# Patient Record
Sex: Female | Born: 1940 | Race: White | Hispanic: No | Marital: Married | Smoking: Former smoker
Health system: Southern US, Community
[De-identification: ages and names within clinical notes are randomized; demographics above are authoritative.]

## PROBLEM LIST (undated history)

## (undated) DIAGNOSIS — M169 Osteoarthritis of hip, unspecified: Secondary | ICD-10-CM

## (undated) DIAGNOSIS — Z5189 Encounter for other specified aftercare: Secondary | ICD-10-CM

## (undated) DIAGNOSIS — F411 Generalized anxiety disorder: Secondary | ICD-10-CM

## (undated) DIAGNOSIS — Z8679 Personal history of other diseases of the circulatory system: Secondary | ICD-10-CM

## (undated) DIAGNOSIS — K635 Polyp of colon: Secondary | ICD-10-CM

## (undated) DIAGNOSIS — M6281 Muscle weakness (generalized): Secondary | ICD-10-CM

## (undated) DIAGNOSIS — L309 Dermatitis, unspecified: Secondary | ICD-10-CM

## (undated) DIAGNOSIS — K649 Unspecified hemorrhoids: Secondary | ICD-10-CM

## (undated) DIAGNOSIS — R109 Unspecified abdominal pain: Secondary | ICD-10-CM

## (undated) DIAGNOSIS — S22000A Wedge compression fracture of unspecified thoracic vertebra, initial encounter for closed fracture: Secondary | ICD-10-CM

## (undated) DIAGNOSIS — J189 Pneumonia, unspecified organism: Secondary | ICD-10-CM

## (undated) DIAGNOSIS — Z9981 Dependence on supplemental oxygen: Secondary | ICD-10-CM

## (undated) DIAGNOSIS — J449 Chronic obstructive pulmonary disease, unspecified: Principal | ICD-10-CM

## (undated) DIAGNOSIS — M76899 Other specified enthesopathies of unspecified lower limb, excluding foot: Secondary | ICD-10-CM

## (undated) DIAGNOSIS — I2510 Atherosclerotic heart disease of native coronary artery without angina pectoris: Secondary | ICD-10-CM

## (undated) DIAGNOSIS — L719 Rosacea, unspecified: Secondary | ICD-10-CM

## (undated) DIAGNOSIS — M545 Low back pain: Secondary | ICD-10-CM

## (undated) DIAGNOSIS — R51 Headache: Secondary | ICD-10-CM

## (undated) DIAGNOSIS — R55 Syncope and collapse: Secondary | ICD-10-CM

## (undated) DIAGNOSIS — M810 Age-related osteoporosis without current pathological fracture: Secondary | ICD-10-CM

## (undated) DIAGNOSIS — I10 Essential (primary) hypertension: Secondary | ICD-10-CM

## (undated) DIAGNOSIS — F329 Major depressive disorder, single episode, unspecified: Secondary | ICD-10-CM

## (undated) DIAGNOSIS — R7302 Impaired glucose tolerance (oral): Secondary | ICD-10-CM

## (undated) HISTORY — DX: Osteoarthritis of hip, unspecified: M16.9

## (undated) HISTORY — DX: Unspecified abdominal pain: R10.9

## (undated) HISTORY — DX: Chronic obstructive pulmonary disease, unspecified: J44.9

## (undated) HISTORY — DX: Polyp of colon: K63.5

## (undated) HISTORY — DX: Rosacea, unspecified: L71.9

## (undated) HISTORY — PX: CORONARY STENT PLACEMENT: SHX1402

## (undated) HISTORY — PX: KYPHOPLASTY: SHX5884

## (undated) HISTORY — DX: Impaired glucose tolerance (oral): R73.02

## (undated) HISTORY — DX: Muscle weakness (generalized): M62.81

## (undated) HISTORY — DX: Major depressive disorder, single episode, unspecified: F32.9

## (undated) HISTORY — DX: Headache: R51

## (undated) HISTORY — DX: Low back pain: M54.5

## (undated) HISTORY — DX: Encounter for other specified aftercare: Z51.89

## (undated) HISTORY — DX: Pneumonia, unspecified organism: J18.9

## (undated) HISTORY — DX: Generalized anxiety disorder: F41.1

## (undated) HISTORY — DX: Personal history of other diseases of the circulatory system: Z86.79

## (undated) HISTORY — PX: OOPHORECTOMY: SHX86

## (undated) HISTORY — PX: ABDOMINAL HYSTERECTOMY: SHX81

## (undated) HISTORY — DX: Dermatitis, unspecified: L30.9

## (undated) HISTORY — DX: Syncope and collapse: R55

## (undated) HISTORY — PX: BACK SURGERY: SHX140

## (undated) HISTORY — PX: CORONARY ANGIOPLASTY: SHX604

## (undated) HISTORY — DX: Other specified enthesopathies of unspecified lower limb, excluding foot: M76.899

## (undated) HISTORY — DX: Unspecified hemorrhoids: K64.9

## (undated) HISTORY — DX: Wedge compression fracture of unspecified thoracic vertebra, initial encounter for closed fracture: S22.000A

## (undated) HISTORY — PX: APPENDECTOMY: SHX54

## (undated) HISTORY — PX: OTHER SURGICAL HISTORY: SHX169

## (undated) HISTORY — PX: BREAST ENHANCEMENT SURGERY: SHX7

## (undated) HISTORY — DX: Age-related osteoporosis without current pathological fracture: M81.0

## (undated) HISTORY — PX: HIP SURGERY: SHX245

## (undated) HISTORY — DX: Essential (primary) hypertension: I10

---

## 1996-05-24 DIAGNOSIS — J189 Pneumonia, unspecified organism: Secondary | ICD-10-CM

## 1996-05-24 HISTORY — DX: Pneumonia, unspecified organism: J18.9

## 2000-02-29 ENCOUNTER — Emergency Department (HOSPITAL_COMMUNITY): Admission: EM | Admit: 2000-02-29 | Discharge: 2000-02-29 | Payer: Self-pay | Admitting: Emergency Medicine

## 2000-02-29 ENCOUNTER — Encounter: Payer: Self-pay | Admitting: Emergency Medicine

## 2000-03-21 ENCOUNTER — Ambulatory Visit (HOSPITAL_COMMUNITY): Admission: RE | Admit: 2000-03-21 | Discharge: 2000-03-21 | Payer: Self-pay | Admitting: Neurosurgery

## 2000-03-21 ENCOUNTER — Encounter: Payer: Self-pay | Admitting: Neurosurgery

## 2000-06-27 ENCOUNTER — Ambulatory Visit (HOSPITAL_COMMUNITY): Admission: RE | Admit: 2000-06-27 | Discharge: 2000-06-27 | Payer: Self-pay | Admitting: Internal Medicine

## 2001-02-22 ENCOUNTER — Ambulatory Visit (HOSPITAL_COMMUNITY): Admission: RE | Admit: 2001-02-22 | Discharge: 2001-02-22 | Payer: Self-pay | Admitting: Internal Medicine

## 2001-06-15 ENCOUNTER — Ambulatory Visit (HOSPITAL_COMMUNITY): Admission: RE | Admit: 2001-06-15 | Discharge: 2001-06-15 | Payer: Self-pay | Admitting: Orthopedic Surgery

## 2001-06-15 ENCOUNTER — Encounter: Payer: Self-pay | Admitting: Orthopedic Surgery

## 2001-07-27 ENCOUNTER — Observation Stay (HOSPITAL_COMMUNITY): Admission: RE | Admit: 2001-07-27 | Discharge: 2001-07-28 | Payer: Self-pay | Admitting: Orthopedic Surgery

## 2001-07-27 ENCOUNTER — Encounter: Payer: Self-pay | Admitting: Orthopedic Surgery

## 2001-08-17 ENCOUNTER — Inpatient Hospital Stay (HOSPITAL_COMMUNITY): Admission: EM | Admit: 2001-08-17 | Discharge: 2001-08-20 | Payer: Self-pay | Admitting: Emergency Medicine

## 2001-08-17 ENCOUNTER — Encounter: Payer: Self-pay | Admitting: Orthopedic Surgery

## 2001-10-14 ENCOUNTER — Encounter: Payer: Self-pay | Admitting: Medical Genetics

## 2001-10-14 ENCOUNTER — Ambulatory Visit (HOSPITAL_COMMUNITY): Admission: RE | Admit: 2001-10-14 | Discharge: 2001-10-14 | Payer: Self-pay | Admitting: Medical Genetics

## 2002-01-09 ENCOUNTER — Ambulatory Visit (HOSPITAL_COMMUNITY): Admission: RE | Admit: 2002-01-09 | Discharge: 2002-01-09 | Payer: Self-pay | Admitting: Internal Medicine

## 2002-01-09 ENCOUNTER — Encounter: Payer: Self-pay | Admitting: Internal Medicine

## 2002-01-25 HISTORY — PX: COLONOSCOPY: SHX174

## 2003-01-11 ENCOUNTER — Ambulatory Visit (HOSPITAL_COMMUNITY): Admission: RE | Admit: 2003-01-11 | Discharge: 2003-01-11 | Payer: Self-pay | Admitting: Internal Medicine

## 2004-05-27 ENCOUNTER — Ambulatory Visit: Payer: Self-pay | Admitting: Internal Medicine

## 2005-02-05 ENCOUNTER — Ambulatory Visit: Payer: Self-pay | Admitting: Internal Medicine

## 2005-02-17 ENCOUNTER — Ambulatory Visit: Payer: Self-pay | Admitting: Internal Medicine

## 2005-02-17 HISTORY — PX: COLONOSCOPY: SHX174

## 2005-05-31 ENCOUNTER — Ambulatory Visit: Payer: Self-pay | Admitting: Internal Medicine

## 2005-06-12 ENCOUNTER — Ambulatory Visit: Payer: Self-pay | Admitting: Internal Medicine

## 2005-06-12 ENCOUNTER — Inpatient Hospital Stay (HOSPITAL_COMMUNITY): Admission: EM | Admit: 2005-06-12 | Discharge: 2005-06-15 | Payer: Self-pay | Admitting: *Deleted

## 2005-06-12 ENCOUNTER — Ambulatory Visit: Payer: Self-pay | Admitting: Cardiology

## 2005-06-14 ENCOUNTER — Encounter: Payer: Self-pay | Admitting: Cardiology

## 2005-07-08 ENCOUNTER — Ambulatory Visit: Payer: Self-pay | Admitting: Internal Medicine

## 2005-10-28 ENCOUNTER — Ambulatory Visit: Payer: Self-pay | Admitting: Internal Medicine

## 2005-11-18 ENCOUNTER — Ambulatory Visit: Payer: Self-pay | Admitting: Internal Medicine

## 2006-06-14 ENCOUNTER — Ambulatory Visit: Payer: Self-pay | Admitting: Internal Medicine

## 2006-06-14 ENCOUNTER — Ambulatory Visit (HOSPITAL_COMMUNITY): Admission: RE | Admit: 2006-06-14 | Discharge: 2006-06-14 | Payer: Self-pay | Admitting: Internal Medicine

## 2006-06-20 ENCOUNTER — Ambulatory Visit: Payer: Self-pay | Admitting: Internal Medicine

## 2006-06-20 ENCOUNTER — Ambulatory Visit (HOSPITAL_COMMUNITY): Admission: RE | Admit: 2006-06-20 | Discharge: 2006-06-20 | Payer: Self-pay | Admitting: Internal Medicine

## 2006-06-20 LAB — CONVERTED CEMR LAB
ALT: 18 units/L (ref 0–40)
CO2: 31 meq/L (ref 19–32)
Calcium: 8.8 mg/dL (ref 8.4–10.5)
Chloride: 107 meq/L (ref 96–112)
Cholesterol: 235 mg/dL (ref 0–200)
Eosinophils Absolute: 0.3 10*3/uL (ref 0.0–0.6)
Eosinophils Relative: 4.1 % (ref 0.0–5.0)
Glucose, Bld: 91 mg/dL (ref 70–99)
Hemoglobin: 13.4 g/dL (ref 12.0–15.0)
Leukocytes, UA: NEGATIVE
MCV: 82.4 fL (ref 78.0–100.0)
Monocytes Absolute: 0.5 10*3/uL (ref 0.2–0.7)
Monocytes Relative: 7.6 % (ref 3.0–11.0)
Neutro Abs: 2.4 10*3/uL (ref 1.4–7.7)
Neutrophils Relative %: 39 % — ABNORMAL LOW (ref 43.0–77.0)
RBC: 4.69 M/uL (ref 3.87–5.11)
RDW: 13 % (ref 11.5–14.6)
Sodium: 143 meq/L (ref 135–145)
Total CHOL/HDL Ratio: 4.8
Total Protein, Urine: NEGATIVE mg/dL
Total Protein: 6.2 g/dL (ref 6.0–8.3)
Triglycerides: 320 mg/dL (ref 0–149)
Urine Glucose: NEGATIVE mg/dL
Urobilinogen, UA: 0.2 (ref 0.0–1.0)

## 2006-07-05 ENCOUNTER — Ambulatory Visit: Payer: Self-pay | Admitting: Internal Medicine

## 2006-07-11 ENCOUNTER — Ambulatory Visit: Payer: Self-pay | Admitting: Internal Medicine

## 2006-08-01 ENCOUNTER — Ambulatory Visit: Payer: Self-pay | Admitting: Internal Medicine

## 2007-01-01 DIAGNOSIS — F411 Generalized anxiety disorder: Secondary | ICD-10-CM

## 2007-01-01 DIAGNOSIS — M545 Low back pain, unspecified: Secondary | ICD-10-CM

## 2007-01-01 DIAGNOSIS — I10 Essential (primary) hypertension: Secondary | ICD-10-CM

## 2007-01-01 DIAGNOSIS — F418 Other specified anxiety disorders: Secondary | ICD-10-CM

## 2007-01-01 DIAGNOSIS — M810 Age-related osteoporosis without current pathological fracture: Secondary | ICD-10-CM | POA: Insufficient documentation

## 2007-01-01 DIAGNOSIS — J439 Emphysema, unspecified: Secondary | ICD-10-CM | POA: Insufficient documentation

## 2007-01-01 DIAGNOSIS — R55 Syncope and collapse: Secondary | ICD-10-CM

## 2007-01-01 DIAGNOSIS — R51 Headache: Secondary | ICD-10-CM

## 2007-01-01 DIAGNOSIS — E785 Hyperlipidemia, unspecified: Secondary | ICD-10-CM | POA: Insufficient documentation

## 2007-01-01 DIAGNOSIS — Z8679 Personal history of other diseases of the circulatory system: Secondary | ICD-10-CM | POA: Insufficient documentation

## 2007-01-01 DIAGNOSIS — F329 Major depressive disorder, single episode, unspecified: Secondary | ICD-10-CM

## 2007-01-01 DIAGNOSIS — J449 Chronic obstructive pulmonary disease, unspecified: Secondary | ICD-10-CM

## 2007-01-01 DIAGNOSIS — J4489 Other specified chronic obstructive pulmonary disease: Secondary | ICD-10-CM

## 2007-01-01 DIAGNOSIS — R519 Headache, unspecified: Secondary | ICD-10-CM | POA: Insufficient documentation

## 2007-01-01 DIAGNOSIS — F3289 Other specified depressive episodes: Secondary | ICD-10-CM

## 2007-01-01 HISTORY — DX: Chronic obstructive pulmonary disease, unspecified: J44.9

## 2007-01-01 HISTORY — DX: Generalized anxiety disorder: F41.1

## 2007-01-01 HISTORY — DX: Other specified chronic obstructive pulmonary disease: J44.89

## 2007-01-01 HISTORY — DX: Age-related osteoporosis without current pathological fracture: M81.0

## 2007-01-01 HISTORY — DX: Essential (primary) hypertension: I10

## 2007-01-01 HISTORY — DX: Low back pain, unspecified: M54.50

## 2007-01-01 HISTORY — DX: Other specified depressive episodes: F32.89

## 2007-01-01 HISTORY — DX: Major depressive disorder, single episode, unspecified: F32.9

## 2007-01-01 HISTORY — DX: Headache: R51

## 2007-01-01 HISTORY — DX: Personal history of other diseases of the circulatory system: Z86.79

## 2007-01-01 HISTORY — DX: Syncope and collapse: R55

## 2007-07-28 ENCOUNTER — Ambulatory Visit (HOSPITAL_COMMUNITY): Admission: RE | Admit: 2007-07-28 | Discharge: 2007-07-28 | Payer: Self-pay | Admitting: Internal Medicine

## 2007-08-17 ENCOUNTER — Ambulatory Visit: Payer: Self-pay | Admitting: Internal Medicine

## 2007-08-17 DIAGNOSIS — E739 Lactose intolerance, unspecified: Secondary | ICD-10-CM

## 2007-08-18 LAB — CONVERTED CEMR LAB
AST: 21 units/L (ref 0–37)
Albumin: 3.4 g/dL — ABNORMAL LOW (ref 3.5–5.2)
BUN: 5 mg/dL — ABNORMAL LOW (ref 6–23)
Basophils Relative: 0.6 % (ref 0.0–1.0)
Bilirubin Urine: NEGATIVE
Bilirubin, Direct: 0.1 mg/dL (ref 0.0–0.3)
Calcium: 8.7 mg/dL (ref 8.4–10.5)
Chloride: 106 meq/L (ref 96–112)
Cholesterol: 282 mg/dL (ref 0–200)
Creatinine, Ser: 0.8 mg/dL (ref 0.4–1.2)
Eosinophils Absolute: 0.3 10*3/uL (ref 0.0–0.6)
GFR calc Af Amer: 92 mL/min
GFR calc non Af Amer: 76 mL/min
HCT: 38.8 % (ref 36.0–46.0)
HDL: 51.3 mg/dL (ref >39.0)
Hemoglobin: 12.8 g/dL (ref 12.0–15.0)
Lymphocytes Relative: 44 % (ref 12.0–46.0)
MCHC: 33 g/dL (ref 30.0–36.0)
MCV: 81.5 fL (ref 78.0–100.0)
Monocytes Absolute: 0.5 10*3/uL (ref 0.1–1.0)
Neutro Abs: 3 10*3/uL (ref 1.4–7.7)
Neutrophils Relative %: 43.6 % (ref 43.0–77.0)
Platelets: 284 10*3/uL (ref 150–400)
Potassium: 4 meq/L (ref 3.5–5.1)
RBC: 4.76 M/uL (ref 3.87–5.11)
RDW: 12.5 % (ref 11.5–14.6)
Sodium: 143 meq/L (ref 135–145)
TSH: 1.9 microintl units/mL (ref 0.35–5.50)
Total Bilirubin: 0.4 mg/dL (ref 0.3–1.2)
Total Protein: 6.3 g/dL (ref 6.0–8.3)
Urine Glucose: NEGATIVE mg/dL
Urobilinogen, UA: 0.2 (ref 0.0–1.0)
VLDL: 75 mg/dL — ABNORMAL HIGH (ref 0–40)

## 2007-09-13 ENCOUNTER — Telehealth (INDEPENDENT_AMBULATORY_CARE_PROVIDER_SITE_OTHER): Payer: Self-pay | Admitting: *Deleted

## 2008-03-19 ENCOUNTER — Telehealth (INDEPENDENT_AMBULATORY_CARE_PROVIDER_SITE_OTHER): Payer: Self-pay | Admitting: *Deleted

## 2008-03-22 ENCOUNTER — Ambulatory Visit: Payer: Self-pay | Admitting: Internal Medicine

## 2008-05-02 ENCOUNTER — Ambulatory Visit: Payer: Self-pay | Admitting: Internal Medicine

## 2008-05-24 HISTORY — PX: OTHER SURGICAL HISTORY: SHX169

## 2008-06-20 ENCOUNTER — Ambulatory Visit: Payer: Self-pay | Admitting: Internal Medicine

## 2008-06-20 DIAGNOSIS — R109 Unspecified abdominal pain: Secondary | ICD-10-CM

## 2008-06-20 HISTORY — DX: Unspecified abdominal pain: R10.9

## 2008-06-21 ENCOUNTER — Telehealth: Payer: Self-pay | Admitting: Internal Medicine

## 2008-06-24 ENCOUNTER — Telehealth (INDEPENDENT_AMBULATORY_CARE_PROVIDER_SITE_OTHER): Payer: Self-pay | Admitting: *Deleted

## 2008-07-01 ENCOUNTER — Telehealth (INDEPENDENT_AMBULATORY_CARE_PROVIDER_SITE_OTHER): Payer: Self-pay | Admitting: *Deleted

## 2008-07-01 DIAGNOSIS — M1610 Unilateral primary osteoarthritis, unspecified hip: Secondary | ICD-10-CM

## 2008-07-01 DIAGNOSIS — M169 Osteoarthritis of hip, unspecified: Secondary | ICD-10-CM

## 2008-07-01 HISTORY — DX: Unilateral primary osteoarthritis, unspecified hip: M16.10

## 2008-07-01 HISTORY — DX: Osteoarthritis of hip, unspecified: M16.9

## 2008-07-18 ENCOUNTER — Encounter: Admission: RE | Admit: 2008-07-18 | Discharge: 2008-07-18 | Payer: Self-pay | Admitting: Sports Medicine

## 2008-07-19 ENCOUNTER — Ambulatory Visit: Payer: Self-pay | Admitting: Internal Medicine

## 2008-07-19 LAB — CONVERTED CEMR LAB
AST: 20 units/L (ref 0–37)
Albumin: 3.5 g/dL (ref 3.5–5.2)
Bilirubin, Direct: 0.1 mg/dL (ref 0.0–0.3)
Cholesterol: 166 mg/dL (ref 0–200)

## 2008-08-08 ENCOUNTER — Ambulatory Visit: Payer: Self-pay | Admitting: Family Medicine

## 2008-08-08 ENCOUNTER — Encounter: Payer: Self-pay | Admitting: Internal Medicine

## 2009-01-07 ENCOUNTER — Encounter (INDEPENDENT_AMBULATORY_CARE_PROVIDER_SITE_OTHER): Payer: Self-pay | Admitting: Internal Medicine

## 2009-01-07 ENCOUNTER — Inpatient Hospital Stay (HOSPITAL_COMMUNITY): Admission: EM | Admit: 2009-01-07 | Discharge: 2009-01-09 | Payer: Self-pay | Admitting: Emergency Medicine

## 2009-01-07 ENCOUNTER — Ambulatory Visit: Payer: Self-pay | Admitting: Internal Medicine

## 2009-01-15 ENCOUNTER — Ambulatory Visit: Payer: Self-pay

## 2009-01-15 ENCOUNTER — Ambulatory Visit: Payer: Self-pay | Admitting: Internal Medicine

## 2009-01-15 DIAGNOSIS — I2510 Atherosclerotic heart disease of native coronary artery without angina pectoris: Secondary | ICD-10-CM

## 2009-01-15 DIAGNOSIS — R072 Precordial pain: Secondary | ICD-10-CM

## 2009-01-15 DIAGNOSIS — R0989 Other specified symptoms and signs involving the circulatory and respiratory systems: Secondary | ICD-10-CM

## 2009-01-15 DIAGNOSIS — I10 Essential (primary) hypertension: Secondary | ICD-10-CM | POA: Insufficient documentation

## 2009-01-20 ENCOUNTER — Encounter: Payer: Self-pay | Admitting: Internal Medicine

## 2009-01-23 ENCOUNTER — Ambulatory Visit: Payer: Self-pay | Admitting: Internal Medicine

## 2009-02-03 ENCOUNTER — Encounter: Payer: Self-pay | Admitting: Internal Medicine

## 2009-02-07 ENCOUNTER — Telehealth: Payer: Self-pay | Admitting: Internal Medicine

## 2009-02-13 ENCOUNTER — Ambulatory Visit: Payer: Self-pay | Admitting: Internal Medicine

## 2009-02-17 LAB — CONVERTED CEMR LAB
Basophils Absolute: 0.1 10*3/uL (ref 0.0–0.1)
Basophils Relative: 0.7 % (ref 0.0–3.0)
Eosinophils Absolute: 0.5 10*3/uL (ref 0.0–0.7)
Eosinophils Relative: 7.2 % — ABNORMAL HIGH (ref 0.0–5.0)
HCT: 38.3 % (ref 36.0–46.0)
Hemoglobin: 13 g/dL (ref 12.0–15.0)
Lymphocytes Relative: 42.9 % (ref 12.0–46.0)
MCV: 85.2 fL (ref 78.0–100.0)
Neutrophils Relative %: 42.3 % — ABNORMAL LOW (ref 43.0–77.0)
Platelets: 253 10*3/uL (ref 150.0–400.0)
RBC: 4.49 M/uL (ref 3.87–5.11)
RDW: 12.4 % (ref 11.5–14.6)
T3, Free: 2.1 pg/mL — ABNORMAL LOW (ref 2.3–4.2)
TSH: 2.58 microintl units/mL (ref 0.35–5.50)

## 2009-02-20 ENCOUNTER — Encounter: Payer: Self-pay | Admitting: Cardiology

## 2009-02-20 ENCOUNTER — Ambulatory Visit: Payer: Self-pay

## 2009-05-21 ENCOUNTER — Encounter: Payer: Self-pay | Admitting: Internal Medicine

## 2009-05-21 ENCOUNTER — Encounter: Admission: RE | Admit: 2009-05-21 | Discharge: 2009-05-21 | Payer: Self-pay | Admitting: Anesthesiology

## 2009-05-30 ENCOUNTER — Ambulatory Visit: Payer: Self-pay | Admitting: Internal Medicine

## 2009-05-30 LAB — CONVERTED CEMR LAB
AST: 23 units/L (ref 0–37)
Basophils Absolute: 0.1 10*3/uL (ref 0.0–0.1)
Basophils Relative: 0.8 % (ref 0.0–3.0)
Bilirubin Urine: NEGATIVE
Bilirubin, Direct: 0.1 mg/dL (ref 0.0–0.3)
CO2: 31 meq/L (ref 19–32)
Calcium: 9.1 mg/dL (ref 8.4–10.5)
Creatinine, Ser: 0.9 mg/dL (ref 0.4–1.2)
Eosinophils Relative: 9.4 % — ABNORMAL HIGH (ref 0.0–5.0)
Glucose, Bld: 86 mg/dL (ref 70–99)
HCT: 39.9 % (ref 36.0–46.0)
HDL: 60 mg/dL (ref >39.00)
Lymphocytes Relative: 38.6 % (ref 12.0–46.0)
MCHC: 32.5 g/dL (ref 30.0–36.0)
Neutro Abs: 2.8 10*3/uL (ref 1.4–7.7)
Neutrophils Relative %: 42.7 % — ABNORMAL LOW (ref 43.0–77.0)
Platelets: 236 10*3/uL (ref 150.0–400.0)
Sodium: 145 meq/L (ref 135–145)
Specific Gravity, Urine: <=1.005 (ref 1.000–1.030)
Total Bilirubin: 0.6 mg/dL (ref 0.3–1.2)
Urine Glucose: NEGATIVE mg/dL

## 2009-06-04 ENCOUNTER — Ambulatory Visit: Payer: Self-pay | Admitting: Internal Medicine

## 2009-06-04 DIAGNOSIS — M6281 Muscle weakness (generalized): Secondary | ICD-10-CM | POA: Insufficient documentation

## 2009-06-04 DIAGNOSIS — M76899 Other specified enthesopathies of unspecified lower limb, excluding foot: Secondary | ICD-10-CM

## 2009-06-04 HISTORY — DX: Muscle weakness (generalized): M62.81

## 2009-06-04 HISTORY — DX: Other specified enthesopathies of unspecified lower limb, excluding foot: M76.899

## 2009-07-29 ENCOUNTER — Ambulatory Visit: Payer: Self-pay | Admitting: Internal Medicine

## 2010-01-05 ENCOUNTER — Encounter (INDEPENDENT_AMBULATORY_CARE_PROVIDER_SITE_OTHER): Payer: Self-pay | Admitting: *Deleted

## 2010-01-26 ENCOUNTER — Encounter: Payer: Self-pay | Admitting: Internal Medicine

## 2010-01-26 ENCOUNTER — Inpatient Hospital Stay (HOSPITAL_COMMUNITY): Admission: EM | Admit: 2010-01-26 | Discharge: 2010-01-28 | Payer: Self-pay | Admitting: Emergency Medicine

## 2010-01-26 ENCOUNTER — Ambulatory Visit: Payer: Self-pay | Admitting: Cardiology

## 2010-02-10 ENCOUNTER — Ambulatory Visit: Payer: Self-pay | Admitting: Internal Medicine

## 2010-02-13 ENCOUNTER — Telehealth: Payer: Self-pay | Admitting: Internal Medicine

## 2010-02-17 ENCOUNTER — Encounter: Payer: Self-pay | Admitting: Internal Medicine

## 2010-02-23 ENCOUNTER — Ambulatory Visit: Payer: Self-pay | Admitting: Internal Medicine

## 2010-02-25 ENCOUNTER — Encounter: Payer: Self-pay | Admitting: Internal Medicine

## 2010-03-19 ENCOUNTER — Encounter: Payer: Self-pay | Admitting: Internal Medicine

## 2010-03-20 ENCOUNTER — Encounter: Payer: Self-pay | Admitting: Internal Medicine

## 2010-06-02 ENCOUNTER — Ambulatory Visit: Admit: 2010-06-02 | Payer: Self-pay | Admitting: Internal Medicine

## 2010-06-21 LAB — CONVERTED CEMR LAB
Albumin: 3.6 g/dL (ref 3.5–5.2)
Alkaline Phosphatase: 75 units/L (ref 39–117)
BUN: 7 mg/dL (ref 6–23)
Basophils Absolute: 0.1 10*3/uL (ref 0.0–0.1)
Basophils Relative: 0.7 % (ref 0.0–3.0)
Bilirubin Urine: NEGATIVE
Bilirubin, Direct: 0.1 mg/dL (ref 0.0–0.3)
Eosinophils Absolute: 0.5 10*3/uL (ref 0.0–0.7)
GFR calc Af Amer: 81 mL/min
Glucose, Bld: 94 mg/dL (ref 70–99)
Hemoglobin: 12.8 g/dL (ref 12.0–15.0)
MCV: 82.4 fL (ref 78.0–100.0)
Neutro Abs: 3.8 10*3/uL (ref 1.4–7.7)
Platelets: 268 10*3/uL (ref 150–400)
RBC: 4.64 M/uL (ref 3.87–5.11)
RDW: 13 % (ref 11.5–14.6)
Sodium: 142 meq/L (ref 135–145)
TSH: 1.95 microintl units/mL (ref 0.35–5.50)
Total Bilirubin: 0.5 mg/dL (ref 0.3–1.2)
Total CHOL/HDL Ratio: 4.6
Total Protein: 6.5 g/dL (ref 6.0–8.3)
Triglycerides: 249 mg/dL (ref 0–149)
VLDL: 50 mg/dL — ABNORMAL HIGH (ref 0–40)
pH: 5.5 (ref 5.0–8.0)

## 2010-06-23 NOTE — Progress Notes (Signed)
Summary: Prolia  Phone Note Outgoing Call   Summary of Call: Faxed Prolia form today.  Will wait for verification of benefits.Marland KitchenMarland KitchenLanier Prude, [facility])  February 13, 2010 8:11 AM   Follow-up for Phone Call        Rec fax back from [employer] stating med is not covered. I called Jill(@[email or web address] ) at [phone].  She is going to reverify pt's part B benefits and clarify whether Prolia is covered. Follow-up by: Lanier Prude, [facility]),  February 20, 2010 10:02 AM  Additional Follow-up for Phone Call Additional follow up Details #1::        Received fax back stating out of pocket cost for pt will be 20% around $180.00. pt informed of above and states she needs to discuss this with her husband and call us if/when she decides to proceed. Additional Follow-up by: Lanier Prude, [facility]),  February 25, 2010 2:55 PM    Additional Follow-up for Phone Call Additional follow up Details #2::    noted Follow-up by: Corwin Levins MD,  February 25, 2010 4:54 PM

## 2010-06-23 NOTE — Assessment & Plan Note (Signed)
Summary: f72m    Visit Type:  6 months follow up Primary Provider:  Oliver Barre  CC:  No cardiac complains.  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 S/p PCI LAD in 8/10 stented with BMS.  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   Here for f/u.  No CP. Continues with mild dyspnea. Improved with Spiriva and albuterol as needed. Now off Plavix. No headache or other neuro symptoms. Feels like strength in her legs is getting weaker. Had MRI with pain management team and no obvious cause except for DDD. No frank claudication. REcent ABIs with mildly reduced flow in R SFA.  Current Medications (verified): 1)  Lisinopril 10 Mg  Tabs (Lisinopril) .Marland Kitchen.. 1 By Mouth Qd 2)  Alendronate Sodium 70 Mg  Tabs (Alendronate Sodium) .Marland Kitchen.. 1 By Mouth Q Week 3)  Amitriptyline Hcl 10 Mg  Tabs (Amitriptyline Hcl) .... 2 By Mouth Q Hs 4)  Tramadol Hcl 50 Mg  Tabs (Tramadol Hcl) .... 2 By Mouth Q 6 Hrs. 5)  Promethazine Hcl 25 Mg  Supp (Promethazine Hcl) .Marland Kitchen.. 1 By Mouth W/ Meals 6)  Proventil Hfa 108 (90 Base) Mcg/act  Aers (Albuterol Sulfate) .... Use As Directed 7)  Simvastatin 40 Mg Tabs (Simvastatin) .... 1/2 By Mouth Once Daily 8)  Aspirin 81 Mg Tabs (Aspirin) .... Once Daily 9)  Proventil Hfa 108 (90 Base) Mcg/act Aers (Albuterol Sulfate) .... As Needed 10)  Spiriva Handihaler 18 Mcg Caps (Tiotropium Bromide Monohydrate) .... Every Other Day 11)  Vitamin D 1000 Unit Tabs (Cholecalciferol) .Marland Kitchen.. 1 By Mouth Once Daily  Allergies: 1)  ! Codeine 2)  Asa 3)  * Anticoagulants  Past History:  Past Medical History: Last updated: 01/15/2009 CAD   --s/p BMS stent to LAD 8/10  Hypertension Osteoporosis Low back pain, recurrent LS Spine DDD/chronic right foot drop Depression Hyperlipidemia Headache Syncope Anxiety COPD Transient ischemic attack, hx of hx of small cerebral hemorrhage from 2001 glucose  intolerance hx of neurocardiogenic syncope - dr Graciela Husbands right sciatica/recurrent   Review of Systems       As per HPI and past medical history; otherwise all systems negative.   Vital Signs:  Patient profile:   70 year old female Height:      65 inches Weight:      138.75 pounds BMI:     23.17 Pulse rate:   99 / minute Pulse rhythm:   regular Resp:     18 per minute BP sitting:   106 / 70  (left arm) Cuff size:   large  Vitals Entered By: Vikki Ports (July 29, 2009 11:47 AM)  Physical Exam  General:  Well apperaing.  no resp difficulty HEENT: normal Neck: supple. no JVD. Carotids 2+ bilat; no bruits. No lymphadenopathy or thryomegaly appreciated. Cor: PMI nondisplaced. Regular rate & rhythm. No rubs, murmur. +s4 Lungs: clear. with decreased breath soundss throughout Abdomen: soft, nontender, nondistended.  Good bowel sounds. Extremities: no cyanosis, clubbing, rash, edema.   Neuro: alert & orientedx3, cranial nerves grossly intact. moves all 4 extremities w/o difficulty. affect pleasant    Impression & Recommendations:  Problem # 1:  CAD, NATIVE VESSEL (ICD-414.01) Stable. No evidence of ischemia. Continue current regimen.  Problem # 2:  HYPERTENSION, BENIGN (ICD-401.1) Blood pressure well controlled. Continue current regimen.  Patient Instructions: 1)  Your physician recommends that you schedule a follow-up appointment in: 12 months

## 2010-06-23 NOTE — Medication Information (Signed)
Summary: Benefits/Prolia  Benefits/Prolia   Imported By: Lester [place] 02/23/2010 08:32:14  _____________________________________________________________________  External Attachment:    Type:   Image     Comment:   External Document

## 2010-06-23 NOTE — Assessment & Plan Note (Signed)
Summary: post hosp/clogged stent/lb   Vital Signs:  Patient profile:   71 year old female Height:      66 inches Weight:      137.25 pounds BMI:     22.23 O2 Sat:      94 % on Room air Temp:     98.3 degrees F oral Pulse rate:   71 / minute BP sitting:   100 / 62  (left arm) Cuff size:   regular  Vitals Entered By: Zella Ball Ewing CMA [facility]) (February 10, 2010 2:37 PM)  O2 Flow:  Room air CC: Post Hospital/RE   Primary Care Provider:  Oliver Barre  CC:  Post Hospital/RE.  History of Present Illness: here for f/u - s/p recent hospn resulting in replacement stent , with the addition of plavix 75, metoprolol 25  - 1/2 two times a day, and incr asa to 325 mg once daily;  plan is for plavix for 12 months. Post d/c pt states overall doing quite nicely - Pt denies CP, worsening sob, doe, wheezing, orthopnea, pnd, worsening LE edema, palps, dizziness or syncope  Pt denies new neuro symptoms such as headache, facial or extremity weakness  No fever, wt loss, night sweats, loss of appetite or other constitutional symptoms  Denies new polydipsia, polyuria.  No overt bleeding though she did inadvertently scratch the left heel this am with minor blood loss that seemed longer to stop.  Has several new bruises to the arms, and recent IV sites.  No orthostasis, and husbnad somewhat concerned about taking the metoprolol and the ace with the BP relatively low today.  Pt herself states overall ambulatory ability , with no change in some baseline frailty and small balance problem.  No falls and does not feel she is worse risk on her new meds based on her subjective assessment.  No other new complaints.  Due for flu shot today.  Also interested in Prolia in lieu of the alendronate if cost of copay not prohibiive.  Most recent dxa reviewed with pt.  Also d/w pt colonoscopy for which she is now due, but declines on the plavix, might re-consider if able to be off the plavix at later date.  Problems Prior to  Update: 1)  Bursitis, Right Hip  (ICD-726.5) 2)  Muscle Weakness (GENERALIZED)  (ICD-728.87) 3)  Preventive Health Care  (ICD-V70.0) 4)  COPD  (ICD-496) 5)  Bruit  (ICD-785.9) 6)  Cad, Native Vessel  (ICD-414.01) 7)  Other Symptoms Involving Cardiovascular System  (ICD-785.9) 8)  Bruit  (ICD-785.9) 9)  Hypertension, Benign  (ICD-401.1) 10)  Chest Pain-precordial  (ICD-786.51) 11)  Osteoarthritis, Hip  (ICD-715.95) 12)  Groin Pain  (ICD-789.09) 13)  Preventive Health Care  (ICD-V70.0) 14)  Preventive Health Care  (ICD-V70.0) 15)  Glucose Intolerance  (ICD-271.3) 16)  Transient Ischemic Attack, Hx of  (ICD-V12.50) 17)  COPD  (ICD-496) 18)  Anxiety  (ICD-300.00) 19)  Syncope  (ICD-780.2) 20)  Headache  (ICD-784.0) 21)  Hyperlipidemia  (ICD-272.4) 22)  Depression  (ICD-311) 23)  Low Back Pain  (ICD-724.2) 24)  Osteoporosis  (ICD-733.00) 25)  Hypertension  (ICD-401.9)  Medications Prior to Update: 1)  Lisinopril 10 Mg  Tabs (Lisinopril) .Marland Kitchen.. 1 By Mouth Qd 2)  Alendronate Sodium 70 Mg  Tabs (Alendronate Sodium) .Marland Kitchen.. 1 By Mouth Q Week 3)  Amitriptyline Hcl 10 Mg  Tabs (Amitriptyline Hcl) .... 2 By Mouth Q Hs 4)  Tramadol Hcl 50 Mg  Tabs (Tramadol Hcl) .... 2 By Mouth  Q 6 Hrs. 5)  Promethazine Hcl 25 Mg  Supp (Promethazine Hcl) .Marland Kitchen.. 1 By Mouth W/ Meals 6)  Proventil Hfa 108 (90 Base) Mcg/act  Aers (Albuterol Sulfate) .... Use As Directed 7)  Simvastatin 40 Mg Tabs (Simvastatin) .... 1/2 By Mouth Once Daily 8)  Aspirin 81 Mg Tabs (Aspirin) .... Once Daily 9)  Proventil Hfa 108 (90 Base) Mcg/act Aers (Albuterol Sulfate) .... As Needed 10)  Spiriva Handihaler 18 Mcg Caps (Tiotropium Bromide Monohydrate) .... Every Other Day 11)  Vitamin D 1000 Unit Tabs (Cholecalciferol) .Marland Kitchen.. 1 By Mouth Once Daily  Current Medications (verified): 1)  Lisinopril 10 Mg  Tabs (Lisinopril) .Marland Kitchen.. 1 By Mouth Qd 2)  Alendronate Sodium 70 Mg  Tabs (Alendronate Sodium) .Marland Kitchen.. 1 By Mouth Q Week 3)  Amitriptyline  Hcl 10 Mg  Tabs (Amitriptyline Hcl) .... 2 By Mouth Q Hs 4)  Tramadol Hcl 50 Mg  Tabs (Tramadol Hcl) .... 2 By Mouth Q 6 Hrs. 5)  Promethazine Hcl 25 Mg  Supp (Promethazine Hcl) .Marland Kitchen.. 1 By Mouth W/ Meals 6)  Proventil Hfa 108 (90 Base) Mcg/act  Aers (Albuterol Sulfate) .... Use As Directed 7)  Simvastatin 40 Mg Tabs (Simvastatin) .... 1/2 By Mouth Once Daily 8)  Aspirin 81 Mg Tabs (Aspirin) .... Once Daily 9)  Proventil Hfa 108 (90 Base) Mcg/act Aers (Albuterol Sulfate) .... As Needed 10)  Spiriva Handihaler 18 Mcg Caps (Tiotropium Bromide Monohydrate) .... Every Other Day 11)  Vitamin D 1000 Unit Tabs (Cholecalciferol) .Marland Kitchen.. 1 By Mouth Once Daily 12)  Aspirin 325 Mg Tabs (Aspirin) .Marland Kitchen.. 1 By Mouth Once Daily 13)  Plavix 75 Mg Tabs (Clopidogrel Bisulfate) .Marland Kitchen.. 1 By Mouth Once Daily 14)  Metoprolol Tartrate 25 Mg Tabs (Metoprolol Tartrate) .... 1/2 By Mouth Two Times A Day  Allergies (verified): 1)  ! Codeine 2)  Asa 3)  * Anticoagulants  Past History:  Past Surgical History: Last updated: 01/23/2009 Appendectomy Hysterectomy Oophorectomy- one ovary s/p lumbar disc surgury - dr Leslee Home s/p bilat cataract 2010  Social History: Last updated: 08/17/2007 Alcohol use-no Married 4 children Former Smoker - quit march 2007 disalbed iwth back since 2003 - prior admin assist  Risk Factors: Smoking Status: quit (08/17/2007)  Past Medical History: CAD   --s/p BMS stent to LAD 8/10  Hypertension Osteoporosis Low back pain, recurrent LS Spine DDD/chronic right foot drop Depression Hyperlipidemia Headache Syncope Anxiety COPD Transient ischemic attack, hx of hx of small cerebral hemorrhage from 2001 glucose intolerance hx of neurocardiogenic syncope - dr Graciela Husbands right sciatica/recurrent   Review of Systems       all otherwise negative per pt -    Physical Exam  General:  alert and underweight appearing.   Head:  normocephalic and atraumatic.   Eyes:  vision grossly  intact, pupils equal, and pupils round.   Ears:  R ear normal and L ear normal.   Nose:  no external deformity and no nasal discharge.   Mouth:  no gingival abnormalities and pharynx pink and moist.   Neck:  supple and no masses.   Lungs:  normal respiratory effort, R decreased breath sounds, and L decreased breath sounds.   Heart:  normal rate and regular rhythm.   Abdomen:  soft and non-tender.   Extremities:  no edema, no erythema    Impression & Recommendations:  Problem # 1:  CAD, NATIVE VESSEL (ICD-414.01)  Her updated medication list for this problem includes:    Lisinopril 10 Mg Tabs (Lisinopril) .Marland KitchenMarland KitchenMarland KitchenMarland Kitchen  1 by mouth qd    Aspirin 81 Mg Tabs (Aspirin) ..... Once daily    Aspirin 325 Mg Tabs (Aspirin) .Marland Kitchen... 1 by mouth once daily    Plavix 75 Mg Tabs (Clopidogrel bisulfate) .Marland Kitchen... 1 by mouth once daily    Metoprolol Tartrate 25 Mg Tabs (Metoprolol tartrate) .Marland Kitchen... 1/2 by mouth two times a day stable overall by hx and exam, ok to continue meds/tx as is ; f/u card as planned  Problem # 2:  COPD (ICD-496)  Her updated medication list for this problem includes:    Proventil Hfa 108 (90 Base) Mcg/act Aers (Albuterol sulfate) ..... Use as directed    Proventil Hfa 108 (90 Base) Mcg/act Aers (Albuterol sulfate) .Marland Kitchen... As needed    Spiriva Handihaler 18 Mcg Caps (Tiotropium bromide monohydrate) ..... Every other day stable overall by hx and exam, ok to continue meds/tx as is   Problem # 3:  HYPERTENSION, BENIGN (ICD-401.1)  Her updated medication list for this problem includes:    Lisinopril 10 Mg Tabs (Lisinopril) .Marland Kitchen... 1 by mouth qd    Metoprolol Tartrate 25 Mg Tabs (Metoprolol tartrate) .Marland Kitchen... 1/2 by mouth two times a day stable overall by hx and exam, ok to continue meds/tx as is  -   BP today: 100/62 Prior BP: 106/70 (07/29/2009)  Labs Reviewed: K+: 4.0 (05/30/2009) Creat: : 0.9 (05/30/2009)   Chol: 146 (05/30/2009)   HDL: 60.00 (05/30/2009)   LDL: 51 (05/30/2009)   TG: 173.0  (05/30/2009)  Problem # 4:  OSTEOPOROSIS (ICD-733.00)  Her updated medication list for this problem includes:    Alendronate Sodium 70 Mg Tabs (Alendronate sodium) .Marland Kitchen... 1 by mouth q week will cont as is for now, but will need to see about copay for prolia as she is interested in change  Complete Medication List: 1)  Lisinopril 10 Mg Tabs (Lisinopril) .Marland Kitchen.. 1 by mouth qd 2)  Alendronate Sodium 70 Mg Tabs (Alendronate sodium) .Marland Kitchen.. 1 by mouth q week 3)  Amitriptyline Hcl 10 Mg Tabs (Amitriptyline hcl) .... 2 by mouth q hs 4)  Tramadol Hcl 50 Mg Tabs (Tramadol hcl) .... 2 by mouth q 6 hrs. 5)  Promethazine Hcl 25 Mg Supp (Promethazine hcl) .Marland Kitchen.. 1 by mouth w/ meals 6)  Proventil Hfa 108 (90 Base) Mcg/act Aers (Albuterol sulfate) .... Use as directed 7)  Simvastatin 40 Mg Tabs (Simvastatin) .... 1/2 by mouth once daily 8)  Aspirin 81 Mg Tabs (Aspirin) .... Once daily 9)  Proventil Hfa 108 (90 Base) Mcg/act Aers (Albuterol sulfate) .... As needed 10)  Spiriva Handihaler 18 Mcg Caps (Tiotropium bromide monohydrate) .... Every other day 11)  Vitamin D 1000 Unit Tabs (Cholecalciferol) .Marland Kitchen.. 1 by mouth once daily 12)  Aspirin 325 Mg Tabs (Aspirin) .Marland Kitchen.. 1 by mouth once daily 13)  Plavix 75 Mg Tabs (Clopidogrel bisulfate) .Marland Kitchen.. 1 by mouth once daily 14)  Metoprolol Tartrate 25 Mg Tabs (Metoprolol tartrate) .... 1/2 by mouth two times a day  Other Orders: Flu Vaccine 4yrs + MEDICARE PATIENTS (U9811) Administration Flu vaccine - MCR (B1478)  Patient Instructions: 1)  you had the flu shot today 2)  you should receive a call soon about the prolia copay 3)  Continue all previous medications as before this visit 4)  Please schedule a follow-up appointment in 4 months with CPX labs  Flu Vaccine Consent Questions     Do you have a history of severe allergic reactions to this vaccine? no    Any prior history  of allergic reactions to egg and/or gelatin? no    Do you have a sensitivity to the preservative  Thimersol? no    Do you have a past history of Guillan-Barre Syndrome? no    Do you currently have an acute febrile illness? no    Have you ever had a severe reaction to latex? no    Vaccine information given and explained to patient? yes    Are you currently pregnant? no    Lot Number:[id number]   Exp Date:11/21/2010   Site Given  Left Deltoid IMdflu

## 2010-06-23 NOTE — Medication Information (Signed)
Summary: Benefits/Prolia  Benefits/Prolia   Imported By: Lester [place] 02/27/2010 09:19:59  _____________________________________________________________________  External Attachment:    Type:   Image     Comment:   External Document

## 2010-06-23 NOTE — Assessment & Plan Note (Signed)
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 S/p PCI LAD in 8/10 stented with BMS.  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   Admitted september 2011 with CP and found to he 70-80% ISR of bare metal stent in LAD. Under went placement of DES within her previous stent.   Here for f/u.  No CP. Continues with mild dyspnea. Improved with Spiriva and albuterol as needed. Feels tired at times. Compliant with Plavix.  Last week had 2 episodes of mild vaginal bleeding. (Had hysterectomy in 1973). No CP or SOB. Frequent bleeding with any nicks.     Current Medications (verified): 1)  Lisinopril 10 Mg  Tabs (Lisinopril) .Marland Kitchen.. 1 By Mouth Qd 2)  Alendronate Sodium 70 Mg  Tabs (Alendronate Sodium) .Marland Kitchen.. 1 By Mouth Q Week 3)  Amitriptyline Hcl 10 Mg  Tabs (Amitriptyline Hcl) .... 2 By Mouth Q Hs 4)  Tramadol Hcl 50 Mg  Tabs (Tramadol Hcl) .... 2 By Mouth Q 6 Hrs. 5)  Promethazine Hcl 25 Mg  Supp (Promethazine Hcl) .Marland Kitchen.. 1 By Mouth W/ Meals 6)  Proventil Hfa 108 (90 Base) Mcg/act  Aers (Albuterol Sulfate) .... Use As Directed 7)  Simvastatin 40 Mg Tabs (Simvastatin) .Marland Kitchen.. 1 By Mouth Once Daily 8)  Aspirin 81 Mg Tabs (Aspirin) .... Once Daily 9)  Proventil Hfa 108 (90 Base) Mcg/act Aers (Albuterol Sulfate) .... As Needed 10)  Spiriva Handihaler 18 Mcg Caps (Tiotropium Bromide Monohydrate) .... Every Other Day 11)  Vitamin D 1000 Unit Tabs (Cholecalciferol) .Marland Kitchen.. 1 By Mouth Once Daily 12)  Aspirin 325 Mg Tabs (Aspirin) .Marland Kitchen.. 1 By Mouth Once Daily 13)  Plavix 75 Mg Tabs (Clopidogrel Bisulfate) .Marland Kitchen.. 1 By Mouth Once Daily 14)  Metoprolol Tartrate 25 Mg Tabs (Metoprolol Tartrate) .... 1/2 By Mouth Two Times A Day  Allergies (verified): 1)  ! Codeine 2)  Asa 3)  * Anticoagulants  Past History:  Past Medical History: CAD   --s/p BMS stent to LAD 8/10    --ISR of LAD  9/11 treated with DES Hypertension Osteoporosis Low back pain, recurrent LS Spine DDD/chronic right foot drop Depression Hyperlipidemia Headache Syncope Anxiety COPD Transient ischemic attack, hx of hx of small cerebral hemorrhage from 2001 glucose intolerance hx of neurocardiogenic syncope - dr Graciela Husbands right sciatica/recurrent   Review of Systems       As per HPI and past medical history; otherwise all systems negative.   Vital Signs:  Patient profile:   70 year old female Height:      66 inches Weight:      137 pounds BMI:     22.19 Pulse rate:   79 / minute Resp:     16 per minute BP sitting:   138 / 72  (left arm)  Vitals Entered By: Marrion Coy, CNA (February 23, 2010 3:32 PM)  Physical Exam  General:  Well appearing. no resp difficulty HEENT: normal Neck: supple. no JVD. Carotids 2+ bilat; no bruits. No lymphadenopathy or thryomegaly appreciated. Cor: PMI nondisplaced. Regular rate & rhythm. No rubs, gallops, murmur. Lungs: clear Abdomen: soft, nontender, nondistended.  Extremities: no cyanosis, clubbing, rash, edema. going ok. no bruit Neuro: alert & orientedx3, cranial nerves grossly intact. moves all 4 extremities w/o difficulty. affect pleasant     Impression & Recommendations:  Problem # 1:  CAD, NATIVE VESSEL (ICD-414.01) Stable  s/p recent stent. Continue Plavix x 1 year at least. Can decrease ASA to 81 once daily. Continue statin. Refer to cardiac rehab.  Orders: EKG w/ Interpretation (93000)  Problem # 2:   VAGINAL BLEEDING Needs to see gynecology.   Patient Instructions: 1)  Your physician recommends that you schedule a follow-up appointment in: 6 months 2)  Your physician has recommended you make the following change in your medication: Decrease asprin 81mg  1 by mouth daily 3)  We recommend you follow up with your Gynocologist 4)  We have referred you to cardaic rehab, they will contact you to schedule a class

## 2010-06-23 NOTE — Letter (Signed)
Summary: Cardiac Rehab Program  Cardiac Rehab Program   Imported By: Marylou Mccoy 02/18/2010 11:00:21  _____________________________________________________________________  External Attachment:    Type:   Image     Comment:   External Document

## 2010-06-23 NOTE — Assessment & Plan Note (Signed)
Summary: 4 mos f/u $50 / cd   Vital Signs:  Patient profile:   70 year old female Height:      65 inches Weight:      138 pounds BMI:     23.05 O2 Sat:      97 % on Room air Temp:     96 degrees F oral Pulse rate:   100 / minute BP sitting:   120 / 78  (left arm) Cuff size:   regular  Vitals Entered ByZella Ball Ewing (June 04, 2009 3:01 PM)  O2 Flow:  Room air CC: 4 MO ROV/RE   CC:  4 MO ROV/RE.  History of Present Illness: no  longer able to squat down without help getting back up;  pain overall controlled but still weak in the prox legs , worse in the psat 6 months;  also mentions right hip pain - lateral type which is worse to turn over in bed, as well occasinal right groin pain with standing up;  no falls or other injury; Pt denies CP, worsening  sob or doe, wheezing, orthopnea, pnd, worsening LE edema, palps, dizziness or syncope  .  Pt denies new neuro symptoms such as headache, facial or extremity weakness     Problems Prior to Update: 1)  COPD  (ICD-496) 2)  Bruit  (ICD-785.9) 3)  Cad, Native Vessel  (ICD-414.01) 4)  Other Symptoms Involving Cardiovascular System  (ICD-785.9) 5)  Bruit  (ICD-785.9) 6)  Hypertension, Benign  (ICD-401.1) 7)  Chest Pain-precordial  (ICD-786.51) 8)  Osteoarthritis, Hip  (ICD-715.95) 9)  Groin Pain  (ICD-789.09) 10)  Preventive Health Care  (ICD-V70.0) 11)  Preventive Health Care  (ICD-V70.0) 12)  Glucose Intolerance  (ICD-271.3) 13)  Transient Ischemic Attack, Hx of  (ICD-V12.50) 14)  COPD  (ICD-496) 15)  Anxiety  (ICD-300.00) 16)  Syncope  (ICD-780.2) 17)  Headache  (ICD-784.0) 18)  Hyperlipidemia  (ICD-272.4) 19)  Depression  (ICD-311) 20)  Low Back Pain  (ICD-724.2) 21)  Osteoporosis  (ICD-733.00) 22)  Hypertension  (ICD-401.9)  Medications Prior to Update: 1)  Lisinopril 10 Mg  Tabs (Lisinopril) .Marland Kitchen.. 1 By Mouth Qd 2)  Alendronate Sodium 70 Mg  Tabs (Alendronate Sodium) .Marland Kitchen.. 1 By Mouth Q Week 3)  Amitriptyline Hcl 10 Mg   Tabs (Amitriptyline Hcl) .... 2 By Mouth Q Hs 4)  Tramadol Hcl 50 Mg  Tabs (Tramadol Hcl) .... 2 By Mouth Q 6 Hrs. 5)  Promethazine Hcl 25 Mg  Supp (Promethazine Hcl) .Marland Kitchen.. 1 By Mouth W/ Meals 6)  Proventil Hfa 108 (90 Base) Mcg/act  Aers (Albuterol Sulfate) .... Use As Directed 7)  Simvastatin 40 Mg Tabs (Simvastatin) .... 1/2 By Mouth Once Daily 8)  Plavix 75 Mg Tabs (Clopidogrel Bisulfate) .... Once Daily For 30 Days 9)  Aspirin 81 Mg Tabs (Aspirin) .... Once Daily 10)  Proventil Hfa 108 (90 Base) Mcg/act Aers (Albuterol Sulfate) .... As Needed 11)  Spiriva Handihaler 18 Mcg Caps (Tiotropium Bromide Monohydrate) .... Every Other Day 12)  Vitamin D 1000 Unit Tabs (Cholecalciferol) .Marland Kitchen.. 1 By Mouth Once Daily  Current Medications (verified): 1)  Lisinopril 10 Mg  Tabs (Lisinopril) .Marland Kitchen.. 1 By Mouth Qd 2)  Alendronate Sodium 70 Mg  Tabs (Alendronate Sodium) .Marland Kitchen.. 1 By Mouth Q Week 3)  Amitriptyline Hcl 10 Mg  Tabs (Amitriptyline Hcl) .... 2 By Mouth Q Hs 4)  Tramadol Hcl 50 Mg  Tabs (Tramadol Hcl) .... 2 By Mouth Q 6 Hrs. 5)  Promethazine  Hcl 25 Mg  Supp (Promethazine Hcl) .Marland Kitchen.. 1 By Mouth W/ Meals 6)  Proventil Hfa 108 (90 Base) Mcg/act  Aers (Albuterol Sulfate) .... Use As Directed 7)  Simvastatin 40 Mg Tabs (Simvastatin) .... 1/2 By Mouth Once Daily 8)  Plavix 75 Mg Tabs (Clopidogrel Bisulfate) .... Once Daily For 30 Days 9)  Aspirin 81 Mg Tabs (Aspirin) .... Once Daily 10)  Proventil Hfa 108 (90 Base) Mcg/act Aers (Albuterol Sulfate) .... As Needed 11)  Spiriva Handihaler 18 Mcg Caps (Tiotropium Bromide Monohydrate) .... Every Other Day 12)  Vitamin D 1000 Unit Tabs (Cholecalciferol) .Marland Kitchen.. 1 By Mouth Once Daily  Allergies (verified): 1)  ! Codeine 2)  Asa 3)  * Anticoagulants  Past History:  Past Medical History: Last updated: 01/15/2009 CAD   --s/p BMS stent to LAD 8/10  Hypertension Osteoporosis Low back pain, recurrent LS Spine DDD/chronic right foot  drop Depression Hyperlipidemia Headache Syncope Anxiety COPD Transient ischemic attack, hx of hx of small cerebral hemorrhage from 2001 glucose intolerance hx of neurocardiogenic syncope - dr Graciela Husbands right sciatica/recurrent   Past Surgical History: Last updated: 01/23/2009 Appendectomy Hysterectomy Oophorectomy- one ovary s/p lumbar disc surgury - dr Leslee Home s/p bilat cataract 2010  Family History: Last updated: 08/17/2007 2 sisters with osteoporosis brother with heart disease sister with epilepsy  Social History: Last updated: 08/17/2007 Alcohol use-no Married 4 children Former Smoker - quit march 2007 disalbed iwth back since 2003 - prior admin assist  Risk Factors: Smoking Status: quit (08/17/2007)  Review of Systems       The patient complains of muscle weakness and difficulty walking.  The patient denies anorexia, fever, weight gain, vision loss, decreased hearing, hoarseness, chest pain, syncope, peripheral edema, prolonged cough, headaches, hemoptysis, abdominal pain, melena, hematochezia, severe indigestion/heartburn, hematuria, incontinence, suspicious skin lesions, transient blindness, depression, unusual weight change, abnormal bleeding, enlarged lymph nodes, and angioedema.         all otherwise negative per pt - hard to stand up from sitting with bilat thigh weakness  Physical Exam  General:  alert and overweight-appearing.   Head:  normocephalic and atraumatic.   Eyes:  vision grossly intact, pupils equal, and pupils round.   Ears:  R ear normal and L ear normal.   Nose:  no external deformity and no nasal discharge.   Mouth:  no gingival abnormalities and pharynx pink and moist.   Neck:  supple and no masses.   Lungs:  normal respiratory effort, R decreased breath sounds, and L decreased breath sounds.   Heart:  normal rate and regular rhythm.   Abdomen:  soft, non-tender, and normal bowel sounds.   Msk:  no joint tenderness and no joint  swelling.  except for right lateral tender over greater trochanter Extremities:  no edema, no erythema  Neurologic:  cranial nerves II-XII intact and strength normal in all extremities.  except for chroinc right foot drop   Impression & Recommendations:  Problem # 1:  Preventive Health Care (ICD-V70.0) Overall doing well, updated the age appropriate counseling and education;  routine health screening/prevention reviewed and done as appropriate unless declined, immunizations up to date or declined, diet counseling done if overweight, urged to quit smoking if smokes , labs reviewed  Problem # 2:  MUSCLE WEAKNESS (GENERALIZED) (ICD-728.87)  quads bilat - for PT  Orders: Physical Therapy Referral (PT)  Problem # 3:  BURSITIS, RIGHT HIP (ICD-726.5) mild, declines repeat ortho eval and cortisone today  Complete Medication List: 1)  Lisinopril 10  Mg Tabs (Lisinopril) .Marland Kitchen.. 1 by mouth qd 2)  Alendronate Sodium 70 Mg Tabs (Alendronate sodium) .Marland Kitchen.. 1 by mouth q week 3)  Amitriptyline Hcl 10 Mg Tabs (Amitriptyline hcl) .... 2 by mouth q hs 4)  Tramadol Hcl 50 Mg Tabs (Tramadol hcl) .... 2 by mouth q 6 hrs. 5)  Promethazine Hcl 25 Mg Supp (Promethazine hcl) .Marland Kitchen.. 1 by mouth w/ meals 6)  Proventil Hfa 108 (90 Base) Mcg/act Aers (Albuterol sulfate) .... Use as directed 7)  Simvastatin 40 Mg Tabs (Simvastatin) .... 1/2 by mouth once daily 8)  Plavix 75 Mg Tabs (Clopidogrel bisulfate) .... Once daily for 30 days 9)  Aspirin 81 Mg Tabs (Aspirin) .... Once daily 10)  Proventil Hfa 108 (90 Base) Mcg/act Aers (Albuterol sulfate) .... As needed 11)  Spiriva Handihaler 18 Mcg Caps (Tiotropium bromide monohydrate) .... Every other day 12)  Vitamin D 1000 Unit Tabs (Cholecalciferol) .Marland Kitchen.. 1 by mouth once daily  Patient Instructions: 1)  You will be contacted about the referral(s) to: Physical Therapy 2)  Continue all previous medications as before this visit  3)  Please schedule a follow-up appointment in  6 months.

## 2010-06-23 NOTE — Miscellaneous (Signed)
Summary: [facility]   [facility]   Imported By: Roderic Ovens 04/13/2010 15:04:07  _____________________________________________________________________  External Attachment:    Type:   Image     Comment:   External Document

## 2010-06-23 NOTE — Letter (Signed)
Summary: Colonoscopy Letter  [facility]  [address] [place], [place] [address]   Phone: [phone]  Fax: [phone]      January 05, 2010 MRN: [id number]   [facility] [address] [place], [place]  [address]   Dear Ms. Sturges,   According to your medical record, it is time for you to schedule a Colonoscopy. The American Cancer Society recommends this procedure as a method to detect early colon cancer. Patients with a family history of colon cancer, or a personal history of colon polyps or inflammatory bowel disease are at increased risk.  This letter has beeen generated based on the recommendations made at the time of your procedure. If you feel that in your particular situation this may no longer apply, please contact our office.  Please call our office at [phone] to schedule this appointment or to update your records at your earliest convenience.  Thank you for cooperating with [place] to provide you with the very best care possible.   Sincerely,   Iva Boop, M.D.  [facility] [phone]

## 2010-06-23 NOTE — Letter (Signed)
Summary: [facility] Progress Note   [facility] Progress Note   Imported By: Roderic Ovens 03/30/2010 15:11:45  _____________________________________________________________________  External Attachment:    Type:   Image     Comment:   External Document

## 2010-08-06 LAB — POCT I-STAT, CHEM 8
Calcium, Ion: 1.07 mmol/L — ABNORMAL LOW (ref 1.12–1.32)
Chloride: 105 mEq/L (ref 96–112)
Glucose, Bld: 99 mg/dL (ref 70–99)
Hemoglobin: 13.6 g/dL (ref 12.0–15.0)
Sodium: 141 mEq/L (ref 135–145)

## 2010-08-06 LAB — CBC
HCT: 38.2 % (ref 36.0–46.0)
Hemoglobin: 11.8 g/dL — ABNORMAL LOW (ref 12.0–15.0)
Hemoglobin: 12.7 g/dL (ref 12.0–15.0)
MCH: 27.4 pg (ref 26.0–34.0)
MCH: 27.6 pg (ref 26.0–34.0)
MCH: 27.9 pg (ref 26.0–34.0)
MCHC: 33.2 g/dL (ref 30.0–36.0)
MCV: 82.4 fL (ref 78.0–100.0)
MCV: 84 fL (ref 78.0–100.0)
Platelets: 212 10*3/uL (ref 150–400)
Platelets: 225 10*3/uL (ref 150–400)
RBC: 4.31 MIL/uL (ref 3.87–5.11)
RBC: 4.45 MIL/uL (ref 3.87–5.11)
RBC: 4.55 MIL/uL (ref 3.87–5.11)
RDW: 13.1 % (ref 11.5–15.5)
RDW: 13.3 % (ref 11.5–15.5)
WBC: 8.2 10*3/uL (ref 4.0–10.5)

## 2010-08-06 LAB — BASIC METABOLIC PANEL
CO2: 29 mEq/L (ref 19–32)
Calcium: 8.9 mg/dL (ref 8.4–10.5)
Creatinine, Ser: 0.98 mg/dL (ref 0.4–1.2)
GFR calc Af Amer: >60 mL/min (ref >60)

## 2010-08-06 LAB — CARDIAC PANEL(CRET KIN+CKTOT+MB+TROPI)
CK, MB: 4.5 ng/mL — ABNORMAL HIGH (ref 0.3–4.0)
Total CK: 59 U/L (ref 7–177)
Troponin I: 0.42 ng/mL — ABNORMAL HIGH (ref 0.00–0.06)

## 2010-08-06 LAB — POCT CARDIAC MARKERS
CKMB, poc: <1 ng/mL — ABNORMAL LOW (ref 1.0–8.0)
Myoglobin, poc: 47.9 ng/mL (ref 12–200)
Myoglobin, poc: 55 ng/mL (ref 12–200)
Troponin i, poc: <0.05 ng/mL (ref 0.00–0.09)

## 2010-08-06 LAB — PROTIME-INR
INR: 0.91 (ref 0.00–1.49)
Prothrombin Time: 12.5 seconds (ref 11.6–15.2)

## 2010-08-06 LAB — MRSA PCR SCREENING: MRSA by PCR: NEGATIVE

## 2010-08-06 LAB — LIPID PANEL
HDL: 64 mg/dL (ref >39)
Triglycerides: 111 mg/dL (ref <150)
VLDL: 22 mg/dL (ref 0–40)

## 2010-08-06 LAB — DIFFERENTIAL
Basophils Absolute: 0.1 10*3/uL (ref 0.0–0.1)
Basophils Relative: 1 % (ref 0–1)
Eosinophils Absolute: 0.5 10*3/uL (ref 0.0–0.7)
Eosinophils Relative: 7 % — ABNORMAL HIGH (ref 0–5)
Lymphocytes Relative: 41 % (ref 12–46)
Lymphs Abs: 2.9 10*3/uL (ref 0.7–4.0)
Monocytes Relative: 7 % (ref 3–12)
Neutrophils Relative %: 44 % (ref 43–77)

## 2010-08-06 LAB — CK TOTAL AND CKMB (NOT AT ARMC): Total CK: 40 U/L (ref 7–177)

## 2010-08-06 LAB — TROPONIN I: Troponin I: 0.06 ng/mL (ref 0.00–0.06)

## 2010-08-24 ENCOUNTER — Other Ambulatory Visit: Payer: Self-pay | Admitting: Cardiovascular Disease

## 2010-08-29 LAB — CK TOTAL AND CKMB (NOT AT ARMC): Relative Index: INVALID (ref 0.0–2.5)

## 2010-08-29 LAB — CARDIAC PANEL(CRET KIN+CKTOT+MB+TROPI)
Relative Index: INVALID (ref 0.0–2.5)
Total CK: 43 U/L (ref 7–177)
Troponin I: 0.01 ng/mL (ref 0.00–0.06)

## 2010-08-29 LAB — CBC
HCT: 35.9 % — ABNORMAL LOW (ref 36.0–46.0)
Hemoglobin: 12.1 g/dL (ref 12.0–15.0)
MCHC: 33.6 g/dL (ref 30.0–36.0)
MCV: 83.9 fL (ref 78.0–100.0)
MCV: 84.2 fL (ref 78.0–100.0)
MCV: 85.1 fL (ref 78.0–100.0)
Platelets: 205 10*3/uL (ref 150–400)
Platelets: 209 10*3/uL (ref 150–400)
RBC: 4.28 MIL/uL (ref 3.87–5.11)
RBC: 4.31 MIL/uL (ref 3.87–5.11)
RDW: 13.6 % (ref 11.5–15.5)
WBC: 6.6 10*3/uL (ref 4.0–10.5)
WBC: 6.7 10*3/uL (ref 4.0–10.5)
WBC: 7.3 10*3/uL (ref 4.0–10.5)

## 2010-08-29 LAB — BASIC METABOLIC PANEL
BUN: 4 mg/dL — ABNORMAL LOW (ref 6–23)
Chloride: 106 mEq/L (ref 96–112)
Chloride: 107 mEq/L (ref 96–112)
Chloride: 109 mEq/L (ref 96–112)
Creatinine, Ser: 0.81 mg/dL (ref 0.4–1.2)
GFR calc Af Amer: >60 mL/min (ref >60)
GFR calc Af Amer: >60 mL/min (ref >60)
GFR calc non Af Amer: >60 mL/min (ref >60)
GFR calc non Af Amer: >60 mL/min (ref >60)
Potassium: 4.4 mEq/L (ref 3.5–5.1)
Potassium: 4.6 mEq/L (ref 3.5–5.1)
Sodium: 141 mEq/L (ref 135–145)

## 2010-08-29 LAB — COMPREHENSIVE METABOLIC PANEL
ALT: 15 U/L (ref 0–35)
AST: 24 U/L (ref 0–37)
Calcium: 8.5 mg/dL (ref 8.4–10.5)
Creatinine, Ser: 0.89 mg/dL (ref 0.4–1.2)
GFR calc Af Amer: >60 mL/min (ref >60)
Sodium: 143 mEq/L (ref 135–145)
Total Protein: 5.7 g/dL — ABNORMAL LOW (ref 6.0–8.3)

## 2010-08-29 LAB — LIPID PANEL
Cholesterol: 129 mg/dL (ref 0–200)
LDL Cholesterol: 43 mg/dL (ref 0–99)
Total CHOL/HDL Ratio: 2.5 RATIO
Triglycerides: 175 mg/dL — ABNORMAL HIGH (ref <150)
VLDL: 35 mg/dL (ref 0–40)

## 2010-08-29 LAB — APTT: aPTT: 26 seconds (ref 24–37)

## 2010-08-29 LAB — POCT CARDIAC MARKERS
CKMB, poc: <1 ng/mL — ABNORMAL LOW (ref 1.0–8.0)
CKMB, poc: <1 ng/mL — ABNORMAL LOW (ref 1.0–8.0)
Myoglobin, poc: 52.3 ng/mL (ref 12–200)
Troponin i, poc: <0.05 ng/mL (ref 0.00–0.09)
Troponin i, poc: <0.05 ng/mL (ref 0.00–0.09)

## 2010-08-29 LAB — TSH: TSH: 3.104 u[IU]/mL (ref 0.350–4.500)

## 2010-08-29 LAB — HEMOGLOBIN A1C: Hgb A1c MFr Bld: 5.4 % (ref 4.6–6.1)

## 2010-08-29 LAB — D-DIMER, QUANTITATIVE: D-Dimer, Quant: 0.77 ug/mL-FEU — ABNORMAL HIGH (ref 0.00–0.48)

## 2010-09-03 ENCOUNTER — Encounter: Payer: Self-pay | Admitting: Internal Medicine

## 2010-09-03 ENCOUNTER — Ambulatory Visit (INDEPENDENT_AMBULATORY_CARE_PROVIDER_SITE_OTHER): Payer: Medicare Other | Admitting: Internal Medicine

## 2010-09-03 VITALS — BP 115/70 | HR 73 | Resp 14 | Ht 67.0 in | Wt 127.0 lb

## 2010-09-03 DIAGNOSIS — I2510 Atherosclerotic heart disease of native coronary artery without angina pectoris: Secondary | ICD-10-CM

## 2010-09-03 DIAGNOSIS — E785 Hyperlipidemia, unspecified: Secondary | ICD-10-CM

## 2010-09-03 NOTE — Assessment & Plan Note (Signed)
Followed by Dr. Jonny Ruiz. Goal LDL < 70. Continue statin.

## 2010-09-03 NOTE — Patient Instructions (Signed)
Your physician wants you to follow-up in:  6 months. You will receive a reminder letter in the mail two months in advance. If you don't receive a letter, please call our office to schedule the follow-up appointment.   

## 2010-09-03 NOTE — Progress Notes (Signed)
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 S/p PCI LAD in 8/10 stented with BMS.  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   Admitted September 2011 with CP and found to he 70-80% ISR of bare metal stent in LAD. Under went placement of DES within her previous stent.   Here for f/u.  No CP. Continues with chronic dyspnea. Improved with Spiriva and albuterol as needed. Compliant with Plavix and simvastatin. Having trouble walking due to weakness in legs relates it to nerve damage from back surgery in 2003. Uses a TENS unit. No frank claudication. No change with leaning forward. Follows in pain clinic.    ROS: All systems negative except as listed in HPI, PMH and Problem List.  No past medical history on file.  Current Outpatient Prescriptions  Medication Sig Dispense Refill  . albuterol (PROVENTIL HFA) 108 (90 BASE) MCG/ACT inhaler Inhale 2 puffs into the lungs every 6 (six) hours as needed.        Marland Kitchen alendronate (FOSAMAX) 70 MG tablet Take 70 mg by mouth every 7 (seven) days. Take with a full glass of water on an empty stomach.       Marland Kitchen amitriptyline (ELAVIL) 10 MG tablet 2 tabs po qhs      . aspirin 81 MG tablet Take 81 mg by mouth daily.        . Cholecalciferol (VITAMIN D) 1000 UNITS capsule Take 1,000 Units by mouth daily.        Marland Kitchen lisinopril (PRINIVIL,ZESTRIL) 10 MG tablet Take 10 mg by mouth daily.        . metoprolol tartrate (LOPRESSOR) 25 MG tablet TAKE 1/ 2 A TABLET BY MOUTH TWICE A DAY  30 tablet  6  . PLAVIX 75 MG tablet TAKE 1 TABLET BY MOUTH EVERY DAY WITH MEAL  30 tablet  6  . promethazine (PHENERGAN) 25 MG tablet Take 25 mg by mouth every 6 (six) hours as needed.        . simvastatin (ZOCOR) 40 MG tablet Take 40 mg by mouth at bedtime.        Marland Kitchen tiotropium (SPIRIVA) 18 MCG inhalation capsule Place 18 mcg into inhaler and inhale every other day.        . traMADol (ULTRAM) 50 MG tablet Take 50 mg by  mouth every 6 (six) hours as needed.        Marland Kitchen DISCONTD: promethazine (PHENERGAN) 25 MG suppository Place 25 mg rectally every 6 (six) hours as needed.           PHYSICAL EXAM: Filed Vitals:   09/03/10 1451  BP: 115/70  Pulse: 73  Resp: 14   General:  Elderly.  no resp difficulty HEENT: normal Neck: supple. no JVD. Carotids 2+ bilat; no bruits. No lymphadenopathy or thryomegaly appreciated. Cor: Distant HS. PMI nonpalpable. Regular rate & rhythm. No rubs, gallops, murmur. Lungs: clear Abdomen: soft, nontender, nondistended.  Extremities: no cyanosis, clubbing, rash, edema. going ok. no bruit Neuro: alert & orientedx3, cranial nerves grossly intact. moves all 4 extremities w/o difficulty. affect pleasant    ECG: NSR 73 No ST-T wave abnormalities.     ASSESSMENT & PLAN:

## 2010-09-03 NOTE — Assessment & Plan Note (Signed)
No evidence of ischemia. Continue current regimen. Can stop Plavix after 1 year.

## 2010-09-09 ENCOUNTER — Ambulatory Visit: Payer: Self-pay | Admitting: Internal Medicine

## 2010-10-06 NOTE — Cardiovascular Report (Signed)
NAMEMIRIYA, CLOER             ACCOUNT NO.:  [id number]   MEDICAL RECORD NO.:  [id number]          PATIENT TYPE:  INP   LOCATION:  2807                         FACILITY:  [facility]   PHYSICIAN:  Bevelyn Buckles. Bensimhon, MDDATE OF BIRTH:  1940/10/03   DATE OF PROCEDURE:  01/07/2009  DATE OF DISCHARGE:                            CARDIAC CATHETERIZATION   PATIENT IDENTIFICATION:  Alexis Higgins is a 70 year old woman with a history  of COPD, hypertension, hyperlipidemia and a previous cerebral hemorrhage  in 2001.  She was admitted with chest pain.  Cardiac enzymes and EKG  have been normal, however, she has had ongoing chest pain.  CT scan  showed no evidence of pulmonary embolus.  We are asked to see her in  consult.  I discussed the risk of cardiac catheterization versus stress  testing with her and her husband and we decided on catheterization.   PROCEDURES PERFORMED:  1. Selective coronary angiography.  2. Left heart cath.  3. Left ventriculogram.  4. StarClose femoral artery closure.   DESCRIPTION OF PROCEDURE:  The risks and indication were explained.  Consent was signed and placed on the chart.  A 5-French arterial sheath  was placed in the right femoral artery using a modified Seldinger  technique.  Standard catheters including a JL-4, JR-4 and angled pigtail  were used for the procedure.  All catheter exchanges were made over a  wire.  There were no apparent complications.  Central aortic pressure  164/79 with a mean of 118.  LV pressure 171/13 with an EDP of 21.  There  was no aortic stenosis.   Left main was long, angiographically normal.   LAD was a moderate-sized vessel coursing to the apex and gave off a  large septal branch and distal diagonal branch.  There was an 80-90%  lesion in the proximal LAD.  There was TIMI 3 flow.  Otherwise, the  vessel was angiographically normal.   Left circumflex gave off a moderate size ramus branch and a tiny OM  branch, it was  angiographically normal.   Right coronary artery was a dominant vessel and gave off a PDA and a  posterolateral.  There was a 30-40% lesion proximally and a 20% plaque  in the midsection.   Left ventriculogram done in the RAO position showed an EF of 55% with no  regional wall motion abnormalities.   ASSESSMENT:  1. Coronary artery disease with a high-grade proximal left anterior      descending lesion.  2. Normal left ventricular function with elevated left ventricular end-      diastolic pressure suggestive of diastolic dysfunction.  3. History of intracranial hemorrhage by report in 2001.   PLAN/DISCUSSION:  I think Alexis Higgins will be best served with a  percutaneous intervention of her proximal LAD.  However, that will  require at least 1 month of antiplatelet therapy with Plavix.  At this  point, we will consult  neurology to help assess the risk of antiplatelet therapy.  If she is  considered not to be a Plavix candidate we will then have to consider a  possible off-pump  one vessel CABG with a LIMA to the LAD.  Further  disposition pending neurology recommendations.      Bevelyn Buckles. Bensimhon, MD  Electronically Signed     DRB/MEDQ  D:  01/07/2009  T:  01/07/2009  Job:  [id number]

## 2010-10-06 NOTE — H&P (Signed)
NAMESHARIFAH, CHAMPINE             ACCOUNT NO.:  [id number]   MEDICAL RECORD NO.:  [id number]          PATIENT TYPE:  EMS   LOCATION:  MAJO                         FACILITY:  [facility]   PHYSICIAN:  Michiel Cowboy, MDDATE OF BIRTH:  Mar 11, 1941   DATE OF ADMISSION:  01/06/2009  DATE OF DISCHARGE:                              HISTORY & PHYSICAL   CHIEF COMPLAINT:  Chest pain.   The patient is a 70 year old female with past medical history  significant for COPD and TIA.  The patient was at her baseline of health  until yesterday when she started developing intermittent sensation of  pressure over her upper shoulders, going down in both of her arms.  No  sharp chest pain.  She had occasional chills and sweats.  Today, the  pain seemed to be more of a chest pressure or actually pressure over her  entire trunk, as she describes it.  It does not seem to be worsening  with deep breaths.  When she sits up, there is somewhat of a worsening  and a sensation of overall pushing down on her.  The sensation seems to  be relieved the most when she is laying down flat and relaxing.  Otherwise no nausea, no vomiting, no diaphoresis, no constipation, no  diarrhea.  Review of systems otherwise negative.  She has had a stress  test which was very remote but apparently unremarkable.   PAST MEDICAL HISTORY:  1. Significant for TIA.  2. Low back pain chronic with neuropathy.  3. COPD.  4. Hypertension.  5. Hyperlipidemia.  6. Osteoporosis.   SOCIAL HISTORY:  The patient is a former smoker, quit, 3-1/2 years ago.  Does not drink, does not abuse drugs.   FAMILY HISTORY:  Significant for a brother with coronary disease in his  76s.  Sister with coronary disease in her 38s.  Another sister with CHF  in her 60s.   ALLERGIES:  CODEINE.   PRESENT MEDICATIONS:  1. Alendronate 70 mg every week.  2. Simvastatin 40 mg day.  3. Multivitamin.  4. Amitriptyline 20 mg q.h.s.  5. Tramadol as needed for  pain.  6. Phenergan as needed for nausea.  She has to take it together with      tramadol.  7. Lisinopril 10 mg daily.  8. Proventil as needed.   PHYSICAL EXAMINATION:  VITALS:  Temperature 97.8, blood pressure 144/88,  pulse 92, respirations 20, satting 98% room air.  The patient appears to be in no acute distress.  HEAD:  Nontraumatic.  Moist mucous membranes.  LUNGS:  Distant breath sounds but no crackles or wheezes appreciated.  HEART: Regular rate and rhythm.  No murmurs appreciated.  ABDOMEN: Soft, nontender, nondistended.  LOWER EXTREMITIES:  Without clubbing, cyanosis or edema.  NEUROLOGIC: The patient grossly intact.  SKIN:  Clean, dry and intact.   LABS:  White blood cell count of 7.3, hemoglobin 12.4.  Sodium 141,  potassium 3.5, creatinine 0.81.  Cardiac enzymes are negative.   EKG showing no evidence of ischemia.  There are Q-waves in lead V1-V2.   Chest x-ray showing COPD but otherwise  no changes.   ASSESSMENT/PLAN:  The patient is a pleasant 70 year old female with  chest pain and risk factors.  Will admit for rule out.  1. Chest pain.  Will cycle cardiac markers, obtain serial EKGs.  Check      fasting lipid panel, hemoglobin A1c and TSH.  Given Q-waves, will      evaluate a 2-D echo for any possibility of wall motion abnormality.      The patient will likely need a stress test, either during this      admission for shortly thereafter as an outpatient, as she has      pretty significant risk factors.  2. Hypertension.  Continue lisinopril.  3. Chronic obstructive pulmonary disease:  Currently does not appear      to act like a COPD exacerbation.  Will give albuterol as needed and      scheduled Atrovent.  The patient does have history of shortness of      breath, which he says is slowly getting worse over the years.  4. Prophylaxis  Protonix plus Lovenox.      Michiel Cowboy, MD  Electronically Signed     AVD/MEDQ  D:  01/07/2009  T:  01/07/2009   Job:  [id number]

## 2010-10-06 NOTE — Discharge Summary (Signed)
Alexis Higgins, Alexis Higgins             ACCOUNT NO.:  [id number]   MEDICAL RECORD NO.:  [id number]          PATIENT TYPE:  INP   LOCATION:  2507                         FACILITY:  [facility]   PHYSICIAN:  Lonia Blood, M.D.      DATE OF BIRTH:  1941-01-28   DATE OF ADMISSION:  01/06/2009  DATE OF DISCHARGE:  01/09/2009                               DISCHARGE SUMMARY   PRIMARY CARE PHYSICIAN:  Corwin Levins, MD, [facility]   DISCHARGE DIAGNOSES:  1. Coronary artery disease status post left anterior descending stent.  2. Chronic obstructive pulmonary disease.  3. History of intracranial bleed, now stable.  4. Hypertension.  5. Osteoporosis.  6. Depression and anxiety.  7. Dyslipidemia.   DISCHARGE MEDICATIONS:  1. Alendronate 70 mg weekly.  2. Simvastatin 40 mg daily.  3. Multivitamin 1 tablet daily.  4. Elavil 20 mg at bedtime.  5. Tramadol 50 mg as needed for pain.  6. Phenergan for nausea taken with the tramadol.  7. Lisinopril 10 mg daily.  8. Proventil inhaler as needed.  9. Aspirin 81 mg daily.  10.Plavix 75 mg p.o. daily.  11.Spiriva 1 capsule daily.   DISPOSITION:  The patient will be discharged home and will have followup  with Dr. Jonny Ruiz as well as Dr. Gala Romney, [facility].   PROCEDURES PERFORMED:  1. Chest x-ray on January 06, 2009 that showed emphysema and bibasilar      atelectasis, no acute cardiopulmonary disease.  2. CT angiogram on January 07, 2009 that showed technically adequate      study without pulmonary embolism, centrilobular emphysema,      atherosclerosis, and coronary artery disease.  3. Cardiac catheterization performed on January 07, 2009 that showed      left anterior descending, severe midpoint occlusion with subsequent      percutaneous coronary intervention performed on January 08, 2009.  4. A 2-D echocardiogram on January 07, 2009 showed normal systolic      function with an ejection fraction of 60-65%.  There was mildly      calcified  mitral valve annulus.   CONSULTATIONS:  Bevelyn Buckles. Bensimhon, MD, [facility]   BRIEF HISTORY AND PHYSICAL:  Please refer to the dictated history and  physical on admission by Dr. Therisa Doyne.  In short, however,  this is a 70 year old female with a history of COPD and prior  intracranial bleed that presented with chest pain.  The patient was  evaluated in the ER and showed some T-waves, EKG with Q-waves in V1-V2,  otherwise enzymes were negative.  Due to her risk factors including  hypertension and dyslipidemia, the patient was subsequently admitted for  further management.   HOSPITAL COURSE:  1. Coronary artery disease.  The patient was evaluated and Cardiology      was consulted on the patient.  She has had a followup with Dr.      Graciela Husbands about 5 years earlier with her numerous history including      family history of coronary artery disease.  She was seem to be high      risk and  was taken for a cath.  The result of her initial cath      showed high-grade proximal left anterior descending lesion.  With      that in mind, the patient was planned for PCI.  Neurology was      consulted who cleared the patient for the use of Plavix for 6      months and subsequently she had her LAD stented.  She is now chest      pain free and ready for discharge.  2. Hypertension.  Blood pressure is fully controlled on her current      regimen.  3. Osteoporosis.  The patient will resume her weekly medication      including alendronate.  4. COPD.  The patient has been stable.  She has been on nebulizers in      the hospital as well as Spiriva.  I am discharging her home on the      Spiriva in addition to her Proventil.   DISCHARGE LABORATORY DATA:  White count 6.6, hemoglobin 12.1, and  platelets 210.  Sodium 139, potassium 4.6, chloride 106, CO2 29, glucose  91, BUN 3, creatinine 0.89, and calcium 8.6.      Lonia Blood, M.D.  Electronically Signed     LG/MEDQ  D:  01/09/2009   T:  01/09/2009  Job:  [id number]

## 2010-10-06 NOTE — Consult Note (Signed)
Alexis Higgins, Alexis Higgins             ACCOUNT NO.:  [id number]   MEDICAL RECORD NO.:  [id number]          PATIENT TYPE:  INP   LOCATION:  3712                         FACILITY:  [facility]   PHYSICIAN:  Bevelyn Buckles. Bensimhon, MDDATE OF BIRTH:  14-Jan-1941   DATE OF CONSULTATION:  01/07/2009  DATE OF DISCHARGE:                                 CONSULTATION   PRIMARY CARE PHYSICIAN:  Corwin Levins, MD   PRIMARY CARDIOLOGIST:  Greater than 5 years ago with Dr. Graciela Husbands.   CHIEF COMPLAINT:  Chest pain.   HISTORY OF PRESENT ILLNESS:  Alexis Higgins is a 70 year old female with no  previous history of coronary artery disease.  She had onset of heaviness  in both of her shoulders and arms on January 05, 2009.  It started at  rest.  She describes it as a pressure and heaviness.  The pressure moved  to her chest on January 06, 2009.  It was associated with shortness of  breath and diaphoresis, but no nausea or vomiting.  The symptoms  increased in severity to a 10/10.  She did not try any medications at  home.  She came to the [facility] Emergency Room where she received  morphine and nitroglycerin at close to the same time.  The patient  states her pain improved to 4-5/10 with the medication, but she cannot  tell which one helped the most.  She has ongoing pain at a 4/10.  She  has never had these symptoms before.  She states the pain is worse with  movement or deep inspiration.  Certain movements also help make the pain  a little bit better.  She has no history of exertional symptoms, but  notes increasing dyspnea on exertion and decreasing exercise tolerance  over the last several months to a year.  Currently, although she is  still experiencing some chest discomfort, she is otherwise resting  comfortably.   PAST MEDICAL HISTORY:  1. Status post echocardiogram in January 2007 with normal left      ventricular systolic function.  No regional wall motion      abnormalities and no significant valvular  abnormalities.  2. History of neurocardiogenic syncope, evaluated by Dr. Graciela Husbands in      2002.  3. Hyperlipidemia with hypertriglyceridemia.  4. Hypertension.  5. Family history of coronary artery disease.  6. Remote history of tobacco use.  7. History of depression/anxiety.  8. History of TIA and cerebral hemorrhage in 2001.  9. COPD.  10.History of neuropathy with right foot drop.  11.History of osteoporosis.   SURGICAL HISTORY:  She is status post back surgery as well as  appendectomy, hysterectomy, one ovary removed, and a loop recorder as  well as its removal.   ALLERGIES:  She is allergic or intolerant to CODEINE and has aspirin and  anticoagulants listed although that is secondary to the history of brain  hemorrhage and is able to tolerate aspirin.   CURRENT MEDICATIONS:  1. Xanax p.r.n.  2. Spiriva daily.  3. Senokot nightly.  4. Protonix 40 mg a day.  5. Aspirin 325 mg daily.  6. Simvastatin 40 mg a day.  7. Elavil 20 mg nightly.  8. Lisinopril 10 mg a day.  9. Albuterol MDI.   SOCIAL HISTORY:  She lives in [place] with her husband.  She is a  disabled Environmental health practitioner.  She has approximately 45-pack-a-year  history of tobacco use and quit in March 2007.  She denies any history  of alcohol or drug abuse.   FAMILY HISTORY:  Mother died in her 67s with no history of heart disease  and her father died in his mid 28s of an accident.  She does, however,  have two sisters and a brother with coronary artery disease and her  brother died of a heart attack in his early 55s.   REVIEW OF SYSTEMS:  Other than described in the HPI include chronic  orthopnea as well as dyspnea on exertion, which is worse recently.  She  coughs occasionally and wheezes daily.  She has chronic arthralgias and  back pain.  She occasionally feels anxious.  She has rare reflux  symptoms.  Full 14-point review of systems is otherwise negative.   PHYSICAL EXAMINATION:  VITAL SIGNS:   Temperature is 97.7, blood pressure  116/68, heart rate 71, respiratory rate 18, O2 saturation 95% on room  air.  GENERAL:  She is a well-developed elderly white female in no acute  distress.  HEENT:  Normal.  NECK:  There is no lymphadenopathy, thyromegaly, bruit, or JVD noted.  CV:  Heart is regular in rate and rhythm with an S1 and S2 and no  significant murmur, rub, or gallop is noted.  Distal pulses are intact  in all four extremities.  LUNGS:  She has decreased breath sounds at the bases, but no crackles or  wheezing are noted.  SKIN:  No rashes or lesions are noted.  ABDOMEN:  Soft and nontender with active bowel sounds.  EXTREMITIES:  There is no cyanosis, clubbing, or edema noted.  MUSCULOSKELETAL:  There is no joint deformity or effusions and no spine  or CVA tenderness.  NEUROLOGIC:  She is alert and oriented.  Cranial nerves II and XII  grossly intact.   Chest x-ray shows emphysema, but no acute disease.   CT angiogram of the chest was negative for PE.  She has coronary  atherosclerosis present.  Mild aortic atherosclerosis is present.  No  lymphadenopathy is noted.   EKG:  Sinus rhythm, rate 74, with no acute ischemic changes.   LABORATORY VALUES:  Hemoglobin 12.6, hematocrit 37.7, WBCs 8.4,  platelets 209.  Sodium 143, potassium 4.5, chloride 112, CO2 of 26, BUN  3, creatinine 0.89, glucose 122.  INR 1.0.  Total cholesterol 129,  triglycerides 175, HDL 51, LDL 43.  CK-MB and troponin I negative x2.   IMPRESSION:  Alexis Higgins was seen today by Dr. Gala Romney.  She is a 24-  year-old female with multiple cardiac risk factors who is evaluated for  chest pain.  The chest pain has typical and atypical features.  There is  no objective evidence of ischemia.  However, she does have multiple  cardiac risk factors including a strong family history of  premature coronary artery disease as well as atherosclerosis seen on CT  angiogram.  The risks and benefits of cardiac  catheterization were  discussed with the patient and her husband.  They prefer cardiac  catheterization and we will proceed today.  Further evaluation and  treatment will depend on the results of the above testing.  Theodore Demark, PA-C      Bevelyn Buckles. Bensimhon, MD  Electronically Signed    RB/MEDQ  D:  01/07/2009  T:  01/08/2009  Job:  [id number]

## 2010-10-06 NOTE — Cardiovascular Report (Signed)
[name], Alexis Higgins             ACCOUNT NO.:  [id number]   MEDICAL RECORD NO.:  [id number]          PATIENT TYPE:  INP   LOCATION:  2507                         FACILITY:  [facility]   PHYSICIAN:  Everardo Beals. Juanda Chance, MD, FACCDATE OF BIRTH:  [birth year]   DATE OF PROCEDURE:  01/08/2009  DATE OF DISCHARGE:                            CARDIAC CATHETERIZATION   CLINICAL HISTORY:  Ms.  [name] was admitted to the hospital with unstable  angina and was studied yesterday by Dr. Arvilla Meres and was found  to have a high-grade 90% stenosis in the proximal LAD.  She has a  hemangioma of the brain and we obtain neurological consultation to see  if anticoagulation was safe.  The neurologist felt that we could proceed  with the procedure with her usual anticoagulation, but we decided to try  to minimize the duration of Plavix dosage and so, we have recommended a  bare-metal stent.   PROCEDURE:  The procedure was performed by the right femoral artery and  arterial sheath and 6-French XB LAD 3.5 guiding catheter with side  holes.  The patient was given Angiomax bolus and infusion.  She had been  given four chewable aspirin today and had been loaded with Plavix  yesterday.  We navigated a Prowater wire down to the LAD without  difficulty.  We predilated the lesion with a 2.5 x 12-mm apex balloon  performing one inflation up to 12 atmospheres for 30 seconds.  We then  deployed a 3.0 x 12-mm Vision stent deploying this with one inflation of  14 atmospheres for 30 seconds.  The distal edge of the stent was located  just before the bifurcation of the LAD and then diagonal branch.  We  then postdilated the stent with a 3.5 x 8-mm Dubois apex performing two  inflation up to 16 atmospheres for 30 seconds.  Final diagnostic studies  were then performed through the guiding catheter.  Right femoral was  closed with Angio-Seal at the end of the procedure.  The patient  tolerated the procedure well and left the  laboratory in satisfactory  condition.   RESULTS:  Initially, the stenosis in the proximal LAD was estimated at  90%.  Following stenting, this improved to 0%.   CONCLUSION:  Successful PCI of the lesion in the proximal LAD using a  Vision bare-metal stent with improvement in center narrowing from 90% to  0%.   DISPOSITION:  The patient returned to [facility] room for further  observation.  We will plan 1 month of Plavix and aspirin and then long-  term aspirin after that.      Bruce Elvera Lennox Juanda Chance, MD, [facility]  Electronically Signed     BRB/MEDQ  D:  01/08/2009  T:  01/08/2009  Job:  [id number]   cc:   Corwin Levins, MD  Bevelyn Buckles. Bensimhon, MD

## 2010-10-09 NOTE — Op Note (Signed)
[facility]  Patient:    LAIAH, POUNCEY Visit Number: [id number] MRN: [id number]          Service Type: SUR Location: 4W 0456 02 Attending Physician:  Marlowe Kays Page Dictated by:   Illene Labrador. Aplington, M.D. Proc. 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  POSTOPERATIVE DIAGNOSES:  Spondylosis at 4-5 right with compression of the L5 nerve root.  OPERATION PERFORMED:  Microdissection L4-5 right with decompression of the L5 nerve root and inspection of the L4-5 disk.  SURGEON:  Illene Labrador. Aplington, M.D.  ASSISTANT:  Georges Lynch. Darrelyn Hillock, M.D.  ANESTHESIA:  General.  PATHOLOGY AND JUSTIFICATION FOR PROCEDURE:  She developed severe back and right leg pain around the early part of January. Plain x-rays of her lumbar spine showed slight lumbar scoliosis with fairly pronounced narrowing of the L4-5 disk. Because of this, I sent here for a myelogram and CT scan thinking that the settling of the disk was perhaps the major component to her problem. Myelogram and CT scan were performed on January 23. There was felt to be a mild diffuse disk bulge at L4-5 with possible herniation; however, the significant finding was on the myelogram where to my reading the L5 nerve root was compressed laterally with some indentation of the ______ column and there was an anterior extradural defect on the lateral projection. She failed to respond to an epidural steroid injection x2 and because of the fact that she still is in severe pain she is here today for the above mentioned surgery. See operative description below for additional pathology and details.  DESCRIPTION OF PROCEDURE:  Prophylactic antibiotics, satisfactory general anesthesia, prone position on rolls, back was prepped with Duraprep and with three spinal needles on lateral x-ray, I  was able to tentatively localize the L4-5 interspace. I then continued draping the back in a sterile field, Ioban employed, a vertical incision with the two spinous processes which we felt were L4 and L5 being tagged with Kocher clamps and a lateral x-ray taken confirming that these clamps were on the spinous processes of L4 and L5. Based on this, I then dissected the soft tissue off the lamina of L4 and L5 and placed a self retaining McCullough retractor. She had a typical appearing L4-5 interspace with shingling of bone and I then began removing a good bit of bone and ligamentum flavum with a combination of 2, 3 and 4 mm Kerrison rongeurs. She was extremely tight laterally and when progress was getting more difficult, we brought in the microscope and were able to complete a wide decompression and foraminotomy using a scope. She had a number of epidural veins which we cauterized with bipolar cautery. The disk was quite hard and would not admit a spinal needle on inspection. At the conclusion of the decompression, her L5 nerve root was lying very free without any impingement, bony or soft tissue. Gelfoam was placed in the bed of the wound and the wound was basically dry on closure. Self retaining retractors were removed and no unusual bleeding was encountered. I placed additional Gelfoam over the dura and closed the fascia with interrupted #1 Vicryl. The subcutaneous tissue was then infiltrated with 0.5% plain Marcaine and closed with 2-0 Vicryl and the skin with staples.  She was also given 30 mg of Toradol IV. The patient tolerated the procedure well and at the time of this dictation was on her way to the recovery room in satisfactory condition with no known complications. Dictated by:   Illene Labrador. Aplington, M.D. Attending Physician:  Joaquin Courts DD:  07/27/01 TD:  07/27/01 Job: [id number]

## 2010-10-09 NOTE — Consult Note (Signed)
NAMEVIKTORIYA, Higgins             ACCOUNT NO.:  [id number]   MEDICAL RECORD NO.:  [id number]          PATIENT TYPE:  INP   LOCATION:  3018                         FACILITY:  [facility]   PHYSICIAN:  Genene Churn. Love, M.D.    DATE OF BIRTH:  07/04/1940   DATE OF CONSULTATION:  06/12/2005  DATE OF DISCHARGE:                                   CONSULTATION   This 70 year old white married female, a patient of Corwin Levins, M.D., is  seen at the request of Dr. Posey Rea for evaluation of right-sided numbness.   HISTORY OF PRESENT ILLNESS:  Ms. Hoelting was seen in the emergency room this  day by Dr. Stacie Acres and Dr. Posey Rea with a history of developing vague  dizziness and right-sided numbness of the face more than arm and leg,  lasting 15 minutes, at 8 a.m. on June 12, 2005.  She had no associated  chest pain, palpitations or headache.  She came to the emergency room a CT  scan of the brain showing an area of decreased density in the right cerebral  peduncle and then had MRI showing evidence of a chronic hemorrhage and then  had an MRI showing evidence of a chronic hemorrhage and possibly an acute  hemorrhage in the right cerebral peduncle.  This did not exactly fit with  her symptoms.  She had no associated headache with this, no chest pain, no  palpitations.  Neurology was called for further evaluation.  The patient has  risk factors of documented hypertension when she first came to the emergency  room, cigarette use of one pack per day, chronic obstructive pulmonary  disease.   She has a history of allergy to CODEINE.   She smokes a pack per day of cigarettes.   She uses Tramadol 50 mg q.i.d., amitriptyline 10 mg two q.h.s., and  Phenergan p.r.n. when she takes medications.   PAST MEDICAL HISTORY:  Significant for COPD, appendectomy, hysterectomy,  right L5 radiculopathy with foot drop following lumbar spine surgery in  2003.   PHYSICAL EXAMINATION:  GENERAL:  Examination revealed a  well-developed, thin  white female.  VITAL SIGNS:  Blood pressure in the right arm of 140/80, left arm 140/80,  heart rate was 88 and regular.  NECK:  There were no carotid or supraclavicular bruits heard.  Extension  maneuvers were unremarkable.  NEUROLOGIC:  Mental status:  She was alert and oriented x3.  Followed one,  two and three-step commands.  Cranial nerve examination revealed visual  fields full, discs flat, spontaneous venous pulsations seen.  Extraocular  movements full.  Corneals present and facial sensation equal.  No facial  motor asymmetry.  Hearing present, air conduction greater than bone  conduction.  Tongue midline, uvula midline, gag is present.  Sternocleidomastoid and trapezius testing normal.  Motor examination  revealed focal weakness in the right foot and leg with weakness in the 2/5  range in the EHL, 3-4/5 range in the anterior tibialis and peronei,  posterior tibialis on the right.  She had some weakness in the left leg on  the anterior tibialis and the peronei  muscle groups.  She had absent  reflexes in the upper and lower extremities.  She had altered sensation to  pinprick in her right knee to her ankle and also involving the bottom of her  foot.  Gait was not evaluated.   IMPRESSION:  1.  Right-sided numbness, code 782.0.  2.  Suspect left brain transient ischemic attack, code 435.9.  3.  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 Venetia Maxon, M.D., in 2001, most likely      representing a bleed into a cavernous angioma, code 431, possibly acute      bleed on the MRI but doubt, code 431.  4.  Right L5 radiculopathy, code 353.4.  5.  Left foot drop as well as right, possibly representing peroneal      neuropathy from crossing her legs, code 355.3.  6.  Chronic obstructive pulmonary disease, code 496.   PLAN:  The plan at this time is to admit this patient for TIA workup.  She  is not to be placed  on aspirin because of the question of some hemorrhage in  her cavernous angioma, but cavernous angioma localization does not  correspond to right body numbness and therefore is unlikely to be related to  her acute symptomatology.           ______________________________  Genene Churn. Sandria Manly, M.D.     JML/MEDQ  D:  06/12/2005  T:  06/14/2005  Job:  [id number]

## 2010-10-09 NOTE — Discharge Summary (Signed)
[facility]  Patient:    Alexis Higgins, Alexis Higgins Visit Number: [id number] MRN: [id number]          Service Type: SUR Location: 4W 0455 01 Attending Physician:  Marlowe Kays Page Dictated by:   Druscilla Brownie. Underwood III, P.A.-C. Admit Date:  08/17/2001 Discharge Date: 08/20/2001   CC:         Thyra Breed, M.D.   Discharge Summary  ADMITTING DIAGNOSIS:  Intractable pain control, status post lumbar laminectomy on July 27, 2001.  DISCHARGE DIAGNOSIS:  Intractable pain control, status post lumbar laminectomy on July 27, 2001, with improvement.  PROCEDURE:  None.  CONSULTS:  None.  BRIEF HISTORY:  This 70 year old lady with recent lumbar surgery on July 27, 2001, has had continuing and progressive pain.  She was seen on August 10, 2001, with pain.  It was radiating down to the right lower extremity primarily as well as the back.  She was extremely uncomfortable.  She was started on Neurontin which did not give her much relief, particularly in light of the fact that she only had one dose.  The patient was very agitated and had a high desire for some sort of help.  She presented to the emergency department for admission.  The patient had no bowel or bladder dysfunction and no headache and no nausea.  She was admitted essentially for observation and pain control.  HOSPITAL COURSE:  The patients laboratory values were essentially normal. White count was 7.6.  The CT scan with multiplanar reconstruction showed expected recent right L4-5 hemilaminectomy changes, probable degenerative disk disease stable since last radiograph of July 27, 2001, at L4-5.  No CT evidence for infectious discitis, endplate osteomyelitis at L4-5. Reconstruction essentially echoed the same.  We placed the patient at bed rest, gave her analgesics.  We decided to try a TENS unit as well as a steroid Dosepak.  She responded very nicely to that therapy.  We wanted a pain  control consult by Dr. Thyra Breed, but unfortunately, he does not make inpatient consults, and he will decide to have her follow with him on an outpatient basis.  She has markedly improved with her discomfort with the pain 3/10 with the TENS unit.  She was ambulating in the hall.  She, again, had no dysfunction in her bladder or bowel.  She did have a foot drop which was present preoperatively and was fitted with an ankle foot orthosis.  She felt she could be maintained in her home environment, and arrangements were made for discharge.  Laboratory values were completely within normal limits on the hemogram as well as routine chemistry.  CT scan was shown with results as mentioned above.  CONDITION ON DISCHARGE:  Improved, stable.  PLAN:  The patient is discharged to home.  FOLLOW-UP:  She has an appointment with Dr. Simonne Come next Thursday and will keep that appointment.  Continue with TENS unit at home and continue with her home medication.  She had a Dosepak which she will continue to take.  DISCHARGE MEDICATIONS: 1. For pain, we decided to give her Ultram 50 mg #60, 1-2 q.4-6h. p.r.n. pain. 2. Robaxin 500 mg #30 with a refill, 1 q.6h. p.r.n. muscle spasm. Dictated by:   Druscilla Brownie. Underwood III, P.A.-C. Attending Physician:  Joaquin Courts DD:  09/05/01 TD:  09/05/01 Job: [id number]
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
## 2010-10-09 NOTE — Procedures (Signed)
[place]  Patient:    Alexis Higgins, Alexis Higgins Visit Number: [id number] MRN: [id number]          Service Type: CAT Location: [facility] 2854 [id number] Attending Physician:  Nathen May Dictated by:   Nathen May, M.D., [facility] Proc. Date: 02/22/01 Admit Date:  02/22/2001   CC:         [place], Attention Theresa Mulligan, Attention [facility]   Procedure Report  PREOPERATIVE DIAGNOSIS:  Recurrent syncope.  POSTOPERATIVE DIAGNOSIS:  Recurrent syncope.  PROCEDURE:  Insertion of an implantable loop recorder.  DESCRIPTION OF PROCEDURE:  Following the obtaining of informed consent, the patient was brought to the electrophysiology laboratory and placed on the fluoroscopic table in the supine position.  After routine prep and drape of the upper chest, with previous external mapping by the loop recorder for P-wave/T-wave/QRS, an incision was made about 2 cm lateral to and 2 cm below the sternum and clavicle, respectively.  This was carried down to the layer of the prepectoral fascia, and a pocket was formed using sharp dissection. Hemostasis was obtained.  Two 2-0 silk sutures were then placed in the cephalad portion of the pocket and attached to a Medtronic Reveal Plus 9526 loop recorder, serial number [id number].  These sutures were then secured to the prepectoral fascia.  The pocket had been previously irrigated with antibiotic-containing saline solution.  The wound was then closed in two layers in the normal fashion.  The wound was washed, dried, and a benzoin and Steri-Strip dressing was then applied.  Needle counts, sponge counts, and instrument counts were correct at the end of the procedure according to the staff. Dictated by:   Nathen May, M.D., [facility] Attending Physician:  Nathen May DD:  02/22/01 TD:  02/22/01 Job: [id number]

## 2010-10-09 NOTE — H&P (Signed)
Alexis Higgins, Alexis Higgins             ACCOUNT NO.:  [id number]   MEDICAL RECORD NO.:  [id number]          PATIENT TYPE:  EMS   LOCATION:  MAJO                         FACILITY:  [facility]   PHYSICIAN:  Georgina Quint. Plotnikov, M.D. Romualdo Bolk OF BIRTH:  [birth year]   DATE OF ADMISSION:  06/12/2005  DATE OF DISCHARGE:                                HISTORY & PHYSICAL   CHIEF COMPLAINT:  1.  Almost passed out this morning.  2.  Numbness, weakness on the right side.   HISTORY OF PRESENT ILLNESS:  The patient is a 53female who felt tingly and  weak on the right side of her body, especially on the face, about 8:15 this  morning. At some point she felt like she was going to pass out, got quite  anxious.  The symptoms lasted for about an hour and then disappeared.  The  blood pressure was 188/something when EMS came to the house.  She showed an  old CVA on the CT scan.  MRI showed a bleed.   PAST MEDICAL HISTORY:  1.  In 2001, cerebral hemorrhage.  As I understand it, Dr. Venetia Maxon was      consulted.  2.  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  3.  COPD with recent bronchitis treated with prednisone and antibiotics.   MEDICATIONS:  1.  Phenergan p.r.n.  2.  Ultram for pain p.r.n.  3.  Amitriptyline for sleep.  4.  Prednisone.  5.  Antibiotic finished.   ALLERGIES:  CODEINE.   SOCIAL HISTORY:  She is a disabled Environmental health practitioner.  She is a  smoker.   FAMILY HISTORY:  A sister with heart problems.  Another sister with enlarged  heart.  One brother died with MI.   REVIEW OF SYSTEMS:  No neurologic symptoms, no headache.  No chest pain or  shortness of breath.  No other syncopal spells.  Chronic low back pain with  right leg pain and weakness.  The rest is negative.   PHYSICAL EXAMINATION:  VITAL SIGNS:  Temperature 98.1, blood pressure  142/80, heart rate 83, respirations 20, O2 saturation 97% on room air.  GENERAL:  She is in no acute distress.  HEENT:  Pupils  reactive.  Moist mucosa.  NECK:  Supple. No thyromegaly or bruit.  LUNGS: Decreased breath sounds, no wheezes or rales.  HEART: S1, S2. No murmur, no gallop.  ABDOMEN:  Soft, nontender, no organomegaly, no mass felt.  EXTREMITIES:  Lower extremities without edema.  Calves nontender.  She is  alert and oriented, cooperative.   LABORATORY DATA:  Urinalysis normal.  White count 10.3, hemoglobin 13.4.  Fibrinogen 0.3.  Sodium 141, potassium 3.6, glucose 131, creatinine 0.8.  ALT and AST normal.   CT of the brain with small focal area right cerebral peduncle with chronic  microvascular disease.   MRI: Acute on chronic right cerebral peduncle lesion.   ASSESSMENT AND PLAN:  1.  Acute on chronic right cerebral peduncle lesion by CT and MRI.  No      aneurysm noted on current test.  Neurology consultation, Dr. Sandria Manly, to  help with further workup.  Will obtain sed rate, SPEP, TSH, and others.  2.  Near syncopal spell due to Problem #1.  Will admit to telemetry.  3.  Paresthesia, right sided, resolved.  4.  Chronic obstructive pulmonary disease with recent bronchitis.  5.  Possible hypertension.  6.  Ongoing tobacco smoking.  7.  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  8.  Microvascular cerebral vascular disease.           ______________________________  Georgina Quint. Plotnikov, M.D. [facility]     AVP/MEDQ  D:  06/12/2005  T:  06/13/2005  Job:  [id number]   cc:   Corwin Levins, M.D. [facility]  [address]  [place]  [place] [address]

## 2010-10-09 NOTE — Discharge Summary (Signed)
Alexis Higgins, Alexis Higgins             ACCOUNT NO.:  [id number]   MEDICAL RECORD NO.:  [id number]          PATIENT TYPE:  INP   LOCATION:  3018                         FACILITY:  [facility]   PHYSICIAN:  Landry Corporal, M.D.        DATE OF BIRTH:  12-03-1940   DATE OF ADMISSION:  06/12/2005  DATE OF DISCHARGE:  06/15/2005                                 DISCHARGE SUMMARY   DISCHARGE DIAGNOSES:  1.  Right brain transient ischemic attack.  2.  Right cerebral peduncle hemangioma.  3.  Chronic obstructive pulmonary disease/tobacco abuse.  4.  Elevated triglycerides.   HISTORY OF PRESENT ILLNESS:  The patient is a 70 year old white female  admitted on June 12, 2005 with complaints of feeling tingly and weak on  the right side of her body, especially on her face.  The patient was brought  to the emergency department via EMS and was admitted for further evaluation.   PAST MEDICAL HISTORY:  1.  2001, cerebral hemorrhage.  2.  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  3.  COPD with recent bronchitis treated with prednisone and antibiotic.   HOSPITAL COURSE:  1.  TIA:  The patient was admitted and underwent a CT of the head which      showed a right cerebral peduncle and a questionable area of hemorrhage      as well as a right frontal chronic white matter disease.  In addition      the MRI of the brain showed a focal lesion with a right cerebral      peduncle (cavernous hemangioma), questionable acute on chronic      hemorrhage and chronic microvascular ischemia.  A neuro consult was      obtained via stroke team and the patient was seen by Dr. Sandria Manly.  It was      recommended that the patient remain off aspirin for four weeks.  The      patient was started on Foltx secondary to an elevated  homocystine      level.  A 2-D echo was performed which was within normal limits and      carotid Doppler showed no significant ICA stenosis.   1.  ELEVATED TRIGLYCERIDES:  The patient was  noted to have a triglyceride      level of 269.  She was counseled on diet modification.  Will need repeat      fasting lipid profile in one month and consider adding lipid lowering      agent if still elevated.   DISCHARGE MEDICATIONS:  1.  Elavil 50 mg at bedtime as before.  2.  Foltx one tablet p.o. daily.  3.  Aspirin to be started in four weeks per neurology team.  4.  Tramadol 50 mg every six hours as needed.  5.  Phenergan 25 mg every six hours as needed.   LABORATORY DATA:  At discharge:  BUN 7, creatinine 0.8.  Hemoglobin A1c 5.2,  homocystine 18.7.  HDL 60, triglycerides 269, total cholesterol 197, LDL 83,  VLDL 54.  TSH 1.223.  FOLLOW UP:  The patient is instructed to follow up with Dr. Oliver Barre in 1-  2 weeks and call for an appointment.  She is also instructed to follow up  with Alexis Higgins, nurse practitioner from neurology, in four weeks and call  for an appointment.   DISPOSITION:  Discharge patient to home.  She is counseled on smoking  cessation and avoidance of concentrated sweets and high fat foods.  She was  instructed to call Dr. Jonny Ruiz should she develop weakness, fever over 101, or  slurred speech.      [name] S. Peggyann Juba, NP    ______________________________  Landry Corporal, M.D.    MSO/MEDQ  D:  06/15/2005  T:  06/15/2005  Job:  [id number]   cc:   Alexis Higgins, M.D. [facility]  [address]  [place]  [place] [address]   Alexis Higgins, [place].P.

## 2010-12-07 ENCOUNTER — Telehealth: Payer: Self-pay | Admitting: Physician Assistant

## 2010-12-07 NOTE — Telephone Encounter (Signed)
(  Late entry from 7/14)  Returned call from pt concerning taking an extra dose of her ASA 81 mg, Plavix and lisinopril.  She confused her morning meds for her evening meds.  She was calling for advice.  I have discussed signs of bleeding with the pt.  She voiced understanding and will present to the ER if these arise.  Pt appreciated the call back.

## 2010-12-17 ENCOUNTER — Telehealth: Payer: Self-pay | Admitting: *Deleted

## 2010-12-17 NOTE — Telephone Encounter (Signed)
Patient requesting excuse from jury duty.

## 2010-12-17 NOTE — Telephone Encounter (Signed)
Completed letter and called patient to pickup at front desk at convenience.

## 2010-12-17 NOTE — Telephone Encounter (Signed)
Ok with me - robin to handle 

## 2010-12-25 ENCOUNTER — Other Ambulatory Visit: Payer: Self-pay | Admitting: Internal Medicine

## 2011-01-07 ENCOUNTER — Encounter: Payer: Self-pay | Admitting: Internal Medicine

## 2011-01-07 DIAGNOSIS — J449 Chronic obstructive pulmonary disease, unspecified: Secondary | ICD-10-CM

## 2011-01-07 DIAGNOSIS — R7302 Impaired glucose tolerance (oral): Secondary | ICD-10-CM | POA: Insufficient documentation

## 2011-01-07 DIAGNOSIS — Z0000 Encounter for general adult medical examination without abnormal findings: Secondary | ICD-10-CM | POA: Insufficient documentation

## 2011-01-07 HISTORY — DX: Impaired glucose tolerance (oral): R73.02

## 2011-01-08 ENCOUNTER — Other Ambulatory Visit (INDEPENDENT_AMBULATORY_CARE_PROVIDER_SITE_OTHER): Payer: Medicare Other

## 2011-01-08 ENCOUNTER — Encounter: Payer: Self-pay | Admitting: Internal Medicine

## 2011-01-08 ENCOUNTER — Telehealth: Payer: Self-pay | Admitting: *Deleted

## 2011-01-08 ENCOUNTER — Ambulatory Visit (INDEPENDENT_AMBULATORY_CARE_PROVIDER_SITE_OTHER): Payer: Medicare Other | Admitting: Internal Medicine

## 2011-01-08 VITALS — BP 110/72 | HR 75 | Temp 98.6°F | Ht 66.0 in | Wt 127.6 lb

## 2011-01-08 DIAGNOSIS — L309 Dermatitis, unspecified: Secondary | ICD-10-CM

## 2011-01-08 DIAGNOSIS — Z0000 Encounter for general adult medical examination without abnormal findings: Secondary | ICD-10-CM

## 2011-01-08 DIAGNOSIS — H60399 Other infective otitis externa, unspecified ear: Secondary | ICD-10-CM

## 2011-01-08 DIAGNOSIS — Z515 Encounter for palliative care: Secondary | ICD-10-CM | POA: Insufficient documentation

## 2011-01-08 DIAGNOSIS — R42 Dizziness and giddiness: Secondary | ICD-10-CM

## 2011-01-08 DIAGNOSIS — R7309 Other abnormal glucose: Secondary | ICD-10-CM

## 2011-01-08 DIAGNOSIS — J069 Acute upper respiratory infection, unspecified: Secondary | ICD-10-CM

## 2011-01-08 DIAGNOSIS — R7302 Impaired glucose tolerance (oral): Secondary | ICD-10-CM

## 2011-01-08 DIAGNOSIS — Z79899 Other long term (current) drug therapy: Secondary | ICD-10-CM

## 2011-01-08 DIAGNOSIS — L719 Rosacea, unspecified: Secondary | ICD-10-CM

## 2011-01-08 DIAGNOSIS — E785 Hyperlipidemia, unspecified: Secondary | ICD-10-CM

## 2011-01-08 DIAGNOSIS — H6090 Unspecified otitis externa, unspecified ear: Secondary | ICD-10-CM | POA: Insufficient documentation

## 2011-01-08 DIAGNOSIS — I10 Essential (primary) hypertension: Secondary | ICD-10-CM

## 2011-01-08 HISTORY — DX: Rosacea, unspecified: L71.9

## 2011-01-08 HISTORY — DX: Dermatitis, unspecified: L30.9

## 2011-01-08 LAB — CBC WITH DIFFERENTIAL/PLATELET
Basophils Absolute: 0.1 10*3/uL (ref 0.0–0.1)
Eosinophils Absolute: 0.6 10*3/uL (ref 0.0–0.7)
Eosinophils Relative: 7.4 % — ABNORMAL HIGH (ref 0.0–5.0)
MCV: 84.6 fl (ref 78.0–100.0)
Monocytes Absolute: 0.5 10*3/uL (ref 0.1–1.0)
Neutrophils Relative %: 57.9 % (ref 43.0–77.0)
Platelets: 274 10*3/uL (ref 150.0–400.0)
WBC: 8 10*3/uL (ref 4.5–10.5)

## 2011-01-08 LAB — URINALYSIS, ROUTINE W REFLEX MICROSCOPIC
Bilirubin Urine: NEGATIVE
Leukocytes, UA: NEGATIVE
Nitrite: NEGATIVE
Urobilinogen, UA: 1 (ref 0.0–1.0)
pH: 6 (ref 5.0–8.0)

## 2011-01-08 LAB — HEPATIC FUNCTION PANEL
ALT: 7 U/L (ref 0–35)
AST: 15 U/L (ref 0–37)
Albumin: 3.8 g/dL (ref 3.5–5.2)
Total Bilirubin: 0.5 mg/dL (ref 0.3–1.2)

## 2011-01-08 LAB — TSH: TSH: 0.97 u[IU]/mL (ref 0.35–5.50)

## 2011-01-08 LAB — BASIC METABOLIC PANEL
BUN: 8 mg/dL (ref 6–23)
Chloride: 105 mEq/L (ref 96–112)
Glucose, Bld: 83 mg/dL (ref 70–99)
Potassium: 4.6 mEq/L (ref 3.5–5.1)

## 2011-01-08 MED ORDER — LOVASTATIN 40 MG PO TABS: 40.0000 mg | ORAL_TABLET | Freq: Every day | ORAL | Status: DC

## 2011-01-08 MED ORDER — NEOMYCIN-POLYMYXIN-HC 3.5-10000-1 OT SOLN: 3.0000 [drp] | Freq: Three times a day (TID) | OTIC | Status: AC

## 2011-01-08 MED ORDER — PNEUMOCOCCAL VAC POLYVALENT 25 MCG/0.5ML IJ INJ: 0.5000 mL | INJECTION | Freq: Once | INTRAMUSCULAR | Status: DC

## 2011-01-08 MED ORDER — ATORVASTATIN CALCIUM 20 MG PO TABS: 20.0000 mg | ORAL_TABLET | Freq: Every day | ORAL | Status: DC

## 2011-01-08 MED ORDER — AZITHROMYCIN 250 MG PO TABS: ORAL_TABLET | ORAL | Status: AC

## 2011-01-08 NOTE — Assessment & Plan Note (Addendum)
Mild right  - for cortisporin asd; Mild to mod, for antibx course,  to f/u any worsening symptoms or concerns

## 2011-01-08 NOTE — Telephone Encounter (Signed)
Ok to change to mevacor 40 qd - I will do rx

## 2011-01-08 NOTE — Patient Instructions (Addendum)
Please contact your insurance about the shingles shot;  If covered you can call for a nurse visit Please return at your convenience for the flu shot (call for a nurse visit for this) You had the pneumonia shot today Stop the simvastatin Start the lipitor Take all new medications as prescribed - the antibiotics Please go to LAB in the Basement for the blood and/or urine tests to be done today Please call the phone number [phone] (the PhoneTree System) for results of testing in 2-3 days;  When calling, simply dial the number, and when prompted enter the MRN number above (the Medical Record Number) and the # key, then the message should start. Please return for Further LAB only in 4 wks - the cholesterol and liver tests (and call the same number to find out results) Please return in 1 year for your yearly visit, or sooner if needed, with Lab testing done 3-5 days before

## 2011-01-08 NOTE — Telephone Encounter (Signed)
Patient requesting generic alternative to lipitor. Generic lipitor is still too expensive at 70 dollars a month

## 2011-01-08 NOTE — Assessment & Plan Note (Signed)

## 2011-01-08 NOTE — Assessment & Plan Note (Signed)
BP Readings from Last 3 Encounters:  01/08/11 110/72  09/03/10 115/70  02/23/10 138/72   stable overall by hx and exam, most recent data reviewed with pt, and pt to continue medical treatment as before

## 2011-01-08 NOTE — Progress Notes (Signed)
Subjective:    Patient ID: Alexis Higgins, female    DOB: [birth year], 70 y.o.   MRN: [id number]  HPI  Here with 3 days acute onset fever, facial  pressure, general weakness and malaise, and greenish d/c, with slight ST, but little to no cough and Pt denies chest pain, increased sob or doe, wheezing, orthopnea, PND, increased LE swelling, palpitations,  or syncope but has had recurrent dizziness as well for several weeks, some postural, and has had to sit to make symptom better. . Also with right ear pain radiating to the right lateral neck below the ear, after noting single episode of bleeding from the ear several days ago.Pt denies chest pain, increased sob or doe, wheezing, orthopnea, PND, increased LE swelling, palpitations, dizziness or syncope.  Pt denies new neurological symptoms such as new headache, or facial or extremity weakness or numbness   Pt denies polydipsia, polyuria.  Also husbnad has been concerned with some cognitive difficulty/ST memory issue, general weakness/difficutly standing up from sitting, and hair thinning in clumps and requests trial off or change of the statin.  No constipation and has cont'd to take the med.  They are looking forward to being off the plavix after one yr tx completion, at least more affordable now.  No overt bleeding or bruising.  Not taking the fosamax due to neg lay press regarding possible side effect such as GI.   Past Medical History  Diagnosis Date  . Impaired glucose tolerance 01/07/2011  . ANXIETY 01/01/2007  . BURSITIS, RIGHT HIP 06/04/2009  . CAD, NATIVE VESSEL 01/15/2009  . CHEST PAIN-PRECORDIAL 01/15/2009  . COPD 01/01/2007  . DEPRESSION 01/01/2007  . GROIN PAIN 06/20/2008  . Headache 01/01/2007  . HYPERTENSION 01/01/2007  . LOW BACK PAIN 01/01/2007  . Muscle weakness (generalized) 06/04/2009  . OSTEOARTHRITIS, HIP 07/01/2008  . OSTEOPOROSIS 01/01/2007  . Other symptoms involving cardiovascular system 01/15/2009  . Preventative health care 01/07/2011    . SYNCOPE 01/01/2007  . TRANSIENT ISCHEMIC ATTACK, HX OF 01/01/2007  . Eczema 01/08/2011  . Rosacea 01/08/2011   Past Surgical History  Procedure Date  . Appendectomy   . Abdominal hysterectomy   . Oophorectomy     one ovary  . Sp lumbar disc surgury     Dr. Leslee Home  . S/p bilat cataract 2010    reports that she has quit smoking. She does not have any smokeless tobacco history on file. She reports that she does not drink alcohol. Her drug history not on file. family history includes Heart disease in her brother and Osteoporosis in her sister. Allergies  Allergen Reactions  . Aspirin     REACTION: cns bleed risk  . Codeine    Current Outpatient Prescriptions on File Prior to Visit  Medication Sig Dispense Refill  . albuterol (PROVENTIL HFA) 108 (90 BASE) MCG/ACT inhaler Inhale 2 puffs into the lungs every 6 (six) hours as needed.        Marland Kitchen amitriptyline (ELAVIL) 10 MG tablet 2 tabs po qhs      . aspirin 81 MG tablet Take 81 mg by mouth daily.        . Cholecalciferol (VITAMIN D) 1000 UNITS capsule Take 1,000 Units by mouth daily.        Marland Kitchen lisinopril (PRINIVIL,ZESTRIL) 10 MG tablet Take 10 mg by mouth daily.        . metoprolol tartrate (LOPRESSOR) 25 MG tablet TAKE 1/ 2 A TABLET BY MOUTH TWICE A DAY  30 tablet  6  . PLAVIX 75 MG tablet TAKE 1 TABLET BY MOUTH EVERY DAY WITH MEAL  30 tablet  6  . promethazine (PHENERGAN) 25 MG tablet Take 25 mg by mouth every 6 (six) hours as needed.        Marland Kitchen SPIRIVA HANDIHALER 18 MCG inhalation capsule USE 1 CAPSULE DAILY AS DIRECTED  30 each  4  . traMADol (ULTRAM) 50 MG tablet Take 50 mg by mouth every 6 (six) hours as needed.        Marland Kitchen alendronate (FOSAMAX) 70 MG tablet Take 70 mg by mouth every 7 (seven) days. Take with a full glass of water on an empty stomach.        No current facility-administered medications on file prior to visit.   Review of Systems Review of Systems  Constitutional: Negative for diaphoresis and unexpected weight  change.  HENT: Negative for drooling and tinnitus.   Eyes: Negative for photophobia and visual disturbance.  Respiratory: Negative for choking and stridor.   Gastrointestinal: Negative for vomiting and blood in stool.  Genitourinary: Negative for hematuria and decreased urine volume.     Objective:   Physical Exam BP 110/72  Pulse 75  Temp(Src) 98.6 F (37 C) (Oral)  Ht 5\' 6"  (1.676 m)  Wt 127 lb 9.6 oz (57.879 kg)  BMI 20.60 kg/m2  SpO2 95% Physical Exam  VS noted Constitutional: Pt appears well-developed and well-nourished.  HENT: Head: Normocephalic.  Right Ear: External ear normal.  Right ext canal 1+ swollen, with trace blood/scabbing Left Ear: External ear normal.  left ext canal normal, right TM marked erythema,  Bilat tm's mild erythema.  Sinus nontender.  Pharynx mild erythema Eyes: Conjunctivae and EOM are normal. Pupils are equal, round, and reactive to light.  Neck: Normal range of motion. Neck supple.  Cardiovascular: Normal rate and regular rhythm.   Pulmonary/Chest: Effort normal and breath sounds decreased bilat.  Neurological: Pt is alert. No cranial nerve deficit.  Skin: Skin is warm. No erythema.  Psychiatric: Pt behavior is normal. Thought content normal. 1+ nervous        Assessment & Plan:

## 2011-01-08 NOTE — Assessment & Plan Note (Signed)
Mild to mod, for antibx course,  to f/u any worsening symptoms or concerns 

## 2011-01-08 NOTE — Assessment & Plan Note (Signed)
stable overall by hx and exam, most recent data reviewed with pt, and pt to continue medical treatment as before  Lab Results  Component Value Date   HGBA1C  Value: 5.4 (NOTE) The ADA recommends the following therapeutic goal for glycemic control related to Hgb A1c measurement: Goal of therapy: <6.5 Hgb A1c  Reference: American Diabetes Association: Clinical Practice Recommendations 2010, Diabetes Care, 2010, 33: (Suppl  1). 01/07/2009

## 2011-01-08 NOTE — Assessment & Plan Note (Addendum)
Unclear etiology, no CP and not clearly simple vertigo;  ecg reviewed - sinus no acute;  for carotid and echo for further eval  Lab Results  Component Value Date   WBC 8.0 01/08/2011   HGB 12.8 01/08/2011   HCT 39.1 01/08/2011   PLT 274.0 01/08/2011   CHOL  Value: 133        ATP III CLASSIFICATION:  <200     mg/dL   Desirable  161-096  mg/dL   Borderline High  >=045    mg/dL   High        4/0/9811   TRIG 111 01/27/2010   HDL 64 01/27/2010   LDLDIRECT 139.7 06/20/2008   ALT 7 01/08/2011   AST 15 01/08/2011   NA 142 01/08/2011   K 4.6 01/08/2011   CL 105 01/08/2011   CREATININE 0.8 01/08/2011   BUN 8 01/08/2011   CO2 32 01/08/2011   TSH 0.97 01/08/2011   INR 0.91 01/26/2010   HGBA1C  Value: 5.4 (NOTE) The ADA recommends the following therapeutic goal for glycemic control related to Hgb A1c measurement: Goal of therapy: <6.5 Hgb A1c  Reference: American Diabetes Association: Clinical Practice Recommendations 2010, Diabetes Care, 2010, 33: (Suppl  1). 01/07/2009

## 2011-01-08 NOTE — Assessment & Plan Note (Addendum)
With some recent cognitive difficulty and LE weakness;  To stop the simvastatin, start the lipitor, f/u with labs 4 wks  Lab Results  Component Value Date   [facility]  Value: 47        Total Cholesterol/HDL:CHD Risk Coronary Heart Disease Risk Table                     Men   Women  1/2 Average Risk   3.4   3.3  Average Risk       5.0   4.4  2 X Average Risk   9.6   7.1  3 X Average Risk  23.4   11.0        Use the calculated Patient Ratio above and the CHD Risk Table to determine the patient's CHD Risk.        ATP III CLASSIFICATION (LDL):  <100     mg/dL   Optimal  096-045  mg/dL   Near or Above                    Optimal  130-159  mg/dL   Borderline  409-811  mg/dL   High  >914     mg/dL   Very High 11/29/2954

## 2011-01-08 NOTE — Progress Notes (Signed)
Quick Note:  Voice message left on PhoneTree system - lab is negative, normal or otherwise stable, pt to continue same tx ______ 

## 2011-01-11 NOTE — Telephone Encounter (Signed)
Pt advised of Rx/pharmacy 

## 2011-01-12 ENCOUNTER — Other Ambulatory Visit: Payer: Self-pay | Admitting: Cardiology

## 2011-01-12 DIAGNOSIS — R42 Dizziness and giddiness: Secondary | ICD-10-CM

## 2011-01-13 ENCOUNTER — Encounter (INDEPENDENT_AMBULATORY_CARE_PROVIDER_SITE_OTHER): Payer: Medicare Other | Admitting: Cardiology

## 2011-01-13 ENCOUNTER — Ambulatory Visit (HOSPITAL_COMMUNITY): Payer: Medicare Other | Attending: Internal Medicine

## 2011-01-13 DIAGNOSIS — R42 Dizziness and giddiness: Secondary | ICD-10-CM | POA: Insufficient documentation

## 2011-01-13 DIAGNOSIS — R5381 Other malaise: Secondary | ICD-10-CM | POA: Insufficient documentation

## 2011-01-13 DIAGNOSIS — I079 Rheumatic tricuspid valve disease, unspecified: Secondary | ICD-10-CM | POA: Insufficient documentation

## 2011-01-13 DIAGNOSIS — E785 Hyperlipidemia, unspecified: Secondary | ICD-10-CM | POA: Insufficient documentation

## 2011-01-13 DIAGNOSIS — I2510 Atherosclerotic heart disease of native coronary artery without angina pectoris: Secondary | ICD-10-CM | POA: Insufficient documentation

## 2011-01-13 DIAGNOSIS — R55 Syncope and collapse: Secondary | ICD-10-CM | POA: Insufficient documentation

## 2011-01-13 DIAGNOSIS — I6529 Occlusion and stenosis of unspecified carotid artery: Secondary | ICD-10-CM

## 2011-01-13 DIAGNOSIS — I369 Nonrheumatic tricuspid valve disorder, unspecified: Secondary | ICD-10-CM

## 2011-01-13 DIAGNOSIS — I059 Rheumatic mitral valve disease, unspecified: Secondary | ICD-10-CM | POA: Insufficient documentation

## 2011-01-13 DIAGNOSIS — I10 Essential (primary) hypertension: Secondary | ICD-10-CM | POA: Insufficient documentation

## 2011-01-13 DIAGNOSIS — R079 Chest pain, unspecified: Secondary | ICD-10-CM | POA: Insufficient documentation

## 2011-02-05 ENCOUNTER — Other Ambulatory Visit (INDEPENDENT_AMBULATORY_CARE_PROVIDER_SITE_OTHER): Payer: Medicare Other

## 2011-02-05 ENCOUNTER — Ambulatory Visit: Payer: Medicare Other | Admitting: Internal Medicine

## 2011-02-05 DIAGNOSIS — E785 Hyperlipidemia, unspecified: Secondary | ICD-10-CM

## 2011-02-05 DIAGNOSIS — Z79899 Other long term (current) drug therapy: Secondary | ICD-10-CM

## 2011-02-05 LAB — LIPID PANEL
HDL: 59.9 mg/dL (ref >39.00)
LDL Cholesterol: 66 mg/dL (ref 0–99)
Total CHOL/HDL Ratio: 3
Triglycerides: 146 mg/dL (ref 0.0–149.0)

## 2011-02-05 LAB — HEPATIC FUNCTION PANEL
Albumin: 3.6 g/dL (ref 3.5–5.2)
Total Bilirubin: 0.6 mg/dL (ref 0.3–1.2)

## 2011-02-06 ENCOUNTER — Other Ambulatory Visit: Payer: Self-pay | Admitting: Internal Medicine

## 2011-03-23 ENCOUNTER — Other Ambulatory Visit: Payer: Self-pay | Admitting: Physician Assistant

## 2011-03-23 DIAGNOSIS — R29898 Other symptoms and signs involving the musculoskeletal system: Secondary | ICD-10-CM

## 2011-03-23 DIAGNOSIS — M545 Low back pain: Secondary | ICD-10-CM

## 2011-03-29 ENCOUNTER — Other Ambulatory Visit: Payer: Self-pay

## 2011-03-29 ENCOUNTER — Other Ambulatory Visit: Payer: Self-pay | Admitting: Internal Medicine

## 2011-03-29 MED ORDER — METOPROLOL TARTRATE 25 MG PO TABS: 25.0000 mg | ORAL_TABLET | Freq: Two times a day (BID) | ORAL | Status: DC

## 2011-03-30 ENCOUNTER — Other Ambulatory Visit: Payer: Self-pay

## 2011-03-30 MED ORDER — METOPROLOL TARTRATE 25 MG PO TABS: 25.0000 mg | ORAL_TABLET | Freq: Two times a day (BID) | ORAL | Status: DC

## 2011-04-01 ENCOUNTER — Emergency Department (HOSPITAL_COMMUNITY)
Admission: EM | Admit: 2011-04-01 | Discharge: 2011-04-02 | Disposition: A | Discharge status: Home / Self Care | Payer: Medicare Other | Attending: Emergency Medicine | Admitting: Emergency Medicine

## 2011-04-01 ENCOUNTER — Encounter (HOSPITAL_COMMUNITY): Payer: Self-pay | Admitting: Emergency Medicine

## 2011-04-01 ENCOUNTER — Ambulatory Visit
Admission: RE | Admit: 2011-04-01 | Discharge: 2011-04-01 | Disposition: A | Discharge status: Home / Self Care | Payer: Medicare Other | Source: Ambulatory Visit | Attending: Physician Assistant | Admitting: Physician Assistant

## 2011-04-01 DIAGNOSIS — M1610 Unilateral primary osteoarthritis, unspecified hip: Secondary | ICD-10-CM | POA: Insufficient documentation

## 2011-04-01 DIAGNOSIS — J449 Chronic obstructive pulmonary disease, unspecified: Secondary | ICD-10-CM | POA: Insufficient documentation

## 2011-04-01 DIAGNOSIS — R29898 Other symptoms and signs involving the musculoskeletal system: Secondary | ICD-10-CM

## 2011-04-01 DIAGNOSIS — N390 Urinary tract infection, site not specified: Secondary | ICD-10-CM | POA: Insufficient documentation

## 2011-04-01 DIAGNOSIS — M545 Low back pain, unspecified: Secondary | ICD-10-CM

## 2011-04-01 DIAGNOSIS — Z79899 Other long term (current) drug therapy: Secondary | ICD-10-CM | POA: Insufficient documentation

## 2011-04-01 DIAGNOSIS — F341 Dysthymic disorder: Secondary | ICD-10-CM | POA: Insufficient documentation

## 2011-04-01 DIAGNOSIS — I2510 Atherosclerotic heart disease of native coronary artery without angina pectoris: Secondary | ICD-10-CM | POA: Insufficient documentation

## 2011-04-01 DIAGNOSIS — M169 Osteoarthritis of hip, unspecified: Secondary | ICD-10-CM | POA: Insufficient documentation

## 2011-04-01 DIAGNOSIS — I10 Essential (primary) hypertension: Secondary | ICD-10-CM | POA: Insufficient documentation

## 2011-04-01 DIAGNOSIS — M810 Age-related osteoporosis without current pathological fracture: Secondary | ICD-10-CM | POA: Insufficient documentation

## 2011-04-01 DIAGNOSIS — J4489 Other specified chronic obstructive pulmonary disease: Secondary | ICD-10-CM | POA: Insufficient documentation

## 2011-04-01 DIAGNOSIS — Z8673 Personal history of transient ischemic attack (TIA), and cerebral infarction without residual deficits: Secondary | ICD-10-CM | POA: Insufficient documentation

## 2011-04-01 DIAGNOSIS — R1110 Vomiting, unspecified: Secondary | ICD-10-CM | POA: Insufficient documentation

## 2011-04-01 LAB — CBC
HCT: 41.5 % (ref 36.0–46.0)
Hemoglobin: 14 g/dL (ref 12.0–15.0)
MCH: 28.1 pg (ref 26.0–34.0)
MCHC: 33.7 g/dL (ref 30.0–36.0)
MCV: 83.2 fL (ref 78.0–100.0)
Platelets: 263 10*3/uL (ref 150–400)
RBC: 4.99 MIL/uL (ref 3.87–5.11)
RDW: 13.4 % (ref 11.5–15.5)
WBC: 8 10*3/uL (ref 4.0–10.5)

## 2011-04-01 LAB — POCT I-STAT, CHEM 8
BUN: 7 mg/dL (ref 6–23)
Calcium, Ion: 1.15 mmol/L (ref 1.12–1.32)
Chloride: 105 mEq/L (ref 96–112)
Creatinine, Ser: 0.8 mg/dL (ref 0.50–1.10)
Glucose, Bld: 113 mg/dL — ABNORMAL HIGH (ref 70–99)
HCT: 42 % (ref 36.0–46.0)
Hemoglobin: 14.3 g/dL (ref 12.0–15.0)
Potassium: 3.7 mEq/L (ref 3.5–5.1)
Sodium: 144 mEq/L (ref 135–145)
TCO2: 28 mmol/L (ref 0–100)

## 2011-04-01 MED ORDER — ONDANSETRON HCL 4 MG/2ML IJ SOLN
4.0000 mg | Freq: Once | INTRAMUSCULAR | Status: AC
  Administered 2011-04-01: 4 mg via INTRAVENOUS
  Filled 2011-04-01: qty 2

## 2011-04-01 MED ORDER — IOHEXOL 180 MG/ML  SOLN
15.0000 mL | Freq: Once | INTRAMUSCULAR | Status: AC | PRN
  Administered 2011-04-01: 15 mL via INTRATHECAL

## 2011-04-01 MED ORDER — SODIUM CHLORIDE 0.9 % IV SOLN
INTRAVENOUS | Status: DC
  Administered 2011-04-02: 01:00:00 via INTRAVENOUS

## 2011-04-01 MED ORDER — DIAZEPAM 5 MG PO TABS
5.0000 mg | ORAL_TABLET | Freq: Once | ORAL | Status: AC
  Administered 2011-04-01: 5 mg via ORAL

## 2011-04-01 MED ORDER — SODIUM CHLORIDE 0.9 % IV BOLUS (SEPSIS)
1000.0000 mL | Freq: Once | INTRAVENOUS | Status: AC
  Administered 2011-04-01: 1000 mL via INTRAVENOUS

## 2011-04-01 MED ORDER — HYDROMORPHONE HCL PF 2 MG/ML IJ SOLN
1.5000 mg | Freq: Once | INTRAMUSCULAR | Status: AC
  Administered 2011-04-01: 1.5 mg via INTRAMUSCULAR

## 2011-04-01 MED ORDER — ONDANSETRON HCL 4 MG/2ML IJ SOLN
4.0000 mg | Freq: Once | INTRAMUSCULAR | Status: AC
  Administered 2011-04-01: 4 mg via INTRAMUSCULAR

## 2011-04-01 NOTE — Progress Notes (Signed)
Upon discharge, patient stated the Valium PO and Dilaudid IM/Zofran IM made her feel the best she has "in over a week."  "I can't wait to go home and get some sleep!"  Prescription given for Valium 5mg , #5, to get patient through the next 24 hours until she can resume her medications.  jkl

## 2011-04-01 NOTE — ED Notes (Signed)
Patient with headache after receiving a spinal earlier today.  Patient is nauseated at this time.

## 2011-04-01 NOTE — Progress Notes (Signed)
Patient back to nursing station after myelogram and CT.  States her "nerves" feel SO much better, though she hurting from procedure.  Looks much more relaxed and calm from when she arrived.  Heart rate down to 110 from 123. jkl

## 2011-04-01 NOTE — Progress Notes (Signed)
Patient feeling "really horrible" being off Tramadol, Phenergan and Amitriptyline for the past two days.  Isn't sleeping and is a lot of pain.  Informed patient she has to stay off these meds until tomorrow morning.  I told her I will give her Valium once consent is signed and that that should alleviate some of the withdrawal symptoms and discomfort.  jkl

## 2011-04-01 NOTE — ED Notes (Signed)
PT. ARRIVED FROM HOME S/P MYELOGRAM WITH LUMBAR PUNCTURE THIS MORNING AT [facility] - STARTED VOMITTING ( X8) AT HOME AFTER PROCEDURE .

## 2011-04-01 NOTE — Patient Instructions (Signed)
Myelogram and Lumbar Puncture Discharge Instructions  1. Go home and rest quietly for the next 24 hours.  It is important to lie flat for the next 24 hours.  Get up only to go to the restroom.  You may lie in the bed or on a couch on your back, your stomach, your left side or your right side.  You may have one pillow under your head.  You may have pillows between your knees while you are on your side or under your knees while you are on your back.  2. DO NOT drive today.  Recline the seat as far back as it will go, while still wearing your seat belt, on the way home.  3. You may get up to go to the bathroom as needed.  You may sit up for 10 minutes to eat.  You may resume your normal diet and medications unless otherwise indicated.  4. The incidence of headache, nausea, or vomiting is about 5% (one in 20 patients).  If you develop a headache, lie flat and drink plenty of fluids until the headache goes away.  Caffeinated beverages may be helpful.  If you develop severe nausea and vomiting or a headache that does not go away with flat bed rest, call [phone].  5. You may resume normal activities after your 24 hours of bed rest is over; however, do not exert yourself strongly or do any heavy lifting tomorrow.  6. Call your physician for a follow-up appointment.  The results of your myelogram will be sent directly to your physician by the following day.  7. If you have any questions or if complications develop after you arrive home, please call [phone].  Discharge instructions have been explained to the patient.  The patient, or the person responsible for the patient, fully understands these instructions.   Resume Amitriptyline, Tramadol and Phenergan/Promethazine on Friday, April 02, 2011 after 9:30a.m.

## 2011-04-02 ENCOUNTER — Encounter (HOSPITAL_COMMUNITY): Payer: Self-pay | Admitting: Emergency Medicine

## 2011-04-02 LAB — URINALYSIS, ROUTINE W REFLEX MICROSCOPIC
Glucose, UA: NEGATIVE mg/dL
Hgb urine dipstick: NEGATIVE
Specific Gravity, Urine: 1.017 (ref 1.005–1.030)
pH: 6.5 (ref 5.0–8.0)

## 2011-04-02 LAB — URINE MICROSCOPIC-ADD ON

## 2011-04-02 MED ORDER — ACETAMINOPHEN 325 MG PO TABS
ORAL_TABLET | ORAL | Status: AC
  Administered 2011-04-02: 650 mg
  Filled 2011-04-02: qty 2

## 2011-04-02 MED ORDER — ONDANSETRON HCL 4 MG/2ML IJ SOLN
INTRAMUSCULAR | Status: AC
  Administered 2011-04-02: 4 mg via INTRAVENOUS
  Filled 2011-04-02: qty 2

## 2011-04-02 MED ORDER — CIPROFLOXACIN HCL 500 MG PO TABS: 500.0000 mg | ORAL_TABLET | Freq: Two times a day (BID) | ORAL | Status: AC

## 2011-04-02 MED ORDER — ONDANSETRON HCL 4 MG PO TABS: 4.0000 mg | ORAL_TABLET | Freq: Four times a day (QID) | ORAL | Status: AC

## 2011-04-02 NOTE — ED Notes (Signed)
Pt still unable to void pt is aware of needed void 

## 2011-04-02 NOTE — ED Notes (Signed)
Pt is aware of urine sample is needed. Pt have call bell at hand and will notify staff once pt needs to void

## 2011-04-02 NOTE — ED Notes (Signed)
Patient still unable to provide urine sample.  Headache now 5/10 feels better

## 2011-04-02 NOTE — ED Provider Notes (Signed)
History     CSN: [id number] Arrival date & time: 04/01/2011 10:34 PM   First MD Initiated Contact with Patient 04/01/11 2300      Chief Complaint  Patient presents with  . Emesis    (Consider location/radiation/quality/duration/timing/severity/associated sxs/prior treatment) Patient is a 70 y.o. female presenting with vomiting. The history is provided by the patient and the spouse. No language interpreter was used.  Emesis  This is a new problem. The current episode started 6 to 12 hours ago. The problem occurs 2 to 4 times per day. The emesis has an appearance of stomach contents. There has been no fever. Pertinent negatives include no abdominal pain, no arthralgias, no chills, no cough, no diarrhea, no fever, no headaches, no myalgias, no sweats and no URI.  Had CT myelogram and LP earlier and was given dilaudid which always makes her vomit and has been vomiting since given meds and was referred in by Dr. Thamas Jaegers for concerns of dehydration.  No CP, SOB.  No diarrhea.    Past Medical History  Diagnosis Date  . Impaired glucose tolerance 01/07/2011  . ANXIETY 01/01/2007  . BURSITIS, RIGHT HIP 06/04/2009  . CAD, NATIVE VESSEL 01/15/2009  . CHEST PAIN-PRECORDIAL 01/15/2009  . COPD 01/01/2007  . DEPRESSION 01/01/2007  . GROIN PAIN 06/20/2008  . Headache 01/01/2007  . HYPERTENSION 01/01/2007  . LOW BACK PAIN 01/01/2007  . Muscle weakness (generalized) 06/04/2009  . OSTEOARTHRITIS, HIP 07/01/2008  . OSTEOPOROSIS 01/01/2007  . Other symptoms involving cardiovascular system 01/15/2009  . Preventative health care 01/07/2011  . SYNCOPE 01/01/2007  . TRANSIENT ISCHEMIC ATTACK, HX OF 01/01/2007  . Eczema 01/08/2011  . Rosacea 01/08/2011    Past Surgical History  Procedure Date  . Appendectomy   . Abdominal hysterectomy   . Oophorectomy     one ovary  . Sp lumbar disc surgury     Dr. Leslee Home  . S/p bilat cataract 2010  . Coronary stent placement     Family History  Problem Relation Age  of Onset  . Osteoporosis Sister   . Heart disease Brother     History  Substance Use Topics  . Smoking status: Former Games developer  . Smokeless tobacco: Not on file   Comment: quit march 2007  . Alcohol Use: No    OB History    Grav Para Term Preterm Abortions TAB SAB Ect Mult Living                  Review of Systems  Constitutional: Negative for fever, chills, activity change and fatigue.  HENT: Negative for facial swelling.   Eyes: Negative for discharge.  Respiratory: Negative for apnea and cough.   Cardiovascular: Negative for chest pain.  Gastrointestinal: Positive for vomiting. Negative for abdominal pain, diarrhea and abdominal distention.  Genitourinary: Negative for difficulty urinating.  Musculoskeletal: Negative for myalgias and arthralgias.  Neurological: Negative for headaches.  Hematological: Negative.   Psychiatric/Behavioral: Negative.     Allergies  Aspirin and Codeine  Home Medications   Current Outpatient Rx  Name Route Sig Dispense Refill  . ALBUTEROL SULFATE HFA 108 (90 BASE) MCG/ACT IN AERS Inhalation Inhale 2 puffs into the lungs every 6 (six) hours as needed. For shortness of breath    . ALENDRONATE SODIUM 70 MG PO TABS Oral Take 70 mg by mouth every 7 (seven) days. Take with a full glass of water on an empty stomach.     . AMITRIPTYLINE HCL 10 MG PO TABS Oral Take 20  mg by mouth at bedtime. 2 tabs po qhs    . ASPIRIN 81 MG PO TABS Oral Take 81 mg by mouth daily.      Marland Kitchen VITAMIN D 1000 UNITS PO CAPS Oral Take 1,000 Units by mouth daily.      Marland Kitchen LISINOPRIL 10 MG PO TABS  TAKE ONE TABLET BY MOUTH EVERY DAY 90 tablet 3  . LOVASTATIN 40 MG PO TABS Oral Take 1 tablet (40 mg total) by mouth daily. 90 tablet 3  . METOPROLOL TARTRATE 25 MG PO TABS Oral Take 1 tablet (25 mg total) by mouth 2 (two) times daily. 30 tablet 6    Take 1/2 Tablet twice Daily  . PLAVIX 75 MG PO TABS  TAKE 1 TABLET BY MOUTH EVERY DAY WITH MEAL 30 tablet 6  . PROMETHAZINE HCL 25 MG PO  TABS Oral Take 25 mg by mouth every 6 (six) hours as needed. For nausea    . SPIRIVA HANDIHALER 18 MCG IN CAPS  USE 1 CAPSULE DAILY AS DIRECTED 30 each 4  . TRAMADOL HCL 50 MG PO TABS Oral Take 50 mg by mouth every 6 (six) hours as needed. For pain      BP 133/65  Pulse 68  Temp(Src) 97.6 F (36.4 C) (Oral)  Resp 18  SpO2 100%  Physical Exam  Constitutional: She is oriented to person, place, and time. She appears well-developed and well-nourished. No distress.  HENT:  Head: Normocephalic and atraumatic.  Mouth/Throat: Oropharyngeal exudate present.  Eyes: EOM are normal. Pupils are equal, round, and reactive to light. Right eye exhibits no discharge. Left eye exhibits no discharge.  Neck: Neck supple.  Cardiovascular: Normal rate and regular rhythm.   Pulmonary/Chest: Effort normal and breath sounds normal. No respiratory distress. She has no wheezes.  Abdominal: Soft. Bowel sounds are normal. There is no tenderness. There is no rebound and no guarding.  Musculoskeletal: Normal range of motion. She exhibits no edema and no tenderness.  Neurological: She is alert and oriented to person, place, and time.  Skin: Skin is warm and dry.  Psychiatric: She has a normal mood and affect.    ED Course  Procedures (including critical care time)  Labs Reviewed  POCT I-STAT, CHEM 8 - Abnormal; Notable for the following:    Glucose, Bld 113 (*)    All other components within normal limits  CBC  I-STAT, CHEM 8  URINALYSIS, ROUTINE W REFLEX MICROSCOPIC  URINE CULTURE   Ct Lumbar Spine W Contrast  04/01/2011  *RADIOLOGY REPORT*  Clinical Data:  Low back pain extending to the right lower extremity.  Increasing radicular pain.  Right leg weakness.  MYELOGRAM INJECTION  Technique:  Informed consent was obtained from the patient prior to the procedure, including potential complications of headache, allergy, infection and pain.  A timeout procedure was performed. With the patient prone, the lower  back was prepped with Betadine. 1% Lidocaine was used for local anesthesia.  Lumbar puncture was performed at the left paramidline L2-3 level using a 22 gauge needle with return of clear CSF.  15 ml of Omnipaque 180was injected into the subarachnoid space .  IMPRESSION: Successful injection of  intrathecal contrast for myelography.  MYELOGRAM LUMBAR  Technique:  Following injection of intrathecal Omnipaque contrast, spine imaging in multiple projections was performed using fluoroscopy.  Fluoroscopy Time: 33 seconds .  Comparison:  MRI lumbar spine 05/21/2009.  Findings: Five non-rib bearing lumbar type vertebral bodies are present.  There is slight leftward  curvature of the lumbar spine, centered at L3.  There is significant loss of disc height at L4-5, as before.  The lumbar nerve roots fill normally on both sides. Slight retrolisthesis at L3-4 is stable.  There is mild disc bulging at L3-4 and L4-5.  The upright images demonstrate no significant change and disc bulging at L3-4 and L4-5.  These disc bulges are somewhat reduced with flexion.  IMPRESSION:  1.  Disc bulging at L3-4 and L4-5 without significant nerve root compromise. 2.  Significant loss of disc height at L4-5 is stable. 3.  The disc bulges at L3-4 and L4-5 are slightly reduced in flexion.  Alignment is maintained.  *RADIOLOGY REPORT*  CT MYELOGRAPHY LUMBAR SPINE  Technique:  CT imaging of the lumbar spine was performed after intrathecal contrast administration.  Multiplanar CT image reconstructions were also generated.  Findings:  The lumbar spine is imaged from midbody of T12-S2.  1-2 millimeters retrolisthesis present at L3-4.  Alignment is otherwise anatomic.  There is slight leftward curvature of the lumbar spine centered at L3.  AP alignment is otherwise anatomic.  A degenerated calcified disc is present at T12-L1.  Densely calcified portions have extruded into Schmorl's nodes at each end plate.  Limited imaging of the abdomen demonstrates  atherosclerotic calcifications in the aorta without aneurysm.  L1-2: Endplate Schmorl's nodes are present.  There is no significant posterior disc herniation.  No significant stenosis is present.  L2-3:  Endplate Schmorl's nodes are present.  Minimal disc bulging is present.  There is no significant stenosis.  L3-4:  There is some uncovering of the disc with a broad-based bulge.  Mild facet hypertrophy is evident.  There is no significant stenosis.  L4-5:  The patient is status post right hemilaminectomy.  Slight rightward disc bulging is similar to the prior exam.  There is no significant stenosis.  Mild facet hypertrophy is evident.  L5-S1:  Mild facet hypertrophy is worse on the left.  There is no significant disc herniation or stenosis.  IMPRESSION:  1.  Postoperative changes on the right at L4-5. 2.  Stable slight rightward disc bulging at L4-5 and facet hypertrophy without significant residual or recurrent stenosis. 3.  Minimal retrolisthesis at L3-4 with uncovering of the disc is similar to the prior exam.  There is no significant stenosis. 4.  Minimal facet hypertrophy at to L5-S1 without significant stenosis. 5.  Schmorl's nodes at T12-L1, L1-2, and L2-3 without exaggerated kyphosis. 6.  Atherosclerosis. 7.  Slight leftward curvature of the lumbar spine.  Original Report Authenticated By: Jamesetta Orleans. MATTERN, M.D.   Dg Myelogram Lumbar  04/01/2011  *RADIOLOGY REPORT*  Clinical Data:  Low back pain extending to the right lower extremity.  Increasing radicular pain.  Right leg weakness.  MYELOGRAM INJECTION  Technique:  Informed consent was obtained from the patient prior to the procedure, including potential complications of headache, allergy, infection and pain.  A timeout procedure was performed. With the patient prone, the lower back was prepped with Betadine. 1% Lidocaine was used for local anesthesia.  Lumbar puncture was performed at the left paramidline L2-3 level using a 22 gauge needle with  return of clear CSF.  15 ml of Omnipaque 180was injected into the subarachnoid space .  IMPRESSION: Successful injection of  intrathecal contrast for myelography.  MYELOGRAM LUMBAR  Technique:  Following injection of intrathecal Omnipaque contrast, spine imaging in multiple projections was performed using fluoroscopy.  Fluoroscopy Time: 33 seconds .  Comparison:  MRI lumbar  spine 05/21/2009.  Findings: Five non-rib bearing lumbar type vertebral bodies are present.  There is slight leftward curvature of the lumbar spine, centered at L3.  There is significant loss of disc height at L4-5, as before.  The lumbar nerve roots fill normally on both sides. Slight retrolisthesis at L3-4 is stable.  There is mild disc bulging at L3-4 and L4-5.  The upright images demonstrate no significant change and disc bulging at L3-4 and L4-5.  These disc bulges are somewhat reduced with flexion.  IMPRESSION:  1.  Disc bulging at L3-4 and L4-5 without significant nerve root compromise. 2.  Significant loss of disc height at L4-5 is stable. 3.  The disc bulges at L3-4 and L4-5 are slightly reduced in flexion.  Alignment is maintained.  *RADIOLOGY REPORT*  CT MYELOGRAPHY LUMBAR SPINE  Technique:  CT imaging of the lumbar spine was performed after intrathecal contrast administration.  Multiplanar CT image reconstructions were also generated.  Findings:  The lumbar spine is imaged from midbody of T12-S2.  1-2 millimeters retrolisthesis present at L3-4.  Alignment is otherwise anatomic.  There is slight leftward curvature of the lumbar spine centered at L3.  AP alignment is otherwise anatomic.  A degenerated calcified disc is present at T12-L1.  Densely calcified portions have extruded into Schmorl's nodes at each end plate.  Limited imaging of the abdomen demonstrates atherosclerotic calcifications in the aorta without aneurysm.  L1-2: Endplate Schmorl's nodes are present.  There is no significant posterior disc herniation.  No significant  stenosis is present.  L2-3:  Endplate Schmorl's nodes are present.  Minimal disc bulging is present.  There is no significant stenosis.  L3-4:  There is some uncovering of the disc with a broad-based bulge.  Mild facet hypertrophy is evident.  There is no significant stenosis.  L4-5:  The patient is status post right hemilaminectomy.  Slight rightward disc bulging is similar to the prior exam.  There is no significant stenosis.  Mild facet hypertrophy is evident.  L5-S1:  Mild facet hypertrophy is worse on the left.  There is no significant disc herniation or stenosis.  IMPRESSION:  1.  Postoperative changes on the right at L4-5. 2.  Stable slight rightward disc bulging at L4-5 and facet hypertrophy without significant residual or recurrent stenosis. 3.  Minimal retrolisthesis at L3-4 with uncovering of the disc is similar to the prior exam.  There is no significant stenosis. 4.  Minimal facet hypertrophy at to L5-S1 without significant stenosis. 5.  Schmorl's nodes at T12-L1, L1-2, and L2-3 without exaggerated kyphosis. 6.  Atherosclerosis. 7.  Slight leftward curvature of the lumbar spine.  Original Report Authenticated By: Jamesetta Orleans. MATTERN, M.D.     No diagnosis found.    MDM  Follow up with radiology and your family doctor regarding further headaches, return for fevers, chills, weakness, numbness, stiff neck or any concerns.  Patient verbalizes understanding and agrees to follow up        Heydi Swango Smitty Cords, MD 04/02/11 [phone]

## 2011-04-03 LAB — URINE CULTURE
Colony Count: 30000
Culture  Setup Time: 201211090327

## 2011-06-10 ENCOUNTER — Encounter: Payer: Self-pay | Admitting: Internal Medicine

## 2011-06-23 ENCOUNTER — Other Ambulatory Visit: Payer: Self-pay | Admitting: Internal Medicine

## 2011-06-23 MED ORDER — HYDROCODONE-HOMATROPINE 5-1.5 MG/5ML PO SYRP: 5.0000 mL | ORAL_SOLUTION | Freq: Four times a day (QID) | ORAL | Status: AC | PRN

## 2011-06-23 MED ORDER — AZITHROMYCIN 250 MG PO TABS: ORAL_TABLET | ORAL | Status: AC

## 2011-06-23 NOTE — Telephone Encounter (Signed)
Husband tx with same today

## 2011-07-14 ENCOUNTER — Telehealth: Payer: Self-pay | Admitting: Internal Medicine

## 2011-07-14 NOTE — Telephone Encounter (Signed)
Received copies from [facility],on 07/14/11. Forwarded 4pages to Dr. Candi Leash review.

## 2011-07-26 ENCOUNTER — Ambulatory Visit (AMBULATORY_SURGERY_CENTER): Payer: Medicare Other | Admitting: *Deleted

## 2011-07-26 ENCOUNTER — Encounter: Payer: Self-pay | Admitting: Internal Medicine

## 2011-07-26 VITALS — Ht 66.0 in | Wt 125.0 lb

## 2011-07-26 DIAGNOSIS — Z1211 Encounter for screening for malignant neoplasm of colon: Secondary | ICD-10-CM

## 2011-07-26 NOTE — Progress Notes (Signed)
Pt here for pv today for recall colonoscopy. Stated she has severe constipation, abdominal pain and blood in stool. She states that she only has bowel movements every 7-10 days taking colace 100 mg po per day. Colonoscopy for 08/09/11 cancelled and new patient appointment for 08/13/11 at 2pm scheduled with Dr. Leone Payor./TE

## 2011-08-09 ENCOUNTER — Other Ambulatory Visit: Payer: Medicare Other | Admitting: Internal Medicine

## 2011-08-13 ENCOUNTER — Encounter: Payer: Self-pay | Admitting: Internal Medicine

## 2011-08-13 ENCOUNTER — Other Ambulatory Visit (INDEPENDENT_AMBULATORY_CARE_PROVIDER_SITE_OTHER): Payer: Medicare Other

## 2011-08-13 ENCOUNTER — Ambulatory Visit (INDEPENDENT_AMBULATORY_CARE_PROVIDER_SITE_OTHER): Payer: Medicare Other | Admitting: Internal Medicine

## 2011-08-13 DIAGNOSIS — Z860100 Personal history of colon polyps, unspecified: Secondary | ICD-10-CM

## 2011-08-13 DIAGNOSIS — K625 Hemorrhage of anus and rectum: Secondary | ICD-10-CM

## 2011-08-13 DIAGNOSIS — K5900 Constipation, unspecified: Secondary | ICD-10-CM

## 2011-08-13 LAB — CBC WITH DIFFERENTIAL/PLATELET
Basophils Absolute: 0.1 10*3/uL (ref 0.0–0.1)
Eosinophils Relative: 8 % — ABNORMAL HIGH (ref 0.0–5.0)
HCT: 40.7 % (ref 36.0–46.0)
Lymphocytes Relative: 35.6 % (ref 12.0–46.0)
Lymphs Abs: 3.4 10*3/uL (ref 0.7–4.0)
Monocytes Relative: 8.1 % (ref 3.0–12.0)
Platelets: 297 10*3/uL (ref 150.0–400.0)
RDW: 13.8 % (ref 11.5–14.6)
WBC: 9.6 10*3/uL (ref 4.5–10.5)

## 2011-08-13 MED ORDER — PEG-KCL-NACL-NASULF-NA ASC-C 100 G PO SOLR: 1.0000 | Freq: Once | ORAL | Status: DC

## 2011-08-13 NOTE — Patient Instructions (Signed)
You have been scheduled for a colonoscopy. Please follow written instructions given to you at your visit today.  Please pick up your prep kit at the pharmacy within the next 1-3 days. Your physician has requested that you go to the basement for the following lab work before leaving today: CBC  To purge your bowels please do the following:  Take 4 Dulcolax capsules and then one hour later start Miralax. Take 6 doses within a 2 hour period.  After your bowels are cleaned out try the following to ward off the constipation: Take 1-2 doses of Miralax every day. And take one dose of Dulcolax every 2-3 days as needed for constipation.

## 2011-08-13 NOTE — Progress Notes (Signed)
  Subjective:    Patient ID: Alexis Higgins, female    DOB: [birth year], 71 y.o.   MRN: [id number]  HPI Patient is here with her husband. She presented for routine followup colonoscopy earlier this month, because of a history of adenomatous colon polyps. Last colonoscopy 2006 showing external hemorrhoids but no polyps. Was due in 2011 but had not presented until now. Previously had 6 polyps, maximum size 8 mm in 2003, with worst pathology being tubular adenomas.  Around November 2012 she developed change in bowel movements with severe constipation with small ball-like stools. This is associated with significant straining and bright red blood per rectum. She started a stool softener which did not seem to help too much. Now she'll have a bowel movement when she takes Dulcolax but she waits 7-10 days before using that and in between does not really get an urge to defecate. There's been some mild bloating and abdominal pain.  Medications, allergies, past medical history, past surgical history, family history and social history are reviewed and updated in the EMR.   Review of Systems Chronic spine and low back pain issues have worsened, she is not on narcotics but does take amitriptyline and tramadol. She is having increasing weakness in her legs and paresthesias. Denies any urinary problems. From 2010-2012 she did lose weight but has been stable since April 2012.    Objective:   Physical Exam General:  NAD Eyes:   anicteric Lungs:  clear Heart:  S1S2 no rubs, murmurs or gallops Abdomen:  soft and nontender, BS+ no hepatosplenomegaly or mass Rectal: Anoderm looks normal, resting sphincter tone slightly decreased, no mass scanty brown stool is present. No rectocele. Female staff presents for   examination. Ext:   no edema    Data Reviewed: Prior colonoscopy and pathology reports. Recent labs in the EMR.         Assessment & Plan:   1. Constipation   2. Rectal bleeding   3. Personal  history of colonic polyps    She needs a colonoscopy for all the above. I'm not sure why her bowels changed. Have not seen diverticulosis in the past. I am going to check a CBC because of the bleeding, she could be anemic with his chronic recurrent rectal bleeding. She was not in late 2012.  She will start MiraLax on a daily basis and use intermittent Dulcolax as outlined and the patient instructions. Prior to that she will have to a MiraLax purge with 4 Dulcolax followed by 6 glasses of MiraLax over 2 hours to get things started. I've explained this in detail to the patient and her husband.

## 2011-08-23 ENCOUNTER — Encounter: Payer: Self-pay | Admitting: Internal Medicine

## 2011-09-06 ENCOUNTER — Ambulatory Visit (AMBULATORY_SURGERY_CENTER): Payer: Medicare Other | Admitting: Internal Medicine

## 2011-09-06 ENCOUNTER — Encounter: Payer: Self-pay | Admitting: Internal Medicine

## 2011-09-06 VITALS — BP 114/68 | HR 70 | Temp 98.6°F | Resp 16 | Ht 67.0 in | Wt 126.0 lb

## 2011-09-06 DIAGNOSIS — D126 Benign neoplasm of colon, unspecified: Secondary | ICD-10-CM

## 2011-09-06 DIAGNOSIS — Z860100 Personal history of colon polyps, unspecified: Secondary | ICD-10-CM | POA: Insufficient documentation

## 2011-09-06 DIAGNOSIS — Z1211 Encounter for screening for malignant neoplasm of colon: Secondary | ICD-10-CM

## 2011-09-06 MED ORDER — SODIUM CHLORIDE 0.9 % IV SOLN: 500.0000 mL | INTRAVENOUS | Status: DC

## 2011-09-06 NOTE — Patient Instructions (Signed)

## 2011-09-06 NOTE — Progress Notes (Signed)
Patient did not experience any of the following events: a burn prior to discharge; a fall within the facility; wrong site/side/patient/procedure/implant event; or a hospital transfer or hospital admission upon discharge from the facility. (G8907) Patient did not have preoperative order for IV antibiotic SSI prophylaxis. (G8918)  

## 2011-09-06 NOTE — Op Note (Signed)
[facility] [address] [place], [place]  [address]  COLONOSCOPY PROCEDURE REPORT  PATIENT:  Alexis, Higgins  MR#:  [id number] BIRTHDATE:  [birth year], 71 yrs. old  GENDER:  female ENDOSCOPIST:  Iva Boop, MD, [facility]  PROCEDURE DATE:  09/06/2011 PROCEDURE:  Colonoscopy with biopsy ASA CLASS:  Class II INDICATIONS:  surveillance and high-risk screening, history of pre-cancerous (adenomatous) colon polyps 3 adenomas largest 8 mm index 2003 no polyps 2006 MEDICATIONS:   These medications were titrated to patient response per physician's verbal order, Versed 6 mg IV, Fentanyl 50 mcg IV  DESCRIPTION OF PROCEDURE:   After the risks benefits and alternatives of the procedure were thoroughly explained, informed consent was obtained.  Digital rectal exam was performed and revealed no abnormalities.   The LB CF-[id number] endoscope was introduced through the anus and advanced to the cecum, which was identified by both the appendix and ileocecal valve, without limitations.  The quality of the prep was excellent, using MoviPrep.  The instrument was then slowly withdrawn as the colon was fully examined. <<PROCEDUREIMAGES>>  FINDINGS:  A diminutive polyp was found in the cecum. It was 2 - 3 mm in size. The polyp was removed using cold biopsy forceps.  This was otherwise a normal examination of the colon. Includes right colon retroflexion.   Retroflexed views in the rectum revealed internal and external hemorrhoids.    The time to cecum = 3:07 minutes. The scope was then withdrawn in 8:50 minutes from the cecum and the procedure completed. COMPLICATIONS:  None ENDOSCOPIC IMPRESSION: 1) 2 - 3 mm diminutive polyp in the cecum - removed 2) Internal and external hemorrhoids 3) Otherwise normal examination - excellent prep 4) prior adenoma removal 2003  REPEAT EXAM:  In for Colonoscopy, pending biopsy results.  Iva Boop, MD, [facility]  CC:  The  Patient  n. eSIGNED:   Iva Boop at 09/06/2011 04:33 PM  Alexis Higgins, [id number]

## 2011-09-07 ENCOUNTER — Telehealth: Payer: Self-pay | Admitting: *Deleted

## 2011-09-07 NOTE — Telephone Encounter (Signed)
  Follow up Call-  Call back number 09/06/2011  Post procedure Call Back phone  # [phone]  Permission to leave phone message Yes     Patient questions:  Do you have a fever, pain , or abdominal swelling? no Pain Score  0 *  Have you tolerated food without any problems? yes  Have you been able to return to your normal activities? yes  Do you have any questions about your discharge instructions: Diet   no Medications  no Follow up visit  no  Do you have questions or concerns about your Care? no  Actions: * If pain score is 4 or above: No action needed, pain <4. PATIENT STILL SLEEPING THIS MORNING, HUSBAND ANSWERING FOLLOW UP QUESTIONS.

## 2011-09-13 ENCOUNTER — Encounter: Payer: Self-pay | Admitting: Internal Medicine

## 2011-09-13 NOTE — Progress Notes (Signed)
Quick Note:  Diminutive adenoma - routine repeat colonoscopy about 08/2016 ______

## 2011-10-29 ENCOUNTER — Telehealth: Payer: Self-pay | Admitting: Internal Medicine

## 2011-10-29 NOTE — Telephone Encounter (Signed)
Spoke with pt and advised i will fill her toprol RX, but she states she  missed her April appoint because she didn't know who she would be seeing as dr bensimhon is spreading his pt's throughout other doctors at [facility]--advised i would look into it and call back--due to fact debra mathis is off today i'm not able to ask about sched this pt, but will forward this message to her

## 2011-10-29 NOTE — Telephone Encounter (Signed)
PT NEEDS METOPROLOL REFILL [facility] [address], WAS FILLED BY PHARMACY AS AN EMERGENCY, JUST NEEDS AN OK, ALSO PT WANTS TO KNOW WHEN SHE NEEDS A FU APPT,  [phone]

## 2011-11-02 NOTE — Telephone Encounter (Signed)
Left message for the pt to call back.

## 2011-11-03 NOTE — Telephone Encounter (Signed)
F/u   Patient returning nurse DM call, she can be reached at [phone].

## 2011-11-03 NOTE — Telephone Encounter (Signed)
Spoke with pt, she will see dr Clifton James in follow up. He cathed her in 2011.

## 2011-11-22 ENCOUNTER — Ambulatory Visit (INDEPENDENT_AMBULATORY_CARE_PROVIDER_SITE_OTHER): Payer: Medicare Other | Admitting: Internal Medicine

## 2011-11-22 ENCOUNTER — Encounter: Payer: Self-pay | Admitting: Internal Medicine

## 2011-11-22 ENCOUNTER — Ambulatory Visit (INDEPENDENT_AMBULATORY_CARE_PROVIDER_SITE_OTHER)
Admission: RE | Admit: 2011-11-22 | Discharge: 2011-11-22 | Disposition: A | Discharge status: Home / Self Care | Payer: Medicare Other | Source: Ambulatory Visit | Attending: Internal Medicine | Admitting: Internal Medicine

## 2011-11-22 VITALS — BP 112/68 | HR 77 | Temp 97.1°F | Ht 66.0 in | Wt 126.4 lb

## 2011-11-22 DIAGNOSIS — R0600 Dyspnea, unspecified: Secondary | ICD-10-CM

## 2011-11-22 DIAGNOSIS — R0989 Other specified symptoms and signs involving the circulatory and respiratory systems: Secondary | ICD-10-CM

## 2011-11-22 DIAGNOSIS — I10 Essential (primary) hypertension: Secondary | ICD-10-CM

## 2011-11-22 DIAGNOSIS — R7309 Other abnormal glucose: Secondary | ICD-10-CM

## 2011-11-22 DIAGNOSIS — R7302 Impaired glucose tolerance (oral): Secondary | ICD-10-CM

## 2011-11-22 DIAGNOSIS — R0609 Other forms of dyspnea: Secondary | ICD-10-CM

## 2011-11-22 MED ORDER — LEVALBUTEROL TARTRATE 45 MCG/ACT IN AERO: 1.0000 | INHALATION_SPRAY | RESPIRATORY_TRACT | Status: DC | PRN

## 2011-11-22 MED ORDER — PREDNISONE 10 MG PO TABS: 10.0000 mg | ORAL_TABLET | Freq: Every day | ORAL | Status: DC

## 2011-11-22 MED ORDER — FLUTICASONE-SALMETEROL 250-50 MCG/DOSE IN AEPB: 1.0000 | INHALATION_SPRAY | Freq: Two times a day (BID) | RESPIRATORY_TRACT | Status: DC

## 2011-11-22 NOTE — Patient Instructions (Addendum)
Your oxygen level fell to 87% with walking today Your EKG was good today Take all new medications as prescribed  - the advair twice per day, prednisone as prescribed, xopenex (instead of the other albuterol inhaler) Continue all other medications as before Please go to XRAY in the Basement for the x-ray test You will be contacted regarding the referral for: Pulmonary Function tests You will be contacted by phone if any changes need to be made immediately.  Otherwise, you will receive a letter about your results with an explanation. Please return in 3 weeks

## 2011-11-22 NOTE — Assessment & Plan Note (Addendum)
Likely related to worsening COPD with ? Bronchospastic component/wheezing - has mild o2 sat desat on ambulatory today but declines home o2, ECG reviewed as per emr, cxr and PFT's next able;  Will have routine labs with next visit soon; also for predpack trial, advair 250/50 trial, change albut to xopenex asd to avoid nervousness , f/u 3 wks, ok to hold on labs today  Lab Results  Component Value Date   WBC 9.6 08/13/2011   HGB 13.6 08/13/2011   HCT 40.7 08/13/2011   MCV 83.2 08/13/2011   PLT 297.0 08/13/2011     Note:  o2 sat 94% rest, then 87% at 178ft walking in the office  Total time for pt hx and exam, review of record with pt in the office, determination of diagnosis and plan for further eval and tx is > 40 min

## 2011-11-23 ENCOUNTER — Telehealth: Payer: Self-pay

## 2011-11-23 NOTE — Telephone Encounter (Signed)
Ok to try PA - cannot take albuterol due to jitteriness/anxiety

## 2011-11-23 NOTE — Telephone Encounter (Signed)
Received PA on Xopenex inhaler, please advise

## 2011-11-23 NOTE — Telephone Encounter (Signed)
Called for PA. [phone] to initiate approval of medication.  Stated case is pending and would fax decision asap.

## 2011-11-24 NOTE — Telephone Encounter (Signed)
Received form MD completed and signed, faxed back to [employer] awaiting approval.

## 2011-11-27 ENCOUNTER — Encounter: Payer: Self-pay | Admitting: Internal Medicine

## 2011-11-27 NOTE — Progress Notes (Signed)
Subjective:    Patient ID: Alexis Higgins, female    DOB: May 24, 1941, 71 y.o.   MRN: [id number]  HPI  Here with husband;  C/o gradually worsening sob/doe in the past 1-2 months, now difficult to even walk from one end of the house to the other without need to stop to rest, with wheezing as well.;  Has known COPD, quit tobacco 2007;  Good compliance with spiriva, but not using the albuterol HFA as it makes her too nervous.  Exam today shows desaturation with ambulation on walking in the office.  Pt denies chest pain, orthopnea, PND, increased LE swelling, palpitations, dizziness or syncope.  Pt denies new neurological symptoms such as new headache, or facial or extremity weakness or numbness   Pt denies polydipsia, polyuria, fever, worsening cough or ST.   Past Medical History  Diagnosis Date  . Impaired glucose tolerance 01/07/2011  . ANXIETY 01/01/2007  . BURSITIS, RIGHT HIP 06/04/2009  . CAD, NATIVE VESSEL 01/15/2009  . CHEST PAIN-PRECORDIAL 01/15/2009  . COPD 01/01/2007  . DEPRESSION 01/01/2007  . GROIN PAIN 06/20/2008  . Headache 01/01/2007  . HYPERTENSION 01/01/2007  . LOW BACK PAIN 01/01/2007  . Muscle weakness (generalized) 06/04/2009  . OSTEOARTHRITIS, HIP 07/01/2008  . OSTEOPOROSIS 01/01/2007  . Other symptoms involving cardiovascular system 01/15/2009  . Preventative health care 01/07/2011  . SYNCOPE 01/01/2007  . TRANSIENT ISCHEMIC ATTACK, HX OF 01/01/2007  . Eczema 01/08/2011  . Rosacea 01/08/2011  . Colon polyps   . Hemorrhoid   . Tubular adenoma of colon   . Pneumonia 1998  . Cataract    Past Surgical History  Procedure Date  . Appendectomy   . Abdominal hysterectomy   . Oophorectomy     one ovary  . Sp lumbar disc surgury     Dr. Leslee Home  . S/p bilat cataract 2010  . Coronary stent placement   . Colonoscopy 01/25/2002    tubular adenoma,hemorrhoids, hyperplastic  colon polyps  . Colonoscopy 02/17/2005    hemorrhoids    reports that she quit smoking about 6 years ago. She  has never used smokeless tobacco. She reports that she does not drink alcohol or use illicit drugs. family history includes Colon cancer in an unspecified family member; Heart disease in her sister; Osteoporosis in her mother; and Seizures in her sister. Allergies  Allergen Reactions  . Aspirin Other (See Comments)    REACTION: cns bleed risk  . Codeine Rash   Current Outpatient Prescriptions on File Prior to Visit  Medication Sig Dispense Refill  . amitriptyline (ELAVIL) 10 MG tablet Take 20 mg by mouth at bedtime. 2 tabs po qhs      . aspirin 81 MG tablet Take 81 mg by mouth daily.        . Cholecalciferol (VITAMIN D) 1000 UNITS capsule Take 1,000 Units by mouth daily.        Marland Kitchen docusate sodium (COLACE) 100 MG capsule Take 100 mg by mouth daily.      Marland Kitchen lisinopril (PRINIVIL,ZESTRIL) 10 MG tablet TAKE ONE TABLET BY MOUTH EVERY DAY  90 tablet  3  . lovastatin (MEVACOR) 40 MG tablet Take 1 tablet (40 mg total) by mouth daily.  90 tablet  3  . metoprolol tartrate (LOPRESSOR) 25 MG tablet Take 25 mg by mouth 2 (two) times daily. Take 1/2 of a tablet 2 times a day      . ondansetron (ZOFRAN) 4 MG tablet Take 4 mg by mouth every 8 (eight) hours  as needed.       . polyethylene glycol powder (MIRALAX) powder Take 17 g by mouth daily.      Marland Kitchen SPIRIVA HANDIHALER 18 MCG inhalation capsule USE 1 CAPSULE DAILY AS DIRECTED  30 each  4  . traMADol (ULTRAM) 50 MG tablet Take 50 mg by mouth every 6 (six) hours as needed. For pain      . albuterol (PROVENTIL HFA) 108 (90 BASE) MCG/ACT inhaler Inhale 2 puffs into the lungs every 6 (six) hours as needed. For shortness of breath      . Fluticasone-Salmeterol (ADVAIR DISKUS) 250-50 MCG/DOSE AEPB Inhale 1 puff into the lungs 2 (two) times daily.  60 each  11  . levalbuterol (XOPENEX HFA) 45 MCG/ACT inhaler Inhale 1-2 puffs into the lungs every 4 (four) hours as needed for wheezing.  1 Inhaler  12   Review of Systems Review of Systems  Constitutional: Negative for  diaphoresis and unexpected weight change.  HENT: Negative for tinnitus.   Eyes: Negative for photophobia and visual disturbance.  Respiratory: Negative for choking and stridor.   Gastrointestinal: Negative for vomiting and blood in stool.  Genitourinary: Negative for hematuria and decreased urine volume.  Musculoskeletal: Negative for gait problem. except for the above Skin: Negative for color change and wound.  Neurological: Negative for tremors and numbness.  Psychiatric/Behavioral: Neg for worsening depressive symptoms    Objective:   Physical Exam BP 112/68  Pulse 77  Temp 97.1 F (36.2 C) (Oral)  Ht 5\' 6"  (1.676 m)  Wt 126 lb 6 oz (57.323 kg)  BMI 20.40 kg/m2  SpO2 92% Physical Exam  VS noted Constitutional: Pt appears well-developed and well-nourished.  HENT: Head: Normocephalic.  Right Ear: External ear normal.  Left Ear: External ear normal.  Eyes: Conjunctivae and EOM are normal. Pupils are equal, round, and reactive to light.  Neck: Normal range of motion. Neck supple.  Cardiovascular: Normal rate and regular rhythm.   Pulmonary/Chest: Effort normal and breath sounds decreased, no rales or wheezing  Abd:  Soft, NT, non-distended, + BS Neurological: Pt is alert. Not confused, motor/gait intact Skin: Skin is warm. No erythema.  Psychiatric: Pt behavior is normal. Thought content normal.     Assessment & Plan:

## 2011-11-27 NOTE — Assessment & Plan Note (Signed)
stable overall by hx and exam, most recent data reviewed with pt, and pt to continue medical treatment as before BP Readings from Last 3 Encounters:  11/22/11 112/68  09/06/11 114/68  08/13/11 100/58

## 2011-11-27 NOTE — Assessment & Plan Note (Signed)
stable overall by hx and exam, most recent data reviewed with pt, and pt to continue medical treatment as before  Lab Results  Component Value Date   HGBA1C  Value: 5.4 (NOTE) The ADA recommends the following therapeutic goal for glycemic control related to Hgb A1c measurement: Goal of therapy: <6.5 Hgb A1c  Reference: American Diabetes Association: Clinical Practice Recommendations 2010, Diabetes Care, 2010, 33: (Suppl  1). 01/07/2009    

## 2011-11-30 NOTE — Telephone Encounter (Signed)
refaxed PA form as no response at this time.

## 2011-12-02 ENCOUNTER — Ambulatory Visit (INDEPENDENT_AMBULATORY_CARE_PROVIDER_SITE_OTHER): Payer: Medicare Other | Admitting: Internal Medicine

## 2011-12-02 DIAGNOSIS — J449 Chronic obstructive pulmonary disease, unspecified: Secondary | ICD-10-CM

## 2011-12-02 DIAGNOSIS — R0600 Dyspnea, unspecified: Secondary | ICD-10-CM

## 2011-12-02 DIAGNOSIS — R0609 Other forms of dyspnea: Secondary | ICD-10-CM

## 2011-12-02 LAB — PULMONARY FUNCTION TEST

## 2011-12-02 NOTE — Progress Notes (Signed)
PFT done today. 

## 2011-12-06 NOTE — Telephone Encounter (Signed)
Received PA approval for this medication through May 23, 2012.  Letter will be faxed to our office to inform.

## 2011-12-10 ENCOUNTER — Telehealth: Payer: Self-pay

## 2011-12-10 DIAGNOSIS — R0600 Dyspnea, unspecified: Secondary | ICD-10-CM

## 2011-12-10 DIAGNOSIS — R0902 Hypoxemia: Secondary | ICD-10-CM

## 2011-12-10 DIAGNOSIS — F22 Delusional disorders: Secondary | ICD-10-CM

## 2011-12-10 DIAGNOSIS — J449 Chronic obstructive pulmonary disease, unspecified: Secondary | ICD-10-CM

## 2011-12-10 NOTE — Telephone Encounter (Signed)
Checked with PCC's and oxygen was not ordered from last OV on 11/22/11.  Please advise

## 2011-12-10 NOTE — Telephone Encounter (Signed)
PFT's showed severe COPD  She is on 3 inhalers including spiriva, advair, and albuterol  OK to refer to pulmonary  Also, robin to verify if she is on the home oxygen

## 2011-12-10 NOTE — Telephone Encounter (Signed)
Oxygen was ordered last visit - plesae check with PCC's

## 2011-12-10 NOTE — Telephone Encounter (Signed)
Patient informed of results.  She has already heard back for pulmonary appt. And will need to change but plans to do so due to schedule.  She is NOT on home oxygen

## 2011-12-10 NOTE — Telephone Encounter (Signed)
Done   - order to home health for 2L o2 continuous

## 2011-12-10 NOTE — Telephone Encounter (Signed)
Please ask pt if she will accept home o2

## 2011-12-10 NOTE — Telephone Encounter (Signed)
Called the patient back and she stated YES to oxygen that she would take anything at this time!!

## 2011-12-10 NOTE — Telephone Encounter (Signed)
Pt called stating that medicines prescribed are not helping with SOB sxs. Pt is also requesting results of PFT, please advise.

## 2011-12-14 ENCOUNTER — Telehealth: Payer: Self-pay

## 2011-12-14 ENCOUNTER — Encounter: Payer: Self-pay | Admitting: Cardiovascular Disease

## 2011-12-14 ENCOUNTER — Institutional Professional Consult (permissible substitution): Payer: Medicare Other | Admitting: Internal Medicine

## 2011-12-14 ENCOUNTER — Ambulatory Visit (INDEPENDENT_AMBULATORY_CARE_PROVIDER_SITE_OTHER): Payer: Medicare Other | Admitting: Cardiovascular Disease

## 2011-12-14 VITALS — BP 133/76 | HR 77 | Ht 66.0 in | Wt 126.0 lb

## 2011-12-14 DIAGNOSIS — I2510 Atherosclerotic heart disease of native coronary artery without angina pectoris: Secondary | ICD-10-CM

## 2011-12-14 NOTE — Telephone Encounter (Signed)
Message copied by Pincus Sanes on Tue Dec 14, 2011  3:04 PM ------      Message from: Corwin Levins      Created: Tue Dec 14, 2011 12:07 PM      Regarding: home o2       Please check with [facility] and pt if needed to make sure her home o2 is started

## 2011-12-14 NOTE — Assessment & Plan Note (Signed)
Stable. She is on good medical therapy. No chest pain. Lipids are at goal. BP is well controlled. She does not smoke any longer. Limited by dyspnea secondary to lung disease. She is getting home oxygen this week per Dr. Jonny Ruiz (O2 sats dropped to 81% in our office this am with ambulation).

## 2011-12-14 NOTE — Patient Instructions (Signed)
Your physician wants you to follow-up in:  6 months. You will receive a reminder letter in the mail two months in advance. If you don't receive a letter, please call our office to schedule the follow-up appointment.   

## 2011-12-14 NOTE — Progress Notes (Signed)
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 She has been followed in the past by Dr. Gala Romney. It is her first visit with me. Her cardiac history is significant for PCI LAD in 8/10 with bare metal stent.  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 She was admitted September 2011 with CP and found to he 70-80% ISR of bare metal stent in LAD. She underwent placement of DES within her previous stent in the LAD.   She is here today for follow up.   No chest pain. Continues with moderate dyspnea.  Improved with Spiriva and albuterol as needed. She dropped her oxygen sats with ambulation and is getting home O2 this week per Dr. Jonny Ruiz. Overall feels well.   Primary Care Physician: Oliver Barre  Last Lipid Profile:  Lipid Panel     Component Value Date/Time   CHOL 155 02/05/2011 0938   TRIG 146.0 02/05/2011 0938   HDL 59.90 02/05/2011 0938   CHOLHDL 3 02/05/2011 0938   VLDL 29.2 02/05/2011 0938   LDLCALC 66 02/05/2011 0938     Past Medical History  Diagnosis Date  . Impaired glucose tolerance 01/07/2011  . ANXIETY 01/01/2007  . BURSITIS, RIGHT HIP 06/04/2009  . CAD, NATIVE VESSEL 01/15/2009  . CHEST PAIN-PRECORDIAL 01/15/2009  . COPD 01/01/2007  . DEPRESSION 01/01/2007  . GROIN PAIN 06/20/2008  . Headache 01/01/2007  . HYPERTENSION 01/01/2007  . LOW BACK PAIN 01/01/2007  . Muscle weakness (generalized) 06/04/2009  . OSTEOARTHRITIS, HIP 07/01/2008  . OSTEOPOROSIS 01/01/2007  . Other symptoms involving cardiovascular system 01/15/2009  . Preventative health care 01/07/2011  . SYNCOPE 01/01/2007  . TRANSIENT ISCHEMIC ATTACK, HX OF 01/01/2007  . Eczema 01/08/2011  . Rosacea 01/08/2011  . Colon polyps   . Hemorrhoid   . Tubular adenoma of colon   . Pneumonia 1998  . Cataract     Past Surgical History  Procedure Date  . Appendectomy   . Abdominal hysterectomy   .  Oophorectomy     one ovary  . Sp lumbar disc surgury     Dr. Leslee Home  . S/p bilat cataract 2010  . Coronary stent placement   . Colonoscopy 01/25/2002    tubular adenoma,hemorrhoids, hyperplastic  colon polyps  . Colonoscopy 02/17/2005    hemorrhoids    Current Outpatient Prescriptions  Medication Sig Dispense Refill  . amitriptyline (ELAVIL) 10 MG tablet Take 20 mg by mouth at bedtime. 2 tabs po qhs      . aspirin 81 MG tablet Take 81 mg by mouth daily.        . Cholecalciferol (VITAMIN D) 1000 UNITS capsule Take 1,000 Units by mouth daily.        Marland Kitchen docusate sodium (COLACE) 100 MG capsule Take 100 mg by mouth daily.      . Fluticasone-Salmeterol (ADVAIR DISKUS) 250-50 MCG/DOSE AEPB Inhale 1 puff into the lungs 2 (two) times daily.  60 each  11  . levalbuterol (XOPENEX HFA) 45 MCG/ACT inhaler Inhale 1-2 puffs into the lungs every 4 (four) hours as needed for wheezing.  1 Inhaler  12  . lisinopril (PRINIVIL,ZESTRIL) 10 MG tablet TAKE ONE TABLET BY MOUTH EVERY DAY  90 tablet  3  . lovastatin (MEVACOR) 40 MG tablet Take 1 tablet (40 mg total) by mouth daily.  90 tablet  3  . metoprolol tartrate (LOPRESSOR) 25  MG tablet Take 25 mg by mouth 2 (two) times daily. Take 1/2 of a tablet 2 times a day      . ondansetron (ZOFRAN) 4 MG tablet Take 4 mg by mouth every 8 (eight) hours as needed.       . polyethylene glycol powder (MIRALAX) powder Take 17 g by mouth daily.      Marland Kitchen SPIRIVA HANDIHALER 18 MCG inhalation capsule USE 1 CAPSULE DAILY AS DIRECTED  30 each  4  . traMADol (ULTRAM) 50 MG tablet Take 50 mg by mouth every 6 (six) hours as needed. For pain        Allergies  Allergen Reactions  . Aspirin Other (See Comments)    REACTION: cns bleed risk  . Codeine Rash    History   Social History  . Marital Status: Married    Spouse Name: N/A    Number of Children: 4  . Years of Education: N/A   Occupational History  . disabled back since 2003    Social History Main Topics  . Smoking  status: Former Smoker    Quit date: 07/24/2005  . Smokeless tobacco: Never Used   Comment: quit march 2007  . Alcohol Use: No  . Drug Use: No  . Sexually Active: Not on file   Other Topics Concern  . Not on file   Social History Narrative  . No narrative on file    Family History  Problem Relation Age of Onset  . Osteoporosis Mother   . Heart disease Sister   . Colon cancer    . Seizures Sister     epilepsy  . Cardiomyopathy Sister   . Heart attack Sister     Review of Systems:  As stated in the HPI and otherwise negative.   BP 133/76  Pulse 77  Ht 5\' 6"  (1.676 m)  Wt 126 lb (57.153 kg)  BMI 20.34 kg/m2  SpO2 99%  Physical Examination: General: Well developed, well nourished, NAD HEENT: OP clear, mucus membranes moist SKIN: warm, dry. No rashes. Neuro: No focal deficits Musculoskeletal: Muscle strength 5/5 all ext Psychiatric: Mood and affect normal Neck: No JVD, no carotid bruits, no thyromegaly, no lymphadenopathy. Lungs:Clear bilaterally, no wheezes, rhonci, crackles Cardiovascular: Regular rate and rhythm. No murmurs, gallops or rubs. Abdomen:Soft. Bowel sounds present. Non-tender.  Extremities: No lower extremity edema. Pulses are 1 + in the bilateral DP/PT.

## 2011-12-14 NOTE — Telephone Encounter (Signed)
Faxed to [facility] Office notes requested to proceed with oxygen order. Faxed to [phone]

## 2011-12-14 NOTE — Telephone Encounter (Signed)
Called the patient and she does not have her oxygen.  States [facility] has not been provided with information from PCP. Patient stated she was seen by Cardiologist today and did oxygen stats today as well and dropped to 81%. Called [facility] and they need office notes from todays visit with her Cardiologist as well as previous office notes from Dr. Jonny Ruiz, they then can proceed with completing the home oxygen order.

## 2011-12-16 ENCOUNTER — Telehealth: Payer: Self-pay

## 2011-12-16 NOTE — Telephone Encounter (Signed)
Pt called requesting clarification from MD - should she continue using inhaler while on continuous O2?

## 2011-12-16 NOTE — Telephone Encounter (Signed)
Yes, ok to cont inhaler and o2

## 2011-12-16 NOTE — Telephone Encounter (Signed)
Patient informed. 

## 2011-12-29 ENCOUNTER — Other Ambulatory Visit: Payer: Self-pay | Admitting: Internal Medicine

## 2011-12-31 ENCOUNTER — Encounter: Payer: Self-pay | Admitting: Internal Medicine

## 2011-12-31 ENCOUNTER — Ambulatory Visit (INDEPENDENT_AMBULATORY_CARE_PROVIDER_SITE_OTHER): Payer: Medicare Other | Admitting: Internal Medicine

## 2011-12-31 VITALS — BP 88/52 | HR 78 | Temp 97.8°F | Ht 66.0 in | Wt 127.0 lb

## 2011-12-31 DIAGNOSIS — J449 Chronic obstructive pulmonary disease, unspecified: Secondary | ICD-10-CM

## 2011-12-31 DIAGNOSIS — I10 Essential (primary) hypertension: Secondary | ICD-10-CM

## 2011-12-31 NOTE — Progress Notes (Signed)
  Subjective:    Patient ID: Alexis Higgins, female    DOB: September 12, 1940  MRN: [id number]  HPI  73 yowf quit smoking in 2007 with dx of bronchitis 100% better and did not require much x for occ rescue meds then then placed on spirva for sob around 2010 and much worse sob spring of 2013 so referred to pulmonary clinic 12/31/2011 by Dr Jonny Ruiz.   12/31/2011 1st pulmonary eval cc doe x across the yard even on 02.   No unusual cough, purulent sputum or sinus/hb symptoms on present rx.  No variability.   Added advair already, no better saba better but can't tol shaking so got xopenex but hasn't used it Takes spiriva each pm  Sleeping ok without nocturnal  or early am exacerbation  of respiratory  c/o's or need for noct saba. Also denies any obvious fluctuation of symptoms with weather or environmental changes or other aggravating or alleviating factors except as outlined above      Review of Systems  Constitutional: Negative for fever, chills and unexpected weight change.  HENT: Positive for dental problem. Negative for ear pain, nosebleeds, congestion, sore throat, rhinorrhea, sneezing, trouble swallowing, voice change, postnasal drip and sinus pressure.   Eyes: Negative for visual disturbance.  Respiratory: Positive for shortness of breath. Negative for cough and choking.   Cardiovascular: Negative for chest pain and leg swelling.  Gastrointestinal: Negative for vomiting, abdominal pain and diarrhea.  Genitourinary: Negative for difficulty urinating.  Musculoskeletal: Negative for arthralgias.  Skin: Negative for rash.  Neurological: Negative for tremors, syncope and headaches.  Hematological: Does not bruise/bleed easily.       Objective:   Physical Exam  Elderly wf nad on 02 Wt Readings from Last 3 Encounters:  12/31/11 127 lb (57.607 kg)  12/14/11 126 lb (57.153 kg)  11/22/11 126 lb 6 oz (57.323 kg)    HEENT mild turbinate edema.  Oropharynx no thrush or excess pnd or cobblestoning.   No JVD or cervical adenopathy. Mild accessory muscle hypertrophy. Trachea midline, nl thryroid. Chest was hyperinflated by percussion with diminished breath sounds and moderate increased exp time without wheeze. Hoover sign positive at mid inspiration. Regular rate and rhythm without murmur gallop or rub or increase P2 or edema.  Abd: no hsm, nl excursion. Ext warm without cyanosis or clubbing.    11/22/11 CXR  Changes of COPD with minimal chronic blunting of the posterior left  costophrenic angle question related to pleural fluid or thickening.  No acute abnormalities.     Assessment & Plan:

## 2011-12-31 NOTE — Patient Instructions (Addendum)
Stop lisinopril and advair  Spiriva is used first thing each am  Only use your albuterol (xopenex) as a rescue medication to be used if you can't catch your breath by resting or doing a relaxed purse lip breathing pattern. The less you use it, the better it will work when you need it.   Please schedule a follow up office visit in 4 weeks, sooner if needed

## 2012-01-01 NOTE — Assessment & Plan Note (Signed)
-   PFTs 12/02/11  FEV1  0.93(42%) ratio 42 and DLC039%   - try off acei 01/01/2012    When respiratory symptoms begin or become refractory well after a patient reports complete smoking cessation,  Especially when this wasn't the case while they were smoking, a red flag is raised based on the work of Dr Primitivo Gauze which states:  if you quit smoking when your best day FEV1 is still well preserved it is highly unlikely you will progress to severe disease.  That is to say, once the smoking stops,  the symptoms should not suddenly erupt or markedly worsen.  If so, the differential diagnosis should include  obesity/deconditioning,  LPR/Reflux/Aspiration syndromes,  occult CHF, or  especially side effect of medications commonly used in this population.    I have no doubt she was a GOLD II/III before she quit smoking but note symptoms didn't really flare or become refractory until spring 2013  This strongly suggests an alternative explanation like an acei or adair effect > try off both then regroup

## 2012-01-01 NOTE — Assessment & Plan Note (Signed)
ACE inhibitors are problematic in  pts with airway complaints because  even experienced pulmonologists can't always distinguish ace effects from copd/asthma.  By themselves they don't actually cause a problem, much like oxygen can't by itself start a fire, but they certainly serve as a powerful catalyst or enhancer for any "fire"  or inflammatory process in the upper airway, be it caused by an ET  tube or more commonly reflux (especially in the obese or pts with known GERD or who are on biphoshonates).    since bp very low in office today will leave off acei completely for now

## 2012-01-06 ENCOUNTER — Ambulatory Visit (INDEPENDENT_AMBULATORY_CARE_PROVIDER_SITE_OTHER): Payer: Medicare Other | Admitting: Internal Medicine

## 2012-01-06 ENCOUNTER — Encounter: Payer: Self-pay | Admitting: Internal Medicine

## 2012-01-06 VITALS — BP 118/70 | HR 72 | Temp 97.6°F | Ht 66.0 in | Wt 128.2 lb

## 2012-01-06 DIAGNOSIS — R7302 Impaired glucose tolerance (oral): Secondary | ICD-10-CM

## 2012-01-06 DIAGNOSIS — R7309 Other abnormal glucose: Secondary | ICD-10-CM

## 2012-01-06 DIAGNOSIS — I10 Essential (primary) hypertension: Secondary | ICD-10-CM

## 2012-01-06 DIAGNOSIS — R221 Localized swelling, mass and lump, neck: Secondary | ICD-10-CM | POA: Insufficient documentation

## 2012-01-06 DIAGNOSIS — J449 Chronic obstructive pulmonary disease, unspecified: Secondary | ICD-10-CM

## 2012-01-06 MED ORDER — LEVOFLOXACIN 250 MG PO TABS: 250.0000 mg | ORAL_TABLET | Freq: Every day | ORAL | Status: AC

## 2012-01-06 NOTE — Assessment & Plan Note (Signed)
stable overall by hx and exam, most recent data reviewed with pt, and pt to continue medical treatment as before  Lab Results  Component Value Date   HGBA1C  Value: 5.4 (NOTE) The ADA recommends the following therapeutic goal for glycemic control related to Hgb A1c measurement: Goal of therapy: <6.5 Hgb A1c  Reference: American Diabetes Association: Clinical Practice Recommendations 2010, Diabetes Care, 2010, 33: (Suppl  1). 01/07/2009    

## 2012-01-06 NOTE — Assessment & Plan Note (Signed)
stable overall by hx and exam, most recent data reviewed with pt, and pt to continue medical treatment as before BP Readings from Last 3 Encounters:  01/06/12 118/70  12/31/11 88/52  12/14/11 133/76

## 2012-01-06 NOTE — Progress Notes (Signed)
Subjective:    Patient ID: Alexis Higgins, female    DOB: 10-22-1940, 71 y.o.   MRN: [id number]  HPI  Here with acute onset 3-4 days right side neck discomfort/swelling without fever, ST, cough and Pt denies chest pain, increased sob or doe, wheezing, orthopnea, PND, increased LE swelling, palpitations, dizziness or syncope.  Pt denies new neurological symptoms such as new headache, or facial or extremity weakness or numbness   Pt denies polydipsia, polyuria. Denies worsening reflux, dysphagia, abd pain, n/v, bowel change or blood.  No HA, sinus symptoms, no hoarseness. Overall good compliance with treatment, and good medicine tolerability.  Lopressor stopped due to low BP recetnly. Past Medical History  Diagnosis Date  . Impaired glucose tolerance 01/07/2011  . ANXIETY 01/01/2007  . BURSITIS, RIGHT HIP 06/04/2009  . CAD, NATIVE VESSEL 01/15/2009  . CHEST PAIN-PRECORDIAL 01/15/2009  . COPD 01/01/2007  . DEPRESSION 01/01/2007  . GROIN PAIN 06/20/2008  . Headache 01/01/2007  . HYPERTENSION 01/01/2007  . LOW BACK PAIN 01/01/2007  . Muscle weakness (generalized) 06/04/2009  . OSTEOARTHRITIS, HIP 07/01/2008  . OSTEOPOROSIS 01/01/2007  . Other symptoms involving cardiovascular system 01/15/2009  . Preventative health care 01/07/2011  . SYNCOPE 01/01/2007  . TRANSIENT ISCHEMIC ATTACK, HX OF 01/01/2007  . Eczema 01/08/2011  . Rosacea 01/08/2011  . Colon polyps   . Hemorrhoid   . Tubular adenoma of colon   . Pneumonia 1998  . Cataract    Past Surgical History  Procedure Date  . Appendectomy   . Abdominal hysterectomy   . Oophorectomy     one ovary  . Sp lumbar disc surgury     Dr. Leslee Home  . S/p bilat cataract 2010  . Coronary stent placement   . Colonoscopy 01/25/2002    tubular adenoma,hemorrhoids, hyperplastic  colon polyps  . Colonoscopy 02/17/2005    hemorrhoids    reports that she quit smoking about 6 years ago. Her smoking use included Cigarettes. She has a 50 pack-year smoking history.  She has never used smokeless tobacco. She reports that she does not drink alcohol or use illicit drugs. family history includes Cardiomyopathy in her sister; Colon cancer in an unspecified family member; Heart attack in her sister; Heart disease in her sister; Osteoporosis in her mother; and Seizures in her sister. Allergies  Allergen Reactions  . Aspirin Other (See Comments)    REACTION: cns bleed risk  . Codeine Rash   Current Outpatient Prescriptions on File Prior to Visit  Medication Sig Dispense Refill  . amitriptyline (ELAVIL) 10 MG tablet Take 20 mg by mouth at bedtime. 2 tabs po qhs      . aspirin 81 MG tablet Take 81 mg by mouth daily.        . Cholecalciferol (VITAMIN D) 1000 UNITS capsule Take 1,000 Units by mouth daily.        Marland Kitchen docusate sodium (COLACE) 100 MG capsule Take 100 mg by mouth daily.      Marland Kitchen levalbuterol (XOPENEX HFA) 45 MCG/ACT inhaler Inhale 1-2 puffs into the lungs every 4 (four) hours as needed for wheezing.  1 Inhaler  12  . lovastatin (MEVACOR) 40 MG tablet TAKE 1 TABLET BY MOUTH DAILY  90 tablet  3  . metoprolol tartrate (LOPRESSOR) 25 MG tablet Take 1/2 of a tablet 2 times a day      . ondansetron (ZOFRAN) 4 MG tablet Take 4 mg by mouth every 8 (eight) hours as needed.       Marland Kitchen  polyethylene glycol powder (MIRALAX) powder Take 17 g by mouth daily.      Marland Kitchen SPIRIVA HANDIHALER 18 MCG inhalation capsule USE 1 CAPSULE DAILY AS DIRECTED  30 each  4  . traMADol (ULTRAM) 50 MG tablet Take 50 mg by mouth every 6 (six) hours as needed. For pain       Review of Systems Review of Systems  Constitutional: Negative for diaphoresis and unexpected weight change.  HENT: Negative for drooling and tinnitus.   Eyes: Negative for photophobia and visual disturbance.  Respiratory: Negative for choking and stridor.   Gastrointestinal: Negative for vomiting and blood in stool.  Genitourinary: Negative for hematuria and decreased urine volume.  Neurological: Negative for tremors and  numbness.  Psychiatric/Behavioral: Negative for decreased concentration. The patient is not hyperactive.      Objective:   Physical Exam BP 118/70  Pulse 72  Temp 97.6 F (36.4 C) (Oral)  Ht 5\' 6"  (1.676 m)  Wt 128 lb 4 oz (58.174 kg)  BMI 20.70 kg/m2  SpO2 95% Physical Exam  VS noted Constitutional: Pt appears well-developed and well-nourished.  HENT: Head: Normocephalic.  Right Ear: External ear normal.  Left Ear: External ear normal.  Eyes: Conjunctivae and EOM are normal. Pupils are equal, round, and reactive to light.  Mouth:  Pharynx benign, tongue no mass, no other swelling/red/ulcer Neck: Normal range of motion. Neck supple. right neck with nondiscrete tender swelling area approx 2 cm about 1 cm below mid right mandible, mild tender, slightly firm, no fluctuance or rebound Cardiovascular: Normal rate and regular rhythm.   Pulmonary/Chest: Effort normal and breath sounds normal.  Neurological: Pt is alert. Not confused.  Skin: Skin is warm. No erythema.  Psychiatric: Pt behavior is normal. Thought content normal.     Assessment & Plan:

## 2012-01-06 NOTE — Assessment & Plan Note (Signed)
Now on home o2, stable overall by hx and exam, most recent data reviewed with pt, and pt to continue medical treatment as before SpO2 Readings from Last 3 Encounters:  01/06/12 95%  12/31/11 98%  12/14/11 81%

## 2012-01-06 NOTE — Patient Instructions (Addendum)
Take all new medications as prescribed Continue all other medications as before  

## 2012-01-06 NOTE — Assessment & Plan Note (Signed)
Most likely salivary gland infection/inflammation; Mild to mod, for antibx course,  to f/u any worsening symptoms or concerns; to ENT if not improved 1-2 wks

## 2012-01-11 ENCOUNTER — Telehealth: Payer: Self-pay | Admitting: Internal Medicine

## 2012-01-11 NOTE — Telephone Encounter (Signed)
Check sats on o2 walking was added to appt notes.

## 2012-01-11 NOTE — Telephone Encounter (Signed)
I spoke to pt:  Be sure we check her 02 with walking on her 02 when she comes to office but not change rx for now

## 2012-01-11 NOTE — Telephone Encounter (Signed)
Called spoke with patient who c/o increased SOB, wheezing, tightness and some dry cough x1 month.  The SOB has been "really bad" to the point where pt feels like she "can't do anything" except sit and rest.  Reports the oxygen only helps with sitting.  Has been taking spiriva as directed.  Pt declined ov today d/t cost and husband's work schedule.  Advised pt she does not have to pay her copay upfront if she is unable and can request a bill be mailed to her.  Ov with TP scheduled for 8.23.13 @ 3pm.  Advised pt to seek emergency assistance if her breathing worsens prior to ov.  Dr Sherene Sires, do you have any recs for patient prior to appt?  Thanks.

## 2012-01-14 ENCOUNTER — Ambulatory Visit (INDEPENDENT_AMBULATORY_CARE_PROVIDER_SITE_OTHER): Payer: Medicare Other | Admitting: Adult Health

## 2012-01-14 ENCOUNTER — Encounter: Payer: Self-pay | Admitting: Adult Health

## 2012-01-14 ENCOUNTER — Other Ambulatory Visit (INDEPENDENT_AMBULATORY_CARE_PROVIDER_SITE_OTHER): Payer: Medicare Other

## 2012-01-14 ENCOUNTER — Other Ambulatory Visit: Payer: Self-pay

## 2012-01-14 ENCOUNTER — Encounter: Payer: Self-pay | Admitting: Internal Medicine

## 2012-01-14 VITALS — BP 120/68 | HR 85 | Temp 98.2°F | Ht 66.0 in | Wt 128.6 lb

## 2012-01-14 DIAGNOSIS — Z79899 Other long term (current) drug therapy: Secondary | ICD-10-CM

## 2012-01-14 DIAGNOSIS — R7302 Impaired glucose tolerance (oral): Secondary | ICD-10-CM

## 2012-01-14 DIAGNOSIS — J449 Chronic obstructive pulmonary disease, unspecified: Secondary | ICD-10-CM

## 2012-01-14 DIAGNOSIS — R7309 Other abnormal glucose: Secondary | ICD-10-CM

## 2012-01-14 DIAGNOSIS — E785 Hyperlipidemia, unspecified: Secondary | ICD-10-CM

## 2012-01-14 DIAGNOSIS — Z0000 Encounter for general adult medical examination without abnormal findings: Secondary | ICD-10-CM

## 2012-01-14 LAB — TSH: TSH: 0.79 u[IU]/mL (ref 0.35–5.50)

## 2012-01-14 LAB — CBC WITH DIFFERENTIAL/PLATELET
Basophils Absolute: 0.1 10*3/uL (ref 0.0–0.1)
Eosinophils Relative: 7 % — ABNORMAL HIGH (ref 0.0–5.0)
HCT: 38.5 % (ref 36.0–46.0)
Hemoglobin: 12.6 g/dL (ref 12.0–15.0)
Lymphocytes Relative: 26.3 % (ref 12.0–46.0)
Lymphs Abs: 2.2 10*3/uL (ref 0.7–4.0)
Monocytes Relative: 7.2 % (ref 3.0–12.0)
Neutro Abs: 5 10*3/uL (ref 1.4–7.7)
WBC: 8.6 10*3/uL (ref 4.5–10.5)

## 2012-01-14 LAB — BASIC METABOLIC PANEL
CO2: 33 mEq/L — ABNORMAL HIGH (ref 19–32)
Calcium: 8.9 mg/dL (ref 8.4–10.5)
Creatinine, Ser: 0.9 mg/dL (ref 0.4–1.2)
GFR: 65.7 mL/min (ref >60.00)
Glucose, Bld: 88 mg/dL (ref 70–99)
Sodium: 140 mEq/L (ref 135–145)

## 2012-01-14 LAB — LDL CHOLESTEROL, DIRECT: Direct LDL: 54.8 mg/dL

## 2012-01-14 LAB — LIPID PANEL
Cholesterol: 158 mg/dL (ref 0–200)
Triglycerides: 292 mg/dL — ABNORMAL HIGH (ref 0.0–149.0)

## 2012-01-14 LAB — URINALYSIS, ROUTINE W REFLEX MICROSCOPIC
Bilirubin Urine: NEGATIVE
Ketones, ur: NEGATIVE
Specific Gravity, Urine: 1.01 (ref 1.000–1.030)
Total Protein, Urine: NEGATIVE
Urine Glucose: NEGATIVE
pH: 6 (ref 5.0–8.0)

## 2012-01-14 LAB — HEPATIC FUNCTION PANEL
ALT: 9 U/L (ref 0–35)
AST: 19 U/L (ref 0–37)
Albumin: 3.4 g/dL — ABNORMAL LOW (ref 3.5–5.2)
Alkaline Phosphatase: 77 U/L (ref 39–117)
Total Bilirubin: 0.4 mg/dL (ref 0.3–1.2)

## 2012-01-14 NOTE — Progress Notes (Unsigned)
Patient ID: Alexis Higgins, female   DOB: 06-28-1940, 71 y.o.   MRN: [id number] Colon Cancer Screening Reward Form filled out and mailed for pt., copy sent to scan.

## 2012-01-14 NOTE — Patient Instructions (Addendum)
Use continuous flow O2 at 2L/m with walking  Continue on current regimen.  follow up Dr. Sherene Sires  As planned and As needed

## 2012-01-17 NOTE — Assessment & Plan Note (Signed)
Discussed in detail COPD disease-  Reviewed her PFT results.  Reviewed her O2 and demand vs continuous flow   Plan  Use continuous flow O2 at 2L/m with walking  Continue on current regimen.  follow up Dr. Sherene Sires  As planned and As needed

## 2012-01-17 NOTE — Progress Notes (Signed)
  Subjective:    Patient ID: Alexis Higgins, female    DOB: [birth year]  MRN: [id number]  HPI  30 yowf quit smoking in 2007 with dx of bronchitis 100% better and did not require much x for occ rescue meds then then placed on spirva for sob around 2010 and much worse sob spring of 2013 so referred to pulmonary clinic 12/31/2011 by Dr Jonny Ruiz.   12/31/2011 1st pulmonary eval cc doe x across the yard even on 02.   No unusual cough, purulent sputum or sinus/hb symptoms on present rx.  No variability.   Added advair already, no better saba better but can't tol shaking so got xopenex but hasn't used it Takes spiriva each pm >>stopped  lisinopril and advair, rx spiriva   01/14/12 Follow up  Returns for follow up .  Last visit ACE was stopped. Advair changed to Spiriva .  Feels some better . Dyspnea is some better.  Still wears out with walking. Uses demand setting with walking -drops in low 90s on demand setting On continuous flow feels like gets more air.   No wheezing, chest tightness, or cough.    No fever or edema.    Review of Systems  Constitutional:   No  weight loss, night sweats,  Fevers, chills, +fatigue, or  lassitude.  HEENT:   No headaches,  Difficulty swallowing,  Tooth/dental problems, or  Sore throat,                No sneezing, itching, ear ache,  +nasal congestion, post nasal drip,   CV:  No chest pain,  Orthopnea, PND, swelling in lower extremities, anasarca, dizziness, palpitations, syncope.   GI  No heartburn, indigestion, abdominal pain, nausea, vomiting, diarrhea, change in bowel habits, loss of appetite, bloody stools.   Resp:    No coughing up of blood.    No chest wall deformity  Skin: no rash or lesions.  GU: no dysuria, change in color of urine, no urgency or frequency.  No flank pain, no hematuria   MS:  No joint pain or swelling.  No decreased range of motion.  No back pain.  Psych:  No change in mood or affect. No depression or anxiety.  No memory  loss.         Objective:   Physical Exam  Elderly wf nad on 02 HEENT mild turbinate edema.  Oropharynx no thrush or excess pnd or cobblestoning.  No JVD or cervical adenopathy. Mild accessory muscle hypertrophy. Trachea midline, nl thryroid. Chest was hyperinflated by percussion with diminished breath sounds  Regular rate and rhythm without murmur gallop or rub or increase P2 or edema.  Abd: no hsm, nl excursion. Ext warm without cyanosis or clubbing.    11/22/11 CXR  Changes of COPD with minimal chronic blunting of the posterior left  costophrenic angle question related to pleural fluid or thickening.  No acute abnormalities.     Assessment & Plan:

## 2012-01-21 ENCOUNTER — Ambulatory Visit (INDEPENDENT_AMBULATORY_CARE_PROVIDER_SITE_OTHER): Payer: Medicare Other | Admitting: Internal Medicine

## 2012-01-21 ENCOUNTER — Encounter: Payer: Self-pay | Admitting: Internal Medicine

## 2012-01-21 VITALS — BP 100/62 | HR 68 | Temp 97.7°F | Ht 66.0 in | Wt 126.8 lb

## 2012-01-21 DIAGNOSIS — R7302 Impaired glucose tolerance (oral): Secondary | ICD-10-CM

## 2012-01-21 DIAGNOSIS — I10 Essential (primary) hypertension: Secondary | ICD-10-CM

## 2012-01-21 DIAGNOSIS — Z0000 Encounter for general adult medical examination without abnormal findings: Secondary | ICD-10-CM

## 2012-01-21 DIAGNOSIS — R7309 Other abnormal glucose: Secondary | ICD-10-CM

## 2012-01-21 MED ORDER — LOVASTATIN 20 MG PO TABS: 20.0000 mg | ORAL_TABLET | Freq: Every day | ORAL | Status: DC

## 2012-01-21 NOTE — Patient Instructions (Addendum)
Continue all other medications as before Please have the pharmacy call with any refills you may need. Your form will be filled out and sent today Please keep your appointments with your specialists as you have planned - Dr Lilia Argue are up to date with prevention measures Please return in 6 mo with Lab testing done 3-5 days before

## 2012-01-22 ENCOUNTER — Encounter: Payer: Self-pay | Admitting: Internal Medicine

## 2012-01-22 NOTE — Progress Notes (Signed)
Subjective:    Patient ID: Alexis Higgins, female    DOB: 30-Oct-1940, 71 y.o.   MRN: [id number]  HPI  Here for wellness and f/u;  Overall doing ok;  Pt denies CP, worsening SOB, DOE, wheezing, orthopnea, PND, worsening LE edema, palpitations, dizziness or syncope.  Pt denies neurological change such as new Headache, facial or extremity weakness.  Pt denies polydipsia, polyuria, or low sugar symptoms. Pt states overall good compliance with treatment and medications, good tolerability, and trying to follow lower cholesterol diet.  Pt denies worsening depressive symptoms, suicidal ideation or panic. No fever, wt loss, night sweats, loss of appetite, or other constitutional symptoms.  Pt states good ability with ADL's, low fall risk, home safety reviewed and adequate, no significant changes in hearing or vision, and occasionally active with exercise.  Husband remains supportive, no acute complaints Past Medical History  Diagnosis Date  . Impaired glucose tolerance 01/07/2011  . ANXIETY 01/01/2007  . BURSITIS, RIGHT HIP 06/04/2009  . CAD, NATIVE VESSEL 01/15/2009  . CHEST PAIN-PRECORDIAL 01/15/2009  . COPD 01/01/2007  . DEPRESSION 01/01/2007  . GROIN PAIN 06/20/2008  . Headache 01/01/2007  . HYPERTENSION 01/01/2007  . LOW BACK PAIN 01/01/2007  . Muscle weakness (generalized) 06/04/2009  . OSTEOARTHRITIS, HIP 07/01/2008  . OSTEOPOROSIS 01/01/2007  . Other symptoms involving cardiovascular system 01/15/2009  . Preventative health care 01/07/2011  . SYNCOPE 01/01/2007  . TRANSIENT ISCHEMIC ATTACK, HX OF 01/01/2007  . Eczema 01/08/2011  . Rosacea 01/08/2011  . Colon polyps   . Hemorrhoid   . Tubular adenoma of colon   . Pneumonia 1998  . Cataract    Past Surgical History  Procedure Date  . Appendectomy   . Abdominal hysterectomy   . Oophorectomy     one ovary  . Sp lumbar disc surgury     Dr. Leslee Home  . S/p bilat cataract 2010  . Coronary stent placement   . Colonoscopy 01/25/2002    tubular  adenoma,hemorrhoids, hyperplastic  colon polyps  . Colonoscopy 02/17/2005    hemorrhoids    reports that she quit smoking about 6 years ago. Her smoking use included Cigarettes. She has a 50 pack-year smoking history. She has never used smokeless tobacco. She reports that she does not drink alcohol or use illicit drugs. family history includes Cardiomyopathy in her sister; Colon cancer in an unspecified family member; Heart attack in her sister; Heart disease in her sister; Osteoporosis in her mother; and Seizures in her sister. Allergies  Allergen Reactions  . Aspirin Other (See Comments)    REACTION: cns bleed risk  . Codeine Rash   Current Outpatient Prescriptions on File Prior to Visit  Medication Sig Dispense Refill  . amitriptyline (ELAVIL) 10 MG tablet Take 20 mg by mouth at bedtime. 2 tabs po qhs      . aspirin 81 MG tablet Take 81 mg by mouth daily.        . Cholecalciferol (VITAMIN D) 1000 UNITS capsule Take 1,000 Units by mouth daily.        Marland Kitchen docusate sodium (COLACE) 100 MG capsule Take 100 mg by mouth daily.      Marland Kitchen levalbuterol (XOPENEX HFA) 45 MCG/ACT inhaler Inhale 1-2 puffs into the lungs every 4 (four) hours as needed for wheezing.  1 Inhaler  12  . metoprolol tartrate (LOPRESSOR) 25 MG tablet Take 0.5 mg by mouth Twice daily.      . ondansetron (ZOFRAN) 4 MG tablet Take 4 mg by mouth every  8 (eight) hours as needed.       . polyethylene glycol powder (MIRALAX) powder Take 17 g by mouth daily.      Marland Kitchen SPIRIVA HANDIHALER 18 MCG inhalation capsule USE 1 CAPSULE DAILY AS DIRECTED  30 each  4  . traMADol (ULTRAM) 50 MG tablet Take 50 mg by mouth every 6 (six) hours as needed. For pain       Review of Systems Review of Systems  Constitutional: Negative for diaphoresis, activity change, appetite change and unexpected weight change.  HENT: Negative for hearing loss, ear pain, facial swelling, mouth sores and neck stiffness.   Eyes: Negative for pain, redness and visual  disturbance.  Respiratory: Negative for worsening cough   Cardiovascular: Negative for chest pain and palpitations.  Gastrointestinal: Negative for diarrhea, blood in stool, abdominal distention and rectal pain.  Genitourinary: Negative for hematuria, flank pain and decreased urine volume.  Musculoskeletal: Negative for myalgias and joint swelling.  Skin: Negative for color change and wound.  Neurological: Negative for syncope and numbness.  Hematological: Negative for adenopathy.  Psychiatric/Behavioral: Negative for hallucinations, self-injury, decreased concentration and agitation.     Objective:   Physical Exam BP 100/62  Pulse 68  Temp 97.7 F (36.5 C) (Oral)  Ht 5\' 6"  (1.676 m)  Wt 126 lb 12 oz (57.493 kg)  BMI 20.46 kg/m2  SpO2 97% Physical Exam  VS noted, not ill appaering Constitutional: Pt is oriented to person, place, and time. Appears well-developed and well-nourished.  HENT:  Head: Normocephalic and atraumatic.  Right Ear: External ear normal.  Left Ear: External ear normal.  Nose: Nose normal.  Mouth/Throat: Oropharynx is clear and moist.  Eyes: Conjunctivae and EOM are normal. Pupils are equal, round, and reactive to light.  Neck: Normal range of motion. Neck supple. No JVD present. No tracheal deviation present.  Cardiovascular: Normal rate, regular rhythm, normal heart sounds and intact distal pulses.   Pulmonary/Chest: Effort normal and breath sounds decreased with few dry rales bibasilar Abdominal: Soft. Bowel sounds are normal. There is no tenderness.  Musculoskeletal: Normal range of motion. Exhibits no edema.  Lymphadenopathy:  Has no cervical adenopathy.  Neurological: Pt is alert and oriented to person, place, and time. Pt has normal reflexes. No cranial nerve deficit. Motor/dtr/gait intact  Skin: Skin is warm and dry. No rash noted.  Psychiatric:  Mild nervous, Behavior is normal.     Assessment & Plan:

## 2012-01-22 NOTE — Assessment & Plan Note (Signed)

## 2012-01-22 NOTE — Assessment & Plan Note (Signed)
stable overall by hx and exam, most recent data reviewed with pt, and pt to continue medical treatment as before Lab Results  Component Value Date   HGBA1C 5.4 01/14/2012

## 2012-01-22 NOTE — Assessment & Plan Note (Signed)
stable overall by hx and exam, most recent data reviewed with pt, and pt to continue medical treatment as before BP Readings from Last 3 Encounters:  01/21/12 100/62  01/14/12 120/68  01/06/12 118/70

## 2012-02-08 ENCOUNTER — Ambulatory Visit: Payer: Medicare Other | Admitting: Internal Medicine

## 2012-02-11 ENCOUNTER — Ambulatory Visit (INDEPENDENT_AMBULATORY_CARE_PROVIDER_SITE_OTHER): Payer: Medicare Other | Admitting: Internal Medicine

## 2012-02-11 ENCOUNTER — Other Ambulatory Visit: Payer: Self-pay | Admitting: Internal Medicine

## 2012-02-11 ENCOUNTER — Encounter: Payer: Self-pay | Admitting: Internal Medicine

## 2012-02-11 VITALS — BP 130/74 | HR 84 | Temp 98.3°F | Ht 66.0 in | Wt 128.0 lb

## 2012-02-11 DIAGNOSIS — J449 Chronic obstructive pulmonary disease, unspecified: Secondary | ICD-10-CM

## 2012-02-11 DIAGNOSIS — Z23 Encounter for immunization: Secondary | ICD-10-CM

## 2012-02-11 DIAGNOSIS — J9610 Chronic respiratory failure, unspecified whether with hypoxia or hypercapnia: Secondary | ICD-10-CM

## 2012-02-11 NOTE — Patient Instructions (Addendum)
Please see patient coordinator before you leave today  to schedule referral to pulmonary rehab  Please schedule a follow up visit in 6 months but call sooner if needed

## 2012-02-11 NOTE — Progress Notes (Signed)
  Subjective:    Patient ID: Alexis Higgins, female    DOB: 1940-12-09  MRN: [id number]  HPI  46 yowf quit smoking in 2007 with dx of bronchitis 100% better and did not require much x for occ rescue meds then then placed on spirva for sob around 2010 and much worse sob spring of 2013 so referred to pulmonary clinic 12/31/2011 by Dr Jonny Ruiz.   12/31/2011 1st pulmonary eval cc doe x across the yard even on 02.   No unusual cough, purulent sputum or sinus/hb symptoms on present rx.  No variability.   Added advair already, no better saba better but can't tol shaking so got xopenex but hasn't used it Takes spiriva each pm >>stopped  lisinopril and advair, rx spiriva   01/14/12 Follow up  Returns for follow up .  Last visit ACE was stopped. Advair changed to Spiriva .  Feels some better . Dyspnea is some better.  Still wears out with walking. Uses demand setting with walking -drops in low 90s on demand setting On continuous flow feels like gets more air.   No wheezing, chest tightness, or cough.    No fever or edema.  rec Use continuous flow O2 at 2L/m with walking  Continue on current regimen.   02/11/2012 f/u ov/Wert cc much better attributed to using 02 , able to do half a [employer] on 02 continuous flow, using spriva rare xopenex cause makes her shake. No obvious daytime variabilty or assoc chronic cough or cp or chest tightness, subjective wheeze overt sinus or hb symptoms. No unusual exp hx  Sleeping ok without nocturnal  or early am exacerbation  of respiratory  c/o's or need for noct saba. Also denies any obvious fluctuation of symptoms with weather or environmental changes or other aggravating or alleviating factors except as outlined above             Objective:   Physical Exam  Elderly wf nad on 02 HEENT mild turbinate edema.  Edentulous with dentures in place. Oropharynx no thrush or excess pnd or cobblestoning.  No JVD or cervical adenopathy. Mild accessory muscle hypertrophy.  Trachea midline, nl thryroid. Chest was hyperinflated by percussion with diminished breath sounds  Regular rate and rhythm without murmur gallop or rub or increase P2 or edema.  Abd: no hsm, nl excursion. Ext warm without cyanosis or clubbing.    11/22/11 CXR  Changes of COPD with minimal chronic blunting of the posterior left  costophrenic angle question related to pleural fluid or thickening.  No acute abnormalities.     Assessment & Plan:

## 2012-02-13 DIAGNOSIS — J9610 Chronic respiratory failure, unspecified whether with hypoxia or hypercapnia: Secondary | ICD-10-CM | POA: Insufficient documentation

## 2012-02-13 NOTE — Assessment & Plan Note (Signed)
-   PFTs 12/02/11  FEV1  0.93(42%) ratio 42 and DLC039%   - try off acei 01/01/2012   GOLD III s much variability/02 dependent > good candidate for rehab

## 2012-02-13 NOTE — Assessment & Plan Note (Signed)
Adequate control on present rx, reviewed  

## 2012-04-12 ENCOUNTER — Emergency Department (HOSPITAL_COMMUNITY): Payer: Medicare Other

## 2012-04-12 ENCOUNTER — Encounter (HOSPITAL_COMMUNITY): Payer: Self-pay | Admitting: Emergency Medicine

## 2012-04-12 ENCOUNTER — Observation Stay (HOSPITAL_COMMUNITY)
Admission: EM | Admit: 2012-04-12 | Discharge: 2012-04-13 | Disposition: A | Discharge status: Home / Self Care | Payer: Medicare Other | Attending: Cardiovascular Disease | Admitting: Cardiovascular Disease

## 2012-04-12 DIAGNOSIS — R0601 Orthopnea: Secondary | ICD-10-CM | POA: Insufficient documentation

## 2012-04-12 DIAGNOSIS — Z9981 Dependence on supplemental oxygen: Secondary | ICD-10-CM | POA: Insufficient documentation

## 2012-04-12 DIAGNOSIS — I10 Essential (primary) hypertension: Secondary | ICD-10-CM | POA: Insufficient documentation

## 2012-04-12 DIAGNOSIS — L259 Unspecified contact dermatitis, unspecified cause: Secondary | ICD-10-CM | POA: Insufficient documentation

## 2012-04-12 DIAGNOSIS — R7309 Other abnormal glucose: Secondary | ICD-10-CM | POA: Insufficient documentation

## 2012-04-12 DIAGNOSIS — M1990 Unspecified osteoarthritis, unspecified site: Secondary | ICD-10-CM | POA: Insufficient documentation

## 2012-04-12 DIAGNOSIS — J449 Chronic obstructive pulmonary disease, unspecified: Secondary | ICD-10-CM | POA: Insufficient documentation

## 2012-04-12 DIAGNOSIS — Z79899 Other long term (current) drug therapy: Secondary | ICD-10-CM | POA: Insufficient documentation

## 2012-04-12 DIAGNOSIS — I200 Unstable angina: Secondary | ICD-10-CM

## 2012-04-12 DIAGNOSIS — R918 Other nonspecific abnormal finding of lung field: Secondary | ICD-10-CM | POA: Insufficient documentation

## 2012-04-12 DIAGNOSIS — I2510 Atherosclerotic heart disease of native coronary artery without angina pectoris: Principal | ICD-10-CM | POA: Insufficient documentation

## 2012-04-12 DIAGNOSIS — E785 Hyperlipidemia, unspecified: Secondary | ICD-10-CM | POA: Insufficient documentation

## 2012-04-12 DIAGNOSIS — Z7982 Long term (current) use of aspirin: Secondary | ICD-10-CM | POA: Insufficient documentation

## 2012-04-12 DIAGNOSIS — F411 Generalized anxiety disorder: Secondary | ICD-10-CM | POA: Insufficient documentation

## 2012-04-12 DIAGNOSIS — J4489 Other specified chronic obstructive pulmonary disease: Secondary | ICD-10-CM | POA: Insufficient documentation

## 2012-04-12 DIAGNOSIS — F329 Major depressive disorder, single episode, unspecified: Secondary | ICD-10-CM | POA: Insufficient documentation

## 2012-04-12 DIAGNOSIS — M810 Age-related osteoporosis without current pathological fracture: Secondary | ICD-10-CM | POA: Insufficient documentation

## 2012-04-12 DIAGNOSIS — Z8673 Personal history of transient ischemic attack (TIA), and cerebral infarction without residual deficits: Secondary | ICD-10-CM | POA: Insufficient documentation

## 2012-04-12 DIAGNOSIS — F3289 Other specified depressive episodes: Secondary | ICD-10-CM | POA: Insufficient documentation

## 2012-04-12 DIAGNOSIS — L719 Rosacea, unspecified: Secondary | ICD-10-CM | POA: Insufficient documentation

## 2012-04-12 HISTORY — DX: Atherosclerotic heart disease of native coronary artery without angina pectoris: I25.10

## 2012-04-12 LAB — CBC WITH DIFFERENTIAL/PLATELET
Basophils Absolute: 0 10*3/uL (ref 0.0–0.1)
Basophils Relative: 1 % (ref 0–1)
Eosinophils Absolute: 0.7 10*3/uL (ref 0.0–0.7)
HCT: 36.9 % (ref 36.0–46.0)
Hemoglobin: 12.2 g/dL (ref 12.0–15.0)
MCH: 27.7 pg (ref 26.0–34.0)
MCHC: 33.1 g/dL (ref 30.0–36.0)
Monocytes Absolute: 0.6 10*3/uL (ref 0.1–1.0)
Monocytes Relative: 8 % (ref 3–12)
Neutro Abs: 3.5 10*3/uL (ref 1.7–7.7)
RDW: 13.1 % (ref 11.5–15.5)

## 2012-04-12 LAB — BASIC METABOLIC PANEL
BUN: 5 mg/dL — ABNORMAL LOW (ref 6–23)
Calcium: 8.9 mg/dL (ref 8.4–10.5)
Creatinine, Ser: 0.82 mg/dL (ref 0.50–1.10)
GFR calc Af Amer: 82 mL/min — ABNORMAL LOW (ref >90)
GFR calc non Af Amer: 71 mL/min — ABNORMAL LOW (ref >90)

## 2012-04-12 LAB — PROTIME-INR: Prothrombin Time: 12.1 seconds (ref 11.6–15.2)

## 2012-04-12 LAB — TROPONIN I: Troponin I: <0.3 ng/mL (ref <0.30)

## 2012-04-12 MED ORDER — TRAMADOL HCL 50 MG PO TABS
100.0000 mg | ORAL_TABLET | Freq: Three times a day (TID) | ORAL | Status: DC | PRN
  Administered 2012-04-12 – 2012-04-13 (×2): 100 mg via ORAL
  Filled 2012-04-12 (×2): qty 2

## 2012-04-12 MED ORDER — SODIUM CHLORIDE 0.9 % IV SOLN: 250.0000 mL | INTRAVENOUS | Status: DC | PRN

## 2012-04-12 MED ORDER — SODIUM CHLORIDE 0.9 % IJ SOLN: 3.0000 mL | Freq: Two times a day (BID) | INTRAMUSCULAR | Status: DC

## 2012-04-12 MED ORDER — ASPIRIN 300 MG RE SUPP
300.0000 mg | RECTAL | Status: AC
  Filled 2012-04-12: qty 1

## 2012-04-12 MED ORDER — AMITRIPTYLINE HCL 10 MG PO TABS
20.0000 mg | ORAL_TABLET | Freq: Every day | ORAL | Status: DC
  Administered 2012-04-12: 20 mg via ORAL
  Filled 2012-04-12 (×2): qty 2

## 2012-04-12 MED ORDER — TIOTROPIUM BROMIDE MONOHYDRATE 18 MCG IN CAPS
18.0000 ug | ORAL_CAPSULE | Freq: Every day | RESPIRATORY_TRACT | Status: DC
  Administered 2012-04-13: 18 ug via RESPIRATORY_TRACT
  Filled 2012-04-12: qty 5

## 2012-04-12 MED ORDER — HEPARIN (PORCINE) IN NACL 100-0.45 UNIT/ML-% IJ SOLN
700.0000 [IU]/h | INTRAMUSCULAR | Status: DC
  Administered 2012-04-12: 700 [IU]/h via INTRAVENOUS
  Filled 2012-04-12 (×2): qty 250

## 2012-04-12 MED ORDER — ONDANSETRON HCL 4 MG/2ML IJ SOLN: 4.0000 mg | Freq: Four times a day (QID) | INTRAMUSCULAR | Status: DC | PRN

## 2012-04-12 MED ORDER — SODIUM CHLORIDE 0.9 % IJ SOLN: 3.0000 mL | INTRAMUSCULAR | Status: DC | PRN

## 2012-04-12 MED ORDER — ASPIRIN EC 81 MG PO TBEC
81.0000 mg | DELAYED_RELEASE_TABLET | Freq: Every day | ORAL | Status: DC
  Administered 2012-04-12: 81 mg via ORAL
  Filled 2012-04-12 (×2): qty 1

## 2012-04-12 MED ORDER — NITROGLYCERIN IN D5W 200-5 MCG/ML-% IV SOLN: 3.0000 ug/min | INTRAVENOUS | Status: DC

## 2012-04-12 MED ORDER — DIAZEPAM 5 MG PO TABS
5.0000 mg | ORAL_TABLET | ORAL | Status: AC
  Administered 2012-04-13: 5 mg via ORAL
  Filled 2012-04-12: qty 1

## 2012-04-12 MED ORDER — ASPIRIN 81 MG PO CHEW: 324.0000 mg | CHEWABLE_TABLET | ORAL | Status: DC

## 2012-04-12 MED ORDER — SODIUM CHLORIDE 0.9 % IJ SOLN
3.0000 mL | Freq: Two times a day (BID) | INTRAMUSCULAR | Status: DC
  Administered 2012-04-12: 3 mL via INTRAVENOUS

## 2012-04-12 MED ORDER — SIMVASTATIN 20 MG PO TABS
20.0000 mg | ORAL_TABLET | Freq: Every day | ORAL | Status: DC
  Filled 2012-04-12: qty 1

## 2012-04-12 MED ORDER — DOCUSATE SODIUM 100 MG PO CAPS
100.0000 mg | ORAL_CAPSULE | Freq: Every day | ORAL | Status: DC | PRN
Start: 2012-04-12 — End: 2012-04-13
  Filled 2012-04-12: qty 1

## 2012-04-12 MED ORDER — ASPIRIN 81 MG PO CHEW
324.0000 mg | CHEWABLE_TABLET | ORAL | Status: AC
  Administered 2012-04-12: 324 mg via ORAL
  Filled 2012-04-12: qty 4

## 2012-04-12 MED ORDER — METOPROLOL TARTRATE 12.5 MG HALF TABLET
12.5000 mg | ORAL_TABLET | Freq: Two times a day (BID) | ORAL | Status: DC
  Administered 2012-04-12 – 2012-04-13 (×2): 12.5 mg via ORAL
  Filled 2012-04-12 (×3): qty 1

## 2012-04-12 MED ORDER — POLYETHYLENE GLYCOL 3350 17 GM/SCOOP PO POWD
17.0000 g | Freq: Every day | ORAL | Status: DC | PRN
  Filled 2012-04-12: qty 255

## 2012-04-12 MED ORDER — HEPARIN BOLUS VIA INFUSION
3000.0000 [IU] | Freq: Once | INTRAVENOUS | Status: AC
  Administered 2012-04-12: 3000 [IU] via INTRAVENOUS
  Filled 2012-04-12: qty 3000

## 2012-04-12 MED ORDER — ONDANSETRON HCL 4 MG PO TABS
4.0000 mg | ORAL_TABLET | Freq: Three times a day (TID) | ORAL | Status: DC | PRN
  Administered 2012-04-12 – 2012-04-13 (×2): 4 mg via ORAL
  Filled 2012-04-12 (×2): qty 1

## 2012-04-12 MED ORDER — NITROGLYCERIN 0.4 MG SL SUBL: 0.4000 mg | SUBLINGUAL_TABLET | SUBLINGUAL | Status: DC | PRN

## 2012-04-12 MED ORDER — SODIUM CHLORIDE 0.9 % IV SOLN
INTRAVENOUS | Status: DC
  Administered 2012-04-13: 05:00:00 via INTRAVENOUS

## 2012-04-12 MED ORDER — ACETAMINOPHEN 325 MG PO TABS: 650.0000 mg | ORAL_TABLET | ORAL | Status: DC | PRN

## 2012-04-12 NOTE — H&P (Signed)
Patient ID: Alexis Higgins MRN: [id number] DOB/AGE: Oct 09, 1940 71 y.o. Admit date: 04/12/2012  Primary Care Physician:James Jonny Ruiz, MD Primary Cardiologist: Earney Hamburg, MD Active Problems:  Unstable angina pectoris  HPI: 71 year old woman presenting with several hours of left shoulder pain. She describes this as a pressure-like sensation. Her symptoms started this morning after breakfast and they have persisted all day. The pain is not affected by movement of the shoulder or arm. The patient has chronic shortness of breath and has oxygen-dependent COPD, but she denies any change in her chronic dyspnea. She denies orthopnea, PND, or leg swelling. She has not taken nitroglycerin.  Her cardiac history dates back to 2010 when she presented with unstable angina. She was found to have high-grade proximal LAD stenosis and was treated with a bare-metal stent. At that time she presented with bilateral shoulder pain it felt exactly like her symptoms today. She did well until one year later when she again developed shoulder pain. She underwent cardiac catheterization and was found to have severe in-stent restenosis and was treated with a drug-eluting stent within the previously placed bare-metal stent. She has had no recurrent anginal symptoms until developing shoulder pain again today. She did house work on Monday and had no symptoms at that time. Her initial cardiac markers and EKGs are unremarkable.  Past Medical History  Diagnosis Date  . Impaired glucose tolerance 01/07/2011  . ANXIETY 01/01/2007  . BURSITIS, RIGHT HIP 06/04/2009  . CAD, NATIVE VESSEL 01/15/2009  . CHEST PAIN-PRECORDIAL 01/15/2009  . COPD 01/01/2007  . DEPRESSION 01/01/2007  . GROIN PAIN 06/20/2008  . Headache 01/01/2007  . HYPERTENSION 01/01/2007  . LOW BACK PAIN 01/01/2007  . Muscle weakness (generalized) 06/04/2009  . OSTEOARTHRITIS, HIP 07/01/2008  . OSTEOPOROSIS 01/01/2007  . Other symptoms involving cardiovascular system  01/15/2009  . Preventative health care 01/07/2011  . SYNCOPE 01/01/2007  . TRANSIENT ISCHEMIC ATTACK, HX OF 01/01/2007  . Eczema 01/08/2011  . Rosacea 01/08/2011  . Colon polyps   . Hemorrhoid   . Tubular adenoma of colon   . Pneumonia 1998  . Cataract     Past Surgical History  Procedure Date  . Appendectomy   . Abdominal hysterectomy   . Oophorectomy     one ovary  . Sp lumbar disc surgury     Dr. Leslee Home  . S/p bilat cataract 2010  . Coronary stent placement   . Colonoscopy 01/25/2002    tubular adenoma,hemorrhoids, hyperplastic  colon polyps  . Colonoscopy 02/17/2005    hemorrhoids    Family History  Problem Relation Age of Onset  . Osteoporosis Mother   . Heart disease Sister   . Colon cancer    . Seizures Sister     epilepsy  . Cardiomyopathy Sister   . Heart attack Sister     History   Social History  . Marital Status: Married    Spouse Name: N/A    Number of Children: 4  . Years of Education: N/A   Occupational History  . disabled back since 2003    Social History Main Topics  . Smoking status: Former Smoker -- 1.0 packs/day for 50 years    Types: Cigarettes    Quit date: 07/24/2005  . Smokeless tobacco: Never Used  . Alcohol Use: No  . Drug Use: No  . Sexually Active: Not on file   Other Topics Concern  . Not on file   Social History Narrative  . No narrative on file  General: no fevers/chills/night sweats/weight loss Eyes: no blurry vision, diplopia, or amaurosis ENT: no sore throat or hearing loss Resp: no wheezing, or hemoptysis. Positive for cough. CV: no edema or palpitations GI: no abdominal pain, nausea, vomiting, diarrhea, or constipation GU: no dysuria, frequency, or hematuria Skin: no rash Neuro: no headache, numbness, tingling, or weakness of extremities Musculoskeletal: no joint pain or swelling Heme: no bleeding, DVT, or easy bruising Endo: no polydipsia or polyuria  Physical Exam: Blood pressure 131/73, pulse 62,  temperature 98.1 F (36.7 C), temperature source Oral, resp. rate 17, SpO2 100.00%.    Pt is alert and oriented, WD, WN, in no distress. HEENT: normal Neck: JVP normal. Carotid upstrokes normal without bruits. No thyromegaly. Lungs: equal expansion, poor air movement bilaterally. No rhonchi or rales. CV: Apex is nonpalpable, RRR without murmur or gallop. Heart sounds are distant. Abd: soft, NT, +BS, no bruit, no hepatosplenomegaly Back: no CVA tenderness Ext: no C/C/E        Femoral pulses 2+= without bruits        DP/PT pulses intact and = Skin: warm and dry without rash Neuro: CNII-XII intact             Strength intact = bilaterally  Labs:   Lab Results  Component Value Date   WBC 7.6 04/12/2012   HGB 12.2 04/12/2012   HCT 36.9 04/12/2012   MCV 83.9 04/12/2012   PLT 238 04/12/2012     Lab 04/12/12 1527  NA 139  K 4.3  CL 101  CO2 30  BUN 5*  CREATININE 0.82  CALCIUM 8.9  PROT --  BILITOT --  ALKPHOS --  ALT --  AST --  GLUCOSE 96   Lab Results  Component Value Date   CKTOTAL 56 01/27/2010   CKMB 4.6* 01/27/2010   TROPONINI  Value: 0.41        PERSISTENTLY INCREASED TROPONIN VALUES IN THE RANGE OF 0.06-0.49 ng/mL CAN BE SEEN IN:       -UNSTABLE ANGINA       -CONGESTIVE HEART FAILURE       -MYOCARDITIS       -CHEST TRAUMA       -ARRYHTHMIAS       -LATE PRESENTING MI       -COPD   CLINICAL FOLLOW-UP RECOMMENDED.* 01/27/2010    Lab Results  Component Value Date   CHOL 158 01/14/2012   CHOL 155 02/05/2011   CHOL  Value: 133        ATP III CLASSIFICATION:  <200     mg/dL   Desirable  213-086  mg/dL   Borderline High  >=578    mg/dL   High        08/28/9627   Lab Results  Component Value Date   HDL 56.60 01/14/2012   HDL 52.84 02/05/2011   HDL 64 01/27/2010   Lab Results  Component Value Date   LDLCALC 66 02/05/2011   LDLCALC  Value: 47        Total Cholesterol/HDL:CHD Risk Coronary Heart Disease Risk Table                     Men   Women  1/2 Average Risk   3.4   3.3   Average Risk       5.0   4.4  2 X Average Risk   9.6   7.1  3 X Average Risk  23.4   11.0  Use the calculated Patient Ratio above and the CHD Risk Table to determine the patient's CHD Risk.        ATP III CLASSIFICATION (LDL):  <100     mg/dL   Optimal  578-469  mg/dL   Near or Above                    Optimal  130-159  mg/dL   Borderline  629-528  mg/dL   High  >413     mg/dL   Very High 06/27/4008   LDLCALC 51 05/30/2009   Lab Results  Component Value Date   TRIG 292.0* 01/14/2012   TRIG 146.0 02/05/2011   TRIG 111 01/27/2010   Lab Results  Component Value Date   CHOLHDL 3 01/14/2012   CHOLHDL 3 02/05/2011   CHOLHDL 2.1 01/27/2010   Lab Results  Component Value Date   LDLDIRECT 54.8 01/14/2012   LDLDIRECT 139.7 06/20/2008   LDLDIRECT 130.9 08/17/2007      Radiology: Dg Chest 2 View  04/12/2012  *RADIOLOGY REPORT*  Clinical Data: Left shoulder pain radiating down left arm.  CHEST - 2 VIEW  Comparison: 11/22/2011.  Findings: Trachea is midline. Heart size normal.  Lungs are emphysematous.  Biapical pleural thickening.  Calcified granuloma in the left upper lobe.  Difficult to exclude a new nodular density in the left midlung zone, superimposed with the left seventh posterior rib.  No pleural fluid.  IMPRESSION: Emphysema.  Difficult to exclude a developing nodular density in the left upper lobe.  Non emergent CT chest without contrast could be performed in further evaluation, as clinically indicated. These results will be called to the ordering clinician or representative by the Radiologist Assistant, and communication documented in the PACS Dashboard.   Original Report Authenticated By: Leanna Battles, M.D.    EKG: Normal sinus rhythm 76 beats per minute, age-indeterminate septal infarct, otherwise within normal limits. There is no significant change from previous tracing.  ASSESSMENT AND PLAN:  1. Chest pain possible ACS. The patient does not have EKG changes or positive cardiac markers. She  will be hospitalized and we will rule out MI with serial enzymes. Despite her lack of objective evidence for ischemia, her symptoms are exactly like previous episodes of unstable angina where she was found to have severe proximal LAD stenosis requiring PCI. I have discussed possible diagnostic options including stress testing versus a definitive evaluation with cardiac catheterization. We have decided to proceed with cardiac catheterization tomorrow. I have reviewed the risks, indications, and alternatives of cardiac catheterization and possible PCI with the patient. They understand there is a low risk of stroke, vascular injury, myocardial infarction, or death. The patient agrees to proceed. She will be treated with IV heparin overnight and continued on aspirin. Will start nitroglycerin to see if this improves her shoulder pain.  2. Abnormal chest x-ray. Selective CAT scan with contrast is recommended. Considering the fact that she'll need a contrast procedure tomorrow, will plan on obtaining this as an outpatient. She has a long smoking history, but has been quit for 7 years. This needs to be followed up.  3. COPD, O2 dependent at home. It appears stable. Continue current therapy  4. Hyperlipidemia. Labs from August 2013 showed cholesterol 158, HDL 57, LDL 55, and triglycerides 272. Continue statin drug.  Signed: Tonny Bollman 04/12/2012, 6:02 PM

## 2012-04-12 NOTE — Progress Notes (Signed)
ANTICOAGULATION CONSULT NOTE - Initial Consult  Pharmacy Consult for Heparin Indication: chest pain/ACS  Allergies  Allergen Reactions  . Aspirin Other (See Comments)     cns bleed risk  . Codeine Rash    Patient Measurements: Height: 5\' 6"  (167.6 cm) Weight: 128 lb 8 oz (58.287 kg) IBW/kg (Calculated) : 59.3  Heparin Dosing Weight: 58.3 kg  Vital Signs: Temp: 97.8 F (36.6 C) (11/20 1945) Temp src: Oral (11/20 1945) BP: 150/79 mmHg (11/20 1945) Pulse Rate: 70  (11/20 1945)  Labs:  Basename 04/12/12 1527  HGB 12.2  HCT 36.9  PLT 238  APTT --  LABPROT 12.1  INR 0.90  HEPARINUNFRC --  CREATININE 0.82  CKTOTAL --  CKMB --  TROPONINI --    Estimated Creatinine Clearance: 58.8 ml/min (by C-G formula based on Cr of 0.82).   Medical History: Past Medical History  Diagnosis Date  . Impaired glucose tolerance 01/07/2011  . ANXIETY 01/01/2007  . BURSITIS, RIGHT HIP 06/04/2009  . CAD, NATIVE VESSEL 01/15/2009  . CHEST PAIN-PRECORDIAL 01/15/2009  . COPD 01/01/2007  . DEPRESSION 01/01/2007  . GROIN PAIN 06/20/2008  . Headache 01/01/2007  . HYPERTENSION 01/01/2007  . LOW BACK PAIN 01/01/2007  . Muscle weakness (generalized) 06/04/2009  . OSTEOARTHRITIS, HIP 07/01/2008  . OSTEOPOROSIS 01/01/2007  . Other symptoms involving cardiovascular system 01/15/2009  . Preventative health care 01/07/2011  . SYNCOPE 01/01/2007  . TRANSIENT ISCHEMIC ATTACK, HX OF 01/01/2007  . Eczema 01/08/2011  . Rosacea 01/08/2011  . Colon polyps   . Hemorrhoid   . Tubular adenoma of colon   . Pneumonia 1998  . Cataract     Medications:  Scheduled:    . amitriptyline  20 mg Oral QHS  . aspirin  324 mg Oral Pre-Cath  . aspirin  324 mg Oral NOW   Or  . aspirin  300 mg Rectal NOW  . aspirin EC  81 mg Oral Daily  . diazepam  5 mg Oral On Call  . metoprolol tartrate  12.5 mg Oral BID  . simvastatin  20 mg Oral q1800  . sodium chloride  3 mL Intravenous Q12H  . sodium chloride  3 mL Intravenous Q12H   . tiotropium  18 mcg Inhalation Daily    Assessment: 71 yr old female on heparin for ACS, to cardiac cath tomorrow.    Goal of Therapy:  Heparin level 0.3-0.7 units/ml Monitor platelets by anticoagulation protocol: Yes   Plan:  Heparin 3000 units IV bolus x 1, then run heparin at 700 units/hr. Check heparin level 6 hours after drip start.  Daily heparin level/CBC.   Wendie Simmer, PharmD, BCPS Clinical Pharmacist  Pager: [phone]   Marylouise Stacks 04/12/2012,8:00 PM

## 2012-04-12 NOTE — ED Provider Notes (Signed)
History     CSN: [id number]  Arrival date & time 04/12/12  1524   First MD Initiated Contact with Patient 04/12/12 1634      Chief Complaint  Patient presents with  . Shoulder Pain  . Arm Pain    (Consider location/radiation/quality/duration/timing/severity/associated sxs/prior treatment) Patient is a 71 y.o. female presenting with shoulder pain and arm pain.  Shoulder Pain This is a new problem. The current episode started 6 to 12 hours ago. The problem has not changed since onset.Pertinent negatives include no chest pain. Nothing aggravates the symptoms. Nothing relieves the symptoms. She has tried ASA for the symptoms. The treatment provided no relief.  Arm Pain Pertinent negatives include no chest pain.    Past Medical History  Diagnosis Date  . Impaired glucose tolerance 01/07/2011  . ANXIETY 01/01/2007  . BURSITIS, RIGHT HIP 06/04/2009  . CAD (coronary artery disease)     a. BMS to LAD 2010. b. NSTEMI with DES to LAD for ISR 2011. c. Patent stent 03/2012/Imdur added.  . CHEST PAIN-PRECORDIAL 01/15/2009  . COPD 01/01/2007    a. Chronic resp failure on home O2.  Marland Kitchen DEPRESSION 01/01/2007  . GROIN PAIN 06/20/2008  . Headache 01/01/2007  . HYPERTENSION 01/01/2007  . LOW BACK PAIN 01/01/2007  . Muscle weakness (generalized) 06/04/2009  . OSTEOARTHRITIS, HIP 07/01/2008  . OSTEOPOROSIS 01/01/2007  . SYNCOPE 01/01/2007  . TRANSIENT ISCHEMIC ATTACK, HX OF 01/01/2007  . Eczema 01/08/2011  . Rosacea 01/08/2011  . Colon polyps     H/o tubular adenoma of colon  . Hemorrhoid   . Pneumonia 1998  . Cataract   . Abnormal chest x-ray     03/2012: will need OP f/u.    Past Surgical History  Procedure Date  . Appendectomy   . Abdominal hysterectomy   . Oophorectomy     one ovary  . Sp lumbar disc surgury     Dr. Leslee Home  . S/p bilat cataract 2010  . Coronary stent placement   . Colonoscopy 01/25/2002    tubular adenoma,hemorrhoids, hyperplastic  colon polyps  . Colonoscopy 02/17/2005     hemorrhoids    Family History  Problem Relation Age of Onset  . Osteoporosis Mother   . Heart disease Sister   . Colon cancer    . Seizures Sister     epilepsy  . Cardiomyopathy Sister   . Heart attack Sister     History  Substance Use Topics  . Smoking status: Former Smoker -- 1.0 packs/day for 50 years    Types: Cigarettes    Quit date: 07/24/2005  . Smokeless tobacco: Never Used  . Alcohol Use: No    OB History    Grav Para Term Preterm Abortions TAB SAB Ect Mult Living                  Review of Systems  Cardiovascular: Negative for chest pain.  All other systems reviewed and are negative.    Allergies  Aspirin and Codeine  Home Medications   Current Outpatient Rx  Name  Route  Sig  Dispense  Refill  . AMITRIPTYLINE HCL 10 MG PO TABS   Oral   Take 20 mg by mouth at bedtime.          . ASPIRIN EC 81 MG PO TBEC   Oral   Take 81 mg by mouth daily.         Marland Kitchen VITAMIN D 1000 UNITS PO CAPS   Oral  Take 1,000 Units by mouth every evening.          Marland Kitchen LOVASTATIN 40 MG PO TABS   Oral   Take 20 mg by mouth every evening.         Marland Kitchen METOPROLOL TARTRATE 25 MG PO TABS   Oral   Take 12.5 mg by mouth Twice daily.          Marland Kitchen ONDANSETRON HCL 4 MG PO TABS   Oral   Take 4 mg by mouth 3 (three) times daily as needed. For nausea (take with tramadol)         . POLYETHYLENE GLYCOL 3350 PO POWD   Oral   Take 17 g by mouth daily as needed. For constipation         . TIOTROPIUM BROMIDE MONOHYDRATE 18 MCG IN CAPS   Inhalation   Place 18 mcg into inhaler and inhale daily.         . TRAMADOL HCL 50 MG PO TABS   Oral   Take 100 mg by mouth 3 (three) times daily as needed. For pain         . DOCUSATE SODIUM 100 MG PO CAPS   Oral   Take 100 mg by mouth daily as needed. For constipation         . ISOSORBIDE MONONITRATE 15 MG HALF TABLET   Oral   Take 0.5 tablets (15 mg total) by mouth daily.   30 tablet   6   . NITROGLYCERIN 0.4 MG SL  SUBL   Sublingual   Place 1 tablet (0.4 mg total) under the tongue every 5 (five) minutes as needed for chest pain (up to 3 doses).   25 tablet   4     BP 130/64  Pulse 60  Temp 97.7 F (36.5 C) (Oral)  Resp 18  Ht 5\' 6"  (1.676 m)  Wt 128 lb 8 oz (58.287 kg)  BMI 20.74 kg/m2  SpO2 98%  Physical Exam  Nursing note and vitals reviewed. Constitutional: She is oriented to person, place, and time. She appears well-developed and well-nourished. No distress.  HENT:  Head: Normocephalic and atraumatic.  Eyes: Pupils are equal, round, and reactive to light.  Neck: Normal range of motion.  Cardiovascular: Normal rate and intact distal pulses.  Exam reveals no friction rub.   Pulmonary/Chest: No respiratory distress. She has no wheezes.  Abdominal: Normal appearance. She exhibits no distension.  Musculoskeletal: Normal range of motion.  Neurological: She is alert and oriented to person, place, and time. No cranial nerve deficit.  Skin: Skin is warm and dry. No rash noted.  Psychiatric: She has a normal mood and affect. Her behavior is normal.    ED Course  Procedures (including critical care time)  Date: 04/12/2012  Rate: 76  Rhythm: normal sinus rhythm  QRS Axis: normal  Intervals: normal  ST/T Wave abnormalities: normal  Conduction Disutrbances: none  Narrative Interpretation: Poor wave progression /abnormal EKG      Labs Reviewed  CBC WITH DIFFERENTIAL - Abnormal; Notable for the following:    Eosinophils Relative 9 (*)     All other components within normal limits  BASIC METABOLIC PANEL - Abnormal; Notable for the following:    BUN 5 (*)     GFR calc non Af Amer 71 (*)     GFR calc Af Amer 82 (*)     All other components within normal limits  HEPARIN LEVEL (UNFRACTIONATED) - Abnormal; Notable for the following:  Heparin Unfractionated 0.75 (*)     All other components within normal limits  BASIC METABOLIC PANEL - Abnormal; Notable for the following:    BUN 5  (*)     GFR calc non Af Amer 70 (*)     GFR calc Af Amer 81 (*)     All other components within normal limits  LIPID PANEL - Abnormal; Notable for the following:    Triglycerides 160 (*)     All other components within normal limits  CBC - Abnormal; Notable for the following:    Hemoglobin 11.4 (*)     HCT 34.3 (*)     All other components within normal limits  PROTIME-INR  POCT I-STAT TROPONIN I  TROPONIN I  TROPONIN I  TROPONIN I   Dg Chest 2 View  04/12/2012  *RADIOLOGY REPORT*  Clinical Data: Left shoulder pain radiating down left arm.  CHEST - 2 VIEW  Comparison: 11/22/2011.  Findings: Trachea is midline. Heart size normal.  Lungs are emphysematous.  Biapical pleural thickening.  Calcified granuloma in the left upper lobe.  Difficult to exclude a new nodular density in the left midlung zone, superimposed with the left seventh posterior rib.  No pleural fluid.  IMPRESSION: Emphysema.  Difficult to exclude a developing nodular density in the left upper lobe.  Non emergent CT chest without contrast could be performed in further evaluation, as clinically indicated. These results will be called to the ordering clinician or representative by the Radiologist Assistant, and communication documented in the PACS Dashboard.   Original Report Authenticated By: Leanna Battles, M.D.      1. Unstable angina pectoris       MDM  Cards called and will admit        Nelia Shi, MD 04/13/12 2300

## 2012-04-12 NOTE — Progress Notes (Signed)
Pt not complaining of any chest pain at this time. Notified Hurman Horn, PA-C and received verbal order to not start the scheduled Nitro titratable drip unless patient begins to have chest pain. Vital signs stable at this time. Will continue to monitor patient.  Harless Litten, RN 04/12/12

## 2012-04-12 NOTE — ED Notes (Signed)
Per radiology chest xray : Emphysema difficult to exclude a developing nodule in the upper left node. Non emergency CT chest wo contrast could be performed. Further evaluation as clinically indicated.

## 2012-04-12 NOTE — ED Notes (Signed)
Pt c/o left shoulder pain and heaviness similar to when had stents in past; pt sts pain down left arm; pt denies any new SOB but is on home O2

## 2012-04-13 ENCOUNTER — Encounter (HOSPITAL_COMMUNITY)
Admission: EM | Disposition: A | Discharge status: Home / Self Care | Payer: Self-pay | Source: Home / Self Care | Attending: Emergency Medicine

## 2012-04-13 ENCOUNTER — Encounter (HOSPITAL_COMMUNITY): Payer: Self-pay | Admitting: *Deleted

## 2012-04-13 ENCOUNTER — Telehealth (HOSPITAL_COMMUNITY): Payer: Self-pay | Admitting: *Deleted

## 2012-04-13 DIAGNOSIS — I2510 Atherosclerotic heart disease of native coronary artery without angina pectoris: Secondary | ICD-10-CM

## 2012-04-13 HISTORY — PX: LEFT HEART CATHETERIZATION WITH CORONARY ANGIOGRAM: SHX5451

## 2012-04-13 LAB — LIPID PANEL
Cholesterol: 155 mg/dL (ref 0–200)
HDL: 57 mg/dL (ref >39)
Triglycerides: 160 mg/dL — ABNORMAL HIGH (ref <150)
VLDL: 32 mg/dL (ref 0–40)

## 2012-04-13 LAB — BASIC METABOLIC PANEL
CO2: 32 mEq/L (ref 19–32)
Calcium: 8.6 mg/dL (ref 8.4–10.5)
GFR calc non Af Amer: 70 mL/min — ABNORMAL LOW (ref >90)
Potassium: 3.9 mEq/L (ref 3.5–5.1)
Sodium: 140 mEq/L (ref 135–145)

## 2012-04-13 LAB — HEPARIN LEVEL (UNFRACTIONATED): Heparin Unfractionated: 0.75 IU/mL — ABNORMAL HIGH (ref 0.30–0.70)

## 2012-04-13 LAB — CBC
HCT: 34.3 % — ABNORMAL LOW (ref 36.0–46.0)
Hemoglobin: 11.4 g/dL — ABNORMAL LOW (ref 12.0–15.0)
MCH: 27.1 pg (ref 26.0–34.0)
MCHC: 33.2 g/dL (ref 30.0–36.0)

## 2012-04-13 SURGERY — LEFT HEART CATHETERIZATION WITH CORONARY ANGIOGRAM
Anesthesia: LOCAL

## 2012-04-13 MED ORDER — LIDOCAINE-EPINEPHRINE 1 %-1:100000 IJ SOLN
INTRAMUSCULAR | Status: AC
  Filled 2012-04-13: qty 1

## 2012-04-13 MED ORDER — FENTANYL CITRATE 0.05 MG/ML IJ SOLN
INTRAMUSCULAR | Status: AC
  Filled 2012-04-13: qty 2

## 2012-04-13 MED ORDER — NITROGLYCERIN 0.4 MG SL SUBL: 0.4000 mg | SUBLINGUAL_TABLET | SUBLINGUAL | Status: AC | PRN

## 2012-04-13 MED ORDER — SODIUM CHLORIDE 0.9 % IV SOLN: INTRAVENOUS | Status: AC

## 2012-04-13 MED ORDER — NITROGLYCERIN 0.2 MG/ML ON CALL CATH LAB
INTRAVENOUS | Status: AC
  Filled 2012-04-13: qty 1

## 2012-04-13 MED ORDER — MIDAZOLAM HCL 2 MG/2ML IJ SOLN
INTRAMUSCULAR | Status: AC
  Filled 2012-04-13: qty 2

## 2012-04-13 MED ORDER — ISOSORBIDE MONONITRATE 15 MG HALF TABLET: 15.0000 mg | ORAL_TABLET | Freq: Every day | ORAL | Status: DC

## 2012-04-13 MED ORDER — HEPARIN (PORCINE) IN NACL 2-0.9 UNIT/ML-% IJ SOLN
INTRAMUSCULAR | Status: AC
  Filled 2012-04-13: qty 1500

## 2012-04-13 MED ORDER — LIDOCAINE HCL (PF) 1 % IJ SOLN
INTRAMUSCULAR | Status: AC
  Filled 2012-04-13: qty 30

## 2012-04-13 MED ORDER — ISOSORBIDE MONONITRATE 15 MG HALF TABLET
15.0000 mg | ORAL_TABLET | Freq: Every day | ORAL | Status: DC
  Filled 2012-04-13: qty 1

## 2012-04-13 NOTE — Progress Notes (Signed)
Utilization review completed.  

## 2012-04-13 NOTE — H&P (View-Only) (Signed)
    SUBJECTIVE: Mild shoulder pain this am. No chest pain or SOB  BP 112/68  Pulse 60  Temp 97.7 F (36.5 C) (Oral)  Resp 18  Ht 5' 6" (1.676 m)  Wt 128 lb 8 oz (58.287 kg)  BMI 20.74 kg/m2  SpO2 97% No intake or output data in the 24 hours ending 04/13/12 0727  PHYSICAL EXAM General: Well developed, well nourished, in no acute distress. Alert and oriented x 3.  Psych:  Good affect, responds appropriately Neck: No JVD. No masses noted.  Lungs: Clear bilaterally with no wheezes or rhonci noted.  Heart: RRR with no murmurs noted. Abdomen: Bowel sounds are present. Soft, non-tender.  Extremities: No lower extremity edema.   LABS: Basic Metabolic Panel:  Basename 04/13/12 0211 04/12/12 1527  NA 140 139  K 3.9 4.3  CL 103 101  CO2 32 30  GLUCOSE 92 96  BUN 5* 5*  CREATININE 0.83 0.82  CALCIUM 8.6 8.9  MG -- --  PHOS -- --   CBC:  Basename 04/13/12 0211 04/12/12 1527  WBC 6.7 7.6  NEUTROABS -- 3.5  HGB 11.4* 12.2  HCT 34.3* 36.9  MCV 81.7 83.9  PLT 210 238   Cardiac Enzymes:  Basename 04/13/12 0210 04/12/12 1954  CKTOTAL -- --  CKMB -- --  CKMBINDEX -- --  TROPONINI <0.30 <0.30   Fasting Lipid Panel:  Basename 04/13/12 0211  CHOL 155  HDL 57  LDLCALC 66  TRIG 160*  CHOLHDL 2.7  LDLDIRECT --    Current Meds:    . amitriptyline  20 mg Oral QHS  . [COMPLETED] aspirin  324 mg Oral NOW   Or  . [COMPLETED] aspirin  300 mg Rectal NOW  . aspirin EC  81 mg Oral Daily  . diazepam  5 mg Oral On Call  . [COMPLETED] heparin  3,000 Units Intravenous Once  . metoprolol tartrate  12.5 mg Oral BID  . simvastatin  20 mg Oral q1800  . sodium chloride  3 mL Intravenous Q12H  . sodium chloride  3 mL Intravenous Q12H  . tiotropium  18 mcg Inhalation Daily  . [DISCONTINUED] aspirin  324 mg Oral Pre-Cath     ASSESSMENT AND PLAN:  1. Chest pain/shoulder pain concerning for unstable angina. Known to have CAD with previous bare metal stent in LAD followed by  restenosis and placement of drug eluting stent in the restenotic area. Same presentation each of those times. Cardiac markers negative. Plans for cardiac cath today. R/B reviewed. Labs ok.    Alexis Higgins  11/21/20137:27 AM  

## 2012-04-13 NOTE — Progress Notes (Signed)
Pt and husband educated and informed of D/C information. Pt demonstrates understanding of which medication to start and which to continue taking. Pt understands to call MD if any s/s of bleeding at groin. Pt and husband have no further questions or concerns.

## 2012-04-13 NOTE — Progress Notes (Signed)
    SUBJECTIVE: Mild shoulder pain this am. No chest pain or SOB  BP 112/68  Pulse 60  Temp 97.7 F (36.5 C) (Oral)  Resp 18  Ht 5\' 6"  (1.676 m)  Wt 128 lb 8 oz (58.287 kg)  BMI 20.74 kg/m2  SpO2 97% No intake or output data in the 24 hours ending 04/13/12 0727  PHYSICAL EXAM General: Well developed, well nourished, in no acute distress. Alert and oriented x 3.  Psych:  Good affect, responds appropriately Neck: No JVD. No masses noted.  Lungs: Clear bilaterally with no wheezes or rhonci noted.  Heart: RRR with no murmurs noted. Abdomen: Bowel sounds are present. Soft, non-tender.  Extremities: No lower extremity edema.   LABS: Basic Metabolic Panel:  Basename 04/13/12 0211 04/12/12 1527  NA 140 139  K 3.9 4.3  CL 103 101  CO2 32 30  GLUCOSE 92 96  BUN 5* 5*  CREATININE 0.83 0.82  CALCIUM 8.6 8.9  MG -- --  PHOS -- --   CBC:  Basename 04/13/12 0211 04/12/12 1527  WBC 6.7 7.6  NEUTROABS -- 3.5  HGB 11.4* 12.2  HCT 34.3* 36.9  MCV 81.7 83.9  PLT 210 238   Cardiac Enzymes:  Basename 04/13/12 0210 04/12/12 1954  CKTOTAL -- --  CKMB -- --  CKMBINDEX -- --  TROPONINI <0.30 <0.30   Fasting Lipid Panel:  Basename 04/13/12 0211  CHOL 155  HDL 57  LDLCALC 66  TRIG 160*  CHOLHDL 2.7  LDLDIRECT --    Current Meds:    . amitriptyline  20 mg Oral QHS  . [COMPLETED] aspirin  324 mg Oral NOW   Or  . [COMPLETED] aspirin  300 mg Rectal NOW  . aspirin EC  81 mg Oral Daily  . diazepam  5 mg Oral On Call  . [COMPLETED] heparin  3,000 Units Intravenous Once  . metoprolol tartrate  12.5 mg Oral BID  . simvastatin  20 mg Oral q1800  . sodium chloride  3 mL Intravenous Q12H  . sodium chloride  3 mL Intravenous Q12H  . tiotropium  18 mcg Inhalation Daily  . [DISCONTINUED] aspirin  324 mg Oral Pre-Cath     ASSESSMENT AND PLAN:  1. Chest pain/shoulder pain concerning for unstable angina. Known to have CAD with previous bare metal stent in LAD followed by  restenosis and placement of drug eluting stent in the restenotic area. Same presentation each of those times. Cardiac markers negative. Plans for cardiac cath today. R/B reviewed. Labs ok.    Alexis Higgins  11/21/20137:27 AM

## 2012-04-13 NOTE — Progress Notes (Signed)
Pt returned to unit from cath lab. VSS, pt comfortable and resting. Will continue to monitor.

## 2012-04-13 NOTE — Progress Notes (Signed)
ANTICOAGULATION CONSULT NOTE   Pharmacy Consult for Heparin Indication: chest pain/ACS  Allergies  Allergen Reactions  . Aspirin Other (See Comments)     cns bleed risk  . Codeine Rash    Patient Measurements: Height: 5\' 6"  (167.6 cm) Weight: 128 lb 8 oz (58.287 kg) IBW/kg (Calculated) : 59.3  Heparin Dosing Weight: 58.3 kg  Vital Signs: Temp: 97.8 F (36.6 C) (11/20 1945) Temp src: Oral (11/20 1945) BP: 150/79 mmHg (11/20 1945) Pulse Rate: 70  (11/20 1945)  Labs:  Basename 04/13/12 0211 04/13/12 0210 04/12/12 1954 04/12/12 1527  HGB 11.4* -- -- 12.2  HCT 34.3* -- -- 36.9  PLT 210 -- -- 238  APTT -- -- -- --  LABPROT -- -- -- 12.1  INR -- -- -- 0.90  HEPARINUNFRC 0.75* -- -- --  CREATININE 0.83 -- -- 0.82  CKTOTAL -- -- -- --  CKMB -- -- -- --  TROPONINI -- <0.30 <0.30 --    Estimated Creatinine Clearance: 58 ml/min (by C-G formula based on Cr of 0.83).  Assessment: 71 yr old female on heparin for ACS, to cardiac cath tomorrow.    Goal of Therapy:  Heparin level 0.3-0.7 units/ml Monitor platelets by anticoagulation protocol: Yes   Plan:  Initial level slightly elevated, but drawn just 5 hours after bolus, so expect level to decrease with more time.  Will continue heparin at current rate, f/u after cath today.  Aisling Emigh, Gary Fleet 04/13/2012,3:31 AM

## 2012-04-13 NOTE — Discharge Summary (Signed)
See full note today. cdm 

## 2012-04-13 NOTE — Discharge Summary (Signed)
Discharge Summary   Patient ID: Alexis Higgins MRN: [id number], DOB/AGE: 12/05/1940 71 y.o. Admit date: 04/12/2012 D/C date:     04/13/2012  Primary Cardiologist: Clifton James  Primary Discharge Diagnoses:  1. Unstable angina - Imdur added 2. CAD - patent proximal LAD stent 04/13/12 although symptoms concerning for [place] 3. Abnormal chest xray - will need adressing at f/u outpatient appt  Secondary Discharge Diagnoses:  1. Anxiety 2. Impaired glucose tolerance 3. Hip bursitis 4. 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 Depression 6. H/o groin pain 2010 7. HTN 8. Low back pain 9. H/o muscle weakness 10. Osteoarthritis 11. Osteoporosis 12. H/o TIA 2007 13. H/o syncope 14. Eczema 15. Rosacea 16. Tubular adenoma of colon  17. Hemorrhoid 18. Cataract 19. 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 Her symptoms persisted all day and were not affected by movement of the shoulder or arm. She has chronic shortness of breath and has oxygen-dependent COPD, but she denied any change in her chronic dyspnea. She denied orthopnea, PND, or leg swelling. She was not tachycardic, tachypnic or hypoxic. Initial cardiac markers and EKG were unremarkable. However, her symptoms were described as exactly like previous episodes of unstable angina where she was found to have severe proximal LAD stenosis requiring PCI. Dr. Excell Seltzer discussed possible diagnostic options including stress testing versus cath. Ultimately cardiac cath was chosen. CE's remained negative.  She underwent cath demonstrating patent stent in the proximal LAD, mild plaque in Cx but no obstructive dz, 30% eccentric calcified stenosis/10% mid stenosis of RCA. EF 60%. Dr. Clifton James recommended continued medical  management and aggressive risk factor reduction.   Of note, the patient had a CXR this admission that was abnormal that reported "Difficult to exclude a developing nodular density in the left upper lobe. Non emergent CT chest without contrast could be performed in further evaluation, as clinically indicated." Dr. Excell Seltzer recommended to obtain this as an outpatient given the contrast she would be getting with cath - Dr. Clifton James is aware of this and per our discussion, he plans to address this as an outpatient in follow-up. Her follow-up appointment was scheduled for 04/27/12 with Tereso Newcomer PA-C on a day when Dr. Clifton James is also in the office. He has added Imdur 15mg  daily.  Discharge Vitals: Blood pressure 108/85, pulse 72, temperature 97.7 F (36.5 C), temperature source Oral, resp. rate 18, height 5\' 6"  (1.676 m), weight 128 lb 8 oz (58.287 kg), SpO2 98.00%.  Labs: Lab Results  Component Value Date   WBC 6.7 04/13/2012   HGB 11.4* 04/13/2012   HCT 34.3* 04/13/2012   MCV 81.7 04/13/2012   PLT 210 04/13/2012     Lab 04/13/12 0211  NA 140  K 3.9  CL 103  CO2 32  BUN 5*  CREATININE 0.83  CALCIUM 8.6  PROT --  BILITOT --  ALKPHOS --  ALT --  AST --  GLUCOSE 92    Basename 04/13/12 0907 04/13/12 0210 04/12/12 1954  CKTOTAL -- -- --  CKMB -- -- --  TROPONINI <0.30 <0.30 <0.30   Lab Results  Component Value Date   CHOL 155 04/13/2012   HDL 57 04/13/2012   LDLCALC 66 04/13/2012   TRIG 160* 04/13/2012    Diagnostic Studies/Procedures   Dg  Chest 2 View 04/12/2012  *RADIOLOGY REPORT*  Clinical Data: Left shoulder pain radiating down left arm.  CHEST - 2 VIEW  Comparison: 11/22/2011.  Findings: Trachea is midline. Heart size normal.  Lungs are emphysematous.  Biapical pleural thickening.  Calcified granuloma in the left upper lobe.  Difficult to exclude a new nodular density in the left midlung zone, superimposed with the left seventh posterior rib.  No pleural fluid.   IMPRESSION: Emphysema.  Difficult to exclude a developing nodular density in the left upper lobe.  Non emergent CT chest without contrast could be performed in further evaluation, as clinically indicated. These results will be called to the ordering clinician or representative by the Radiologist Assistant, and communication documented in the PACS Dashboard.   Original Report Authenticated By: Leanna Battles, M.D.     Discharge Medications   Current Discharge Medication List    START taking these medications   Details  isosorbide mononitrate (IMDUR) 15 mg TB24 Take 0.5 tablets (15 mg total) by mouth daily. Qty: 30 tablet, Refills: 6    nitroGLYCERIN (NITROSTAT) 0.4 MG SL tablet Place 1 tablet (0.4 mg total) under the tongue every 5 (five) minutes as needed for chest pain (up to 3 doses). Qty: 25 tablet, Refills: 4      CONTINUE these medications which have NOT CHANGED   Details  amitriptyline (ELAVIL) 10 MG tablet Take 20 mg by mouth at bedtime.     aspirin EC 81 MG tablet Take 81 mg by mouth daily.    Cholecalciferol (VITAMIN D) 1000 UNITS capsule Take 1,000 Units by mouth every evening.     lovastatin (MEVACOR) 40 MG tablet Take 20 mg by mouth every evening.    metoprolol tartrate (LOPRESSOR) 25 MG tablet Take 12.5 mg by mouth Twice daily.     ondansetron (ZOFRAN) 4 MG tablet Take 4 mg by mouth 3 (three) times daily as needed. For nausea (take with tramadol)    polyethylene glycol powder (MIRALAX) powder Take 17 g by mouth daily as needed. For constipation    tiotropium (SPIRIVA) 18 MCG inhalation capsule Place 18 mcg into inhaler and inhale daily.    traMADol (ULTRAM) 50 MG tablet Take 100 mg by mouth 3 (three) times daily as needed. For pain    docusate sodium (COLACE) 100 MG capsule Take 100 mg by mouth daily as needed. For constipation        Disposition   The patient will be discharged in stable condition to home. Discharge Orders    Future Appointments: Provider:  Department: Dept Phone: Center:   04/27/2012 8:30 AM Beatrice Lecher, PA [facility] [place]) [phone] [id number]     Future Orders Please Complete By Expires   Diet - low sodium heart healthy      Increase activity slowly      Comments:   No driving for 2 days. No lifting over 5 lbs for 1 week. No sexual activity for 1 week. Keep procedure site clean & dry. If you notice increased pain, swelling, bleeding or pus, call/return!  You may shower, but no soaking baths/hot tubs/pools for 1 week.     Follow-up Information    Follow up with Tereso Newcomer, PA. (04/27/12 at 8:30am)    Contact information:   [address] [place] [address] [phone]           Duration of Discharge Encounter: Greater than 30 minutes including physician and PA time.  Signed, Ronie Spies PA-C  04/13/2012, 3:53 PM

## 2012-04-13 NOTE — CV Procedure (Signed)
   Cardiac Catheterization Operative Report  Alexis Higgins [id number] 11/21/20132:13 PM Oliver Barre, MD  Procedure Performed:  1. Left Heart Catheterization 2. Selective Coronary Angiography 3. Left ventricular angiogram  Operator: Verne Carrow, MD  Indication:  71 yo female with history of CAD with previous placement of bare metal stent in proximal LAD with restenosis and subsequent placement of drug eluting stent in the proximal LAD in 2011. Now readmitted with shoulder pain which was her anginal equivalent in the past. Cardiac enzymes negative.                               Procedure Details: The risks, benefits, complications, treatment options, and expected outcomes were discussed with the patient. The patient and/or family concurred with the proposed plan, giving informed consent. The patient was brought to the cath lab after IV hydration was begun and oral premedication was given. The patient was further sedated with Versed and Fentanyl. The right groin was prepped and draped in the usual manner. Using the modified Seldinger access technique, a 5 French sheath was placed in the right femoral artery. Standard diagnostic catheters were used to perform selective coronary angiography. A pigtail catheter was used to perform a left ventricular angiogram.  There were no immediate complications. The patient was taken to the recovery area in stable condition.   Hemodynamic Findings: Central aortic pressure: 138/61 Left ventricular pressure: 133/0/24  Angiographic Findings:  Left main: No obstructive disease noted.   Left Anterior Descending Artery: Large caliber vessel that courses to the apex. There is a proximal stent with no evidence of restenosis. The mid and distal LAD has no obstructive disease.   Circumflex Artery: Small caliber vessel with early small caliber intermediate branch and then  termination into a small obtuse marginal branch. There is mild plaque disease but  no obstructive lesions.   Right Coronary Artery: Large, dominant vessel with 30% eccentric calcified stenosis. 10% mid stenosis.   Left Ventricular Angiogram: LVEF=60%.  Impression: 1. Single vessel CAD with patent stent proximal LAD 2. Preserved LV systolic function.   Recommendations: Continue medical management and aggressive risk factor reduction. Consider long acting nitrate. Will discuss with patient. D/c home after bedrest and f/u with me in 3-4 weeks.        Complications:  None. The patient tolerated the procedure well.

## 2012-04-13 NOTE — Interval H&P Note (Signed)
History and Physical Interval Note:  04/13/2012 1:42 PM  Alexis Higgins  has presented today for cardiac cath with the diagnosis of Chest pain  The various methods of treatment have been discussed with the patient and family. After consideration of risks, benefits and other options for treatment, the patient has consented to  Procedure(s) (LRB) with comments: LEFT HEART CATHETERIZATION WITH CORONARY ANGIOGRAM (N/A) as a surgical intervention .  The patient's history has been reviewed, patient examined, no change in status, stable for surgery.  I have reviewed the patient's chart and labs.  Questions were answered to the patient's satisfaction.     Ra Pfiester

## 2012-04-13 NOTE — Telephone Encounter (Signed)
Attempt made to contact Ms. Kerwood for [facility] Rehab Referral.  No answer to call, message left on machine and letter sent.  Cathie Olden RN

## 2012-04-27 ENCOUNTER — Encounter: Payer: Self-pay | Admitting: Physician Assistant

## 2012-04-27 ENCOUNTER — Ambulatory Visit (INDEPENDENT_AMBULATORY_CARE_PROVIDER_SITE_OTHER): Payer: Medicare Other | Admitting: Physician Assistant

## 2012-04-27 ENCOUNTER — Encounter: Payer: Medicare Other | Admitting: Physician Assistant

## 2012-04-27 VITALS — BP 139/86 | HR 86 | Resp 18 | Ht 65.0 in | Wt 131.0 lb

## 2012-04-27 DIAGNOSIS — I2510 Atherosclerotic heart disease of native coronary artery without angina pectoris: Secondary | ICD-10-CM

## 2012-04-27 DIAGNOSIS — R918 Other nonspecific abnormal finding of lung field: Secondary | ICD-10-CM

## 2012-04-27 DIAGNOSIS — E782 Mixed hyperlipidemia: Secondary | ICD-10-CM

## 2012-04-27 DIAGNOSIS — R9389 Abnormal findings on diagnostic imaging of other specified body structures: Secondary | ICD-10-CM

## 2012-04-27 DIAGNOSIS — I10 Essential (primary) hypertension: Secondary | ICD-10-CM

## 2012-04-27 NOTE — Progress Notes (Signed)
[address], [address] [place], [place]  [address] Phone: [phone], Fax:  [phone]  Date:  04/27/2012   Name:  Alexis Higgins   DOB:  Jul 01, 1940   MRN:  [id number]  PCP:  Oliver Barre, MD  Primary Cardiologist:  Dr. Verne Carrow  Primary Electrophysiologist:  None    History of Present Illness: Alexis Higgins is a 71 y.o. female who returns for follow up after recent admission for unstable angina.  She has a hx of CAD, s/p BMS to LAD in 8/10, mild PAD (right CFA by doppler in 9/10), COPD, HTN.  Admitted in 9/11 with CP and had DES to LAD for ISR.  Admitted 11/20-11/21.  She presented with left shoulder pain. This was reminiscent of her previous angina. Cardiac markers were negative. LHC 04/13/12:  pLAD stent patent, pRCA 30%, mRCA 10%, EF 60%. Medical therapy was recommended. She was placed on isosorbide. Chest x-ray demonstrated questionable left upper lobe nodule. Outpatient chest CT was recommended.  She is doing well.  Dyspnea is stable.  No chest pain.  No orthopnea, PND, edema.  No syncope.    Labs (11/13):   K 3.9, creatinine 0.83, Hgb 11.4, LDL 66  Wt Readings from Last 3 Encounters:  04/27/12 131 lb (59.421 kg)  04/12/12 128 lb 8 oz (58.287 kg)  04/12/12 128 lb 8 oz (58.287 kg)     Past Medical History  Diagnosis Date  . Impaired glucose tolerance 01/07/2011  . ANXIETY 01/01/2007  . BURSITIS, RIGHT HIP 06/04/2009  . CAD (coronary artery disease)     a. BMS to LAD 2010. b. NSTEMI with DES to LAD for ISR 2011. c. Patent stent 03/2012/Imdur added.  . CHEST PAIN-PRECORDIAL 01/15/2009  . COPD 01/01/2007    a. Chronic resp failure on home O2.  Marland Kitchen DEPRESSION 01/01/2007  . GROIN PAIN 06/20/2008  . Headache 01/01/2007  . HYPERTENSION 01/01/2007  . LOW BACK PAIN 01/01/2007  . Muscle weakness (generalized) 06/04/2009  . OSTEOARTHRITIS, HIP 07/01/2008  . OSTEOPOROSIS 01/01/2007  . SYNCOPE 01/01/2007  . TRANSIENT ISCHEMIC ATTACK, HX OF 01/01/2007  . Eczema  01/08/2011  . Rosacea 01/08/2011  . Colon polyps     H/o tubular adenoma of colon  . Hemorrhoid   . Pneumonia 1998  . Cataract   . Abnormal chest x-ray     03/2012: will need OP f/u.    Current Outpatient Prescriptions  Medication Sig Dispense Refill  . amitriptyline (ELAVIL) 10 MG tablet Take 20 mg by mouth at bedtime.       Marland Kitchen aspirin EC 81 MG tablet Take 81 mg by mouth daily.      . Cholecalciferol (VITAMIN D) 1000 UNITS capsule Take 1,000 Units by mouth every evening.       . docusate sodium (COLACE) 100 MG capsule Take 100 mg by mouth daily as needed. For constipation      . isosorbide mononitrate (IMDUR) 15 mg TB24 Take 0.5 tablets (15 mg total) by mouth daily.  30 tablet  6  . lovastatin (MEVACOR) 40 MG tablet Take 20 mg by mouth every evening.      . metoprolol tartrate (LOPRESSOR) 25 MG tablet Take 12.5 mg by mouth Twice daily.       . nitroGLYCERIN (NITROSTAT) 0.4 MG SL tablet Place 1 tablet (0.4 mg total) under the tongue every 5 (five) minutes as needed for chest pain (up to 3 doses).  25 tablet  4  . ondansetron (ZOFRAN) 4 MG  tablet Take 4 mg by mouth 3 (three) times daily as needed. For nausea (take with tramadol)      . polyethylene glycol powder (MIRALAX) powder Take 17 g by mouth daily as needed. For constipation      . tiotropium (SPIRIVA) 18 MCG inhalation capsule Place 18 mcg into inhaler and inhale daily.      . traMADol (ULTRAM) 50 MG tablet Take 100 mg by mouth 3 (three) times daily as needed. For pain        Allergies: Allergies  Allergen Reactions  . Aspirin Other (See Comments)     cns bleed risk  . Codeine Rash    Social History:  The patient  reports that she quit smoking about 6 years ago. Her smoking use included Cigarettes. She has a 50 pack-year smoking history. She has never used smokeless tobacco. She reports that she does not drink alcohol or use illicit drugs.   ROS:  Please see the history of present illness.    All other systems reviewed and  negative.   PHYSICAL EXAM: VS:  BP 139/86  Pulse 86  Resp 18  Ht 5\' 5"  (1.651 m)  Wt 131 lb (59.421 kg)  BMI 21.80 kg/m2  SpO2 93% Well nourished, well developed, in no acute distress HEENT: normal Neck: no JVD Cardiac:  normal S1, S2; RRR; no murmur Lungs:  clear to auscultation bilaterally, no wheezing, rhonchi or rales Abd: soft, nontender, no hepatomegaly Ext: no edema; right groin without hematoma or bruit  Skin: warm and dry Neuro:  CNs 2-12 intact, no focal abnormalities noted  EKG:  NSR, HR 78, no acute changes.      ASSESSMENT AND PLAN:  1. Coronary Artery Disease:  No further angina.  Tolerating Imdur.  Continue current Rx with ASA, statin, beta blocker and nitrates.    2. Hyperlipidemia:  Controlled.  Continue current Rx.  3. Hypertension:  Controlled.  Continue current therapy.  4. Lung Nodule:   Schedule Chest CT with contrast.  5. Disposition:   Follow up with Dr. Verne Carrow in 3 mos.  Signed, Tereso Newcomer, PA-C  4:20 PM 04/27/2012

## 2012-04-27 NOTE — Patient Instructions (Addendum)
I HAVE PLACE AN ORDER FOR A CHEST CT WITH CONTRAST TO BE DONE IN OUR OFFICE SOMETIME AFTER THE 1ST WEEK IN January PER YOUR REQUEST. PLEASE CALL [phone] WHEN YOU ARE READY TO SCHEDULE TEST.  YOU HAVE A FOLLOW UP APPT WITH DR. Clifton James ON 07/26/12 @ 4:15  NO CHANGES WERE MADE TODAY

## 2012-04-28 ENCOUNTER — Encounter: Payer: Medicare Other | Admitting: Physician Assistant

## 2012-05-19 ENCOUNTER — Other Ambulatory Visit: Payer: Self-pay | Admitting: *Deleted

## 2012-05-19 DIAGNOSIS — I2510 Atherosclerotic heart disease of native coronary artery without angina pectoris: Secondary | ICD-10-CM

## 2012-05-30 ENCOUNTER — Encounter: Payer: Self-pay | Admitting: *Deleted

## 2012-05-30 ENCOUNTER — Other Ambulatory Visit (INDEPENDENT_AMBULATORY_CARE_PROVIDER_SITE_OTHER): Payer: Medicare Other

## 2012-05-30 DIAGNOSIS — I2510 Atherosclerotic heart disease of native coronary artery without angina pectoris: Secondary | ICD-10-CM

## 2012-05-30 LAB — BASIC METABOLIC PANEL
BUN: 7 mg/dL (ref 6–23)
Calcium: 8.6 mg/dL (ref 8.4–10.5)
Creatinine, Ser: 0.9 mg/dL (ref 0.4–1.2)
GFR: 69.16 mL/min (ref >60.00)
Potassium: 3.4 mEq/L — ABNORMAL LOW (ref 3.5–5.1)

## 2012-06-01 ENCOUNTER — Ambulatory Visit (INDEPENDENT_AMBULATORY_CARE_PROVIDER_SITE_OTHER)
Admission: RE | Admit: 2012-06-01 | Discharge: 2012-06-01 | Disposition: A | Discharge status: Home / Self Care | Payer: Medicare Other | Source: Ambulatory Visit | Attending: Physician Assistant | Admitting: Physician Assistant

## 2012-06-01 DIAGNOSIS — R918 Other nonspecific abnormal finding of lung field: Secondary | ICD-10-CM

## 2012-06-01 DIAGNOSIS — R9389 Abnormal findings on diagnostic imaging of other specified body structures: Secondary | ICD-10-CM

## 2012-06-01 MED ORDER — IOHEXOL 300 MG/ML  SOLN
80.0000 mL | Freq: Once | INTRAMUSCULAR | Status: AC | PRN
  Administered 2012-06-01: 80 mL via INTRAVENOUS

## 2012-06-11 ENCOUNTER — Encounter: Payer: Self-pay | Admitting: Internal Medicine

## 2012-06-12 ENCOUNTER — Other Ambulatory Visit: Payer: Self-pay | Admitting: Internal Medicine

## 2012-06-12 DIAGNOSIS — R0600 Dyspnea, unspecified: Secondary | ICD-10-CM

## 2012-06-12 NOTE — Telephone Encounter (Signed)
Received mychart message:  We could ask Home Health to do ambulatory O2 sat check and let us know the results, to check your oxygen with ambulation.  If they are not able, we would need to see you in the office, and/or seeing Dr Alferd Apa might be helpful as well.  We'll start with the home health idea to see if this will work. thanks ===View-only below this line===   ----- Message -----    From: Kathee Delton    Sent: 06/11/2012  4:45 PM EST      To: Oliver Barre, MD Subject: Non-Urgent Medical Question  Doctor Jonny Ruiz, I am having a very hard time with my breathing. I can breathe fine when I am sitting and doing handwork however, when I stand or walk I lose my breath. When sweeping the kitchen floor (10x12) I have to sit down three times to catch my breath before I finish. Even emptying the dishwasher wears me out. I am getting so frustrated with myself because I don't seem to be able to do anything! Please let me know if there is anything we can do. I think I need more oxygen than I am getting, I don't know. I would rather not have to come in if possible. I have been going to so many Doctors. I have my six month checkup with you on February 28th and with the other doctors, again in March.   I hope you can help me. Please let me know. Thank you, Cathren Harsh   For home health to see if can assist with this - will order

## 2012-06-20 ENCOUNTER — Other Ambulatory Visit: Payer: Self-pay

## 2012-06-20 MED ORDER — METOPROLOL TARTRATE 25 MG PO TABS: 12.5000 mg | ORAL_TABLET | Freq: Two times a day (BID) | ORAL | Status: DC

## 2012-06-23 ENCOUNTER — Telehealth: Payer: Self-pay | Admitting: Internal Medicine

## 2012-06-23 MED ORDER — ALBUTEROL SULFATE 0.63 MG/3ML IN NEBU: 1.0000 | INHALATION_SOLUTION | Freq: Four times a day (QID) | RESPIRATORY_TRACT | Status: DC | PRN

## 2012-06-23 NOTE — Telephone Encounter (Signed)
Received documentation re: ambulatory sats ok.  Suggestion was for alb neb's prn - Done erx  [name] to let pt know

## 2012-06-23 NOTE — Telephone Encounter (Signed)
Patient informed .  She does not have a nebulizer and would like to know where to get or rx?

## 2012-06-23 NOTE — Telephone Encounter (Signed)
I sent the rx for the med to the pharmacy  Pt to ask [facility] to supply the machine

## 2012-06-26 ENCOUNTER — Telehealth: Payer: Self-pay | Admitting: Internal Medicine

## 2012-06-26 NOTE — Telephone Encounter (Signed)
Faxed to [facility] and informed the patient

## 2012-06-26 NOTE — Telephone Encounter (Signed)
Done hardcopy to robin  

## 2012-06-26 NOTE — Telephone Encounter (Signed)
Patient informed. 

## 2012-06-26 NOTE — Telephone Encounter (Signed)
Patient needs an order for a nebulizer sent to [facility]

## 2012-07-05 ENCOUNTER — Encounter: Payer: Self-pay | Admitting: Internal Medicine

## 2012-07-18 ENCOUNTER — Other Ambulatory Visit (INDEPENDENT_AMBULATORY_CARE_PROVIDER_SITE_OTHER): Payer: Medicare Other

## 2012-07-18 DIAGNOSIS — R7309 Other abnormal glucose: Secondary | ICD-10-CM

## 2012-07-18 DIAGNOSIS — I10 Essential (primary) hypertension: Secondary | ICD-10-CM

## 2012-07-18 DIAGNOSIS — R7302 Impaired glucose tolerance (oral): Secondary | ICD-10-CM

## 2012-07-18 LAB — CBC WITH DIFFERENTIAL/PLATELET
Basophils Absolute: 0.1 10*3/uL (ref 0.0–0.1)
Eosinophils Relative: 7.1 % — ABNORMAL HIGH (ref 0.0–5.0)
Monocytes Absolute: 0.5 10*3/uL (ref 0.1–1.0)
Monocytes Relative: 5.7 % (ref 3.0–12.0)
Neutrophils Relative %: 62.4 % (ref 43.0–77.0)
Platelets: 276 10*3/uL (ref 150.0–400.0)
RDW: 13.9 % (ref 11.5–14.6)
WBC: 8.3 10*3/uL (ref 4.5–10.5)

## 2012-07-18 LAB — BASIC METABOLIC PANEL
BUN: 7 mg/dL (ref 6–23)
CO2: 33 mEq/L — ABNORMAL HIGH (ref 19–32)
Chloride: 102 mEq/L (ref 96–112)
Glucose, Bld: 93 mg/dL (ref 70–99)
Potassium: 3.9 mEq/L (ref 3.5–5.1)

## 2012-07-18 LAB — HEMOGLOBIN A1C: Hgb A1c MFr Bld: 5.3 % (ref 4.6–6.5)

## 2012-07-18 LAB — LIPID PANEL: HDL: 53.2 mg/dL (ref >39.00)

## 2012-07-18 LAB — HEPATIC FUNCTION PANEL
ALT: 8 U/L (ref 0–35)
AST: 18 U/L (ref 0–37)
Albumin: 3.2 g/dL — ABNORMAL LOW (ref 3.5–5.2)
Alkaline Phosphatase: 76 U/L (ref 39–117)

## 2012-07-18 LAB — LDL CHOLESTEROL, DIRECT: Direct LDL: 63.8 mg/dL

## 2012-07-21 ENCOUNTER — Ambulatory Visit (INDEPENDENT_AMBULATORY_CARE_PROVIDER_SITE_OTHER): Payer: Medicare Other | Admitting: Internal Medicine

## 2012-07-21 ENCOUNTER — Encounter: Payer: Self-pay | Admitting: Internal Medicine

## 2012-07-21 VITALS — BP 122/82 | HR 77 | Temp 98.2°F | Ht 66.0 in | Wt 127.5 lb

## 2012-07-21 DIAGNOSIS — R29898 Other symptoms and signs involving the musculoskeletal system: Secondary | ICD-10-CM | POA: Insufficient documentation

## 2012-07-21 DIAGNOSIS — Z0000 Encounter for general adult medical examination without abnormal findings: Secondary | ICD-10-CM

## 2012-07-21 DIAGNOSIS — M216X9 Other acquired deformities of unspecified foot: Secondary | ICD-10-CM

## 2012-07-21 DIAGNOSIS — M21371 Foot drop, right foot: Secondary | ICD-10-CM | POA: Insufficient documentation

## 2012-07-21 MED ORDER — HYDROCORTISONE BUTYRATE 0.1 % EX CREA: 1.0000 "application " | TOPICAL_CREAM | Freq: Two times a day (BID) | CUTANEOUS | Status: DC

## 2012-07-21 NOTE — Patient Instructions (Addendum)
OK to try off the lovastatin for one month, but if no improvement in the leg weakness, you should re-start the lovastatin Please continue all other medications as before, and refills have been done if requested - the cream Please keep your appointments with your specialists as you have planned - Dr Clifton Zarius Furr Please call if change your mind about the pulmonary rehab referral Please keep your appointments with your specialists as you have planned - pain management Please call your insurance to see if the shingles shot is covered, and come for Nurse Appt if it is Please consider wearing the AFO for the right foot drop to avoid further falling Thank you for enrolling in MyChart. Please follow the instructions below to securely access your online medical record. MyChart allows you to send messages to your doctor, view your test results, renew your prescriptions, schedule appointments, and more. Please return in 6 months, or sooner if needed

## 2012-07-21 NOTE — Assessment & Plan Note (Addendum)
Probable deconditioning a signficant factor, cant r/o statin related as well - ok for 1 mo trial off to assess, but to re-start if not improved, declines pulm rehab referral

## 2012-07-21 NOTE — Assessment & Plan Note (Signed)

## 2012-07-21 NOTE — Progress Notes (Signed)
Subjective:    Patient ID: Alexis Higgins, female    DOB: 1940-11-22, 72 y.o.   MRN: [id number]  HPI Here for wellness and f/u;  Overall doing ok;  Pt denies CP, worsening SOB, DOE, wheezing, orthopnea, PND, worsening LE edema, palpitations, dizziness or syncope.  Pt denies neurological change such as new headache, facial or extremity weakness.  Pt denies polydipsia, polyuria, or low sugar symptoms. Pt states overall good compliance with treatment and medications, good tolerability, and has been trying to follow lower cholesterol diet.  Pt denies worsening depressive symptoms, suicidal ideation or panic. No fever, night sweats, wt loss, loss of appetite, or other constitutional symptoms.  Pt states good ability with ADL's, has mod fall risk, home safety reviewed and adequate, no other significant changes in hearing or vision, but did have one slight fall without injury after tripped up on her oxygen line with her chronic right foot drop.  Main complaint today is bilat prox leg weakness with trying to stand up.  Has not agreed to pulm rehab in the past due to cost.   Past Medical History  Diagnosis Date  . Impaired glucose tolerance 01/07/2011  . ANXIETY 01/01/2007  . BURSITIS, RIGHT HIP 06/04/2009  . CAD (coronary artery disease)     a. BMS to LAD 2010. b. NSTEMI with DES to LAD for ISR 2011. c. Patent stent 03/2012/Imdur added.  . CHEST PAIN-PRECORDIAL 01/15/2009  . COPD 01/01/2007    a. Chronic resp failure on home O2.  Marland Kitchen DEPRESSION 01/01/2007  . GROIN PAIN 06/20/2008  . Headache 01/01/2007  . HYPERTENSION 01/01/2007  . LOW BACK PAIN 01/01/2007  . Muscle weakness (generalized) 06/04/2009  . OSTEOARTHRITIS, HIP 07/01/2008  . OSTEOPOROSIS 01/01/2007  . SYNCOPE 01/01/2007  . TRANSIENT ISCHEMIC ATTACK, HX OF 01/01/2007  . Eczema 01/08/2011  . Rosacea 01/08/2011  . Colon polyps     H/o tubular adenoma of colon  . Hemorrhoid   . Pneumonia 1998  . Cataract   . Abnormal chest x-ray     03/2012: will need  OP f/u.   Past Surgical History  Procedure Laterality Date  . Appendectomy    . Abdominal hysterectomy    . Oophorectomy      one ovary  . Sp lumbar disc surgury      Dr. Leslee Home  . S/p bilat cataract  2010  . Coronary stent placement    . Colonoscopy  01/25/2002    tubular adenoma,hemorrhoids, hyperplastic  colon polyps  . Colonoscopy  02/17/2005    hemorrhoids    reports that she quit smoking about 6 years ago. Her smoking use included Cigarettes. She has a 50 pack-year smoking history. She has never used smokeless tobacco. She reports that she does not drink alcohol or use illicit drugs. family history includes Cardiomyopathy in her sister; Colon cancer in an unspecified family member; Heart attack in her sister; Heart disease in her sister; Osteoporosis in her mother; and Seizures in her sister. Allergies  Allergen Reactions  . Aspirin Other (See Comments)     cns bleed risk  . Codeine Rash   Current Outpatient Prescriptions on File Prior to Visit  Medication Sig Dispense Refill  . albuterol (ACCUNEB) 0.63 MG/3ML nebulizer solution Take 3 mLs (0.63 mg total) by nebulization every 6 (six) hours as needed for wheezing.  75 mL  12  . amitriptyline (ELAVIL) 10 MG tablet Take 20 mg by mouth at bedtime.       Marland Kitchen aspirin EC 81  MG tablet Take 81 mg by mouth daily.      . Cholecalciferol (VITAMIN D) 1000 UNITS capsule Take 1,000 Units by mouth every evening.       . docusate sodium (COLACE) 100 MG capsule Take 100 mg by mouth daily as needed. For constipation      . isosorbide mononitrate (IMDUR) 15 mg TB24 Take 0.5 tablets (15 mg total) by mouth daily.  30 tablet  6  . lovastatin (MEVACOR) 40 MG tablet Take 20 mg by mouth every evening.      . metoprolol tartrate (LOPRESSOR) 25 MG tablet Take 0.5 tablets (12.5 mg total) by mouth 2 (two) times daily.  30 tablet  16  . nitroGLYCERIN (NITROSTAT) 0.4 MG SL tablet Place 1 tablet (0.4 mg total) under the tongue every 5 (five) minutes as  needed for chest pain (up to 3 doses).  25 tablet  4  . ondansetron (ZOFRAN) 4 MG tablet Take 4 mg by mouth 3 (three) times daily as needed. For nausea (take with tramadol)      . polyethylene glycol powder (MIRALAX) powder Take 17 g by mouth daily as needed. For constipation      . tiotropium (SPIRIVA) 18 MCG inhalation capsule Place 18 mcg into inhaler and inhale daily.      . traMADol (ULTRAM) 50 MG tablet Take 100 mg by mouth 3 (three) times daily as needed. For pain       No current facility-administered medications on file prior to visit.   Review of Systems Constitutional: Negative for diaphoresis, activity change, appetite change or unexpected weight change.  HENT: Negative for hearing loss, ear pain, facial swelling, mouth sores and neck stiffness.   Eyes: Negative for pain, redness and visual disturbance.  Respiratory: Negative for shortness of breath and wheezing.   Cardiovascular: Negative for chest pain and palpitations.  Gastrointestinal: Negative for diarrhea, blood in stool, abdominal distention or other pain Genitourinary: Negative for hematuria, flank pain or change in urine volume.  Musculoskeletal: Negative for myalgias and joint swelling.  Skin: Negative for color change and wound.  Neurological: Negative for syncope and numbness. other than noted Hematological: Negative for adenopathy.  Psychiatric/Behavioral: Negative for hallucinations, self-injury, decreased concentration and agitation.      Objective:   Physical Exam BP 122/82  Pulse 77  Temp(Src) 98.2 F (36.8 C) (Oral)  Ht 5\' 6"  (1.676 m)  Wt 127 lb 8 oz (57.834 kg)  BMI 20.59 kg/m2  SpO2 96% VS noted, has port o2 Constitutional: Pt is oriented to person, place, and time. Appears thin Head: Normocephalic and atraumatic.  Right Ear: External ear normal.  Left Ear: External ear normal.  Nose: Nose normal.  Mouth/Throat: Oropharynx is clear and moist.  Eyes: Conjunctivae and EOM are normal. Pupils are  equal, round, and reactive to light.  Neck: Normal range of motion. Neck supple. No JVD present. No tracheal deviation present.  Cardiovascular: Normal rate, regular rhythm, normal heart sounds and intact distal pulses.   Pulmonary/Chest: Effort normal and breath sounds bilat decreased, no rales or wheezing.  Abdominal: Soft. Bowel sounds are normal. There is no tenderness. No HSM  Musculoskeletal: Normal range of motion. Exhibits no edema.  Lymphadenopathy:  Has no cervical adenopathy.  Neurological: Pt is alert and oriented to person, place, and time. Pt has normal reflexes. No cranial nerve deficit. Motor/dtr intact except for right foot drop Skin: Skin is warm and dry. No rash noted.  Psychiatric:  Has  normal mood and affect.  Behavior is normal.     Assessment & Plan:

## 2012-07-21 NOTE — Assessment & Plan Note (Signed)
Has an AFO at home, not using, encouraged to use to avoid falling

## 2012-07-24 ENCOUNTER — Ambulatory Visit: Payer: Medicare Other | Admitting: Cardiovascular Disease

## 2012-07-26 ENCOUNTER — Encounter: Payer: Self-pay | Admitting: Internal Medicine

## 2012-07-26 ENCOUNTER — Encounter: Payer: Self-pay | Admitting: Cardiovascular Disease

## 2012-07-26 ENCOUNTER — Ambulatory Visit (INDEPENDENT_AMBULATORY_CARE_PROVIDER_SITE_OTHER): Payer: Medicare Other | Admitting: Cardiovascular Disease

## 2012-07-26 VITALS — BP 132/77 | HR 70 | Ht 66.0 in | Wt 127.0 lb

## 2012-07-26 DIAGNOSIS — I2510 Atherosclerotic heart disease of native coronary artery without angina pectoris: Secondary | ICD-10-CM

## 2012-07-26 NOTE — Progress Notes (Signed)
History of Present Illness: 72 yo female with history of CAD, PAD, HTN, COPD who is here today for cardiac follow up. She has a hx of CAD, s/p BMS to LAD in 8/10, mild PAD (right CFA by doppler in 9/10), DES to LAD for ISR 2011. She was admitted to [facility] 11/20-11/21/13 with left shoulder pain. This was reminiscent of her previous angina. Cardiac markers were negative. LHC 04/13/12: pLAD stent patent, pRCA 30%, mRCA 10%, EF 60%. Medical therapy was recommended. She was placed on isosorbide. Chest x-ray demonstrated questionable left upper lobe nodule. Outpatient CT chest January 2014 (below) with no worrisome masses or nodules.  She is doing well. Dyspnea is stable. No chest pain. No orthopnea, PND, edema. No syncope.   Primary Care Physician: Oliver Barre  Last Lipid Profile:Lipid Panel     Component Value Date/Time   CHOL 160 07/18/2012 1418   TRIG 271.0* 07/18/2012 1418   HDL 53.20 07/18/2012 1418   CHOLHDL 3 07/18/2012 1418   VLDL 54.2* 07/18/2012 1418   LDLCALC 66 04/13/2012 0211     Past Medical History  Diagnosis Date  . Impaired glucose tolerance 01/07/2011  . ANXIETY 01/01/2007  . BURSITIS, RIGHT HIP 06/04/2009  . CAD (coronary artery disease)     a. BMS to LAD 2010. b. NSTEMI with DES to LAD for ISR 2011. c. Patent stent 03/2012/Imdur added.  . CHEST PAIN-PRECORDIAL 01/15/2009  . COPD 01/01/2007    a. Chronic resp failure on home O2.  Marland Kitchen DEPRESSION 01/01/2007  . GROIN PAIN 06/20/2008  . Headache 01/01/2007  . HYPERTENSION 01/01/2007  . LOW BACK PAIN 01/01/2007  . Muscle weakness (generalized) 06/04/2009  . OSTEOARTHRITIS, HIP 07/01/2008  . OSTEOPOROSIS 01/01/2007  . SYNCOPE 01/01/2007  . TRANSIENT ISCHEMIC ATTACK, HX OF 01/01/2007  . Eczema 01/08/2011  . Rosacea 01/08/2011  . Colon polyps     H/o tubular adenoma of colon  . Hemorrhoid   . Pneumonia 1998  . Cataract   . Abnormal chest x-ray     03/2012: will need OP f/u.    Past Surgical History  Procedure Laterality Date  .  Appendectomy    . Abdominal hysterectomy    . Oophorectomy      one ovary  . Sp lumbar disc surgury      Dr. Leslee Home  . S/p bilat cataract  2010  . Coronary stent placement    . Colonoscopy  01/25/2002    tubular adenoma,hemorrhoids, hyperplastic  colon polyps  . Colonoscopy  02/17/2005    hemorrhoids    Current Outpatient Prescriptions  Medication Sig Dispense Refill  . albuterol (ACCUNEB) 0.63 MG/3ML nebulizer solution Take 3 mLs (0.63 mg total) by nebulization every 6 (six) hours as needed for wheezing.  75 mL  12  . amitriptyline (ELAVIL) 10 MG tablet Take 20 mg by mouth at bedtime.       Marland Kitchen aspirin EC 81 MG tablet Take 81 mg by mouth daily.      . Cholecalciferol (VITAMIN D) 1000 UNITS capsule Take 1,000 Units by mouth every evening.       . docusate sodium (COLACE) 100 MG capsule Take 100 mg by mouth daily as needed. For constipation      . hydrocortisone butyrate (LUCOID) 0.1 % CREA cream Apply 1 application topically 2 (two) times daily.  45 g  1  . isosorbide mononitrate (IMDUR) 15 mg TB24 Take 0.5 tablets (15 mg total) by mouth daily.  30 tablet  6  .  lovastatin (MEVACOR) 40 MG tablet ON HOLD FOR 30 DAYS DUE TO MUSCLE PAIN 3-5 2014      . metoprolol tartrate (LOPRESSOR) 25 MG tablet Take 0.5 tablets (12.5 mg total) by mouth 2 (two) times daily.  30 tablet  16  . nitroGLYCERIN (NITROSTAT) 0.4 MG SL tablet Place 1 tablet (0.4 mg total) under the tongue every 5 (five) minutes as needed for chest pain (up to 3 doses).  25 tablet  4  . ondansetron (ZOFRAN) 4 MG tablet Take 4 mg by mouth 3 (three) times daily as needed. For nausea (take with tramadol)      . polyethylene glycol powder (MIRALAX) powder Take 17 g by mouth daily as needed. For constipation      . tiotropium (SPIRIVA) 18 MCG inhalation capsule Place 18 mcg into inhaler and inhale daily.      . traMADol (ULTRAM) 50 MG tablet Take 100 mg by mouth 3 (three) times daily as needed. For pain       No current  facility-administered medications for this visit.    Allergies  Allergen Reactions  . Aspirin Other (See Comments)     cns bleed risk  . Codeine Rash    History   Social History  . Marital Status: Married    Spouse Name: N/A    Number of Children: 4  . Years of Education: N/A   Occupational History  . disabled back since 2003    Social History Main Topics  . Smoking status: Former Smoker -- 1.00 packs/day for 50 years    Types: Cigarettes    Quit date: 07/24/2005  . Smokeless tobacco: Never Used  . Alcohol Use: No  . Drug Use: No  . Sexually Active: Not on file   Other Topics Concern  . Not on file   Social History Narrative  . No narrative on file    Family History  Problem Relation Age of Onset  . Osteoporosis Mother   . Heart disease Sister   . Colon cancer    . Seizures Sister     epilepsy  . Cardiomyopathy Sister   . Heart attack Sister     Review of Systems:  As stated in the HPI and otherwise negative.   BP 132/77  Pulse 70  Ht 5\' 6"  (1.676 m)  Wt 127 lb (57.607 kg)  BMI 20.51 kg/m2  SpO2 98%  Physical Examination: General: Well developed, well nourished, NAD HEENT: OP clear, mucus membranes moist SKIN: warm, dry. No rashes. Neuro: No focal deficits Musculoskeletal: Muscle strength 5/5 all ext Psychiatric: Mood and affect normal Neck: No JVD, no carotid bruits, no thyromegaly, no lymphadenopathy. Lungs:Clear bilaterally, no wheezes, rhonci, crackles Cardiovascular: Regular rate and rhythm. No murmurs, gallops or rubs. Abdomen:Soft. Bowel sounds present. Non-tender.  Extremities: No lower extremity edema. Pulses are 2 + in the bilateral DP/PT.  Chest CT: 06/01/12:  1. No suspicious pulmonary nodule in the left upper lobe to correspond to the plain film abnormality which likely represented a summation of shadows. 2. Stable small pulmonary nodules in the right middle lobe. 3. Central lobular emphysema again demonstrated. 4. Coronary  calcifications.  Assessment and Plan:   1. Coronary Artery Disease: No further angina. Tolerating Imdur. Continue current Rx with ASA,beta blocker and nitrates. Her statin is on hold for leg weakness.   2. Hyperlipidemia: Controlled. Will restart statin in one month if leg weakness does not resolve.   3. Hypertension: Controlled. Continue current therapy.   4. Lung  Nodule: Chest CT without any worrisome masses.

## 2012-07-26 NOTE — Patient Instructions (Addendum)
Your physician wants you to follow-up in:  12 months.  You will receive a reminder letter in the mail two months in advance. If you don't receive a letter, please call our office to schedule the follow-up appointment.   

## 2012-08-07 ENCOUNTER — Other Ambulatory Visit: Payer: Self-pay | Admitting: Internal Medicine

## 2012-08-22 ENCOUNTER — Encounter: Payer: Self-pay | Admitting: Internal Medicine

## 2012-08-23 ENCOUNTER — Telehealth: Payer: Self-pay

## 2012-08-23 NOTE — Telephone Encounter (Signed)
i

## 2012-08-23 NOTE — Telephone Encounter (Signed)
A user error has taken place: encounter opened in error, closed for administrative reasons.

## 2012-08-23 NOTE — Telephone Encounter (Signed)
The patient discontinued (07/22/12) lovastatin due to leg weakness. Leg weakness did not improve so started back on 08/22/12 lovastatin.  Advise please

## 2012-08-23 NOTE — Telephone Encounter (Signed)
Will forward information to MD

## 2012-08-23 NOTE — Telephone Encounter (Signed)
Med list updated

## 2012-08-23 NOTE — Telephone Encounter (Signed)
Ok, please make this is on her med list

## 2012-08-25 ENCOUNTER — Ambulatory Visit (INDEPENDENT_AMBULATORY_CARE_PROVIDER_SITE_OTHER): Payer: Medicare Other | Admitting: Internal Medicine

## 2012-08-25 ENCOUNTER — Encounter: Payer: Self-pay | Admitting: Internal Medicine

## 2012-08-25 VITALS — BP 132/68 | HR 91 | Temp 97.8°F | Ht 66.0 in | Wt 126.4 lb

## 2012-08-25 DIAGNOSIS — J9610 Chronic respiratory failure, unspecified whether with hypoxia or hypercapnia: Secondary | ICD-10-CM

## 2012-08-25 DIAGNOSIS — J4489 Other specified chronic obstructive pulmonary disease: Secondary | ICD-10-CM

## 2012-08-25 DIAGNOSIS — J449 Chronic obstructive pulmonary disease, unspecified: Secondary | ICD-10-CM

## 2012-08-25 NOTE — Patient Instructions (Addendum)
I still strongly recommend you do rehab  You will need to learn to pace yourself to a lower level of activity  Please schedule a follow up visit in 3 months but call sooner if needed pft's

## 2012-08-25 NOTE — Progress Notes (Signed)
Subjective:    Patient ID: Alexis Higgins, female    DOB: [birth year]  MRN: [id number]    Brief patient profile:  72 yowf quit smoking in 2007 with dx of bronchitis 100% better and did not require much x for occ rescue meds then then placed on spirva for sob around 2010 and much worse sob spring of 2013 so referred to pulmonary clinic 12/31/2011 by Dr Jonny Ruiz.   12/31/2011 1st pulmonary eval cc doe x across the yard even on 02.   No unusual cough, purulent sputum or sinus/hb symptoms on present rx.  No variability.   Added advair already, no better saba better but can't tol shaking so got xopenex but hasn't used it Takes spiriva each pm >>stopped  lisinopril and advair, rx spiriva   01/14/12 Follow up  Returns for follow up .  Last visit ACE was stopped. Advair changed to Spiriva .  Feels some better . Dyspnea is some better.  Still wears out with walking. Uses demand setting with walking -drops in low 90s on demand setting On continuous flow feels like gets more air.   No wheezing, chest tightness, or cough.    No fever or edema.  rec Use continuous flow O2 at 2L/m with walking  Continue on current regimen.   02/11/2012 f/u ov/Janelie Goltz cc much better attributed to using 02 , able to do half a [employer] on 02 continuous flow, using spriva rare xopenex cause makes her shake. rec Rehab > declined    08/25/2012 f/u ov/Katlyn Muldrew cc  Chief Complaint  Patient presents with  . Follow-up    Pt. reports breathing is doing better, she does complain of sob mostly when doing activities.  Pt. denies any wheezing, chest tightness or cough.     [employer] store on 02 s stopping on 2lpm Unloading dishwasher, vacuuming, sweeping still challenging even on 02  No obvious daytime variabilty or assoc chronic cough or cp or chest tightness, subjective wheeze overt sinus or hb symptoms. No unusual exp hx  Sleeping ok without nocturnal  or early am exacerbation  of respiratory  c/o's or need for noct saba. Also  denies any obvious fluctuation of symptoms with weather or environmental changes or other aggravating or alleviating factors except as outlined above.  Not using much saba because it makes her shakes more than helps her breath  ROS  The following are not active complaints unless bolded sore throat, dysphagia, dental problems, itching, sneezing,  nasal congestion or excess/ purulent secretions, ear ache,   fever, chills, sweats, unintended wt loss, pleuritic or exertional cp, hemoptysis,  orthopnea pnd or leg swelling, presyncope, palpitations, heartburn, abdominal pain, anorexia, nausea, vomiting, diarrhea  or change in bowel or urinary habits, change in stools or urine, dysuria,hematuria,  rash, arthralgias, visual complaints, headache, numbness weakness or ataxia or problems with walking or coordination,  change in mood/affect or memory.                Objective:   Physical Exam  Elderly wf nad on 02 HEENT mild turbinate edema.  Edentulous with dentures in place. Oropharynx no thrush or excess pnd or cobblestoning.  No JVD or cervical adenopathy. Mild accessory muscle hypertrophy. Trachea midline, nl thryroid. Chest was hyperinflated by percussion with diminished breath sounds  Regular rate and rhythm without murmur gallop or rub or increase P2 or edema.  Abd: no hsm, nl excursion. Ext warm without cyanosis or clubbing.    11/22/11 CXR  Changes of COPD with  minimal chronic blunting of the posterior left  costophrenic angle question related to pleural fluid or thickening.  No acute abnormalities.     Assessment & Plan:

## 2012-08-26 NOTE — Assessment & Plan Note (Signed)
-   already on 02 2lpm @ first pulmonary eval 12/2011 - 08/25/2012  Walked 2lpm pulsed x 2 laps briskly  @ 185 ft each stopped due to  Sob but sats still 89%  Needs to learn pacing skills, no need to use higher level of 02

## 2012-08-26 NOTE — Assessment & Plan Note (Addendum)
-   PFTs 12/02/11  FEV1  0.93(42%) ratio 42 and DLC039%   - try off acei 01/01/2012    - Referred to rehab 02/11/12 > declined  She previously tol advair poorly and doesn't like saba so not much more to offer at this point x rehab/ pacing.    Each maintenance medication was reviewed in detail including most importantly the difference between maintenance and as needed and under what circumstances the prns are to be used.  Please see instructions for details which were reviewed in writing and the patient given a copy.

## 2012-11-17 ENCOUNTER — Other Ambulatory Visit: Payer: Self-pay | Admitting: *Deleted

## 2012-11-17 MED ORDER — ISOSORBIDE MONONITRATE ER 30 MG PO TB24: 15.0000 mg | ORAL_TABLET | Freq: Every day | ORAL | Status: DC

## 2012-12-27 ENCOUNTER — Encounter: Payer: Self-pay | Admitting: Internal Medicine

## 2012-12-27 ENCOUNTER — Ambulatory Visit (INDEPENDENT_AMBULATORY_CARE_PROVIDER_SITE_OTHER): Payer: Medicare Other | Admitting: Internal Medicine

## 2012-12-27 ENCOUNTER — Other Ambulatory Visit: Payer: Medicare Other

## 2012-12-27 VITALS — BP 102/60 | HR 94 | Temp 98.8°F | Ht 66.0 in | Wt 123.0 lb

## 2012-12-27 DIAGNOSIS — R0600 Dyspnea, unspecified: Secondary | ICD-10-CM

## 2012-12-27 DIAGNOSIS — J9610 Chronic respiratory failure, unspecified whether with hypoxia or hypercapnia: Secondary | ICD-10-CM

## 2012-12-27 DIAGNOSIS — J449 Chronic obstructive pulmonary disease, unspecified: Secondary | ICD-10-CM

## 2012-12-27 DIAGNOSIS — R0989 Other specified symptoms and signs involving the circulatory and respiratory systems: Secondary | ICD-10-CM

## 2012-12-27 LAB — PULMONARY FUNCTION TEST

## 2012-12-27 MED ORDER — BUDESONIDE-FORMOTEROL FUMARATE 160-4.5 MCG/ACT IN AERO: INHALATION_SPRAY | RESPIRATORY_TRACT | Status: DC

## 2012-12-27 NOTE — Progress Notes (Signed)
PFT done. Jennifer Castillo, CMA  

## 2012-12-27 NOTE — Progress Notes (Signed)
Subjective:    Patient ID: Alexis Higgins, female    DOB: [birth year]  MRN: [id number]    Brief patient profile:  72 yowf quit smoking in 2007 with dx of bronchitis 100% better and did not require much x for occ rescue meds then then placed on spirva for sob around 2010 and much worse sob spring of 2013 so referred to pulmonary clinic 12/31/2011 by Dr Jonny Ruiz.   12/31/2011 1st pulmonary eval cc doe x across the yard even on 02.   No unusual cough, purulent sputum or sinus/hb symptoms on present rx.  No variability.   Added advair already, no better saba better but can't tol shaking so got xopenex but hasn't used it Takes spiriva each pm >>stopped  lisinopril and advair, rx spiriva   01/14/12 Follow up  Returns for follow up .  Last visit ACE was stopped. Advair changed to Spiriva .  Feels some better . Dyspnea is some better.  Still wears out with walking. Uses demand setting with walking -drops in low 90s on demand setting On continuous flow feels like gets more air.   No wheezing, chest tightness, or cough.    No fever or edema.  rec Use continuous flow O2 at 2L/m with walking  Continue on current regimen.   02/11/2012 f/u ov/Edrian Melucci cc much better attributed to using 02 , able to do half a [employer] on 02 continuous flow, using spriva rare xopenex cause makes her shake. rec Rehab > declined    08/25/2012 f/u ov/Tremel Setters cc  Chief Complaint  Patient presents with  . Follow-up    Pt. reports breathing is doing better, she does complain of sob mostly when doing activities.  Pt. denies any wheezing, chest tightness or cough.     [employer] on 02 s stopping on 2lpm Unloading dishwasher, vacuuming, sweeping still challenging even on 02   12/27/2012 f/u ov/Analina Filla  Re GOLD III COPD/ 02 2lpm 24/7  Chief Complaint  Patient presents with  . Followup with PFT's    Pt states breathing unchanged since the last visit. She states unable to attend rehab due to cost and transportation issues. No  new co's today.    last trip to [employer] couldn't make it up and down the aisles even on 02, saba doesn't help much to improve activity tol, miniimal prn use  No obvious daytime variabilty or assoc chronic cough or cp or chest tightness, subjective wheeze overt sinus or hb symptoms. No unusual exp hx  Sleeping ok without nocturnal  or early am exacerbation  of respiratory  c/o's or need for noct saba. Also denies any obvious fluctuation of symptoms with weather or environmental changes or other aggravating or alleviating factors except as outlined above.  Not using much saba because it makes her shakes more than helps her breath  ROS  The following are not active complaints unless bolded sore throat, dysphagia, dental problems, itching, sneezing,  nasal congestion or excess/ purulent secretions, ear ache,   fever, chills, sweats, unintended wt loss, pleuritic or exertional cp, hemoptysis,  orthopnea pnd or leg swelling, presyncope, palpitations, heartburn, abdominal pain, anorexia, nausea, vomiting, diarrhea  or change in bowel or urinary habits, change in stools or urine, dysuria,hematuria,  rash, arthralgias, visual complaints, headache, numbness weakness or ataxia or problems with walking or coordination,  change in mood/affect or memory.                Objective:   Physical Exam  Elderly wf  nad on 02 Wt Readings from Last 3 Encounters:  12/27/12 123 lb (55.792 kg)  08/25/12 126 lb 6.4 oz (57.335 kg)  07/26/12 127 lb (57.607 kg)     HEENT mild turbinate edema.  Edentulous with dentures in place. Oropharynx no thrush or excess pnd or cobblestoning.  No JVD or cervical adenopathy. Mild accessory muscle hypertrophy. Trachea midline, nl thryroid. Chest was hyperinflated by percussion with diminished breath sounds  Regular rate and rhythm without murmur gallop or rub or increase P2 or edema.  Abd: no hsm, nl excursion. Ext warm without cyanosis or clubbing.    CT chest 06/01/12 1. No  suspicious pulmonary nodule in the left upper lobe to  correspond to the plain film abnormality which likely represented a  summation of shadows.  2. Stable small pulmonary nodules in the right middle lobe.  3. Central lobular emphysema again demonstrated.  4. Coronary calcifications. .     Assessment & Plan:

## 2012-12-27 NOTE — Patient Instructions (Addendum)
Symbicort 160 Take 2 puffs first thing in am and then another 2 puffs about 12 hours later.   Work on inhaler technique:  relax and gently blow all the way out then take a nice smooth deep breath back in, triggering the inhaler at same time you start breathing in.  Hold for up to 5 seconds if you can.  Rinse and gargle with water when done  Continue spiriva each am   Only use your albuterol (ventolin 1st line, nebulizer is the backup to ventolin) as a rescue medication to be used if you can't catch your breath by resting or doing a relaxed purse lip breathing pattern. The less you use it, the better it will work when you need it.   Please remember to go to the lab   department downstairs for your tests - we will call you with the results when they are available.     Please schedule a follow up visit in 3 months but call sooner if needed

## 2012-12-29 LAB — ALPHA-1-ANTITRYPSIN: A-1 Antitrypsin, Ser: 162 mg/dL (ref 90–200)

## 2012-12-29 NOTE — Progress Notes (Signed)
Quick Note:  Spoke with pt and notified of results per Dr. Wert. Pt verbalized understanding and denied any questions.  ______ 

## 2012-12-29 NOTE — Assessment & Plan Note (Addendum)
-   PFTs 12/02/11  FEV1  0.93(42%) ratio 42 and DLC039%   - PFT's 12/27/2012  FEV1  0.69 (32%) ratio 42 and 21% better p B2    - try off acei 01/01/2012    - Referred to rehab 02/11/12 > declined   - alpha one AT 12/27/2012 >> level 160   The proper method of use, as well as anticipated side effects, of a metered-dose inhaler are discussed and demonstrated to the patient. Improved effectiveness after extensive coaching during this visit to a level of approximately  75%   Really nothing else to offer x rehab and she declines    Each maintenance medication was reviewed in detail including most importantly the difference between maintenance and as needed and under what circumstances the prns are to be used.  Please see instructions for details which were reviewed in writing and the patient given a copy.

## 2013-01-05 ENCOUNTER — Encounter: Payer: Self-pay | Admitting: Internal Medicine

## 2013-01-11 ENCOUNTER — Encounter: Payer: Self-pay | Admitting: Internal Medicine

## 2013-01-12 MED ORDER — DOXYCYCLINE HYCLATE 100 MG PO TABS: 100.0000 mg | ORAL_TABLET | Freq: Two times a day (BID) | ORAL | Status: DC

## 2013-01-12 NOTE — Telephone Encounter (Signed)
Done erx 

## 2013-01-16 ENCOUNTER — Encounter: Payer: Self-pay | Admitting: Internal Medicine

## 2013-01-16 MED ORDER — LOVASTATIN 20 MG PO TABS: 20.0000 mg | ORAL_TABLET | Freq: Every day | ORAL | Status: DC

## 2013-01-18 ENCOUNTER — Ambulatory Visit: Payer: Medicare Other | Admitting: Internal Medicine

## 2013-01-31 ENCOUNTER — Encounter: Payer: Self-pay | Admitting: Internal Medicine

## 2013-01-31 ENCOUNTER — Ambulatory Visit (INDEPENDENT_AMBULATORY_CARE_PROVIDER_SITE_OTHER): Payer: Medicare Other | Admitting: Internal Medicine

## 2013-01-31 VITALS — BP 110/60 | HR 72 | Temp 98.8°F | Ht 66.0 in | Wt 123.0 lb

## 2013-01-31 DIAGNOSIS — I10 Essential (primary) hypertension: Secondary | ICD-10-CM

## 2013-01-31 DIAGNOSIS — Z23 Encounter for immunization: Secondary | ICD-10-CM

## 2013-01-31 DIAGNOSIS — Z0000 Encounter for general adult medical examination without abnormal findings: Secondary | ICD-10-CM

## 2013-01-31 DIAGNOSIS — R7309 Other abnormal glucose: Secondary | ICD-10-CM

## 2013-01-31 DIAGNOSIS — E785 Hyperlipidemia, unspecified: Secondary | ICD-10-CM

## 2013-01-31 DIAGNOSIS — R7302 Impaired glucose tolerance (oral): Secondary | ICD-10-CM

## 2013-01-31 NOTE — Patient Instructions (Signed)
There are no changes today Please continue all other medications as before, and refills have been done if requested. Please have the pharmacy call with any other refills you may need.  Please keep your appointments with your specialists as you may have planned  Please return in 6 months, or sooner if needed, with Lab testing done 3-5 days before

## 2013-01-31 NOTE — Progress Notes (Signed)
Subjective:    Patient ID: Alexis Higgins, female    DOB: [birth year], 72 y.o.   MRN: [id number]  HPI    Here to f/u; overall doing ok,  Pt denies chest pain, increased sob or doe, wheezing, orthopnea, PND, increased LE swelling, palpitations, dizziness or syncope.  Pt denies polydipsia, polyuria, or low sugar symptoms such as weakness or confusion improved with po intake.  Pt denies new neurological symptoms such as new headache, or facial or extremity weakness or numbness.   Pt states overall good compliance with meds, has been trying to follow lower cholesterol diet, with wt overall stable,  but little exercise however.  Tx per derm with cleocin gel but required doxy course, now resolved.  Past Medical History  Diagnosis Date  . Impaired glucose tolerance 01/07/2011  . ANXIETY 01/01/2007  . BURSITIS, RIGHT HIP 06/04/2009  . CAD (coronary artery disease)     a. BMS to LAD 2010. b. NSTEMI with DES to LAD for ISR 2011. c. Patent stent 03/2012/Imdur added.  . CHEST PAIN-PRECORDIAL 01/15/2009  . COPD 01/01/2007    a. Chronic resp failure on home O2.  Marland Kitchen DEPRESSION 01/01/2007  . GROIN PAIN 06/20/2008  . Headache(784.0) 01/01/2007  . HYPERTENSION 01/01/2007  . LOW BACK PAIN 01/01/2007  . Muscle weakness (generalized) 06/04/2009  . OSTEOARTHRITIS, HIP 07/01/2008  . OSTEOPOROSIS 01/01/2007  . SYNCOPE 01/01/2007  . TRANSIENT ISCHEMIC ATTACK, HX OF 01/01/2007  . Eczema 01/08/2011  . Rosacea 01/08/2011  . Colon polyps     H/o tubular adenoma of colon  . Hemorrhoid   . Pneumonia 1998  . Cataract   . Abnormal chest x-ray     03/2012: will need OP f/u.   Past Surgical History  Procedure Laterality Date  . Appendectomy    . Abdominal hysterectomy    . Oophorectomy      one ovary  . Sp lumbar disc surgury      Dr. Leslee Home  . S/p bilat cataract  2010  . Coronary stent placement    . Colonoscopy  01/25/2002    tubular adenoma,hemorrhoids, hyperplastic  colon polyps  . Colonoscopy  02/17/2005   hemorrhoids    reports that she quit smoking about 7 years ago. Her smoking use included Cigarettes. She has a 50 pack-year smoking history. She has never used smokeless tobacco. She reports that she does not drink alcohol or use illicit drugs. family history includes Cardiomyopathy in her sister; Colon cancer in an other family member; Heart attack in her sister; Heart disease in her sister; Osteoporosis in her mother; Seizures in her sister. Allergies  Allergen Reactions  . Aspirin Other (See Comments)     cns bleed risk  . Codeine Rash   Current Outpatient Prescriptions on File Prior to Visit  Medication Sig Dispense Refill  . albuterol (ACCUNEB) 0.63 MG/3ML nebulizer solution Take 3 mLs (0.63 mg total) by nebulization every 6 (six) hours as needed for wheezing.  75 mL  12  . amitriptyline (ELAVIL) 10 MG tablet Take 20 mg by mouth at bedtime.       Marland Kitchen aspirin EC 81 MG tablet Take 81 mg by mouth daily.      . budesonide-formoterol (SYMBICORT) 160-4.5 MCG/ACT inhaler Take 2 puffs first thing in am and then another 2 puffs about 12 hours later.  1 Inhaler  12  . Cholecalciferol (VITAMIN D) 1000 UNITS capsule Take 1,000 Units by mouth every evening.       . docusate sodium (COLACE)  100 MG capsule Take 100 mg by mouth daily as needed. For constipation      . doxycycline (VIBRA-TABS) 100 MG tablet Take 1 tablet (100 mg total) by mouth 2 (two) times daily.  20 tablet  0  . hydrocortisone butyrate (LUCOID) 0.1 % CREA cream Apply 1 application topically 2 (two) times daily.  45 g  1  . isosorbide mononitrate (IMDUR) 30 MG 24 hr tablet Take 0.5 tablets (15 mg total) by mouth daily.  15 tablet  6  . lovastatin (MEVACOR) 20 MG tablet Take 1 tablet (20 mg total) by mouth at bedtime.  90 tablet  3  . metoprolol tartrate (LOPRESSOR) 25 MG tablet Take 0.5 tablets (12.5 mg total) by mouth 2 (two) times daily.  30 tablet  16  . nitroGLYCERIN (NITROSTAT) 0.4 MG SL tablet Place 1 tablet (0.4 mg total) under the  tongue every 5 (five) minutes as needed for chest pain (up to 3 doses).  25 tablet  4  . ondansetron (ZOFRAN) 4 MG tablet Take 4 mg by mouth 3 (three) times daily as needed. For nausea (take with tramadol)      . polyethylene glycol powder (MIRALAX) powder Take 17 g by mouth daily as needed. For constipation      . tiotropium (SPIRIVA) 18 MCG inhalation capsule Place 18 mcg into inhaler and inhale daily.      . traMADol (ULTRAM) 50 MG tablet Take 100 mg by mouth 3 (three) times daily as needed. For pain       No current facility-administered medications on file prior to visit.      Review of Systems  Constitutional: Negative for unexpected weight change, or unusual diaphoresis  HENT: Negative for tinnitus.   Eyes: Negative for photophobia and visual disturbance.  Respiratory: Negative for choking and stridor.   Gastrointestinal: Negative for vomiting and blood in stool.  Genitourinary: Negative for hematuria and decreased urine volume.  Musculoskeletal: Negative for acute joint swelling Skin: Negative for color change and wound.  Neurological: Negative for tremors and numbness other than noted  Psychiatric/Behavioral: Negative for decreased concentration or  hyperactivity.       Objective:   Physical Exam BP 110/60  Pulse 72  Temp(Src) 98.8 F (37.1 C) (Oral)  Ht 5\' 6"  (1.676 m)  Wt 123 lb (55.792 kg)  BMI 19.86 kg/m2  SpO2 93% VS noted,  Constitutional: Pt appears well-developed and well-nourished.  HENT: Head: NCAT.  Right Ear: External ear normal.  Left Ear: External ear normal.  Eyes: Conjunctivae and EOM are normal. Pupils are equal, round, and reactive to light.  Neck: Normal range of motion. Neck supple.  Cardiovascular: Normal rate and regular rhythm.   Pulmonary/Chest: Effort normal and breath sounds decreased, no rales.  Abd:  Soft, NT, non-distended, + BS Neurological: Pt is alert. Not confused  Skin: Skin is warm. No erythema.  Psychiatric: Pt behavior is  normal. Thought content normal.     Assessment & Plan:

## 2013-02-03 NOTE — Assessment & Plan Note (Signed)
stable overall by history and exam, recent data reviewed with pt, and pt to continue medical treatment as before,  to f/u any worsening symptoms or concerns BP Readings from Last 3 Encounters:  01/31/13 110/60  12/27/12 102/60  08/25/12 132/68

## 2013-02-03 NOTE — Assessment & Plan Note (Signed)
stable overall by history and exam, recent data reviewed with pt, and pt to continue medical treatment as before,  to f/u any worsening symptoms or concerns Lab Results  Component Value Date   LDLCALC 66 04/13/2012

## 2013-02-03 NOTE — Assessment & Plan Note (Signed)
stable overall by history and exam, recent data reviewed with pt, and pt to continue medical treatment as before,  to f/u any worsening symptoms or concerns Lab Results  Component Value Date   HGBA1C 5.3 07/18/2012

## 2013-03-26 ENCOUNTER — Telehealth: Payer: Self-pay | Admitting: Internal Medicine

## 2013-03-26 NOTE — Telephone Encounter (Signed)
Called Patient to set up follow up apt, Left message x3. No return call back. Sent letter 03/26/13  ° °

## 2013-04-11 ENCOUNTER — Other Ambulatory Visit: Payer: Self-pay | Admitting: Internal Medicine

## 2013-04-11 DIAGNOSIS — Z1231 Encounter for screening mammogram for malignant neoplasm of breast: Secondary | ICD-10-CM

## 2013-05-03 ENCOUNTER — Ambulatory Visit (HOSPITAL_COMMUNITY)
Admission: RE | Admit: 2013-05-03 | Discharge: 2013-05-03 | Disposition: A | Discharge status: Home / Self Care | Payer: Medicare Other | Source: Ambulatory Visit | Attending: Internal Medicine | Admitting: Internal Medicine

## 2013-05-03 DIAGNOSIS — Z1231 Encounter for screening mammogram for malignant neoplasm of breast: Secondary | ICD-10-CM | POA: Insufficient documentation

## 2013-05-19 ENCOUNTER — Other Ambulatory Visit: Payer: Self-pay | Admitting: Internal Medicine

## 2013-05-19 ENCOUNTER — Encounter: Payer: Self-pay | Admitting: Internal Medicine

## 2013-05-22 ENCOUNTER — Encounter: Payer: Self-pay | Admitting: Internal Medicine

## 2013-05-22 MED ORDER — DOXYCYCLINE HYCLATE 100 MG PO TABS: 100.0000 mg | ORAL_TABLET | Freq: Two times a day (BID) | ORAL | Status: DC

## 2013-05-23 LAB — HM MAMMOGRAPHY: HM Mammogram: NORMAL

## 2013-05-24 DIAGNOSIS — Z5189 Encounter for other specified aftercare: Secondary | ICD-10-CM

## 2013-05-24 HISTORY — DX: Encounter for other specified aftercare: Z51.89

## 2013-07-13 ENCOUNTER — Other Ambulatory Visit: Payer: Self-pay | Admitting: Cardiovascular Disease

## 2013-07-15 ENCOUNTER — Other Ambulatory Visit: Payer: Self-pay | Admitting: Cardiovascular Disease

## 2013-07-16 ENCOUNTER — Other Ambulatory Visit: Payer: Self-pay

## 2013-07-27 ENCOUNTER — Ambulatory Visit (INDEPENDENT_AMBULATORY_CARE_PROVIDER_SITE_OTHER): Payer: Medicare Other | Admitting: Cardiovascular Disease

## 2013-07-27 ENCOUNTER — Encounter: Payer: Self-pay | Admitting: Cardiovascular Disease

## 2013-07-27 VITALS — BP 122/68 | HR 95 | Ht 66.0 in | Wt 116.2 lb

## 2013-07-27 DIAGNOSIS — E782 Mixed hyperlipidemia: Secondary | ICD-10-CM

## 2013-07-27 DIAGNOSIS — I10 Essential (primary) hypertension: Secondary | ICD-10-CM

## 2013-07-27 DIAGNOSIS — I2510 Atherosclerotic heart disease of native coronary artery without angina pectoris: Secondary | ICD-10-CM

## 2013-07-27 MED ORDER — CLOPIDOGREL BISULFATE 75 MG PO TABS
75.0000 mg | ORAL_TABLET | Freq: Every day | ORAL | Status: DC
Start: 2013-07-27 — End: 2013-10-24

## 2013-07-27 NOTE — Progress Notes (Signed)
History of Present Illness: 73 yo female with history of CAD, PAD, HTN, COPD who is here today for cardiac follow up. She has a hx of CAD, s/p BMS to LAD in 8/10, DES to LAD for ISR 2011. She was admitted to [facility]/20-11/21/13 with left shoulder pain. This was reminiscent of her previous angina. Cardiac markers were negative. LHC 04/13/12: pLAD stent patent, pRCA 30%, mRCA 10%, EF 60%. Medical therapy was recommended. She was placed on isosorbide. Chest x-ray demonstrated questionable left upper lobe nodule. Outpatient CT chest January 2014 with no worrisome masses or nodules. She is followed in the pulmonary office for her severe COPD.   She is doing well. Dyspnea is stable. No chest pain. No orthopnea, PND, edema. No syncope. She wears supplemental O2 all day.   Primary Care Physician: Cathlean Cower  Last Lipid Profile:Lipid Panel     Component Value Date/Time   CHOL 160 07/18/2012 1418   TRIG 271.0* 07/18/2012 1418   HDL 53.20 07/18/2012 1418   CHOLHDL 3 07/18/2012 1418   VLDL 54.2* 07/18/2012 1418   [place] 66 04/13/2012 0211    Past Medical History  Diagnosis Date  . Impaired glucose tolerance 01/07/2011  . ANXIETY 01/01/2007  . BURSITIS, RIGHT HIP 06/04/2009  . CAD (coronary artery disease)     a. BMS to LAD 2010. b. NSTEMI with DES to LAD for ISR 2011. c. Patent stent 03/2012/Imdur added.  . CHEST PAIN-PRECORDIAL 01/15/2009  . COPD 01/01/2007    a. Chronic resp failure on home O2.  Marland Kitchen DEPRESSION 01/01/2007  . GROIN PAIN 06/20/2008  . Headache(784.0) 01/01/2007  . HYPERTENSION 01/01/2007  . LOW BACK PAIN 01/01/2007  . Muscle weakness (generalized) 06/04/2009  . OSTEOARTHRITIS, HIP 07/01/2008  . OSTEOPOROSIS 01/01/2007  . SYNCOPE 01/01/2007  . TRANSIENT ISCHEMIC ATTACK, HX OF 01/01/2007  . Eczema 01/08/2011  . Rosacea 01/08/2011  . Colon polyps     H/o tubular adenoma of colon  . Hemorrhoid   . Pneumonia 1998  . Cataract   . Abnormal chest x-ray     03/2012: will need OP f/u.     Past Surgical History  Procedure Laterality Date  . Appendectomy    . Abdominal hysterectomy    . Oophorectomy      one ovary  . Sp lumbar disc surgury      [name]  . S/p bilat cataract  2010  . Coronary stent placement    . Colonoscopy  01/25/2002    tubular adenoma,hemorrhoids, hyperplastic  colon polyps  . Colonoscopy  02/17/2005    hemorrhoids    Current Outpatient Prescriptions  Medication Sig Dispense Refill  . aspirin EC 81 MG tablet Take 81 mg by mouth daily.      . budesonide-formoterol (SYMBICORT) 160-4.5 MCG/ACT inhaler Take 2 puffs first thing in am and then another 2 puffs about 12 hours later.  1 Inhaler  12  . Cholecalciferol (VITAMIN D) 1000 UNITS capsule Take 1,000 Units by mouth every evening.       . docusate sodium (COLACE) 100 MG capsule Take 100 mg by mouth daily as needed. For constipation      . isosorbide mononitrate (IMDUR) 30 MG 24 hr tablet TAKE 0.5 TABLETS (15 MG TOTAL) BY MOUTH DAILY.  15 tablet  6  . lovastatin (MEVACOR) 20 MG tablet Take 1 tablet (20 mg total) by mouth at bedtime.  90 tablet  3  . metoprolol tartrate (LOPRESSOR) 25 MG tablet TAKE 0.5 TABLETS (  12.5 MG TOTAL) BY MOUTH 2 (TWO) TIMES DAILY.  30 tablet  0  . nitroGLYCERIN (NITROSTAT) 0.4 MG SL tablet Place 1 tablet (0.4 mg total) under the tongue every 5 (five) minutes as needed for chest pain (up to 3 doses).  25 tablet  4  . nortriptyline (PAMELOR) 10 MG capsule Take 10 mg by mouth 2 (two) times daily.       . ondansetron (ZOFRAN) 4 MG tablet Take 4 mg by mouth 3 (three) times daily as needed. For nausea (take with tramadol)      . polyethylene glycol powder (MIRALAX) powder Take 17 g by mouth daily as needed. For constipation      . tiotropium (SPIRIVA) 18 MCG inhalation capsule Place 18 mcg into inhaler and inhale daily.      . traMADol (ULTRAM) 50 MG tablet Take 100 mg by mouth 3 (three) times daily as needed. For pain       No current facility-administered medications for  this visit.    Allergies  Allergen Reactions  . Aspirin Other (See Comments)     cns bleed risk  . Codeine Rash    History   Social History  . Marital Status: Married    Spouse Name: N/A    Number of Children: 4  . Years of Education: N/A   Occupational History  . disabled back since 2003    Social History Main Topics  . Smoking status: Former Smoker -- 1.00 packs/day for 50 years    Types: Cigarettes    Quit date: 07/24/2005  . Smokeless tobacco: Never Used  . Alcohol Use: No  . Drug Use: No  . Sexual Activity: Not on file   Other Topics Concern  . Not on file   Social History Narrative  . No narrative on file    Family History  Problem Relation Age of Onset  . Osteoporosis Mother   . Heart disease Sister   . Colon cancer    . Seizures Sister     epilepsy  . Cardiomyopathy Sister   . Heart attack Sister     Review of Systems:  As stated in the HPI and otherwise negative.   BP 122/68  Pulse 95  Ht 5\' 6"  (1.676 m)  Wt 116 lb 3.2 oz (52.708 kg)  BMI 18.76 kg/m2  Physical Examination: General: Well developed, well nourished, NAD HEENT: OP clear, mucus membranes moist SKIN: warm, dry. No rashes. Neuro: No focal deficits Musculoskeletal: Muscle strength 5/5 all ext Psychiatric: Mood and affect normal Neck: No JVD, no carotid bruits, no thyromegaly, no lymphadenopathy. Lungs:Clear bilaterally, no wheezes, rhonci, crackles Cardiovascular: Regular rate and rhythm. No murmurs, gallops or rubs. Abdomen:Soft. Bowel sounds present. Non-tender.  Extremities: No lower extremity edema. Pulses are 2 + in the bilateral DP/PT.  Chest CT: 06/01/12:  1. No suspicious pulmonary nodule in the left upper lobe to correspond to the plain film abnormality which likely represented a summation of shadows. 2. Stable small pulmonary nodules in the right middle lobe. 3. Central lobular emphysema again demonstrated. 4. Coronary calcifications.  EKG: NSR, rate 95  bpm.  Assessment and Plan:   1. Coronary Artery Disease: Stable. No further angina. Tolerating Imdur. Continue current Rx with ASA,beta blocker and nitrates, statin. Will add Plavix 75 mg po Qdaily based on results of DAPT trial.   2. Hyperlipidemia: Controlled. Continue statin.   3. Hypertension: Controlled. Continue current therapy.   4. Lung Nodule: Chest CT without any worrisome masses.

## 2013-07-27 NOTE — Patient Instructions (Signed)
Your physician wants you to follow-up in:  12 months.  You will receive a reminder letter in the mail two months in advance. If you don't receive a letter, please call our office to schedule the follow-up appointment.  Your physician has recommended you make the following change in your medication: Start Clopidogrel 75 mg by mouth daily.    

## 2013-07-31 ENCOUNTER — Ambulatory Visit: Payer: Medicare Other | Admitting: Cardiovascular Disease

## 2013-08-06 ENCOUNTER — Encounter: Payer: Self-pay | Admitting: Internal Medicine

## 2013-08-15 ENCOUNTER — Other Ambulatory Visit: Payer: Self-pay | Admitting: Cardiovascular Disease

## 2013-09-11 ENCOUNTER — Other Ambulatory Visit: Payer: Self-pay | Admitting: Internal Medicine

## 2013-09-30 ENCOUNTER — Inpatient Hospital Stay (HOSPITAL_COMMUNITY)
Admission: EM | Admit: 2013-09-30 | Discharge: 2013-10-04 | DRG: 470 | Disposition: A | Discharge status: Skilled Nursing Facility | Payer: Medicare Other | Attending: Family Medicine | Admitting: Family Medicine

## 2013-09-30 ENCOUNTER — Encounter (HOSPITAL_COMMUNITY): Payer: Self-pay | Admitting: Emergency Medicine

## 2013-09-30 ENCOUNTER — Emergency Department (HOSPITAL_COMMUNITY): Payer: Medicare Other

## 2013-09-30 DIAGNOSIS — Y92009 Unspecified place in unspecified non-institutional (private) residence as the place of occurrence of the external cause: Secondary | ICD-10-CM

## 2013-09-30 DIAGNOSIS — J449 Chronic obstructive pulmonary disease, unspecified: Secondary | ICD-10-CM

## 2013-09-30 DIAGNOSIS — K5900 Constipation, unspecified: Secondary | ICD-10-CM | POA: Diagnosis present

## 2013-09-30 DIAGNOSIS — F329 Major depressive disorder, single episode, unspecified: Secondary | ICD-10-CM | POA: Diagnosis present

## 2013-09-30 DIAGNOSIS — J4489 Other specified chronic obstructive pulmonary disease: Secondary | ICD-10-CM | POA: Diagnosis present

## 2013-09-30 DIAGNOSIS — D62 Acute posthemorrhagic anemia: Secondary | ICD-10-CM | POA: Diagnosis not present

## 2013-09-30 DIAGNOSIS — Z9861 Coronary angioplasty status: Secondary | ICD-10-CM

## 2013-09-30 DIAGNOSIS — J9610 Chronic respiratory failure, unspecified whether with hypoxia or hypercapnia: Secondary | ICD-10-CM | POA: Diagnosis present

## 2013-09-30 DIAGNOSIS — S72009A Fracture of unspecified part of neck of unspecified femur, initial encounter for closed fracture: Principal | ICD-10-CM | POA: Diagnosis present

## 2013-09-30 DIAGNOSIS — M1610 Unilateral primary osteoarthritis, unspecified hip: Secondary | ICD-10-CM | POA: Diagnosis present

## 2013-09-30 DIAGNOSIS — W19XXXA Unspecified fall, initial encounter: Secondary | ICD-10-CM

## 2013-09-30 DIAGNOSIS — Z8673 Personal history of transient ischemic attack (TIA), and cerebral infarction without residual deficits: Secondary | ICD-10-CM

## 2013-09-30 DIAGNOSIS — I129 Hypertensive chronic kidney disease with stage 1 through stage 4 chronic kidney disease, or unspecified chronic kidney disease: Secondary | ICD-10-CM | POA: Diagnosis present

## 2013-09-30 DIAGNOSIS — J439 Emphysema, unspecified: Secondary | ICD-10-CM | POA: Diagnosis present

## 2013-09-30 DIAGNOSIS — S1093XA Contusion of unspecified part of neck, initial encounter: Secondary | ICD-10-CM

## 2013-09-30 DIAGNOSIS — Z8679 Personal history of other diseases of the circulatory system: Secondary | ICD-10-CM

## 2013-09-30 DIAGNOSIS — M21371 Foot drop, right foot: Secondary | ICD-10-CM | POA: Diagnosis present

## 2013-09-30 DIAGNOSIS — R5082 Postprocedural fever: Secondary | ICD-10-CM | POA: Diagnosis not present

## 2013-09-30 DIAGNOSIS — Z9849 Cataract extraction status, unspecified eye: Secondary | ICD-10-CM

## 2013-09-30 DIAGNOSIS — F3289 Other specified depressive episodes: Secondary | ICD-10-CM | POA: Diagnosis present

## 2013-09-30 DIAGNOSIS — I959 Hypotension, unspecified: Secondary | ICD-10-CM | POA: Diagnosis present

## 2013-09-30 DIAGNOSIS — Z79899 Other long term (current) drug therapy: Secondary | ICD-10-CM

## 2013-09-30 DIAGNOSIS — I2510 Atherosclerotic heart disease of native coronary artery without angina pectoris: Secondary | ICD-10-CM | POA: Diagnosis present

## 2013-09-30 DIAGNOSIS — I10 Essential (primary) hypertension: Secondary | ICD-10-CM

## 2013-09-30 DIAGNOSIS — M169 Osteoarthritis of hip, unspecified: Secondary | ICD-10-CM | POA: Diagnosis present

## 2013-09-30 DIAGNOSIS — M810 Age-related osteoporosis without current pathological fracture: Secondary | ICD-10-CM | POA: Diagnosis present

## 2013-09-30 DIAGNOSIS — Z9981 Dependence on supplemental oxygen: Secondary | ICD-10-CM

## 2013-09-30 DIAGNOSIS — Z87891 Personal history of nicotine dependence: Secondary | ICD-10-CM

## 2013-09-30 DIAGNOSIS — I252 Old myocardial infarction: Secondary | ICD-10-CM

## 2013-09-30 DIAGNOSIS — N189 Chronic kidney disease, unspecified: Secondary | ICD-10-CM | POA: Diagnosis present

## 2013-09-30 DIAGNOSIS — M216X9 Other acquired deformities of unspecified foot: Secondary | ICD-10-CM | POA: Diagnosis present

## 2013-09-30 DIAGNOSIS — F411 Generalized anxiety disorder: Secondary | ICD-10-CM | POA: Diagnosis present

## 2013-09-30 DIAGNOSIS — Z7982 Long term (current) use of aspirin: Secondary | ICD-10-CM

## 2013-09-30 DIAGNOSIS — S72002A Fracture of unspecified part of neck of left femur, initial encounter for closed fracture: Secondary | ICD-10-CM | POA: Diagnosis present

## 2013-09-30 DIAGNOSIS — S0083XA Contusion of other part of head, initial encounter: Secondary | ICD-10-CM | POA: Diagnosis present

## 2013-09-30 DIAGNOSIS — W010XXA Fall on same level from slipping, tripping and stumbling without subsequent striking against object, initial encounter: Secondary | ICD-10-CM | POA: Diagnosis present

## 2013-09-30 DIAGNOSIS — Z7902 Long term (current) use of antithrombotics/antiplatelets: Secondary | ICD-10-CM

## 2013-09-30 DIAGNOSIS — S0003XA Contusion of scalp, initial encounter: Secondary | ICD-10-CM | POA: Diagnosis present

## 2013-09-30 DIAGNOSIS — S0093XA Contusion of unspecified part of head, initial encounter: Secondary | ICD-10-CM | POA: Diagnosis present

## 2013-09-30 DIAGNOSIS — E785 Hyperlipidemia, unspecified: Secondary | ICD-10-CM | POA: Diagnosis present

## 2013-09-30 LAB — CBC
HCT: 36.8 % (ref 36.0–46.0)
Hemoglobin: 11.8 g/dL — ABNORMAL LOW (ref 12.0–15.0)
MCH: 27.1 pg (ref 26.0–34.0)
MCHC: 32.1 g/dL (ref 30.0–36.0)
MCV: 84.4 fL (ref 78.0–100.0)
PLATELETS: 244 10*3/uL (ref 150–400)
RBC: 4.36 MIL/uL (ref 3.87–5.11)
RDW: 13.2 % (ref 11.5–15.5)
WBC: 8.4 10*3/uL (ref 4.0–10.5)

## 2013-09-30 LAB — BASIC METABOLIC PANEL
BUN: 7 mg/dL (ref 6–23)
CO2: 32 meq/L (ref 19–32)
CREATININE: 0.8 mg/dL (ref 0.50–1.10)
Calcium: 9.3 mg/dL (ref 8.4–10.5)
Chloride: 99 mEq/L (ref 96–112)
GFR calc Af Amer: 83 mL/min — ABNORMAL LOW (ref >90)
GFR calc non Af Amer: 72 mL/min — ABNORMAL LOW (ref >90)
Glucose, Bld: 100 mg/dL — ABNORMAL HIGH (ref 70–99)
POTASSIUM: 3.6 meq/L — AB (ref 3.7–5.3)
Sodium: 139 mEq/L (ref 137–147)

## 2013-09-30 LAB — URINALYSIS, ROUTINE W REFLEX MICROSCOPIC
Bilirubin Urine: NEGATIVE
Glucose, UA: NEGATIVE mg/dL
HGB URINE DIPSTICK: NEGATIVE
Ketones, ur: NEGATIVE mg/dL
Leukocytes, UA: NEGATIVE
NITRITE: NEGATIVE
Protein, ur: NEGATIVE mg/dL
Specific Gravity, Urine: 1.01 (ref 1.005–1.030)
UROBILINOGEN UA: 0.2 mg/dL (ref 0.0–1.0)
pH: 8 (ref 5.0–8.0)

## 2013-09-30 LAB — PROTIME-INR
INR: 0.98 (ref 0.00–1.49)
PROTHROMBIN TIME: 12.8 s (ref 11.6–15.2)

## 2013-09-30 MED ORDER — MORPHINE SULFATE 2 MG/ML IJ SOLN
2.0000 mg | Freq: Once | INTRAMUSCULAR | Status: AC
Start: 2013-09-30 — End: 2013-09-30
  Administered 2013-09-30: 2 mg via INTRAVENOUS
  Filled 2013-09-30: qty 1

## 2013-09-30 MED ORDER — MORPHINE SULFATE 2 MG/ML IJ SOLN
0.5000 mg | INTRAMUSCULAR | Status: DC | PRN
  Administered 2013-10-01 (×3): 0.5 mg via INTRAVENOUS
  Filled 2013-09-30 (×3): qty 1

## 2013-09-30 MED ORDER — METHOCARBAMOL 1000 MG/10ML IJ SOLN
500.0000 mg | Freq: Four times a day (QID) | INTRAMUSCULAR | Status: DC | PRN
  Filled 2013-09-30: qty 5

## 2013-09-30 MED ORDER — METHOCARBAMOL 500 MG PO TABS
500.0000 mg | ORAL_TABLET | Freq: Four times a day (QID) | ORAL | Status: DC | PRN
  Administered 2013-10-01 – 2013-10-02 (×3): 500 mg via ORAL
  Filled 2013-09-30 (×4): qty 1

## 2013-09-30 MED ORDER — CHLORHEXIDINE GLUCONATE 4 % EX LIQD
60.0000 mL | Freq: Once | CUTANEOUS | Status: DC
  Filled 2013-09-30: qty 60

## 2013-09-30 MED ORDER — MORPHINE SULFATE 2 MG/ML IJ SOLN
2.0000 mg | Freq: Once | INTRAMUSCULAR | Status: AC
  Administered 2013-09-30: 2 mg via INTRAVENOUS
  Filled 2013-09-30: qty 1

## 2013-09-30 MED ORDER — ALBUTEROL SULFATE (2.5 MG/3ML) 0.083% IN NEBU: 2.5000 mg | INHALATION_SOLUTION | RESPIRATORY_TRACT | Status: DC | PRN

## 2013-09-30 MED ORDER — ONDANSETRON HCL 4 MG/2ML IJ SOLN
4.0000 mg | Freq: Once | INTRAMUSCULAR | Status: AC
  Administered 2013-09-30: 4 mg via INTRAVENOUS
  Filled 2013-09-30: qty 2

## 2013-09-30 MED ORDER — CEFAZOLIN SODIUM-DEXTROSE 2-3 GM-% IV SOLR
2.0000 g | INTRAVENOUS | Status: AC
  Administered 2013-10-01: 2 g via INTRAVENOUS
  Filled 2013-09-30: qty 50

## 2013-09-30 MED ORDER — MORPHINE SULFATE 2 MG/ML IJ SOLN
2.0000 mg | Freq: Once | INTRAMUSCULAR | Status: AC
  Administered 2013-09-30: 2 mg via INTRAVENOUS

## 2013-09-30 MED ORDER — SODIUM CHLORIDE 0.9 % IV SOLN: INTRAVENOUS | Status: AC

## 2013-09-30 MED ORDER — ONDANSETRON HCL 4 MG/2ML IJ SOLN
4.0000 mg | Freq: Four times a day (QID) | INTRAMUSCULAR | Status: DC | PRN
Start: 2013-09-30 — End: 2013-10-03
  Administered 2013-10-01 – 2013-10-03 (×7): 4 mg via INTRAVENOUS
  Filled 2013-09-30 (×7): qty 2

## 2013-09-30 MED ORDER — MORPHINE SULFATE 2 MG/ML IJ SOLN
INTRAMUSCULAR | Status: AC
  Filled 2013-09-30: qty 1

## 2013-09-30 MED ORDER — SODIUM CHLORIDE 0.9 % IV SOLN: INTRAVENOUS | Status: DC

## 2013-09-30 MED ORDER — HYDROCODONE-ACETAMINOPHEN 5-325 MG PO TABS
1.0000 | ORAL_TABLET | Freq: Four times a day (QID) | ORAL | Status: DC | PRN
  Administered 2013-10-01 – 2013-10-02 (×4): 2 via ORAL
  Administered 2013-10-02: 1 via ORAL
  Administered 2013-10-03 – 2013-10-04 (×5): 2 via ORAL
  Filled 2013-09-30: qty 1
  Filled 2013-09-30 (×10): qty 2

## 2013-09-30 NOTE — ED Notes (Signed)
Xu, MD at bedside.  

## 2013-09-30 NOTE — H&P (Signed)
PCP:   Alexis Cower, MD   Chief Complaint:  fall  HPI: 73 yo female with h/o copd, CRF on home oxygen, CAD, htn comes in after having a mechanical fall at family members house.  She did not have LOC but did bump her head.  She is on plavix.  She has been in her usual state of health, with no recent illnesses.  She just turned to quickly on her feet and loss balance.  She has a left hip fracture.  Her pain is well controlled now.  No focal neurological deficits.  Her head is hurting where she is developing a bruise from the fall on her left temporal area.  No vision changes.  No dizziness.    Review of Systems:  Positive and negative as per HPI otherwise all other systems are negative  Past Medical History: Past Medical History  Diagnosis Date  . Impaired glucose tolerance 01/07/2011  . ANXIETY 01/01/2007  . BURSITIS, RIGHT HIP 06/04/2009  . CAD (coronary artery disease)     a. BMS to LAD 2010. b. NSTEMI with DES to LAD for ISR 2011. c. Patent stent 03/2012/Imdur added.  . CHEST PAIN-PRECORDIAL 01/15/2009  . COPD 01/01/2007    a. Chronic resp failure on home O2.  Marland Kitchen DEPRESSION 01/01/2007  . GROIN PAIN 06/20/2008  . Headache(784.0) 01/01/2007  . HYPERTENSION 01/01/2007  . LOW BACK PAIN 01/01/2007  . Muscle weakness (generalized) 06/04/2009  . OSTEOARTHRITIS, HIP 07/01/2008  . OSTEOPOROSIS 01/01/2007  . SYNCOPE 01/01/2007  . TRANSIENT ISCHEMIC ATTACK, HX OF 01/01/2007  . Eczema 01/08/2011  . Rosacea 01/08/2011  . Colon polyps     H/o tubular adenoma of colon  . Hemorrhoid   . Pneumonia 1998  . Cataract   . Abnormal chest x-ray     03/2012: will need OP f/u.   Past Surgical History  Procedure Laterality Date  . Appendectomy    . Abdominal hysterectomy    . Oophorectomy      one ovary  . Sp lumbar disc surgury      [name]  . S/p bilat cataract  2010  . Coronary stent placement    . Colonoscopy  01/25/2002    tubular adenoma,hemorrhoids, hyperplastic  colon polyps  . Colonoscopy   02/17/2005    hemorrhoids    Medications: Prior to Admission medications   Medication Sig Start Date End Date Taking? Authorizing Provider  aspirin EC 81 MG tablet Take 81 mg by mouth daily.   Yes Historical Provider, MD  budesonide-formoterol (SYMBICORT) 160-4.5 MCG/ACT inhaler Take 2 puffs first thing in am and then another 2 puffs about 12 hours later. 12/27/12  Yes Tanda Rockers, MD  Cholecalciferol (VITAMIN D) 1000 UNITS capsule Take 1,000 Units by mouth every evening.    Yes Historical Provider, MD  clopidogrel (PLAVIX) 75 MG tablet Take 1 tablet (75 mg total) by mouth daily. 07/27/13  Yes Burnell Blanks, MD  docusate sodium (COLACE) 100 MG capsule Take 100 mg by mouth daily as needed. For constipation   Yes Historical Provider, MD  isosorbide mononitrate (IMDUR) 30 MG 24 hr tablet TAKE 0.5 TABLETS (15 MG TOTAL) BY MOUTH DAILY.   Yes Burnell Blanks, MD  lovastatin (MEVACOR) 20 MG tablet Take 1 tablet (20 mg total) by mouth at bedtime. 01/16/13  Yes Biagio Borg, MD  metoprolol tartrate (LOPRESSOR) 25 MG tablet TAKE 1/2 TABLET BY MOUTH 2 TIMES A DAY 08/15/13  Yes Burnell Blanks, MD  nortriptyline (  PAMELOR) 10 MG capsule Take 10 mg by mouth.  07/24/13  Yes Historical Provider, MD  ondansetron (ZOFRAN) 4 MG tablet Take 4 mg by mouth 3 (three) times daily as needed. For nausea (take with tramadol) 06/21/11  Yes Historical Provider, MD  polyethylene glycol powder (MIRALAX) powder Take 17 g by mouth daily as needed. For constipation   Yes Historical Provider, MD  SPIRIVA HANDIHALER 18 MCG inhalation capsule INHALE 1 CAPSULE VIA HANDIHALER ONCE DAILY AT THE SAME TIME EVERY DAY 09/11/13  Yes Biagio Borg, MD  tiotropium (SPIRIVA) 18 MCG inhalation capsule Place 18 mcg into inhaler and inhale daily.   Yes Historical Provider, MD  traMADol (ULTRAM) 50 MG tablet Take 100 mg by mouth 3 (three) times daily as needed. For pain   Yes Historical Provider, MD  nitroGLYCERIN (NITROSTAT) 0.4 MG  SL tablet Place 1 tablet (0.4 mg total) under the tongue every 5 (five) minutes as needed for chest pain (up to 3 doses). 04/13/12   Dayna N Dunn, PA-C    Allergies:   Allergies  Allergen Reactions  . Aspirin Other (See Comments)     cns bleed risk  . Codeine Rash    Social History:  reports that she quit smoking about 8 years ago. Her smoking use included Cigarettes. She has a 50 pack-year smoking history. She has never used smokeless tobacco. She reports that she does not drink alcohol or use illicit drugs.  Family History: Family History  Problem Relation Age of Onset  . Osteoporosis Mother   . Heart disease Sister   . Colon cancer    . Seizures Sister     epilepsy  . Cardiomyopathy Sister   . Heart attack Sister     Physical Exam: Filed Vitals:   09/30/13 1630 09/30/13 1745 09/30/13 1800 09/30/13 1900  BP: 107/69 143/82 138/81 149/85  Pulse: 73 76 80 86  Temp:      TempSrc:      Resp: 14 15 16 14   SpO2: 100% 100% 100% 100%   General appearance: alert, cooperative and no distress Head: Normocephalic, without obvious abnormality, scalp contusion Eyes: negative Nose: Nares normal. Septum midline. Mucosa normal. No drainage or sinus tenderness. Neck: no JVD and supple, symmetrical, trachea midline Lungs: clear to auscultation bilaterally Heart: regular rate and rhythm, S1, S2 normal, no murmur, click, rub or gallop Abdomen: soft, non-tender; bowel sounds normal; no masses,  no organomegaly Extremities: extremities normal, atraumatic, no cyanosis or edema  Painful left hip with any movement Pulses: 2+ and symmetric Skin: Skin color, texture, turgor normal. No rashes or lesions Neurologic: Grossly normal  Labs on Admission:   Recent Labs  09/30/13 1714  NA 139  K 3.6*  CL 99  CO2 32  GLUCOSE 100*  BUN 7  CREATININE 0.80  CALCIUM 9.3    Recent Labs  09/30/13 1714  WBC 8.4  HGB 11.8*  HCT 36.8  MCV 84.4  PLT 244    Radiological Exams on  Admission: Dg Hip Complete Left  09/30/2013   CLINICAL DATA:  [place] and injured left hip.  EXAM: LEFT HIP - COMPLETE 2+ VIEW  COMPARISON:  None.  FINDINGS: Comminuted fracture involving the basicervical region of the left femoral neck. Left hip joint anatomically aligned with mild to moderate medial joint space narrowing. No other fractures.  Included AP pelvis demonstrates severe medial joint space narrowing involving the contralateral right hip with hypertrophic spurring involving the femoral head. Sacroiliac joints and symphysis pubis intact.  Degenerative changes involving the visualized lower lumbar spine.  IMPRESSION: 1. Acute basicervical left femoral neck fracture. 2. 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 3. 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   Electronically Signed   By: Hulan Saas M.D.   On: 09/30/2013 16:41   Ct Head Wo Contrast  09/30/2013   CLINICAL DATA:  Fall, patient was walking down the steps in tripped over oxygen tank in hit head on wall and now has hematoma left side of forehead  EXAM: CT HEAD WITHOUT CONTRAST  TECHNIQUE: Contiguous axial images were obtained from the base of the skull through the vertex without intravenous contrast.  COMPARISON:  MR HEAD W/O CM dated 06/12/2005; CT HEAD W/O CM dated 06/12/2005  FINDINGS: There is moderate diffuse atrophy. There is mild to moderate low attenuation in the deep white matter. There is no abnormal attenuation to suggest acute hemorrhage or extra-axial fluid. There is no evidence of vascular territory infarct. There is no evidence of mass or hydrocephalus. There is no skull fracture. There is a small hematoma in the scalp over the left frontal region. There is a stable 7 mm focus of hyperattenuation in the right cerebral peduncle. 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 No acute intracranial abnormalities.   Electronically Signed   By: Esperanza Heir M.D.   On: 09/30/2013 16:03   Dg Femur Left Port  09/30/2013   CLINICAL DATA:  Preop left hip pain.  EXAM: PORTABLE LEFT FEMUR - 2 VIEW  COMPARISON:  Pelvis/ left hip films earlier today.  FINDINGS: Exam again demonstrates patient's minimally displaced subcapital left femoral neck fracture. Remainder the exam is unremarkable.  IMPRESSION: Evidence of patient's minimally displaced subcapital left femoral neck fracture.   Electronically Signed   By: Elberta Fortis M.D.   On: 09/30/2013 18:43   Dg Knee Complete 4 Views Left  09/30/2013   CLINICAL DATA:  Fall earlier today. Abrasion overlying the patella. Bruising involving the anterior portion of the knee.  EXAM: LEFT KNEE - COMPLETE 4+ VIEW  COMPARISON:  None.  FINDINGS: No evidence of acute fracture or dislocation. Joint spaces well preserved for age. Mild osseous demineralization. Bone island in the proximal tibia. No visible joint effusion.  IMPRESSION: No acute osseous abnormality.  Mild osseous demineralization.   Electronically Signed   By: Hulan Saas M.D.   On: 09/30/2013 16:39    Assessment/Plan  73 yo female with left hip fracture from mechanical fall   Principal Problem:   Displaced fracture of left femoral neck-  Hip fracture pathway.  Pt is high risk due to her multiple comorbidies, but still needs surgical repair of her hip to maintain as much function as possible.  Her and her husband understand this, and are agreeable to surgical intervention.  Will keep npo after midnight.  Ortho team has already seen pt and will proceed to OR tomorrow afternoon.  Hold her plavix and aspirin products.  preop ekg nsr no acute changes.  All of her chronic medical issues are stable and at her baseline at this time.    Active Problems:  All stable unless o/w noted   HYPERLIPIDEMIA   ANXIETY   HYPERTENSION   COPD  GOLD III 02 dep   TRANSIENT ISCHEMIC ATTACK, HX OF   Chronic respiratory failure   Right foot drop  Fall at home   Head contusion-  Will obtain freq neuro cks overnight due to this head trauma while on plavix, if any neuro symptoms develop would do ct head stat.  There is no evidence of any now.  Pt is full code.  Discussed with her and her husband.  She wishes full code, but only wishes extreme measures for a couple of days if there is a chance of improvement, does not wish prolonged extreme measures ever in the future.  Husband understands this.  Admit to surgical floor.  Npo after midnight.  Alexis Higgins A Shanon Brow 09/30/2013, 7:46 PM

## 2013-09-30 NOTE — Consult Note (Addendum)
ORTHOPAEDIC CONSULTATION  REQUESTING PHYSICIAN: Osvaldo Shipper, MD  Chief Complaint: Left hip fracture  HPI: Alexis Higgins is a 73 y.o. female who complains of left hip pain s/p mechanical fall from tripping over oxygen tank.  Struck head, knee and elbow but denies LOC, neck pain, abd pain.  Endorses left hip pain.  Has CAD with stents on asa and plavix and COPD on home oxygen.  Took plavix today.  Walks w/o assistive devices at baseline.  Does have mild baseline hip pain secondary to hip OA.    Past Medical History  Diagnosis Date  . Impaired glucose tolerance 01/07/2011  . ANXIETY 01/01/2007  . BURSITIS, RIGHT HIP 06/04/2009  . CAD (coronary artery disease)     a. BMS to LAD 2010. b. NSTEMI with DES to LAD for ISR 2011. c. Patent stent 03/2012/Imdur added.  . CHEST PAIN-PRECORDIAL 01/15/2009  . COPD 01/01/2007    a. Chronic resp failure on home O2.  Marland Kitchen DEPRESSION 01/01/2007  . GROIN PAIN 06/20/2008  . Headache(784.0) 01/01/2007  . HYPERTENSION 01/01/2007  . LOW BACK PAIN 01/01/2007  . Muscle weakness (generalized) 06/04/2009  . OSTEOARTHRITIS, HIP 07/01/2008  . OSTEOPOROSIS 01/01/2007  . SYNCOPE 01/01/2007  . TRANSIENT ISCHEMIC ATTACK, HX OF 01/01/2007  . Eczema 01/08/2011  . Rosacea 01/08/2011  . Colon polyps     H/o tubular adenoma of colon  . Hemorrhoid   . Pneumonia 1998  . Cataract   . Abnormal chest x-ray     03/2012: will need OP f/u.   Past Surgical History  Procedure Laterality Date  . Appendectomy    . Abdominal hysterectomy    . Oophorectomy      one ovary  . Sp lumbar disc surgury      [name]  . S/p bilat cataract  2010  . Coronary stent placement    . Colonoscopy  01/25/2002    tubular adenoma,hemorrhoids, hyperplastic  colon polyps  . Colonoscopy  02/17/2005    hemorrhoids   History   Social History  . Marital Status: Married    Spouse Name: N/A    Number of Children: 4  . Years of Education: N/A   Occupational History  . disabled back  since 2003    Social History Main Topics  . Smoking status: Former Smoker -- 1.00 packs/day for 50 years    Types: Cigarettes    Quit date: 07/24/2005  . Smokeless tobacco: Never Used  . Alcohol Use: No  . Drug Use: No  . Sexual Activity: None   Other Topics Concern  . None   Social History Narrative  . None   Family History  Problem Relation Age of Onset  . Osteoporosis Mother   . Heart disease Sister   . Colon cancer    . Seizures Sister     epilepsy  . Cardiomyopathy Sister   . Heart attack Sister    Allergies  Allergen Reactions  . Aspirin Other (See Comments)     cns bleed risk  . Codeine Rash   Prior to Admission medications   Medication Sig Start Date End Date Taking? Authorizing Provider  aspirin EC 81 MG tablet Take 81 mg by mouth daily.   Yes Historical Provider, MD  budesonide-formoterol (SYMBICORT) 160-4.5 MCG/ACT inhaler Take 2 puffs first thing in am and then another 2 puffs about 12 hours later. 12/27/12  Yes Tanda Rockers, MD  Cholecalciferol (VITAMIN D) 1000 UNITS capsule Take 1,000 Units by mouth every  evening.    Yes Historical Provider, MD  clopidogrel (PLAVIX) 75 MG tablet Take 1 tablet (75 mg total) by mouth daily. 07/27/13  Yes Burnell Blanks, MD  docusate sodium (COLACE) 100 MG capsule Take 100 mg by mouth daily as needed. For constipation   Yes Historical Provider, MD  isosorbide mononitrate (IMDUR) 30 MG 24 hr tablet TAKE 0.5 TABLETS (15 MG TOTAL) BY MOUTH DAILY.   Yes Burnell Blanks, MD  lovastatin (MEVACOR) 20 MG tablet Take 1 tablet (20 mg total) by mouth at bedtime. 01/16/13  Yes Biagio Borg, MD  metoprolol tartrate (LOPRESSOR) 25 MG tablet TAKE 1/2 TABLET BY MOUTH 2 TIMES A DAY 08/15/13  Yes Burnell Blanks, MD  nortriptyline (PAMELOR) 10 MG capsule Take 10 mg by mouth.  07/24/13  Yes Historical Provider, MD  ondansetron (ZOFRAN) 4 MG tablet Take 4 mg by mouth 3 (three) times daily as needed. For nausea (take with tramadol)  06/21/11  Yes Historical Provider, MD  polyethylene glycol powder (MIRALAX) powder Take 17 g by mouth daily as needed. For constipation   Yes Historical Provider, MD  SPIRIVA HANDIHALER 18 MCG inhalation capsule INHALE 1 CAPSULE VIA HANDIHALER ONCE DAILY AT THE SAME TIME EVERY DAY 09/11/13  Yes Biagio Borg, MD  tiotropium (SPIRIVA) 18 MCG inhalation capsule Place 18 mcg into inhaler and inhale daily.   Yes Historical Provider, MD  traMADol (ULTRAM) 50 MG tablet Take 100 mg by mouth 3 (three) times daily as needed. For pain   Yes Historical Provider, MD  nitroGLYCERIN (NITROSTAT) 0.4 MG SL tablet Place 1 tablet (0.4 mg total) under the tongue every 5 (five) minutes as needed for chest pain (up to 3 doses). 04/13/12   Dayna N Dunn, PA-C   Dg Hip Complete Left  09/30/2013   CLINICAL DATA:  [place] and injured left hip.  EXAM: LEFT HIP - COMPLETE 2+ VIEW  COMPARISON:  None.  FINDINGS: Comminuted fracture involving the basicervical region of the left femoral neck. Left hip joint anatomically aligned with mild to moderate medial joint space narrowing. No other fractures.  Included AP pelvis demonstrates severe medial joint space narrowing involving the contralateral right hip with hypertrophic spurring involving the femoral head. Sacroiliac joints and symphysis pubis intact. Degenerative changes involving the visualized lower lumbar spine.  IMPRESSION: 1. Acute basicervical left femoral neck fracture. 2. 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 3. 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   Electronically Signed   By: Evangeline Dakin M.D.   On: 09/30/2013 16:41   Ct Head Wo Contrast  09/30/2013   CLINICAL DATA:  Fall, patient was walking down the steps in tripped over oxygen tank in hit head on wall and now has hematoma left side of forehead  EXAM: CT HEAD WITHOUT CONTRAST  TECHNIQUE: Contiguous axial images were obtained from the base of the skull through the vertex without  intravenous contrast.  COMPARISON:  MR HEAD W/O CM dated 06/12/2005; CT HEAD W/O CM dated 06/12/2005  FINDINGS: There is moderate diffuse atrophy. There is mild to moderate low attenuation in the deep white matter. There is no abnormal attenuation to suggest acute hemorrhage or extra-axial fluid. There is no evidence of vascular territory infarct. There is no evidence of mass or hydrocephalus. There is no skull fracture. There is a small hematoma in the scalp over the left frontal region. There is a stable 7 mm focus of hyperattenuation in the right cerebral peduncle. 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 No acute intracranial abnormalities.   Electronically Signed   By: Skipper Cliche M.D.   On: 09/30/2013 16:03   Dg Knee Complete 4 Views Left  09/30/2013   CLINICAL DATA:  Fall earlier today. Abrasion overlying the patella. Bruising involving the anterior portion of the knee.  EXAM: LEFT KNEE - COMPLETE 4+ VIEW  COMPARISON:  None.  FINDINGS: No evidence of acute fracture or dislocation. Joint spaces well preserved for age. Mild osseous demineralization. Bone island in the proximal tibia. No visible joint effusion.  IMPRESSION: No acute osseous abnormality.  Mild osseous demineralization.   Electronically Signed   By: Evangeline Dakin M.D.   On: 09/30/2013 16:39    Positive ROS: All other systems have been reviewed and were otherwise negative with the exception of those mentioned in the HPI and as above.  Physical Exam: General: Alert, no acute distress Cardiovascular: No pedal edema Respiratory: No cyanosis, no use of accessory musculature GI: No organomegaly, abdomen is soft and non-tender Skin: No lesions in the area of chief complaint Neurologic: Sensation intact distally Psychiatric: Patient is competent for  consent with normal mood and affect Lymphatic: No axillary or cervical lymphadenopathy  MUSCULOSKELETAL:  - painful ROM of hip - skin intact - NVI distally - baseline foot drop on the contralateral side - superficial abrasion to left knee  Assessment: Left displaced femoral neck fracture  Plan: - reviewed and discussed findings at bedside with patient and husband - discussed r/b/a to partial hip replacement, all questions answered to their satisfaction - hold plavix and asa - admit to medicine - will plan for surgery tomorrow if medically stable - CAD and COPD per medicine - consent signed today  - Based on history and fracture pattern this likely represents a fragility fracture. - Fragility fractures affect up to one half of women and one third of men after age 94 years and occur in the setting of bone disorder such as osteoporosis or osteopenia and warrant appropriate work-up. - The following are general recommendations that may serve as an outline for an appropriate work-up:  1.) Obtain bone density measurement to confirm presumptive diagnosis, assess severity of osteoporosis and risk of future fracture, and use as baseline for monitoring treatment  2.) Obtain laboratory tests: CBC, ESR, serum calcium, creatinine, albumin,phosphate, alkaline phosphatase, liver transaminases, protein electrophoresis, urinalysis, 25-hydroxyvitamin D.  3.) Exclude secondary causes of low bone mass and skeletal fragility (eg,multiple myeloma, lymphoma) as indicated.  4.) Obtain radiograph of thoracic and lumbar spine, particularly among individuals with back pain or height loss to assess presence of vertebral fractures  5.) Intermittent administration of recombinant human parathyroid hormone  6.) Optimize nutritional status using nutritional supplementation.  7.) Patient/family education to prevent future falls.  8.) Early mobilization and exercise program - exercise decreases the rate of bone  loss and has been associated with decreased rate of fragility fractures   Thank you for the consult and the opportunity to see Ms. Beeck  N. Eduard Roux, MD [place] 6:15 PM

## 2013-09-30 NOTE — ED Provider Notes (Signed)
CSN: [id number]     Arrival date & time 09/30/13  1512 History   First MD Initiated Contact with Patient 09/30/13 1517     No chief complaint on file.    (Consider location/radiation/quality/duration/timing/severity/associated sxs/prior Treatment) HPI Comments: Lost her balance walking up steps. 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 No preceding CP, SOB, dizziness. No LOC.  Patient is a 73 y.o. female presenting with fall. The history is provided by the patient.  Fall This is a new problem. The current episode started less than 1 hour ago. Episode frequency: once. The problem has not changed since onset.Associated symptoms include headaches. Pertinent negatives include no chest pain, no abdominal pain and no shortness of breath. Nothing aggravates the symptoms. Nothing relieves the symptoms. She has tried nothing for the symptoms.    Past Medical History  Diagnosis Date  . Impaired glucose tolerance 01/07/2011  . ANXIETY 01/01/2007  . BURSITIS, RIGHT HIP 06/04/2009  . CAD (coronary artery disease)     a. BMS to LAD 2010. b. NSTEMI with DES to LAD for ISR 2011. c. Patent stent 03/2012/Imdur added.  . CHEST PAIN-PRECORDIAL 01/15/2009  . COPD 01/01/2007    a. Chronic resp failure on home O2.  Marland Kitchen DEPRESSION 01/01/2007  . GROIN PAIN 06/20/2008  . Headache(784.0) 01/01/2007  . HYPERTENSION 01/01/2007  . LOW BACK PAIN 01/01/2007  . Muscle weakness (generalized) 06/04/2009  . OSTEOARTHRITIS, HIP 07/01/2008  . OSTEOPOROSIS 01/01/2007  . SYNCOPE 01/01/2007  . TRANSIENT ISCHEMIC ATTACK, HX OF 01/01/2007  . Eczema 01/08/2011  . Rosacea 01/08/2011  . Colon polyps     H/o tubular adenoma of colon  . Hemorrhoid   . Pneumonia 1998  . Cataract   . Abnormal chest x-ray     03/2012: will need OP f/u.   Past Surgical History  Procedure Laterality Date  . Appendectomy    . Abdominal hysterectomy    . Oophorectomy      one ovary  . Sp lumbar disc surgury      [name]  . S/p  bilat cataract  2010  . Coronary stent placement    . Colonoscopy  01/25/2002    tubular adenoma,hemorrhoids, hyperplastic  colon polyps  . Colonoscopy  02/17/2005    hemorrhoids   Family History  Problem Relation Age of Onset  . Osteoporosis Mother   . Heart disease Sister   . Colon cancer    . Seizures Sister     epilepsy  . Cardiomyopathy Sister   . Heart attack Sister    History  Substance Use Topics  . Smoking status: Former Smoker -- 1.00 packs/day for 50 years    Types: Cigarettes    Quit date: 07/24/2005  . Smokeless tobacco: Never Used  . Alcohol Use: No   OB History   Grav Para Term Preterm Abortions TAB SAB Ect Mult Living                 Review of Systems  Constitutional: Negative for fever.  Respiratory: Negative for cough and shortness of breath.   Cardiovascular: Negative for chest pain.  Gastrointestinal: Negative for abdominal pain.  Neurological: Positive for headaches.  All other systems reviewed and are negative.     Allergies  Aspirin and Codeine  Home Medications   Prior to Admission medications   Medication Sig Start Date End Date Taking? Authorizing Provider  aspirin EC 81 MG tablet Take 81 mg by mouth daily.    Historical  Provider, MD  budesonide-formoterol (SYMBICORT) 160-4.5 MCG/ACT inhaler Take 2 puffs first thing in am and then another 2 puffs about 12 hours later. 12/27/12   Tanda Rockers, MD  Cholecalciferol (VITAMIN D) 1000 UNITS capsule Take 1,000 Units by mouth every evening.     Historical Provider, MD  clopidogrel (PLAVIX) 75 MG tablet Take 1 tablet (75 mg total) by mouth daily. 07/27/13   Burnell Blanks, MD  docusate sodium (COLACE) 100 MG capsule Take 100 mg by mouth daily as needed. For constipation    Historical Provider, MD  isosorbide mononitrate (IMDUR) 30 MG 24 hr tablet TAKE 0.5 TABLETS (15 MG TOTAL) BY MOUTH DAILY.    Burnell Blanks, MD  lovastatin (MEVACOR) 20 MG tablet Take 1 tablet (20 mg total) by mouth  at bedtime. 01/16/13   Biagio Borg, MD  metoprolol tartrate (LOPRESSOR) 25 MG tablet TAKE 1/2 TABLET BY MOUTH 2 TIMES A DAY 08/15/13   Burnell Blanks, MD  nitroGLYCERIN (NITROSTAT) 0.4 MG SL tablet Place 1 tablet (0.4 mg total) under the tongue every 5 (five) minutes as needed for chest pain (up to 3 doses). 04/13/12   Dayna N Dunn, PA-C  nortriptyline (PAMELOR) 10 MG capsule Take 10 mg by mouth 2 (two) times daily.  07/24/13   Historical Provider, MD  ondansetron (ZOFRAN) 4 MG tablet Take 4 mg by mouth 3 (three) times daily as needed. For nausea (take with tramadol) 06/21/11   Historical Provider, MD  polyethylene glycol powder (MIRALAX) powder Take 17 g by mouth daily as needed. For constipation    Historical Provider, MD  SPIRIVA HANDIHALER 18 MCG inhalation capsule INHALE 1 CAPSULE VIA HANDIHALER ONCE DAILY AT THE SAME TIME EVERY DAY 09/11/13   Biagio Borg, MD  tiotropium (SPIRIVA) 18 MCG inhalation capsule Place 18 mcg into inhaler and inhale daily.    Historical Provider, MD  traMADol (ULTRAM) 50 MG tablet Take 100 mg by mouth 3 (three) times daily as needed. For pain    Historical Provider, MD   There were no vitals taken for this visit. Physical Exam  Nursing note and vitals reviewed. Constitutional: She is oriented to person, place, and time. She appears well-developed and well-nourished. No distress.  HENT:  Head: Normocephalic.    Eyes: EOM are normal. Pupils are equal, round, and reactive to light.  Neck: Normal range of motion. Neck supple.  Cardiovascular: Normal rate and regular rhythm.  Exam reveals no friction rub.   No murmur heard. Pulmonary/Chest: Effort normal and breath sounds normal. No respiratory distress. She has no wheezes. She has no rales.  Abdominal: Soft. She exhibits no distension. There is no tenderness. There is no rebound.  Musculoskeletal: Normal range of motion. She exhibits no edema.  Neurological: She is alert and oriented to person, place, and  time. No cranial nerve deficit. She exhibits normal muscle tone. Coordination normal.  Skin: No rash noted. She is not diaphoretic.    ED Course  Procedures (including critical care time) Labs Review Labs Reviewed - No data to display  Imaging Review Dg Hip Complete Left  09/30/2013   CLINICAL DATA:  [place] and injured left hip.  EXAM: LEFT HIP - COMPLETE 2+ VIEW  COMPARISON:  None.  FINDINGS: Comminuted fracture involving the basicervical region of the left femoral neck. Left hip joint anatomically aligned with mild to moderate medial joint space narrowing. No other fractures.  Included AP pelvis demonstrates severe medial joint space narrowing involving the contralateral right  hip with hypertrophic spurring involving the femoral head. Sacroiliac joints and symphysis pubis intact. Degenerative changes involving the visualized lower lumbar spine.  IMPRESSION: 1. Acute basicervical left femoral neck fracture. 2. 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 3. 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   Electronically Signed   By: Evangeline Dakin M.D.   On: 09/30/2013 16:41   Ct Head Wo Contrast  09/30/2013   CLINICAL DATA:  Fall, patient was walking down the steps in tripped over oxygen tank in hit head on wall and now has hematoma left side of forehead  EXAM: CT HEAD WITHOUT CONTRAST  TECHNIQUE: Contiguous axial images were obtained from the base of the skull through the vertex without intravenous contrast.  COMPARISON:  MR HEAD W/O CM dated 06/12/2005; CT HEAD W/O CM dated 06/12/2005  FINDINGS: There is moderate diffuse atrophy. There is mild to moderate low attenuation in the deep white matter. There is no abnormal attenuation to suggest acute hemorrhage or extra-axial fluid. There is no evidence of vascular territory infarct. There is no evidence of mass or hydrocephalus. There is no skull fracture. There is a small hematoma in the scalp over the left frontal region. There  is a stable 7 mm focus of hyperattenuation in the right cerebral peduncle. 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 No acute intracranial abnormalities.   Electronically Signed   By: Skipper Cliche M.D.   On: 09/30/2013 16:03   Dg Femur Left Port  09/30/2013   CLINICAL DATA:  Preop left hip pain.  EXAM: PORTABLE LEFT FEMUR - 2 VIEW  COMPARISON:  Pelvis/ left hip films earlier today.  FINDINGS: Exam again demonstrates patient's minimally displaced subcapital left femoral neck fracture. Remainder the exam is unremarkable.  IMPRESSION: Evidence of patient's minimally displaced subcapital left femoral neck fracture.   Electronically Signed   By: Marin Olp M.D.   On: 09/30/2013 18:43   Dg Knee Complete 4 Views Left  09/30/2013   CLINICAL DATA:  Fall earlier today. Abrasion overlying the patella. Bruising involving the anterior portion of the knee.  EXAM: LEFT KNEE - COMPLETE 4+ VIEW  COMPARISON:  None.  FINDINGS: No evidence of acute fracture or dislocation. Joint spaces well preserved for age. Mild osseous demineralization. Bone island in the proximal tibia. No visible joint effusion.  IMPRESSION: No acute osseous abnormality.  Mild osseous demineralization.   Electronically Signed   By: Evangeline Dakin M.D.   On: 09/30/2013 16:39     EKG Interpretation   Date/Time:  Sunday Sep 30 2013 15:34:52 EDT Ventricular Rate:  75 PR Interval:  189 QRS Duration: 83 QT Interval:  391 QTC Calculation: 437 R Axis:   78 Text Interpretation:  Age not entered, assumed to be  73 years old for  purpose of ECG interpretation Sinus rhythm Anteroseptal infarct, old No  changes Confirmed by Mingo Amber  MD, [place] ([id number]) on 09/30/2013 5:44:50 PM      MDM   Final diagnoses:  Fracture of femoral neck, left, closed  Fall    7F here  s/p mechanical fall. On plavix. No LOC. Fell onto concrete and her oxygen tank. L temple hematoma, states mild blurry vision. No hyphema appreciated. Also having L knee pain. No L hip pain. Will xray L knee, L hip, CT head. L hip xray with fracture. Patient evaluated  by Dr. Erlinda Hong with Ortho, who will operate tmw. Dr. Shanon Brow admitting.  I have reviewed all labs and imaging and considered them in my medical decision making.   Osvaldo Shipper, MD 09/30/13 [phone]

## 2013-09-30 NOTE — Progress Notes (Signed)
Orthopedic Tech Progress Note Patient Details:  Alexis Higgins 1940-12-05 [id number]  Ortho Devices Ortho Device/Splint Location: trapeze bar patient helper Ortho Device/Splint Interventions: Application   [name] 09/30/2013, 9:13 PM

## 2013-09-30 NOTE — ED Notes (Signed)
Pt arrived by [facility] from home. Pt was walking down the steps with O2 tank, when she came to the bottom of step she tripped over tank and and hit head on wall and has a hematoma to left side of forehead. Pt also c/o left upper leg/hip pain and unable to put weight on left leg. Possibly landed on O2 tank. Husband at bedside. Pt does take Plavix. Denies any LOC.

## 2013-10-01 ENCOUNTER — Encounter (HOSPITAL_COMMUNITY): Payer: Self-pay | Admitting: Anesthesiology

## 2013-10-01 ENCOUNTER — Inpatient Hospital Stay (HOSPITAL_COMMUNITY): Payer: Medicare Other | Admitting: Certified Registered Nurse Anesthetist

## 2013-10-01 ENCOUNTER — Encounter (HOSPITAL_COMMUNITY): Payer: Medicare Other | Admitting: Certified Registered Nurse Anesthetist

## 2013-10-01 ENCOUNTER — Encounter (HOSPITAL_COMMUNITY)
Admission: EM | Disposition: A | Discharge status: Skilled Nursing Facility | Payer: Self-pay | Source: Home / Self Care | Attending: Family Medicine

## 2013-10-01 ENCOUNTER — Inpatient Hospital Stay (HOSPITAL_COMMUNITY): Payer: Medicare Other

## 2013-10-01 HISTORY — PX: HIP ARTHROPLASTY: SHX981

## 2013-10-01 LAB — CBC
HCT: 32.5 % — ABNORMAL LOW (ref 36.0–46.0)
Hemoglobin: 10.5 g/dL — ABNORMAL LOW (ref 12.0–15.0)
MCH: 27.2 pg (ref 26.0–34.0)
MCHC: 32.3 g/dL (ref 30.0–36.0)
MCV: 84.2 fL (ref 78.0–100.0)
PLATELETS: 205 10*3/uL (ref 150–400)
RBC: 3.86 MIL/uL — ABNORMAL LOW (ref 3.87–5.11)
RDW: 13.1 % (ref 11.5–15.5)
WBC: 6.9 10*3/uL (ref 4.0–10.5)

## 2013-10-01 LAB — BASIC METABOLIC PANEL
BUN: 7 mg/dL (ref 6–23)
CALCIUM: 8.3 mg/dL — AB (ref 8.4–10.5)
CO2: 28 mEq/L (ref 19–32)
CREATININE: 0.85 mg/dL (ref 0.50–1.10)
Chloride: 102 mEq/L (ref 96–112)
GFR calc Af Amer: 77 mL/min — ABNORMAL LOW (ref >90)
GFR, EST NON AFRICAN AMERICAN: 67 mL/min — AB (ref >90)
Glucose, Bld: 97 mg/dL (ref 70–99)
Potassium: 3.9 mEq/L (ref 3.7–5.3)
Sodium: 140 mEq/L (ref 137–147)

## 2013-10-01 LAB — PREPARE RBC (CROSSMATCH)

## 2013-10-01 LAB — ABO/RH: ABO/RH(D): O POS

## 2013-10-01 LAB — SURGICAL PCR SCREEN
MRSA, PCR: NEGATIVE
Staphylococcus aureus: NEGATIVE

## 2013-10-01 SURGERY — HEMIARTHROPLASTY, HIP, DIRECT ANTERIOR APPROACH, FOR FRACTURE
Anesthesia: General | Site: Hip | Laterality: Left

## 2013-10-01 MED ORDER — PHENYLEPHRINE HCL 10 MG/ML IJ SOLN
INTRAMUSCULAR | Status: DC | PRN
  Administered 2013-10-01: 80 ug via INTRAVENOUS

## 2013-10-01 MED ORDER — MORPHINE SULFATE 2 MG/ML IJ SOLN
0.5000 mg | INTRAMUSCULAR | Status: DC | PRN
  Administered 2013-10-01 – 2013-10-02 (×2): 2 mg via INTRAVENOUS
  Filled 2013-10-01 (×2): qty 1

## 2013-10-01 MED ORDER — OXYCODONE HCL 5 MG PO TABS
5.0000 mg | ORAL_TABLET | ORAL | Status: DC | PRN
  Administered 2013-10-01 – 2013-10-02 (×3): 10 mg via ORAL
  Filled 2013-10-01 (×3): qty 2

## 2013-10-01 MED ORDER — SODIUM CHLORIDE 0.9 % IV BOLUS (SEPSIS)
500.0000 mL | Freq: Once | INTRAVENOUS | Status: AC
Start: 2013-10-01 — End: 2013-10-02
  Administered 2013-10-02: 500 mL via INTRAVENOUS

## 2013-10-01 MED ORDER — FENTANYL CITRATE 0.05 MG/ML IJ SOLN
INTRAMUSCULAR | Status: AC
  Administered 2013-10-01: 25 ug via INTRAVENOUS
  Filled 2013-10-01: qty 2

## 2013-10-01 MED ORDER — ROCURONIUM BROMIDE 100 MG/10ML IV SOLN
INTRAVENOUS | Status: DC | PRN
  Administered 2013-10-01: 50 mg via INTRAVENOUS

## 2013-10-01 MED ORDER — MORPHINE SULFATE 2 MG/ML IJ SOLN: 0.5000 mg | INTRAMUSCULAR | Status: DC | PRN

## 2013-10-01 MED ORDER — ONDANSETRON HCL 4 MG/2ML IJ SOLN: 4.0000 mg | Freq: Four times a day (QID) | INTRAMUSCULAR | Status: DC | PRN

## 2013-10-01 MED ORDER — ENOXAPARIN SODIUM 40 MG/0.4ML ~~LOC~~ SOLN: 40.0000 mg | Freq: Every day | SUBCUTANEOUS | Status: DC

## 2013-10-01 MED ORDER — ASPIRIN EC 81 MG PO TBEC
81.0000 mg | DELAYED_RELEASE_TABLET | Freq: Every day | ORAL | Status: DC
  Administered 2013-10-01 – 2013-10-04 (×4): 81 mg via ORAL
  Filled 2013-10-01 (×4): qty 1

## 2013-10-01 MED ORDER — LACTATED RINGERS IV SOLN
INTRAVENOUS | Status: DC
  Administered 2013-10-01 (×2): via INTRAVENOUS

## 2013-10-01 MED ORDER — CEFAZOLIN SODIUM-DEXTROSE 2-3 GM-% IV SOLR
2.0000 g | Freq: Four times a day (QID) | INTRAVENOUS | Status: AC
  Administered 2013-10-01 – 2013-10-02 (×3): 2 g via INTRAVENOUS
  Filled 2013-10-01 (×3): qty 50

## 2013-10-01 MED ORDER — METOCLOPRAMIDE HCL 5 MG/ML IJ SOLN: 10.0000 mg | Freq: Once | INTRAMUSCULAR | Status: DC | PRN

## 2013-10-01 MED ORDER — 0.9 % SODIUM CHLORIDE (POUR BTL) OPTIME
TOPICAL | Status: DC | PRN
  Administered 2013-10-01 (×2): 1000 mL

## 2013-10-01 MED ORDER — SODIUM CHLORIDE 0.9 % IR SOLN
Status: DC | PRN
  Administered 2013-10-01: 1000 mL

## 2013-10-01 MED ORDER — FENTANYL CITRATE 0.05 MG/ML IJ SOLN
25.0000 ug | INTRAMUSCULAR | Status: DC | PRN
  Administered 2013-10-01 (×2): 25 ug via INTRAVENOUS

## 2013-10-01 MED ORDER — PROPOFOL 10 MG/ML IV BOLUS
INTRAVENOUS | Status: DC | PRN
  Administered 2013-10-01: 120 mg via INTRAVENOUS

## 2013-10-01 MED ORDER — TIOTROPIUM BROMIDE MONOHYDRATE 18 MCG IN CAPS
18.0000 ug | ORAL_CAPSULE | Freq: Every day | RESPIRATORY_TRACT | Status: DC
  Administered 2013-10-02 – 2013-10-04 (×3): 18 ug via RESPIRATORY_TRACT
  Filled 2013-10-01: qty 5

## 2013-10-01 MED ORDER — ONDANSETRON HCL 4 MG PO TABS
4.0000 mg | ORAL_TABLET | Freq: Four times a day (QID) | ORAL | Status: DC | PRN
  Administered 2013-10-02 – 2013-10-04 (×5): 4 mg via ORAL
  Filled 2013-10-01 (×6): qty 1

## 2013-10-01 MED ORDER — LIDOCAINE HCL (CARDIAC) 20 MG/ML IV SOLN
INTRAVENOUS | Status: DC | PRN
  Administered 2013-10-01: 100 mg via INTRAVENOUS

## 2013-10-01 MED ORDER — POLYETHYLENE GLYCOL 3350 17 G PO PACK: 17.0000 g | PACK | Freq: Every day | ORAL | Status: DC | PRN

## 2013-10-01 MED ORDER — ONDANSETRON HCL 4 MG/2ML IJ SOLN
INTRAMUSCULAR | Status: DC | PRN
  Administered 2013-10-01: 4 mg via INTRAVENOUS

## 2013-10-01 MED ORDER — SODIUM CHLORIDE 0.9 % IV SOLN
INTRAVENOUS | Status: DC
Start: 2013-10-01 — End: 2013-10-04
  Administered 2013-10-02: 13:00:00 via INTRAVENOUS

## 2013-10-01 MED ORDER — MAGNESIUM CITRATE PO SOLN: 1.0000 | Freq: Once | ORAL | Status: AC | PRN

## 2013-10-01 MED ORDER — METOCLOPRAMIDE HCL 10 MG PO TABS: 5.0000 mg | ORAL_TABLET | Freq: Three times a day (TID) | ORAL | Status: DC | PRN

## 2013-10-01 MED ORDER — GLYCOPYRROLATE 0.2 MG/ML IJ SOLN
INTRAMUSCULAR | Status: DC | PRN
  Administered 2013-10-01: 0.4 mg via INTRAVENOUS

## 2013-10-01 MED ORDER — SORBITOL 70 % SOLN
30.0000 mL | Freq: Every day | Status: DC | PRN
  Administered 2013-10-04: 30 mL via ORAL
  Filled 2013-10-01: qty 30

## 2013-10-01 MED ORDER — METHOCARBAMOL 500 MG PO TABS: 500.0000 mg | ORAL_TABLET | Freq: Four times a day (QID) | ORAL | Status: DC | PRN

## 2013-10-01 MED ORDER — ONDANSETRON HCL 4 MG PO TABS: 4.0000 mg | ORAL_TABLET | Freq: Three times a day (TID) | ORAL | Status: DC | PRN

## 2013-10-01 MED ORDER — NORTRIPTYLINE HCL 10 MG PO CAPS
10.0000 mg | ORAL_CAPSULE | Freq: Every day | ORAL | Status: DC
  Administered 2013-10-02 – 2013-10-03 (×2): 10 mg via ORAL
  Filled 2013-10-01 (×4): qty 1

## 2013-10-01 MED ORDER — METOCLOPRAMIDE HCL 5 MG/ML IJ SOLN
5.0000 mg | Freq: Three times a day (TID) | INTRAMUSCULAR | Status: DC | PRN
  Administered 2013-10-01: 5 mg via INTRAVENOUS
  Filled 2013-10-01: qty 2

## 2013-10-01 MED ORDER — FENTANYL CITRATE 0.05 MG/ML IJ SOLN
INTRAMUSCULAR | Status: DC | PRN
  Administered 2013-10-01: 150 ug via INTRAVENOUS
  Administered 2013-10-01 (×2): 50 ug via INTRAVENOUS

## 2013-10-01 MED ORDER — SENNA 8.6 MG PO TABS
1.0000 | ORAL_TABLET | Freq: Two times a day (BID) | ORAL | Status: DC
  Administered 2013-10-02 – 2013-10-04 (×5): 8.6 mg via ORAL
  Filled 2013-10-01 (×7): qty 1

## 2013-10-01 MED ORDER — ISOSORBIDE MONONITRATE ER 30 MG PO TB24
30.0000 mg | ORAL_TABLET | Freq: Every day | ORAL | Status: DC
  Administered 2013-10-03: 30 mg via ORAL
  Filled 2013-10-01 (×4): qty 1

## 2013-10-01 MED ORDER — METOPROLOL TARTRATE 25 MG PO TABS
25.0000 mg | ORAL_TABLET | Freq: Two times a day (BID) | ORAL | Status: DC
  Administered 2013-10-03 (×2): 25 mg via ORAL
  Filled 2013-10-01 (×7): qty 1

## 2013-10-01 MED ORDER — ACETAMINOPHEN 650 MG RE SUPP: 650.0000 mg | Freq: Four times a day (QID) | RECTAL | Status: DC | PRN

## 2013-10-01 MED ORDER — CLOPIDOGREL BISULFATE 75 MG PO TABS
75.0000 mg | ORAL_TABLET | Freq: Every day | ORAL | Status: DC
  Administered 2013-10-01 – 2013-10-04 (×4): 75 mg via ORAL
  Filled 2013-10-01 (×5): qty 1

## 2013-10-01 MED ORDER — SODIUM CHLORIDE 0.9 % IV BOLUS (SEPSIS)
500.0000 mL | Freq: Once | INTRAVENOUS | Status: AC
  Administered 2013-10-01: 500 mL via INTRAVENOUS

## 2013-10-01 MED ORDER — PHENOL 1.4 % MT LIQD: 1.0000 | OROMUCOSAL | Status: DC | PRN

## 2013-10-01 MED ORDER — METHOCARBAMOL 1000 MG/10ML IJ SOLN: 500.0000 mg | Freq: Four times a day (QID) | INTRAVENOUS | Status: DC | PRN

## 2013-10-01 MED ORDER — MENTHOL 3 MG MT LOZG: 1.0000 | LOZENGE | OROMUCOSAL | Status: DC | PRN

## 2013-10-01 MED ORDER — ACETAMINOPHEN 325 MG PO TABS: 650.0000 mg | ORAL_TABLET | Freq: Four times a day (QID) | ORAL | Status: DC | PRN

## 2013-10-01 MED ORDER — ONDANSETRON HCL 4 MG/2ML IJ SOLN
INTRAMUSCULAR | Status: AC
  Filled 2013-10-01: qty 2

## 2013-10-01 MED ORDER — FENTANYL CITRATE 0.05 MG/ML IJ SOLN
INTRAMUSCULAR | Status: AC
  Filled 2013-10-01: qty 5

## 2013-10-01 MED ORDER — ALUM & MAG HYDROXIDE-SIMETH 200-200-20 MG/5ML PO SUSP
30.0000 mL | ORAL | Status: DC | PRN
  Administered 2013-10-04: 30 mL via ORAL
  Filled 2013-10-01: qty 30

## 2013-10-01 MED ORDER — OXYCODONE HCL 5 MG PO TABS: 5.0000 mg | ORAL_TABLET | ORAL | Status: DC | PRN

## 2013-10-01 MED ORDER — NEOSTIGMINE METHYLSULFATE 10 MG/10ML IV SOLN
INTRAVENOUS | Status: DC | PRN
  Administered 2013-10-01: 3 mg via INTRAVENOUS

## 2013-10-01 MED ORDER — HYDROCODONE-ACETAMINOPHEN 5-325 MG PO TABS: 1.0000 | ORAL_TABLET | Freq: Four times a day (QID) | ORAL | Status: DC | PRN

## 2013-10-01 MED ORDER — NITROGLYCERIN 0.4 MG SL SUBL: 0.4000 mg | SUBLINGUAL_TABLET | SUBLINGUAL | Status: DC | PRN

## 2013-10-01 MED ORDER — PROPOFOL 10 MG/ML IV BOLUS
INTRAVENOUS | Status: AC
  Filled 2013-10-01: qty 20

## 2013-10-01 MED ORDER — ENOXAPARIN SODIUM 40 MG/0.4ML ~~LOC~~ SOLN
40.0000 mg | SUBCUTANEOUS | Status: DC
  Administered 2013-10-02 – 2013-10-03 (×2): 40 mg via SUBCUTANEOUS
  Filled 2013-10-01 (×4): qty 0.4

## 2013-10-01 SURGICAL SUPPLY — 53 items
BLADE 10 SAFETY STRL DISP (BLADE) ×3 IMPLANT
BLADE SAGITTAL (BLADE) ×3
BLADE SAW THK.89X75X18XSGTL (BLADE) ×1 IMPLANT
CAPT HIP FX BIPOLAR/UNIPOLAR ×2 IMPLANT
COVER SURGICAL LIGHT HANDLE (MISCELLANEOUS) ×3 IMPLANT
DRAPE HIP W/POCKET STRL (DRAPE) ×3 IMPLANT
DRAPE INCISE IOBAN 66X45 STRL (DRAPES) ×2 IMPLANT
DRAPE INCISE IOBAN 85X60 (DRAPES) ×6 IMPLANT
DRAPE ORTHO SPLIT 77X108 STRL (DRAPES) ×6
DRAPE PROXIMA HALF (DRAPES) ×2 IMPLANT
DRAPE SURG ORHT 6 SPLT 77X108 (DRAPES) IMPLANT
DRAPE U-SHAPE 47X51 STRL (DRAPES) ×3 IMPLANT
DRSG AQUACEL AG ADV 3.5X10 (GAUZE/BANDAGES/DRESSINGS) ×3 IMPLANT
DRSG MEPILEX BORDER 4X8 (GAUZE/BANDAGES/DRESSINGS) ×3 IMPLANT
DURAPREP 26ML APPLICATOR (WOUND CARE) ×3 IMPLANT
ELECT BLADE 6.5 EXT (BLADE) IMPLANT
ELECT CAUTERY BLADE 6.4 (BLADE) ×3 IMPLANT
ELECT REM PT RETURN 9FT ADLT (ELECTROSURGICAL) ×3
ELECTRODE REM PT RTRN 9FT ADLT (ELECTROSURGICAL) ×1 IMPLANT
EVACUATOR 1/8 PVC DRAIN (DRAIN) ×2 IMPLANT
FACESHIELD WRAPAROUND (MASK) IMPLANT
FACESHIELD WRAPAROUND OR TEAM (MASK) IMPLANT
GLOVE BIOGEL PI IND STRL 6.5 (GLOVE) IMPLANT
GLOVE BIOGEL PI IND STRL 7.5 (GLOVE) ×1 IMPLANT
GLOVE BIOGEL PI IND STRL 8 (GLOVE) ×1 IMPLANT
GLOVE BIOGEL PI INDICATOR 6.5 (GLOVE) ×4
GLOVE BIOGEL PI INDICATOR 7.5 (GLOVE) ×2
GLOVE BIOGEL PI INDICATOR 8 (GLOVE) ×2
GLOVE ORTHO TXT STRL SZ7.5 (GLOVE) ×6 IMPLANT
GLOVE SURG SS PI 6.5 STRL IVOR (GLOVE) ×2 IMPLANT
GLOVE SURG SS PI 7.5 STRL IVOR (GLOVE) ×2 IMPLANT
GOWN STRL REUS W/ TWL LRG LVL3 (GOWN DISPOSABLE) ×2 IMPLANT
GOWN STRL REUS W/ TWL XL LVL3 (GOWN DISPOSABLE) ×2 IMPLANT
GOWN STRL REUS W/TWL LRG LVL3 (GOWN DISPOSABLE) ×6
GOWN STRL REUS W/TWL XL LVL3 (GOWN DISPOSABLE) ×6
KIT BASIN OR (CUSTOM PROCEDURE TRAY) ×3 IMPLANT
KIT ROOM TURNOVER OR (KITS) ×3 IMPLANT
MANIFOLD NEPTUNE II (INSTRUMENTS) ×3 IMPLANT
NS IRRIG 1000ML POUR BTL (IV SOLUTION) ×3 IMPLANT
PACK TOTAL JOINT (CUSTOM PROCEDURE TRAY) ×3 IMPLANT
PAD ARMBOARD 7.5X6 YLW CONV (MISCELLANEOUS) ×6 IMPLANT
SEALER BIPOLAR AQUA 6.0 (INSTRUMENTS) ×2 IMPLANT
STAPLER VISISTAT 35W (STAPLE) ×3 IMPLANT
SUT ETHIBOND 2 V 37 (SUTURE) ×6 IMPLANT
SUT PDS AB 1 CT  36 (SUTURE) ×4
SUT PDS AB 1 CT 36 (SUTURE) IMPLANT
SUT VIC AB 1 CTB1 27 (SUTURE) ×6 IMPLANT
SUT VIC AB 2-0 CT1 27 (SUTURE) ×6
SUT VIC AB 2-0 CT1 TAPERPNT 27 (SUTURE) ×2 IMPLANT
TOWEL OR 17X24 6PK STRL BLUE (TOWEL DISPOSABLE) ×3 IMPLANT
TOWEL OR 17X26 10 PK STRL BLUE (TOWEL DISPOSABLE) ×3 IMPLANT
TRAY FOLEY CATH 16FRSI W/METER (SET/KITS/TRAYS/PACK) IMPLANT
WATER STERILE IRR 1000ML POUR (IV SOLUTION) ×3 IMPLANT

## 2013-10-01 NOTE — Anesthesia Postprocedure Evaluation (Signed)
  Anesthesia Post-op Note  Patient: Alexis Higgins  Procedure(s) Performed: Procedure(s): ARTHROPLASTY UNIPOLAR   HIP (Left)  Patient Location: PACU  Anesthesia Type:General  Level of Consciousness: awake, alert , oriented and patient cooperative  Airway and Oxygen Therapy: Patient Spontanous Breathing and Patient connected to nasal cannula oxygen  Post-op Pain: none  Post-op Assessment: Post-op Vital signs reviewed, Patient's Cardiovascular Status Stable, Respiratory Function Stable, Patent Airway, No signs of Nausea or vomiting and Pain level controlled  Post-op Vital Signs: Reviewed and stable  Last Vitals:  Filed Vitals:   10/01/13 1838  BP:   Pulse:   Temp: 36.7 C  Resp:     Complications: No apparent anesthesia complications

## 2013-10-01 NOTE — Progress Notes (Signed)
Note: This document was prepared with digital dictation and possible smart phrase technology. Any transcriptional errors that result from this process are unintentional.   Alexis Higgins [id number] DOB: 05-28-1940 DOA: 09/30/2013 PCP: Alexis Cower, MD  Brief narrative: 73 y/o ?, known h/o severe COPD, CADon Plavix, hypertension-admitted with mechanical fallafter turning quickly losing balance. This is in the fourth fall probably in the last couple of months. She was actually ambulating with her oxygen and may follow Alexis Higgins and her cassette.  Past medical history-As per Problem list Chart reviewed as below- review  Consultants:  orthopedics  Procedures:  Unipolar arthroplasty 5/11  Antibiotics:  Intraoperative Ancef   Subjective  Doing fair. Pain 7/10 N.p.o. For procedure when I saw her. No chest pain no shortness of breath other than out of the ordinary, no cough, no chills, no fever, no sputum   Objective    Interim History: none  Telemetry: none   Objective: Filed Vitals:   09/30/13 1930 09/30/13 2051 10/01/13 0021 10/01/13 0551  BP: 147/73 137/69 130/59 119/57  Pulse: 86 88 85 78  Temp:  98.8 F (37.1 C) 100.2 F (37.9 C) 98.4 F (36.9 C)  TempSrc:  Oral Oral Oral  Resp: 18 18 18 18   SpO2: 100% 99% 99% 99%    Intake/Output Summary (Last 24 hours) at 10/01/13 1236 Last data filed at 10/01/13 0617  Gross per 24 hour  Intake    690 ml  Output    400 ml  Net    290 ml    Exam:  General: fair EOMI NCAT Cardiovascular: S1-S2 no murmur rub or gallop Respiratory: clinically clear no added sounds Abdomen: soft nontender nondistended area Skinextremity rotated left hip Neurointact  Data Reviewed: Basic Metabolic Panel:  Recent Labs Lab 09/30/13 1714 10/01/13 0528  NA 139 140  K 3.6* 3.9  CL 99 102  CO2 32 28  GLUCOSE 100* 97  BUN 7 7  CREATININE 0.80 0.85  CALCIUM 9.3 8.3*   Liver Function Tests: No results found for this basename:  AST, ALT, ALKPHOS, BILITOT, PROT, ALBUMIN,  in the last 168 hours No results found for this basename: LIPASE, AMYLASE,  in the last 168 hours No results found for this basename: AMMONIA,  in the last 168 hours CBC:  Recent Labs Lab 09/30/13 1714 10/01/13 0528  WBC 8.4 6.9  HGB 11.8* 10.5*  HCT 36.8 32.5*  MCV 84.4 84.2  PLT 244 205   Cardiac Enzymes: No results found for this basename: CKTOTAL, CKMB, CKMBINDEX, TROPONINI,  in the last 168 hours BNP: No components found with this basename: POCBNP,  CBG: No results found for this basename: GLUCAP,  in the last 168 hours  Recent Results (from the past 240 hour(s))  SURGICAL PCR SCREEN     Status: None   Collection Time    10/01/13  1:07 AM      Result Value Ref Range Status   MRSA, PCR NEGATIVE  NEGATIVE Final   Staphylococcus aureus NEGATIVE  NEGATIVE Final   Comment:            The Xpert SA Assay (FDA     approved for NASAL specimens     in patients over 50 years of age),     is one component of     a comprehensive surveillance     program.  Test performance has     been validated by [employer] for patients greater  than or equal to 32 year old.     It is not intended     to diagnose infection nor to     guide or monitor treatment.     Studies:              All Imaging reviewed and is as per above notation   Scheduled Meds: .  ceFAZolin (ANCEF) IV  2 g Intravenous On Call to OR  . chlorhexidine  60 mL Topical Once   Continuous Infusions: . sodium chloride       Assessment/Plan: 1. Displaced fracture of left thumb on neck status post left unipolar arthroplasty. Full weightbearing,posterior hip precautions, orthopedics to determine the best anticoagulation strategy--for now i will reorder ASA 81 and Plavix 75 daily. The probably need skilled nursing care she is now able to mobilize quickly. Pain control ordered = morphine 0.5 mg every 2, Percocet 1-2 every 6 when necessary-we'll try to minimize Robaxin  500 every 6 when necessary--May need elevated dosing of vitamin D as was on 1000 every afternoon and still had a fracture 2. Gold COPD stage III, O2 dependent he stable at present time-continue Spiriva 1 capsule daily, continue Symbicort twice a day 3. History of transient ischemic attack-stableis 4. History of head contusion with this fall-patient was on Plavix however she has not lost consciousness or or any other issues currently. Monitor 5. Hypertension -her metoprolol has not been reordered we will reimplemet this once twice a day. We will also continue the Imdur 0.5 mg daily. 6. Depression-continue Pamelor 10 mg each bedtime 7. Constipation. Hold off on MiraLax for right now but may need to be reimplanted this admit  Code Status: FULL Family Communication: discussed with family at bedside Disposition Plan: inpatient   Verneita Griffes, MD  [facility] Pager [phone] 10/01/2013, 12:36 PM    LOS: 1 day

## 2013-10-01 NOTE — Anesthesia Procedure Notes (Signed)
Procedure Name: Intubation Date/Time: 10/01/2013 3:22 PM Performed by: Manuela Schwartz B Pre-anesthesia Checklist: Patient identified, Emergency Drugs available, Suction available, Patient being monitored and Timeout performed Patient Re-evaluated:Patient Re-evaluated prior to inductionOxygen Delivery Method: Circle system utilized Preoxygenation: Pre-oxygenation with 100% oxygen Intubation Type: IV induction Ventilation: Mask ventilation without difficulty Laryngoscope Size: Mac and 3 Grade View: Grade I Tube type: Oral Tube size: 7.5 mm Number of attempts: 1 Airway Equipment and Method: Stylet Placement Confirmation: ETT inserted through vocal cords under direct vision,  positive ETCO2 and breath sounds checked- equal and bilateral Secured at: 21 cm Tube secured with: Tape Dental Injury: Teeth and Oropharynx as per pre-operative assessment

## 2013-10-01 NOTE — Discharge Instructions (Signed)
1. Remove dressing on 10/08/13.  2. May get incision wet while showering after day 10 after surgery.  Do not submerge incision until notified. 3. WBAT 4. Ice the operative 20 minutes on and 20 minutes off around the clock.  5. Avoid any dental visits including dental cleanings for 6 months after surgery. 6. Take medicines as prescribed

## 2013-10-01 NOTE — Op Note (Signed)
ARTHROPLASTY UNIPOLAR   HIP  Alexis Higgins   [id number]  Pre-op Diagnosis: Left Hip Fracture     Post-op Diagnosis: same   Operative Procedures  1. Open treatment of femoral fracture, proximal end, neck, prosthetic replacement CPT [phone]  Personnel  Surgeon(s): Naiping Eduard Roux, MD   Anesthesia: spinal, epidural   Prosthesis: DePuy Femur: Corail KA Head: 47 mm size: -3 Bearing Type: Monopolar  Date of Service: 10/01/2013  Indication: 73 y.o.. year old female who suffered a mechanical fall and was found to have sustained a left hip fracture. Prior to the fall she had mild groin pain secondary to baseline hip osteoarthritis. She was found to have a hip fracture that was appropriate for operative management. We reviewed the risk and benefits with the patient and family and they elected to proceed.  Procedure:  After informed consent was obtained and understanding of the risk were voiced including but not limited to bleeding, infection, damage to surrounding structures including nerves and vessels, blood clots, leg length inequality, dislocation and the failure to achieve desired results, including death, the operative extremity was marked with verbal confirmation of the patient in the holding area.   The patient was then brought to the operating room and transported to the operating room table and placed in the lateral decubitus position. The operative limb was then prepped and draped in the usual sterile fashion and preoperative antibiotics were administered.  A time out was performed prior to the start of surgery confirming the correct extremity, preoperative antibiotic administration, as well as team members, and that implants and instruments available for the case. Correct surgical site was also confirmed with preoperative radiographs. A standard posterior approach to the hip was performed. The piriformis tendon was tagged for later repair.  A capsulotomy was performed and the  capsule tagged for later repair. The labrum was preserved. The hip was dislocated and the femoral neck cut was made at the length approximately 1 finger breadth above the lesser trochanter. We then used a corkscrew and cobb to remove the femoral head from the acetabulum. We measured the head and proceeded to trial head sizes and found 47 mm to the the appropriate size.  The calcar was inspected and did not demonstrate a fracture.   We then turned our attention to femoral preparation. We began sequential broaching to a size 12 stem. This size produced good fit and rotational stability. We placed the trial neck and a 47 mm -3 head.  Leg lengths were evaluated on the table and determined to be symmetric. The hip was stable in extension and external rotation without impingement. The hip was stable in deep flexion. In 90 degrees of flexion and neutral abduction the hip was stable to 85 degrees of internal rotation. In a position of sleep with hip adducted across the body the hip was stable to 75 degrees of rotation.  We then turned back to the femur and tested the broach for stability and fit, and removed the trial broach. The final femoral implant was placed, and the trial head was again trialed for stability. We then irrigated and dried the trunion, and the final head was placed. We placed an 47 mm -3 head. The hip was again reduced, and taken through a range of motion and found to be stable as above. The hip was thoroughly irrigated, and a posterior capsule and piriformis were performed with #2 Ethibond. The wound was irrigated with normal saline. A medium hemovac was placed below  the fascia.  The deep fascia was closed with 1 PDS, 0 vicryl for the deep fat layer, and 2.0 Vicryl Plus for the subcutaneous tissue. The skin was closed with staples. A sterile dressing was applied. The patient was awakened and transported to the recovery room in stable condition. All sponge, needle, and instrument counts were correct  at the end of the case.  Position: lateral decubitus   Complications: none.  Time Out: performed   Drains/Packing: 1 hemovac  Estimated blood loss: 250 cc  Returned to Recovery Room: in good condition.   Antibiotics: yes   Mechanical VTE (DVT) Prophylaxis: sequential compression devices, TED thigh-high  Chemical VTE (DVT) Prophylaxis: lovenox 40 mg daily  Fluid Replacement  Crystalloid: see anesthesia record Blood: none  FFP: none   Specimens Removed: 1 to pathology   Sponge and Instrument Count Correct? yes   PACU: portable radiograph - low AP pelvis  Admission: inpatient status, start PT & OT POD#1  Plan/RTC: Return in 2 weeks for wound check.  Weight Bearing/Load Lower Extremity: full  Posterior hip precautions  N. Eduard Roux, MD [place] 5:23 PM

## 2013-10-01 NOTE — Anesthesia Preprocedure Evaluation (Addendum)
Anesthesia Evaluation  Patient identified by MRN, date of birth, ID band Patient awake    Reviewed: Allergy & Precautions, H&P , NPO status , Patient's Chart, lab work & pertinent test results, reviewed documented beta blocker date and time   History of Anesthesia Complications Negative for: history of anesthetic complications  Airway Mallampati: II TM Distance: >3 FB Neck ROM: full    Dental  (+) Edentulous Upper, Edentulous Lower, Dental Advisory Given   Pulmonary shortness of breath and with exertion, pneumonia -, resolved, COPD COPD inhaler and oxygen dependent, former smoker,  breath sounds clear to auscultation        Cardiovascular hypertension, On Medications and On Home Beta Blockers + angina + CAD, + Past MI and + Cardiac Stents negative cardio ROS  Rhythm:regular     Neuro/Psych  Headaches, PSYCHIATRIC DISORDERS    GI/Hepatic negative GI ROS, Neg liver ROS,   Endo/Other  negative endocrine ROS  Renal/GU negative Renal ROS  negative genitourinary   Musculoskeletal   Abdominal   Peds  Hematology negative hematology ROS (+)   Anesthesia Other Findings See surgeon's H&P   Reproductive/Obstetrics negative OB ROS                          Anesthesia Physical Anesthesia Plan  ASA: III  Anesthesia Plan: General   Post-op Pain Management:    Induction: Intravenous  Airway Management Planned: Oral ETT  Additional Equipment:   Intra-op Plan:   Post-operative Plan: Extubation in OR  Informed Consent: I have reviewed the patients History and Physical, chart, labs and discussed the procedure including the risks, benefits and alternatives for the proposed anesthesia with the patient or authorized representative who has indicated his/her understanding and acceptance.   Dental Advisory Given  Plan Discussed with: CRNA and Surgeon  Anesthesia Plan Comments:         Anesthesia  Quick Evaluation

## 2013-10-01 NOTE — H&P (Signed)

## 2013-10-01 NOTE — Transfer of Care (Signed)
Immediate Anesthesia Transfer of Care Note  Patient: Alexis Higgins  Procedure(s) Performed: Procedure(s): ARTHROPLASTY UNIPOLAR   HIP (Left)  Patient Location: PACU  Anesthesia Type:General  Level of Consciousness: awake, alert  and oriented  Airway & Oxygen Therapy: Patient Spontanous Breathing and Patient connected to nasal cannula oxygen  Post-op Assessment: Report given to PACU RN and Post -op Vital signs reviewed and stable  Post vital signs: Reviewed and stable  Complications: No apparent anesthesia complications

## 2013-10-02 ENCOUNTER — Encounter (HOSPITAL_COMMUNITY): Payer: Self-pay | Admitting: General Practice

## 2013-10-02 ENCOUNTER — Inpatient Hospital Stay (HOSPITAL_COMMUNITY): Payer: Medicare Other

## 2013-10-02 LAB — CBC
HEMATOCRIT: 24.3 % — AB (ref 36.0–46.0)
Hemoglobin: 8.2 g/dL — ABNORMAL LOW (ref 12.0–15.0)
MCH: 27.5 pg (ref 26.0–34.0)
MCHC: 33.3 g/dL (ref 30.0–36.0)
MCV: 82.4 fL (ref 78.0–100.0)
Platelets: 216 10*3/uL (ref 150–400)
RBC: 2.95 MIL/uL — ABNORMAL LOW (ref 3.87–5.11)
RDW: 13.4 % (ref 11.5–15.5)
WBC: 11.8 10*3/uL — ABNORMAL HIGH (ref 4.0–10.5)

## 2013-10-02 LAB — CBC WITH DIFFERENTIAL/PLATELET
Basophils Absolute: 0 10*3/uL (ref 0.0–0.1)
Basophils Relative: 0 % (ref 0–1)
EOS ABS: 0 10*3/uL (ref 0.0–0.7)
EOS PCT: 0 % (ref 0–5)
HCT: 18.4 % — ABNORMAL LOW (ref 36.0–46.0)
Hemoglobin: 6.2 g/dL — CL (ref 12.0–15.0)
Lymphocytes Relative: 13 % (ref 12–46)
Lymphs Abs: 1.5 10*3/uL (ref 0.7–4.0)
MCH: 27.7 pg (ref 26.0–34.0)
MCHC: 33.7 g/dL (ref 30.0–36.0)
MCV: 82.1 fL (ref 78.0–100.0)
Monocytes Absolute: 1.8 10*3/uL — ABNORMAL HIGH (ref 0.1–1.0)
Monocytes Relative: 15 % — ABNORMAL HIGH (ref 3–12)
Neutro Abs: 8.4 10*3/uL — ABNORMAL HIGH (ref 1.7–7.7)
Neutrophils Relative %: 72 % (ref 43–77)
PLATELETS: 177 10*3/uL (ref 150–400)
RBC: 2.24 MIL/uL — ABNORMAL LOW (ref 3.87–5.11)
RDW: 13.9 % (ref 11.5–15.5)
WBC: 11.8 10*3/uL — ABNORMAL HIGH (ref 4.0–10.5)

## 2013-10-02 LAB — BASIC METABOLIC PANEL
BUN: 11 mg/dL (ref 6–23)
CALCIUM: 7 mg/dL — AB (ref 8.4–10.5)
CHLORIDE: 107 meq/L (ref 96–112)
CO2: 21 mEq/L (ref 19–32)
CREATININE: 0.75 mg/dL (ref 0.50–1.10)
GFR calc non Af Amer: 83 mL/min — ABNORMAL LOW (ref >90)
Glucose, Bld: 149 mg/dL — ABNORMAL HIGH (ref 70–99)
Potassium: 4.5 mEq/L (ref 3.7–5.3)
Sodium: 139 mEq/L (ref 137–147)

## 2013-10-02 LAB — PREPARE RBC (CROSSMATCH)

## 2013-10-02 LAB — BLOOD PRODUCT ORDER (VERBAL) VERIFICATION

## 2013-10-02 MED ORDER — ACETAMINOPHEN 500 MG PO TABS
500.0000 mg | ORAL_TABLET | Freq: Once | ORAL | Status: AC
  Administered 2013-10-02: 500 mg via ORAL
  Filled 2013-10-02: qty 1

## 2013-10-02 MED ORDER — MORPHINE SULFATE 2 MG/ML IJ SOLN: 0.5000 mg | INTRAMUSCULAR | Status: DC | PRN

## 2013-10-02 NOTE — Evaluation (Signed)
Physical Therapy Evaluation Patient Details Name: HEBA IGE MRN: [id number] DOB: 09-Apr-1941 Today's Date: 10/02/2013   History of Present Illness  73 y.o. female admitted after fall with displaced L femoral neck fx, s/p L posterior THA on 10/01/13, pt also has COPD and CAD   Clinical Impression  Pt did not tolerate therapy well today due to pain and nausea.  She required VCing for bed mobility and assistance to move her L LE and bring trunk up to vertical, as well as max assist to stand from sitting.  Pt was able to perform quad sets and ankle pumps in seated position.  Patient will benefit from continued physical therapy and recommend placement in CIR for further rehab prior to returning to home with husband.  Will continue to follow.    Follow Up Recommendations CIR    Equipment Recommendations  Rolling walker with 5" wheels;3in1 (PT)    Recommendations for Other Services       Precautions / Restrictions Precautions Precautions: Posterior Hip;Fall Precaution Booklet Issued: Yes (comment) Precaution Comments: Pt given posterior hip precaution sheet with verbal discussion of precautions. Restrictions Other Position/Activity Restrictions: WBAT      Mobility  Bed Mobility Overal bed mobility: Needs Assistance Bed Mobility: Supine to Sit     Supine to sit: Mod assist; +2 for physical assistance; HOB elevated     General bed mobility comments: Pt required assistance to move L LE over bed, VCing for hand placement and use of bed rails, and assistance to bring trunk to vertical.  Transfers Overall transfer level: Needs assistance Equipment used: None Transfers: Sit to/from [employer] Transfers Sit to Stand: Max assist; +2 for physical assistance Stand pivot transfers: Mod assist       General transfer comment: Pt required assistance to power up from sitting and VCing to continue pivoting toward chair before sitting again.  VCing provided for hand placement to  push off of bed and ease down into chair.  BP upon sitting was 149/81 with HR of 97 bpm, but pt was unable to relax her arm during this measurement.  Ambulation/Gait                Stairs            Wheelchair Mobility    Modified Rankin (Stroke Patients Only)       Balance Overall balance assessment: Needs assistance Sitting-balance support: Single extremity supported Sitting balance-Leahy Scale: Good Sitting balance - Comments: Pt reluctant to release hand hold on bed rail while sitting at EOB.   Standing balance support: Bilateral upper extremity supported Standing balance-Leahy Scale: Poor Standing balance comment: Pt required mod-A in standing due to pain/weakness.                             Pertinent Vitals/Pain Pt reported 8/10 pain.  Nurse was notified and administered pain meds during today's therapy.    Home Living Family/patient expects to be discharged to:: Inpatient rehab                 Additional Comments: Pt will benefit from further rehab before returning home with husband to their private residence.    Prior Function Level of Independence: Independent         Comments: Pt has hx of at least 4 falls within the past year.     Hand Dominance        Extremity/Trunk Assessment   Upper  Extremity Assessment: Defer to OT evaluation           Lower Extremity Assessment: LLE deficits/detail   LLE Deficits / Details: Decreased ROM and strength, increased pain     Communication   Communication: No difficulties  Cognition Arousal/Alertness: Awake/alert Behavior During Therapy: WFL for tasks assessed/performed Overall Cognitive Status: Within Functional Limits for tasks assessed                      General Comments      Exercises Total Joint Exercises Ankle Circles/Pumps: AROM;Both;10 reps;Seated Quad Sets: AROM;Left;5 reps;Seated      Assessment/Plan    PT Assessment Patient needs continued PT  services  PT Diagnosis Difficulty walking;Acute pain;Generalized weakness   PT Problem List Decreased strength;Decreased range of motion;Decreased balance;Decreased activity tolerance;Decreased mobility;Decreased knowledge of use of DME;Decreased knowledge of precautions;Pain  PT Treatment Interventions DME instruction;Gait training;Stair training;Functional mobility training;Therapeutic activities;Therapeutic exercise;Balance training;Patient/family education   PT Goals (Current goals can be found in the Care Plan section) Acute Rehab PT Goals Patient Stated Goal: None stated PT Goal Formulation: With patient/family Time For Goal Achievement: 10/12/13 Potential to Achieve Goals: Good    Frequency Min 3X/week   Barriers to discharge        Co-evaluation               End of Session Equipment Utilized During Treatment: Gait belt;Oxygen Activity Tolerance: Patient limited by pain Patient left: in chair;with call bell/phone within reach;with family/visitor present Nurse Communication: Mobility status;Patient requests pain meds         Time: 0852-0933 PT Time Calculation (min): 41 min   Charges:         PT G Codes:          Gunnard Dorrance, SPT 10/02/2013, 9:56 AM

## 2013-10-02 NOTE — Progress Notes (Signed)
   Subjective:  Patient reports pain as severe o/n but now better controlled.  Hypotensive o/n.  Objective:   VITALS:   Filed Vitals:   10/01/13 2316 10/01/13 2340 10/02/13 0122 10/02/13 0440  BP: 86/50 80/50 104/62 112/60  Pulse:   100 104  Temp:    98.7 F (37.1 C)  TempSrc:      Resp:    16  SpO2:   100% 98%    Neurologically intact Neurovascular intact Sensation intact distally Intact pulses distally Dorsiflexion/Plantar flexion intact Incision: dressing C/D/I and no drainage No cellulitis present Compartment soft HVAC in place   Lab Results  Component Value Date   WBC 11.8* 10/02/2013   HGB 8.2* 10/02/2013   HCT 24.3* 10/02/2013   MCV 82.4 10/02/2013   PLT 216 10/02/2013     Assessment/Plan:  1 Day Post-Op   - Expected postop acute blood loss anemia - will monitor for symptoms - Up with PT/OT - DVT ppx - SCDs, ambulation, lovenox - may restart asa and plavix - WBAT left lower extremity, posterior hip precautions - Pain control - HVAC pulled  Problem List Items Addressed This Visit     Cardiovascular and Mediastinum   HYPERTENSION   Relevant Medications      enoxaparin (LOVENOX) injection 40 mg      enoxaparin (LOVENOX) 40 MG/0.4ML injection      nitroGLYCERIN (NITROSTAT) SL tablet 0.4 mg      metoprolol tartrate (LOPRESSOR) tablet 25 mg      isosorbide mononitrate (IMDUR) 24 hr tablet 30 mg      aspirin EC tablet 81 mg     Respiratory   COPD GOLD III 02 dep   Relevant Medications      albuterol (PROVENTIL) (2.5 MG/3ML) 0.083% nebulizer solution 2.5 mg      tiotropium (SPIRIVA) inhalation capsule 18 mcg   Chronic respiratory failure     Musculoskeletal and Integument   Fracture of femoral neck, left, closed - Primary   Relevant Orders      Weight bearing as tolerated     Other   ANXIETY   Relevant Medications      nortriptyline (PAMELOR) capsule 10 mg    Other Visit Diagnoses   Fall            Marianna Payment 10/02/2013, 8:17  AM [phone]

## 2013-10-02 NOTE — Progress Notes (Signed)
Occupational Therapy Evaluation Patient Details Name: Alexis Higgins MRN: [id number] DOB: [birth year] Today's Date: 10/02/2013    History of Present Illness 73 y.o. female admitted after fall with displaced L femoral neck fx, s/p L posterior THA on 10/01/13, pt also has COPD and CAD   Clinical Impression   Patient presents to OT with decreased ADL independence and safety; will benefit from skilled OT to maximize independence. Patient having issues with pain control and nausea limiting today's session.     Follow Up Recommendations  CIR    Equipment Recommendations  Other (comment) (tbd at next venue of care)    Recommendations for Other Services Rehab consult     Precautions / Restrictions Precautions Precautions: Posterior Hip;Fall Precaution Booklet Issued: Yes (comment) Precaution Comments: Reviewed all posterior hip precautions with pt and pt's husband Restrictions Other Position/Activity Restrictions: WBAT      Mobility Bed Mobility   Transfers           Balance                            ADL Overall ADL's : Needs assistance/impaired Eating/Feeding: Set up   Grooming: Wash/dry hands;Wash/dry face;Brushing hair;Set up;Sitting   Upper Body Bathing: Minimal assitance;Sitting   Lower Body Bathing: Total assistance   Upper Body Dressing : Minimal assistance;Sitting   Lower Body Dressing: Total assistance                 General ADL Comments: Reviewed hip precautions and described AE to patient. She will benefit from AE for LB self-care. Patient did not wish to attempt mobility as she had just gotten to recliner with PT and had gotten nauseated. Patient did grooming activities in chair. Husband present. Discussed toilet heights and husband to install new toilets at their home. She has grab bars around toilet but none in her tub/shower.      Vision                     Perception     Praxis      Pertinent Vitals/Pain C/o pain,  had received morphine per patient and then got nauseated     Hand Dominance Right   Extremity/Trunk Assessment Upper Extremity Assessment Upper Extremity Assessment: Generalized weakness   Lower Extremity Assessment Lower Extremity Assessment: Defer to PT evaluation LLE Deficits / Details: Decreased ROM and strength, increased pain LLE: Unable to fully assess due to pain       Communication Communication Communication: No difficulties   Cognition Arousal/Alertness: Awake/alert Behavior During Therapy: WFL for tasks assessed/performed Overall Cognitive Status: Within Functional Limits for tasks assessed                     General Comments       Exercises      Shoulder Instructions      Home Living Family/patient expects to be discharged to:: Inpatient rehab                                 Additional Comments: Pt will benefit from further rehab before returning home with husband to their private residence.      Prior Functioning/Environment Level of Independence: Independent        Comments: Pt has hx of at least 4 falls within the past year.    OT Diagnosis: Acute pain;Generalized weakness  OT Problem List: Decreased strength;Decreased knowledge of use of DME or AE;Decreased safety awareness;Decreased knowledge of precautions;Pain   OT Treatment/Interventions: Self-care/ADL training;DME and/or AE instruction;Therapeutic exercise;Energy conservation;Therapeutic activities;Patient/family education    OT Goals(Current goals can be found in the care plan section) Acute Rehab OT Goals Patient Stated Goal: None stated OT Goal Formulation: With patient Time For Goal Achievement: 10/16/13 Potential to Achieve Goals: Good  OT Frequency: Min 3X/week   Barriers to D/C: Decreased caregiver support (husband works part time)          Co-evaluation              End of Session    Activity Tolerance: Patient limited by pain Patient  left: in chair;with call bell/phone within reach;with family/visitor present   Time: 4944-9675 OT Time Calculation (min): 22 min Charges:  OT General Charges $OT Visit: 1 Procedure OT Evaluation $Initial OT Evaluation Tier I: 1 Procedure OT Treatments $Self Care/Home Management : 8-22 mins G-Codes:    Alexis Higgins Alexis Higgins 16-Oct-2013, 10:21 AM

## 2013-10-02 NOTE — Progress Notes (Addendum)
Note: This document was prepared with digital dictation and possible smart phrase technology. Any transcriptional errors that result from this process are unintentional.   Alexis Higgins OJJ:[id number] DOB: November 29, 1940 DOA: 09/30/2013 PCP: Cathlean Cower, MD  Brief narrative: 73 y/o ?, known h/o severe COPD, CAD on Plavix, hypertension-admitted with mechanical fall after turning quickly losing balance. This is in the fourth fall probably in the last couple of months. She was actually ambulating with her oxygen and may have fallen on O2 tank.  Past medical history-As per Problem list Chart reviewed as below- review  Consultants:  orthopedics  Procedures:  Unipolar arthroplasty 5/11  Antibiotics:  Intraoperative Ancef   Subjective  Doing fair nauseous this morning with pain medication and did not work well with therapy Not really hungry Refusing urinary catheter removal as is and to much pain to ambulate Tolerating diet only poorly Denies chest pain or shortness of breath hasn't been Rates her pain maybe about 7 on 10    Objective    Interim History: Nursing reports patient's blood pressure dropped with IV morphine to 60/40 given 1 L fluid bolus overnight\  Telemetry: none   Objective: Filed Vitals:   10/02/13 1049 10/02/13 1200 10/02/13 1253 10/02/13 1600  BP: 112/67  106/53   Pulse: 128  126   Temp:   100 F (37.8 C)   TempSrc:   Oral   Resp:  18 18 18   SpO2:  96% 96% 96%    Intake/Output Summary (Last 24 hours) at 10/02/13 1637 Last data filed at 10/02/13 1405  Gross per 24 hour  Intake   3270 ml  Output    200 ml  Net   3070 ml    Exam:  General: fair EOMI NCAT Cardiovascular: S1-S2 no murmur rub or gallop Respiratory: clinically clear no added sounds Abdomen: soft nontender nondistended area Skinextremity rotated left hip Neurointact  Data Reviewed: Basic Metabolic Panel:  Recent Labs Lab 09/30/13 1714 10/01/13 0528 10/02/13 0500  NA  139 140 139  K 3.6* 3.9 4.5  CL 99 102 107  CO2 32 28 21  GLUCOSE 100* 97 149*  BUN 7 7 11   CREATININE 0.80 0.85 0.75  CALCIUM 9.3 8.3* 7.0*   Liver Function Tests: No results found for this basename: AST, ALT, ALKPHOS, BILITOT, PROT, ALBUMIN,  in the last 168 hours No results found for this basename: LIPASE, AMYLASE,  in the last 168 hours No results found for this basename: AMMONIA,  in the last 168 hours CBC:  Recent Labs Lab 09/30/13 1714 10/01/13 0528 10/02/13 0500  WBC 8.4 6.9 11.8*  HGB 11.8* 10.5* 8.2*  HCT 36.8 32.5* 24.3*  MCV 84.4 84.2 82.4  PLT 244 205 216   Cardiac Enzymes: No results found for this basename: CKTOTAL, CKMB, CKMBINDEX, TROPONINI,  in the last 168 hours BNP: No components found with this basename: POCBNP,  CBG: No results found for this basename: GLUCAP,  in the last 168 hours  Recent Results (from the past 240 hour(s))  SURGICAL PCR SCREEN     Status: None   Collection Time    10/01/13  1:07 AM      Result Value Ref Range Status   MRSA, PCR NEGATIVE  NEGATIVE Final   Staphylococcus aureus NEGATIVE  NEGATIVE Final   Comment:            The Xpert SA Assay (FDA     approved for NASAL specimens     in patients over  81 years of age),     is one component of     a comprehensive surveillance     program.  Test performance has     been validated by [employer] for patients greater     than or equal to 65 year old.     It is not intended     to diagnose infection nor to     guide or monitor treatment.     Studies:              All Imaging reviewed and is as per above notation   Scheduled Meds: . aspirin EC  81 mg Oral Daily  . clopidogrel  75 mg Oral Q breakfast  . enoxaparin (LOVENOX) injection  40 mg Subcutaneous Q24H  . isosorbide mononitrate  30 mg Oral Daily  . metoprolol tartrate  25 mg Oral BID  . nortriptyline  10 mg Oral QHS  . senna  1 tablet Oral BID  . tiotropium  18 mcg Inhalation Daily   Continuous Infusions: .  sodium chloride 125 mL/hr at 10/02/13 1306  . lactated ringers Stopped (10/02/13 0747)     Assessment/Plan: 1. Displaced fracture of left fem neck status post left unipolar arthroplasty 5/11. Full weightbearing, posterior hip precautions, Cont ASA 81 and Plavix 75 daily. The probably need skilled nursing care .  Pain control ordered = morphine 0.5 mg every 2 prn, Percocet 1-2 every 6 when necessary-minimize Robaxin 500 every 6 when necessary as nausea.  IS encoruaged 2. Hypotension in setting of IV morphine-Monitor 3. Gold COPD stage III, O2 dependent he stable at present time-continue Spiriva 1 capsule daily, continue Symbicort twice a day 4. History of transient ischemic attack-stable is 5. History of head contusion with this fall-patient was on Plavix however she has not lost consciousness or or any other issues currently. Monitor 6. Hypertension -her metoprolol has not been reordered we will reimplemet this once twice a day. We will also continue the Imdur 0.5 mg daily. 7. Depression-continue Pamelor 10 mg each bedtime 8. Constipation. Hold off on MiraLax for right now but may need to be reimplanted this admit  Code Status: FULL Family Communication: discussed with family at bedside Disposition Plan: inpatient   Verneita Griffes, MD  [facility] Pager [phone] 10/02/2013, 4:37 PM    LOS: 2 days

## 2013-10-03 DIAGNOSIS — S72009A Fracture of unspecified part of neck of unspecified femur, initial encounter for closed fracture: Secondary | ICD-10-CM

## 2013-10-03 DIAGNOSIS — W19XXXA Unspecified fall, initial encounter: Secondary | ICD-10-CM

## 2013-10-03 LAB — URINE CULTURE
COLONY COUNT: NO GROWTH
CULTURE: NO GROWTH

## 2013-10-03 LAB — CBC WITH DIFFERENTIAL/PLATELET
BASOS ABS: 0 10*3/uL (ref 0.0–0.1)
Basophils Relative: 0 % (ref 0–1)
EOS ABS: 0.3 10*3/uL (ref 0.0–0.7)
Eosinophils Relative: 2 % (ref 0–5)
HCT: 30.1 % — ABNORMAL LOW (ref 36.0–46.0)
Hemoglobin: 10.5 g/dL — ABNORMAL LOW (ref 12.0–15.0)
Lymphocytes Relative: 9 % — ABNORMAL LOW (ref 12–46)
Lymphs Abs: 1.1 10*3/uL (ref 0.7–4.0)
MCH: 29.1 pg (ref 26.0–34.0)
MCHC: 34.9 g/dL (ref 30.0–36.0)
MCV: 83.4 fL (ref 78.0–100.0)
MONO ABS: 1.5 10*3/uL — AB (ref 0.1–1.0)
Monocytes Relative: 12 % (ref 3–12)
NEUTROS PCT: 77 % (ref 43–77)
Neutro Abs: 9.7 10*3/uL — ABNORMAL HIGH (ref 1.7–7.7)
Platelets: 182 10*3/uL (ref 150–400)
RBC: 3.61 MIL/uL — ABNORMAL LOW (ref 3.87–5.11)
RDW: 13.8 % (ref 11.5–15.5)
WBC: 12.6 10*3/uL — ABNORMAL HIGH (ref 4.0–10.5)

## 2013-10-03 MED ORDER — PROMETHAZINE HCL 12.5 MG PO TABS
12.5000 mg | ORAL_TABLET | Freq: Four times a day (QID) | ORAL | Status: DC | PRN
  Administered 2013-10-03 – 2013-10-04 (×2): 12.5 mg via ORAL
  Filled 2013-10-03 (×2): qty 1

## 2013-10-03 MED ORDER — PROMETHAZINE HCL 25 MG/ML IJ SOLN
12.5000 mg | Freq: Four times a day (QID) | INTRAMUSCULAR | Status: DC | PRN
  Administered 2013-10-03: 12.5 mg via INTRAVENOUS
  Filled 2013-10-03: qty 1

## 2013-10-03 NOTE — Consult Note (Signed)
Physical Medicine and Rehabilitation Consult Reason for Consult: Left hip fracture Referring Physician: [facility]    HPI: Alexis Higgins is a 73 y.o. right-handed female with history of COPD with a home oxygen, coronary artery disease with PTCA maintained on Plavix. Independent prior to admission living with her husband. Admitted 09/30/2013 after mechanical fall landing on her left hip and struck her forehead. She denied any loss of consciousness or dizziness. Cranial CT scan with no acute intracranial abnormalities. X-rays and imaging revealed acute basicervical left femoral neck fracture. Underwent open treatment of femoral fracture, proximal and, neck, prosthetic replacement 10/01/2013 per Dr.Xu. Postoperative pain management. Patient is weightbearing as tolerated with posterior hip cautions. Acute blood loss anemia 6.2 plan transfusion. Physical therapy evaluation completed 10/02/2013 with recommendations for physical medicine rehabilitation consult.   Review of Systems  Respiratory: Positive for cough and shortness of breath.   Musculoskeletal: Positive for back pain and myalgias.  Neurological: Positive for dizziness.  Psychiatric/Behavioral:       Anxiety  All other systems reviewed and are negative.  Past Medical History  Diagnosis Date  . Impaired glucose tolerance 01/07/2011  . ANXIETY 01/01/2007  . BURSITIS, RIGHT HIP 06/04/2009  . CAD (coronary artery disease)     a. BMS to LAD 2010. b. NSTEMI with DES to LAD for ISR 2011. c. Patent stent 03/2012/Imdur added.  . CHEST PAIN-PRECORDIAL 01/15/2009  . COPD 01/01/2007    a. Chronic resp failure on home O2.  Marland Kitchen DEPRESSION 01/01/2007  . GROIN PAIN 06/20/2008  . Headache(784.0) 01/01/2007  . HYPERTENSION 01/01/2007  . LOW BACK PAIN 01/01/2007  . Muscle weakness (generalized) 06/04/2009  . OSTEOARTHRITIS, HIP 07/01/2008  . OSTEOPOROSIS 01/01/2007  . SYNCOPE 01/01/2007  . TRANSIENT ISCHEMIC ATTACK, HX OF 01/01/2007  . Eczema 01/08/2011    . Rosacea 01/08/2011  . Colon polyps     H/o tubular adenoma of colon  . Hemorrhoid   . Pneumonia 1998  . Cataract   . Abnormal chest x-ray     03/2012: will need OP f/u.  Marland Kitchen TIA (transient ischemic attack)   . Anginal pain   . Shortness of breath    Past Surgical History  Procedure Laterality Date  . Appendectomy    . Abdominal hysterectomy    . Oophorectomy      one ovary  . Sp lumbar disc surgury      [name]  . S/p bilat cataract  2010  . Coronary stent placement    . Colonoscopy  01/25/2002    tubular adenoma,hemorrhoids, hyperplastic  colon polyps  . Colonoscopy  02/17/2005    hemorrhoids  . Coronary angioplasty    . Back surgery    . Hip surgery Left     DR XU     PROXIMAL NECK    Family History  Problem Relation Age of Onset  . Osteoporosis Mother   . Heart disease Sister   . Colon cancer    . Seizures Sister     epilepsy  . Cardiomyopathy Sister   . Heart attack Sister    Social History:  reports that she quit smoking about 8 years ago. Her smoking use included Cigarettes. She has a 50 pack-year smoking history. She has never used smokeless tobacco. She reports that she does not drink alcohol or use illicit drugs. Allergies:  Allergies  Allergen Reactions  . Aspirin Other (See Comments)     cns bleed risk  . Codeine Rash   Medications  Prior to Admission  Medication Sig Dispense Refill  . aspirin EC 81 MG tablet Take 81 mg by mouth daily.      . budesonide-formoterol (SYMBICORT) 160-4.5 MCG/ACT inhaler Take 2 puffs first thing in am and then another 2 puffs about 12 hours later.  1 Inhaler  12  . Cholecalciferol (VITAMIN D) 1000 UNITS capsule Take 1,000 Units by mouth every evening.       . clopidogrel (PLAVIX) 75 MG tablet Take 1 tablet (75 mg total) by mouth daily.  30 tablet  11  . docusate sodium (COLACE) 100 MG capsule Take 100 mg by mouth daily as needed. For constipation      . isosorbide mononitrate (IMDUR) 30 MG 24 hr tablet TAKE 0.5  TABLETS (15 MG TOTAL) BY MOUTH DAILY.  15 tablet  6  . lovastatin (MEVACOR) 20 MG tablet Take 1 tablet (20 mg total) by mouth at bedtime.  90 tablet  3  . metoprolol tartrate (LOPRESSOR) 25 MG tablet TAKE 1/2 TABLET BY MOUTH 2 TIMES A DAY  30 tablet  5  . nortriptyline (PAMELOR) 10 MG capsule Take 10 mg by mouth.       . ondansetron (ZOFRAN) 4 MG tablet Take 4 mg by mouth 3 (three) times daily as needed. For nausea (take with tramadol)      . polyethylene glycol powder (MIRALAX) powder Take 17 g by mouth daily as needed. For constipation      . SPIRIVA HANDIHALER 18 MCG inhalation capsule INHALE 1 CAPSULE VIA HANDIHALER ONCE DAILY AT THE SAME TIME EVERY DAY  90 capsule  3  . tiotropium (SPIRIVA) 18 MCG inhalation capsule Place 18 mcg into inhaler and inhale daily.      . traMADol (ULTRAM) 50 MG tablet Take 100 mg by mouth 3 (three) times daily as needed. For pain      . nitroGLYCERIN (NITROSTAT) 0.4 MG SL tablet Place 1 tablet (0.4 mg total) under the tongue every 5 (five) minutes as needed for chest pain (up to 3 doses).  25 tablet  4    Home: Home Living Family/patient expects to be discharged to:: Private residence Living Arrangements: Spouse/significant other Additional Comments: Pt will benefit from further rehab before returning home with husband to their private residence.  Functional History: Prior Function Level of Independence: Independent Comments: Pt has hx of at least 4 falls within the past year. Functional Status:  Mobility: Bed Mobility Overal bed mobility: Needs Assistance Bed Mobility: Supine to Sit Supine to sit: Mod assist;HOB elevated;+2 for physical assistance General bed mobility comments: Pt required assistance to move L LE over bed, VCing for hand placement and use of bed rails, and assistance to bring trunk to vertical. Transfers Overall transfer level: Needs assistance Equipment used: None Transfers: Sit to/from [employer] Transfers Sit to Stand: Max  assist;+2 physical assistance Stand pivot transfers: Max assist General transfer comment: Pt required assistance to power up from sitting and VCing to continue pivoting toward chair before sitting again.  VCing provided for hand placement to push off of bed and ease down into chair.  BP upon sitting was 149/81 with HR of 97 bpm, but pt was unable to relax her arm during this measurement.      ADL: ADL Overall ADL's : Needs assistance/impaired Eating/Feeding: Set up Grooming: Wash/dry hands;Wash/dry face;Brushing hair;Set up;Sitting Upper Body Bathing: Minimal assitance;Sitting Lower Body Bathing: Total assistance Upper Body Dressing : Minimal assistance;Sitting Lower Body Dressing: Total assistance General ADL Comments: Reviewed hip  precautions and described AE to patient. She will benefit from AE for LB self-care. Patient did not wish to attempt mobility as she had just gotten to recliner with PT and had gotten nauseated. Patient did grooming activities in chair. Husband present. Discussed toilet heights and husband to install new toilets at their home. She has grab bars around toilet but none in her tub/shower.   Cognition: Cognition Overall Cognitive Status: Within Functional Limits for tasks assessed Orientation Level: Oriented X4 Cognition Arousal/Alertness: Awake/alert Behavior During Therapy: WFL for tasks assessed/performed Overall Cognitive Status: Within Functional Limits for tasks assessed  Blood pressure 130/62, pulse 104, temperature 98.6 F (37 C), temperature source Oral, resp. rate 18, SpO2 96.00%. Physical Exam  Vitals reviewed. Constitutional: She is oriented to person, place, and time.  73 year old frail Caucasian female  HENT:  Head: Normocephalic.  Eyes: EOM are normal.  Neck: Normal range of motion. Neck supple. No thyromegaly present.  Cardiovascular: Normal rate and regular rhythm.   Respiratory:  Decreased breath sounds but clear to auscultation. Wearing  oxygen [place]  GI: Soft. Bowel sounds are normal. She exhibits no distension.  Neurological: She is alert and oriented to person, place, and time.  Follows full commands. UE 4/5 prox to distal. RLE 3/5 hf, 4- KE, 3+ ADF and 4/5 APF. LLE 1/5 HF, KE and 4/5 at ankles. No sensory findings. dtr's 1+  Skin:  Hip incision dressed appropriately tender  Psychiatric: She has a normal mood and affect.    Results for orders placed during the hospital encounter of 09/30/13 (from the past 24 hour(s))  BLOOD PRODUCT ORDER (VERBAL) VERIFICATION     Status: None   Collection Time    10/02/13  7:21 PM      Result Value Ref Range   Blood product order confirm MD AUTHORIZATION REQUESTED    CBC WITH DIFFERENTIAL     Status: Abnormal   Collection Time    10/02/13  8:51 PM      Result Value Ref Range   WBC 11.8 (*) 4.0 - 10.5 K/uL   RBC 2.24 (*) 3.87 - 5.11 MIL/uL   Hemoglobin 6.2 (*) 12.0 - 15.0 g/dL   HCT 18.4 (*) 36.0 - 46.0 %   MCV 82.1  78.0 - 100.0 fL   MCH 27.7  26.0 - 34.0 pg   MCHC 33.7  30.0 - 36.0 g/dL   RDW 13.9  11.5 - 15.5 %   Platelets 177  150 - 400 K/uL   Neutrophils Relative % 72  43 - 77 %   Neutro Abs 8.4 (*) 1.7 - 7.7 K/uL   Lymphocytes Relative 13  12 - 46 %   Lymphs Abs 1.5  0.7 - 4.0 K/uL   Monocytes Relative 15 (*) 3 - 12 %   Monocytes Absolute 1.8 (*) 0.1 - 1.0 K/uL   Eosinophils Relative 0  0 - 5 %   Eosinophils Absolute 0.0  0.0 - 0.7 K/uL   Basophils Relative 0  0 - 1 %   Basophils Absolute 0.0  0.0 - 0.1 K/uL  PREPARE RBC (CROSSMATCH)     Status: None   Collection Time    10/02/13 10:30 PM      Result Value Ref Range   Order Confirmation ORDER PROCESSED BY BLOOD BANK    CBC WITH DIFFERENTIAL     Status: Abnormal (Preliminary result)   Collection Time    10/03/13  8:40 AM      Result Value Ref  Range   WBC 12.6 (*) 4.0 - 10.5 K/uL   RBC 3.61 (*) 3.87 - 5.11 MIL/uL   Hemoglobin 10.5 (*) 12.0 - 15.0 g/dL   HCT 30.1 (*) 36.0 - 46.0 %   MCV 83.4  78.0 - 100.0 fL    MCH 29.1  26.0 - 34.0 pg   MCHC 34.9  30.0 - 36.0 g/dL   RDW 13.8  11.5 - 15.5 %   Platelets 182  150 - 400 K/uL   Neutrophils Relative % 77  43 - 77 %   Lymphocytes Relative 9 (*) 12 - 46 %   Monocytes Relative 12  3 - 12 %   Eosinophils Relative 2  0 - 5 %   Basophils Relative 0  0 - 1 %   Neutro Abs 9.7 (*) 1.7 - 7.7 K/uL   Lymphs Abs 1.1  0.7 - 4.0 K/uL   Monocytes Absolute 1.5 (*) 0.1 - 1.0 K/uL   Eosinophils Absolute 0.3  0.0 - 0.7 K/uL   Basophils Absolute 0.0  0.0 - 0.1 K/uL   RBC Morphology POLYCHROMASIA PRESENT     Dg Chest 2 View  10/02/2013   CLINICAL DATA:  Fever.  EXAM: CHEST  2 VIEW  COMPARISON:  CT CHEST W/CM dated 06/01/2012; DG CHEST 2 VIEW dated 04/12/2012  FINDINGS: Normal heart size and pulmonary vascularity. Emphysematous changes and fibrosis in the lungs. No focal airspace disease or consolidation. No blunting of costophrenic angles. Bilateral breast implants.  IMPRESSION: Emphysematous changes and fibrosis in the lungs. No evidence of active pulmonary disease.   Electronically Signed   By: Lucienne Capers M.D.   On: 10/02/2013 22:26   Dg Pelvis Portable  10/01/2013   CLINICAL DATA:  Postop total left hip  EXAM: PORTABLE PELVIS 1-2 VIEWS  COMPARISON:  DG FEMUR*L*PORT dated 09/30/2013  FINDINGS: Interval postoperative changes with left hip hemiarthroplasty using non cemented components. Components appear well seated and there is no subluxation. No acute fractures are demonstrated. Skin clips and subcutaneous emphysema consistent with recent surgery. Degenerative changes in the right hip.  IMPRESSION: Left hip hemiarthroplasty.  Components appear well seated.   Electronically Signed   By: Lucienne Capers M.D.   On: 10/01/2013 21:39    Assessment/Plan: Diagnosis: left femoral neck fractures s/p left hip hemiarthroplasty, hx of right foot drop 1. Does the need for close, 24 hr/day medical supervision in concert with the patient's rehab needs make it unreasonable for this  patient to be served in a less intensive setting? Yes 2. Co-Morbidities requiring supervision/potential complications: oxygen dependent COPD, htn, depression 3. Due to bladder management, bowel management, safety, skin/wound care, disease management, medication administration, pain management and patient education, does the patient require 24 hr/day rehab nursing? Yes 4. Does the patient require coordinated care of a physician, rehab nurse, PT (1-2 hrs/day, 5 days/week) and OT (1-2 hrs/day, 5 days/week) to address physical and functional deficits in the context of the above medical diagnosis(es)? Yes Addressing deficits in the following areas: balance, endurance, locomotion, strength, transferring, bowel/bladder control, bathing, dressing, feeding, grooming, toileting and psychosocial support 5. Can the patient actively participate in an intensive therapy program of at least 3 hrs of therapy per day at least 5 days per week? Yes 6. The potential for patient to make measurable gains while on inpatient rehab is excellent 7. Anticipated functional outcomes upon discharge from inpatient rehab are modified independent and supervision  with PT, modified independent and supervision with OT, n/a with SLP. 8.  Estimated rehab length of stay to reach the above functional goals is: 12-14 days 9. Does the patient have adequate social supports to accommodate these discharge functional goals? Yes 10. Anticipated D/C setting: Home 11. Anticipated post D/C treatments: HH therapy and Outpatient therapy 12. Overall Rehab/Functional Prognosis: excellent  RECOMMENDATIONS: This patient's condition is appropriate for continued rehabilitative care in the following setting: CIR Patient has agreed to participate in recommended program. Yes and Potentially Note that insurance prior authorization may be required for reimbursement for recommended care.  Comment: Rehab Admissions Coordinator to follow up.  Thanks,  Meredith Staggers, MD, Mellody Drown     10/03/2013

## 2013-10-03 NOTE — Progress Notes (Signed)
Note: This document was prepared with digital dictation and possible smart phrase technology. Any transcriptional errors that result from this process are unintentional.   Alexis Higgins:[id number] DOB: Nov 02, 1940 DOA: 09/30/2013 PCP: Cathlean Cower, MD  Brief narrative: 73 y/o ?, known h/o severe COPD, CAD on Plavix, hypertension-admitted with mechanical fall after turning quickly losing balance. This is in the fourth fall probably in the last couple of months. She was actually ambulating with her oxygen and may have fallen on O2 tank.  Underwent left hip unipolar arthroplasty 8/11 Complications of nausea vomiting and fever 101.9 on 5/12 Noted anemia 6.2 hemoglobin and transfuse 2 units packed red blood cells 5/12  Past medical history-As per Problem list Chart reviewed as below- review  Consultants:  orthopedics  Procedures:  Unipolar arthroplasty 5/11  Antibiotics:  Intraoperative Ancef   Subjective   Less nausea. This is chronic and long-standing. Has needed to take Phenergan in past for this Tolerating some by mouth now Feels much better after transfusion No diarrhea No further fever   Objective    Interim History: Nursing reports fever 101.9 overnight-blood culture chest x-ray urine culture performed  Telemetry: none   Objective: Filed Vitals:   10/03/13 0300 10/03/13 0537 10/03/13 0630 10/03/13 0937  BP: 105/56 122/61 130/62   Pulse: 98 136 104   Temp: 98.4 F (36.9 C) 98.5 F (36.9 C) 98.6 F (37 C)   TempSrc: Oral  Oral   Resp: 18 18 18    SpO2: 100% 98% 100% 96%    Intake/Output Summary (Last 24 hours) at 10/03/13 1138 Last data filed at 10/03/13 0700  Gross per 24 hour  Intake 4013.75 ml  Output    850 ml  Net 3163.75 ml    Exam:  General: fair EOMI NCAT Cardiovascular: S1-S2 no murmur rub or gallop-slightly tachycardic Respiratory: clinically clear no added sounds,? Implants Abdomen: soft nontender nondistended area Skinextremity  rotated left hip Neur ointact  Data Reviewed: Basic Metabolic Panel:  Recent Labs Lab 09/30/13 1714 10/01/13 0528 10/02/13 0500  NA 139 140 139  K 3.6* 3.9 4.5  CL 99 102 107  CO2 32 28 21  GLUCOSE 100* 97 149*  BUN 7 7 11   CREATININE 0.80 0.85 0.75  CALCIUM 9.3 8.3* 7.0*   Liver Function Tests: No results found for this basename: AST, ALT, ALKPHOS, BILITOT, PROT, ALBUMIN,  in the last 168 hours No results found for this basename: LIPASE, AMYLASE,  in the last 168 hours No results found for this basename: AMMONIA,  in the last 168 hours CBC:  Recent Labs Lab 09/30/13 1714 10/01/13 0528 10/02/13 0500 10/02/13 2051 10/03/13 0840  WBC 8.4 6.9 11.8* 11.8* 12.6*  NEUTROABS  --   --   --  8.4* 9.7*  HGB 11.8* 10.5* 8.2* 6.2* 10.5*  HCT 36.8 32.5* 24.3* 18.4* 30.1*  MCV 84.4 84.2 82.4 82.1 83.4  PLT 244 205 216 177 182   Cardiac Enzymes: No results found for this basename: CKTOTAL, CKMB, CKMBINDEX, TROPONINI,  in the last 168 hours BNP: No components found with this basename: POCBNP,  CBG: No results found for this basename: GLUCAP,  in the last 168 hours  Recent Results (from the past 240 hour(s))  SURGICAL PCR SCREEN     Status: None   Collection Time    10/01/13  1:07 AM      Result Value Ref Range Status   MRSA, PCR NEGATIVE  NEGATIVE Final   Staphylococcus aureus NEGATIVE  NEGATIVE  Final   Comment:            The Xpert SA Assay (FDA     approved for NASAL specimens     in patients over 21 years of age),     is one component of     a comprehensive surveillance     program.  Test performance has     been validated by [employer] for patients greater     than or equal to 31 year old.     It is not intended     to diagnose infection nor to     guide or monitor treatment.     Studies:              All Imaging reviewed and is as per above notation   Scheduled Meds: . aspirin EC  81 mg Oral Daily  . clopidogrel  75 mg Oral Q breakfast  .  enoxaparin (LOVENOX) injection  40 mg Subcutaneous Q24H  . isosorbide mononitrate  30 mg Oral Daily  . metoprolol tartrate  25 mg Oral BID  . nortriptyline  10 mg Oral QHS  . senna  1 tablet Oral BID  . tiotropium  18 mcg Inhalation Daily   Continuous Infusions: . sodium chloride Stopped (10/03/13 0636)  . lactated ringers Stopped (10/02/13 0747)     Assessment/Plan: 1. Displaced fracture of left fem neck status post left unipolar arthroplasty 5/11. Full weightbearing, posterior hip precautions, Cont ASA 81 and Plavix 75 daily. CIR consult pending .  Pain control ordered = morphine 0.5 mg every 2 prn, Percocet 1-2 every 6 when necessary-minimize Robaxin 500 every 6 when necessary as nausea.  IS encouraged 2. Postoperative fever-blood culture urine culture still pending-chest x-ray showed no findings. Has no fever hold off on antibiotics for now. 3.  Acute blood loss anemia + dilutional component-transfused 2 units packed blood cells with appropriate response from 6.2 to 10.5. 4. Hypotension in setting of IV morphine, acute blood loss anemia-Monitor-resolved and much better 5. Gold COPD stage III, O2 dependent he stable at present time-continue Spiriva 1 capsule daily, continue Symbicort twice a day 6. History of transient ischemic attack-stable 7. History of head contusion with this fall-patient was on Plavix however she has not lost consciousness or or any other issues currently. Monitor 8. Hypertension-continue metoprolol 25 twice a day. We will also continue the Imdur 30 mg daily. 9. Depression-continue Pamelor 10 mg each bedtime 10. Constipation. Hold off on MiraLax for right now but may need to be reimplanted this admit  Code Status: FULL Family Communication: discussed with family at bedside Left message to discuss with daughter on her phone Disposition Plan: May need CIR-backup plan is skilled nursing facility   Verneita Griffes, MD  [facility] Pager [phone] 10/03/2013,  11:38 AM    LOS: 3 days

## 2013-10-03 NOTE — Progress Notes (Signed)
Subjective:  Patient reports pain as improved.  Received 2 units o/n.  Pain is better controlled  Objective:   VITALS:   Filed Vitals:   10/03/13 0215 10/03/13 0300 10/03/13 0537 10/03/13 0630  BP: 108/69 105/56 122/61 130/62  Pulse: 103 98 136 104  Temp: 98.6 F (37 C) 98.4 F (36.9 C) 98.5 F (36.9 C) 98.6 F (37 C)  TempSrc: Oral Oral  Oral  Resp: 19 18 18 18   SpO2: 97% 100% 98% 100%    Incision c/d/i No oozing from drain site or incision   Lab Results  Component Value Date   WBC 11.8* 10/02/2013   HGB 6.2* 10/02/2013   HCT 18.4* 10/02/2013   MCV 82.1 10/02/2013   PLT 177 10/02/2013     Assessment/Plan:  2 Days Post-Op   - Expected postop acute blood loss anemia - will monitor for symptoms, Hgb 6.2 - transfused 2 units - Up with PT/OT - recommend CIR - DVT ppx - SCDs, ambulation, lovenox - asa and plavix - WBAT left lower extremity, posterior hip precautions - Pain control - post transfusion CBC pending - dressing changed - CIR pending  Problem List Items Addressed This Visit     Cardiovascular and Mediastinum   HYPERTENSION   Relevant Medications      enoxaparin (LOVENOX) injection 40 mg      enoxaparin (LOVENOX) 40 MG/0.4ML injection      nitroGLYCERIN (NITROSTAT) SL tablet 0.4 mg      metoprolol tartrate (LOPRESSOR) tablet 25 mg      isosorbide mononitrate (IMDUR) 24 hr tablet 30 mg      aspirin EC tablet 81 mg     Respiratory   COPD GOLD III 02 dep   Relevant Medications      albuterol (PROVENTIL) (2.5 MG/3ML) 0.083% nebulizer solution 2.5 mg      tiotropium (SPIRIVA) inhalation capsule 18 mcg   Chronic respiratory failure     Musculoskeletal and Integument   Fracture of femoral neck, left, closed - Primary   Relevant Orders      Weight bearing as tolerated     Other   ANXIETY   Relevant Medications      nortriptyline (PAMELOR) capsule 10 mg    Other Visit Diagnoses   Fall            Marianna Payment 10/03/2013, 7:57  AM [phone]

## 2013-10-03 NOTE — Evaluation (Signed)
Agree with above assessment.  Kittie Plater, PT, DPT Pager #: [phone] Office #: [phone]

## 2013-10-04 LAB — TYPE AND SCREEN
ABO/RH(D): O POS
Antibody Screen: NEGATIVE
UNIT DIVISION: 0
UNIT DIVISION: 0
UNIT DIVISION: 0
Unit division: 0

## 2013-10-04 LAB — CBC
HEMATOCRIT: 26.7 % — AB (ref 36.0–46.0)
HEMOGLOBIN: 9.1 g/dL — AB (ref 12.0–15.0)
MCH: 28.5 pg (ref 26.0–34.0)
MCHC: 34.1 g/dL (ref 30.0–36.0)
MCV: 83.7 fL (ref 78.0–100.0)
Platelets: 189 10*3/uL (ref 150–400)
RBC: 3.19 MIL/uL — ABNORMAL LOW (ref 3.87–5.11)
RDW: 14.2 % (ref 11.5–15.5)
WBC: 10.7 10*3/uL — AB (ref 4.0–10.5)

## 2013-10-04 LAB — COMPREHENSIVE METABOLIC PANEL
ALBUMIN: 1.8 g/dL — AB (ref 3.5–5.2)
ALK PHOS: 66 U/L (ref 39–117)
AST: 18 U/L (ref 0–37)
BILIRUBIN TOTAL: 0.4 mg/dL (ref 0.3–1.2)
BUN: 13 mg/dL (ref 6–23)
CHLORIDE: 108 meq/L (ref 96–112)
CO2: 27 mEq/L (ref 19–32)
Calcium: 7.9 mg/dL — ABNORMAL LOW (ref 8.4–10.5)
Creatinine, Ser: 0.66 mg/dL (ref 0.50–1.10)
GFR calc Af Amer: >90 mL/min (ref >90)
GFR calc non Af Amer: 86 mL/min — ABNORMAL LOW (ref >90)
GLUCOSE: 94 mg/dL (ref 70–99)
POTASSIUM: 4.3 meq/L (ref 3.7–5.3)
Sodium: 142 mEq/L (ref 137–147)
Total Protein: 4.4 g/dL — ABNORMAL LOW (ref 6.0–8.3)

## 2013-10-04 MED ORDER — HYDROCODONE-ACETAMINOPHEN 5-325 MG PO TABS: 1.0000 | ORAL_TABLET | Freq: Four times a day (QID) | ORAL | Status: DC | PRN

## 2013-10-04 MED ORDER — PROMETHAZINE HCL 12.5 MG PO TABS: 12.5000 mg | ORAL_TABLET | Freq: Four times a day (QID) | ORAL | Status: DC | PRN

## 2013-10-04 MED ORDER — SENNA 8.6 MG PO TABS: 1.0000 | ORAL_TABLET | Freq: Two times a day (BID) | ORAL | Status: DC

## 2013-10-04 MED ORDER — METOPROLOL TARTRATE 12.5 MG HALF TABLET
12.5000 mg | ORAL_TABLET | Freq: Two times a day (BID) | ORAL | Status: DC
  Filled 2013-10-04: qty 1

## 2013-10-04 MED ORDER — CLOPIDOGREL BISULFATE 75 MG PO TABS: 75.0000 mg | ORAL_TABLET | Freq: Every day | ORAL | Status: DC

## 2013-10-04 MED ORDER — METOPROLOL TARTRATE 12.5 MG HALF TABLET: 12.5000 mg | ORAL_TABLET | Freq: Two times a day (BID) | ORAL | Status: DC

## 2013-10-04 MED ORDER — SORBITOL 70 % SOLN: 30.0000 mL | Freq: Every day | Status: DC | PRN

## 2013-10-04 NOTE — Discharge Summary (Signed)
Physician Discharge Summary  Alexis Higgins [id number] DOB: 07/20/1940 DOA: 09/30/2013  PCP: Alexis Cower, MD  Admit date: 09/30/2013 Discharge date: 10/04/2013  Time spent: 40 minutes  Recommendations for Outpatient Follow-up:  1. Needs CBC 1 week + [facility]  2. 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  History of present illness:  73 y/o ?, known h/o severe COPD, CAD on Plavix, hypertension-admitted with mechanical fall after turning quickly losing balance. This is in the fourth fall probably in the last couple of months.  She was actually ambulating with her oxygen and may have fallen on O2 tank.  Underwent left hip unipolar arthroplasty [id number]  Complications of nausea vomiting and fever 101.9 on 5/12  Noted anemia 6.2 hemoglobin and transfuse 2 units packed red blood cells 5/12   Hospital Course:  :  1. Displaced fracture of left fem neck status post left unipolar arthroplasty 5/11. Full weightbearing, posterior hip precautions, Cont ASA 81 and Plavix 75 daily. Pain control ordered =  Percocet 1-2 every 6 when necessary 2. Postoperative fever-blood culture urine culture cxr neg . Resolved. 3. Acute blood loss anemia + dilutional component-transfused 2 units packed blood cells with appropriate response from 6.2 to 10.5. stable 4. Hypotension in setting of IV morphine, acute blood loss anemia-Monitor. Will d/c Imdur 30. Cont Metoprolol 12.5 bid as OP 5. Gold COPD stage III, O2 dependent he stable at present time-continue Spiriva 1 capsule daily, continue Symbicort twice a day 6. History of  transient ischemic attack-stable 7. History of head contusion with this fall-patient was on Plavix however she has not lost consciousness or or any other issues currently. Monitor 8. Depression-continue Pamelor 10 mg each bedtime 9. Constipation. Continue Senna and Sorbitol PO    Consultants:  orthopedics Procedures:  Unipolar arthroplasty 5/11 Antibiotics:  Intraoperative Ancef  Discharge Exam: Filed Vitals:   10/04/13 0807  BP: 103/63  Pulse: 91  Temp:   Resp:     General: Well alert oriented in NAd Cardiovascular: s1 s 2 no m/r/g Respiratory: clear  Discharge Instructions You were cared for by a hospitalist during your hospital stay. If you have any questions about your discharge medications or the care you received while you were in the hospital after you are discharged, you can call the unit and asked to speak with the hospitalist on call if the hospitalist that took care of you is not available. Once you are discharged, your primary care physician will handle any further medical issues. Please note that NO REFILLS for any discharge medications will be authorized once you are discharged, as it is imperative that you return to your primary care physician (or establish a relationship with a primary care physician if you do not have one) for your aftercare needs so that they can reassess your need for medications and monitor your lab values.  Discharge Orders   Future Orders Complete By Expires   Weight bearing as tolerated  As directed    Questions:     Laterality:     Extremity:  Medication List    STOP taking these medications       budesonide-formoterol 160-4.5 MCG/ACT inhaler  Commonly known as:  SYMBICORT     isosorbide mononitrate 30 MG 24 hr tablet  Commonly known as:  IMDUR     MIRALAX powder  Generic drug:  polyethylene glycol powder      TAKE these medications       aspirin EC 81 MG tablet  Take 81 mg by mouth daily.     clopidogrel 75 MG  tablet  Commonly known as:  PLAVIX  Take 1 tablet (75 mg total) by mouth daily.     clopidogrel 75 MG tablet  Commonly known as:  PLAVIX  Take 1 tablet (75 mg total) by mouth daily with breakfast.     docusate sodium 100 MG capsule  Commonly known as:  COLACE  Take 100 mg by mouth daily as needed. For constipation     HYDROcodone-acetaminophen 5-325 MG per tablet  Commonly known as:  NORCO/VICODIN  Take 1-2 tablets by mouth every 6 (six) hours as needed for moderate pain.     lovastatin 20 MG tablet  Commonly known as:  MEVACOR  Take 1 tablet (20 mg total) by mouth at bedtime.     metoprolol tartrate 12.5 mg Tabs tablet  Commonly known as:  LOPRESSOR  Take 0.5 tablets (12.5 mg total) by mouth 2 (two) times daily.     nitroGLYCERIN 0.4 MG SL tablet  Commonly known as:  NITROSTAT  Place 1 tablet (0.4 mg total) under the tongue every 5 (five) minutes as needed for chest pain (up to 3 doses).     nortriptyline 10 MG capsule  Commonly known as:  PAMELOR  Take 10 mg by mouth.     ondansetron 4 MG tablet  Commonly known as:  ZOFRAN  Take 4 mg by mouth 3 (three) times daily as needed. For nausea (take with tramadol)     ondansetron 4 MG tablet  Commonly known as:  ZOFRAN  Take 1 tablet (4 mg total) by mouth every 8 (eight) hours as needed for nausea or vomiting.     promethazine 12.5 MG tablet  Commonly known as:  PHENERGAN  Take 1 tablet (12.5 mg total) by mouth every 6 (six) hours as needed for nausea or vomiting.     senna 8.6 MG Tabs tablet  Commonly known as:  SENOKOT  Take 1 tablet (8.6 mg total) by mouth 2 (two) times daily.     sorbitol 70 % Soln  Take 30 mLs by mouth daily as needed for moderate constipation.     SPIRIVA HANDIHALER 18 MCG inhalation capsule  Generic drug:  tiotropium  INHALE 1 CAPSULE VIA HANDIHALER ONCE DAILY AT THE SAME TIME EVERY DAY     tiotropium 18 MCG inhalation capsule  Commonly known as:  SPIRIVA  Place 18 mcg into inhaler and inhale  daily.     traMADol 50 MG tablet  Commonly known as:  ULTRAM  Take 100 mg by mouth 3 (three) times daily as needed. For pain     Vitamin D 1000 UNITS capsule  Take 1,000 Units by mouth every evening.       Allergies  Allergen Reactions  . Aspirin Other (See Comments)     cns bleed risk  . Codeine Rash       Follow-up Information   Follow up with Marianna Payment, MD In 2 weeks.   Specialty:  Orthopedic Surgery   Contact  information:   [address] [place] [id number] [phone]       Follow up with Alexis Cower, MD.   Specialties:  Internal Medicine, Radiology   Contact information:   [place] [address] [phone]        The results of significant diagnostics from this hospitalization (including imaging, microbiology, ancillary and laboratory) are listed below for reference.    Significant Diagnostic Studies: Dg Chest 2 View  10/02/2013   CLINICAL DATA:  Fever.  EXAM: CHEST  2 VIEW  COMPARISON:  CT CHEST W/CM dated 06/01/2012; DG CHEST 2 VIEW dated 04/12/2012  FINDINGS: Normal heart size and pulmonary vascularity. Emphysematous changes and fibrosis in the lungs. No focal airspace disease or consolidation. No blunting of costophrenic angles. Bilateral breast implants.  IMPRESSION: Emphysematous changes and fibrosis in the lungs. No evidence of active pulmonary disease.   Electronically Signed   By: Lucienne Capers M.D.   On: 10/02/2013 22:26   Dg Hip Complete Left  09/30/2013   CLINICAL DATA:  [place] and injured left hip.  EXAM: LEFT HIP - COMPLETE 2+ VIEW  COMPARISON:  None.  FINDINGS: Comminuted fracture involving the basicervical region of the left femoral neck. Left hip joint anatomically aligned with mild to moderate medial joint space narrowing. No other fractures.  Included AP pelvis demonstrates severe medial joint space narrowing involving the contralateral right hip with hypertrophic spurring involving the femoral head.  Sacroiliac joints and symphysis pubis intact. Degenerative changes involving the visualized lower lumbar spine.  IMPRESSION: 1. Acute basicervical left femoral neck fracture. 2. 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 3. 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   Electronically Signed   By: Evangeline Dakin M.D.   On: 09/30/2013 16:41   Ct Head Wo Contrast  09/30/2013   CLINICAL DATA:  Fall, patient was walking down the steps in tripped over oxygen tank in hit head on wall and now has hematoma left side of forehead  EXAM: CT HEAD WITHOUT CONTRAST  TECHNIQUE: Contiguous axial images were obtained from the base of the skull through the vertex without intravenous contrast.  COMPARISON:  MR HEAD W/O CM dated 06/12/2005; CT HEAD W/O CM dated 06/12/2005  FINDINGS: There is moderate diffuse atrophy. There is mild to moderate low attenuation in the deep white matter. There is no abnormal attenuation to suggest acute hemorrhage or extra-axial fluid. There is no evidence of vascular territory infarct. There is no evidence of mass or hydrocephalus. There is no skull fracture. There is a small hematoma in the scalp over the left frontal region. There is a stable 7 mm focus of hyperattenuation in the right cerebral peduncle. 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 No acute intracranial abnormalities.   Electronically Signed   By: Skipper Cliche M.D.   On: 09/30/2013 16:03   Dg Pelvis Portable  10/01/2013   CLINICAL DATA:  Postop total left hip  EXAM: PORTABLE PELVIS 1-2 VIEWS  COMPARISON:  DG FEMUR*L*PORT dated 09/30/2013  FINDINGS: Interval postoperative changes with left hip hemiarthroplasty using non cemented components. Components appear well seated and there is no subluxation. No acute fractures are  demonstrated. Skin clips and subcutaneous emphysema consistent with recent surgery. Degenerative changes in the right hip.  IMPRESSION: Left hip hemiarthroplasty.  Components appear well seated.  Electronically Signed   By: Lucienne Capers M.D.   On: 10/01/2013 21:39   Dg Femur Left Port  09/30/2013   CLINICAL DATA:  Preop left hip pain.  EXAM: PORTABLE LEFT FEMUR - 2 VIEW  COMPARISON:  Pelvis/ left hip films earlier today.  FINDINGS: Exam again demonstrates patient's minimally displaced subcapital left femoral neck fracture. Remainder the exam is unremarkable.  IMPRESSION: Evidence of patient's minimally displaced subcapital left femoral neck fracture.   Electronically Signed   By: Marin Olp M.D.   On: 09/30/2013 18:43   Dg Knee Complete 4 Views Left  09/30/2013   CLINICAL DATA:  Fall earlier today. Abrasion overlying the patella. Bruising involving the anterior portion of the knee.  EXAM: LEFT KNEE - COMPLETE 4+ VIEW  COMPARISON:  None.  FINDINGS: No evidence of acute fracture or dislocation. Joint spaces well preserved for age. Mild osseous demineralization. Bone island in the proximal tibia. No visible joint effusion.  IMPRESSION: No acute osseous abnormality.  Mild osseous demineralization.   Electronically Signed   By: Evangeline Dakin M.D.   On: 09/30/2013 16:39    Microbiology: Recent Results (from the past 240 hour(s))  SURGICAL PCR SCREEN     Status: None   Collection Time    10/01/13  1:07 AM      Result Value Ref Range Status   MRSA, PCR NEGATIVE  NEGATIVE Final   Staphylococcus aureus NEGATIVE  NEGATIVE Final   Comment:            The Xpert SA Assay (FDA     approved for NASAL specimens     in patients over 13 years of age),     is one component of     a comprehensive surveillance     program.  Test performance has     been validated by [employer] for patients greater     than or equal to 69 year old.     It is not intended     to diagnose infection nor to      guide or monitor treatment.  URINE CULTURE     Status: None   Collection Time    10/02/13  8:04 PM      Result Value Ref Range Status   Specimen Description URINE, CATHETERIZED   Final   Special Requests NONE   Final   Culture  Setup Time     Final   Value: 10/03/2013 00:49     Performed at [place]     Final   Value: NO GROWTH     Performed at [facility]   Culture     Final   Value: NO GROWTH     Performed at [facility]   Report Status 10/03/2013 FINAL   Final  CULTURE, BLOOD (ROUTINE X 2)     Status: None   Collection Time    10/02/13  8:35 PM      Result Value Ref Range Status   Specimen Description BLOOD RIGHT HAND   Final   Special Requests BOTTLES DRAWN AEROBIC ONLY 8CC   Final   Culture  Setup Time     Final   Value: 10/03/2013 00:48     Performed at [facility]   Culture     Final   Value:        BLOOD CULTURE RECEIVED NO GROWTH TO DATE CULTURE WILL BE HELD FOR 5  DAYS BEFORE ISSUING A FINAL NEGATIVE REPORT     Performed at [facility]   Report Status PENDING   Incomplete  CULTURE, BLOOD (ROUTINE X 2)     Status: None   Collection Time    10/02/13  8:50 PM      Result Value Ref Range Status   Specimen Description BLOOD RIGHT HAND   Final   Special Requests BOTTLES DRAWN AEROBIC ONLY [facility]   Final   Culture  Setup Time     Final   Value: 10/03/2013 00:48     Performed at [facility]   Culture     Final   Value:        BLOOD CULTURE RECEIVED NO GROWTH TO DATE CULTURE WILL BE HELD FOR 5 DAYS BEFORE ISSUING A FINAL NEGATIVE REPORT     Performed at [facility]   Report Status PENDING   Incomplete     Labs: Basic Metabolic Panel:  Recent Labs Lab 09/30/13 1714 10/01/13 0528 10/02/13 0500 10/04/13 0553  NA 139 140 139 142  K 3.6* 3.9 4.5 4.3  CL 99 102 107 108  CO2 32 28 21 27   GLUCOSE 100* 97 149* 94  BUN 7 7 11 13   CREATININE 0.80 0.85 0.75 0.66  CALCIUM 9.3 8.3* 7.0* 7.9*    Liver Function Tests:  Recent Labs Lab 10/04/13 0553  AST 18  ALT <5  ALKPHOS 66  BILITOT 0.4  PROT 4.4*  ALBUMIN 1.8*   No results found for this basename: LIPASE, AMYLASE,  in the last 168 hours No results found for this basename: AMMONIA,  in the last 168 hours CBC:  Recent Labs Lab 10/01/13 0528 10/02/13 0500 10/02/13 2051 10/03/13 0840 10/04/13 0553  WBC 6.9 11.8* 11.8* 12.6* 10.7*  NEUTROABS  --   --  8.4* 9.7*  --   HGB 10.5* 8.2* 6.2* 10.5* 9.1*  HCT 32.5* 24.3* 18.4* 30.1* 26.7*  MCV 84.2 82.4 82.1 83.4 83.7  PLT 205 216 177 182 189   Cardiac Enzymes: No results found for this basename: CKTOTAL, CKMB, CKMBINDEX, TROPONINI,  in the last 168 hours BNP: BNP (last 3 results) No results found for this basename: PROBNP,  in the last 8760 hours CBG: No results found for this basename: GLUCAP,  in the last 168 hours     Signed:  Nita Sells  [facility] 10/04/2013, 2:37 PM

## 2013-10-04 NOTE — Progress Notes (Signed)
Pt states that "chest discomfort" is "better to gone". Will continue to monitor.

## 2013-10-04 NOTE — Progress Notes (Signed)
Pt c/o "burning" in chest after breakfast. Pt states, "when I don't sit up straight to eat-I get this". Pt states, "I have had this problem at home and did try some Prilosec for it". BP 103/67 pulse 91. Pt sats 94% 2 lpm per home dose/usage. Pt repts no change in her baseline resp status. Pt denies any other s/sx distress. Pt received Zofran for her h/o nausea and vomiting with meds last at 0615 AM. Pt given mylanta per order. Will continue to monitor.

## 2013-10-04 NOTE — Progress Notes (Signed)
Clinical Social Work Department BRIEF PSYCHOSOCIAL ASSESSMENT 10/04/2013  Patient:  Alexis Higgins, Alexis Higgins     Account Number:  [id number]     Admit date:  09/30/2013  Clinical Social Worker:  Adair Laundry  Date/Time:  10/04/2013 02:00 PM  Referred by:  Physician  Date Referred:  10/04/2013 Referred for  SNF Placement   Other Referral:   Interview type:  Patient Other interview type:   Spoke with pt and pt husband at bedside    PSYCHOSOCIAL DATA Living Status:  HUSBAND Admitted from facility:   Level of care:   Primary support name:  Alexis Higgins [phone] Primary support relationship to patient:  SPOUSE Degree of support available:   Pt has good support    CURRENT CONCERNS Current Concerns  Post-Acute Placement   Other Concerns:    SOCIAL WORK ASSESSMENT / PLAN CSW informed that pt insurance denied coverage for CIR. CSW visited pt room and spoke with pt and pt husband. They were already aware of CIR denial and hoping to be able to dc to [place]. CSW explained referral process and they would like to hear from [facility] first, but if they cannot offer pt and pthusband are agreeable to full [facility] search.    CSW spoke with [facility] and they are able to offer a bed for pt today. CSW notified MD who confirmed pt can dc today. Pt and pt husband made aware and they are agreeable. After discussing, pt and pt husband have decided non-emergent ambulance will be safest for transport.   Assessment/plan status:  Psychosocial Support/Ongoing Assessment of Needs Other assessment/ plan:   Information/referral to community resources:   None needed    PATIENT'S/FAMILY'S RESPONSE TO PLAN OF CARE: Pt is agreeable to [place] rehab       Alexis Higgins, [place]

## 2013-10-04 NOTE — Progress Notes (Signed)
Clinical Social Work Department CLINICAL SOCIAL WORK PLACEMENT NOTE 10/04/2013  Patient:  Alexis Higgins, Alexis Higgins  Account Number:  [id number] Admit date:  09/30/2013  Clinical Social Worker:  Berton Mount, Latanya Presser  Date/time:  10/04/2013 02:55 PM  Clinical Social Work is seeking post-discharge placement for this patient at the following level of care:   SKILLED NURSING   (*CSW will update this form in Epic as items are completed)   10/04/2013  Patient/family provided with [facility] Department of Clinical Social Work's list of facilities offering this level of care within the geographic area requested by the patient (or if unable, by the patient's family).  10/04/2013  Patient/family informed of their freedom to choose among providers that offer the needed level of care, that participate in Medicare, Medicaid or managed care program needed by the patient, have an available bed and are willing to accept the patient.  10/04/2013  Patient/family informed of [facility]' ownership interest in [facility], as well as of the fact that they are under no obligation to receive care at this facility.  PASARR submitted to EDS on 10/04/2013 PASARR number received from EDS on 10/04/2013  FL2 transmitted to all facilities in geographic area requested by pt/family on  10/04/2013 FL2 transmitted to all facilities within larger geographic area on   Patient informed that his/her managed care company has contracts with or will negotiate with  certain facilities, including the following:     Patient/family informed of bed offers received:  10/04/2013 Patient chooses bed at [facility], [place] Physician recommends and patient chooses bed at    Patient to be transferred to [facility] on  10/04/2013 Patient to be transferred to facility by [facility]  The following physician request were entered in Epic:   Additional Comments:   [place],  [place]

## 2013-10-04 NOTE — Progress Notes (Signed)
CSW (Clinical Social Worker) prepared pt dc packet and placed with shadow chart. CSW arranged non-emergent ambulance transport. Pt, pt family, pt nurse, and facility informed. CSW signing off.  Athina Fahey, LCSWA [phone]  

## 2013-10-04 NOTE — Progress Notes (Signed)
Rept to Dr. Verlon Au. Will hold Lopressor and Imdur for low BP until MD makes round. Order obtained to d/c foley.

## 2013-10-04 NOTE — Progress Notes (Signed)
Subjective:  Patient reports pain as improved.  Pain is better controlled  Objective:   VITALS:   Filed Vitals:   10/04/13 0800 10/04/13 0807 10/04/13 0945 10/04/13 1200  BP:  103/63    Pulse:  91    Temp:      TempSrc:      Resp: 18   18  SpO2:   92%     Scant bloody drainage on dressing.   Lab Results  Component Value Date   WBC 10.7* 10/04/2013   HGB 9.1* 10/04/2013   HCT 26.7* 10/04/2013   MCV 83.7 10/04/2013   PLT 189 10/04/2013     Assessment/Plan:  3 Days Post-Op   - Expected postop acute blood loss anemia - will monitor for symptoms, Hgb 9.1 - Up with PT/OT - recommend SNF - DVT ppx - SCDs, ambulation, asa, plavix - lovenox stopped due to oozing and persistent drop in Hgb - WBAT left lower extremity, posterior hip precautions - Pain control - dressing changed - SNF pending   Problem List Items Addressed This Visit     Cardiovascular and Mediastinum   HYPERTENSION   Relevant Medications      nitroGLYCERIN (NITROSTAT) SL tablet 0.4 mg      aspirin EC tablet 81 mg      metoprolol tartrate (LOPRESSOR) tablet 12.5 mg (Start on 10/04/2013 10:00 PM)      metoprolol tartrate (LOPRESSOR) tablet     Respiratory   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

## 2013-10-04 NOTE — Progress Notes (Signed)
Rehab admissions - I spoke with [employer] on site case Freight forwarder.  We cannot get approval for acute inpatient rehab admission given current diagnosis.  I met with patient and her family and with Dr. Verlon Au.  I have explained denial from insurance to them.  Options will be SNF or HH therapies if patient progresses.  Patient is interested in [facility] in [place] since she cannot admit to inpatient rehab.  Call me for questions.  #[phone]

## 2013-10-04 NOTE — Progress Notes (Signed)
Physical Therapy Treatment Patient Details Name: Alexis Higgins MRN: [id number] DOB: 1941/03/23 Today's Date: 10/04/2013    History of Present Illness 73 y.o. female admitted after fall with displaced L femoral neck fx, s/p L posterior THA on 10/01/13, pt also has COPD and CAD    PT Comments    Patient very eager and motivated to get out of bed and work with therapy this session. Patient able to walk a couple of steps with Min A. Continue to recommend comprehensive inpatient rehab (CIR) for post-acute therapy needs.   Follow Up Recommendations  CIR     Equipment Recommendations  Rolling walker with 5" wheels;3in1 (PT)    Recommendations for Other Services       Precautions / Restrictions Precautions Precautions: Posterior Hip;Fall Precaution Comments: Reviewed all posterior hip precautions with pt and pt's husband Restrictions Weight Bearing Restrictions: Yes LLE Weight Bearing: Weight bearing as tolerated    Mobility  Bed Mobility Overal bed mobility: Needs Assistance             General bed mobility comments: Patient able to assist herself better to EOB. CUes for positioning and hand placement   Transfers Overall transfer level: Needs assistance Equipment used: Rolling walker (2 wheeled)   Sit to Stand: Mod assist         General transfer comment: Patient able to stand with Mod A this session. Cues for  LLE posiitning and not to lean over 90 degrees.  Cues for safe hand placement. A to power up into standing position and find balance  Ambulation/Gait Ambulation/Gait assistance: Min assist Ambulation Distance (Feet): 6 Feet Assistive device: Rolling walker (2 wheeled) Gait Pattern/deviations: Step-to pattern;Trunk flexed     General Gait Details: Min A for RW positioning and to advance LLE with gait. Patient fatigues quickly   Stairs            Wheelchair Mobility    Modified Rankin (Stroke Patients Only)       Balance                                    Cognition Arousal/Alertness: Awake/alert Behavior During Therapy: WFL for tasks assessed/performed Overall Cognitive Status: Within Functional Limits for tasks assessed                      Exercises Total Joint Exercises Quad Sets: AROM;Left;10 reps Gluteal Sets: AROM;Both;10 reps Heel Slides: AAROM;Left;10 reps Hip ABduction/ADduction: AAROM;Left;10 reps    General Comments        Pertinent Vitals/Pain 6/10 L hip pain. RN provided medication to assist with pain control,    Home Living                      Prior Function            PT Goals (current goals can now be found in the care plan section) Progress towards PT goals: Progressing toward goals    Frequency  Min 3X/week    PT Plan Current plan remains appropriate    Co-evaluation             End of Session Equipment Utilized During Treatment: Gait belt;Oxygen Activity Tolerance: Patient limited by pain;Patient limited by fatigue Patient left: in chair;with call bell/phone within reach     Time: 0931-0958 PT Time Calculation (min): 27 min  Charges:  $Gait Training: 8-22 mins $Therapeutic  Exercise: 8-22 mins                    G Codes:      Tonia Brooms Robinette 10/04/2013, 10:55 AM 10/04/2013 [place] PTA [phone] pager [phone] office

## 2013-10-04 NOTE — Progress Notes (Signed)
Pt up with PT. Dressing to L hip changed per Dr. Delton Coombes order. Incision well approximated. No s/sx infection noted. Some dried blood noted at the incision approximation site. Incision cleaned with betadine and new mepilex applied. Pt noted to have blanchable reddness of bil heels. Pt instructed the importance of floating heels to prevent further skin breakdown. Pt noted to have a small circular red blanchable red area of R buttock inner. Area isn't open. Barrier cream applied. Pt instructed importance of turning every 2 hours to prevent further skin breakdown. Pt repts LBM 5/9. Pt given sorbitol per order.  Will continue to monitor.

## 2013-10-04 NOTE — Progress Notes (Signed)
Note: This document was prepared with digital dictation and possible smart phrase technology. Any transcriptional errors that result from this process are unintentional.   Alexis Higgins NOT:[id number] DOB: December 22, 1940 DOA: 09/30/2013 PCP: Cathlean Cower, MD  Brief narrative: 73 y/o ?, known h/o severe COPD, CAD on Plavix, hypertension-admitted with mechanical fall after turning quickly losing balance. This is in the fourth fall probably in the last couple of months. She was actually ambulating with her oxygen and may have fallen on O2 tank.  Underwent left hip unipolar arthroplasty 3/83 Complications of nausea vomiting and fever 101.9 on 5/12 Noted anemia 6.2 hemoglobin and transfuse 2 units packed red blood cells 5/12  Past medical history-As per Problem list Chart reviewed as below- review  Consultants:  orthopedics  Procedures:  Unipolar arthroplasty 5/11  Antibiotics:  Intraoperative Ancef   Subjective   Well  Eating lunch DId much more with therapy No n/v right now Pressures a little soft   Objective    Interim History:   Telemetry: none   Objective: Filed Vitals:   10/04/13 0517 10/04/13 0800 10/04/13 0807 10/04/13 0945  BP: 108/54  103/63   Pulse: 80  91   Temp: 97.9 F (36.6 C)     TempSrc:      Resp: 16 18    SpO2: 100%   92%    Intake/Output Summary (Last 24 hours) at 10/04/13 1405 Last data filed at 10/04/13 1004  Gross per 24 hour  Intake    240 ml  Output    725 ml  Net   -485 ml    Exam:  General: fair EOMI NCAT Cardiovascular: S1-S2 no murmur rub or gallop-slightly tachycardic Respiratory: clinically clear no added sounds,? Implants Abdomen: soft nontender nondistended area Skinextremity rotated left hip Neur ointact  Data Reviewed: Basic Metabolic Panel:  Recent Labs Lab 09/30/13 1714 10/01/13 0528 10/02/13 0500 10/04/13 0553  NA 139 140 139 142  K 3.6* 3.9 4.5 4.3  CL 99 102 107 108  CO2 32 28 21 27   GLUCOSE 100*  97 149* 94  BUN 7 7 11 13   CREATININE 0.80 0.85 0.75 0.66  CALCIUM 9.3 8.3* 7.0* 7.9*   Liver Function Tests:  Recent Labs Lab 10/04/13 0553  AST 18  ALT <5  ALKPHOS 66  BILITOT 0.4  PROT 4.4*  ALBUMIN 1.8*   No results found for this basename: LIPASE, AMYLASE,  in the last 168 hours No results found for this basename: AMMONIA,  in the last 168 hours CBC:  Recent Labs Lab 10/01/13 0528 10/02/13 0500 10/02/13 2051 10/03/13 0840 10/04/13 0553  WBC 6.9 11.8* 11.8* 12.6* 10.7*  NEUTROABS  --   --  8.4* 9.7*  --   HGB 10.5* 8.2* 6.2* 10.5* 9.1*  HCT 32.5* 24.3* 18.4* 30.1* 26.7*  MCV 84.2 82.4 82.1 83.4 83.7  PLT 205 216 177 182 189   Cardiac Enzymes: No results found for this basename: CKTOTAL, CKMB, CKMBINDEX, TROPONINI,  in the last 168 hours BNP: No components found with this basename: POCBNP,  CBG: No results found for this basename: GLUCAP,  in the last 168 hours  Recent Results (from the past 240 hour(s))  SURGICAL PCR SCREEN     Status: None   Collection Time    10/01/13  1:07 AM      Result Value Ref Range Status   MRSA, PCR NEGATIVE  NEGATIVE Final   Staphylococcus aureus NEGATIVE  NEGATIVE Final   Comment:  The Xpert SA Assay (FDA     approved for NASAL specimens     in patients over 32 years of age),     is one component of     a comprehensive surveillance     program.  Test performance has     been validated by [employer] for patients greater     than or equal to 50 year old.     It is not intended     to diagnose infection nor to     guide or monitor treatment.  URINE CULTURE     Status: None   Collection Time    10/02/13  8:04 PM      Result Value Ref Range Status   Specimen Description URINE, CATHETERIZED   Final   Special Requests NONE   Final   Culture  Setup Time     Final   Value: 10/03/2013 00:49     Performed at [facility] Count     Final   Value: NO GROWTH     Performed at [facility]    Culture     Final   Value: NO GROWTH     Performed at [facility]   Report Status 10/03/2013 FINAL   Final  CULTURE, BLOOD (ROUTINE X 2)     Status: None   Collection Time    10/02/13  8:35 PM      Result Value Ref Range Status   Specimen Description BLOOD RIGHT HAND   Final   Special Requests BOTTLES DRAWN AEROBIC ONLY 8CC   Final   Culture  Setup Time     Final   Value: 10/03/2013 00:48     Performed at [facility]   Culture     Final   Value:        BLOOD CULTURE RECEIVED NO GROWTH TO DATE CULTURE WILL BE HELD FOR 5 DAYS BEFORE ISSUING A FINAL NEGATIVE REPORT     Performed at [facility]   Report Status PENDING   Incomplete  CULTURE, BLOOD (ROUTINE X 2)     Status: None   Collection Time    10/02/13  8:50 PM      Result Value Ref Range Status   Specimen Description BLOOD RIGHT HAND   Final   Special Requests BOTTLES DRAWN AEROBIC ONLY [facility]   Final   Culture  Setup Time     Final   Value: 10/03/2013 00:48     Performed at [facility]   Culture     Final   Value:        BLOOD CULTURE RECEIVED NO GROWTH TO DATE CULTURE WILL BE HELD FOR 5 DAYS BEFORE ISSUING A FINAL NEGATIVE REPORT     Performed at [facility]   Report Status PENDING   Incomplete     Studies:              All Imaging reviewed and is as per above notation   Scheduled Meds: . aspirin EC  81 mg Oral Daily  . clopidogrel  75 mg Oral Q breakfast  . isosorbide mononitrate  30 mg Oral Daily  . metoprolol tartrate  25 mg Oral BID  . nortriptyline  10 mg Oral QHS  . senna  1 tablet Oral BID  . tiotropium  18 mcg Inhalation Daily   Continuous Infusions: . sodium chloride Stopped (10/03/13 0636)  . lactated  ringers Stopped (10/02/13 0747)     Assessment/Plan: 1. Displaced fracture of left fem neck status post left unipolar arthroplasty 5/11. Full weightbearing, posterior hip precautions, Cont ASA 81 and Plavix 75 daily.  Pain control ordered = morphine 0.5 mg every  2 prn, Percocet 1-2 every 6 when necessary-minimize Robaxin 500 every 6 when necessary as nausea.  IS encouraged 2. Postoperative fever-blood culture urine culture neg.  Resolved. 3.  Acute blood loss anemia + dilutional component-transfused 2 units packed blood cells with appropriate response from 6.2 to 10.5.  stable 4. Hypotension in setting of IV morphine, acute blood loss anemia-Monitor.  Will d/c Imdur 30.  Cont Metoprolol 12.5 bid 5. Gold COPD stage III, O2 dependent he stable at present time-continue Spiriva 1 capsule daily, continue Symbicort twice a day 6. History of transient ischemic attack-stable 7. History of head contusion with this fall-patient was on Plavix however she has not lost consciousness or or any other issues currently. Monitor 8. Depression-continue Pamelor 10 mg each bedtime 9. Constipation. Hold off on MiraLax for right now but may need to be reimplanted this admit  Code Status: FULL Family Communication: discussed with family at bedside Disposition Plan: SNF   Verneita Griffes, MD  [facility] Pager [phone] 10/04/2013, 2:05 PM    LOS: 4 days

## 2013-10-05 ENCOUNTER — Encounter (HOSPITAL_COMMUNITY): Payer: Self-pay | Admitting: Orthopaedic Surgery

## 2013-10-05 NOTE — Care Management Note (Signed)
CARE MANAGEMENT NOTE 10/05/2013  Patient:  Alexis Higgins, Alexis Higgins   Account Number:  [id number]  Date Initiated:  10/04/2013  Documentation initiated by:  Ricki Miller  Subjective/Objective Assessment:   73 yr old female s/p left femerol neck fracture     Action/Plan:   Patient will be going to [facility]. Social worker is handling.   Anticipated DC Date:  10/04/2013   Anticipated DC Plan:  SKILLED NURSING FACILITY  In-house referral  Clinical Social Worker      DC Planning Services  CM consult      Choice offered to / List presented to:             Status of service:  Completed, signed off Medicare Important Message given?  YES (If response is "NO", the following Medicare IM given date fields will be blank) Date Medicare IM given:   Date Additional Medicare IM given:    Discharge Disposition:  [place]

## 2013-10-09 LAB — CULTURE, BLOOD (ROUTINE X 2)
CULTURE: NO GROWTH
Culture: NO GROWTH

## 2013-10-23 ENCOUNTER — Ambulatory Visit (INDEPENDENT_AMBULATORY_CARE_PROVIDER_SITE_OTHER): Payer: Medicare Other | Admitting: General Practice

## 2013-10-23 LAB — POCT INR: INR: 1.18

## 2013-10-24 ENCOUNTER — Other Ambulatory Visit (INDEPENDENT_AMBULATORY_CARE_PROVIDER_SITE_OTHER): Payer: Medicare Other

## 2013-10-24 ENCOUNTER — Ambulatory Visit (INDEPENDENT_AMBULATORY_CARE_PROVIDER_SITE_OTHER): Payer: Medicare Other | Admitting: Internal Medicine

## 2013-10-24 ENCOUNTER — Telehealth: Payer: Self-pay | Admitting: Internal Medicine

## 2013-10-24 ENCOUNTER — Encounter: Payer: Self-pay | Admitting: Internal Medicine

## 2013-10-24 VITALS — BP 120/62 | HR 73 | Temp 98.1°F | Wt 122.0 lb

## 2013-10-24 DIAGNOSIS — D62 Acute posthemorrhagic anemia: Secondary | ICD-10-CM

## 2013-10-24 DIAGNOSIS — F3289 Other specified depressive episodes: Secondary | ICD-10-CM

## 2013-10-24 DIAGNOSIS — Z23 Encounter for immunization: Secondary | ICD-10-CM

## 2013-10-24 DIAGNOSIS — I10 Essential (primary) hypertension: Secondary | ICD-10-CM

## 2013-10-24 DIAGNOSIS — F329 Major depressive disorder, single episode, unspecified: Secondary | ICD-10-CM

## 2013-10-24 LAB — BASIC METABOLIC PANEL
BUN: 9 mg/dL (ref 6–23)
CO2: 31 meq/L (ref 19–32)
Calcium: 8.7 mg/dL (ref 8.4–10.5)
Chloride: 100 mEq/L (ref 96–112)
Creatinine, Ser: 0.7 mg/dL (ref 0.4–1.2)
GFR: 93.5 mL/min (ref >60.00)
GLUCOSE: 93 mg/dL (ref 70–99)
Potassium: 4 mEq/L (ref 3.5–5.1)
SODIUM: 137 meq/L (ref 135–145)

## 2013-10-24 LAB — CBC WITH DIFFERENTIAL/PLATELET
BASOS ABS: 0 10*3/uL (ref 0.0–0.1)
Basophils Relative: 0.3 % (ref 0.0–3.0)
Eosinophils Absolute: 0.3 10*3/uL (ref 0.0–0.7)
Eosinophils Relative: 4.9 % (ref 0.0–5.0)
HCT: 36.4 % (ref 36.0–46.0)
HEMOGLOBIN: 11.9 g/dL — AB (ref 12.0–15.0)
LYMPHS PCT: 20.4 % (ref 12.0–46.0)
Lymphs Abs: 1.4 10*3/uL (ref 0.7–4.0)
MCHC: 32.8 g/dL (ref 30.0–36.0)
MCV: 87.2 fl (ref 78.0–100.0)
MONOS PCT: 7.9 % (ref 3.0–12.0)
Monocytes Absolute: 0.5 10*3/uL (ref 0.1–1.0)
NEUTROS ABS: 4.5 10*3/uL (ref 1.4–7.7)
NEUTROS PCT: 66.5 % (ref 43.0–77.0)
Platelets: 486 10*3/uL — ABNORMAL HIGH (ref 150.0–400.0)
RBC: 4.18 Mil/uL (ref 3.87–5.11)
RDW: 15.7 % — AB (ref 11.5–15.5)
WBC: 6.7 10*3/uL (ref 4.0–10.5)

## 2013-10-24 LAB — HEPATIC FUNCTION PANEL
ALBUMIN: 3 g/dL — AB (ref 3.5–5.2)
ALK PHOS: 92 U/L (ref 39–117)
ALT: 11 U/L (ref 0–35)
AST: 19 U/L (ref 0–37)
Bilirubin, Direct: 0.1 mg/dL (ref 0.0–0.3)
TOTAL PROTEIN: 6 g/dL (ref 6.0–8.3)
Total Bilirubin: 0.5 mg/dL (ref 0.2–1.2)

## 2013-10-24 NOTE — Telephone Encounter (Signed)
Relevant patient education assigned to patient using Emmi. ° °

## 2013-10-24 NOTE — Progress Notes (Signed)
Subjective:    Patient ID: Alexis Higgins, female    DOB: 07/31/1940, 73 y.o.   MRN: [id number]  HPI  Here to f/u recent hip fx s/p ORIF complicated by anemia s/p 2 u PRBC.  D/c may 14, spent 2 wks at rehab, now home for 3 days, getting HH , had labs drawn yesterday, not clear if hgb f/u though may have at [facility] per Dr Philip Aspen.  No overt bleeding or bruising. Pain overall controlled.   Pt denies fever, wt loss, night sweats, loss of appetite, or other constitutional symptoms, infact has gained a couple of lbs.  Denies urinary symptoms such as dysuria, frequency, urgency, flank pain, hematuria or n/v, fever, chills. Pt denies chest pain, increased sob or doe, wheezing, orthopnea, PND, increased LE swelling, palpitations, dizziness or syncope. Still on 2L home o2 continuous. Newly on symbicort, hard to say if helped.  States no longer seeing Dr Melvyn Novas.  Denies worsening depressive symptoms, suicidal ideation, or panic Past Medical History  Diagnosis Date  . Impaired glucose tolerance 01/07/2011  . ANXIETY 01/01/2007  . BURSITIS, RIGHT HIP 06/04/2009  . CAD (coronary artery disease)     a. BMS to LAD 2010. b. NSTEMI with DES to LAD for ISR 2011. c. Patent stent 03/2012/Imdur added.  . CHEST PAIN-PRECORDIAL 01/15/2009  . COPD 01/01/2007    a. Chronic resp failure on home O2.  Marland Kitchen DEPRESSION 01/01/2007  . GROIN PAIN 06/20/2008  . Headache(784.0) 01/01/2007  . HYPERTENSION 01/01/2007  . LOW BACK PAIN 01/01/2007  . Muscle weakness (generalized) 06/04/2009  . OSTEOARTHRITIS, HIP 07/01/2008  . OSTEOPOROSIS 01/01/2007  . SYNCOPE 01/01/2007  . TRANSIENT ISCHEMIC ATTACK, HX OF 01/01/2007  . Eczema 01/08/2011  . Rosacea 01/08/2011  . Colon polyps     H/o tubular adenoma of colon  . Hemorrhoid   . Pneumonia 1998  . Cataract   . Abnormal chest x-ray     03/2012: will need OP f/u.  Marland Kitchen TIA (transient ischemic attack)   . Anginal pain   . Shortness of breath    Past Surgical History  Procedure Laterality Date  .  Appendectomy    . Abdominal hysterectomy    . Oophorectomy      one ovary  . Sp lumbar disc surgury      [name]  . S/p bilat cataract  2010  . Coronary stent placement    . Colonoscopy  01/25/2002    tubular adenoma,hemorrhoids, hyperplastic  colon polyps  . Colonoscopy  02/17/2005    hemorrhoids  . Coronary angioplasty    . Back surgery    . Hip surgery Left     DR XU     PROXIMAL NECK   . Hip arthroplasty Left 10/01/2013    Procedure: ARTHROPLASTY UNIPOLAR   HIP;  Surgeon: Marianna Payment, MD;  Location: [facility];  Service: Orthopedics;  Laterality: Left;    reports that she quit smoking about 8 years ago. Her smoking use included Cigarettes. She has a 50 pack-year smoking history. She has never used smokeless tobacco. She reports that she does not drink alcohol or use illicit drugs. family history includes Cardiomyopathy in her sister; Colon cancer in an other family member; Heart attack in her sister; Heart disease in her sister; Osteoporosis in her mother; Seizures in her sister. Allergies  Allergen Reactions  . Aspirin Other (See Comments)     cns bleed risk  . Codeine Rash   Current Outpatient Prescriptions on File Prior  to Visit  Medication Sig Dispense Refill  . Cholecalciferol (VITAMIN D) 1000 UNITS capsule Take 1,000 Units by mouth every evening.       . clopidogrel (PLAVIX) 75 MG tablet Take 1 tablet (75 mg total) by mouth daily with breakfast.  14 tablet  0  . docusate sodium (COLACE) 100 MG capsule Take 100 mg by mouth daily as needed. For constipation      . HYDROcodone-acetaminophen (NORCO/VICODIN) 5-325 MG per tablet Take 1-2 tablets by mouth every 6 (six) hours as needed for moderate pain.  30 tablet  0  . lovastatin (MEVACOR) 20 MG tablet Take 1 tablet (20 mg total) by mouth at bedtime.  90 tablet  3  . metoprolol tartrate (LOPRESSOR) 12.5 mg TABS tablet Take 0.5 tablets (12.5 mg total) by mouth 2 (two) times daily.  30 each  0  . nitroGLYCERIN (NITROSTAT)  0.4 MG SL tablet Place 1 tablet (0.4 mg total) under the tongue every 5 (five) minutes as needed for chest pain (up to 3 doses).  25 tablet  4  . nortriptyline (PAMELOR) 10 MG capsule Take 10 mg by mouth.       . ondansetron (ZOFRAN) 4 MG tablet Take 4 mg by mouth 3 (three) times daily as needed. For nausea (take with tramadol)      . promethazine (PHENERGAN) 12.5 MG tablet Take 1 tablet (12.5 mg total) by mouth every 6 (six) hours as needed for nausea or vomiting.  30 tablet  0  . senna (SENOKOT) 8.6 MG TABS tablet Take 1 tablet (8.6 mg total) by mouth 2 (two) times daily.  120 each  0  . sorbitol 70 % SOLN Take 30 mLs by mouth daily as needed for moderate constipation.      Marland Kitchen SPIRIVA HANDIHALER 18 MCG inhalation capsule INHALE 1 CAPSULE VIA HANDIHALER ONCE DAILY AT THE SAME TIME EVERY DAY  90 capsule  3  . tiotropium (SPIRIVA) 18 MCG inhalation capsule Place 18 mcg into inhaler and inhale daily.      . traMADol (ULTRAM) 50 MG tablet Take 100 mg by mouth 3 (three) times daily as needed. For pain       No current facility-administered medications on file prior to visit.   Review of Systems  Constitutional: Negative for unusual diaphoresis or other sweats  HENT: Negative for ringing in ear Eyes: Negative for double vision or worsening visual disturbance.  Respiratory: Negative for choking and stridor.   Gastrointestinal: Negative for vomiting or other signifcant bowel change Genitourinary: Negative for hematuria or decreased urine volume.  Musculoskeletal: Negative for other MSK pain or swelling Skin: Negative for color change and worsening wound.  Neurological: Negative for tremors and numbness other than noted  Psychiatric/Behavioral: Negative for decreased concentration or agitation other than above       Objective:   Physical Exam BP 120/62  Pulse 73  Temp(Src) 98.1 F (36.7 C) (Oral)  Wt 122 lb (55.339 kg)  SpO2 96% VS noted, seated in wheelchair Constitutional: Pt appears  well-developed, well-nourished.  HENT: Head: NCAT.  Right Ear: External ear normal.  Left Ear: External ear normal.  Eyes: . Pupils are equal, round, and reactive to light. Conjunctivae and EOM are normal Neck: Normal range of motion. Neck supple.  Cardiovascular: Normal rate and regular rhythm.   Pulmonary/Chest: Effort normal and breath sounds decreased bilat, no rales or wheezing.  Abd:  Soft, NT, ND, + BS Neurological: Pt is alert. Not confused , motor grossly intact Skin:  Skin is warm. No rash, no LE edema Psychiatric: Pt behavior is normal. No agitation. not depressed affect     Assessment & Plan:

## 2013-10-24 NOTE — Assessment & Plan Note (Signed)
On plavix/coumadin - for f/u labs today Lab Results  Component Value Date   WBC 10.7* 10/04/2013   HGB 9.1* 10/04/2013   HCT 26.7* 10/04/2013   MCV 83.7 10/04/2013   PLT 189 10/04/2013

## 2013-10-24 NOTE — Patient Instructions (Addendum)
You had the new Prevnar pneumonia shot today  Please continue all other medications as before, and refills have been done if requested. Please have the pharmacy call with any other refills you may need.  Please continue your efforts at being more active, low cholesterol diet, and weight control.  Please go to the LAB in the Basement (turn left off the elevator) for the tests to be done today  You will be contacted by phone if any changes need to be made immediately.  Otherwise, you will receive a letter about your results with an explanation, but please check with MyChart first.  Please return in 3 months, or sooner if needed

## 2013-10-24 NOTE — Assessment & Plan Note (Signed)
stable overall by history and exam, recent data reviewed with pt, and pt to continue medical treatment as before,  to f/u any worsening symptoms or concerns Lab Results  Component Value Date   WBC 10.7* 10/04/2013   HGB 9.1* 10/04/2013   HCT 26.7* 10/04/2013   PLT 189 10/04/2013   GLUCOSE 94 10/04/2013   CHOL 160 07/18/2012   TRIG 271.0* 07/18/2012   HDL 53.20 07/18/2012   LDLDIRECT 63.8 07/18/2012   LDLCALC 66 04/13/2012   ALT <5 10/04/2013   AST 18 10/04/2013   NA 142 10/04/2013   K 4.3 10/04/2013   CL 108 10/04/2013   CREATININE 0.66 10/04/2013   BUN 13 10/04/2013   CO2 27 10/04/2013   TSH 0.79 01/14/2012   INR 1.18 10/23/2013   HGBA1C 5.3 07/18/2012

## 2013-10-24 NOTE — Addendum Note (Signed)
Addended by: Sharon Seller B on: 10/24/2013 02:52 PM   Modules accepted: Orders

## 2013-10-24 NOTE — Assessment & Plan Note (Signed)
stable overall by history and exam, recent data reviewed with pt, and pt to continue medical treatment as before,  to f/u any worsening symptoms or concerns BP Readings from Last 3 Encounters:  10/24/13 120/62  10/04/13 103/63  10/04/13 103/63

## 2013-10-24 NOTE — Progress Notes (Signed)
Pre visit review using our clinic review tool, if applicable. No additional management support is needed unless otherwise documented below in the visit note. 

## 2013-10-29 ENCOUNTER — Telehealth: Payer: Self-pay | Admitting: *Deleted

## 2013-10-29 NOTE — Telephone Encounter (Signed)
Pt called requesting whether she is still to take Robaxin, Ferrex, and Hydrocodone.  Pt states these medications ran out while she was in Rehab.  If she is to still take these meds she will need refills.  Please advise

## 2013-10-29 NOTE — Telephone Encounter (Signed)
I think ok to hold off on these for now, we can give tramadol for pain if she needs this, or tylenol if that helps as well

## 2013-10-30 ENCOUNTER — Ambulatory Visit (HOSPITAL_COMMUNITY)
Admission: RE | Admit: 2013-10-30 | Discharge: 2013-10-30 | Disposition: A | Discharge status: Home / Self Care | Payer: Medicare Other | Source: Ambulatory Visit | Attending: Orthopaedic Surgery | Admitting: Orthopaedic Surgery

## 2013-10-30 ENCOUNTER — Other Ambulatory Visit (HOSPITAL_COMMUNITY): Payer: Self-pay | Admitting: Orthopaedic Surgery

## 2013-10-30 DIAGNOSIS — M79609 Pain in unspecified limb: Secondary | ICD-10-CM

## 2013-10-30 DIAGNOSIS — M7989 Other specified soft tissue disorders: Secondary | ICD-10-CM | POA: Insufficient documentation

## 2013-10-30 DIAGNOSIS — J9610 Chronic respiratory failure, unspecified whether with hypoxia or hypercapnia: Secondary | ICD-10-CM

## 2013-10-30 DIAGNOSIS — R0600 Dyspnea, unspecified: Secondary | ICD-10-CM

## 2013-10-30 NOTE — Progress Notes (Signed)
Left lower extremity venous duplex completed.  Left:  No evidence of DVT, superficial thrombosis, or Baker's cyst.  Right:  Negative for DVT in the common femoral vein.  

## 2013-10-30 NOTE — Telephone Encounter (Signed)
Spoke with pt advised of MDs message 

## 2013-11-02 ENCOUNTER — Telehealth: Payer: Self-pay | Admitting: *Deleted

## 2013-11-02 NOTE — Telephone Encounter (Signed)
Called the patients husband informed of MD instructions on medication.

## 2013-11-02 NOTE — Telephone Encounter (Signed)
pts husband called requesting whether it is ok for pt to take Tramadol at the same times as she was taking Norco.  Norco runs out today;.  Please advise

## 2013-11-02 NOTE — Telephone Encounter (Signed)
Ok to transition from the norco to the tramadol as d/w pt at her last visit;  We do not usually take both at same time

## 2013-11-06 ENCOUNTER — Telehealth: Payer: Self-pay

## 2013-11-06 NOTE — Telephone Encounter (Signed)
Pt states that her tramadol rx is not working for her pain and wanted to know if there was another rx she could have to help with the pain

## 2013-11-07 DIAGNOSIS — R269 Unspecified abnormalities of gait and mobility: Secondary | ICD-10-CM

## 2013-11-07 DIAGNOSIS — Z7901 Long term (current) use of anticoagulants: Secondary | ICD-10-CM

## 2013-11-07 DIAGNOSIS — I10 Essential (primary) hypertension: Secondary | ICD-10-CM

## 2013-11-07 DIAGNOSIS — Z4789 Encounter for other orthopedic aftercare: Secondary | ICD-10-CM

## 2013-11-07 NOTE — Telephone Encounter (Signed)
Very sorry, but i no longer treat chronic pain as part of my practice with scheduled narcotics  I can refer to a pain management ctr if needed, thanks

## 2013-11-07 NOTE — Telephone Encounter (Signed)
Called informed the son of MD instructions.  He stated they has already called back her surgeon and he did refill her norco.  She is taking once daily and the tramadol at night.

## 2013-11-20 ENCOUNTER — Telehealth: Payer: Self-pay | Admitting: *Deleted

## 2013-11-20 NOTE — Telephone Encounter (Signed)
Left msg on triage stating she she has had some back stool. called pt back inform her will need to be evaluated. Made appt for 11/22/13@ 4:45...[name]

## 2013-11-22 ENCOUNTER — Encounter: Payer: Self-pay | Admitting: Internal Medicine

## 2013-11-22 ENCOUNTER — Encounter (HOSPITAL_COMMUNITY): Payer: Self-pay | Admitting: Emergency Medicine

## 2013-11-22 ENCOUNTER — Emergency Department (HOSPITAL_COMMUNITY): Payer: Medicare Other

## 2013-11-22 ENCOUNTER — Emergency Department (HOSPITAL_COMMUNITY)
Admission: EM | Admit: 2013-11-22 | Discharge: 2013-11-23 | Disposition: A | Discharge status: Home / Self Care | Payer: Medicare Other | Attending: Emergency Medicine | Admitting: Emergency Medicine

## 2013-11-22 ENCOUNTER — Ambulatory Visit (INDEPENDENT_AMBULATORY_CARE_PROVIDER_SITE_OTHER): Payer: Medicare Other | Admitting: Internal Medicine

## 2013-11-22 VITALS — BP 108/62 | HR 87 | Temp 99.3°F | Wt 110.4 lb

## 2013-11-22 DIAGNOSIS — R1032 Left lower quadrant pain: Secondary | ICD-10-CM

## 2013-11-22 DIAGNOSIS — Z872 Personal history of diseases of the skin and subcutaneous tissue: Secondary | ICD-10-CM | POA: Insufficient documentation

## 2013-11-22 DIAGNOSIS — R1013 Epigastric pain: Secondary | ICD-10-CM | POA: Insufficient documentation

## 2013-11-22 DIAGNOSIS — I10 Essential (primary) hypertension: Secondary | ICD-10-CM | POA: Insufficient documentation

## 2013-11-22 DIAGNOSIS — J449 Chronic obstructive pulmonary disease, unspecified: Secondary | ICD-10-CM

## 2013-11-22 DIAGNOSIS — Z8673 Personal history of transient ischemic attack (TIA), and cerebral infarction without residual deficits: Secondary | ICD-10-CM | POA: Insufficient documentation

## 2013-11-22 DIAGNOSIS — Z8701 Personal history of pneumonia (recurrent): Secondary | ICD-10-CM | POA: Insufficient documentation

## 2013-11-22 DIAGNOSIS — I2510 Atherosclerotic heart disease of native coronary artery without angina pectoris: Secondary | ICD-10-CM | POA: Insufficient documentation

## 2013-11-22 DIAGNOSIS — Z90710 Acquired absence of both cervix and uterus: Secondary | ICD-10-CM | POA: Insufficient documentation

## 2013-11-22 DIAGNOSIS — Z87891 Personal history of nicotine dependence: Secondary | ICD-10-CM | POA: Insufficient documentation

## 2013-11-22 DIAGNOSIS — Z8739 Personal history of other diseases of the musculoskeletal system and connective tissue: Secondary | ICD-10-CM | POA: Insufficient documentation

## 2013-11-22 DIAGNOSIS — Z8719 Personal history of other diseases of the digestive system: Secondary | ICD-10-CM | POA: Insufficient documentation

## 2013-11-22 DIAGNOSIS — Z9861 Coronary angioplasty status: Secondary | ICD-10-CM | POA: Insufficient documentation

## 2013-11-22 DIAGNOSIS — R195 Other fecal abnormalities: Secondary | ICD-10-CM | POA: Insufficient documentation

## 2013-11-22 DIAGNOSIS — Z860100 Personal history of colon polyps, unspecified: Secondary | ICD-10-CM | POA: Insufficient documentation

## 2013-11-22 DIAGNOSIS — K921 Melena: Secondary | ICD-10-CM | POA: Insufficient documentation

## 2013-11-22 DIAGNOSIS — J4489 Other specified chronic obstructive pulmonary disease: Secondary | ICD-10-CM | POA: Insufficient documentation

## 2013-11-22 DIAGNOSIS — Z79899 Other long term (current) drug therapy: Secondary | ICD-10-CM | POA: Insufficient documentation

## 2013-11-22 DIAGNOSIS — Z9089 Acquired absence of other organs: Secondary | ICD-10-CM | POA: Insufficient documentation

## 2013-11-22 DIAGNOSIS — Z8659 Personal history of other mental and behavioral disorders: Secondary | ICD-10-CM | POA: Insufficient documentation

## 2013-11-22 DIAGNOSIS — Z9889 Other specified postprocedural states: Secondary | ICD-10-CM | POA: Insufficient documentation

## 2013-11-22 LAB — POC OCCULT BLOOD, ED: Fecal Occult Bld: NEGATIVE

## 2013-11-22 LAB — COMPREHENSIVE METABOLIC PANEL
ALBUMIN: 3 g/dL — AB (ref 3.5–5.2)
ALT: 5 U/L (ref 0–35)
ANION GAP: 10 (ref 5–15)
AST: 14 U/L (ref 0–37)
Alkaline Phosphatase: 98 U/L (ref 39–117)
BUN: 7 mg/dL (ref 6–23)
CO2: 31 mEq/L (ref 19–32)
Calcium: 8.7 mg/dL (ref 8.4–10.5)
Chloride: 97 mEq/L (ref 96–112)
Creatinine, Ser: 0.8 mg/dL (ref 0.50–1.10)
GFR calc Af Amer: 83 mL/min — ABNORMAL LOW (ref >90)
GFR calc non Af Amer: 72 mL/min — ABNORMAL LOW (ref >90)
Glucose, Bld: 94 mg/dL (ref 70–99)
Potassium: 3.9 mEq/L (ref 3.7–5.3)
SODIUM: 138 meq/L (ref 137–147)
TOTAL PROTEIN: 6.1 g/dL (ref 6.0–8.3)
Total Bilirubin: 0.3 mg/dL (ref 0.3–1.2)

## 2013-11-22 LAB — URINE MICROSCOPIC-ADD ON

## 2013-11-22 LAB — CBC
HCT: 35.5 % — ABNORMAL LOW (ref 36.0–46.0)
HEMOGLOBIN: 11.5 g/dL — AB (ref 12.0–15.0)
MCH: 28.3 pg (ref 26.0–34.0)
MCHC: 32.4 g/dL (ref 30.0–36.0)
MCV: 87.2 fL (ref 78.0–100.0)
Platelets: 297 10*3/uL (ref 150–400)
RBC: 4.07 MIL/uL (ref 3.87–5.11)
RDW: 13.6 % (ref 11.5–15.5)
WBC: 7.8 10*3/uL (ref 4.0–10.5)

## 2013-11-22 LAB — URINALYSIS, ROUTINE W REFLEX MICROSCOPIC
BILIRUBIN URINE: NEGATIVE
GLUCOSE, UA: NEGATIVE mg/dL
Hgb urine dipstick: NEGATIVE
KETONES UR: NEGATIVE mg/dL
Nitrite: NEGATIVE
PH: 6 (ref 5.0–8.0)
Protein, ur: NEGATIVE mg/dL
SPECIFIC GRAVITY, URINE: 1.013 (ref 1.005–1.030)
Urobilinogen, UA: 1 mg/dL (ref 0.0–1.0)

## 2013-11-22 LAB — PROTIME-INR
INR: 1 (ref 0.00–1.49)
Prothrombin Time: 13.2 seconds (ref 11.6–15.2)

## 2013-11-22 MED ORDER — IOHEXOL 300 MG/ML  SOLN
80.0000 mL | Freq: Once | INTRAMUSCULAR | Status: AC | PRN
  Administered 2013-11-22: 80 mL via INTRAVENOUS

## 2013-11-22 MED ORDER — IOHEXOL 300 MG/ML  SOLN: 25.0000 mL | Freq: Once | INTRAMUSCULAR | Status: AC | PRN

## 2013-11-22 NOTE — Progress Notes (Signed)
Pre visit review using our clinic review tool, if applicable. No additional management support is needed unless otherwise documented below in the visit note. 

## 2013-11-22 NOTE — Assessment & Plan Note (Signed)
O/w stable, Please continue all other medications as before SpO2 Readings from Last 3 Encounters:  11/22/13 98%  10/24/13 96%  10/04/13 92%

## 2013-11-22 NOTE — Assessment & Plan Note (Signed)
With low temp and exam I suspect diverticulitis vs uti or other; would ideally need imaging to determine etiology, pt is referred to ER for this now

## 2013-11-22 NOTE — ED Notes (Signed)
Pt reports LLQ pain and black stools x 2-3 weeks. Seen by PCP and sent here for possible diverticulitis vs UTI. Pt denies urinary symptoms. Pt denies weakness, but states she hasn't been as active since having hip replacement in May. Pt AO x4. NAD.

## 2013-11-22 NOTE — Progress Notes (Signed)
Subjective:    Patient ID: Alexis Higgins, female    DOB: Jun 12, 1940, 73 y.o.   MRN: [id number]  HPI here to f/u with c/o 2-3 wks intermittent black stools (brown yest and today) but Pt denies chest pain, increased sob or doe, wheezing, orthopnea, PND, increased LE swelling, palpitations, dizziness or syncope.  Has had constipation as well, but worsening pain LLQ as well in last few days, now moderate to severe, has low grade temp today, and found trace heme + on DRE today.  Does not currently take iron (though has in past for acute blood los anemia), nor takes peptobismol Past Medical History  Diagnosis Date  . Impaired glucose tolerance 01/07/2011  . ANXIETY 01/01/2007  . BURSITIS, RIGHT HIP 06/04/2009  . CAD (coronary artery disease)     a. BMS to LAD 2010. b. NSTEMI with DES to LAD for ISR 2011. c. Patent stent 03/2012/Imdur added.  . CHEST PAIN-PRECORDIAL 01/15/2009  . COPD 01/01/2007    a. Chronic resp failure on home O2.  Marland Kitchen DEPRESSION 01/01/2007  . GROIN PAIN 06/20/2008  . Headache(784.0) 01/01/2007  . HYPERTENSION 01/01/2007  . LOW BACK PAIN 01/01/2007  . Muscle weakness (generalized) 06/04/2009  . OSTEOARTHRITIS, HIP 07/01/2008  . OSTEOPOROSIS 01/01/2007  . SYNCOPE 01/01/2007  . TRANSIENT ISCHEMIC ATTACK, HX OF 01/01/2007  . Eczema 01/08/2011  . Rosacea 01/08/2011  . Colon polyps     H/o tubular adenoma of colon  . Hemorrhoid   . Pneumonia 1998  . Cataract   . Abnormal chest x-ray     03/2012: will need OP f/u.  Marland Kitchen TIA (transient ischemic attack)   . Anginal pain   . Shortness of breath    Past Surgical History  Procedure Laterality Date  . Appendectomy    . Abdominal hysterectomy    . Oophorectomy      one ovary  . Sp lumbar disc surgury      [name]  . S/p bilat cataract  2010  . Coronary stent placement    . Colonoscopy  01/25/2002    tubular adenoma,hemorrhoids, hyperplastic  colon polyps  . Colonoscopy  02/17/2005    hemorrhoids  . Coronary angioplasty    .  Back surgery    . Hip surgery Left     DR XU     PROXIMAL NECK   . Hip arthroplasty Left 10/01/2013    Procedure: ARTHROPLASTY UNIPOLAR   HIP;  Surgeon: Marianna Payment, MD;  Location: [place];  Service: Orthopedics;  Laterality: Left;    reports that she quit smoking about 8 years ago. Her smoking use included Cigarettes. She has a 50 pack-year smoking history. She has never used smokeless tobacco. She reports that she does not drink alcohol or use illicit drugs. family history includes Cardiomyopathy in her sister; Colon cancer in an other family member; Heart attack in her sister; Heart disease in her sister; Osteoporosis in her mother; Seizures in her sister. Allergies  Allergen Reactions  . Aspirin Other (See Comments)     cns bleed risk  . Codeine Rash   Current Outpatient Prescriptions on File Prior to Visit  Medication Sig Dispense Refill  . budesonide-formoterol (SYMBICORT) 160-4.5 MCG/ACT inhaler Inhale 2 puffs into the lungs 2 (two) times daily.      . Cholecalciferol (VITAMIN D) 1000 UNITS capsule Take 1,000 Units by mouth every evening.       . clopidogrel (PLAVIX) 75 MG tablet Take 1 tablet (75 mg total) by  mouth daily with breakfast.  14 tablet  0  . docusate sodium (COLACE) 100 MG capsule Take 100 mg by mouth daily as needed. For constipation      . HYDROcodone-acetaminophen (NORCO/VICODIN) 5-325 MG per tablet Take 1-2 tablets by mouth every 6 (six) hours as needed for moderate pain.  30 tablet  0  . iron polysaccharides (FERREX 150) 150 MG capsule Take 150 mg by mouth daily.      Marland Kitchen lovastatin (MEVACOR) 20 MG tablet Take 1 tablet (20 mg total) by mouth at bedtime.  90 tablet  3  . methocarbamol (ROBAXIN) 500 MG tablet Take 500 mg by mouth 2 (two) times daily.      . metoprolol tartrate (LOPRESSOR) 12.5 mg TABS tablet Take 0.5 tablets (12.5 mg total) by mouth 2 (two) times daily.  30 each  0  . nitroGLYCERIN (NITROSTAT) 0.4 MG SL tablet Place 1 tablet (0.4 mg total) under the  tongue every 5 (five) minutes as needed for chest pain (up to 3 doses).  25 tablet  4  . nortriptyline (PAMELOR) 10 MG capsule Take 10 mg by mouth.       . ondansetron (ZOFRAN) 4 MG tablet Take 4 mg by mouth 3 (three) times daily as needed. For nausea (take with tramadol)      . promethazine (PHENERGAN) 12.5 MG tablet Take 1 tablet (12.5 mg total) by mouth every 6 (six) hours as needed for nausea or vomiting.  30 tablet  0  . senna (SENOKOT) 8.6 MG TABS tablet Take 1 tablet (8.6 mg total) by mouth 2 (two) times daily.  120 each  0  . sorbitol 70 % SOLN Take 30 mLs by mouth daily as needed for moderate constipation.      Marland Kitchen SPIRIVA HANDIHALER 18 MCG inhalation capsule INHALE 1 CAPSULE VIA HANDIHALER ONCE DAILY AT THE SAME TIME EVERY DAY  90 capsule  3  . tiotropium (SPIRIVA) 18 MCG inhalation capsule Place 18 mcg into inhaler and inhale daily.      . traMADol (ULTRAM) 50 MG tablet Take 100 mg by mouth 3 (three) times daily as needed. For pain      . warfarin (COUMADIN) 1 MG tablet Take 1 mg by mouth daily.       No current facility-administered medications on file prior to visit.   Review of Systems All otherwise neg per pt     Objective:   Physical Exam BP 108/62  Pulse 87  Temp(Src) 99.3 F (37.4 C) (Oral)  Wt 110 lb 6 oz (50.066 kg)  SpO2 98% VS noted, non toxic Constitutional: Pt appears well-developed, well-nourished.  HENT: Head: NCAT.  Right Ear: External ear normal.  Left Ear: External ear normal.  Eyes: . Pupils are equal, round, and reactive to light. Conjunctivae and EOM are normal Neck: Normal range of motion. Neck supple.  Cardiovascular: Normal rate and regular rhythm.   Pulmonary/Chest: Effort normal and breath sounds decreased bilat, no rales or wheezing.  Abd:  Soft, ND, + BS, mod tender LLQ, no guarding/no rebound DRE: no mass, little stool in vault, brownish, no gross BRB or black but trace heme + Neurological: Pt is alert. Not confused , motor grossly  intact Skin: Skin is warm. No rash Psychiatric: Pt behavior is normal. No agitation.     Assessment & Plan:

## 2013-11-22 NOTE — Assessment & Plan Note (Signed)
Subjective, but possible, needs CBC, which I cannot order at this time/lab closed - pt is referred to ER

## 2013-11-22 NOTE — Assessment & Plan Note (Signed)
Trace only today, but needs further acute w/u as above with hx of black stools

## 2013-11-22 NOTE — Patient Instructions (Signed)
Please continue all other medications as before, and refills have been done if requested.  Please have the pharmacy call with any other refills you may need.  Please go to ER [facility]) for further evaluation of the pain, fever, black stools.

## 2013-11-22 NOTE — ED Provider Notes (Signed)
CSN: [id number]     Arrival date & time 11/22/13  1824 History   First MD Initiated Contact with Patient 11/22/13 1919     Chief Complaint  Patient presents with  . Melena  . Abdominal Pain     (Consider location/radiation/quality/duration/timing/severity/associated sxs/prior Treatment) HPI Alexis Higgins is a 73 y.o. female who presents to the [facility] ER today. She was sent over by Cathlean Cower, MD who saw her earlier in the day for abdominal pain.  The patient describes that she has had constant abdominal pain for the past few months.  The pain is diffuse, constant and sore in nature.  She has a recent episode of severe pain last night after she ate dinner.  She describes as "extreme cramp all throughout my stomach"  She reports mild improvement of symptoms  Ut was unable to sleep last night because of the pain.  Today she visited her pcp and was found to have a Heme positive stool.  She has noticed her stools have been black for the past couple of months She used to take Prilosec for GERD but was instructed to stop b/c of an interaction with plavix 2 months ago.   Her last colonoscopy was two years ago and was negative.   Past Medical History  Diagnosis Date  . Impaired glucose tolerance 01/07/2011  . ANXIETY 01/01/2007  . BURSITIS, RIGHT HIP 06/04/2009  . CAD (coronary artery disease)     a. BMS to LAD 2010. b. NSTEMI with DES to LAD for ISR 2011. c. Patent stent 03/2012/Imdur added.  . CHEST PAIN-PRECORDIAL 01/15/2009  . COPD 01/01/2007    a. Chronic resp failure on home O2.  Marland Kitchen DEPRESSION 01/01/2007  . GROIN PAIN 06/20/2008  . Headache(784.0) 01/01/2007  . HYPERTENSION 01/01/2007  . LOW BACK PAIN 01/01/2007  . Muscle weakness (generalized) 06/04/2009  . OSTEOARTHRITIS, HIP 07/01/2008  . OSTEOPOROSIS 01/01/2007  . SYNCOPE 01/01/2007  . TRANSIENT ISCHEMIC ATTACK, HX OF 01/01/2007  . Eczema 01/08/2011  . Rosacea 01/08/2011  . Colon polyps     H/o tubular adenoma of colon  . Hemorrhoid   . Pneumonia  1998  . Cataract   . Abnormal chest x-ray     03/2012: will need OP f/u.  Marland Kitchen TIA (transient ischemic attack)   . Anginal pain   . Shortness of breath    Past Surgical History  Procedure Laterality Date  . Appendectomy    . Abdominal hysterectomy    . Oophorectomy      one ovary  . Sp lumbar disc surgury      [name]  . S/p bilat cataract  2010  . Coronary stent placement    . Colonoscopy  01/25/2002    tubular adenoma,hemorrhoids, hyperplastic  colon polyps  . Colonoscopy  02/17/2005    hemorrhoids  . Coronary angioplasty    . Back surgery    . Hip surgery Left     DR XU     PROXIMAL NECK   . Hip arthroplasty Left 10/01/2013    Procedure: ARTHROPLASTY UNIPOLAR   HIP;  Surgeon: Marianna Payment, MD;  Location: [place];  Service: Orthopedics;  Laterality: Left;   Family History  Problem Relation Age of Onset  . Osteoporosis Mother   . Heart disease Sister   . Colon cancer    . Seizures Sister     epilepsy  . Cardiomyopathy Sister   . Heart attack Sister    History  Substance Use Topics  .  Smoking status: Former Smoker -- 1.00 packs/day for 50 years    Types: Cigarettes    Quit date: 07/24/2005  . Smokeless tobacco: Never Used  . Alcohol Use: No   OB History   Grav Para Term Preterm Abortions TAB SAB Ect Mult Living                 Review of Systems  Constitutional: Positive for activity change and fatigue. Negative for fever, chills and diaphoresis.  HENT: Negative for trouble swallowing.   Respiratory: Negative for cough, chest tightness and shortness of breath.   Cardiovascular: Negative for chest pain.  Gastrointestinal: Positive for abdominal pain (Diffuse) and anal bleeding (melena). Negative for nausea, vomiting, diarrhea, constipation and abdominal distention.  Genitourinary: Negative for dysuria and hematuria.  Musculoskeletal: Negative for arthralgias and myalgias.  Skin: Negative for color change and rash.  Neurological: Negative for numbness.   All other systems reviewed and are negative.     Allergies  Aspirin and Codeine  Home Medications   Prior to Admission medications   Medication Sig Start Date End Date Taking? Authorizing Provider  budesonide-formoterol (SYMBICORT) 160-4.5 MCG/ACT inhaler Inhale 2 puffs into the lungs 2 (two) times daily.    Historical Provider, MD  Cholecalciferol (VITAMIN D) 1000 UNITS capsule Take 1,000 Units by mouth every evening.     Historical Provider, MD  clopidogrel (PLAVIX) 75 MG tablet Take 1 tablet (75 mg total) by mouth daily with breakfast. 10/04/13   Naiping Eduard Roux, MD  docusate sodium (COLACE) 100 MG capsule Take 100 mg by mouth daily as needed. For constipation    Historical Provider, MD  HYDROcodone-acetaminophen (NORCO/VICODIN) 5-325 MG per tablet Take 1-2 tablets by mouth every 6 (six) hours as needed for moderate pain. 10/04/13   Nita Sells, MD  iron polysaccharides (FERREX 150) 150 MG capsule Take 150 mg by mouth daily.    Historical Provider, MD  lovastatin (MEVACOR) 20 MG tablet Take 1 tablet (20 mg total) by mouth at bedtime. 01/16/13   Biagio Borg, MD  methocarbamol (ROBAXIN) 500 MG tablet Take 500 mg by mouth 2 (two) times daily.    Historical Provider, MD  metoprolol tartrate (LOPRESSOR) 12.5 mg TABS tablet Take 0.5 tablets (12.5 mg total) by mouth 2 (two) times daily. 10/04/13   Nita Sells, MD  nitroGLYCERIN (NITROSTAT) 0.4 MG SL tablet Place 1 tablet (0.4 mg total) under the tongue every 5 (five) minutes as needed for chest pain (up to 3 doses). 04/13/12   Dayna N Dunn, PA-C  nortriptyline (PAMELOR) 10 MG capsule Take 10 mg by mouth.  07/24/13   Historical Provider, MD  ondansetron (ZOFRAN) 4 MG tablet Take 4 mg by mouth 3 (three) times daily as needed. For nausea (take with tramadol) 06/21/11   Historical Provider, MD  promethazine (PHENERGAN) 12.5 MG tablet Take 1 tablet (12.5 mg total) by mouth every 6 (six) hours as needed for nausea or vomiting. 10/04/13    Nita Sells, MD  senna (SENOKOT) 8.6 MG TABS tablet Take 1 tablet (8.6 mg total) by mouth 2 (two) times daily. 10/04/13   Nita Sells, MD  sorbitol 70 % SOLN Take 30 mLs by mouth daily as needed for moderate constipation. 10/04/13   Nita Sells, MD  SPIRIVA HANDIHALER 18 MCG inhalation capsule INHALE 1 CAPSULE VIA HANDIHALER ONCE DAILY AT THE SAME TIME EVERY DAY 09/11/13   Biagio Borg, MD  tiotropium (SPIRIVA) 18 MCG inhalation capsule Place 18 mcg into inhaler and inhale daily.  Historical Provider, MD  traMADol (ULTRAM) 50 MG tablet Take 100 mg by mouth 3 (three) times daily as needed. For pain    Historical Provider, MD  warfarin (COUMADIN) 1 MG tablet Take 1 mg by mouth daily.    Historical Provider, MD   BP 96/58  Pulse 81  Temp(Src) 98.2 F (36.8 C) (Oral)  Resp 18  SpO2 94% Physical Exam  Nursing note and vitals reviewed. Constitutional: She is oriented to person, place, and time. She appears well-developed and well-nourished. No distress.  Eyes: Conjunctivae are normal. Pupils are equal, round, and reactive to light.  Cardiovascular: Normal rate, regular rhythm, normal heart sounds and intact distal pulses.  Exam reveals no gallop and no friction rub.   No murmur heard. Pulmonary/Chest: Effort normal and breath sounds normal. No respiratory distress. She has no wheezes.  Abdominal: Soft. Bowel sounds are normal. There is tenderness (mild, diffuse). There is no rebound and no guarding.  Neurological: She is alert and oriented to person, place, and time.    ED Course  Procedures (including critical care time) Labs Review Labs Reviewed  CBC - Abnormal; Notable for the following:    Hemoglobin 11.5 (*)    HCT 35.5 (*)    All other components within normal limits  COMPREHENSIVE METABOLIC PANEL - Abnormal; Notable for the following:    Albumin 3.0 (*)    GFR calc non Af Amer 72 (*)    GFR calc Af Amer 83 (*)    All other components within normal limits   URINE CULTURE  URINALYSIS, ROUTINE W REFLEX MICROSCOPIC  OCCULT BLOOD X 1 CARD TO LAB, STOOL  PROTIME-INR    Imaging Review No results found.   EKG Interpretation None      MDM   Final diagnoses:  Abdominal pain, LLQ    12:41 AM Filed Vitals:   11/22/13 2130 11/22/13 2200 11/22/13 2230 11/22/13 2300  BP: 108/57 125/62 140/56 119/85  Pulse: 72 66 74 72  Temp:      TempSrc:      Resp: 12 14 16 16   SpO2: 97% 100% 100% 100%    Negative FOBT.  Labs/ hgb unremarkable.  CT scan negative. Minimal pain on my exam. Patient is nontoxic, nonseptic appearing, in no apparent distress.  Patient's pain and other symptoms adequately managed in emergency department.  Fluid bolus given.  Labs, imaging and vitals reviewed.  Patient does not meet the SIRS or Sepsis criteria.  On repeat exam patient does not have a surgical abdomin and there are nor peritoneal signs.  No indication of appendicitis, bowel obstruction, bowel perforation, cholecystitis, diverticulitis,  Patient discharged home with symptomatic treatment and given strict instructions for follow-up with their primary care physician.  I have also discussed reasons to return immediately to the ER.  Patient expresses understanding and agrees with plan.       Margarita Mail, PA-C 11/23/13 [phone]

## 2013-11-22 NOTE — ED Notes (Signed)
Pt taken to CT.

## 2013-11-23 NOTE — Discharge Instructions (Signed)
Your work up here was negative for any acute abnormalities. The stool was negative for blood here. Her CT scan was also negative. Your hemoglobin which is a measure the amount of red blood cells in your blood was normal indicating that if you do have some bleeding in your bowel and it is not brisk and did not require blood transfusion were in admission to the hospital today. He should followup with [name] as soon as possible. He may refer you to a gastroenterologist for further workup should you continue to have bloody stools.  Please read the information below carefully regarding your workup, symptoms and return precautions to the emergency department.  Abdominal (belly) pain can be caused by many things. Your caregiver performed an examination and possibly ordered blood/urine tests and imaging (CT scan, x-rays, ultrasound). Many cases can be observed and treated at home after initial evaluation in the emergency department. Even though you are being discharged home, abdominal pain can be unpredictable. Therefore, you need a repeated exam if your pain does not resolve, returns, or worsens. Most patients with abdominal pain don't have to be admitted to the hospital or have surgery, but serious problems like appendicitis and gallbladder attacks can start out as nonspecific pain. Many abdominal conditions cannot be diagnosed in one visit, so follow-up evaluations are very important. SEEK IMMEDIATE MEDICAL ATTENTION IF: The pain does not go away or becomes severe.  A temperature above 101 develops.  Repeated vomiting occurs (multiple episodes).  The pain becomes localized to portions of the abdomen. The right side could possibly be appendicitis. In an adult, the left lower portion of the abdomen could be colitis or diverticulitis.  Blood is being passed in stools or vomit (bright red or black tarry stools).  Return also if you develop chest pain, difficulty breathing, dizziness or fainting, or become  confused, poorly responsive, or inconsolable (young children).    Abdominal Pain Many things can cause abdominal pain. Usually, abdominal pain is not caused by a disease and will improve without treatment. It can often be observed and treated at home. Your health care provider will do a physical exam and possibly order blood tests and X-rays to help determine the seriousness of your pain. However, in many cases, more time must pass before a clear cause of the pain can be found. Before that point, your health care provider may not know if you need more testing or further treatment. HOME CARE INSTRUCTIONS  Monitor your abdominal pain for any changes. The following actions may help to alleviate any discomfort you are experiencing:  Only take over-the-counter or prescription medicines as directed by your health care provider.  Do not take laxatives unless directed to do so by your health care provider.  Try a clear liquid diet (broth, tea, or water) as directed by your health care provider. Slowly move to a bland diet as tolerated. SEEK MEDICAL CARE IF:  You have unexplained abdominal pain.  You have abdominal pain associated with nausea or diarrhea.  You have pain when you urinate or have a bowel movement.  You experience abdominal pain that wakes you in the night.  You have abdominal pain that is worsened or improved by eating food.  You have abdominal pain that is worsened with eating fatty foods.  You have a fever. SEEK IMMEDIATE MEDICAL CARE IF:   Your pain does not go away within 2 hours.  You keep throwing up (vomiting).  Your pain is felt only in portions of  the abdomen, such as the right side or the left lower portion of the abdomen.  You pass bloody or black tarry stools. MAKE SURE YOU:  Understand these instructions.   Will watch your condition.   Will get help right away if you are not doing well or get worse.  Document Released: 02/17/2005 Document Revised:  05/15/2013 Document Reviewed: 01/17/2013 [facility] Patient Information 2015 [place], [place]. This information is not intended to replace advice given to you by your health care provider. Make sure you discuss any questions you have with your health care provider. Bloody Stools Bloody stools often mean that there is a problem in the digestive tract. Your caregiver may use the term "melena" to describe black, tarry, and bad smelling stools or "hematochezia" to describe red or maroon-colored stools. Blood seen in the stool can be caused by bleeding anywhere along the intestinal tract.  A black stool usually means that blood is coming from the upper part of the gastrointestinal tract (esophagus, stomach, or small bowel). Passing maroon-colored stools or bright red blood usually means that blood is coming from lower down in the large bowel or the rectum. However, sometimes massive bleeding in the stomach or small intestine can cause bright red bloody stools.  Consuming black licorice, lead, iron pills, medicines containing bismuth subsalicylate, or blueberries can also cause black stools. Your caregiver can test black stools to see if blood is present. It is important that the cause of the bleeding be found. Treatment can then be started, and the problem can be corrected. Rectal bleeding may not be serious, but you should not assume everything is okay until you know the cause.It is very important to follow up with your caregiver or a specialist in gastrointestinal problems. CAUSES  Blood in the stools can come from various underlying causes.Often, the cause is not found during your first visit. Testing is often needed to discover the cause of bleeding in the gastrointestinal tract. Causes range from simple to serious or even life-threatening.Possible causes include:  Hemorrhoids.These are veins that are full of blood (engorged) in the rectum. They cause pain, inflammation, and may bleed.  Anal  fissures.These are areas of painful tearing which may bleed. They are often caused by passing hard stool.  Diverticulosis.These are pouches that form on the colon over time, with age, and may bleed significantly.  Diverticulitis.This is inflammation in areas with diverticulosis. It can cause pain, fever, and bloody stools, although bleeding is rare.  Proctitis and colitis. These are inflamed areas of the rectum or colon. They may cause pain, fever, and bloody stools.  Polyps and cancer. Colon cancer is a leading cause of preventable cancer death.It often starts out as precancerous polyps that can be removed during a colonoscopy, preventing progression into cancer. Sometimes, polyps and cancer may cause rectal bleeding.  Gastritis and ulcers.Bleeding from the upper gastrointestinal tract (near the stomach) may travel through the intestines and produce black, sometimes tarry, often bad smelling stools. In certain cases, if the bleeding is fast enough, the stools may not be black, but red and the condition may be life-threatening. SYMPTOMS  You may have stools that are bright red and bloody, that are normal color with blood on them, or that are dark black and tarry. In some cases, you may only have blood in the toilet bowl. Any of these cases need medical care. You may also have:  Pain at the anus or anywhere in the rectum.  Lightheadedness or feeling faint.  Extreme weakness.  Nausea or vomiting.  Fever. DIAGNOSIS Your caregiver may use the following methods to find the cause of your bleeding:  Taking a medical history. Age is important. Older people tend to develop polyps and cancer more often. If there is anal pain and a hard, large stool associated with bleeding, a tear of the anus may be the cause. If blood drips into the toilet after a bowel movement, bleeding hemorrhoids may be the problem. The color and frequency of the bleeding are additional considerations. In most cases, the  medical history provides clues, but seldom the final answer.  A visual and finger (digital) exam. Your caregiver will inspect the anal area, looking for tears and hemorrhoids. A finger exam can provide information when there is tenderness or a growth inside. In men, the prostate is also examined.  Endoscopy. Several types of small, long scopes (endoscopes) are used to view the colon.  In the office, your caregiver may use a rigid, or more commonly, a flexible viewing sigmoidoscope. This exam is called flexible sigmoidoscopy. It is performed in 5 to 10 minutes.  A more thorough exam is accomplished with a colonoscope. It allows your caregiver to view the entire 5 to 6 foot long colon. Medicine to help you relax (sedative) is usually given for this exam. Frequently, a bleeding lesion may be present beyond the reach of the sigmoidoscope. So, a colonoscopy may be the best exam to start with. Both exams are usually done on an outpatient basis. This means the patient does not stay overnight in the hospital or surgery center.  An upper endoscopy may be needed to examine your stomach. Sedation is used and a flexible endoscope is put in your mouth, down to your stomach.  A barium enema X-ray. This is an X-ray exam. It uses liquid barium inserted by enema into the rectum. This test alone may not identify an actual bleeding point. X-rays highlight abnormal shadows, such as those made by lumps (tumors), diverticuli, or colitis. TREATMENT  Treatment depends on the cause of your bleeding.   For bleeding from the stomach or colon, the caregiver doing your endoscopy or colonoscopy may be able to stop the bleeding as part of the procedure.  Inflammation or infection of the colon can be treated with medicines.  Many rectal problems can be treated with creams, suppositories, or warm baths.  Surgery is sometimes needed.  Blood transfusions are sometimes needed if you have lost a lot of blood.  For any bleeding  problem, let your caregiver know if you take aspirin or other blood thinners regularly. HOME CARE INSTRUCTIONS   Take any medicines exactly as prescribed.  Keep your stools soft by eating a diet high in fiber. Prunes (1 to 3 a day) work well for many people.  Drink enough water and fluids to keep your urine clear or pale yellow.  Take sitz baths if advised. A sitz bath is when you sit in a bathtub with warm water for 10 to 15 minutes to soak, soothe, and cleanse the rectal area.  If enemas or suppositories are advised, be sure you know how to use them. Tell your caregiver if you have problems with this.  Monitor your bowel movements to look for signs of improvement or worsening. SEEK MEDICAL CARE IF:   You do not improve in the time expected.  Your condition worsens after initial improvement.  You develop any new symptoms. SEEK IMMEDIATE MEDICAL CARE IF:   You develop severe or prolonged rectal bleeding.  You vomit blood.  You feel weak or faint.  You have a fever. MAKE SURE YOU:  Understand these instructions.  Will watch your condition.  Will get help right away if you are not doing well or get worse. Document Released: 04/30/2002 Document Revised: 08/02/2011 Document Reviewed: 09/25/2010 [facility] Patient Information 2015 [place], [place]. This information is not intended to replace advice given to you by your health care provider. Make sure you discuss any questions you have with your health care provider.

## 2013-11-24 LAB — URINE CULTURE
CULTURE: NO GROWTH
Colony Count: NO GROWTH

## 2013-12-01 NOTE — ED Provider Notes (Signed)
Medical screening examination/treatment/procedure(s) were performed by non-physician practitioner and as supervising physician I was immediately available for consultation/collaboration.   EKG Interpretation None        Artisha Capri, MD 12/01/13 0721 

## 2013-12-14 ENCOUNTER — Telehealth: Payer: Self-pay | Admitting: Internal Medicine

## 2013-12-14 NOTE — Telephone Encounter (Signed)
Patient states stomach is hurting badly.  Mostly happens when she goes to bed.  Patient states she came in July 2nd for blood in her stool but now she is having different symptoms.  She would like to know if she should come in to see you or Dr. Carlean Purl.

## 2013-12-14 NOTE — Telephone Encounter (Signed)
Hard to know how quickly to advise being seen with this limited information, Ok for Dr Carlean Purl, if can be seen soon, or UC or ER if more mod or severe pain, or fever, n/v, or blood, or other unusual symptoms.  There is also Sat clinic in the AM (9-1) if she thinks pain is mild and no other significant other symtoms and she thinks she can wait

## 2014-01-21 ENCOUNTER — Telehealth: Payer: Self-pay | Admitting: Cardiovascular Disease

## 2014-01-21 NOTE — Telephone Encounter (Signed)
New message    Patient calling wants to come off plavix  7 days before procedure she having.   Dr. Hetty Ely office # [phone].

## 2014-01-21 NOTE — Telephone Encounter (Signed)
Spoke with pt who reports she is being scheduled for a procedure to burn nerves in her lower back to treat back pain. MD doing procedure is Dr. Greta Doom. I confirmed office number listed below. Will require 2 procedures. Procedures are scheduled for February 13, 2014 and February 27, 2014. Pt states she will need to hold Plavix for 7 days prior to procedure. She does not think she can resume in between procedures as she will get confused about when to take.  She is asking if she can hold Plavix starting 7 days prior to February 13, 2014 and resume after procedure on February 27, 2014.

## 2014-01-21 NOTE — Telephone Encounter (Signed)
Pt wants to hold Plavix starting 7 days prior to first procedure on February 13, 2014 and not resume until after second procedure on February 27, 2014. (she needs 2 separate treatments).  Can she hold Plavix this long?

## 2014-01-21 NOTE — Telephone Encounter (Signed)
Alexis Higgins can hold her Plavix for 5-7 days before her procedure and resume when safe from a cardiac standpoint. Darlina Guys

## 2014-01-21 NOTE — Telephone Encounter (Signed)
Yes. She can hold for that long if necessary. chris

## 2014-01-22 NOTE — Telephone Encounter (Signed)
Pt notified. Will fax this note to Dr. Caroline More

## 2014-01-23 ENCOUNTER — Ambulatory Visit: Payer: Medicare Other | Admitting: Internal Medicine

## 2014-02-01 ENCOUNTER — Encounter: Payer: Self-pay | Admitting: Internal Medicine

## 2014-02-01 ENCOUNTER — Other Ambulatory Visit (INDEPENDENT_AMBULATORY_CARE_PROVIDER_SITE_OTHER): Payer: Medicare Other

## 2014-02-01 ENCOUNTER — Ambulatory Visit (INDEPENDENT_AMBULATORY_CARE_PROVIDER_SITE_OTHER): Payer: Medicare Other | Admitting: Internal Medicine

## 2014-02-01 VITALS — BP 122/68 | HR 93 | Temp 98.3°F | Wt 116.5 lb

## 2014-02-01 DIAGNOSIS — R7309 Other abnormal glucose: Secondary | ICD-10-CM

## 2014-02-01 DIAGNOSIS — E785 Hyperlipidemia, unspecified: Secondary | ICD-10-CM

## 2014-02-01 DIAGNOSIS — Z23 Encounter for immunization: Secondary | ICD-10-CM

## 2014-02-01 DIAGNOSIS — J449 Chronic obstructive pulmonary disease, unspecified: Secondary | ICD-10-CM

## 2014-02-01 DIAGNOSIS — Z0000 Encounter for general adult medical examination without abnormal findings: Secondary | ICD-10-CM

## 2014-02-01 DIAGNOSIS — R7302 Impaired glucose tolerance (oral): Secondary | ICD-10-CM

## 2014-02-01 DIAGNOSIS — I10 Essential (primary) hypertension: Secondary | ICD-10-CM

## 2014-02-01 LAB — LIPID PANEL
Cholesterol: 143 mg/dL (ref 0–200)
HDL: 54.2 mg/dL (ref >39.00)
LDL Cholesterol: 57 mg/dL (ref 0–99)
NonHDL: 88.8
TRIGLYCERIDES: 159 mg/dL — AB (ref 0.0–149.0)
Total CHOL/HDL Ratio: 3
VLDL: 31.8 mg/dL (ref 0.0–40.0)

## 2014-02-01 LAB — URINALYSIS, ROUTINE W REFLEX MICROSCOPIC
Bilirubin Urine: NEGATIVE
Ketones, ur: NEGATIVE
NITRITE: NEGATIVE
PH: 7 (ref 5.0–8.0)
Specific Gravity, Urine: 1.01 (ref 1.000–1.030)
Total Protein, Urine: NEGATIVE
Urine Glucose: NEGATIVE
Urobilinogen, UA: 0.2 (ref 0.0–1.0)

## 2014-02-01 LAB — CBC WITH DIFFERENTIAL/PLATELET
BASOS PCT: 0.4 % (ref 0.0–3.0)
Basophils Absolute: 0 10*3/uL (ref 0.0–0.1)
EOS PCT: 4.5 % (ref 0.0–5.0)
Eosinophils Absolute: 0.4 10*3/uL (ref 0.0–0.7)
HCT: 36 % (ref 36.0–46.0)
Hemoglobin: 11.9 g/dL — ABNORMAL LOW (ref 12.0–15.0)
LYMPHS PCT: 16.7 % (ref 12.0–46.0)
Lymphs Abs: 1.7 10*3/uL (ref 0.7–4.0)
MCHC: 33.1 g/dL (ref 30.0–36.0)
MCV: 84 fl (ref 78.0–100.0)
MONO ABS: 0.6 10*3/uL (ref 0.1–1.0)
MONOS PCT: 6 % (ref 3.0–12.0)
NEUTROS PCT: 72.4 % (ref 43.0–77.0)
Neutro Abs: 7.3 10*3/uL (ref 1.4–7.7)
PLATELETS: 320 10*3/uL (ref 150.0–400.0)
RBC: 4.29 Mil/uL (ref 3.87–5.11)
RDW: 13.6 % (ref 11.5–15.5)
WBC: 10.1 10*3/uL (ref 4.0–10.5)

## 2014-02-01 LAB — HEPATIC FUNCTION PANEL
ALBUMIN: 3.3 g/dL — AB (ref 3.5–5.2)
ALT: 7 U/L (ref 0–35)
AST: 16 U/L (ref 0–37)
Alkaline Phosphatase: 82 U/L (ref 39–117)
Bilirubin, Direct: 0.1 mg/dL (ref 0.0–0.3)
TOTAL PROTEIN: 6.6 g/dL (ref 6.0–8.3)
Total Bilirubin: 0.3 mg/dL (ref 0.2–1.2)

## 2014-02-01 LAB — BASIC METABOLIC PANEL
BUN: 7 mg/dL (ref 6–23)
CHLORIDE: 97 meq/L (ref 96–112)
CO2: 34 mEq/L — ABNORMAL HIGH (ref 19–32)
Calcium: 9 mg/dL (ref 8.4–10.5)
Creatinine, Ser: 0.9 mg/dL (ref 0.4–1.2)
GFR: 62.12 mL/min (ref >60.00)
GLUCOSE: 91 mg/dL (ref 70–99)
POTASSIUM: 4.3 meq/L (ref 3.5–5.1)
Sodium: 137 mEq/L (ref 135–145)

## 2014-02-01 LAB — TSH: TSH: 1.12 u[IU]/mL (ref 0.35–4.50)

## 2014-02-01 NOTE — Progress Notes (Signed)
Subjective:    Patient ID: Alexis Higgins, female    DOB: 1941-02-04, 73 y.o.   MRN: [id number]  HPI  Here for wellness and f/u;  Overall doing ok;  Pt denies CP, worsening SOB, DOE, wheezing, orthopnea, PND, worsening LE edema, palpitations, dizziness or syncope.  Pt denies neurological change such as new headache, facial or extremity weakness.  Pt denies polydipsia, polyuria, or low sugar symptoms. Pt states overall good compliance with treatment and medications, good tolerability, and has been trying to follow lower cholesterol diet.  Pt denies worsening depressive symptoms, suicidal ideation or panic. No fever, night sweats, wt loss, loss of appetite, or other constitutional symptoms.  Pt states good ability with ADL's, has low fall risk, home safety reviewed and adequate, no other significant changes in hearing or vision, and only occasionally active with exercise.Has occas constipation with intermittent abd discomfort, last ct neg July 2015, taking the miralax.  Past Medical History  Diagnosis Date  . Impaired glucose tolerance 01/07/2011  . ANXIETY 01/01/2007  . BURSITIS, RIGHT HIP 06/04/2009  . CAD (coronary artery disease)     a. BMS to LAD 2010. b. NSTEMI with DES to LAD for ISR 2011. c. Patent stent 03/2012/Imdur added.  . CHEST PAIN-PRECORDIAL 01/15/2009  . COPD 01/01/2007    a. Chronic resp failure on home O2.  Marland Kitchen DEPRESSION 01/01/2007  . GROIN PAIN 06/20/2008  . Headache(784.0) 01/01/2007  . HYPERTENSION 01/01/2007  . LOW BACK PAIN 01/01/2007  . Muscle weakness (generalized) 06/04/2009  . OSTEOARTHRITIS, HIP 07/01/2008  . OSTEOPOROSIS 01/01/2007  . SYNCOPE 01/01/2007  . TRANSIENT ISCHEMIC ATTACK, HX OF 01/01/2007  . Eczema 01/08/2011  . Rosacea 01/08/2011  . Colon polyps     H/o tubular adenoma of colon  . Hemorrhoid   . Pneumonia 1998  . Cataract   . Abnormal chest x-ray     03/2012: will need OP f/u.  Marland Kitchen TIA (transient ischemic attack)   . Anginal pain   . Shortness of breath     Past Surgical History  Procedure Laterality Date  . Appendectomy    . Abdominal hysterectomy    . Oophorectomy      one ovary  . Sp lumbar disc surgury      [name]  . S/p bilat cataract  2010  . Coronary stent placement    . Colonoscopy  01/25/2002    tubular adenoma,hemorrhoids, hyperplastic  colon polyps  . Colonoscopy  02/17/2005    hemorrhoids  . Coronary angioplasty    . Back surgery    . Hip surgery Left     DR XU     PROXIMAL NECK   . Hip arthroplasty Left 10/01/2013    Procedure: ARTHROPLASTY UNIPOLAR   HIP;  Surgeon: Marianna Payment, MD;  Location: [place];  Service: Orthopedics;  Laterality: Left;    reports that she quit smoking about 8 years ago. Her smoking use included Cigarettes. She has a 50 pack-year smoking history. She has never used smokeless tobacco. She reports that she does not drink alcohol or use illicit drugs. family history includes Cardiomyopathy in her sister; Colon cancer in an other family member; Heart attack in her sister; Heart disease in her sister; Osteoporosis in her mother; Seizures in her sister. Allergies  Allergen Reactions  . Aspirin Other (See Comments)     cns bleed risk  . Codeine Rash   Current Outpatient Prescriptions on File Prior to Visit  Medication Sig Dispense Refill  .  Cholecalciferol (VITAMIN D) 1000 UNITS capsule Take 1,000 Units by mouth every evening.       . clopidogrel (PLAVIX) 75 MG tablet Take 1 tablet (75 mg total) by mouth daily with breakfast.  14 tablet  0  . docusate sodium (COLACE) 100 MG capsule Take 100 mg by mouth daily as needed. For constipation      . isosorbide mononitrate (IMDUR) 30 MG 24 hr tablet Take 15 mg by mouth every morning.      . lovastatin (MEVACOR) 20 MG tablet Take 1 tablet (20 mg total) by mouth at bedtime.  90 tablet  3  . metoprolol tartrate (LOPRESSOR) 12.5 mg TABS tablet Take 0.5 tablets (12.5 mg total) by mouth 2 (two) times daily.  30 each  0  . nitroGLYCERIN (NITROSTAT) 0.4 MG  SL tablet Place 1 tablet (0.4 mg total) under the tongue every 5 (five) minutes as needed for chest pain (up to 3 doses).  25 tablet  4  . nortriptyline (PAMELOR) 10 MG capsule Take 10 mg by mouth at bedtime.       . ondansetron (ZOFRAN) 4 MG tablet Take 4 mg by mouth 3 (three) times daily as needed. For nausea (take with tramadol)      . polyvinyl alcohol (LIQUIFILM TEARS) 1.4 % ophthalmic solution Place 1 drop into both eyes daily.      Marland Kitchen tiotropium (SPIRIVA) 18 MCG inhalation capsule Place 18 mcg into inhaler and inhale daily.      . traMADol (ULTRAM) 50 MG tablet Take 100 mg by mouth 3 (three) times daily as needed. For pain       No current facility-administered medications on file prior to visit.   Review of Systems Constitutional: Negative for increased diaphoresis, other activity, appetite or other siginficant weight change  HENT: Negative for worsening hearing loss, ear pain, facial swelling, mouth sores and neck stiffness.   Eyes: Negative for other worsening pain, redness or visual disturbance.  Respiratory: Negative for shortness of breath and wheezing.   Cardiovascular: Negative for chest pain and palpitations.  Gastrointestinal: Negative for diarrhea, blood in stool, abdominal distention or other pain Genitourinary: Negative for hematuria, flank pain or change in urine volume.  Musculoskeletal: Negative for myalgias or other joint complaints.  Skin: Negative for color change and wound.  Neurological: Negative for syncope and numbness. other than noted Hematological: Negative for adenopathy. or other swelling Psychiatric/Behavioral: Negative for hallucinations, self-injury, decreased concentration or other worsening agitation.      Objective:   Physical Exam BP 122/68  Pulse 93  Temp(Src) 98.3 F (36.8 C) (Oral)  Wt 116 lb 8 oz (52.844 kg)  SpO2 91% VS noted,  Constitutional: Pt is oriented to person, place, and time. Appears well-developed and well-nourished.  Head:  Normocephalic and atraumatic.  Right Ear: External ear normal.  Left Ear: External ear normal.  Nose: Nose normal.  Mouth/Throat: Oropharynx is clear and moist.  Eyes: Conjunctivae and EOM are normal. Pupils are equal, round, and reactive to light.  Neck: Normal range of motion. Neck supple. No JVD present. No tracheal deviation present.  Cardiovascular: Normal rate, regular rhythm, normal heart sounds and intact distal pulses.   Pulmonary/Chest: Effort normal and breath sounds without rales or wheezing , decr BS bilat Abdominal: Soft. Bowel sounds are normal. NT. No HSM  Musculoskeletal: Normal range of motion. Exhibits no edema.  Lymphadenopathy:  Has no cervical adenopathy.  Neurological: Pt is alert and oriented to person, place, and time. Pt has normal  reflexes. No cranial nerve deficit. Motor grossly intact Skin: Skin is warm and dry. No rash noted.  Psychiatric:  Has normal mood and affect. Behavior is normal.     Assessment & Plan:

## 2014-02-01 NOTE — Progress Notes (Signed)
Pre visit review using our clinic review tool, if applicable. No additional management support is needed unless otherwise documented below in the visit note. 

## 2014-02-01 NOTE — Patient Instructions (Signed)
You had the flu shot today  Please continue all other medications as before, and refills have been done if requested.  Please have the pharmacy call with any other refills you may need.  Please continue your efforts at being more active, low cholesterol diet, and weight control.  You are otherwise up to date with prevention measures today.  Please keep your appointments with your specialists as you may have planned  You will be contacted regarding the referral for: pulmonary  Please go to the LAB in the Basement (turn left off the elevator) for the tests to be done today  You will be contacted by phone if any changes need to be made immediately.  Otherwise, you will receive a letter about your results with an explanation, but please check with MyChart first.  Please remember to sign up for MyChart if you have not done so, as this will be important to you in the future with finding out test results, communicating by private email, and scheduling acute appointments online when needed.  Please return in 6 months, or sooner if needed

## 2014-02-02 NOTE — Assessment & Plan Note (Addendum)
stable overall by history and exam, recent data reviewed with pt, and pt to continue medical treatment as before,  to f/u any worsening symptoms or concerns SpO2 Readings from Last 3 Encounters:  02/01/14 91%  11/23/13 100%  11/22/13 98%  for pulm referral due to severity of dz

## 2014-02-02 NOTE — Assessment & Plan Note (Signed)
Also for a1c,  to f/u any worsening symptoms or concerns  

## 2014-02-02 NOTE — Assessment & Plan Note (Signed)

## 2014-02-04 ENCOUNTER — Telehealth: Payer: Self-pay | Admitting: Internal Medicine

## 2014-02-04 NOTE — Telephone Encounter (Signed)
Pt last saw MW 12/2012. Please advise MW thanks

## 2014-02-04 NOTE — Telephone Encounter (Signed)
Fine with me

## 2014-02-04 NOTE — Telephone Encounter (Signed)
There is a referral in Epic for patient. Patient needs to be seen for Chronic airway obstruction, dr Cathlean Cower is referring patient. Per note in referral- patient is requesting not to see MW.  Pt needs to switch doctors. Please call patient to make appointment.

## 2014-02-05 NOTE — Telephone Encounter (Signed)
Spoke with the pt and offered to schedule appt with [facility]  She declined at this time and states will have to call us back and have her spouse schedule this for her  Will close encounter

## 2014-02-05 NOTE — Telephone Encounter (Signed)
[place] are you willing to see this patient? Thanks.

## 2014-02-05 NOTE — Telephone Encounter (Signed)
Ok to see.  Will need to go into a consult slot as second opinion.

## 2014-03-01 ENCOUNTER — Encounter: Payer: Self-pay | Admitting: Pulmonary Disease

## 2014-03-01 ENCOUNTER — Ambulatory Visit (INDEPENDENT_AMBULATORY_CARE_PROVIDER_SITE_OTHER): Payer: Medicare Other | Admitting: Pulmonary Disease

## 2014-03-01 VITALS — BP 118/62 | HR 92 | Temp 97.7°F | Ht 66.0 in | Wt 114.0 lb

## 2014-03-01 DIAGNOSIS — J9611 Chronic respiratory failure with hypoxia: Secondary | ICD-10-CM

## 2014-03-01 DIAGNOSIS — J439 Emphysema, unspecified: Secondary | ICD-10-CM

## 2014-03-01 NOTE — Progress Notes (Signed)
   Subjective:    Patient ID: Alexis Higgins, female    DOB: April 11, 1941, 73 y.o.   MRN: [id number]  HPI The patient is a 73 year old female who I have been asked to see for management of COPD.  She has been diagnosed with severe airflow obstruction, and with her long history of smoking, this is most consistent with emphysema. She is on Spiriva daily, as well as 24-hour a day supplemental oxygen. She has been tried on Symbicort in the past and really did not see a difference in her breathing. Surprisingly, she rarely has an acute exacerbation, and typically has one or less per year. She will get short of breath just walking through her house, and is unable to go up even a few stairs without getting breathless. She denies any significant cough or mucus production. She has not been staying active, and admits to having severe deconditioning. She does have a history of coronary disease, and has had stenting. She has not had a recent cardiac evaluation.   Review of Systems  Constitutional: Negative for fever and unexpected weight change.  HENT: Negative for congestion, dental problem, ear pain, nosebleeds, postnasal drip, rhinorrhea, sinus pressure, sneezing, sore throat and trouble swallowing.   Eyes: Negative for redness and itching.  Respiratory: Positive for cough and shortness of breath. Negative for chest tightness and wheezing.   Cardiovascular: Negative for palpitations and leg swelling.  Gastrointestinal: Negative for nausea and vomiting.  Genitourinary: Negative for dysuria.  Musculoskeletal: Positive for arthralgias. Negative for joint swelling.  Skin: Negative for rash.  Neurological: Negative for headaches.  Hematological: Does not bruise/bleed easily.  Psychiatric/Behavioral: Positive for dysphoric mood. The patient is nervous/anxious.        Objective:   Physical Exam Constitutional:  Thin female, no acute distress  HENT:  Nares patent without discharge  Oropharynx without  exudate, palate and uvula are normal  Eyes:  Perrla, eomi, no scleral icterus  Neck:  No JVD, no TMG  Cardiovascular:  Normal rate, regular rhythm, no rubs or gallops.  No murmurs        Intact distal pulses  Pulmonary :  Very decreased breath sounds, no stridor or respiratory distress   No rales, rhonchi, or wheezing  Abdominal:  Soft, nondistended, bowel sounds present.  No tenderness noted.   Musculoskeletal:  No lower extremity edema noted.  Lymph Nodes:  No cervical lymphadenopathy noted  Skin:  No cyanosis noted  Neurologic:  Alert, appropriate, moves all 4 extremities without obvious deficit.         Assessment & Plan:

## 2014-03-01 NOTE — Assessment & Plan Note (Signed)
The patient has severe COPD by her pulmonary function studies, but does have a very significant improvement with albuterol. She is currently on a LAMA alone, and I would like to add an LABA to her regimen. Even though she has severe enough disease to consider adding an inhaled corticosteroid, the patient has very few acute exacerbations. Therefore, I will try her on stiolto to see if she sees a big difference. I have had a long discussion with her today about the pathophysiology of emphysema, and the various ways that we try and improve quality of life. She is being treated maximally, but is not participating in a conditioning program. I have stressed to her the importance of pulmonary rehabilitation, and offered to send her to the program at [facility].  She will consider this.

## 2014-03-01 NOTE — Patient Instructions (Signed)
Stop spiriva for now.  Will start on stiolto 2 inhalations each am everyday.  Try for 4 weeks, and give me a call with your thoughts on how things are going. Stay on oxygen Work on increased weight and conditioning.  Will refer you to the pulmonary rehab program if you are interested. followup with me again in 73mos if you are doing well.

## 2014-03-08 ENCOUNTER — Other Ambulatory Visit: Payer: Self-pay

## 2014-03-11 ENCOUNTER — Telehealth: Payer: Self-pay | Admitting: Pulmonary Disease

## 2014-03-11 NOTE — Telephone Encounter (Signed)
So she does not feel this is from the stiolto??.  Make sure she rinses her mouth well after using inhaler, and gargles and swallows.  Can try mucinex dm one bid until cough improves, but let us know if the cough continues.

## 2014-03-11 NOTE — Telephone Encounter (Signed)
Called spoke with pt. She is aware of recs. She will restart spiriva. Nothing further needed

## 2014-03-11 NOTE — Telephone Encounter (Signed)
Spoke with pt and spouse. She reports she does feel her symptoms are coming from the stiolto and does not want to continue on this. She is asking to go back to spiriva. She reports she did not have any of these symptoms prior to starting stiolto. Please advise [place] thanks

## 2014-03-11 NOTE — Telephone Encounter (Signed)
Ok to stop stiolto and go back to spiriva alone.

## 2014-03-11 NOTE — Telephone Encounter (Signed)
Called and spoke to pt. Pt was last seen on 03/01/2014 by [facility]. Pt was started on Stiolto. Pt stated since she has started taking the stiolto she has had a dry cough and mild hoarseness. Pt states her cough is throughout the day and not just with the medication administration. Pt denies SOB, CP/tightness, f/c/s. Pt requesting recs by [place].   [place] please advise.   Allergies  Allergen Reactions  . Aspirin Other (See Comments)     cns bleed risk  . Codeine Rash

## 2014-03-12 ENCOUNTER — Other Ambulatory Visit: Payer: Self-pay | Admitting: Cardiovascular Disease

## 2014-03-15 ENCOUNTER — Encounter: Payer: Self-pay | Admitting: Emergency Medicine

## 2014-03-15 ENCOUNTER — Telehealth: Payer: Self-pay | Admitting: Pulmonary Disease

## 2014-03-15 ENCOUNTER — Ambulatory Visit (INDEPENDENT_AMBULATORY_CARE_PROVIDER_SITE_OTHER): Payer: Medicare Other | Admitting: Emergency Medicine

## 2014-03-15 VITALS — BP 138/82 | HR 81 | Temp 98.0°F | Ht 66.0 in | Wt 119.0 lb

## 2014-03-15 DIAGNOSIS — J439 Emphysema, unspecified: Secondary | ICD-10-CM

## 2014-03-15 MED ORDER — PREDNISONE 10 MG PO TABS: ORAL_TABLET | ORAL | Status: DC

## 2014-03-15 NOTE — Patient Instructions (Signed)
Please continue your Spiriva daily Use albuterol as needed  Wear your oxygen at all times.  Take prednisone as directed until it is all gone.  Follow with Dr Gwenette Greet or Tammy Parrett next week

## 2014-03-15 NOTE — Progress Notes (Signed)
Acute office visit 03/15/14 --  73 yo woman, seen by Dr Gwenette Greet for COPD with severe AFL and a significant BD response. She was seen 10/9 and underwent a trial of changing Spiriva to [employer]. She took the medication for 5 days. She started to have cough, hoarseness fairly quickly. Non-productive. These have continued and she has evolved wheeze, dyspnea over the last 2-3 days. Last pm she experienced acute SOB, benefited some from albuterol. Her O2 is set on 2L/min.  No CP, no numbness or tingling.    Past Medical History  Diagnosis Date  . Impaired glucose tolerance 01/07/2011  . ANXIETY 01/01/2007  . BURSITIS, RIGHT HIP 06/04/2009  . CAD (coronary artery disease)     a. BMS to LAD 2010. b. NSTEMI with DES to LAD for ISR 2011. c. Patent stent 03/2012/Imdur added.  . CHEST PAIN-PRECORDIAL 01/15/2009  . COPD 01/01/2007    a. Chronic resp failure on home O2.  Marland Kitchen DEPRESSION 01/01/2007  . GROIN PAIN 06/20/2008  . Headache(784.0) 01/01/2007  . HYPERTENSION 01/01/2007  . LOW BACK PAIN 01/01/2007  . Muscle weakness (generalized) 06/04/2009  . OSTEOARTHRITIS, HIP 07/01/2008  . OSTEOPOROSIS 01/01/2007  . SYNCOPE 01/01/2007  . TRANSIENT ISCHEMIC ATTACK, HX OF 01/01/2007  . Eczema 01/08/2011  . Rosacea 01/08/2011  . Colon polyps     H/o tubular adenoma of colon  . Hemorrhoid   . Pneumonia 1998  . Cataract   . Abnormal chest x-ray     03/2012: will need OP f/u.  Marland Kitchen TIA (transient ischemic attack)   . Anginal pain   . Shortness of breath      Family History  Problem Relation Age of Onset  . Osteoporosis Mother   . Heart disease Sister   . Emphysema Sister   . Seizures Sister     epilepsy  . Cardiomyopathy Sister   . Heart attack Sister      History   Social History  . Marital Status: Married    Spouse Name: N/A    Number of Children: 4  . Years of Education: N/A   Occupational History  . disabled back since 2003    Social History Main Topics  . Smoking status: Former Smoker -- 1.00  packs/day for 50 years    Types: Cigarettes    Quit date: 07/24/2005  . Smokeless tobacco: Never Used  . Alcohol Use: No  . Drug Use: No  . Sexual Activity: Not on file   Other Topics Concern  . Not on file   Social History Narrative  . No narrative on file     Allergies  Allergen Reactions  . Aspirin Other (See Comments)     cns bleed risk  . Other     Stiloto - severe reaction - caused inability to breath  . Codeine Rash     Outpatient Prescriptions Prior to Visit  Medication Sig Dispense Refill  . ascorbic acid (VITAMIN C) 1000 MG tablet Take 1,000 mg by mouth daily.      . Cholecalciferol (VITAMIN D) 1000 UNITS capsule Take 1,000 Units by mouth every evening.       . clopidogrel (PLAVIX) 75 MG tablet Take 1 tablet (75 mg total) by mouth daily with breakfast.  14 tablet  0  . docusate sodium (COLACE) 100 MG capsule Take 100 mg by mouth daily as needed. For constipation      . isosorbide mononitrate (IMDUR) 30 MG 24 hr tablet TAKE 0.5 TABLETS (15 MG TOTAL) BY MOUTH  DAILY.  15 tablet  6  . lovastatin (MEVACOR) 20 MG tablet Take 1 tablet (20 mg total) by mouth at bedtime.  90 tablet  3  . metoprolol tartrate (LOPRESSOR) 12.5 mg TABS tablet Take 0.5 tablets (12.5 mg total) by mouth 2 (two) times daily.  30 each  0  . nitroGLYCERIN (NITROSTAT) 0.4 MG SL tablet Place 1 tablet (0.4 mg total) under the tongue every 5 (five) minutes as needed for chest pain (up to 3 doses).  25 tablet  4  . nortriptyline (PAMELOR) 10 MG capsule Take 10 mg by mouth at bedtime.       . ondansetron (ZOFRAN) 4 MG tablet Take 4 mg by mouth 3 (three) times daily as needed. For nausea (take with tramadol)      . polyvinyl alcohol (LIQUIFILM TEARS) 1.4 % ophthalmic solution Place 1 drop into both eyes daily.      Marland Kitchen tiotropium (SPIRIVA) 18 MCG inhalation capsule Place 18 mcg into inhaler and inhale daily.      . traMADol (ULTRAM) 50 MG tablet Take 100 mg by mouth 3 (three) times daily as needed. For pain       . isosorbide mononitrate (IMDUR) 30 MG 24 hr tablet Take 15 mg by mouth every morning.       No facility-administered medications prior to visit.    Filed Vitals:   03/15/14 1624  BP: 138/82  Pulse: 81  Temp: 98 F (36.7 C)  TempSrc: Oral  Height: 5\' 6"  (1.676 m)  Weight: 119 lb (53.978 kg)  SpO2: 94%   Gen: Pleasant, thin woman in wheelchair, in no distress,  normal affect  ENT: No lesions,  mouth clear,  oropharynx clear, no postnasal drip  Neck: No JVD, no TMG, no carotid bruits  Lungs: No use of accessory muscles, very distant with barely any breath sounds, no wheeze heard  Cardiovascular: RRR, heart sounds normal, no murmur or gallops, no peripheral edema  Musculoskeletal: No deformities, no cyanosis or clubbing  Neuro: alert, non focal  Skin: Warm, no lesions or rashes   COPD (chronic obstructive pulmonary disease) with emphysema New dyspnea with cough, chest tightness. Seemed to have been triggered by her cough after trial Stiolto? There is no wheeze - I can barely hear any breath sounds. Think it would be best to treat for an AE with a pred taper, have her follow next week in case there is something else causing her dyspnea.

## 2014-03-15 NOTE — Assessment & Plan Note (Signed)
New dyspnea with cough, chest tightness. Seemed to have been triggered by her cough after trial Stiolto? There is no wheeze - I can barely hear any breath sounds. Think it would be best to treat for an AE with a pred taper, have her follow next week in case there is something else causing her dyspnea.

## 2014-03-15 NOTE — Progress Notes (Deleted)
   Subjective:    Patient ID: Alexis Higgins, female    DOB: 1940-06-17, 73 y.o.   MRN: [id number]  HPI    Review of Systems     Objective:   Physical Exam        Assessment & Plan:

## 2014-03-15 NOTE — Telephone Encounter (Signed)
Spoke with the pt's spouse  He reports that the pt is having increased SOB and wheezing  He is concerned that meds are not helping  OV with RB at 4:15 pm today

## 2014-03-18 ENCOUNTER — Other Ambulatory Visit: Payer: Self-pay | Admitting: Cardiovascular Disease

## 2014-03-19 ENCOUNTER — Other Ambulatory Visit: Payer: Self-pay | Admitting: Internal Medicine

## 2014-03-22 ENCOUNTER — Ambulatory Visit (INDEPENDENT_AMBULATORY_CARE_PROVIDER_SITE_OTHER): Payer: Medicare Other | Admitting: Adult Health

## 2014-03-22 ENCOUNTER — Ambulatory Visit (INDEPENDENT_AMBULATORY_CARE_PROVIDER_SITE_OTHER)
Admission: RE | Admit: 2014-03-22 | Discharge: 2014-03-22 | Disposition: A | Discharge status: Home / Self Care | Payer: Medicare Other | Source: Ambulatory Visit | Attending: Adult Health | Admitting: Adult Health

## 2014-03-22 ENCOUNTER — Encounter: Payer: Self-pay | Admitting: Adult Health

## 2014-03-22 VITALS — BP 124/72 | HR 87 | Temp 98.3°F | Ht 66.0 in | Wt 118.0 lb

## 2014-03-22 DIAGNOSIS — J439 Emphysema, unspecified: Secondary | ICD-10-CM

## 2014-03-22 MED ORDER — AMOXICILLIN-POT CLAVULANATE 875-125 MG PO TABS: 1.0000 | ORAL_TABLET | Freq: Two times a day (BID) | ORAL | Status: AC

## 2014-03-22 NOTE — Progress Notes (Signed)
Subjective:    Patient ID: Alexis Higgins, female    DOB: [birth year], 73 y.o.   MRN: [id number]  HPI Acute office visit 03/15/14 --  73 yo woman, seen by Dr Gwenette Greet for COPD with severe AFL and a significant BD response. She was seen 10/9 and underwent a trial of changing Spiriva to [employer]. She took the medication for 5 days. She started to have cough, hoarseness fairly quickly. Non-productive. These have continued and she has evolved wheeze, dyspnea over the last 2-3 days. Last pm she experienced acute SOB, benefited some from albuterol. Her O2 is set on 2L/min.  No CP, no numbness or tingling.  >pred taper    03/22/2014 Acute OV  Complains of persistent cough, wheezing and congestion for over last 2 weeks.  Patient was seen last week for COPD exacerbation, treated with a prednisone taper. She has not seen much improvement in her symptoms. Coughing up thick yellow mucus.  Has had  Increased SABA use  Over last week.  She remains on Spiriva daily. Tried on Stiolto 1 month ago, could not tolerate.  Last chest x-ray in May showed chronic COPD changes. Patient denies any hemoptysis, orthopnea, PND, leg swelling, or fever  Past Medical History  Diagnosis Date  . Impaired glucose tolerance 01/07/2011  . ANXIETY 01/01/2007  . BURSITIS, RIGHT HIP 06/04/2009  . CAD (coronary artery disease)     a. BMS to LAD 2010. b. NSTEMI with DES to LAD for ISR 2011. c. Patent stent 03/2012/Imdur added.  . CHEST PAIN-PRECORDIAL 01/15/2009  . COPD 01/01/2007    a. Chronic resp failure on home O2.  Marland Kitchen DEPRESSION 01/01/2007  . GROIN PAIN 06/20/2008  . Headache(784.0) 01/01/2007  . HYPERTENSION 01/01/2007  . LOW BACK PAIN 01/01/2007  . Muscle weakness (generalized) 06/04/2009  . OSTEOARTHRITIS, HIP 07/01/2008  . OSTEOPOROSIS 01/01/2007  . SYNCOPE 01/01/2007  . TRANSIENT ISCHEMIC ATTACK, HX OF 01/01/2007  . Eczema 01/08/2011  . Rosacea 01/08/2011  . Colon polyps     H/o tubular adenoma of colon  . Hemorrhoid   .  Pneumonia 1998  . Cataract   . Abnormal chest x-ray     03/2012: will need OP f/u.  Marland Kitchen TIA (transient ischemic attack)   . Anginal pain   . Shortness of breath      Review of Systems Constitutional:   No  weight loss, night sweats,  Fevers, chills,  +fatigue, or  lassitude.  HEENT:   No headaches,  Difficulty swallowing,  Tooth/dental problems, or  Sore throat,                No sneezing, itching, ear ache +, nasal congestion, post nasal drip,   CV:  No chest pain,  Orthopnea, PND, swelling in lower extremities, anasarca, dizziness, palpitations, syncope.   GI  No heartburn, indigestion, abdominal pain, nausea, vomiting, diarrhea, change in bowel habits, loss of appetite, bloody stools.   Resp:  .  No chest wall deformity  Skin: no rash or lesions.  GU: no dysuria, change in color of urine, no urgency or frequency.  No flank pain, no hematuria   MS:  No joint pain or swelling.  No decreased range of motion.  No back pain.  Psych:  No change in mood or affect. No depression or anxiety.  No memory loss.          Objective:   Physical Exam GEN: A/Ox3; pleasant , NAD,thin and frail    HEENT:  [place]/AT,  EACs-clear, TMs-wnl, NOSE-clear, THROAT-clear, no lesions, no postnasal drip or exudate noted.   NECK:  Supple w/ fair ROM; no JVD; normal carotid impulses w/o bruits; no thyromegaly or nodules palpated; no lymphadenopathy.  RESP Decreased BS in bases no accessory muscle use, no dullness to percussion  CARD:  RRR, no m/r/g  , no peripheral edema, pulses intact, no cyanosis or clubbing.  GI:   Soft & nt; nml bowel sounds; no organomegaly or masses detected.  Musco: Warm bil, no deformities or joint swelling noted.   Neuro: alert, no focal deficits noted.    Skin: Warm, no lesions or rashes         Assessment & Plan:

## 2014-03-22 NOTE — Progress Notes (Signed)
Quick Note:  lmtcb ______ 

## 2014-03-22 NOTE — Patient Instructions (Signed)
Augmentin 875 mg twice daily, take with food. Finish prednisone taper as directed. Mucinex DM twice daily as needed for cough congestion. Exogenous x-ray today. Follow-up Dr. Gwenette Greet  in 6 weeks and as needed. Please contact office for sooner follow up if symptoms do not improve or worsen or seek emergency care

## 2014-03-22 NOTE — Addendum Note (Signed)
Addended by: Parke Poisson E on: 03/22/2014 03:17 PM   Modules accepted: Orders

## 2014-03-22 NOTE — Assessment & Plan Note (Signed)
Slow to resolve exacerbation  Check cxr   Plan  Augmentin 875 mg twice daily, take with food. Finish prednisone taper as directed. Mucinex DM twice daily as needed for cough congestion. Exogenous x-ray today. Follow-up Dr. Gwenette Greet  in 6 weeks and as needed. Please contact office for sooner follow up if symptoms do not improve or worsen or seek emergency care

## 2014-03-25 ENCOUNTER — Telehealth: Payer: Self-pay | Admitting: Adult Health

## 2014-03-25 NOTE — Progress Notes (Signed)
Ov reviewed, and agree with plan as outlined.  

## 2014-03-25 NOTE — Telephone Encounter (Signed)
Patient notified of results.  Verbalized understanding.  Nothing further needed.

## 2014-03-25 NOTE — Progress Notes (Signed)
Quick Note:  Pt aware per 11.2.15 phone note. ______

## 2014-03-25 NOTE — Telephone Encounter (Signed)
Per 10.30.15 cxr: Result Note     No sign of PNA     Cont w/ ov recs     Please contact office for sooner follow up if symptoms do not improve or worsen or seek emergency care     Called spoke with patient, advised of results / recs as stated by TP Pt voiced her understanding. Nothing further needed; will sign off.

## 2014-03-27 ENCOUNTER — Other Ambulatory Visit: Payer: Self-pay | Admitting: Certified Registered Nurse Anesthetist

## 2014-03-27 DIAGNOSIS — M810 Age-related osteoporosis without current pathological fracture: Secondary | ICD-10-CM

## 2014-04-24 ENCOUNTER — Ambulatory Visit: Payer: Medicare Other | Admitting: Physical Therapy

## 2014-05-02 ENCOUNTER — Encounter (HOSPITAL_COMMUNITY): Payer: Self-pay | Admitting: Cardiovascular Disease

## 2014-05-03 ENCOUNTER — Ambulatory Visit: Payer: Medicare Other | Admitting: Pulmonary Disease

## 2014-05-04 ENCOUNTER — Encounter: Payer: Self-pay | Admitting: Internal Medicine

## 2014-05-04 DIAGNOSIS — R0600 Dyspnea, unspecified: Secondary | ICD-10-CM

## 2014-05-06 ENCOUNTER — Telehealth: Payer: Self-pay | Admitting: *Deleted

## 2014-05-06 ENCOUNTER — Other Ambulatory Visit: Payer: Self-pay | Admitting: *Deleted

## 2014-05-06 DIAGNOSIS — R0600 Dyspnea, unspecified: Secondary | ICD-10-CM

## 2014-05-06 MED ORDER — LEVALBUTEROL TARTRATE 45 MCG/ACT IN AERO: 1.0000 | INHALATION_SPRAY | RESPIRATORY_TRACT | Status: DC | PRN

## 2014-05-06 NOTE — Telephone Encounter (Signed)
Pt called in needs refill on inhaler. Pt forgot to get Dr Alexis Higgins refill it when she was here in [place].

## 2014-05-06 NOTE — Telephone Encounter (Signed)
[place] Night - Client [facility] Patient Name: Alexis Higgins Gender: Female DOB: [birth year] Age: 73 Y 9 M 11 D Return Phone Number: [phone] (Primary) Address: [address] City/State/Zip: [place] [address] Client [facility] - Client Client Site [place] - Night Physician John, [place] Type Call Call Type Triage / Clinical Caller Name Tawonda Legaspi Relationship To Patient Spouse Return Phone Number [phone] (Primary) Chief Complaint Prescription Refill or Medication Request (non symptomatic) Initial Comment Caller states that his wife needs a refill on her puffer. XOPENEXHFA 45 mcg. Just had no idea this script was out. Nurse Assessment Nurse: Si Gaul, RN, Tuesday Date/Time Eilene Ghazi Time): 05/03/2014 7:12:00 PM Please select the assessment type ---Refill Additional Documentation ---Needs refill on Xopenex Does the patient have enough medication to last until the office opens? ---No Guidelines Guideline Title Affirmed Question Affirmed Notes Nurse Date/Time ([place] Time) Disp. Time Eilene Ghazi Time) Disposition Final User 05/03/2014 6:28:44 PM Send To Clinical Follow Up Russ Halo 05/03/2014 7:21:08 PM Called On-Call Provider Scalf, RN, Tuesday 05/03/2014 7:22:47 PM Call Completed Yes Scalf, RN, Tuesday After Care Instructions Given Call Event Type User Date / Time Description Comments User: Tuesday, Si Gaul, RN Date/Time Eilene Ghazi Time): 05/03/2014 7:22:36 PM RN called family back and provided information given by Dr. Deborra Medina. PLEASE NOTE: All timestamps contained within this report are represented as [place] Standard Time. CONFIDENTIALTY NOTICE: This fax transmission is intended only for the addressee. It contains information that is legally privileged, confidential or otherwise protected from use or disclosure. If you are not the intended recipient,  you are strictly prohibited from reviewing, disclosing, copying using or disseminating any of this information or taking any action in reliance on or regarding this information. If you have received this fax in error, please notify us immediately by telephone so that we can arrange for its return to [place]. Phone: [phone], Toll-Free: [phone], Fax: [phone] Page: 2 of 2 Call Id: [id number] [place] DateTime Result/Outcome Notes Arnette Norris [id number] 05/03/2014 7:21:08 PM Called On Call Provider - Samara Snide, Tanja [place] 05/03/2014 7:21:35 PM Spoke with On Call - General Arnette Norris states to have patient call office Monday or call the lung MD, she doesn't want [place] to fill the medication.

## 2014-05-06 NOTE — Telephone Encounter (Signed)
[facility]

## 2014-05-06 NOTE — Telephone Encounter (Signed)
Pt sent email needing new rx for [facility]openex [facility]

## 2014-05-07 ENCOUNTER — Other Ambulatory Visit: Payer: Self-pay

## 2014-05-07 MED ORDER — LEVALBUTEROL TARTRATE 45 MCG/ACT IN AERO: 2.0000 | INHALATION_SPRAY | Freq: Four times a day (QID) | RESPIRATORY_TRACT | Status: DC | PRN

## 2014-05-07 NOTE — Addendum Note (Signed)
Addended by: Biagio Borg on: 05/07/2014 08:29 AM   Modules accepted: Orders

## 2014-05-07 NOTE — Telephone Encounter (Signed)
Inhaler sent in per email request

## 2014-05-14 ENCOUNTER — Telehealth: Payer: Self-pay | Admitting: *Deleted

## 2014-05-14 NOTE — Telephone Encounter (Signed)
[place] Night - Client [facility] Patient Name: Alexis Higgins Gender: Female DOB: [birth year] Age: 73 Y 9 M 21 D Return Phone Number: [phone] (Primary) Address: [address] City/State/Zip: [place] [address] Client [facility] - Client Client Site [place] - Night Physician John, [place] Type Call Call Type Triage / Clinical Caller Name Domitila Stetler Relationship To Patient Spouse Return Phone Number [phone] (Primary) Chief Complaint Prescription Refill or Medication Request (non symptomatic) Initial Comment Caller states that his wife needs a refill on her puffer. XOPENEXHFA 45 mcg. Just had no idea this script was out. Nurse Assessment Nurse: Si Gaul, RN, Tuesday Date/Time Eilene Ghazi Time): 05/03/2014 7:12:00 PM Please select the assessment type ---Refill Additional Documentation ---Needs refill on Xopenex Does the patient have enough medication to last until the office opens? ---No Guidelines Guideline Title Affirmed Question Affirmed Notes Nurse Date/Time ([place] Time) Disp. Time Eilene Ghazi Time) Disposition Final User 05/03/2014 6:28:44 PM Send To Clinical Follow Up Russ Halo 05/03/2014 7:21:08 PM Called On-Call Provider Scalf, RN, Tuesday 05/03/2014 7:22:47 PM Call Completed Scalf, RN, Tuesday 05/14/2014 3:28:28 PM Clinical Call Yes Donne Anon, RN, [place] After Care Instructions Given Call Event Type User Date / Time Description Comments User: Tuesday, Si Gaul, RN Date/Time Eilene Ghazi Time): 05/03/2014 7:22:36 PM RN called family back and provided information given by Dr. Deborra Medina. PLEASE NOTE: All timestamps contained within this report are represented as [place] Standard Time. CONFIDENTIALTY NOTICE: This fax transmission is intended only for the addressee. It contains information that is legally privileged, confidential or otherwise protected  from use or disclosure. If you are not the intended recipient, you are strictly prohibited from reviewing, disclosing, copying using or disseminating any of this information or taking any action in reliance on or regarding this information. If you have received this fax in error, please notify us immediately by telephone so that we can arrange for its return to [place]. Phone: [phone], Toll-Free: [phone], Fax: [phone] Page: 2 of 2 Call Id: [id number] [place] DateTime Result/Outcome Notes Arnette Norris [id number] 05/03/2014 7:21:08 PM Called On Call Provider - Samara Snide, [place]ja [place] 05/03/2014 7:21:35 PM Spoke with On Call - General Arnette Norris states to have patient call office Monday or call the lung MD, she doesn't want [place] to fill the medication.

## 2014-05-31 ENCOUNTER — Encounter: Payer: Self-pay | Admitting: Pulmonary Disease

## 2014-05-31 ENCOUNTER — Encounter: Payer: Self-pay | Admitting: Internal Medicine

## 2014-05-31 ENCOUNTER — Ambulatory Visit (INDEPENDENT_AMBULATORY_CARE_PROVIDER_SITE_OTHER): Payer: Medicare Other | Admitting: Pulmonary Disease

## 2014-05-31 VITALS — BP 102/78 | HR 78 | Temp 97.9°F | Ht 66.0 in | Wt 117.2 lb

## 2014-05-31 DIAGNOSIS — J438 Other emphysema: Secondary | ICD-10-CM | POA: Diagnosis not present

## 2014-05-31 DIAGNOSIS — J9611 Chronic respiratory failure with hypoxia: Secondary | ICD-10-CM

## 2014-05-31 NOTE — Patient Instructions (Signed)
No change in your current breathing medications.  Let us know when you are ready to try other medications added to your spiriva to see if we can improve your breathing.  Stay on your oxygen Try to stay as active as you can. followup with me again in 27mos.

## 2014-05-31 NOTE — Progress Notes (Signed)
   Subjective:    Patient ID: Alexis Higgins, female    DOB: 1941-03-08, 74 y.o.   MRN: [id number]  HPI The patient comes in today for follow-up of her known severe COPD with chronic respiratory failure. She was last seen in October of last year where her Spiriva was changed to stiolto. She feels that she had a reaction to this, and cannot tolerate. She currently is on Spiriva alone, and from her history it sounds like her exertional tolerance and overall pulmonary status is stable. She has not had a recent acute exacerbation or pulmonary infection. She is currently battling multiple musculoskeletal issues.   Review of Systems  Constitutional: Negative for fever and unexpected weight change.  HENT: Positive for dental problem and nosebleeds. Negative for congestion, ear pain, postnasal drip, rhinorrhea, sinus pressure, sneezing, sore throat and trouble swallowing.   Eyes: Negative for redness and itching.  Respiratory: Positive for shortness of breath. Negative for cough, chest tightness and wheezing.   Cardiovascular: Positive for leg swelling. Negative for palpitations.  Gastrointestinal: Negative for nausea and vomiting.  Genitourinary: Negative for dysuria.  Musculoskeletal: Negative for joint swelling.  Skin: Negative for rash.  Neurological: Negative for headaches.  Hematological: Does not bruise/bleed easily.  Psychiatric/Behavioral: Negative for dysphoric mood. The patient is not nervous/anxious.        Objective:   Physical Exam Thin female in no acute distress Nose without purulence or discharge noted Neck without lymphadenopathy or thyromegaly Chest with decreased breath sounds diffusely, no active wheezing Cardiac exam with regular rate and rhythm Lower extremities with minimal edema, no cyanosis Alert and oriented, moves all 4 extremities.       Assessment & Plan:

## 2014-05-31 NOTE — Assessment & Plan Note (Signed)
The patient has been at a fairly stable baseline since October of last year. She could not tolerate stiolto, and currently is on Spiriva alone. I really think she would benefit from an LABA, and possibly an inhaled corticosteroid. I have discussed with her trying different combinations of medicine to try and maximize her remaining pulmonary function. She is really not interested in doing this right now, but will let us know when she is ready. In the meantime I have asked her to stay on her Spiriva and oxygen, and to try and stay as active as possible. At least she is not having acute exacerbations or frequent pulmonary infections.

## 2014-06-03 MED ORDER — HYDROCORTISONE BUTYRATE 0.1 % EX CREA: 1.0000 "application " | TOPICAL_CREAM | Freq: Two times a day (BID) | CUTANEOUS | Status: DC | PRN

## 2014-06-11 ENCOUNTER — Telehealth: Payer: Self-pay | Admitting: Internal Medicine

## 2014-06-11 MED ORDER — HYDROCORTISONE BUTYRATE 0.1 % EX CREA: 1.0000 "application " | TOPICAL_CREAM | Freq: Two times a day (BID) | CUTANEOUS | Status: DC | PRN

## 2014-06-11 NOTE — Telephone Encounter (Signed)
Cream done erx

## 2014-06-11 NOTE — Telephone Encounter (Signed)
Pt called stated [facility] on [address] stated she would need a new rx for hydrocortisone butyrate to be send in. Please advise.

## 2014-06-16 DIAGNOSIS — J449 Chronic obstructive pulmonary disease, unspecified: Secondary | ICD-10-CM | POA: Diagnosis not present

## 2014-06-21 DIAGNOSIS — M25552 Pain in left hip: Secondary | ICD-10-CM | POA: Diagnosis not present

## 2014-07-05 DIAGNOSIS — G894 Chronic pain syndrome: Secondary | ICD-10-CM | POA: Diagnosis not present

## 2014-07-05 DIAGNOSIS — G4700 Insomnia, unspecified: Secondary | ICD-10-CM | POA: Diagnosis not present

## 2014-07-05 DIAGNOSIS — M961 Postlaminectomy syndrome, not elsewhere classified: Secondary | ICD-10-CM | POA: Diagnosis not present

## 2014-07-05 DIAGNOSIS — M47817 Spondylosis without myelopathy or radiculopathy, lumbosacral region: Secondary | ICD-10-CM | POA: Diagnosis not present

## 2014-07-17 DIAGNOSIS — J449 Chronic obstructive pulmonary disease, unspecified: Secondary | ICD-10-CM | POA: Diagnosis not present

## 2014-07-31 ENCOUNTER — Ambulatory Visit: Payer: Medicare Other | Admitting: Cardiovascular Disease

## 2014-08-02 ENCOUNTER — Encounter: Payer: Self-pay | Admitting: Internal Medicine

## 2014-08-02 ENCOUNTER — Ambulatory Visit (INDEPENDENT_AMBULATORY_CARE_PROVIDER_SITE_OTHER): Payer: Medicare Other | Admitting: Internal Medicine

## 2014-08-02 VITALS — BP 116/72 | HR 77 | Resp 18 | Ht 66.0 in | Wt 117.1 lb

## 2014-08-02 DIAGNOSIS — Z0189 Encounter for other specified special examinations: Secondary | ICD-10-CM

## 2014-08-02 DIAGNOSIS — E785 Hyperlipidemia, unspecified: Secondary | ICD-10-CM

## 2014-08-02 DIAGNOSIS — Z720 Tobacco use: Secondary | ICD-10-CM | POA: Diagnosis not present

## 2014-08-02 DIAGNOSIS — I739 Peripheral vascular disease, unspecified: Secondary | ICD-10-CM | POA: Diagnosis not present

## 2014-08-02 DIAGNOSIS — Z0000 Encounter for general adult medical examination without abnormal findings: Secondary | ICD-10-CM

## 2014-08-02 DIAGNOSIS — I10 Essential (primary) hypertension: Secondary | ICD-10-CM | POA: Diagnosis not present

## 2014-08-02 DIAGNOSIS — F17200 Nicotine dependence, unspecified, uncomplicated: Secondary | ICD-10-CM | POA: Insufficient documentation

## 2014-08-02 DIAGNOSIS — R7302 Impaired glucose tolerance (oral): Secondary | ICD-10-CM | POA: Diagnosis not present

## 2014-08-02 MED ORDER — TRIAMCINOLONE ACETONIDE 0.025 % EX CREA: 1.0000 "application " | TOPICAL_CREAM | Freq: Two times a day (BID) | CUTANEOUS | Status: DC

## 2014-08-02 NOTE — Assessment & Plan Note (Signed)
stable overall by history and exam, recent data reviewed with pt, and pt to continue medical treatment as before,  to f/u any worsening symptoms or concerns Lab Results  Component Value Date   HGBA1C 5.3 07/18/2012

## 2014-08-02 NOTE — Progress Notes (Signed)
Subjective:    Patient ID: Alexis Higgins, female    DOB: 1940/12/23, 74 y.o.   MRN: [id number]  HPI  Here to f/u; overall doing ok,  Pt denies chest pain, increasing sob or doe, wheezing, orthopnea, PND, increased LE swelling, palpitations, dizziness or syncope.  Pt denies new neurological symptoms such as new headache, or facial or extremity weakness or numbness.  Pt denies polydipsia, polyuria, or low sugar episode.   Pt denies new neurological symptoms such as new headache, or facial or extremity weakness or numbness.   Pt states overall good compliance with meds, mostly trying to follow appropriate diet, with wt overall stable,  but little exercise however. Has cool right foot, quite bluish distal foot, concerned about circulation. Smoked 1 ppd x 50 yrs, quit x 8 yrs. For start low dose CT lung Ca screening;  Per CMS guidelines, I have determined the eligibility including patient age (48-80), and absence of any signs of lung cancer.  Specific calculation of number of pack years is documented.  Quit smoking years is documented.  Shared decision making engaged today, including a discussion of the benefits and harms of screening, discussion of need for followup with additional testing, risks of over-diagnosis, risk of false-positive screening examinations, and risk of radiation exposure.  Counseling done today on the importance of adherence to annual lunge cancer LDCT screening, importance of quit smoking and remaining quit, and tobacco cessation instructions given.  There is also discussion with patient regarding the impact of comorbidities, and patient states is able and willing to undergo diagnosis and treatment. Past Medical History  Diagnosis Date  . Impaired glucose tolerance 01/07/2011  . ANXIETY 01/01/2007  . BURSITIS, RIGHT HIP 06/04/2009  . CAD (coronary artery disease)     a. BMS to LAD 2010. b. NSTEMI with DES to LAD for ISR 2011. c. Patent stent 03/2012/Imdur added.  . CHEST  PAIN-PRECORDIAL 01/15/2009  . COPD 01/01/2007    a. Chronic resp failure on home O2.  Marland Kitchen DEPRESSION 01/01/2007  . GROIN PAIN 06/20/2008  . Headache(784.0) 01/01/2007  . HYPERTENSION 01/01/2007  . LOW BACK PAIN 01/01/2007  . Muscle weakness (generalized) 06/04/2009  . OSTEOARTHRITIS, HIP 07/01/2008  . OSTEOPOROSIS 01/01/2007  . SYNCOPE 01/01/2007  . TRANSIENT ISCHEMIC ATTACK, HX OF 01/01/2007  . Eczema 01/08/2011  . Rosacea 01/08/2011  . Colon polyps     H/o tubular adenoma of colon  . Hemorrhoid   . Pneumonia 1998  . Cataract   . Abnormal chest x-ray     03/2012: will need OP f/u.  Marland Kitchen TIA (transient ischemic attack)   . Anginal pain   . Shortness of breath    Past Surgical History  Procedure Laterality Date  . Appendectomy    . Abdominal hysterectomy    . Oophorectomy      one ovary  . Sp lumbar disc surgury      [name]  . S/p bilat cataract  2010  . Coronary stent placement    . Colonoscopy  01/25/2002    tubular adenoma,hemorrhoids, hyperplastic  colon polyps  . Colonoscopy  02/17/2005    hemorrhoids  . Coronary angioplasty    . Back surgery    . Hip surgery Left     DR XU     PROXIMAL NECK   . Hip arthroplasty Left 10/01/2013    Procedure: ARTHROPLASTY UNIPOLAR   HIP;  Surgeon: Marianna Payment, MD;  Location: [place];  Service: Orthopedics;  Laterality: Left;  .  Breast enhancement surgery    . Left heart catheterization with coronary angiogram N/A 04/13/2012    Procedure: LEFT HEART CATHETERIZATION WITH CORONARY ANGIOGRAM;  Surgeon: Burnell Blanks, MD;  Location: [facility] CATH LAB;  Service: Cardiovascular;  Laterality: N/A;    reports that she quit smoking about 9 years ago. Her smoking use included Cigarettes. She has a 50 pack-year smoking history. She has never used smokeless tobacco. She reports that she does not drink alcohol or use illicit drugs. family history includes Cardiomyopathy in her sister; Emphysema in her sister; Heart attack in her sister; Heart disease  in her sister; Osteoporosis in her mother; Seizures in her sister. Allergies  Allergen Reactions  . Aspirin Other (See Comments)     cns bleed risk  . Other     Stiloto - severe reaction - caused inability to breath  . Codeine Rash   Current Outpatient Prescriptions on File Prior to Visit  Medication Sig Dispense Refill  . ascorbic acid (VITAMIN C) 1000 MG tablet Take 1,000 mg by mouth daily.    . Cholecalciferol (VITAMIN D) 1000 UNITS capsule Take 1,000 Units by mouth every evening.     . clopidogrel (PLAVIX) 75 MG tablet Take 1 tablet (75 mg total) by mouth daily with breakfast. 14 tablet 0  . docusate sodium (COLACE) 100 MG capsule Take 100 mg by mouth daily as needed. For constipation    . hydrocortisone butyrate (LUCOID) 0.1 % CREA cream Apply 1 application topically 2 (two) times daily as needed. 45 g 0  . isosorbide mononitrate (IMDUR) 30 MG 24 hr tablet TAKE 0.5 TABLETS (15 MG TOTAL) BY MOUTH DAILY. 15 tablet 6  . levalbuterol (XOPENEX HFA) 45 MCG/ACT inhaler Inhale 2 puffs into the lungs every 6 (six) hours as needed for wheezing. 1 Inhaler 12  . lovastatin (MEVACOR) 20 MG tablet TAKE 1 TABLET BY MOUTH AT BEDTIME 90 tablet 3  . metoprolol tartrate (LOPRESSOR) 25 MG tablet TAKE 1/2 TABLET BY MOUTH 2 TIMES A DAY 30 tablet 5  . nitroGLYCERIN (NITROSTAT) 0.4 MG SL tablet Place 1 tablet (0.4 mg total) under the tongue every 5 (five) minutes as needed for chest pain (up to 3 doses). 25 tablet 4  . nortriptyline (PAMELOR) 10 MG capsule Take 10 mg by mouth at bedtime.     . ondansetron (ZOFRAN) 4 MG tablet Take 4 mg by mouth 3 (three) times daily as needed. For nausea (take with tramadol)    . polyvinyl alcohol (LIQUIFILM TEARS) 1.4 % ophthalmic solution Place 1 drop into both eyes daily.    Marland Kitchen tiotropium (SPIRIVA) 18 MCG inhalation capsule Place 18 mcg into inhaler and inhale daily.    . traMADol (ULTRAM) 50 MG tablet Take 100 mg by mouth 3 (three) times daily as needed. For pain     No  current facility-administered medications on file prior to visit.   Review of Systems  Constitutional: Negative for unusual diaphoresis or night sweats HENT: Negative for ringing in ear or discharge Eyes: Negative for double vision or worsening visual disturbance.  Respiratory: Negative for choking and stridor.   Gastrointestinal: Negative for vomiting or other signifcant bowel change Genitourinary: Negative for hematuria or change in urine volume.  Musculoskeletal: Negative for other MSK pain or swelling Skin: Negative for color change and worsening wound.  Neurological: Negative for tremors and numbness other than noted  Psychiatric/Behavioral: Negative for decreased concentration or agitation other than above       Objective:   Physical  Exam BP 116/72 mmHg  Pulse 77  Resp 18  Ht 5\' 6"  (1.676 m)  Wt 117 lb 1.3 oz (53.107 kg)  BMI 18.91 kg/m2  SpO2 95% VS noted,  Constitutional: Pt appears in no significant distress HENT: Head: NCAT.  Right Ear: External ear normal.  Left Ear: External ear normal.  Eyes: . Pupils are equal, round, and reactive to light. Conjunctivae and EOM are normal Neck: Normal range of motion. Neck supple.  Cardiovascular: Normal rate and regular rhythm.   Pulmonary/Chest: Effort normal and breath sounds decreased without rales or wheezing.  Neurological: Pt is alert. Not confused , motor grossly intact Skin: Skin is warm. No rash, no LE edema, right dorsalis pedis trace, 1+ on left Psychiatric: Pt behavior is normal. No agitation.     Assessment & Plan:

## 2014-08-02 NOTE — Progress Notes (Signed)
Pre visit review using our clinic review tool, if applicable. No additional management support is needed unless otherwise documented below in the visit note. 

## 2014-08-02 NOTE — Patient Instructions (Signed)
Please take all new medication as prescribed - the cream  Please continue all other medications as before, and refills have been done if requested.  Please have the pharmacy call with any other refills you may need.  Please keep your appointments with your specialists as you may have planned  You will be contacted regarding the referral for: Lower extremity artery testing, and the Low Dose CT scan Chest for lung cancer screening  Please return in 6 months, or sooner if needed, with Lab testing done 3-5 days before

## 2014-08-02 NOTE — Assessment & Plan Note (Signed)
For low dose CT chest for lung cancer screening

## 2014-08-02 NOTE — Assessment & Plan Note (Signed)
For LE arterial dopplers,  to f/u any worsening symptoms or concerns

## 2014-08-02 NOTE — Assessment & Plan Note (Signed)
stable overall by history and exam, recent data reviewed with pt, and pt to continue medical treatment as before,  to f/u any worsening symptoms or concerns Lab Results  Component Value Date   LDLCALC 57 02/01/2014    

## 2014-08-02 NOTE — Assessment & Plan Note (Signed)
stable overall by history and exam, recent data reviewed with pt, and pt to continue medical treatment as before,  to f/u any worsening symptoms or concerns BP Readings from Last 3 Encounters:  08/02/14 116/72  05/31/14 102/78  03/22/14 124/72

## 2014-08-13 ENCOUNTER — Other Ambulatory Visit: Payer: Self-pay | Admitting: Cardiovascular Disease

## 2014-08-15 DIAGNOSIS — J449 Chronic obstructive pulmonary disease, unspecified: Secondary | ICD-10-CM | POA: Diagnosis not present

## 2014-08-28 ENCOUNTER — Ambulatory Visit (INDEPENDENT_AMBULATORY_CARE_PROVIDER_SITE_OTHER)
Admission: RE | Admit: 2014-08-28 | Discharge: 2014-08-28 | Disposition: A | Discharge status: Home / Self Care | Payer: Medicare Other | Source: Ambulatory Visit | Attending: Internal Medicine | Admitting: Internal Medicine

## 2014-08-28 DIAGNOSIS — F17210 Nicotine dependence, cigarettes, uncomplicated: Secondary | ICD-10-CM

## 2014-08-28 DIAGNOSIS — Z122 Encounter for screening for malignant neoplasm of respiratory organs: Secondary | ICD-10-CM

## 2014-08-28 DIAGNOSIS — F17200 Nicotine dependence, unspecified, uncomplicated: Secondary | ICD-10-CM

## 2014-08-28 DIAGNOSIS — Z720 Tobacco use: Secondary | ICD-10-CM

## 2014-09-03 ENCOUNTER — Encounter: Payer: Self-pay | Admitting: Acute Care

## 2014-09-04 ENCOUNTER — Other Ambulatory Visit: Payer: Self-pay | Admitting: Internal Medicine

## 2014-09-04 DIAGNOSIS — M79606 Pain in leg, unspecified: Secondary | ICD-10-CM

## 2014-09-04 NOTE — Telephone Encounter (Signed)
Order entry corrected

## 2014-09-11 ENCOUNTER — Ambulatory Visit (INDEPENDENT_AMBULATORY_CARE_PROVIDER_SITE_OTHER): Payer: Medicare Other | Admitting: Cardiovascular Disease

## 2014-09-11 ENCOUNTER — Encounter: Payer: Self-pay | Admitting: Cardiovascular Disease

## 2014-09-11 VITALS — BP 116/68 | HR 77 | Ht 66.0 in | Wt 119.4 lb

## 2014-09-11 DIAGNOSIS — I2510 Atherosclerotic heart disease of native coronary artery without angina pectoris: Secondary | ICD-10-CM

## 2014-09-11 DIAGNOSIS — E782 Mixed hyperlipidemia: Secondary | ICD-10-CM | POA: Diagnosis not present

## 2014-09-11 DIAGNOSIS — I10 Essential (primary) hypertension: Secondary | ICD-10-CM

## 2014-09-11 NOTE — Progress Notes (Signed)
Chief Complaint  Patient presents with  . Shortness of Breath     History of Present Illness: 74 yo female with history of CAD, PAD, HTN, COPD who is here today for cardiac follow up. She has a hx of CAD, s/p BMS to LAD in 8/10, DES to LAD for ISR 2011. She was admitted to [facility] 11/20-11/21/13 with left shoulder pain. This was reminiscent of her previous angina. Cardiac markers were negative. LHC 04/13/12: pLAD stent patent, pRCA 30%, mRCA 10%, EF 60%. Medical therapy was recommended. She was placed on isosorbide. Chest x-ray demonstrated questionable left upper lobe nodule. Outpatient CT chest January 2014 with no worrisome masses or nodules. She is followed in the pulmonary office for her severe COPD. Recent CT chest stable 08/28/14.   She is here today for follow up. She is doing well. Dyspnea is stable. No chest pain. No orthopnea, PND. No syncope. She wears supplemental O2 all day. She does have some lower ext edema but resolves at night. No claudication type symptoms   Primary Care Physician: Cathlean Cower   Past Medical History  Diagnosis Date  . Impaired glucose tolerance 01/07/2011  . ANXIETY 01/01/2007  . BURSITIS, RIGHT HIP 06/04/2009  . CAD (coronary artery disease)     a. BMS to LAD 2010. b. NSTEMI with DES to LAD for ISR 2011. c. Patent stent 03/2012/Imdur added.  . CHEST PAIN-PRECORDIAL 01/15/2009  . COPD 01/01/2007    a. Chronic resp failure on home O2.  Marland Kitchen DEPRESSION 01/01/2007  . GROIN PAIN 06/20/2008  . Headache(784.0) 01/01/2007  . HYPERTENSION 01/01/2007  . LOW BACK PAIN 01/01/2007  . Muscle weakness (generalized) 06/04/2009  . OSTEOARTHRITIS, HIP 07/01/2008  . OSTEOPOROSIS 01/01/2007  . SYNCOPE 01/01/2007  . TRANSIENT ISCHEMIC ATTACK, HX OF 01/01/2007  . Eczema 01/08/2011  . Rosacea 01/08/2011  . Colon polyps     H/o tubular adenoma of colon  . Hemorrhoid   . Pneumonia 1998  . Cataract   . Abnormal chest x-ray     03/2012: will need OP f/u.  Marland Kitchen TIA (transient ischemic  attack)   . Anginal pain   . Shortness of breath     Past Surgical History  Procedure Laterality Date  . Appendectomy    . Abdominal hysterectomy    . Oophorectomy      one ovary  . Sp lumbar disc surgury      [name]  . S/p bilat cataract  2010  . Coronary stent placement    . Colonoscopy  01/25/2002    tubular adenoma,hemorrhoids, hyperplastic  colon polyps  . Colonoscopy  02/17/2005    hemorrhoids  . Coronary angioplasty    . Back surgery    . Hip surgery Left     DR XU     PROXIMAL NECK   . Hip arthroplasty Left 10/01/2013    Procedure: ARTHROPLASTY UNIPOLAR   HIP;  Surgeon: Marianna Payment, MD;  Location: [place];  Service: Orthopedics;  Laterality: Left;  . Breast enhancement surgery    . Left heart catheterization with coronary angiogram N/A 04/13/2012    Procedure: LEFT HEART CATHETERIZATION WITH CORONARY ANGIOGRAM;  Surgeon: Burnell Blanks, MD;  Location: [facility] CATH LAB;  Service: Cardiovascular;  Laterality: N/A;    Current Outpatient Prescriptions  Medication Sig Dispense Refill  . ascorbic acid (VITAMIN C) 1000 MG tablet Take 1,000 mg by mouth daily.    . Cholecalciferol (VITAMIN D) 1000 UNITS capsule Take 1,000 Units by mouth  every evening.     . clopidogrel (PLAVIX) 75 MG tablet Take 1 tablet (75 mg total) by mouth daily with breakfast. 14 tablet 0  . Cyanocobalamin (VITAMIN B12 PO) Take 1,000 mg by mouth daily.    Marland Kitchen docusate sodium (COLACE) 100 MG capsule Take 100 mg by mouth daily as needed. For constipation    . isosorbide mononitrate (IMDUR) 30 MG 24 hr tablet TAKE 0.5 TABLETS (15 MG TOTAL) BY MOUTH DAILY. 15 tablet 6  . levalbuterol (XOPENEX HFA) 45 MCG/ACT inhaler Inhale 2 puffs into the lungs every 6 (six) hours as needed for wheezing. 1 Inhaler 12  . lovastatin (MEVACOR) 20 MG tablet TAKE 1 TABLET BY MOUTH AT BEDTIME 90 tablet 3  . metoprolol tartrate (LOPRESSOR) 25 MG tablet TAKE 1/2 TABLET BY MOUTH 2 TIMES A DAY 30 tablet 5  . nitroGLYCERIN  (NITROSTAT) 0.4 MG SL tablet Place 1 tablet (0.4 mg total) under the tongue every 5 (five) minutes as needed for chest pain (up to 3 doses). 25 tablet 4  . nortriptyline (PAMELOR) 10 MG capsule Take 10 mg by mouth at bedtime.     . ondansetron (ZOFRAN) 4 MG tablet Take 4 mg by mouth 3 (three) times daily as needed. For nausea (take with tramadol)    . polyvinyl alcohol (LIQUIFILM TEARS) 1.4 % ophthalmic solution Place 1 drop into both eyes daily.    Marland Kitchen tiotropium (SPIRIVA) 18 MCG inhalation capsule Place 18 mcg into inhaler and inhale daily.    . traMADol (ULTRAM) 50 MG tablet Take 100 mg by mouth 3 (three) times daily as needed. For pain    . triamcinolone (KENALOG) 0.025 % cream Apply 1 application topically 2 (two) times daily. 30 g 1   No current facility-administered medications for this visit.    Allergies  Allergen Reactions  . Aspirin Other (See Comments)     cns bleed risk  . Other     Stiloto - severe reaction - caused inability to breath  . Codeine Rash    History   Social History  . Marital Status: Married    Spouse Name: N/A  . Number of Children: 4  . Years of Education: N/A   Occupational History  . disabled back since 2003    Social History Main Topics  . Smoking status: Former Smoker -- 1.50 packs/day for 51 years    Types: Cigarettes    Quit date: 07/24/2005  . Smokeless tobacco: Never Used  . Alcohol Use: No  . Drug Use: No  . Sexual Activity: Not on file   Other Topics Concern  . Not on file   Social History Narrative    Family History  Problem Relation Age of Onset  . Osteoporosis Mother   . Heart disease Sister   . Emphysema Sister   . Seizures Sister     epilepsy  . Cardiomyopathy Sister   . Heart attack Sister     Review of Systems:  As stated in the HPI and otherwise negative.   BP 116/68 mmHg  Pulse 77  Ht 5\' 6"  (1.676 m)  Wt 119 lb 6.4 oz (54.159 kg)  BMI 19.28 kg/m2  Physical Examination: General: Well developed, well  nourished, NAD HEENT: OP clear, mucus membranes moist SKIN: warm, dry. No rashes. Neuro: No focal deficits Musculoskeletal: Muscle strength 5/5 all ext Psychiatric: Mood and affect normal Neck: No JVD, no carotid bruits, no thyromegaly, no lymphadenopathy. Lungs:Clear bilaterally, no wheezes, rhonci, crackles Cardiovascular: Regular rate and rhythm.  No murmurs, gallops or rubs. Abdomen:Soft. Bowel sounds present. Non-tender.  Extremities: No lower extremity edema. Pulses are 2 + in the bilateral DP/PT.  Chest CT: 06/01/12:  1. No suspicious pulmonary nodule in the left upper lobe to correspond to the plain film abnormality which likely represented a summation of shadows. 2. Stable small pulmonary nodules in the right middle lobe. 3. Central lobular emphysema again demonstrated. 4. Coronary calcifications.  EKG:  EKG is ordered today. The ekg ordered today demonstrates NSR, rate 77 bpm. Poor R wave progression precordial leads.   Recent Labs: 02/01/2014: ALT 7; BUN 7; Creatinine 0.9; Hemoglobin 11.9*; Platelets 320.0; Potassium 4.3; Sodium 137; TSH 1.12   Lipid Panel    Component Value Date/Time   CHOL 143 02/01/2014 1418   TRIG 159.0* 02/01/2014 1418   HDL 54.20 02/01/2014 1418   CHOLHDL 3 02/01/2014 1418   VLDL 31.8 02/01/2014 1418   LDLCALC 57 02/01/2014 1418   LDLDIRECT 63.8 07/18/2012 1418     Wt Readings from Last 3 Encounters:  09/11/14 119 lb 6.4 oz (54.159 kg)  08/02/14 117 lb 1.3 oz (53.107 kg)  05/31/14 117 lb 3.2 oz (53.162 kg)     Other studies Reviewed: Additional studies/ records that were reviewed today include: . Review of the above records demonstrates:    Assessment and Plan:   1. Coronary Artery Disease: Stable. No further angina. Tolerating Imdur. Continue current Rx with ASA, Plavix, beta blocker, statin and nitrates.   2. Hyperlipidemia: Controlled. Continue statin.   3. Hypertension: Controlled. Continue current therapy.   4. Lung Nodule:  Chest CT without any worrisome masses.   Current medicines are reviewed at length with the patient today.  The patient does not have concerns regarding medicines.  The following changes have been made:  no change  Labs/ tests ordered today include:   Orders Placed This Encounter  Procedures  . EKG 12-Lead    Disposition:   FU with me in 12  months  Signed, Lauree Chandler, MD 09/11/2014 12:19 PM    [facility] [address], [place], [place]  [address] Phone: [phone]; Fax: [phone]

## 2014-09-11 NOTE — Patient Instructions (Signed)
Medication Instructions:  Your physician recommends that you continue on your current medications as directed. Please refer to the Current Medication list given to you today.   Labwork: none  Testing/Procedures: none  Follow-Up: Your physician wants you to follow-up in:  12 months.  You will receive a reminder letter in the mail two months in advance. If you don't receive a letter, please call our office to schedule the follow-up appointment.        

## 2014-09-13 ENCOUNTER — Other Ambulatory Visit (HOSPITAL_COMMUNITY): Payer: Self-pay | Admitting: *Deleted

## 2014-09-13 ENCOUNTER — Ambulatory Visit (HOSPITAL_COMMUNITY): Payer: Medicare Other | Attending: Internal Medicine | Admitting: *Deleted

## 2014-09-13 DIAGNOSIS — I10 Essential (primary) hypertension: Secondary | ICD-10-CM | POA: Diagnosis not present

## 2014-09-13 DIAGNOSIS — Z8673 Personal history of transient ischemic attack (TIA), and cerebral infarction without residual deficits: Secondary | ICD-10-CM | POA: Diagnosis not present

## 2014-09-13 DIAGNOSIS — M79606 Pain in leg, unspecified: Secondary | ICD-10-CM | POA: Diagnosis not present

## 2014-09-13 DIAGNOSIS — R209 Unspecified disturbances of skin sensation: Secondary | ICD-10-CM | POA: Diagnosis not present

## 2014-09-13 DIAGNOSIS — I739 Peripheral vascular disease, unspecified: Secondary | ICD-10-CM

## 2014-09-13 DIAGNOSIS — R200 Anesthesia of skin: Secondary | ICD-10-CM | POA: Insufficient documentation

## 2014-09-13 DIAGNOSIS — I2510 Atherosclerotic heart disease of native coronary artery without angina pectoris: Secondary | ICD-10-CM | POA: Insufficient documentation

## 2014-09-13 DIAGNOSIS — Z87891 Personal history of nicotine dependence: Secondary | ICD-10-CM | POA: Diagnosis not present

## 2014-09-13 DIAGNOSIS — E785 Hyperlipidemia, unspecified: Secondary | ICD-10-CM | POA: Insufficient documentation

## 2014-09-13 DIAGNOSIS — J449 Chronic obstructive pulmonary disease, unspecified: Secondary | ICD-10-CM | POA: Insufficient documentation

## 2014-09-13 NOTE — Progress Notes (Signed)
Lower Extremity Arterial Doppler Complete Performed

## 2014-09-15 DIAGNOSIS — J449 Chronic obstructive pulmonary disease, unspecified: Secondary | ICD-10-CM | POA: Diagnosis not present

## 2014-09-17 ENCOUNTER — Other Ambulatory Visit: Payer: Self-pay | Admitting: Cardiovascular Disease

## 2014-09-20 ENCOUNTER — Other Ambulatory Visit: Payer: Self-pay | Admitting: Internal Medicine

## 2014-09-24 ENCOUNTER — Telehealth: Payer: Self-pay | Admitting: Internal Medicine

## 2014-09-24 MED ORDER — TIOTROPIUM BROMIDE MONOHYDRATE 18 MCG IN CAPS: ORAL_CAPSULE | RESPIRATORY_TRACT | Status: DC

## 2014-09-24 NOTE — Telephone Encounter (Signed)
[facility].../lmb

## 2014-09-24 NOTE — Telephone Encounter (Signed)
[facility] not [facility].  Would like for script to be resent.

## 2014-10-04 ENCOUNTER — Ambulatory Visit (INDEPENDENT_AMBULATORY_CARE_PROVIDER_SITE_OTHER): Payer: Medicare Other | Admitting: Pulmonary Disease

## 2014-10-04 ENCOUNTER — Encounter: Payer: Self-pay | Admitting: Pulmonary Disease

## 2014-10-04 VITALS — BP 110/60 | HR 76 | Temp 98.1°F | Ht 65.0 in | Wt 119.0 lb

## 2014-10-04 DIAGNOSIS — J9611 Chronic respiratory failure with hypoxia: Secondary | ICD-10-CM | POA: Diagnosis not present

## 2014-10-04 DIAGNOSIS — J438 Other emphysema: Secondary | ICD-10-CM

## 2014-10-04 DIAGNOSIS — M722 Plantar fascial fibromatosis: Secondary | ICD-10-CM | POA: Diagnosis not present

## 2014-10-04 NOTE — Patient Instructions (Signed)
Stay on spiriva Will start you on breo 100, one inhalation each am.  Rinse mouth out well after using.  If you feel it helps you after 4 weeks, please call and we can send in prescription. Will send an order to [facility] to evaluate you for a portable concentrator. followup with Dr. Lamonte Sakai in 4 mos

## 2014-10-04 NOTE — Progress Notes (Signed)
   Subjective:    Patient ID: Alexis Higgins, female    DOB: [birth year], 74 y.o.   MRN: [id number]  HPI Patient comes in today for follow-up of her known severe COPD with chronic respiratory failure. She is maintaining on Spiriva alone, and is been hesitant to try a LABA/ICS. She has tried things in the past that were not tolerated or did not help her. She has not had a recent acute exacerbation, and denies any significant chest congestion or purulence. She is interested in trying a lighter portable oxygen source.   Review of Systems  Constitutional: Negative for fever and unexpected weight change.  HENT: Negative for congestion, dental problem, ear pain, nosebleeds, postnasal drip, rhinorrhea, sinus pressure, sneezing, sore throat and trouble swallowing.   Eyes: Negative for redness and itching.  Respiratory: Positive for shortness of breath. Negative for cough, chest tightness and wheezing.   Cardiovascular: Negative for palpitations and leg swelling.  Gastrointestinal: Negative for nausea and vomiting.  Genitourinary: Negative for dysuria.  Musculoskeletal: Negative for joint swelling.  Skin: Negative for rash.  Neurological: Negative for headaches.  Hematological: Does not bruise/bleed easily.  Psychiatric/Behavioral: Negative for dysphoric mood. The patient is not nervous/anxious.        Objective:   Physical Exam Well-developed female in no acute distress Nose without purulence or discharge noted Neck without lymphadenopathy or thyromegaly Chest with decreased breath sounds diffusely, no wheezes or crackles Cardiac exam with regular rate and rhythm Lower extremities with minimal edema, no cyanosis Alert and oriented, moves all 4 extremities.       Assessment & Plan:

## 2014-10-04 NOTE — Assessment & Plan Note (Signed)
The patient has very severe COPD, and currently is not on a LABA/ICS because of a history of intolerance in the past. Since she is having worsening dyspnea on exertion, I would like to add Breo to her regimen and see if she has improvement. She is willing to give this a try. I have asked her to continue on Spiriva and her oxygen, and to try and stay as active as possible.

## 2014-10-10 ENCOUNTER — Telehealth: Payer: Self-pay | Admitting: Pulmonary Disease

## 2014-10-10 DIAGNOSIS — J438 Other emphysema: Secondary | ICD-10-CM

## 2014-10-10 NOTE — Telephone Encounter (Signed)
Order placed. Nothing further needed. 

## 2014-10-14 ENCOUNTER — Other Ambulatory Visit: Payer: Self-pay | Admitting: Cardiovascular Disease

## 2014-10-15 DIAGNOSIS — M961 Postlaminectomy syndrome, not elsewhere classified: Secondary | ICD-10-CM | POA: Diagnosis not present

## 2014-10-15 DIAGNOSIS — G894 Chronic pain syndrome: Secondary | ICD-10-CM | POA: Diagnosis not present

## 2014-10-15 DIAGNOSIS — M47817 Spondylosis without myelopathy or radiculopathy, lumbosacral region: Secondary | ICD-10-CM | POA: Diagnosis not present

## 2014-10-15 DIAGNOSIS — G4700 Insomnia, unspecified: Secondary | ICD-10-CM | POA: Diagnosis not present

## 2014-10-15 DIAGNOSIS — J449 Chronic obstructive pulmonary disease, unspecified: Secondary | ICD-10-CM | POA: Diagnosis not present

## 2014-10-17 ENCOUNTER — Other Ambulatory Visit: Payer: Self-pay

## 2014-10-17 MED ORDER — ISOSORBIDE MONONITRATE ER 30 MG PO TB24: ORAL_TABLET | ORAL | Status: DC

## 2014-10-18 DIAGNOSIS — J449 Chronic obstructive pulmonary disease, unspecified: Secondary | ICD-10-CM | POA: Diagnosis not present

## 2014-10-22 ENCOUNTER — Other Ambulatory Visit: Payer: Self-pay

## 2014-10-22 MED ORDER — LEVALBUTEROL TARTRATE 45 MCG/ACT IN AERO: 2.0000 | INHALATION_SPRAY | Freq: Four times a day (QID) | RESPIRATORY_TRACT | Status: DC | PRN

## 2014-10-22 MED ORDER — LOVASTATIN 20 MG PO TABS: 20.0000 mg | ORAL_TABLET | Freq: Every day | ORAL | Status: DC

## 2014-10-23 ENCOUNTER — Other Ambulatory Visit: Payer: Self-pay | Admitting: *Deleted

## 2014-10-23 MED ORDER — ISOSORBIDE MONONITRATE ER 30 MG PO TB24: ORAL_TABLET | ORAL | Status: DC

## 2014-11-13 ENCOUNTER — Encounter: Payer: Self-pay | Admitting: Internal Medicine

## 2014-11-14 MED ORDER — MOMETASONE FUROATE 0.1 % EX CREA: 1.0000 "application " | TOPICAL_CREAM | Freq: Every day | CUTANEOUS | Status: DC

## 2014-11-15 DIAGNOSIS — J449 Chronic obstructive pulmonary disease, unspecified: Secondary | ICD-10-CM | POA: Diagnosis not present

## 2014-11-18 ENCOUNTER — Telehealth: Payer: Self-pay | Admitting: Pulmonary Disease

## 2014-11-18 DIAGNOSIS — J449 Chronic obstructive pulmonary disease, unspecified: Secondary | ICD-10-CM | POA: Diagnosis not present

## 2014-11-18 MED ORDER — FLUTICASONE FUROATE-VILANTEROL 100-25 MCG/INH IN AEPB: INHALATION_SPRAY | RESPIRATORY_TRACT | Status: DC

## 2014-11-18 NOTE — Telephone Encounter (Signed)
Per 10/04/14 OV: Patient Instructions       Stay on spiriva Will start you on breo 100, one inhalation each am.  Rinse mouth out well after using.  If you feel it helps you after 4 weeks, please call and we can send in prescription. Will send an order to [facility] to evaluate you for a portable concentrator. followup with Dr. Lamonte Sakai in 4 mos  --   Spoke with pt. Aware Memory Dance has been sent in. Nothing further needed

## 2014-11-19 ENCOUNTER — Telehealth: Payer: Self-pay | Admitting: Emergency Medicine

## 2014-11-19 ENCOUNTER — Telehealth: Payer: Self-pay | Admitting: Internal Medicine

## 2014-11-19 MED ORDER — FLUTICASONE FUROATE-VILANTEROL 100-25 MCG/INH IN AEPB
INHALATION_SPRAY | RESPIRATORY_TRACT | Status: DC
Start: 2014-11-19 — End: 2015-02-21

## 2014-11-19 MED ORDER — MOMETASONE FUROATE 0.1 % EX CREA: 1.0000 "application " | TOPICAL_CREAM | Freq: Every day | CUTANEOUS | Status: DC

## 2014-11-19 NOTE — Telephone Encounter (Signed)
[phone], pt cb

## 2014-11-19 NOTE — Telephone Encounter (Signed)
Called spoke with spouse. Aware refill for breo sent to [facility]. Nothing further needed

## 2014-11-19 NOTE — Telephone Encounter (Signed)
Patient would like for all her medication Tri[facility]one sent to [facility] on [address], she would like for all of her other medications sent there as well, from now on. Patient husband stated that mometasone 0.1% cream is to expensive.

## 2014-11-19 NOTE — Telephone Encounter (Signed)
Patient would like for all her medication Triamcinolone 1 % sent to [facility] on [address], she would like for all of her other medications sent there as well, from now on.

## 2014-11-19 NOTE — Telephone Encounter (Signed)
Rx done. 

## 2014-11-19 NOTE — Telephone Encounter (Signed)
ATC will call back °

## 2014-11-20 MED ORDER — TRIAMCINOLONE ACETONIDE 0.1 % EX CREA: 1.0000 "application " | TOPICAL_CREAM | Freq: Two times a day (BID) | CUTANEOUS | Status: DC

## 2014-11-20 NOTE — Telephone Encounter (Signed)
rx for triam cr done erx

## 2014-11-20 NOTE — Addendum Note (Signed)
Addended by: Biagio Borg on: 11/20/2014 01:15 PM   Modules accepted: Orders, Medications

## 2014-11-20 NOTE — Telephone Encounter (Signed)
error 

## 2014-11-20 NOTE — Telephone Encounter (Signed)
Spouse called back and states that the cream sent in was too expensive.  Is requesting triamcinolone acetonide  1.0 to be sent in.  Would like call back in regards.

## 2014-12-15 DIAGNOSIS — J449 Chronic obstructive pulmonary disease, unspecified: Secondary | ICD-10-CM | POA: Diagnosis not present

## 2014-12-18 DIAGNOSIS — J449 Chronic obstructive pulmonary disease, unspecified: Secondary | ICD-10-CM | POA: Diagnosis not present

## 2015-01-10 DIAGNOSIS — Z0279 Encounter for issue of other medical certificate: Secondary | ICD-10-CM

## 2015-01-15 DIAGNOSIS — J449 Chronic obstructive pulmonary disease, unspecified: Secondary | ICD-10-CM | POA: Diagnosis not present

## 2015-01-17 DIAGNOSIS — M47817 Spondylosis without myelopathy or radiculopathy, lumbosacral region: Secondary | ICD-10-CM | POA: Diagnosis not present

## 2015-01-17 DIAGNOSIS — G4700 Insomnia, unspecified: Secondary | ICD-10-CM | POA: Diagnosis not present

## 2015-01-17 DIAGNOSIS — M961 Postlaminectomy syndrome, not elsewhere classified: Secondary | ICD-10-CM | POA: Diagnosis not present

## 2015-01-17 DIAGNOSIS — G894 Chronic pain syndrome: Secondary | ICD-10-CM | POA: Diagnosis not present

## 2015-01-17 DIAGNOSIS — Z79891 Long term (current) use of opiate analgesic: Secondary | ICD-10-CM | POA: Diagnosis not present

## 2015-01-18 DIAGNOSIS — J449 Chronic obstructive pulmonary disease, unspecified: Secondary | ICD-10-CM | POA: Diagnosis not present

## 2015-01-31 ENCOUNTER — Ambulatory Visit: Payer: Medicare Other | Admitting: Internal Medicine

## 2015-02-14 ENCOUNTER — Ambulatory Visit: Payer: Medicare Other | Admitting: Internal Medicine

## 2015-02-14 ENCOUNTER — Other Ambulatory Visit (INDEPENDENT_AMBULATORY_CARE_PROVIDER_SITE_OTHER): Payer: Medicare Other

## 2015-02-14 DIAGNOSIS — Z0189 Encounter for other specified special examinations: Secondary | ICD-10-CM

## 2015-02-14 DIAGNOSIS — Z0000 Encounter for general adult medical examination without abnormal findings: Secondary | ICD-10-CM

## 2015-02-14 DIAGNOSIS — E785 Hyperlipidemia, unspecified: Secondary | ICD-10-CM

## 2015-02-14 DIAGNOSIS — R7989 Other specified abnormal findings of blood chemistry: Secondary | ICD-10-CM

## 2015-02-14 DIAGNOSIS — I10 Essential (primary) hypertension: Secondary | ICD-10-CM | POA: Diagnosis not present

## 2015-02-14 LAB — URINALYSIS, ROUTINE W REFLEX MICROSCOPIC
BILIRUBIN URINE: NEGATIVE
HGB URINE DIPSTICK: NEGATIVE
Ketones, ur: NEGATIVE
Leukocytes, UA: NEGATIVE
Nitrite: NEGATIVE
Specific Gravity, Urine: <=1.005 — AB (ref 1.000–1.030)
TOTAL PROTEIN, URINE-UPE24: NEGATIVE
URINE GLUCOSE: NEGATIVE
UROBILINOGEN UA: 0.2 (ref 0.0–1.0)
pH: 6.5 (ref 5.0–8.0)

## 2015-02-14 LAB — HEPATIC FUNCTION PANEL
ALT: 6 U/L (ref 0–35)
AST: 14 U/L (ref 0–37)
Albumin: 3.7 g/dL (ref 3.5–5.2)
Alkaline Phosphatase: 69 U/L (ref 39–117)
BILIRUBIN DIRECT: 0.1 mg/dL (ref 0.0–0.3)
TOTAL PROTEIN: 6.4 g/dL (ref 6.0–8.3)
Total Bilirubin: 0.4 mg/dL (ref 0.2–1.2)

## 2015-02-14 LAB — LIPID PANEL
CHOL/HDL RATIO: 3
Cholesterol: 159 mg/dL (ref 0–200)
HDL: 53.5 mg/dL (ref >39.00)
NONHDL: 105.62
Triglycerides: 251 mg/dL — ABNORMAL HIGH (ref 0.0–149.0)
VLDL: 50.2 mg/dL — AB (ref 0.0–40.0)

## 2015-02-14 LAB — LDL CHOLESTEROL, DIRECT: Direct LDL: 67 mg/dL

## 2015-02-14 LAB — CBC WITH DIFFERENTIAL/PLATELET
BASOS ABS: 0 10*3/uL (ref 0.0–0.1)
BASOS PCT: 0.5 % (ref 0.0–3.0)
Eosinophils Absolute: 0.7 10*3/uL (ref 0.0–0.7)
Eosinophils Relative: 8.5 % — ABNORMAL HIGH (ref 0.0–5.0)
HCT: 40.5 % (ref 36.0–46.0)
Hemoglobin: 13.5 g/dL (ref 12.0–15.0)
LYMPHS PCT: 30 % (ref 12.0–46.0)
Lymphs Abs: 2.5 10*3/uL (ref 0.7–4.0)
MCHC: 33.4 g/dL (ref 30.0–36.0)
MCV: 85 fl (ref 78.0–100.0)
Monocytes Absolute: 0.6 10*3/uL (ref 0.1–1.0)
Monocytes Relative: 7.1 % (ref 3.0–12.0)
Neutro Abs: 4.5 10*3/uL (ref 1.4–7.7)
Neutrophils Relative %: 53.9 % (ref 43.0–77.0)
Platelets: 256 10*3/uL (ref 150.0–400.0)
RBC: 4.76 Mil/uL (ref 3.87–5.11)
RDW: 13.3 % (ref 11.5–15.5)
WBC: 8.3 10*3/uL (ref 4.0–10.5)

## 2015-02-14 LAB — BASIC METABOLIC PANEL
BUN: 5 mg/dL — ABNORMAL LOW (ref 6–23)
CO2: 33 mEq/L — ABNORMAL HIGH (ref 19–32)
CREATININE: 0.78 mg/dL (ref 0.40–1.20)
Calcium: 9.1 mg/dL (ref 8.4–10.5)
Chloride: 102 mEq/L (ref 96–112)
GFR: 76.83 mL/min (ref >60.00)
Glucose, Bld: 97 mg/dL (ref 70–99)
Potassium: 4.1 mEq/L (ref 3.5–5.1)
Sodium: 142 mEq/L (ref 135–145)

## 2015-02-14 LAB — TSH: TSH: 1.86 u[IU]/mL (ref 0.35–4.50)

## 2015-02-15 DIAGNOSIS — J449 Chronic obstructive pulmonary disease, unspecified: Secondary | ICD-10-CM | POA: Diagnosis not present

## 2015-02-18 ENCOUNTER — Encounter: Payer: Self-pay | Admitting: Internal Medicine

## 2015-02-18 ENCOUNTER — Ambulatory Visit (INDEPENDENT_AMBULATORY_CARE_PROVIDER_SITE_OTHER): Payer: Medicare Other | Admitting: Internal Medicine

## 2015-02-18 VITALS — BP 106/60 | HR 76 | Temp 98.4°F | Ht 65.0 in | Wt 125.0 lb

## 2015-02-18 DIAGNOSIS — J449 Chronic obstructive pulmonary disease, unspecified: Secondary | ICD-10-CM | POA: Diagnosis not present

## 2015-02-18 DIAGNOSIS — J438 Other emphysema: Secondary | ICD-10-CM

## 2015-02-18 DIAGNOSIS — Z0000 Encounter for general adult medical examination without abnormal findings: Secondary | ICD-10-CM

## 2015-02-18 DIAGNOSIS — Z0001 Encounter for general adult medical examination with abnormal findings: Secondary | ICD-10-CM

## 2015-02-18 DIAGNOSIS — Z23 Encounter for immunization: Secondary | ICD-10-CM

## 2015-02-18 MED ORDER — METHYLPREDNISOLONE ACETATE 80 MG/ML IJ SUSP
80.0000 mg | Freq: Once | INTRAMUSCULAR | Status: AC
  Administered 2015-02-18: 80 mg via INTRAMUSCULAR

## 2015-02-18 NOTE — Progress Notes (Signed)
Pre visit review using our clinic review tool, if applicable. No additional management support is needed unless otherwise documented below in the visit note. 

## 2015-02-18 NOTE — Progress Notes (Signed)
Subjective:    Patient ID: Alexis Higgins, female    DOB: [birth year], 74 y.o.   MRN: [id number]  HPI  Here for wellness and f/u;  Overall doing ok;  Pt denies Chest pain, though may be mildly more 1 wk wheezing/sob/doe without prod cough, orthopnea, PND, worsening LE edema, palpitations, dizziness or syncope.  Pt denies neurological change such as new headache, facial or extremity weakness.  Pt denies polydipsia, polyuria, or low sugar symptoms. Pt states overall good compliance with treatment and medications, good tolerability, and has been trying to follow appropriate diet.  Pt denies worsening depressive symptoms, suicidal ideation or panic. No fever, night sweats, wt loss, loss of appetite, or other constitutional symptoms.  Pt states good ability with ADL's, has low fall risk, home safety reviewed and adequate, no other significant changes in hearing or vision, and only occasionally active with exercise.  Has to sit with walking room to room only in the house with portable o2 - 2L [place] Past Medical History  Diagnosis Date  . Impaired glucose tolerance 01/07/2011  . ANXIETY 01/01/2007  . BURSITIS, RIGHT HIP 06/04/2009  . CAD (coronary artery disease)     a. BMS to LAD 2010. b. NSTEMI with DES to LAD for ISR 2011. c. Patent stent 03/2012/Imdur added.  . CHEST PAIN-PRECORDIAL 01/15/2009  . COPD 01/01/2007    a. Chronic resp failure on home O2.  Marland Kitchen DEPRESSION 01/01/2007  . GROIN PAIN 06/20/2008  . Headache(784.0) 01/01/2007  . HYPERTENSION 01/01/2007  . LOW BACK PAIN 01/01/2007  . Muscle weakness (generalized) 06/04/2009  . OSTEOARTHRITIS, HIP 07/01/2008  . OSTEOPOROSIS 01/01/2007  . SYNCOPE 01/01/2007  . TRANSIENT ISCHEMIC ATTACK, HX OF 01/01/2007  . Eczema 01/08/2011  . Rosacea 01/08/2011  . Colon polyps     H/o tubular adenoma of colon  . Hemorrhoid   . Pneumonia 1998  . Cataract   . Abnormal chest x-ray     03/2012: will need OP f/u.  Marland Kitchen TIA (transient ischemic attack)   . Anginal pain   .  Shortness of breath    Past Surgical History  Procedure Laterality Date  . Appendectomy    . Abdominal hysterectomy    . Oophorectomy      one ovary  . Sp lumbar disc surgury      [name]  . S/p bilat cataract  2010  . Coronary stent placement    . Colonoscopy  01/25/2002    tubular adenoma,hemorrhoids, hyperplastic  colon polyps  . Colonoscopy  02/17/2005    hemorrhoids  . Coronary angioplasty    . Back surgery    . Hip surgery Left     DR XU     PROXIMAL NECK   . Hip arthroplasty Left 10/01/2013    Procedure: ARTHROPLASTY UNIPOLAR   HIP;  Surgeon: Marianna Payment, MD;  Location: [facility];  Service: Orthopedics;  Laterality: Left;  . Breast enhancement surgery    . Left heart catheterization with coronary angiogram N/A 04/13/2012    Procedure: LEFT HEART CATHETERIZATION WITH CORONARY ANGIOGRAM;  Surgeon: Burnell Blanks, MD;  Location: [facility] CATH LAB;  Service: Cardiovascular;  Laterality: N/A;    reports that she quit smoking about 9 years ago. Her smoking use included Cigarettes. She has a 76.5 pack-year smoking history. She has never used smokeless tobacco. She reports that she does not drink alcohol or use illicit drugs. family history includes Cardiomyopathy in her sister; Emphysema in her sister; Heart attack in her  sister; Heart disease in her sister; Osteoporosis in her mother; Seizures in her sister. Allergies  Allergen Reactions  . Aspirin Other (See Comments)     cns bleed risk  . Other     Stiloto - severe reaction - caused inability to breath  . Codeine Rash   Current Outpatient Prescriptions on File Prior to Visit  Medication Sig Dispense Refill  . ascorbic acid (VITAMIN C) 1000 MG tablet Take 1,000 mg by mouth daily.    . Cholecalciferol (VITAMIN D) 1000 UNITS capsule Take 1,000 Units by mouth every evening.     . clopidogrel (PLAVIX) 75 MG tablet TAKE 1 TABLET (75 MG TOTAL) BY MOUTH DAILY. 30 tablet 11  . Cyanocobalamin (VITAMIN B12 PO) Take 1,000 mg  by mouth daily.    Marland Kitchen docusate sodium (COLACE) 100 MG capsule Take 100 mg by mouth daily as needed. For constipation    . isosorbide mononitrate (IMDUR) 30 MG 24 hr tablet TAKE 0.5 TABLETS (15 MG TOTAL) BY MOUTH DAILY. 45 tablet 3  . levalbuterol (XOPENEX HFA) 45 MCG/ACT inhaler Inhale 2 puffs into the lungs every 6 (six) hours as needed for wheezing. 3 Inhaler 3  . lovastatin (MEVACOR) 20 MG tablet Take 1 tablet (20 mg total) by mouth at bedtime. 90 tablet 1  . metoprolol tartrate (LOPRESSOR) 25 MG tablet TAKE 1/2 TABLET BY MOUTH 2 TIMES A DAY 30 tablet 11  . nitroGLYCERIN (NITROSTAT) 0.4 MG SL tablet Place 1 tablet (0.4 mg total) under the tongue every 5 (five) minutes as needed for chest pain (up to 3 doses). 25 tablet 4  . nortriptyline (PAMELOR) 10 MG capsule Take 10 mg by mouth at bedtime.     . ondansetron (ZOFRAN) 4 MG tablet Take 4 mg by mouth 3 (three) times daily as needed. For nausea (take with tramadol)    . polyvinyl alcohol (LIQUIFILM TEARS) 1.4 % ophthalmic solution Place 1 drop into both eyes daily.    Marland Kitchen tiotropium (SPIRIVA) 18 MCG inhalation capsule Place 18 mcg into inhaler and inhale daily.    . traMADol (ULTRAM) 50 MG tablet Take 100 mg by mouth 3 (three) times daily as needed. For pain    . triamcinolone cream (KENALOG) 0.1 % Apply 1 application topically 2 (two) times daily. 30 g 0  . Fluticasone Furoate-Vilanterol 100-25 MCG/INH AEPB 1 puff every morning (Patient not taking: Reported on 02/18/2015) 60 each 4   No current facility-administered medications on file prior to visit.   Review of Systems Constitutional: Negative for increased diaphoresis, other activity, appetite or siginficant weight change other than noted HENT: Negative for worsening hearing loss, ear pain, facial swelling, mouth sores and neck stiffness.   Eyes: Negative for other worsening pain, redness or visual disturbance.  Respiratory: Negative for shortness of breath and wheezing  Cardiovascular:  Negative for chest pain and palpitations.  Gastrointestinal: Negative for diarrhea, blood in stool, abdominal distention or other pain Genitourinary: Negative for hematuria, flank pain or change in urine volume.  Musculoskeletal: Negative for myalgias or other joint complaints.  Skin: Negative for color change and wound or drainage.  Neurological: Negative for syncope and numbness. other than noted Hematological: Negative for adenopathy. or other swelling Psychiatric/Behavioral: Negative for hallucinations, SI, self-injury, decreased concentration or other worsening agitation.      Objective:   Physical Exam BP 106/60 mmHg  Pulse 76  Temp(Src) 98.4 F (36.9 C) (Oral)  Ht 5\' 5"  (1.651 m)  Wt 125 lb (56.7 kg)  BMI 20.80  kg/m2  SpO2 96% VS noted,  Constitutional: Pt is oriented to person, place, and time. Appears well-developed and well-nourished, in no significant distress Head: Normocephalic and atraumatic.  Right Ear: External ear normal.  Left Ear: External ear normal.  Nose: Nose normal.  Mouth/Throat: Oropharynx is clear and moist.  Eyes: Conjunctivae and EOM are normal. Pupils are equal, round, and reactive to light.  Neck: Normal range of motion. Neck supple. No JVD present. No tracheal deviation present or significant neck LA or mass Cardiovascular: Normal rate, regular rhythm, normal heart sounds and intact distal pulses.   Pulmonary/Chest: Effort normal and breath sounds decreased bilat Abdominal: Soft. Bowel sounds are normal. NT. No HSM  Musculoskeletal: Normal range of motion. Exhibits no edema.  Lymphadenopathy:  Has no cervical adenopathy.  Neurological: Pt is alert and oriented to person, place, and time. Pt has normal reflexes. No cranial nerve deficit. Motor grossly intact Skin: Skin is warm and dry. No rash noted.  Psychiatric:  Has normal mood and affect. Behavior is normal.     Assessment & Plan:

## 2015-02-18 NOTE — Assessment & Plan Note (Addendum)
Mild worsening?, for depomedrol IM today x,  to f/u any worsening symptoms or concerns, has f/u with pulm next wk SpO2 Readings from Last 3 Encounters:  02/18/15 96%  10/04/14 98%  08/02/14 95%   Ambulatory o2 sat on 2L [place] - start 96%, then 92% at 100 ft, cont 2L [place] continous

## 2015-02-18 NOTE — Patient Instructions (Signed)
You had the steroid shot today  Please continue all other medications as before, and refills have been done if requested.  Please have the pharmacy call with any other refills you may need.  Please continue your efforts at being more active, low cholesterol diet, and weight control.  You are otherwise up to date with prevention measures today.  Please keep your appointments with your specialists as you may have planned  Please return in 6 months, or sooner if needed

## 2015-02-18 NOTE — Assessment & Plan Note (Signed)

## 2015-02-18 NOTE — Addendum Note (Signed)
Addended by: Lyman Bishop on: 02/18/2015 06:35 PM   Modules accepted: Orders

## 2015-02-21 ENCOUNTER — Ambulatory Visit (INDEPENDENT_AMBULATORY_CARE_PROVIDER_SITE_OTHER): Payer: Medicare Other | Admitting: Emergency Medicine

## 2015-02-21 ENCOUNTER — Encounter: Payer: Self-pay | Admitting: Emergency Medicine

## 2015-02-21 VITALS — BP 130/68 | HR 79 | Ht 66.0 in | Wt 125.8 lb

## 2015-02-21 DIAGNOSIS — J9611 Chronic respiratory failure with hypoxia: Secondary | ICD-10-CM | POA: Diagnosis not present

## 2015-02-21 DIAGNOSIS — J438 Other emphysema: Secondary | ICD-10-CM

## 2015-02-21 MED ORDER — PREDNISONE 10 MG PO TABS: ORAL_TABLET | ORAL | Status: DC

## 2015-02-21 NOTE — Assessment & Plan Note (Signed)
Chronic hypoxemic respiratory failure. I suspect that this is largely due to her severe COPD, now with very limiting symptoms and poor exertional tolerance. Certainly also consider the fact that she has coronary artery disease if she remains dyspneic and hypoxemic even after COPD is maximally treated. She had walking oximetry this week that did not show desaturation on 2 L/m. We will continue this dose

## 2015-02-21 NOTE — Patient Instructions (Signed)
Please continue Spiriva every day as you are taking it Continue Xopenex 2 puffs as needed for shortness of breath Take prednisone taper as directed: Take 40mg  daily for 3 days, then 30mg  daily for 3 days, then 20mg  daily for 3 days, then 10mg  daily for 3 days, then stop We will consider changing Spiriva to Anoro at some point in the future.  Continue oxygen at 2 L/m at all times Follow with Rexene Edison, NP in 2-3 weeks to discuss your progress and to consider the Anoro.  Follow with Dr Lamonte Sakai in 2 months

## 2015-02-21 NOTE — Assessment & Plan Note (Signed)
Given the description of her symptoms it is not clear that she is experiencing an acute exacerbation. The pattern is consistent with a slow progressive worsening of cough and exertional dyspnea. All the same would like to treat her with a short steroid course to see if she rebounds. I'll also like to see if we can change Spiriva to Anoro to add the long-acting beta agonist component. Note she has not benefited from [place] or [employer].,

## 2015-02-21 NOTE — Progress Notes (Signed)
Subjective:    Patient ID: Alexis Higgins, female    DOB: August 31, 1940, 74 y.o.   MRN: [id number]  HPI 74 year old woman, former smoker (75 pack years) with a history of CAD, hypertension, cerebrovascular disease and COPD that has been followed by Dr Gwenette Greet. She had pulmonary function testing performed in August 2014 that I personally reviewed. These showed very severe obstruction with an FEV1 of 0.69 L or 32% of predicted, and a positive bronchodilator response. Her lung volumes are hyperinflated and the DLCO was not reported. She has been maintained on Spiriva once daily plus xopenex as needed. At her last visit in May Dr Gwenette Greet added Memory Dance. She reports that it didn't seem to change anything. She stopped it after several weeks.   2L/min at all times. She began to have cough and more exertional SOB about 4-6 weeks ago. Cough is dry. She is using xopenex prn, has started using a few times a day. Was given a steroid shot by Dr Jenny Reichmann 3 days ago but she feels about the same. She did not desat with ambulation at Dr Gwynn Burly office earlier this week on 2L/min.   She has significant exertional SOB. Has not needed pred in several years.    Review of Systems  Past Medical History  Diagnosis Date  . Impaired glucose tolerance 01/07/2011  . ANXIETY 01/01/2007  . BURSITIS, RIGHT HIP 06/04/2009  . CAD (coronary artery disease)     a. BMS to LAD 2010. b. NSTEMI with DES to LAD for ISR 2011. c. Patent stent 03/2012/Imdur added.  . CHEST PAIN-PRECORDIAL 01/15/2009  . COPD 01/01/2007    a. Chronic resp failure on home O2.  Marland Kitchen DEPRESSION 01/01/2007  . GROIN PAIN 06/20/2008  . Headache(784.0) 01/01/2007  . HYPERTENSION 01/01/2007  . LOW BACK PAIN 01/01/2007  . Muscle weakness (generalized) 06/04/2009  . OSTEOARTHRITIS, HIP 07/01/2008  . OSTEOPOROSIS 01/01/2007  . SYNCOPE 01/01/2007  . TRANSIENT ISCHEMIC ATTACK, HX OF 01/01/2007  . Eczema 01/08/2011  . Rosacea 01/08/2011  . Colon polyps     H/o tubular adenoma of colon    . Hemorrhoid   . Pneumonia 1998  . Cataract   . Abnormal chest x-ray     03/2012: will need OP f/u.  Marland Kitchen TIA (transient ischemic attack)   . Anginal pain   . Shortness of breath      Family History  Problem Relation Age of Onset  . Osteoporosis Mother   . Heart disease Sister   . Emphysema Sister   . Seizures Sister     epilepsy  . Cardiomyopathy Sister   . Heart attack Sister      Social History   Social History  . Marital Status: Married    Spouse Name: N/A  . Number of Children: 4  . Years of Education: N/A   Occupational History  . disabled back since 2003    Social History Main Topics  . Smoking status: Former Smoker -- 1.50 packs/day for 51 years    Types: Cigarettes    Quit date: 07/24/2005  . Smokeless tobacco: Never Used  . Alcohol Use: No  . Drug Use: No  . Sexual Activity: Not on file   Other Topics Concern  . Not on file   Social History Narrative     Allergies  Allergen Reactions  . Aspirin Other (See Comments)     cns bleed risk  . Other     Stiloto - severe reaction - caused inability to breath  .  Codeine Rash     Outpatient Prescriptions Prior to Visit  Medication Sig Dispense Refill  . ascorbic acid (VITAMIN C) 1000 MG tablet Take 1,000 mg by mouth daily.    . Cholecalciferol (VITAMIN D) 1000 UNITS capsule Take 1,000 Units by mouth every evening.     . clopidogrel (PLAVIX) 75 MG tablet TAKE 1 TABLET (75 MG TOTAL) BY MOUTH DAILY. 30 tablet 11  . Cyanocobalamin (VITAMIN B12 PO) Take 1,000 mg by mouth daily.    Marland Kitchen docusate sodium (COLACE) 100 MG capsule Take 100 mg by mouth daily as needed. For constipation    . isosorbide mononitrate (IMDUR) 30 MG 24 hr tablet TAKE 0.5 TABLETS (15 MG TOTAL) BY MOUTH DAILY. 45 tablet 3  . levalbuterol (XOPENEX HFA) 45 MCG/ACT inhaler Inhale 2 puffs into the lungs every 6 (six) hours as needed for wheezing. 3 Inhaler 3  . lovastatin (MEVACOR) 20 MG tablet Take 1 tablet (20 mg total) by mouth at bedtime. 90  tablet 1  . metoprolol tartrate (LOPRESSOR) 25 MG tablet TAKE 1/2 TABLET BY MOUTH 2 TIMES A DAY 30 tablet 11  . nitroGLYCERIN (NITROSTAT) 0.4 MG SL tablet Place 1 tablet (0.4 mg total) under the tongue every 5 (five) minutes as needed for chest pain (up to 3 doses). 25 tablet 4  . nortriptyline (PAMELOR) 10 MG capsule Take 10 mg by mouth at bedtime.     . ondansetron (ZOFRAN) 4 MG tablet Take 4 mg by mouth 3 (three) times daily as needed. For nausea (take with tramadol)    . polyvinyl alcohol (LIQUIFILM TEARS) 1.4 % ophthalmic solution Place 1 drop into both eyes daily.    Marland Kitchen tiotropium (SPIRIVA) 18 MCG inhalation capsule Place 18 mcg into inhaler and inhale daily.    . traMADol (ULTRAM) 50 MG tablet Take 100 mg by mouth 3 (three) times daily as needed. For pain    . triamcinolone cream (KENALOG) 0.1 % Apply 1 application topically 2 (two) times daily. 30 g 0  . Fluticasone Furoate-Vilanterol 100-25 MCG/INH AEPB 1 puff every morning (Patient not taking: Reported on 02/18/2015) 60 each 4   No facility-administered medications prior to visit.         Objective:   Physical Exam Filed Vitals:   02/21/15 1400  BP: 130/68  Pulse: 79  Height: 5\' 6"  (1.676 m)  Weight: 125 lb 12.8 oz (57.063 kg)  SpO2: 97%   Gen: Pleasant, thin elderly woman, in no distress,  normal affect, no distress on 2 L/m  ENT: No lesions,  mouth clear,  oropharynx clear, no postnasal drip  Neck: No JVD, no stridor  Lungs: No use of accessory muscles, very distant, barely any air movement, no apparent wheezing  Cardiovascular: RRR, heart sounds normal, no murmur or gallops, no peripheral edema  Musculoskeletal: No deformities, no cyanosis or clubbing  Neuro: alert, non focal  Skin: Warm, no lesions or rashes      Assessment & Plan:  Chronic respiratory failure Chronic hypoxemic respiratory failure. I suspect that this is largely due to her severe COPD, now with very limiting symptoms and poor exertional  tolerance. Certainly also consider the fact that she has coronary artery disease if she remains dyspneic and hypoxemic even after COPD is maximally treated. She had walking oximetry this week that did not show desaturation on 2 L/m. We will continue this dose  COPD (chronic obstructive pulmonary disease) with emphysema Given the description of her symptoms it is not clear that she is  experiencing an acute exacerbation. The pattern is consistent with a slow progressive worsening of cough and exertional dyspnea. All the same would like to treat her with a short steroid course to see if she rebounds. I'll also like to see if we can change Spiriva to Anoro to add the long-acting beta agonist component. Note she has not benefited from [place] or [employer]

## 2015-02-21 NOTE — Addendum Note (Signed)
Addended by: Mathis Dad on: 02/21/2015 02:49 PM   Modules accepted: Orders

## 2015-02-28 DIAGNOSIS — D0462 Carcinoma in situ of skin of left upper limb, including shoulder: Secondary | ICD-10-CM | POA: Diagnosis not present

## 2015-02-28 DIAGNOSIS — D225 Melanocytic nevi of trunk: Secondary | ICD-10-CM | POA: Diagnosis not present

## 2015-03-07 ENCOUNTER — Ambulatory Visit (INDEPENDENT_AMBULATORY_CARE_PROVIDER_SITE_OTHER): Payer: Medicare Other | Admitting: Adult Health

## 2015-03-07 ENCOUNTER — Encounter: Payer: Self-pay | Admitting: Adult Health

## 2015-03-07 VITALS — BP 116/84 | HR 77 | Temp 97.7°F | Ht 66.0 in | Wt 125.6 lb

## 2015-03-07 DIAGNOSIS — J439 Emphysema, unspecified: Secondary | ICD-10-CM | POA: Diagnosis not present

## 2015-03-07 DIAGNOSIS — J9611 Chronic respiratory failure with hypoxia: Secondary | ICD-10-CM

## 2015-03-07 NOTE — Patient Instructions (Addendum)
Stay on spiriva daily  Remain on Oxygen 2l/m .  follow up Dr. Lamonte Sakai  In 2 months and As needed

## 2015-03-07 NOTE — Progress Notes (Signed)
Subjective:    Patient ID: Alexis Higgins, female    DOB: 04-06-1941, 74 y.o.   MRN: [id number]  HPI 74 year old woman, former smoker (75 pack years) with a history of CAD, hypertension, cerebrovascular disease and COPD that has been followed by Dr Gwenette Greet.    PFT performed in August 2014 that I personally reviewed. These showed very severe obstruction with an FEV1 of 0.69 L or 32% of predicted, and a positive bronchodilator response.    03/07/15 Follow up :  COPD  Patient returns for a two-week follow-up. Patient was seen 2 weeks ago for COPD exacerbation She was treated with a prednisone taper. She says that she is feeling better. Her breathing is returned back to baseline Cough and shortness of breath have decreased. He denies any fever, chest pain, orthopnea, PND, or increased leg swelling She remains on Spiriva daily She is on oxygen at 2 L. Pneumovax Prevnar are up-to-date.     Review of Systems  Constitutional:   No  weight loss, night sweats,  Fevers, chills,  +fatigue, or  lassitude.  HEENT:   No headaches,  Difficulty swallowing,  Tooth/dental problems, or  Sore throat,                No sneezing, itching, ear ache, nasal congestion, post nasal drip,   CV:  No chest pain,  Orthopnea, PND, swelling in lower extremities, anasarca, dizziness, palpitations, syncope.   GI  No heartburn, indigestion, abdominal pain, nausea, vomiting, diarrhea, change in bowel habits, loss of appetite, bloody stools.   Resp: .  No chest wall deformity  Skin: no rash or lesions.  GU: no dysuria, change in color of urine, no urgency or frequency.  No flank pain, no hematuria   MS:  No joint pain or swelling.  No decreased range of motion.  No back pain.  Psych:  No change in mood or affect. No depression or anxiety.  No memory loss.       Past Medical History  Diagnosis Date  . Impaired glucose tolerance 01/07/2011  . ANXIETY 01/01/2007  . BURSITIS, RIGHT HIP 06/04/2009  . CAD  (coronary artery disease)     a. BMS to LAD 2010. b. NSTEMI with DES to LAD for ISR 2011. c. Patent stent 03/2012/Imdur added.  . CHEST PAIN-PRECORDIAL 01/15/2009  . COPD 01/01/2007    a. Chronic resp failure on home O2.  Marland Kitchen DEPRESSION 01/01/2007  . GROIN PAIN 06/20/2008  . Headache(784.0) 01/01/2007  . HYPERTENSION 01/01/2007  . LOW BACK PAIN 01/01/2007  . Muscle weakness (generalized) 06/04/2009  . OSTEOARTHRITIS, HIP 07/01/2008  . OSTEOPOROSIS 01/01/2007  . SYNCOPE 01/01/2007  . TRANSIENT ISCHEMIC ATTACK, HX OF 01/01/2007  . Eczema 01/08/2011  . Rosacea 01/08/2011  . Colon polyps     H/o tubular adenoma of colon  . Hemorrhoid   . Pneumonia 1998  . Cataract   . Abnormal chest x-ray     03/2012: will need OP f/u.  Marland Kitchen TIA (transient ischemic attack)   . Anginal pain ([place])   . Shortness of breath      Family History  Problem Relation Age of Onset  . Osteoporosis Mother   . Heart disease Sister   . Emphysema Sister   . Seizures Sister     epilepsy  . Cardiomyopathy Sister   . Heart attack Sister      Social History   Social History  . Marital Status: Married    Spouse Name: N/A  .  Number of Children: 4  . Years of Education: N/A   Occupational History  . disabled back since 2003    Social History Main Topics  . Smoking status: Former Smoker -- 1.50 packs/day for 51 years    Types: Cigarettes    Quit date: 07/24/2005  . Smokeless tobacco: Never Used  . Alcohol Use: No  . Drug Use: No  . Sexual Activity: Not on file   Other Topics Concern  . Not on file   Social History Narrative     Allergies  Allergen Reactions  . Aspirin Other (See Comments)     cns bleed risk  . Other     Stiloto - severe reaction - caused inability to breath  . Codeine Rash     Outpatient Prescriptions Prior to Visit  Medication Sig Dispense Refill  . ascorbic acid (VITAMIN C) 1000 MG tablet Take 1,000 mg by mouth daily.    . Cholecalciferol (VITAMIN D) 1000 UNITS capsule Take 1,000 Units  by mouth every evening.     . clopidogrel (PLAVIX) 75 MG tablet TAKE 1 TABLET (75 MG TOTAL) BY MOUTH DAILY. 30 tablet 11  . Cyanocobalamin (VITAMIN B12 PO) Take 1,000 mg by mouth daily.    Marland Kitchen docusate sodium (COLACE) 100 MG capsule Take 100 mg by mouth daily as needed. For constipation    . isosorbide mononitrate (IMDUR) 30 MG 24 hr tablet TAKE 0.5 TABLETS (15 MG TOTAL) BY MOUTH DAILY. 45 tablet 3  . levalbuterol (XOPENEX HFA) 45 MCG/ACT inhaler Inhale 2 puffs into the lungs every 6 (six) hours as needed for wheezing. 3 Inhaler 3  . lovastatin (MEVACOR) 20 MG tablet Take 1 tablet (20 mg total) by mouth at bedtime. 90 tablet 1  . metoprolol tartrate (LOPRESSOR) 25 MG tablet TAKE 1/2 TABLET BY MOUTH 2 TIMES A DAY 30 tablet 11  . nitroGLYCERIN (NITROSTAT) 0.4 MG SL tablet Place 1 tablet (0.4 mg total) under the tongue every 5 (five) minutes as needed for chest pain (up to 3 doses). 25 tablet 4  . nortriptyline (PAMELOR) 10 MG capsule Take 10 mg by mouth at bedtime.     . ondansetron (ZOFRAN) 4 MG tablet Take 4 mg by mouth 3 (three) times daily as needed. For nausea (take with tramadol)    . polyvinyl alcohol (LIQUIFILM TEARS) 1.4 % ophthalmic solution Place 1 drop into both eyes daily.    Marland Kitchen tiotropium (SPIRIVA) 18 MCG inhalation capsule Place 18 mcg into inhaler and inhale daily.    . traMADol (ULTRAM) 50 MG tablet Take 100 mg by mouth 3 (three) times daily as needed. For pain    . triamcinolone cream (KENALOG) 0.1 % Apply 1 application topically 2 (two) times daily. 30 g 0  . predniSONE (DELTASONE) 10 MG tablet Take 4 tablets x 3 days, 3 tabs x 3 days, 2 tabs x 3 days, 1 tab x 3 days, then STOP (Patient not taking: Reported on 03/07/2015) 30 tablet 0   No facility-administered medications prior to visit.         Objective:   Physical Exam Filed Vitals:   03/07/15 1412  BP: 116/84  Pulse: 77  Temp: 97.7 F (36.5 C)  TempSrc: Oral  Height: 5\' 6"  (1.676 m)  Weight: 125 lb 9.6 oz (56.972  kg)  SpO2: 98%   Gen: Pleasant, thin elderly woman, in no distress,  normal affect, no distress on 2 L/m  ENT: No lesions,  mouth clear,  oropharynx clear, no postnasal  drip  Neck: No JVD, no stridor  Lungs: No use of accessory muscles,  Decreased BS in bases , no  wheezing  Cardiovascular: RRR, heart sounds normal, no murmur or gallops, no peripheral edema  Musculoskeletal: No deformities, no cyanosis or clubbing  Neuro: alert, non focal  Skin: Warm, no lesions or rashes      Assessment & Plan:  No problem-specific assessment & plan notes found for this encounter.

## 2015-03-14 NOTE — Assessment & Plan Note (Signed)
Stay on spiriva daily  Remain on Oxygen 2l/m .  follow up Dr. Lamonte Sakai  In 2 months and As needed

## 2015-03-17 DIAGNOSIS — J449 Chronic obstructive pulmonary disease, unspecified: Secondary | ICD-10-CM | POA: Diagnosis not present

## 2015-03-20 DIAGNOSIS — J449 Chronic obstructive pulmonary disease, unspecified: Secondary | ICD-10-CM | POA: Diagnosis not present

## 2015-03-22 ENCOUNTER — Other Ambulatory Visit: Payer: Self-pay | Admitting: Internal Medicine

## 2015-03-24 ENCOUNTER — Encounter: Payer: Self-pay | Admitting: Internal Medicine

## 2015-04-04 DIAGNOSIS — Z85828 Personal history of other malignant neoplasm of skin: Secondary | ICD-10-CM | POA: Diagnosis not present

## 2015-04-04 DIAGNOSIS — L309 Dermatitis, unspecified: Secondary | ICD-10-CM | POA: Diagnosis not present

## 2015-04-04 DIAGNOSIS — Z08 Encounter for follow-up examination after completed treatment for malignant neoplasm: Secondary | ICD-10-CM | POA: Diagnosis not present

## 2015-04-11 DIAGNOSIS — G894 Chronic pain syndrome: Secondary | ICD-10-CM | POA: Diagnosis not present

## 2015-04-11 DIAGNOSIS — M961 Postlaminectomy syndrome, not elsewhere classified: Secondary | ICD-10-CM | POA: Diagnosis not present

## 2015-04-11 DIAGNOSIS — G4700 Insomnia, unspecified: Secondary | ICD-10-CM | POA: Diagnosis not present

## 2015-04-11 DIAGNOSIS — M47817 Spondylosis without myelopathy or radiculopathy, lumbosacral region: Secondary | ICD-10-CM | POA: Diagnosis not present

## 2015-04-17 DIAGNOSIS — J449 Chronic obstructive pulmonary disease, unspecified: Secondary | ICD-10-CM | POA: Diagnosis not present

## 2015-04-20 DIAGNOSIS — J449 Chronic obstructive pulmonary disease, unspecified: Secondary | ICD-10-CM | POA: Diagnosis not present

## 2015-04-23 ENCOUNTER — Encounter: Payer: Self-pay | Admitting: Emergency Medicine

## 2015-04-23 ENCOUNTER — Ambulatory Visit (INDEPENDENT_AMBULATORY_CARE_PROVIDER_SITE_OTHER): Payer: Medicare Other | Admitting: Emergency Medicine

## 2015-04-23 VITALS — BP 116/82 | HR 112 | Ht 66.0 in | Wt 126.0 lb

## 2015-04-23 DIAGNOSIS — J439 Emphysema, unspecified: Secondary | ICD-10-CM

## 2015-04-23 NOTE — Progress Notes (Signed)
Subjective:    Patient ID: Alexis Higgins, female    DOB: 07/20/1940, 74 y.o.   MRN: [id number]  HPI 74 year old woman, former smoker (75 pack years) with a history of CAD, hypertension, cerebrovascular disease and COPD that has been followed by Dr Gwenette Greet.    PFT performed in August 2014 that I personally reviewed. These showed very severe obstruction with an FEV1 of 0.69 L or 32% of predicted, and a positive bronchodilator response.    03/07/15 Follow up :  COPD  Patient returns for a two-week follow-up. Patient was seen 2 weeks ago for COPD exacerbation She was treated with a prednisone taper. She says that she is feeling better. Her breathing is returned back to baseline Cough and shortness of breath have decreased. He denies any fever, chest pain, orthopnea, PND, or increased leg swelling She remains on Spiriva daily She is on oxygen at 2 L. Pneumovax Prevnar are up-to-date.  ROV 04/23/15 -- follow-up visit for COPD. On her initial visit she was treated for an exacerbation with prednisone and antibiotics. She has been managed on Spiriva and oxygen at 2 L/m.  She reports that she has had increased wheeze over the last 3 weeks. Her breathing has been progressively worse over few years, more limiting over the last month. Dry cough. Has occasionally been w sputum. Breo and symbicort did not tolerate.     Review of Systems  As per HPI.    Past Medical History  Diagnosis Date  . Impaired glucose tolerance 01/07/2011  . ANXIETY 01/01/2007  . BURSITIS, RIGHT HIP 06/04/2009  . CAD (coronary artery disease)     a. BMS to LAD 2010. b. NSTEMI with DES to LAD for ISR 2011. c. Patent stent 03/2012/Imdur added.  . CHEST PAIN-PRECORDIAL 01/15/2009  . COPD 01/01/2007    a. Chronic resp failure on home O2.  Marland Kitchen DEPRESSION 01/01/2007  . GROIN PAIN 06/20/2008  . Headache(784.0) 01/01/2007  . HYPERTENSION 01/01/2007  . LOW BACK PAIN 01/01/2007  . Muscle weakness (generalized) 06/04/2009  .  OSTEOARTHRITIS, HIP 07/01/2008  . OSTEOPOROSIS 01/01/2007  . SYNCOPE 01/01/2007  . TRANSIENT ISCHEMIC ATTACK, HX OF 01/01/2007  . Eczema 01/08/2011  . Rosacea 01/08/2011  . Colon polyps     H/o tubular adenoma of colon  . Hemorrhoid   . Pneumonia 1998  . Cataract   . Abnormal chest x-ray     03/2012: will need OP f/u.  Marland Kitchen TIA (transient ischemic attack)   . Anginal pain ([place])   . Shortness of breath      Family History  Problem Relation Age of Onset  . Osteoporosis Mother   . Heart disease Sister   . Emphysema Sister   . Seizures Sister     epilepsy  . Cardiomyopathy Sister   . Heart attack Sister      Social History   Social History  . Marital Status: Married    Spouse Name: N/A  . Number of Children: 4  . Years of Education: N/A   Occupational History  . disabled back since 2003    Social History Main Topics  . Smoking status: Former Smoker -- 1.50 packs/day for 51 years    Types: Cigarettes    Quit date: 07/24/2005  . Smokeless tobacco: Never Used  . Alcohol Use: No  . Drug Use: No  . Sexual Activity: Not on file   Other Topics Concern  . Not on file   Social History Narrative  Allergies  Allergen Reactions  . Aspirin Other (See Comments)     cns bleed risk  . Other     Stiloto - severe reaction - caused inability to breath  . Codeine Rash     Outpatient Prescriptions Prior to Visit  Medication Sig Dispense Refill  . ascorbic acid (VITAMIN C) 1000 MG tablet Take 1,000 mg by mouth daily.    . Cholecalciferol (VITAMIN D) 1000 UNITS capsule Take 1,000 Units by mouth every evening.     . clopidogrel (PLAVIX) 75 MG tablet TAKE 1 TABLET (75 MG TOTAL) BY MOUTH DAILY. 30 tablet 11  . Cyanocobalamin (VITAMIN B12 PO) Take 1,000 mg by mouth daily.    Marland Kitchen docusate sodium (COLACE) 100 MG capsule Take 100 mg by mouth daily as needed. For constipation    . isosorbide mononitrate (IMDUR) 30 MG 24 hr tablet TAKE 0.5 TABLETS (15 MG TOTAL) BY MOUTH DAILY. 45 tablet 3    . levalbuterol (XOPENEX HFA) 45 MCG/ACT inhaler Inhale 2 puffs into the lungs every 6 (six) hours as needed for wheezing. 3 Inhaler 3  . lovastatin (MEVACOR) 20 MG tablet TAKE 1 TABLET BY MOUTH AT BEDTIME 90 tablet 3  . metoprolol tartrate (LOPRESSOR) 25 MG tablet TAKE 1/2 TABLET BY MOUTH 2 TIMES A DAY 30 tablet 11  . nitroGLYCERIN (NITROSTAT) 0.4 MG SL tablet Place 1 tablet (0.4 mg total) under the tongue every 5 (five) minutes as needed for chest pain (up to 3 doses). 25 tablet 4  . nortriptyline (PAMELOR) 10 MG capsule Take 10 mg by mouth at bedtime.     . ondansetron (ZOFRAN) 4 MG tablet Take 4 mg by mouth 3 (three) times daily as needed. For nausea (take with tramadol)    . polyvinyl alcohol (LIQUIFILM TEARS) 1.4 % ophthalmic solution Place 1 drop into both eyes daily.    Marland Kitchen tiotropium (SPIRIVA) 18 MCG inhalation capsule Place 18 mcg into inhaler and inhale daily.    . traMADol (ULTRAM) 50 MG tablet Take 100 mg by mouth 3 (three) times daily as needed. For pain    . triamcinolone cream (KENALOG) 0.1 % Apply 1 application topically 2 (two) times daily. 30 g 0  . lovastatin (MEVACOR) 20 MG tablet Take 1 tablet (20 mg total) by mouth at bedtime. 90 tablet 1  . predniSONE (DELTASONE) 10 MG tablet Take 4 tablets x 3 days, 3 tabs x 3 days, 2 tabs x 3 days, 1 tab x 3 days, then STOP (Patient not taking: Reported on 03/07/2015) 30 tablet 0   No facility-administered medications prior to visit.         Objective:   Physical Exam Filed Vitals:   04/23/15 1501  BP: 116/82  Pulse: 112  Height: 5\' 6"  (1.676 m)  Weight: 126 lb (57.153 kg)  SpO2: 96%   Gen: Pleasant, thin elderly woman, in no distress,  normal affect, no distress on 2 L/m  ENT: No lesions,  mouth clear,  oropharynx clear, no postnasal drip  Neck: No JVD, no stridor  Lungs: No use of accessory muscles,  Decreased BS in bases , no  wheezing  Cardiovascular: RRR, heart sounds normal, no murmur or gallops, no peripheral  edema  Musculoskeletal: No deformities, no cyanosis or clubbing  Neuro: alert, non focal  Skin: Warm, no lesions or rashes      Assessment & Plan:  COPD (chronic obstructive pulmonary disease) with emphysema Progressive worsening with decreased exertional tolerance. This did not appear to be an  exacerbation but instead just progression of her disease. I would like to try starting Anoro since this is the only combination bronchodilator that we have attempted thus far. She has not tolerated the others. I'll temporarily replace Spiriva and see if she can take this one. If she is unable or even if she is unable we will probably need to be thinking about chronic prednisone in the near future.   We will try stopping Spiriva and starting Anoro once a day.  Call our office if you have trouble taking this medication.  Continue albuterol as needed for shortness of breath.  We will call in a script for a wheeled walker  Continue oxygen at 2L/min We will consider possibly starting daily prednisone at some point in the future depending on how you are doing.  Follow with Dr Lamonte Sakai in 2 months or sooner if you have any problems.

## 2015-04-23 NOTE — Assessment & Plan Note (Signed)
Progressive worsening with decreased exertional tolerance. This did not appear to be an exacerbation but instead just progression of her disease. I would like to try starting Anoro since this is the only combination bronchodilator that we have attempted thus far. She has not tolerated the others. I'll temporarily replace Spiriva and see if she can take this one. If she is unable or even if she is unable we will probably need to be thinking about chronic prednisone in the near future.   We will try stopping Spiriva and starting Anoro once a day.  Call our office if you have trouble taking this medication.  Continue albuterol as needed for shortness of breath.  We will call in a script for a wheeled walker  Continue oxygen at 2L/min We will consider possibly starting daily prednisone at some point in the future depending on how you are doing.  Follow with Dr Lamonte Sakai in 2 months or sooner if you have any problems.

## 2015-04-23 NOTE — Patient Instructions (Signed)
We will try stopping Spiriva and starting Anoro once a day.  Call our office if you have trouble taking this medication.  Continue albuterol as needed for shortness of breath.  We will call in a script for a wheeled walker  Continue oxygen at 2L/min We will consider possibly starting daily prednisone at some point in the future depending on how you are doing.  Follow with Dr Lamonte Sakai in 2 months or sooner if you have any problems.

## 2015-04-23 NOTE — Addendum Note (Signed)
Addended by: Desmond Dike C on: 04/23/2015 03:36 PM   Modules accepted: Orders

## 2015-04-30 ENCOUNTER — Telehealth: Payer: Self-pay | Admitting: Emergency Medicine

## 2015-04-30 NOTE — Telephone Encounter (Signed)
Patient c/o wheezing and coughing that occurred prior to inhaler changes. Reviewed RB's recs. Pt stated she would like to try the delsym first and if this does not seem to help she would return call. Patient voiced understanding and had no further questions. Nothing further needed  Per RB If these symptoms are acute please schedule patient for an acute ov if delsym does not help If she feels this is the same chronic issues Advise her to continue current meds and discuss at next ov.

## 2015-04-30 NOTE — Telephone Encounter (Signed)
Sent message to [email or web address]  to take care of this order Alexis Higgins

## 2015-04-30 NOTE — Telephone Encounter (Signed)
Difficult to know what to recommend here in absence of an exam. She can try using delsym bid, we can prescribe tessalon 100mg  q6h prn.  Need to know whether she is wheezing, whether she believes our recent changes in her inhalers are influencing her cough

## 2015-04-30 NOTE — Telephone Encounter (Signed)
Order was placed on 11/30 in "other orders". Can't tell if anything has been done with order.  Called spoke with pt. She reports she is being told by [facility] this was never received. Please advise PCC's if this was sent? thanks

## 2015-04-30 NOTE — Telephone Encounter (Signed)
Called made pt aware. Nothing further needed 

## 2015-04-30 NOTE — Telephone Encounter (Signed)
Spoke with pt's spouse, pt c/o nonprod cough, coughing spells lasting 10-15 minutes, causing shortness of breath and chest tightness.  Pt has not taken anything otc or otherwise to help with these coughing spells.  Requesting something be called in.  Pt uses [facility] on [address]    RB please advise.  Thanks!

## 2015-05-01 DIAGNOSIS — J439 Emphysema, unspecified: Secondary | ICD-10-CM | POA: Diagnosis not present

## 2015-05-01 DIAGNOSIS — I2510 Atherosclerotic heart disease of native coronary artery without angina pectoris: Secondary | ICD-10-CM | POA: Diagnosis not present

## 2015-05-01 DIAGNOSIS — R0902 Hypoxemia: Secondary | ICD-10-CM | POA: Diagnosis not present

## 2015-05-01 DIAGNOSIS — R0689 Other abnormalities of breathing: Secondary | ICD-10-CM | POA: Diagnosis not present

## 2015-05-01 DIAGNOSIS — J449 Chronic obstructive pulmonary disease, unspecified: Secondary | ICD-10-CM | POA: Diagnosis not present

## 2015-05-05 ENCOUNTER — Ambulatory Visit (INDEPENDENT_AMBULATORY_CARE_PROVIDER_SITE_OTHER)
Admission: RE | Admit: 2015-05-05 | Discharge: 2015-05-05 | Disposition: A | Discharge status: Home / Self Care | Payer: Medicare Other | Source: Ambulatory Visit | Attending: Adult Health | Admitting: Adult Health

## 2015-05-05 ENCOUNTER — Ambulatory Visit (INDEPENDENT_AMBULATORY_CARE_PROVIDER_SITE_OTHER): Payer: Medicare Other | Admitting: Adult Health

## 2015-05-05 ENCOUNTER — Telehealth: Payer: Self-pay | Admitting: Emergency Medicine

## 2015-05-05 ENCOUNTER — Encounter: Payer: Self-pay | Admitting: Adult Health

## 2015-05-05 VITALS — BP 102/60 | HR 80 | Temp 98.5°F | Wt 128.0 lb

## 2015-05-05 DIAGNOSIS — J9611 Chronic respiratory failure with hypoxia: Secondary | ICD-10-CM

## 2015-05-05 DIAGNOSIS — J439 Emphysema, unspecified: Secondary | ICD-10-CM

## 2015-05-05 DIAGNOSIS — R05 Cough: Secondary | ICD-10-CM | POA: Diagnosis not present

## 2015-05-05 MED ORDER — PREDNISONE 10 MG PO TABS
ORAL_TABLET | ORAL | Status: DC
Start: 2015-05-05 — End: 2015-06-27

## 2015-05-05 MED ORDER — DOXYCYCLINE HYCLATE 100 MG PO TABS: 100.0000 mg | ORAL_TABLET | Freq: Two times a day (BID) | ORAL | Status: DC

## 2015-05-05 NOTE — Telephone Encounter (Signed)
Spoke with pt's husband.  Pt has been taking Delsym and is not helping.  C/o cough, wheezing and increased sob.  Appt given with TP today at 2:45.

## 2015-05-05 NOTE — Patient Instructions (Addendum)
Doxcycline 100mg  Twice daily  Take w/ food Prednisone taper over next week .  Mucinex DM Twice daily  As needed  Cough/congestion  Please contact office for sooner follow up if symptoms do not improve or worsen or seek emergency care  follow up Dr. Lamonte Sakai  As planned and As needed

## 2015-05-05 NOTE — Progress Notes (Signed)
Subjective:    Patient ID: Alexis Higgins, female    DOB: [birth year], 74 y.o.   MRN: [id number]  HPI 74 year old woman, former smoker (75 pack years) with a history of CAD, hypertension, cerebrovascular disease and COPD that has been followed by Dr Gwenette Greet.    PFT performed in August 2014 that I personally reviewed. These showed very severe obstruction with an FEV1 of 0.69 L or 32% of predicted, and a positive bronchodilator response.    05/05/2015 Acute OV : COPD  Patient presents for an acute office visit.  Complains of worsening SOB, wheezing, and productive cough x 1 week. Pt feels that it is due to Anoro. Pt stated  back on Spiriva 2 days ago. Coough and SOB not any better since medication change. Complains of cough and congestion with wheezing for 2-3 weeks, worse for last week.  Seen 2 weeks ago , started on [facility] , feels it made her worse. Now back on Sprivia  Has tried several inhalers in past with no perceived benefits or intolerability . ' CXR today shows COPD changes. No acute process.  She is on oxygen at 2 L. Pneumovax Prevnar are up-to-date. Denies chest pain, orthopnea, edema or fever.     Review of Systems  Constitutional:   No  weight loss, night sweats,  Fevers, chills,  +fatigue, or  lassitude.  HEENT:   No headaches,  Difficulty swallowing,  Tooth/dental problems, or  Sore throat,                No sneezing, itching, ear ache, nasal congestion, post nasal drip,   CV:  No chest pain,  Orthopnea, PND, swelling in lower extremities, anasarca, dizziness, palpitations, syncope.   GI  No heartburn, indigestion, abdominal pain, nausea, vomiting, diarrhea, change in bowel habits, loss of appetite, bloody stools.   Resp: .  No chest wall deformity  Skin: no rash or lesions.  GU: no dysuria, change in color of urine, no urgency or frequency.  No flank pain, no hematuria   MS:  No joint pain or swelling.  No decreased range of motion.  No back pain.  Psych:  No  change in mood or affect. No depression or anxiety.  No memory loss.       Past Medical History  Diagnosis Date  . Impaired glucose tolerance 01/07/2011  . ANXIETY 01/01/2007  . BURSITIS, RIGHT HIP 06/04/2009  . CAD (coronary artery disease)     a. BMS to LAD 2010. b. NSTEMI with DES to LAD for ISR 2011. c. Patent stent 03/2012/Imdur added.  . CHEST PAIN-PRECORDIAL 01/15/2009  . COPD 01/01/2007    a. Chronic resp failure on home O2.  Marland Kitchen DEPRESSION 01/01/2007  . GROIN PAIN 06/20/2008  . Headache(784.0) 01/01/2007  . HYPERTENSION 01/01/2007  . LOW BACK PAIN 01/01/2007  . Muscle weakness (generalized) 06/04/2009  . OSTEOARTHRITIS, HIP 07/01/2008  . OSTEOPOROSIS 01/01/2007  . SYNCOPE 01/01/2007  . TRANSIENT ISCHEMIC ATTACK, HX OF 01/01/2007  . Eczema 01/08/2011  . Rosacea 01/08/2011  . Colon polyps     H/o tubular adenoma of colon  . Hemorrhoid   . Pneumonia 1998  . Cataract   . Abnormal chest x-ray     03/2012: will need OP f/u.  Marland Kitchen TIA (transient ischemic attack)   . Anginal pain ([place])   . Shortness of breath      Family History  Problem Relation Age of Onset  . Osteoporosis Mother   . Heart disease Sister   .  Emphysema Sister   . Seizures Sister     epilepsy  . Cardiomyopathy Sister   . Heart attack Sister      Social History   Social History  . Marital Status: Married    Spouse Name: N/A  . Number of Children: 4  . Years of Education: N/A   Occupational History  . disabled back since 2003    Social History Main Topics  . Smoking status: Former Smoker -- 1.50 packs/day for 51 years    Types: Cigarettes    Quit date: 07/24/2005  . Smokeless tobacco: Never Used  . Alcohol Use: No  . Drug Use: No  . Sexual Activity: Not on file   Other Topics Concern  . Not on file   Social History Narrative     Allergies  Allergen Reactions  . Aspirin Other (See Comments)     cns bleed risk  . Other     Stiloto - severe reaction - caused inability to breath  . Codeine Rash      Outpatient Prescriptions Prior to Visit  Medication Sig Dispense Refill  . ascorbic acid (VITAMIN C) 1000 MG tablet Take 1,000 mg by mouth daily.    . Cholecalciferol (VITAMIN D) 1000 UNITS capsule Take 1,000 Units by mouth every evening.     . clopidogrel (PLAVIX) 75 MG tablet TAKE 1 TABLET (75 MG TOTAL) BY MOUTH DAILY. 30 tablet 11  . Cyanocobalamin (VITAMIN B12 PO) Take 1,000 mg by mouth daily.    Marland Kitchen docusate sodium (COLACE) 100 MG capsule Take 100 mg by mouth daily as needed. For constipation    . isosorbide mononitrate (IMDUR) 30 MG 24 hr tablet TAKE 0.5 TABLETS (15 MG TOTAL) BY MOUTH DAILY. 45 tablet 3  . levalbuterol (XOPENEX HFA) 45 MCG/ACT inhaler Inhale 2 puffs into the lungs every 6 (six) hours as needed for wheezing. 3 Inhaler 3  . lovastatin (MEVACOR) 20 MG tablet TAKE 1 TABLET BY MOUTH AT BEDTIME 90 tablet 3  . metoprolol tartrate (LOPRESSOR) 25 MG tablet TAKE 1/2 TABLET BY MOUTH 2 TIMES A DAY 30 tablet 11  . nitroGLYCERIN (NITROSTAT) 0.4 MG SL tablet Place 1 tablet (0.4 mg total) under the tongue every 5 (five) minutes as needed for chest pain (up to 3 doses). 25 tablet 4  . nortriptyline (PAMELOR) 10 MG capsule Take 10 mg by mouth at bedtime.     . ondansetron (ZOFRAN) 4 MG tablet Take 4 mg by mouth 3 (three) times daily as needed. For nausea (take with tramadol)    . polyvinyl alcohol (LIQUIFILM TEARS) 1.4 % ophthalmic solution Place 1 drop into both eyes daily.    Marland Kitchen tiotropium (SPIRIVA) 18 MCG inhalation capsule Place 18 mcg into inhaler and inhale daily.    . traMADol (ULTRAM) 50 MG tablet Take 100 mg by mouth 3 (three) times daily as needed. For pain    . triamcinolone cream (KENALOG) 0.1 % Apply 1 application topically 2 (two) times daily. 30 g 0   No facility-administered medications prior to visit.         Objective:   Physical Exam Filed Vitals:   05/05/15 1447  BP: 102/60  Pulse: 80  Temp: 98.5 F (36.9 C)  TempSrc: Oral  Weight: 128 lb (58.06 kg)    SpO2: 99%   Gen: Pleasant, thin elderly woman, in no distress,  normal affect, no distress on 2 L/m, in wc   ENT: No lesions,  mouth clear,  oropharynx clear, no postnasal  drip  Neck: No JVD, no stridor  Lungs: No use of accessory muscles,  Decreased BS in bases , no  wheezing  Cardiovascular: RRR, heart sounds normal, no murmur or gallops, no peripheral edema  Musculoskeletal: No deformities, no cyanosis or clubbing  Neuro: alert, non focal lSkin: Warm, no lesions or rashes   CXR 05/05/2015  COPD changes , no acute process Reviewed indenpendently       Assessment & Plan:  No problem-specific assessment & plan notes found for this encounter.

## 2015-05-06 NOTE — Assessment & Plan Note (Signed)
Cont on O2 .  

## 2015-05-06 NOTE — Assessment & Plan Note (Addendum)
Slow to resolve flare  cxr with chronic copd changes  Suspect would benefit from symbicort/dulera rx however she has been very intolerant to multiple inhalers in past.  (had reversibility on previous PFT)   Plan  Doxcycline 100mg  Twice daily  Take w/ food Prednisone taper over next week .  Mucinex DM Twice daily  As needed  Cough/congestion  Please contact office for sooner follow up if symptoms do not improve or worsen or seek emergency care  follow up Dr. Lamonte Sakai  As planned and As needed

## 2015-05-17 DIAGNOSIS — J449 Chronic obstructive pulmonary disease, unspecified: Secondary | ICD-10-CM | POA: Diagnosis not present

## 2015-05-20 DIAGNOSIS — J449 Chronic obstructive pulmonary disease, unspecified: Secondary | ICD-10-CM | POA: Diagnosis not present

## 2015-05-27 ENCOUNTER — Telehealth: Payer: Self-pay | Admitting: Cardiovascular Disease

## 2015-05-27 NOTE — Telephone Encounter (Signed)
Spoke with pt. She reports shortness of breath which she states could be related to her COPD.  Saw pulmonary on 05/04/16 and improved after antibiotic.  About a week ago developed shortness of breath and swelling in both feet and swelling in left leg in knee area to foot.  This AM swelling is "about gone".  She does not weigh daily.  Reports she ate more salty food over the holiday.  States she had dry cough prior to Christmas and it is now a "wet cough".  Has coughed up clear mucous one time since cough started and another time she states it looked like when she blows her nose.  Shortness of breath now is about the same as usual.  Since swelling has improved I asked her to continue to monitor and to try and keep feet elevated.  I asked her to follow up with pulmonary regarding cough and shortness of breath.

## 2015-05-27 NOTE — Telephone Encounter (Signed)
New message     Pt c/o swelling: STAT is pt has developed SOB within 24 hours  1. How long have you been experiencing swelling?  About 1 week 2. Where is the swelling located? Feet and legs 3.  Are you currently taking a "fluid pill"? no 4.  Are you currently SOB? Pt has COPD and is on oxygen  5.  Have you traveled recently? no

## 2015-05-27 NOTE — Telephone Encounter (Signed)
Agree. Thanks. chris 

## 2015-05-28 ENCOUNTER — Telehealth: Payer: Self-pay | Admitting: Internal Medicine

## 2015-05-28 MED ORDER — IPRATROPIUM-ALBUTEROL 0.5-2.5 (3) MG/3ML IN SOLN: 3.0000 mL | Freq: Four times a day (QID) | RESPIRATORY_TRACT | Status: DC | PRN

## 2015-05-28 MED ORDER — LEVALBUTEROL TARTRATE 45 MCG/ACT IN AERO: 2.0000 | INHALATION_SPRAY | Freq: Four times a day (QID) | RESPIRATORY_TRACT | Status: DC | PRN

## 2015-05-28 NOTE — Telephone Encounter (Signed)
Alexis Higgins for [employer] - done erx

## 2015-05-28 NOTE — Telephone Encounter (Signed)
Pt request refill for levalbuterol (XOPENEX HFA) 45 MCG/ACT inhaler to [facility]

## 2015-05-28 NOTE — Telephone Encounter (Signed)
Pt has called back stating insurance is no longer covering this. Is there an alternative that can be done.

## 2015-05-28 NOTE — Telephone Encounter (Signed)
Pt's husband has called back and she won't have enough till tomorrow and they are hoping to get something called in this evening. Can you please give them a call.

## 2015-05-28 NOTE — Addendum Note (Signed)
Addended by: Biagio Borg on: 05/28/2015 06:23 PM   Modules accepted: Orders

## 2015-05-29 MED ORDER — ALBUTEROL SULFATE HFA 108 (90 BASE) MCG/ACT IN AERS: 2.0000 | INHALATION_SPRAY | Freq: Four times a day (QID) | RESPIRATORY_TRACT | Status: DC | PRN

## 2015-05-29 NOTE — Telephone Encounter (Signed)
Ok for proair [facility] - done erx

## 2015-05-29 NOTE — Telephone Encounter (Signed)
Pt advised.

## 2015-05-29 NOTE — Telephone Encounter (Signed)
Pt is requesting a rescue inhaler, please advise

## 2015-05-29 NOTE — Addendum Note (Signed)
Addended by: Biagio Borg on: 05/29/2015 12:52 PM   Modules accepted: Orders

## 2015-06-03 ENCOUNTER — Telehealth: Payer: Self-pay | Admitting: Emergency Medicine

## 2015-06-03 MED ORDER — PREDNISONE 10 MG PO TABS
ORAL_TABLET | ORAL | Status: DC
Start: 2015-06-03 — End: 2015-06-27

## 2015-06-03 NOTE — Telephone Encounter (Signed)
Pt husband calling back stating that wife is having serious breathing issues and has been waiting on a response from nurse/dr since 11:09 SAID THEY WERE NOT TOLD THAT DR Lamonte Sakai WAS IN OFFICE TIL THIS EVENING, waiting on answer need asap can be reached @ [phone].Hillery Hunter

## 2015-06-03 NOTE — Telephone Encounter (Signed)
Spoke with pt's husband, states that he also wanted to add that pt has had increased dizziness since having to use rescue inhaler so much.  I advised that we were waiting on RB's recs for pt and that we would have recommendations to them today.  Pt's husband expressed understanding.  RB please advise.  Thanks.

## 2015-06-03 NOTE — Telephone Encounter (Signed)
Please have her start pred taper as below. When the taper finishes, have her staty on 10mg  daily maintenance dose.   Take 40mg  daily for 3 days, then 30mg  daily for 3 days, then 20mg  daily for 3 days, then 10mg  daily for 3 days, then 10mg  daily chronically.

## 2015-06-03 NOTE — Telephone Encounter (Signed)
Spoke with pt, c/o increased shortness of breath-using rescue inhaler 5+ times daily.  Notes chest tightness with SOB also.  Denies mucus production, chest pain, sinus congestion, fever.  Pt taking spiriva and rescue inhaler-was put on anoro, but this did not help with her breathing so she has d/c'ed this and gone back to spiriva.  Pt states that RB had mentioned starting maintenance prednisone at her last ov with him, wants to know if this is something to consider.   [facility] [address]   RB please advise.  Thanks.

## 2015-06-03 NOTE — Telephone Encounter (Signed)
Spoke with pt and spouse, aware of recs.  rx sent to pharmacy.  Nothing further needed.

## 2015-06-17 DIAGNOSIS — J449 Chronic obstructive pulmonary disease, unspecified: Secondary | ICD-10-CM | POA: Diagnosis not present

## 2015-06-20 DIAGNOSIS — J449 Chronic obstructive pulmonary disease, unspecified: Secondary | ICD-10-CM | POA: Diagnosis not present

## 2015-06-27 ENCOUNTER — Encounter: Payer: Self-pay | Admitting: Emergency Medicine

## 2015-06-27 ENCOUNTER — Ambulatory Visit (INDEPENDENT_AMBULATORY_CARE_PROVIDER_SITE_OTHER): Payer: Medicare Other | Admitting: Emergency Medicine

## 2015-06-27 VITALS — BP 112/72 | HR 84 | Ht 66.0 in | Wt 130.2 lb

## 2015-06-27 DIAGNOSIS — J9610 Chronic respiratory failure, unspecified whether with hypoxia or hypercapnia: Secondary | ICD-10-CM | POA: Diagnosis not present

## 2015-06-27 DIAGNOSIS — J439 Emphysema, unspecified: Secondary | ICD-10-CM

## 2015-06-27 MED ORDER — PREDNISONE 10 MG PO TABS: 10.0000 mg | ORAL_TABLET | Freq: Every day | ORAL | Status: DC

## 2015-06-27 NOTE — Assessment & Plan Note (Signed)
Improved exertional tolerance. She felt that the Anoro was ineffective and has restarted Spiriva. She is also now on prednisone 10mg  daily chronically. She has not tolerated other medications.   Continue Spiriva daily, prednisone 10mg  daily chronically,   Continue albuterol as needed for shortness of breath.  Home health assessment for equipment needs. Continue oxygen at 2L/min She will consider pulmonary rehabilitation and let our office know if she would like to pursue this. Follow with Dr Lamonte Sakai in 3 months or sooner if you have any problems.

## 2015-06-27 NOTE — Patient Instructions (Signed)
1. Please continue to take your prednisone and Spiriva daily. 2. We will ask your home health agency to assess you for equipment needs (ie wheelchair). 3. Please let us know if you decide to do pulmonary rehabilitation. 4. Please follow up in 3 months.

## 2015-06-27 NOTE — Progress Notes (Signed)
Subjective:    Patient ID: Alexis Higgins, female    DOB: [birth year], 75 y.o.   MRN: [id number]  HPI 75 year old woman, former smoker (75 pack years) with a history of CAD, hypertension, cerebrovascular disease and COPD that has been followed by Dr Gwenette Greet.    PFT performed in August 2014 that I personally reviewed. These showed very severe obstruction with an FEV1 of 0.69 L or 32% of predicted, and a positive bronchodilator response.    03/07/15 Follow up :  COPD  Patient returns for a two-week follow-up. Patient was seen 2 weeks ago for COPD exacerbation She was treated with a prednisone taper. She says that she is feeling better. Her breathing is returned back to baseline Cough and shortness of breath have decreased. He denies any fever, chest pain, orthopnea, PND, or increased leg swelling She remains on Spiriva daily She is on oxygen at 2 L. Pneumovax Prevnar are up-to-date.  ROV 04/23/15 -- follow-up visit for COPD. On her initial visit she was treated for an exacerbation with prednisone and antibiotics. She has been managed on Spiriva and oxygen at 2 L/m.  She reports that she has had increased wheeze over the last 3 weeks. Her breathing has been progressively worse over few years, more limiting over the last month. Dry cough. Has occasionally been w sputum. Breo and symbicort did not tolerate.   ROV 06/27/15 -- follow-up visit for COPD. She was prescribed Anoro 04/23/15 and told to stop Spiriva. She was seen by Rexene Edison 05/05/15 for an exacerbation and treated with doxycycline and prednisone taper. Her husband called 06/03/15 because she was having dyspnea and increased dizziness due to frequent albuterol use. She was started on another prednisone taper and continued on 10mg  daily chronically. She reports the Anoro did not work and caused her to have the exacerbation. She feels better overall since the last prednisone taper. She has had decreased exertional dyspnea. Denies cough.  Uses her albuterol every few days when she exerts herself. On 2L/m oxygen.  Review of Systems  As per HPI.    Past Medical History  Diagnosis Date  . Impaired glucose tolerance 01/07/2011  . ANXIETY 01/01/2007  . BURSITIS, RIGHT HIP 06/04/2009  . CAD (coronary artery disease)     a. BMS to LAD 2010. b. NSTEMI with DES to LAD for ISR 2011. c. Patent stent 03/2012/Imdur added.  . CHEST PAIN-PRECORDIAL 01/15/2009  . COPD 01/01/2007    a. Chronic resp failure on home O2.  Marland Kitchen DEPRESSION 01/01/2007  . GROIN PAIN 06/20/2008  . Headache(784.0) 01/01/2007  . HYPERTENSION 01/01/2007  . LOW BACK PAIN 01/01/2007  . Muscle weakness (generalized) 06/04/2009  . OSTEOARTHRITIS, HIP 07/01/2008  . OSTEOPOROSIS 01/01/2007  . SYNCOPE 01/01/2007  . TRANSIENT ISCHEMIC ATTACK, HX OF 01/01/2007  . Eczema 01/08/2011  . Rosacea 01/08/2011  . Colon polyps     H/o tubular adenoma of colon  . Hemorrhoid   . Pneumonia 1998  . Cataract   . Abnormal chest x-ray     03/2012: will need OP f/u.  Marland Kitchen TIA (transient ischemic attack)   . Anginal pain ([place])   . Shortness of breath      Family History  Problem Relation Age of Onset  . Osteoporosis Mother   . Heart disease Sister   . Emphysema Sister   . Seizures Sister     epilepsy  . Cardiomyopathy Sister   . Heart attack Sister      Social History  Social History  . Marital Status: Married    Spouse Name: N/A  . Number of Children: 4  . Years of Education: N/A   Occupational History  . disabled back since 2003    Social History Main Topics  . Smoking status: Former Smoker -- 1.50 packs/day for 51 years    Types: Cigarettes    Quit date: 07/24/2005  . Smokeless tobacco: Never Used  . Alcohol Use: No  . Drug Use: No  . Sexual Activity: Not on file   Other Topics Concern  . Not on file   Social History Narrative     Allergies  Allergen Reactions  . Aspirin Other (See Comments)     cns bleed risk  . Other     Stiloto - severe reaction - caused  inability to breath  . Codeine Rash     Outpatient Prescriptions Prior to Visit  Medication Sig Dispense Refill  . albuterol (PROVENTIL HFA;VENTOLIN HFA) 108 (90 Base) MCG/ACT inhaler Inhale 2 puffs into the lungs every 6 (six) hours as needed for wheezing or shortness of breath. 3 Inhaler 3  . ascorbic acid (VITAMIN C) 1000 MG tablet Take 1,000 mg by mouth daily.    . Cholecalciferol (VITAMIN D) 1000 UNITS capsule Take 1,000 Units by mouth every evening.     . clopidogrel (PLAVIX) 75 MG tablet TAKE 1 TABLET (75 MG TOTAL) BY MOUTH DAILY. 30 tablet 11  . Cyanocobalamin (VITAMIN B12 PO) Take 1,000 mg by mouth daily.    Marland Kitchen docusate sodium (COLACE) 100 MG capsule Take 100 mg by mouth daily as needed. For constipation    . isosorbide mononitrate (IMDUR) 30 MG 24 hr tablet TAKE 0.5 TABLETS (15 MG TOTAL) BY MOUTH DAILY. 45 tablet 3  . lovastatin (MEVACOR) 20 MG tablet TAKE 1 TABLET BY MOUTH AT BEDTIME 90 tablet 3  . metoprolol tartrate (LOPRESSOR) 25 MG tablet TAKE 1/2 TABLET BY MOUTH 2 TIMES A DAY 30 tablet 11  . nitroGLYCERIN (NITROSTAT) 0.4 MG SL tablet Place 1 tablet (0.4 mg total) under the tongue every 5 (five) minutes as needed for chest pain (up to 3 doses). 25 tablet 4  . nortriptyline (PAMELOR) 10 MG capsule Take 10 mg by mouth at bedtime.     . ondansetron (ZOFRAN) 4 MG tablet Take 4 mg by mouth 3 (three) times daily as needed. For nausea (take with tramadol)    . polyvinyl alcohol (LIQUIFILM TEARS) 1.4 % ophthalmic solution Place 1 drop into both eyes daily.    Marland Kitchen tiotropium (SPIRIVA) 18 MCG inhalation capsule Place 18 mcg into inhaler and inhale daily.    . traMADol (ULTRAM) 50 MG tablet Take 100 mg by mouth 3 (three) times daily as needed. For pain    . triamcinolone cream (KENALOG) 0.1 % Apply 1 application topically 2 (two) times daily. 30 g 0  . predniSONE (DELTASONE) 10 MG tablet Take 40mg  X3 days, then 30mg X3 days, then 20mg  X3 days, then stay at 10mg  daily. 60 tablet 0  .  doxycycline (VIBRA-TABS) 100 MG tablet Take 1 tablet (100 mg total) by mouth 2 (two) times daily. 14 tablet 0  . ipratropium-albuterol (DUONEB) 0.5-2.5 (3) MG/3ML SOLN Take 3 mLs by nebulization every 6 (six) hours as needed. 360 mL 11  . levalbuterol (XOPENEX HFA) 45 MCG/ACT inhaler Inhale 2 puffs into the lungs every 6 (six) hours as needed for wheezing. 3 Inhaler 1  . predniSONE (DELTASONE) 10 MG tablet 4 tabs for 2 days, then 3  tabs for 2 days, 2 tabs for 2 days, then 1 tab for 2 days, then stop 20 tablet 0   No facility-administered medications prior to visit.   Objective:   Physical Exam Filed Vitals:   06/27/15 1443  BP: 112/72  Pulse: 84  Height: 5\' 6"  (1.676 m)  Weight: 130 lb 3.2 oz (59.058 kg)  SpO2: 98%   Gen: Pleasant, thin elderly woman, in no distress,  normal affect, no distress on 2 L/m  ENT: No lesions,  mouth clear,  oropharynx clear, no postnasal drip  Neck: No JVD, no stridor  Lungs: No use of accessory muscles,  Decreased BS in bases , no  wheezing  Cardiovascular: RRR, heart sounds normal, no murmur or gallops, no peripheral edema  Musculoskeletal: No deformities, no cyanosis or clubbing  Neuro: alert, non focal  Skin: Warm, no lesions or rashes   Assessment & Plan:  COPD (chronic obstructive pulmonary disease) with emphysema Improved exertional tolerance. She felt that the Anoro was ineffective and has restarted Spiriva. She is also now on prednisone 10mg  daily chronically. She has not tolerated other medications.   Continue Spiriva daily, prednisone 10mg  daily chronically,   Continue albuterol as needed for shortness of breath.  Home health assessment for equipment needs. Continue oxygen at 2L/min She will consider pulmonary rehabilitation and let our office know if she would like to pursue this. Follow with Dr Lamonte Sakai in 3 months or sooner if you have any problems.   Jacques Earthly, MD  Internal Medicine PGY-2    Attending Note:  I have  examined patient, reviewed labs, studies and notes. I have discussed the case with Dr Randell Patient, and I agree with the data and plans as amended above.  Patient has severe COPD and at our last visit we attempted a trial of Anoro but she did not tolerate. She is actually been unable to tolerate almost any bronchodilator formulation with the exception of Spiriva. She changed back to her Spiriva in mid December and at that time she also was experiencing acute worsening consistent with an exacerbation. She was started on a prednisone taper with plans to get down to 10 mg and then stay at that dose indefinitely. She is improved today on the 10 mg. On my exam no wheezing but with distant BS. She had several questions all of which revolved around the risks and benefits of chronic steroids as well as other ways to improve her functional capacity. I recommended pulmonary rehabilitation and she is considering. She would like to call the rehabilitation center and discuss the possible regimen and schedule with them before having a referral. Also she asked if she might benefit from a wheelchair or other home health equipment. We will perform a home health assessment to sort this out. I'll follow-up with her in 3 months or sooner if needed.   Baltazar Apo, MD, PhD 06/27/2015, 5:12 PM [facility] [phone] or if no answer [phone]

## 2015-07-02 ENCOUNTER — Telehealth: Payer: Self-pay | Admitting: Emergency Medicine

## 2015-07-02 NOTE — Telephone Encounter (Signed)
Spoke with the pt  She states that she thought about going to [facility] out pt rehab, but wondered if they could help within the home if needed like [facility] can  I advised that she should just let [facility] come out as planned and let them see what she may qualify for  She verbalized understanding and states nothing further needed

## 2015-07-04 DIAGNOSIS — J449 Chronic obstructive pulmonary disease, unspecified: Secondary | ICD-10-CM | POA: Diagnosis not present

## 2015-07-04 DIAGNOSIS — M7071 Other bursitis of hip, right hip: Secondary | ICD-10-CM | POA: Diagnosis not present

## 2015-07-04 DIAGNOSIS — M6281 Muscle weakness (generalized): Secondary | ICD-10-CM | POA: Diagnosis not present

## 2015-07-04 DIAGNOSIS — M1612 Unilateral primary osteoarthritis, left hip: Secondary | ICD-10-CM | POA: Diagnosis not present

## 2015-07-07 ENCOUNTER — Telehealth: Payer: Self-pay | Admitting: Internal Medicine

## 2015-07-07 ENCOUNTER — Telehealth: Payer: Self-pay | Admitting: Emergency Medicine

## 2015-07-07 NOTE — Telephone Encounter (Signed)
Alexis Higgins is calling requesting VO for PT. Please advise Dr. Lamonte Sakai if okay to give? thanks

## 2015-07-07 NOTE — Telephone Encounter (Signed)
Verbal authorization given per MD protocol 

## 2015-07-07 NOTE — Telephone Encounter (Signed)
Alexis Higgins is calling for verbals for PT and orders for OT  PT- 1x/week for 1 week then 2x/ week for 6 weeks OT- for initial eval .

## 2015-07-08 DIAGNOSIS — J449 Chronic obstructive pulmonary disease, unspecified: Secondary | ICD-10-CM | POA: Diagnosis not present

## 2015-07-08 DIAGNOSIS — M1612 Unilateral primary osteoarthritis, left hip: Secondary | ICD-10-CM | POA: Diagnosis not present

## 2015-07-08 DIAGNOSIS — M7071 Other bursitis of hip, right hip: Secondary | ICD-10-CM | POA: Diagnosis not present

## 2015-07-08 DIAGNOSIS — M6281 Muscle weakness (generalized): Secondary | ICD-10-CM | POA: Diagnosis not present

## 2015-07-09 NOTE — Telephone Encounter (Signed)
RB please advise. Thanks.  

## 2015-07-10 DIAGNOSIS — J449 Chronic obstructive pulmonary disease, unspecified: Secondary | ICD-10-CM | POA: Diagnosis not present

## 2015-07-10 DIAGNOSIS — M1612 Unilateral primary osteoarthritis, left hip: Secondary | ICD-10-CM | POA: Diagnosis not present

## 2015-07-10 DIAGNOSIS — M7071 Other bursitis of hip, right hip: Secondary | ICD-10-CM | POA: Diagnosis not present

## 2015-07-10 DIAGNOSIS — M6281 Muscle weakness (generalized): Secondary | ICD-10-CM | POA: Diagnosis not present

## 2015-07-10 NOTE — Telephone Encounter (Signed)
Called and spoke with [place] at [facility]. I informed her of RB's VO. She stated she did not need any further information from our office. She voiced understanding and had no further questions. Nothing further needed at this time.

## 2015-07-10 NOTE — Telephone Encounter (Signed)
Yes OK to order as outlined below, thanks

## 2015-07-11 DIAGNOSIS — G894 Chronic pain syndrome: Secondary | ICD-10-CM | POA: Diagnosis not present

## 2015-07-11 DIAGNOSIS — M47817 Spondylosis without myelopathy or radiculopathy, lumbosacral region: Secondary | ICD-10-CM | POA: Diagnosis not present

## 2015-07-11 DIAGNOSIS — M961 Postlaminectomy syndrome, not elsewhere classified: Secondary | ICD-10-CM | POA: Diagnosis not present

## 2015-07-11 DIAGNOSIS — G4700 Insomnia, unspecified: Secondary | ICD-10-CM | POA: Diagnosis not present

## 2015-07-15 DIAGNOSIS — M6281 Muscle weakness (generalized): Secondary | ICD-10-CM | POA: Diagnosis not present

## 2015-07-15 DIAGNOSIS — J449 Chronic obstructive pulmonary disease, unspecified: Secondary | ICD-10-CM | POA: Diagnosis not present

## 2015-07-15 DIAGNOSIS — M7071 Other bursitis of hip, right hip: Secondary | ICD-10-CM | POA: Diagnosis not present

## 2015-07-15 DIAGNOSIS — M1612 Unilateral primary osteoarthritis, left hip: Secondary | ICD-10-CM | POA: Diagnosis not present

## 2015-07-18 DIAGNOSIS — J449 Chronic obstructive pulmonary disease, unspecified: Secondary | ICD-10-CM | POA: Diagnosis not present

## 2015-07-21 DIAGNOSIS — M6281 Muscle weakness (generalized): Secondary | ICD-10-CM | POA: Diagnosis not present

## 2015-07-21 DIAGNOSIS — M7071 Other bursitis of hip, right hip: Secondary | ICD-10-CM | POA: Diagnosis not present

## 2015-07-21 DIAGNOSIS — I25119 Atherosclerotic heart disease of native coronary artery with unspecified angina pectoris: Secondary | ICD-10-CM | POA: Diagnosis not present

## 2015-07-21 DIAGNOSIS — M810 Age-related osteoporosis without current pathological fracture: Secondary | ICD-10-CM | POA: Diagnosis not present

## 2015-07-21 DIAGNOSIS — I10 Essential (primary) hypertension: Secondary | ICD-10-CM | POA: Diagnosis not present

## 2015-07-21 DIAGNOSIS — J449 Chronic obstructive pulmonary disease, unspecified: Secondary | ICD-10-CM | POA: Diagnosis not present

## 2015-07-21 DIAGNOSIS — M1612 Unilateral primary osteoarthritis, left hip: Secondary | ICD-10-CM | POA: Diagnosis not present

## 2015-07-21 DIAGNOSIS — Z7951 Long term (current) use of inhaled steroids: Secondary | ICD-10-CM | POA: Diagnosis not present

## 2015-07-21 DIAGNOSIS — I739 Peripheral vascular disease, unspecified: Secondary | ICD-10-CM | POA: Diagnosis not present

## 2015-07-21 DIAGNOSIS — Z79891 Long term (current) use of opiate analgesic: Secondary | ICD-10-CM | POA: Diagnosis not present

## 2015-07-21 DIAGNOSIS — J9610 Chronic respiratory failure, unspecified whether with hypoxia or hypercapnia: Secondary | ICD-10-CM | POA: Diagnosis not present

## 2015-07-22 ENCOUNTER — Telehealth: Payer: Self-pay | Admitting: Internal Medicine

## 2015-07-22 DIAGNOSIS — Z79891 Long term (current) use of opiate analgesic: Secondary | ICD-10-CM | POA: Diagnosis not present

## 2015-07-22 DIAGNOSIS — J449 Chronic obstructive pulmonary disease, unspecified: Secondary | ICD-10-CM | POA: Diagnosis not present

## 2015-07-22 DIAGNOSIS — Z0000 Encounter for general adult medical examination without abnormal findings: Secondary | ICD-10-CM

## 2015-07-22 DIAGNOSIS — M7071 Other bursitis of hip, right hip: Secondary | ICD-10-CM | POA: Diagnosis not present

## 2015-07-22 DIAGNOSIS — I10 Essential (primary) hypertension: Secondary | ICD-10-CM | POA: Diagnosis not present

## 2015-07-22 DIAGNOSIS — I25119 Atherosclerotic heart disease of native coronary artery with unspecified angina pectoris: Secondary | ICD-10-CM | POA: Diagnosis not present

## 2015-07-22 DIAGNOSIS — M6281 Muscle weakness (generalized): Secondary | ICD-10-CM | POA: Diagnosis not present

## 2015-07-22 DIAGNOSIS — M1612 Unilateral primary osteoarthritis, left hip: Secondary | ICD-10-CM | POA: Diagnosis not present

## 2015-07-22 DIAGNOSIS — M810 Age-related osteoporosis without current pathological fracture: Secondary | ICD-10-CM | POA: Diagnosis not present

## 2015-07-22 DIAGNOSIS — Z7951 Long term (current) use of inhaled steroids: Secondary | ICD-10-CM | POA: Diagnosis not present

## 2015-07-22 DIAGNOSIS — J9610 Chronic respiratory failure, unspecified whether with hypoxia or hypercapnia: Secondary | ICD-10-CM | POA: Diagnosis not present

## 2015-07-22 DIAGNOSIS — I739 Peripheral vascular disease, unspecified: Secondary | ICD-10-CM | POA: Diagnosis not present

## 2015-07-22 NOTE — Telephone Encounter (Signed)
Please advise patient has an appointment with you on 3/17 and would like her labs put in ahead of time to have them done

## 2015-07-22 NOTE — Telephone Encounter (Signed)
Labs ordered before annual visit

## 2015-07-22 NOTE — Telephone Encounter (Signed)
Requesting order for home health nurse evaluation for the disease processing change.  Fax over order to [phone]

## 2015-07-22 NOTE — Telephone Encounter (Signed)
Has appointment coming up.  Wants labs entered before appt time .

## 2015-07-23 DIAGNOSIS — H5203 Hypermetropia, bilateral: Secondary | ICD-10-CM | POA: Diagnosis not present

## 2015-07-23 DIAGNOSIS — H52223 Regular astigmatism, bilateral: Secondary | ICD-10-CM | POA: Diagnosis not present

## 2015-07-28 ENCOUNTER — Telehealth: Payer: Self-pay | Admitting: Internal Medicine

## 2015-07-28 NOTE — Telephone Encounter (Signed)
Alexis Higgins w/ [facility] called to advise that after assessing the patient, she does not believe it necessary to continue OT. She wanted to let us know.

## 2015-07-29 DIAGNOSIS — M1612 Unilateral primary osteoarthritis, left hip: Secondary | ICD-10-CM | POA: Diagnosis not present

## 2015-07-29 DIAGNOSIS — I739 Peripheral vascular disease, unspecified: Secondary | ICD-10-CM | POA: Diagnosis not present

## 2015-07-29 DIAGNOSIS — I25119 Atherosclerotic heart disease of native coronary artery with unspecified angina pectoris: Secondary | ICD-10-CM | POA: Diagnosis not present

## 2015-07-29 DIAGNOSIS — M810 Age-related osteoporosis without current pathological fracture: Secondary | ICD-10-CM | POA: Diagnosis not present

## 2015-07-29 DIAGNOSIS — M6281 Muscle weakness (generalized): Secondary | ICD-10-CM | POA: Diagnosis not present

## 2015-07-29 DIAGNOSIS — Z7951 Long term (current) use of inhaled steroids: Secondary | ICD-10-CM | POA: Diagnosis not present

## 2015-07-29 DIAGNOSIS — J449 Chronic obstructive pulmonary disease, unspecified: Secondary | ICD-10-CM | POA: Diagnosis not present

## 2015-07-29 DIAGNOSIS — J9610 Chronic respiratory failure, unspecified whether with hypoxia or hypercapnia: Secondary | ICD-10-CM | POA: Diagnosis not present

## 2015-07-29 DIAGNOSIS — Z79891 Long term (current) use of opiate analgesic: Secondary | ICD-10-CM | POA: Diagnosis not present

## 2015-07-29 DIAGNOSIS — I10 Essential (primary) hypertension: Secondary | ICD-10-CM | POA: Diagnosis not present

## 2015-07-29 DIAGNOSIS — M7071 Other bursitis of hip, right hip: Secondary | ICD-10-CM | POA: Diagnosis not present

## 2015-07-30 ENCOUNTER — Telehealth: Payer: Self-pay

## 2015-07-30 NOTE — Telephone Encounter (Signed)
Home Health Cert/Plan of Care received (07/04/2015 - 09/01/2015) signed.  Faxed, copy sent to scan

## 2015-07-31 DIAGNOSIS — I739 Peripheral vascular disease, unspecified: Secondary | ICD-10-CM | POA: Diagnosis not present

## 2015-07-31 DIAGNOSIS — M1612 Unilateral primary osteoarthritis, left hip: Secondary | ICD-10-CM | POA: Diagnosis not present

## 2015-07-31 DIAGNOSIS — J9610 Chronic respiratory failure, unspecified whether with hypoxia or hypercapnia: Secondary | ICD-10-CM | POA: Diagnosis not present

## 2015-07-31 DIAGNOSIS — I10 Essential (primary) hypertension: Secondary | ICD-10-CM | POA: Diagnosis not present

## 2015-07-31 DIAGNOSIS — M810 Age-related osteoporosis without current pathological fracture: Secondary | ICD-10-CM | POA: Diagnosis not present

## 2015-07-31 DIAGNOSIS — M7071 Other bursitis of hip, right hip: Secondary | ICD-10-CM | POA: Diagnosis not present

## 2015-07-31 DIAGNOSIS — Z7951 Long term (current) use of inhaled steroids: Secondary | ICD-10-CM | POA: Diagnosis not present

## 2015-07-31 DIAGNOSIS — Z79891 Long term (current) use of opiate analgesic: Secondary | ICD-10-CM | POA: Diagnosis not present

## 2015-07-31 DIAGNOSIS — M6281 Muscle weakness (generalized): Secondary | ICD-10-CM | POA: Diagnosis not present

## 2015-07-31 DIAGNOSIS — I25119 Atherosclerotic heart disease of native coronary artery with unspecified angina pectoris: Secondary | ICD-10-CM | POA: Diagnosis not present

## 2015-07-31 DIAGNOSIS — J449 Chronic obstructive pulmonary disease, unspecified: Secondary | ICD-10-CM | POA: Diagnosis not present

## 2015-08-01 ENCOUNTER — Other Ambulatory Visit (INDEPENDENT_AMBULATORY_CARE_PROVIDER_SITE_OTHER): Payer: Medicare Other

## 2015-08-01 ENCOUNTER — Telehealth: Payer: Self-pay

## 2015-08-01 DIAGNOSIS — Z0000 Encounter for general adult medical examination without abnormal findings: Secondary | ICD-10-CM

## 2015-08-01 LAB — CBC WITH DIFFERENTIAL/PLATELET
BASOS PCT: 0.3 % (ref 0.0–3.0)
Basophils Absolute: 0 10*3/uL (ref 0.0–0.1)
EOS PCT: 2.7 % (ref 0.0–5.0)
Eosinophils Absolute: 0.4 10*3/uL (ref 0.0–0.7)
HCT: 40.7 % (ref 36.0–46.0)
Hemoglobin: 13.6 g/dL (ref 12.0–15.0)
LYMPHS ABS: 4.3 10*3/uL — AB (ref 0.7–4.0)
Lymphocytes Relative: 32 % (ref 12.0–46.0)
MCHC: 33.6 g/dL (ref 30.0–36.0)
MCV: 85.8 fl (ref 78.0–100.0)
Monocytes Absolute: 0.8 10*3/uL (ref 0.1–1.0)
Monocytes Relative: 6.2 % (ref 3.0–12.0)
NEUTROS PCT: 58.8 % (ref 43.0–77.0)
Neutro Abs: 8 10*3/uL — ABNORMAL HIGH (ref 1.4–7.7)
Platelets: 294 10*3/uL (ref 150.0–400.0)
RBC: 4.74 Mil/uL (ref 3.87–5.11)
RDW: 13.9 % (ref 11.5–15.5)
WBC: 13.6 10*3/uL — ABNORMAL HIGH (ref 4.0–10.5)

## 2015-08-01 LAB — URINALYSIS, ROUTINE W REFLEX MICROSCOPIC
Bilirubin Urine: NEGATIVE
Hgb urine dipstick: NEGATIVE
Ketones, ur: NEGATIVE
Leukocytes, UA: NEGATIVE
Nitrite: NEGATIVE
PH: 6 (ref 5.0–8.0)
Total Protein, Urine: NEGATIVE
Urine Glucose: NEGATIVE
Urobilinogen, UA: 0.2 (ref 0.0–1.0)

## 2015-08-01 LAB — BASIC METABOLIC PANEL
BUN: 9 mg/dL (ref 6–23)
CHLORIDE: 100 meq/L (ref 96–112)
CO2: 36 meq/L — AB (ref 19–32)
CREATININE: 0.82 mg/dL (ref 0.40–1.20)
Calcium: 9.1 mg/dL (ref 8.4–10.5)
GFR: 72.43 mL/min (ref >60.00)
Glucose, Bld: 84 mg/dL (ref 70–99)
POTASSIUM: 3.7 meq/L (ref 3.5–5.1)
Sodium: 143 mEq/L (ref 135–145)

## 2015-08-01 LAB — HEPATIC FUNCTION PANEL
ALBUMIN: 3.8 g/dL (ref 3.5–5.2)
ALK PHOS: 58 U/L (ref 39–117)
ALT: 14 U/L (ref 0–35)
AST: 19 U/L (ref 0–37)
BILIRUBIN TOTAL: 0.5 mg/dL (ref 0.2–1.2)
Bilirubin, Direct: 0.1 mg/dL (ref 0.0–0.3)
Total Protein: 6.3 g/dL (ref 6.0–8.3)

## 2015-08-01 LAB — TSH: TSH: 1.49 u[IU]/mL (ref 0.35–4.50)

## 2015-08-01 LAB — HEMOGLOBIN A1C: Hgb A1c MFr Bld: 5.6 % (ref 4.6–6.5)

## 2015-08-01 NOTE — Telephone Encounter (Signed)
New orders sent

## 2015-08-05 ENCOUNTER — Telehealth: Payer: Self-pay | Admitting: Internal Medicine

## 2015-08-05 DIAGNOSIS — J9610 Chronic respiratory failure, unspecified whether with hypoxia or hypercapnia: Secondary | ICD-10-CM | POA: Diagnosis not present

## 2015-08-05 DIAGNOSIS — I25119 Atherosclerotic heart disease of native coronary artery with unspecified angina pectoris: Secondary | ICD-10-CM | POA: Diagnosis not present

## 2015-08-05 DIAGNOSIS — Z79891 Long term (current) use of opiate analgesic: Secondary | ICD-10-CM | POA: Diagnosis not present

## 2015-08-05 DIAGNOSIS — I739 Peripheral vascular disease, unspecified: Secondary | ICD-10-CM | POA: Diagnosis not present

## 2015-08-05 DIAGNOSIS — J449 Chronic obstructive pulmonary disease, unspecified: Secondary | ICD-10-CM | POA: Diagnosis not present

## 2015-08-05 DIAGNOSIS — Z7951 Long term (current) use of inhaled steroids: Secondary | ICD-10-CM | POA: Diagnosis not present

## 2015-08-05 DIAGNOSIS — M6281 Muscle weakness (generalized): Secondary | ICD-10-CM | POA: Diagnosis not present

## 2015-08-05 DIAGNOSIS — I10 Essential (primary) hypertension: Secondary | ICD-10-CM | POA: Diagnosis not present

## 2015-08-05 DIAGNOSIS — M1612 Unilateral primary osteoarthritis, left hip: Secondary | ICD-10-CM | POA: Diagnosis not present

## 2015-08-05 DIAGNOSIS — M7071 Other bursitis of hip, right hip: Secondary | ICD-10-CM | POA: Diagnosis not present

## 2015-08-05 DIAGNOSIS — M810 Age-related osteoporosis without current pathological fracture: Secondary | ICD-10-CM | POA: Diagnosis not present

## 2015-08-05 NOTE — Telephone Encounter (Signed)
Ok for verbal 

## 2015-08-05 NOTE — Telephone Encounter (Signed)
Alexis Higgins from [facility] is requesting OT for 2 x for 4wks. She would like to start this week, if you can please give her a call today at [phone]

## 2015-08-07 DIAGNOSIS — I739 Peripheral vascular disease, unspecified: Secondary | ICD-10-CM | POA: Diagnosis not present

## 2015-08-07 DIAGNOSIS — J449 Chronic obstructive pulmonary disease, unspecified: Secondary | ICD-10-CM | POA: Diagnosis not present

## 2015-08-07 DIAGNOSIS — M1612 Unilateral primary osteoarthritis, left hip: Secondary | ICD-10-CM | POA: Diagnosis not present

## 2015-08-07 DIAGNOSIS — Z79891 Long term (current) use of opiate analgesic: Secondary | ICD-10-CM | POA: Diagnosis not present

## 2015-08-07 DIAGNOSIS — J9610 Chronic respiratory failure, unspecified whether with hypoxia or hypercapnia: Secondary | ICD-10-CM | POA: Diagnosis not present

## 2015-08-07 DIAGNOSIS — M810 Age-related osteoporosis without current pathological fracture: Secondary | ICD-10-CM | POA: Diagnosis not present

## 2015-08-07 DIAGNOSIS — M6281 Muscle weakness (generalized): Secondary | ICD-10-CM | POA: Diagnosis not present

## 2015-08-07 DIAGNOSIS — I10 Essential (primary) hypertension: Secondary | ICD-10-CM | POA: Diagnosis not present

## 2015-08-07 DIAGNOSIS — I25119 Atherosclerotic heart disease of native coronary artery with unspecified angina pectoris: Secondary | ICD-10-CM | POA: Diagnosis not present

## 2015-08-07 DIAGNOSIS — M7071 Other bursitis of hip, right hip: Secondary | ICD-10-CM | POA: Diagnosis not present

## 2015-08-07 DIAGNOSIS — Z7951 Long term (current) use of inhaled steroids: Secondary | ICD-10-CM | POA: Diagnosis not present

## 2015-08-08 ENCOUNTER — Ambulatory Visit (INDEPENDENT_AMBULATORY_CARE_PROVIDER_SITE_OTHER): Payer: Medicare Other | Admitting: Internal Medicine

## 2015-08-08 ENCOUNTER — Encounter: Payer: Self-pay | Admitting: Internal Medicine

## 2015-08-08 VITALS — BP 136/86 | HR 86 | Temp 98.1°F | Ht 66.0 in | Wt 133.0 lb

## 2015-08-08 DIAGNOSIS — R7302 Impaired glucose tolerance (oral): Secondary | ICD-10-CM

## 2015-08-08 DIAGNOSIS — I10 Essential (primary) hypertension: Secondary | ICD-10-CM

## 2015-08-08 DIAGNOSIS — E785 Hyperlipidemia, unspecified: Secondary | ICD-10-CM

## 2015-08-08 DIAGNOSIS — Z0000 Encounter for general adult medical examination without abnormal findings: Secondary | ICD-10-CM

## 2015-08-08 DIAGNOSIS — Z13820 Encounter for screening for osteoporosis: Secondary | ICD-10-CM | POA: Diagnosis not present

## 2015-08-08 DIAGNOSIS — J439 Emphysema, unspecified: Secondary | ICD-10-CM

## 2015-08-08 NOTE — Patient Instructions (Addendum)
Please continue all other medications as before, and refills have been done if requested.  Please have the pharmacy call with any other refills you may need.  Please continue your efforts at being more active, low cholesterol diet, and weight control.  You are otherwise up to date with prevention measures today.  Please keep your appointments with your specialists as you may have planned  Please schedule the bone density test before leaving today at the scheduling desk (where you check out)  Please return in 6 months, or sooner if needed, with Lab testing done 3-5 days before   

## 2015-08-08 NOTE — Progress Notes (Signed)
Subjective:    Patient ID: Alexis Higgins, female    DOB: 28-Sep-1940, 75 y.o.   MRN: [id number]  HPI  Here for wellness and f/u;  Overall doing ok;  Pt denies Chest pain, worsening SOB, DOE, wheezing, orthopnea, PND, worsening LE edema, palpitations, dizziness or syncope.  Pt denies neurological change such as new headache, facial or extremity weakness.  Pt denies polydipsia, polyuria, or low sugar symptoms. Pt states overall good compliance with treatment and medications, good tolerability, and has been trying to follow appropriate diet.  Pt denies worsening depressive symptoms, suicidal ideation or panic. No fever, night sweats, wt loss, loss of appetite, or other constitutional symptoms.  Pt states good ability with ADL's, has low fall risk, home safety reviewed and adequate, no other significant changes in hearing or vision. No current complaints Past Medical History  Diagnosis Date  . Impaired glucose tolerance 01/07/2011  . ANXIETY 01/01/2007  . BURSITIS, RIGHT HIP 06/04/2009  . CAD (coronary artery disease)     a. BMS to LAD 2010. b. NSTEMI with DES to LAD for ISR 2011. c. Patent stent 03/2012/Imdur added.  . CHEST PAIN-PRECORDIAL 01/15/2009  . COPD 01/01/2007    a. Chronic resp failure on home O2.  Marland Kitchen DEPRESSION 01/01/2007  . GROIN PAIN 06/20/2008  . Headache(784.0) 01/01/2007  . HYPERTENSION 01/01/2007  . LOW BACK PAIN 01/01/2007  . Muscle weakness (generalized) 06/04/2009  . OSTEOARTHRITIS, HIP 07/01/2008  . OSTEOPOROSIS 01/01/2007  . SYNCOPE 01/01/2007  . TRANSIENT ISCHEMIC ATTACK, HX OF 01/01/2007  . Eczema 01/08/2011  . Rosacea 01/08/2011  . Colon polyps     H/o tubular adenoma of colon  . Hemorrhoid   . Pneumonia 1998  . Cataract   . Abnormal chest x-ray     03/2012: will need OP f/u.  Marland Kitchen TIA (transient ischemic attack)   . Anginal pain ([place])   . Shortness of breath    Past Surgical History  Procedure Laterality Date  . Appendectomy    . Abdominal hysterectomy    .  Oophorectomy      one ovary  . Sp lumbar disc surgury      [name]  . S/p bilat cataract  2010  . Coronary stent placement    . Colonoscopy  01/25/2002    tubular adenoma,hemorrhoids, hyperplastic  colon polyps  . Colonoscopy  02/17/2005    hemorrhoids  . Coronary angioplasty    . Back surgery    . Hip surgery Left     DR XU     PROXIMAL NECK   . Hip arthroplasty Left 10/01/2013    Procedure: ARTHROPLASTY UNIPOLAR   HIP;  Surgeon: Marianna Payment, MD;  Location: [facility];  Service: Orthopedics;  Laterality: Left;  . Breast enhancement surgery    . Left heart catheterization with coronary angiogram N/A 04/13/2012    Procedure: LEFT HEART CATHETERIZATION WITH CORONARY ANGIOGRAM;  Surgeon: Burnell Blanks, MD;  Location[facility] C[facility] LAB;  Service: Cardiovascular;  Laterality: N/A;    reports that she quit smoking about 10 years ago. Her smoking use included Cigarettes. She has a 76.5 pack-year smoking history. She has never used smokeless tobacco. She reports that she does not drink alcohol or use illicit drugs. family history includes Cardiomyopathy in her sister; Emphysema in her sister; Heart attack in her sister; Heart disease in her sister; Osteoporosis in her mother; Seizures in her sister. Allergies  Allergen Reactions  . Aspirin Other (See Comments)  cns bleed risk  . Other     Stiloto - severe reaction - caused inability to breath  . Codeine Rash   Review of Systems Constitutional: Negative for increased diaphoresis, other activity, appetite or siginficant weight change other than noted HENT: Negative for worsening hearing loss, ear pain, facial swelling, mouth sores and neck stiffness.   Eyes: Negative for other worsening pain, redness or visual disturbance.  Respiratory: Negative for shortness of breath and wheezing  Cardiovascular: Negative for chest pain and palpitations.  Gastrointestinal: Negative for diarrhea, blood in stool, abdominal distention or other  pain Genitourinary: Negative for hematuria, flank pain or change in urine volume.  Musculoskeletal: Negative for myalgias or other joint complaints.  Skin: Negative for color change and wound or drainage.  Neurological: Negative for syncope and numbness. other than noted Hematological: Negative for adenopathy. or other swelling Psychiatric/Behavioral: Negative for hallucinations, SI, self-injury, decreased concentration or other worsening agitation.      Objective:   Physical Exam BP 136/86 mmHg  Pulse 86  Temp(Src) 98.1 F (36.7 C) (Oral)  Ht 5\' 6"  (1.676 m)  Wt 133 lb (60.328 kg)  BMI 21.48 kg/m2  SpO2 97% on home o2 VS noted,  Constitutional: Pt is oriented to person, place, and time. Appears well-developed and well-nourished, in no significant distress Head: Normocephalic and atraumatic.  Right Ear: External ear normal.  Left Ear: External ear normal.  Nose: Nose normal.  Mouth/Throat: Oropharynx is clear and moist.  Eyes: Conjunctivae and EOM are normal. Pupils are equal, round, and reactive to light.  Neck: Normal range of motion. Neck supple. No JVD present. No tracheal deviation present or significant neck LA or mass Cardiovascular: Normal rate, regular rhythm, normal heart sounds and intact distal pulses.   Pulmonary/Chest: Effort normal and breath sounds decreased without rales or wheezing  Abdominal: Soft. Bowel sounds are normal. NT. No HSM  Musculoskeletal: Normal range of motion. Exhibits no edema.  Lymphadenopathy:  Has no cervical adenopathy.  Neurological: Pt is alert and oriented to person, place, and time. Pt has normal reflexes. No cranial nerve deficit. Motor grossly intact Skin: Skin is warm and dry. No rash noted.  Psychiatric:  Has normal mood and affect. Behavior is normal.         Assessment & Plan:

## 2015-08-09 NOTE — Assessment & Plan Note (Signed)
stable overall by history and exam, recent data reviewed with pt, and pt to continue medical treatment as before,  to f/u any worsening symptoms or concerns BP Readings from Last 3 Encounters:  08/08/15 136/86  06/27/15 112/72  05/05/15 102/60

## 2015-08-09 NOTE — Assessment & Plan Note (Signed)
stable overall by history and exam, recent data reviewed with pt, and pt to continue medical treatment as before,  to f/u any worsening symptoms or concerns Lab Results  Component Value Date   LDLCALC 57 02/01/2014    

## 2015-08-09 NOTE — Assessment & Plan Note (Signed)
stable overall by history and exam, recent data reviewed with pt, and pt to continue medical treatment as before,  to f/u any worsening symptoms or concerns SpO2 Readings from Last 3 Encounters:  08/08/15 97%  06/27/15 98%  05/05/15 99%

## 2015-08-09 NOTE — Assessment & Plan Note (Signed)

## 2015-08-09 NOTE — Assessment & Plan Note (Signed)
stable overall by history and exam, recent data reviewed with pt, and pt to continue medical treatment as before,  to f/u any worsening symptoms or concerns Lab Results  Component Value Date   HGBA1C 5.6 08/01/2015

## 2015-08-11 DIAGNOSIS — J9610 Chronic respiratory failure, unspecified whether with hypoxia or hypercapnia: Secondary | ICD-10-CM | POA: Diagnosis not present

## 2015-08-11 DIAGNOSIS — Z7951 Long term (current) use of inhaled steroids: Secondary | ICD-10-CM | POA: Diagnosis not present

## 2015-08-11 DIAGNOSIS — M1612 Unilateral primary osteoarthritis, left hip: Secondary | ICD-10-CM | POA: Diagnosis not present

## 2015-08-11 DIAGNOSIS — I10 Essential (primary) hypertension: Secondary | ICD-10-CM | POA: Diagnosis not present

## 2015-08-11 DIAGNOSIS — I739 Peripheral vascular disease, unspecified: Secondary | ICD-10-CM | POA: Diagnosis not present

## 2015-08-11 DIAGNOSIS — I25119 Atherosclerotic heart disease of native coronary artery with unspecified angina pectoris: Secondary | ICD-10-CM | POA: Diagnosis not present

## 2015-08-11 DIAGNOSIS — M6281 Muscle weakness (generalized): Secondary | ICD-10-CM | POA: Diagnosis not present

## 2015-08-11 DIAGNOSIS — M810 Age-related osteoporosis without current pathological fracture: Secondary | ICD-10-CM | POA: Diagnosis not present

## 2015-08-11 DIAGNOSIS — Z79891 Long term (current) use of opiate analgesic: Secondary | ICD-10-CM | POA: Diagnosis not present

## 2015-08-11 DIAGNOSIS — J449 Chronic obstructive pulmonary disease, unspecified: Secondary | ICD-10-CM | POA: Diagnosis not present

## 2015-08-11 DIAGNOSIS — M7071 Other bursitis of hip, right hip: Secondary | ICD-10-CM | POA: Diagnosis not present

## 2015-08-12 ENCOUNTER — Other Ambulatory Visit: Payer: Self-pay | Admitting: Acute Care

## 2015-08-12 DIAGNOSIS — I25119 Atherosclerotic heart disease of native coronary artery with unspecified angina pectoris: Secondary | ICD-10-CM | POA: Diagnosis not present

## 2015-08-12 DIAGNOSIS — M810 Age-related osteoporosis without current pathological fracture: Secondary | ICD-10-CM | POA: Diagnosis not present

## 2015-08-12 DIAGNOSIS — I739 Peripheral vascular disease, unspecified: Secondary | ICD-10-CM | POA: Diagnosis not present

## 2015-08-12 DIAGNOSIS — Z79891 Long term (current) use of opiate analgesic: Secondary | ICD-10-CM | POA: Diagnosis not present

## 2015-08-12 DIAGNOSIS — M7071 Other bursitis of hip, right hip: Secondary | ICD-10-CM | POA: Diagnosis not present

## 2015-08-12 DIAGNOSIS — I10 Essential (primary) hypertension: Secondary | ICD-10-CM | POA: Diagnosis not present

## 2015-08-12 DIAGNOSIS — J449 Chronic obstructive pulmonary disease, unspecified: Secondary | ICD-10-CM | POA: Diagnosis not present

## 2015-08-12 DIAGNOSIS — Z7951 Long term (current) use of inhaled steroids: Secondary | ICD-10-CM | POA: Diagnosis not present

## 2015-08-12 DIAGNOSIS — M1612 Unilateral primary osteoarthritis, left hip: Secondary | ICD-10-CM | POA: Diagnosis not present

## 2015-08-12 DIAGNOSIS — M6281 Muscle weakness (generalized): Secondary | ICD-10-CM | POA: Diagnosis not present

## 2015-08-12 DIAGNOSIS — Z87891 Personal history of nicotine dependence: Secondary | ICD-10-CM

## 2015-08-12 DIAGNOSIS — J9610 Chronic respiratory failure, unspecified whether with hypoxia or hypercapnia: Secondary | ICD-10-CM | POA: Diagnosis not present

## 2015-08-14 DIAGNOSIS — M7071 Other bursitis of hip, right hip: Secondary | ICD-10-CM | POA: Diagnosis not present

## 2015-08-14 DIAGNOSIS — I10 Essential (primary) hypertension: Secondary | ICD-10-CM | POA: Diagnosis not present

## 2015-08-14 DIAGNOSIS — Z7951 Long term (current) use of inhaled steroids: Secondary | ICD-10-CM | POA: Diagnosis not present

## 2015-08-14 DIAGNOSIS — J449 Chronic obstructive pulmonary disease, unspecified: Secondary | ICD-10-CM | POA: Diagnosis not present

## 2015-08-14 DIAGNOSIS — J9610 Chronic respiratory failure, unspecified whether with hypoxia or hypercapnia: Secondary | ICD-10-CM | POA: Diagnosis not present

## 2015-08-14 DIAGNOSIS — Z79891 Long term (current) use of opiate analgesic: Secondary | ICD-10-CM | POA: Diagnosis not present

## 2015-08-14 DIAGNOSIS — I739 Peripheral vascular disease, unspecified: Secondary | ICD-10-CM | POA: Diagnosis not present

## 2015-08-14 DIAGNOSIS — M1612 Unilateral primary osteoarthritis, left hip: Secondary | ICD-10-CM | POA: Diagnosis not present

## 2015-08-14 DIAGNOSIS — M6281 Muscle weakness (generalized): Secondary | ICD-10-CM | POA: Diagnosis not present

## 2015-08-14 DIAGNOSIS — I25119 Atherosclerotic heart disease of native coronary artery with unspecified angina pectoris: Secondary | ICD-10-CM | POA: Diagnosis not present

## 2015-08-14 DIAGNOSIS — M810 Age-related osteoporosis without current pathological fracture: Secondary | ICD-10-CM | POA: Diagnosis not present

## 2015-08-15 ENCOUNTER — Ambulatory Visit (INDEPENDENT_AMBULATORY_CARE_PROVIDER_SITE_OTHER)
Admission: RE | Admit: 2015-08-15 | Discharge: 2015-08-15 | Disposition: A | Discharge status: Home / Self Care | Payer: Medicare Other | Source: Ambulatory Visit | Attending: Internal Medicine | Admitting: Internal Medicine

## 2015-08-15 DIAGNOSIS — J449 Chronic obstructive pulmonary disease, unspecified: Secondary | ICD-10-CM | POA: Diagnosis not present

## 2015-08-15 DIAGNOSIS — Z13820 Encounter for screening for osteoporosis: Secondary | ICD-10-CM | POA: Diagnosis not present

## 2015-08-18 DIAGNOSIS — I10 Essential (primary) hypertension: Secondary | ICD-10-CM | POA: Diagnosis not present

## 2015-08-18 DIAGNOSIS — M1612 Unilateral primary osteoarthritis, left hip: Secondary | ICD-10-CM | POA: Diagnosis not present

## 2015-08-18 DIAGNOSIS — J9610 Chronic respiratory failure, unspecified whether with hypoxia or hypercapnia: Secondary | ICD-10-CM | POA: Diagnosis not present

## 2015-08-18 DIAGNOSIS — Z79891 Long term (current) use of opiate analgesic: Secondary | ICD-10-CM | POA: Diagnosis not present

## 2015-08-18 DIAGNOSIS — Z7951 Long term (current) use of inhaled steroids: Secondary | ICD-10-CM | POA: Diagnosis not present

## 2015-08-18 DIAGNOSIS — J449 Chronic obstructive pulmonary disease, unspecified: Secondary | ICD-10-CM | POA: Diagnosis not present

## 2015-08-18 DIAGNOSIS — I25119 Atherosclerotic heart disease of native coronary artery with unspecified angina pectoris: Secondary | ICD-10-CM | POA: Diagnosis not present

## 2015-08-18 DIAGNOSIS — M810 Age-related osteoporosis without current pathological fracture: Secondary | ICD-10-CM | POA: Diagnosis not present

## 2015-08-18 DIAGNOSIS — M7071 Other bursitis of hip, right hip: Secondary | ICD-10-CM | POA: Diagnosis not present

## 2015-08-18 DIAGNOSIS — I739 Peripheral vascular disease, unspecified: Secondary | ICD-10-CM | POA: Diagnosis not present

## 2015-08-18 DIAGNOSIS — M6281 Muscle weakness (generalized): Secondary | ICD-10-CM | POA: Diagnosis not present

## 2015-08-21 DIAGNOSIS — I10 Essential (primary) hypertension: Secondary | ICD-10-CM | POA: Diagnosis not present

## 2015-08-21 DIAGNOSIS — M7071 Other bursitis of hip, right hip: Secondary | ICD-10-CM | POA: Diagnosis not present

## 2015-08-21 DIAGNOSIS — M810 Age-related osteoporosis without current pathological fracture: Secondary | ICD-10-CM | POA: Diagnosis not present

## 2015-08-21 DIAGNOSIS — I25119 Atherosclerotic heart disease of native coronary artery with unspecified angina pectoris: Secondary | ICD-10-CM | POA: Diagnosis not present

## 2015-08-21 DIAGNOSIS — J9610 Chronic respiratory failure, unspecified whether with hypoxia or hypercapnia: Secondary | ICD-10-CM | POA: Diagnosis not present

## 2015-08-21 DIAGNOSIS — Z79891 Long term (current) use of opiate analgesic: Secondary | ICD-10-CM | POA: Diagnosis not present

## 2015-08-21 DIAGNOSIS — I739 Peripheral vascular disease, unspecified: Secondary | ICD-10-CM | POA: Diagnosis not present

## 2015-08-21 DIAGNOSIS — J449 Chronic obstructive pulmonary disease, unspecified: Secondary | ICD-10-CM | POA: Diagnosis not present

## 2015-08-21 DIAGNOSIS — Z7951 Long term (current) use of inhaled steroids: Secondary | ICD-10-CM | POA: Diagnosis not present

## 2015-08-21 DIAGNOSIS — M6281 Muscle weakness (generalized): Secondary | ICD-10-CM | POA: Diagnosis not present

## 2015-08-21 DIAGNOSIS — M1612 Unilateral primary osteoarthritis, left hip: Secondary | ICD-10-CM | POA: Diagnosis not present

## 2015-08-25 DIAGNOSIS — M1612 Unilateral primary osteoarthritis, left hip: Secondary | ICD-10-CM | POA: Diagnosis not present

## 2015-08-25 DIAGNOSIS — M6281 Muscle weakness (generalized): Secondary | ICD-10-CM | POA: Diagnosis not present

## 2015-08-25 DIAGNOSIS — J9610 Chronic respiratory failure, unspecified whether with hypoxia or hypercapnia: Secondary | ICD-10-CM | POA: Diagnosis not present

## 2015-08-25 DIAGNOSIS — Z79891 Long term (current) use of opiate analgesic: Secondary | ICD-10-CM | POA: Diagnosis not present

## 2015-08-25 DIAGNOSIS — I739 Peripheral vascular disease, unspecified: Secondary | ICD-10-CM | POA: Diagnosis not present

## 2015-08-25 DIAGNOSIS — M7071 Other bursitis of hip, right hip: Secondary | ICD-10-CM | POA: Diagnosis not present

## 2015-08-25 DIAGNOSIS — I25119 Atherosclerotic heart disease of native coronary artery with unspecified angina pectoris: Secondary | ICD-10-CM | POA: Diagnosis not present

## 2015-08-25 DIAGNOSIS — M810 Age-related osteoporosis without current pathological fracture: Secondary | ICD-10-CM | POA: Diagnosis not present

## 2015-08-25 DIAGNOSIS — J449 Chronic obstructive pulmonary disease, unspecified: Secondary | ICD-10-CM | POA: Diagnosis not present

## 2015-08-25 DIAGNOSIS — Z7951 Long term (current) use of inhaled steroids: Secondary | ICD-10-CM | POA: Diagnosis not present

## 2015-08-25 DIAGNOSIS — I10 Essential (primary) hypertension: Secondary | ICD-10-CM | POA: Diagnosis not present

## 2015-08-28 DIAGNOSIS — Z7951 Long term (current) use of inhaled steroids: Secondary | ICD-10-CM | POA: Diagnosis not present

## 2015-08-28 DIAGNOSIS — Z79891 Long term (current) use of opiate analgesic: Secondary | ICD-10-CM | POA: Diagnosis not present

## 2015-08-28 DIAGNOSIS — J449 Chronic obstructive pulmonary disease, unspecified: Secondary | ICD-10-CM | POA: Diagnosis not present

## 2015-08-28 DIAGNOSIS — I739 Peripheral vascular disease, unspecified: Secondary | ICD-10-CM | POA: Diagnosis not present

## 2015-08-28 DIAGNOSIS — M1612 Unilateral primary osteoarthritis, left hip: Secondary | ICD-10-CM | POA: Diagnosis not present

## 2015-08-28 DIAGNOSIS — I10 Essential (primary) hypertension: Secondary | ICD-10-CM | POA: Diagnosis not present

## 2015-08-28 DIAGNOSIS — J9610 Chronic respiratory failure, unspecified whether with hypoxia or hypercapnia: Secondary | ICD-10-CM | POA: Diagnosis not present

## 2015-08-28 DIAGNOSIS — M6281 Muscle weakness (generalized): Secondary | ICD-10-CM | POA: Diagnosis not present

## 2015-08-28 DIAGNOSIS — M7071 Other bursitis of hip, right hip: Secondary | ICD-10-CM | POA: Diagnosis not present

## 2015-08-28 DIAGNOSIS — M810 Age-related osteoporosis without current pathological fracture: Secondary | ICD-10-CM | POA: Diagnosis not present

## 2015-08-28 DIAGNOSIS — I25119 Atherosclerotic heart disease of native coronary artery with unspecified angina pectoris: Secondary | ICD-10-CM | POA: Diagnosis not present

## 2015-09-13 ENCOUNTER — Other Ambulatory Visit: Payer: Self-pay | Admitting: Cardiovascular Disease

## 2015-09-15 DIAGNOSIS — J449 Chronic obstructive pulmonary disease, unspecified: Secondary | ICD-10-CM | POA: Diagnosis not present

## 2015-09-18 DIAGNOSIS — J449 Chronic obstructive pulmonary disease, unspecified: Secondary | ICD-10-CM | POA: Diagnosis not present

## 2015-09-26 ENCOUNTER — Encounter: Payer: Self-pay | Admitting: Emergency Medicine

## 2015-09-26 ENCOUNTER — Ambulatory Visit (INDEPENDENT_AMBULATORY_CARE_PROVIDER_SITE_OTHER): Payer: Medicare Other | Admitting: Emergency Medicine

## 2015-09-26 VITALS — BP 132/86 | HR 87 | Ht 66.0 in | Wt 136.0 lb

## 2015-09-26 DIAGNOSIS — J439 Emphysema, unspecified: Secondary | ICD-10-CM

## 2015-09-26 NOTE — Progress Notes (Signed)
Subjective:    Patient ID: Alexis Higgins, female    DOB: [birth year], 75 y.o.   MRN: [id number]  HPI 75 year old woman, former smoker (75 pack years) with a history of CAD, hypertension, cerebrovascular disease and COPD that has been followed by Dr Gwenette Greet.    PFT performed in August 2014 that I personally reviewed. These showed very severe obstruction with an FEV1 of 0.69 L or 32% of predicted, and a positive bronchodilator response.    03/07/15 Follow up :  COPD  Patient returns for a two-week follow-up. Patient was seen 2 weeks ago for COPD exacerbation She was treated with a prednisone taper. She says that she is feeling better. Her breathing is returned back to baseline Cough and shortness of breath have decreased. He denies any fever, chest pain, orthopnea, PND, or increased leg swelling She remains on Spiriva daily She is on oxygen at 2 L. Pneumovax Prevnar are up-to-date.  ROV 04/23/15 -- follow-up visit for COPD. On her initial visit she was treated for an exacerbation with prednisone and antibiotics. She has been managed on Spiriva and oxygen at 2 L/m.  She reports that she has had increased wheeze over the last 3 weeks. Her breathing has been progressively worse over few years, more limiting over the last month. Dry cough. Has occasionally been w sputum. Breo and symbicort did not tolerate.   ROV 06/27/15 -- follow-up visit for COPD. She was prescribed Anoro 04/23/15 and told to stop Spiriva. She was seen by Rexene Edison 05/05/15 for an exacerbation and treated with doxycycline and prednisone taper. Her husband called 06/03/15 because she was having dyspnea and increased dizziness due to frequent albuterol use. She was started on another prednisone taper and continued on 10mg  daily chronically. She reports the Anoro did not work and caused her to have the exacerbation. She feels better overall since the last prednisone taper. She has had decreased exertional dyspnea. Denies cough.  Uses her albuterol every few days when she exerts herself. On 2L/m oxygen.  ROV 09/26/15 -- follow-up visit for COPD. She has most recently been managed with chronic prednisone 10 mg daily. She is also on Spiriva qd, didn't feel that Anoro helped as much. She had been well until the last month, started to have some increased exertional SOB. No real change in mucous production. She has benefited from [facility] therapy, PT, Resp therapy. Her SABA use has improved.    Review of Systems  As per HPI.    Past Medical History  Diagnosis Date  . Impaired glucose tolerance 01/07/2011  . ANXIETY 01/01/2007  . BURSITIS, RIGHT HIP 06/04/2009  . CAD (coronary artery disease)     a. BMS to LAD 2010. b. NSTEMI with DES to LAD for ISR 2011. c. Patent stent 03/2012/Imdur added.  . CHEST PAIN-PRECORDIAL 01/15/2009  . COPD 01/01/2007    a. Chronic resp failure on home O2.  Marland Kitchen DEPRESSION 01/01/2007  . GROIN PAIN 06/20/2008  . Headache(784.0) 01/01/2007  . HYPERTENSION 01/01/2007  . LOW BACK PAIN 01/01/2007  . Muscle weakness (generalized) 06/04/2009  . OSTEOARTHRITIS, HIP 07/01/2008  . OSTEOPOROSIS 01/01/2007  . SYNCOPE 01/01/2007  . TRANSIENT ISCHEMIC ATTACK, HX OF 01/01/2007  . Eczema 01/08/2011  . Rosacea 01/08/2011  . Colon polyps     H/o tubular adenoma of colon  . Hemorrhoid   . Pneumonia 1998  . Cataract   . Abnormal chest x-ray     03/2012: will need OP f/u.  Marland Kitchen TIA (transient ischemic  attack)   . Anginal pain ([place])   . Shortness of breath      Family History  Problem Relation Age of Onset  . Osteoporosis Mother   . Heart disease Sister   . Emphysema Sister   . Seizures Sister     epilepsy  . Cardiomyopathy Sister   . Heart attack Sister      Social History   Social History  . Marital Status: Married    Spouse Name: N/A  . Number of Children: 4  . Years of Education: N/A   Occupational History  . disabled back since 2003    Social History Main Topics  . Smoking status: Former Smoker -- 1.50  packs/day for 51 years    Types: Cigarettes    Quit date: 07/24/2005  . Smokeless tobacco: Never Used  . Alcohol Use: No  . Drug Use: No  . Sexual Activity: Not on file   Other Topics Concern  . Not on file   Social History Narrative     Allergies  Allergen Reactions  . Aspirin Other (See Comments)     cns bleed risk  . Other     Stiloto - severe reaction - caused inability to breath  . Codeine Rash     Outpatient Prescriptions Prior to Visit  Medication Sig Dispense Refill  . albuterol (PROVENTIL HFA;VENTOLIN HFA) 108 (90 Base) MCG/ACT inhaler Inhale 2 puffs into the lungs every 6 (six) hours as needed for wheezing or shortness of breath. 3 Inhaler 3  . ascorbic acid (VITAMIN C) 1000 MG tablet Take 1,000 mg by mouth daily.    . Cholecalciferol (VITAMIN D) 1000 UNITS capsule Take 1,000 Units by mouth every evening.     . clopidogrel (PLAVIX) 75 MG tablet TAKE 1 TABLET (75 MG TOTAL) BY MOUTH DAILY. 30 tablet 1  . Cyanocobalamin (VITAMIN B12 PO) Take 1,000 mg by mouth daily.    Marland Kitchen docusate sodium (COLACE) 100 MG capsule Take 100 mg by mouth daily as needed. For constipation    . isosorbide mononitrate (IMDUR) 30 MG 24 hr tablet TAKE 0.5 TABLETS (15 MG TOTAL) BY MOUTH DAILY. 45 tablet 3  . lovastatin (MEVACOR) 20 MG tablet TAKE 1 TABLET BY MOUTH AT BEDTIME 90 tablet 3  . metoprolol tartrate (LOPRESSOR) 25 MG tablet TAKE 1/2 TABLET BY MOUTH 2 TIMES A DAY 30 tablet 1  . nitroGLYCERIN (NITROSTAT) 0.4 MG SL tablet Place 1 tablet (0.4 mg total) under the tongue every 5 (five) minutes as needed for chest pain (up to 3 doses). 25 tablet 4  . nortriptyline (PAMELOR) 10 MG capsule Take 10 mg by mouth at bedtime.     . ondansetron (ZOFRAN) 4 MG tablet Take 4 mg by mouth 3 (three) times daily as needed. For nausea (take with tramadol)    . polyvinyl alcohol (LIQUIFILM TEARS) 1.4 % ophthalmic solution Place 1 drop into both eyes daily.    . predniSONE (DELTASONE) 10 MG tablet Take 1 tablet (10  mg total) by mouth daily with breakfast. 30 tablet 4  . tiotropium (SPIRIVA) 18 MCG inhalation capsule Place 18 mcg into inhaler and inhale daily.    . traMADol (ULTRAM) 50 MG tablet Take 100 mg by mouth 3 (three) times daily as needed. For pain    . triamcinolone cream (KENALOG) 0.1 % Apply 1 application topically 2 (two) times daily. 30 g 0   No facility-administered medications prior to visit.   Objective:   Physical Exam Filed Vitals:  09/26/15 1344 09/26/15 1345  BP:  132/86  Pulse:  87  Height: 5\' 6"  (1.676 m)   Weight: 136 lb (61.689 kg)   SpO2:  94%   Gen: Pleasant, thin elderly woman, in no distress,  normal affect, no distress on 2 L/m  ENT: No lesions,  mouth clear,  oropharynx clear, no postnasal drip  Neck: No JVD, no stridor  Lungs: No use of accessory muscles,  Decreased BS in bases , no  wheezing  Cardiovascular: RRR, heart sounds normal, no murmur or gallops, no peripheral edema  Musculoskeletal: No deformities, no cyanosis or clubbing  Neuro: alert, non focal  Skin: Warm, no lesions or rashes   Assessment & Plan:  COPD (chronic obstructive pulmonary disease) with emphysema We will investigate the cost of participation for pulmonary rehab at [facility] Continue your Spiriva daily Continue your oxygen as you are using it Use your albuterol as needed.  Please continue your prednisone 10mg  daily. We will work on decreasing this in the future Follow with Dr Lamonte Sakai in 3 months or sooner if you have any problems.   Baltazar Apo, MD, PhD 09/26/2015, 2:11 PM  Pulmonary and Critical Care [phone] or if no answer [phone]

## 2015-09-26 NOTE — Patient Instructions (Addendum)
We will investigate the cost of participation for pulmonary rehab at [facility] Continue your Spiriva daily Continue your oxygen as you are using it Use your albuterol as needed.  Please continue your prednisone 10mg  daily. We will work on decreasing this in the future Follow with Dr Lamonte Sakai in 3 months or sooner if you have any problems.

## 2015-09-26 NOTE — Assessment & Plan Note (Signed)
We will investigate the cost of participation for pulmonary rehab at [facility] Continue your Spiriva daily Continue your oxygen as you are using it Use your albuterol as needed.  Please continue your prednisone 10mg  daily. We will work on decreasing this in the future Follow with Dr Lamonte Sakai in 3 months or sooner if you have any problems.

## 2015-09-27 ENCOUNTER — Other Ambulatory Visit: Payer: Self-pay | Admitting: Internal Medicine

## 2015-09-30 ENCOUNTER — Other Ambulatory Visit: Payer: Self-pay | Admitting: Internal Medicine

## 2015-10-01 ENCOUNTER — Encounter: Payer: Self-pay | Admitting: Internal Medicine

## 2015-10-03 DIAGNOSIS — M47817 Spondylosis without myelopathy or radiculopathy, lumbosacral region: Secondary | ICD-10-CM | POA: Diagnosis not present

## 2015-10-03 DIAGNOSIS — G894 Chronic pain syndrome: Secondary | ICD-10-CM | POA: Diagnosis not present

## 2015-10-03 DIAGNOSIS — M961 Postlaminectomy syndrome, not elsewhere classified: Secondary | ICD-10-CM | POA: Diagnosis not present

## 2015-10-03 DIAGNOSIS — G4700 Insomnia, unspecified: Secondary | ICD-10-CM | POA: Diagnosis not present

## 2015-10-08 ENCOUNTER — Ambulatory Visit: Payer: Medicare Other

## 2015-10-10 ENCOUNTER — Ambulatory Visit (INDEPENDENT_AMBULATORY_CARE_PROVIDER_SITE_OTHER)
Admission: RE | Admit: 2015-10-10 | Discharge: 2015-10-10 | Disposition: A | Discharge status: Home / Self Care | Payer: Medicare Other | Source: Ambulatory Visit | Attending: Acute Care | Admitting: Acute Care

## 2015-10-10 DIAGNOSIS — Z87891 Personal history of nicotine dependence: Secondary | ICD-10-CM

## 2015-10-15 ENCOUNTER — Telehealth: Payer: Self-pay | Admitting: Acute Care

## 2015-10-15 DIAGNOSIS — Z87891 Personal history of nicotine dependence: Secondary | ICD-10-CM

## 2015-10-15 DIAGNOSIS — J449 Chronic obstructive pulmonary disease, unspecified: Secondary | ICD-10-CM | POA: Diagnosis not present

## 2015-10-15 NOTE — Telephone Encounter (Signed)
I have called Alexis Higgins with the results of her low-dose CT screening. I explained that her scan was read as a lung RADS 2, nodules that are benign in both appearance and behavior. Recommendation per radiology is for repeat CT screening in 12 months, which we will call and schedule. This is peel verbalized understanding of the above and had no further questions. I will forward the results of the scan to Dr. Judi Cong, her primary care provider. We did discuss that she is taking a statin for elevated cholesterol.

## 2015-10-16 ENCOUNTER — Other Ambulatory Visit: Payer: Self-pay | Admitting: Cardiovascular Disease

## 2015-10-18 DIAGNOSIS — J449 Chronic obstructive pulmonary disease, unspecified: Secondary | ICD-10-CM | POA: Diagnosis not present

## 2015-11-11 ENCOUNTER — Other Ambulatory Visit: Payer: Self-pay | Admitting: Cardiovascular Disease

## 2015-11-14 ENCOUNTER — Encounter: Payer: Self-pay | Admitting: Cardiovascular Disease

## 2015-11-14 ENCOUNTER — Ambulatory Visit (INDEPENDENT_AMBULATORY_CARE_PROVIDER_SITE_OTHER): Payer: Medicare Other | Admitting: Cardiovascular Disease

## 2015-11-14 VITALS — BP 124/78 | HR 94 | Ht 66.0 in | Wt 143.0 lb

## 2015-11-14 DIAGNOSIS — I10 Essential (primary) hypertension: Secondary | ICD-10-CM

## 2015-11-14 DIAGNOSIS — E782 Mixed hyperlipidemia: Secondary | ICD-10-CM

## 2015-11-14 DIAGNOSIS — I2510 Atherosclerotic heart disease of native coronary artery without angina pectoris: Secondary | ICD-10-CM

## 2015-11-14 NOTE — Progress Notes (Signed)
Chief Complaint  Patient presents with  . Follow-up    1 year     History of Present Illness: 75 yo female with history of CAD, PAD, HTN, COPD who is here today for cardiac follow up. She has a hx of CAD, s/p BMS to LAD in 8/10, DES to LAD for ISR 2011. She was admitted to [facility] 11/20-11/21/13 with left shoulder pain. This was reminiscent of her previous angina. Cardiac markers were negative. LHC 04/13/12: pLAD stent patent, pRCA 30%, mRCA 10%, EF 60%. Medical therapy was recommended. She was placed on isosorbide. Chest x-ray demonstrated questionable left upper lobe nodule. Outpatient CT chest January 2014 with no worrisome masses or nodules. She is followed in the pulmonary office for her severe COPD. Recent CT chest stable 08/28/14.   She is here today for follow up. She is doing well. Dyspnea is stable. No chest pain. No orthopnea, PND. No syncope. She wears supplemental O2 all day. She does have some lower ext edema but resolves at night. No claudication type symptoms   Primary Care Physician: Cathlean Cower, MD   Past Medical History  Diagnosis Date  . Impaired glucose tolerance 01/07/2011  . ANXIETY 01/01/2007  . BURSITIS, RIGHT HIP 06/04/2009  . CAD (coronary artery disease)     a. BMS to LAD 2010. b. NSTEMI with DES to LAD for ISR 2011. c. Patent stent 03/2012/Imdur added.  . CHEST PAIN-PRECORDIAL 01/15/2009  . COPD 01/01/2007    a. Chronic resp failure on home O2.  Marland Kitchen DEPRESSION 01/01/2007  . GROIN PAIN 06/20/2008  . Headache(784.0) 01/01/2007  . HYPERTENSION 01/01/2007  . LOW BACK PAIN 01/01/2007  . Muscle weakness (generalized) 06/04/2009  . OSTEOARTHRITIS, HIP 07/01/2008  . OSTEOPOROSIS 01/01/2007  . SYNCOPE 01/01/2007  . TRANSIENT ISCHEMIC ATTACK, HX OF 01/01/2007  . Eczema 01/08/2011  . Rosacea 01/08/2011  . Colon polyps     H/o tubular adenoma of colon  . Hemorrhoid   . Pneumonia 1998  . Cataract   . Abnormal chest x-ray     03/2012: will need OP f/u.  Marland Kitchen TIA (transient  ischemic attack)   . Anginal pain ([place])   . Shortness of breath     Past Surgical History  Procedure Laterality Date  . Appendectomy    . Abdominal hysterectomy    . Oophorectomy      one ovary  . Sp lumbar disc surgury      [name]  . S/p bilat cataract  2010  . Coronary stent placement    . Colonoscopy  01/25/2002    tubular adenoma,hemorrhoids, hyperplastic  colon polyps  . Colonoscopy  02/17/2005    hemorrhoids  . Coronary angioplasty    . Back surgery    . Hip surgery Left     DR XU     PROXIMAL NECK   . Hip arthroplasty Left 10/01/2013    Procedure: ARTHROPLASTY UNIPOLAR   HIP;  Surgeon: Marianna Payment, MD;  Location: [facility];  Service: Orthopedics;  Laterality: Left;  . Breast enhancement surgery    . Left heart catheterization with coronary angiogram N/A 04/13/2012    Procedure: LEFT HEART CATHETERIZATION WITH CORONARY ANGIOGRAM;  Surgeon: Burnell Blanks, MD;  Location: [facility] CATH LAB;  Service: Cardiovascular;  Laterality: N/A;    Current Outpatient Prescriptions  Medication Sig Dispense Refill  . albuterol (PROVENTIL HFA;VENTOLIN HFA) 108 (90 Base) MCG/ACT inhaler Inhale 2 puffs into the lungs every 6 (six) hours as needed for wheezing  or shortness of breath. 3 Inhaler 3  . ascorbic acid (VITAMIN C) 1000 MG tablet Take 1,000 mg by mouth daily.    . Cholecalciferol (VITAMIN D) 1000 UNITS capsule Take 1,000 Units by mouth every evening.     . clopidogrel (PLAVIX) 75 MG tablet TAKE 1 TABLET (75 MG TOTAL) BY MOUTH DAILY. 30 tablet 0  . Cyanocobalamin (VITAMIN B12 PO) Take 1,000 mg by mouth daily.    Marland Kitchen docusate sodium (COLACE) 100 MG capsule Take 100 mg by mouth daily as needed. For constipation    . isosorbide mononitrate (IMDUR) 30 MG 24 hr tablet TAKE 0.5 TABLETS (15 MG TOTAL) BY MOUTH DAILY. 45 tablet 3  . lovastatin (MEVACOR) 20 MG tablet TAKE 1 TABLET BY MOUTH AT BEDTIME 90 tablet 3  . metoprolol tartrate (LOPRESSOR) 25 MG tablet TAKE 1/2 TABLET BY MOUTH 2  TIMES A DAY 30 tablet 0  . nitroGLYCERIN (NITROSTAT) 0.4 MG SL tablet Place 1 tablet (0.4 mg total) under the tongue every 5 (five) minutes as needed for chest pain (up to 3 doses). 25 tablet 4  . nortriptyline (PAMELOR) 10 MG capsule Take 10 mg by mouth at bedtime.     . ondansetron (ZOFRAN) 4 MG tablet Take 4 mg by mouth 3 (three) times daily as needed. For nausea (take with tramadol)    . polyvinyl alcohol (LIQUIFILM TEARS) 1.4 % ophthalmic solution Place 1 drop into both eyes daily.    . predniSONE (DELTASONE) 10 MG tablet Take 1 tablet (10 mg total) by mouth daily with breakfast. 30 tablet 4  . SPIRIVA HANDIHALER 18 MCG inhalation capsule INHALE 1 CAPSULE VIA HANDIHALER ONCE DAILY AT THE SAME TIME EVERY DAY 90 capsule 1  . traMADol (ULTRAM) 50 MG tablet Take 100 mg by mouth 3 (three) times daily as needed. For pain    . triamcinolone cream (KENALOG) 0.1 % Apply 1 application topically 2 (two) times daily. 30 g 0   No current facility-administered medications for this visit.    Allergies  Allergen Reactions  . Aspirin Other (See Comments)     cns bleed risk  . Other     Stiloto - severe reaction - caused inability to breath  . Codeine Rash    Social History   Social History  . Marital Status: Married    Spouse Name: N/A  . Number of Children: 4  . Years of Education: N/A   Occupational History  . disabled back since 2003    Social History Main Topics  . Smoking status: Former Smoker -- 1.50 packs/day for 51 years    Types: Cigarettes    Quit date: 07/24/2005  . Smokeless tobacco: Never Used  . Alcohol Use: No  . Drug Use: No  . Sexual Activity: Not on file   Other Topics Concern  . Not on file   Social History Narrative    Family History  Problem Relation Age of Onset  . Osteoporosis Mother   . Heart disease Sister   . Emphysema Sister   . Seizures Sister     epilepsy  . Cardiomyopathy Sister   . Heart attack Sister     Review of Systems:  As stated in  the HPI and otherwise negative.   BP 124/78 mmHg  Pulse 94  Ht 5\' 6"  (1.676 m)  Wt 143 lb (64.864 kg)  BMI 23.09 kg/m2  Physical Examination: General: Well developed, well nourished, NAD HEENT: OP clear, mucus membranes moist SKIN: warm, dry. No rashes.  Neuro: No focal deficits Musculoskeletal: Muscle strength 5/5 all ext Psychiatric: Mood and affect normal Neck: No JVD, no carotid bruits, no thyromegaly, no lymphadenopathy. Lungs:Clear bilaterally, no wheezes, rhonci, crackles Cardiovascular: Regular rate and rhythm. No murmurs, gallops or rubs. Abdomen:Soft. Bowel sounds present. Non-tender.  Extremities: No lower extremity edema. Pulses are 2 + in the bilateral DP/PT.  Chest CT: 06/01/12:  1. No suspicious pulmonary nodule in the left upper lobe to correspond to the plain film abnormality which likely represented a summation of shadows. 2. Stable small pulmonary nodules in the right middle lobe. 3. Central lobular emphysema again demonstrated. 4. Coronary calcifications.  EKG:  EKG is  ordered today. The ekg ordered today demonstrates NSR, rate 94 bpm. Non-specific ST abnormality   Recent Labs: 08/01/2015: ALT 14; BUN 9; Creatinine, Ser 0.82; Hemoglobin 13.6; Platelets 294.0; Potassium 3.7; Sodium 143; TSH 1.49   Lipid Panel    Component Value Date/Time   CHOL 159 02/14/2015 0930   TRIG 251.0* 02/14/2015 0930   HDL 53.50 02/14/2015 0930   CHOLHDL 3 02/14/2015 0930   VLDL 50.2* 02/14/2015 0930   LDLCALC 57 02/01/2014 1418   LDLDIRECT 67.0 02/14/2015 0930     Wt Readings from Last 3 Encounters:  11/14/15 143 lb (64.864 kg)  09/26/15 136 lb (61.689 kg)  08/08/15 133 lb (60.328 kg)     Other studies Reviewed: Additional studies/ records that were reviewed today include: . Review of the above records demonstrates:    Assessment and Plan:   1. Coronary Artery Disease: Stable. No angina. Tolerating Imdur and beta blocker. Continue Plavix, beta blocker, statin and  nitrates.   2. Hyperlipidemia: Controlled. Continue statin.   3. Hypertension: Controlled. Continue current therapy.   4. Severe COPD: She is now on Prednisone and continuous supplemental O2.  She is limited by her COPD  Current medicines are reviewed at length with the patient today.  The patient does not have concerns regarding medicines.  The following changes have been made:  no change  Labs/ tests ordered today include:   Orders Placed This Encounter  Procedures  . EKG 12-Lead    Disposition:   FU with me in 12  months  Signed, Lauree Chandler, MD 11/14/2015 4:13 PM    [facility] [address], [place], [place]  [address] Phone: [phone]; Fax: [phone]

## 2015-11-14 NOTE — Patient Instructions (Signed)

## 2015-11-15 DIAGNOSIS — J449 Chronic obstructive pulmonary disease, unspecified: Secondary | ICD-10-CM | POA: Diagnosis not present

## 2015-11-18 DIAGNOSIS — J449 Chronic obstructive pulmonary disease, unspecified: Secondary | ICD-10-CM | POA: Diagnosis not present

## 2015-12-11 ENCOUNTER — Other Ambulatory Visit: Payer: Self-pay | Admitting: *Deleted

## 2015-12-11 ENCOUNTER — Other Ambulatory Visit: Payer: Self-pay | Admitting: Emergency Medicine

## 2015-12-11 MED ORDER — METOPROLOL TARTRATE 25 MG PO TABS: ORAL_TABLET | ORAL | Status: DC

## 2015-12-11 MED ORDER — ISOSORBIDE MONONITRATE ER 30 MG PO TB24: ORAL_TABLET | ORAL | Status: DC

## 2015-12-11 MED ORDER — CLOPIDOGREL BISULFATE 75 MG PO TABS: ORAL_TABLET | ORAL | Status: DC

## 2015-12-15 DIAGNOSIS — J449 Chronic obstructive pulmonary disease, unspecified: Secondary | ICD-10-CM | POA: Diagnosis not present

## 2015-12-18 DIAGNOSIS — J449 Chronic obstructive pulmonary disease, unspecified: Secondary | ICD-10-CM | POA: Diagnosis not present

## 2015-12-31 ENCOUNTER — Ambulatory Visit (INDEPENDENT_AMBULATORY_CARE_PROVIDER_SITE_OTHER): Payer: Medicare Other | Admitting: Emergency Medicine

## 2015-12-31 ENCOUNTER — Encounter: Payer: Self-pay | Admitting: Emergency Medicine

## 2015-12-31 DIAGNOSIS — J9610 Chronic respiratory failure, unspecified whether with hypoxia or hypercapnia: Secondary | ICD-10-CM | POA: Diagnosis not present

## 2015-12-31 DIAGNOSIS — J439 Emphysema, unspecified: Secondary | ICD-10-CM | POA: Diagnosis not present

## 2015-12-31 MED ORDER — PREDNISONE 1 MG PO TABS: ORAL_TABLET | ORAL | 0 refills | Status: DC

## 2015-12-31 NOTE — Assessment & Plan Note (Signed)
Please continue your spiriva and albuterol  Continue oxygen at 2l/min We will decrease prednisone to 8mg  daily for 3 weeks. If tolerated you can decreased by 1mg  every 3 weeks down to 5mg  daily. If you achieve this, stay on 5mg  daily until we follow up.  Get the flu shot this Fall.  We will write a script for [facility] to provide a wheelchair Follow with Dr Lamonte Sakai in 3 months or sooner if you have any problems.

## 2015-12-31 NOTE — Assessment & Plan Note (Signed)
O2 at 2 L/m

## 2015-12-31 NOTE — Patient Instructions (Addendum)
Please continue your spiriva and albuterol  Continue oxygen at 2l/min We will decrease prednisone to 8mg  daily for 3 weeks. If tolerated you can decreased by 1mg  every 3 weeks down to 5mg  daily. If you achieve this, stay on 5mg  daily until we follow up.  Get the flu shot this Fall.  We will write a script for [facility] to provide a wheelchair Follow with Dr Lamonte Sakai in 3 months or sooner if you have any problems.

## 2015-12-31 NOTE — Progress Notes (Signed)
Subjective:    Patient ID: Alexis Higgins, female    DOB: Nov 08, 1940, 75 y.o.   MRN: [id number]  HPI 75 year old woman, former smoker (75 pack years) with a history of CAD, hypertension, cerebrovascular disease and COPD that has been followed by Dr Gwenette Greet.    PFT performed in August 2014 that I personally reviewed. These showed very severe obstruction with an FEV1 of 0.69 L or 32% of predicted, and a positive bronchodilator response.    03/07/15 Follow up :  COPD  Patient returns for a two-week follow-up. Patient was seen 2 weeks ago for COPD exacerbation She was treated with a prednisone taper. She says that she is feeling better. Her breathing is returned back to baseline Cough and shortness of breath have decreased. He denies any fever, chest pain, orthopnea, PND, or increased leg swelling She remains on Spiriva daily She is on oxygen at 2 L. Pneumovax Prevnar are up-to-date.  ROV 04/23/15 -- follow-up visit for COPD. On her initial visit she was treated for an exacerbation with prednisone and antibiotics. She has been managed on Spiriva and oxygen at 2 L/m.  She reports that she has had increased wheeze over the last 3 weeks. Her breathing has been progressively worse over few years, more limiting over the last month. Dry cough. Has occasionally been w sputum. Breo and symbicort did not tolerate.   ROV 06/27/15 -- follow-up visit for COPD. She was prescribed Anoro 04/23/15 and told to stop Spiriva. She was seen by Rexene Edison 05/05/15 for an exacerbation and treated with doxycycline and prednisone taper. Her husband called 06/03/15 because she was having dyspnea and increased dizziness due to frequent albuterol use. She was started on another prednisone taper and continued on 10mg  daily chronically. She reports the Anoro did not work and caused her to have the exacerbation. She feels better overall since the last prednisone taper. She has had decreased exertional dyspnea. Denies cough.  Uses her albuterol every few days when she exerts herself. On 2L/m oxygen.  ROV 09/26/15 -- follow-up visit for COPD. She has most recently been managed with chronic prednisone 10 mg daily. She is also on Spiriva qd, didn't feel that Anoro helped as much. She had been well until the last month, started to have some increased exertional SOB. No real change in mucous production. She has benefited from [facility] therapy, PT, Resp therapy. Her SABA use has improved.   ROV 12/31/15 -- Patient has a history of COPD Currently being managed on Spiriva and chronic prednisone at 10 mg daily. Also with hypoxemic respiratory failure currently managed on 2L/min at all times. She has remained quite limited. Able to walk some through the house but has to stop. She was able to get Pulm rehab through [facility], lasted 6 weeks. She benefited from it. She is doing breathing exercises as well. No extra abx or pred since last time. Her albuterol use may be down slightly. She is concerned about pred side effects - she has gained weight.    Review of Systems  As per HPI.    Past Medical History:  Diagnosis Date  . Abnormal chest x-ray    03/2012: will need OP f/u.  Marland Kitchen Anginal pain ([place])   . ANXIETY 01/01/2007  . BURSITIS, RIGHT HIP 06/04/2009  . CAD (coronary artery disease)    a. BMS to LAD 2010. b. NSTEMI with DES to LAD for ISR 2011. c. Patent stent 03/2012/Imdur added.  . Cataract   . CHEST PAIN-PRECORDIAL  01/15/2009  . Colon polyps    H/o tubular adenoma of colon  . COPD 01/01/2007   a. Chronic resp failure on home O2.  Marland Kitchen DEPRESSION 01/01/2007  . Eczema 01/08/2011  . GROIN PAIN 06/20/2008  . Headache(784.0) 01/01/2007  . Hemorrhoid   . HYPERTENSION 01/01/2007  . Impaired glucose tolerance 01/07/2011  . LOW BACK PAIN 01/01/2007  . Muscle weakness (generalized) 06/04/2009  . OSTEOARTHRITIS, HIP 07/01/2008  . OSTEOPOROSIS 01/01/2007  . Pneumonia 1998  . Rosacea 01/08/2011  . Shortness of breath   . SYNCOPE 01/01/2007  . TIA  (transient ischemic attack)   . TRANSIENT ISCHEMIC ATTACK, HX OF 01/01/2007     Family History  Problem Relation Age of Onset  . Osteoporosis Mother   . Heart disease Sister   . Emphysema Sister   . Seizures Sister     epilepsy  . Cardiomyopathy Sister   . Heart attack Sister      Social History   Social History  . Marital status: Married    Spouse name: N/A  . Number of children: 4  . Years of education: N/A   Occupational History  . disabled back since 2003    Social History Main Topics  . Smoking status: Former Smoker    Packs/day: 1.50    Years: 51.00    Types: Cigarettes    Quit date: 07/24/2005  . Smokeless tobacco: Never Used  . Alcohol use No  . Drug use: No  . Sexual activity: Not on file   Other Topics Concern  . Not on file   Social History Narrative  . No narrative on file     Allergies  Allergen Reactions  . Aspirin Other (See Comments)     cns bleed risk  . Other     Stiloto - severe reaction - caused inability to breath  . Codeine Rash     Outpatient Medications Prior to Visit  Medication Sig Dispense Refill  . albuterol (PROVENTIL HFA;VENTOLIN HFA) 108 (90 Base) MCG/ACT inhaler Inhale 2 puffs into the lungs every 6 (six) hours as needed for wheezing or shortness of breath. 3 Inhaler 3  . ascorbic acid (VITAMIN C) 1000 MG tablet Take 1,000 mg by mouth daily.    . Cholecalciferol (VITAMIN D) 1000 UNITS capsule Take 1,000 Units by mouth every evening.     . clopidogrel (PLAVIX) 75 MG tablet TAKE 1 TABLET (75 MG TOTAL) BY MOUTH DAILY. 30 tablet 11  . docusate sodium (COLACE) 100 MG capsule Take 100 mg by mouth daily as needed. For constipation    . isosorbide mononitrate (IMDUR) 30 MG 24 hr tablet TAKE 0.5 TABLETS (15 MG TOTAL) BY MOUTH DAILY. 45 tablet 3  . lovastatin (MEVACOR) 20 MG tablet TAKE 1 TABLET BY MOUTH AT BEDTIME 90 tablet 3  . metoprolol tartrate (LOPRESSOR) 25 MG tablet TAKE 1/2 TABLET BY MOUTH 2 TIMES A DAY 30 tablet 11  .  nitroGLYCERIN (NITROSTAT) 0.4 MG SL tablet Place 1 tablet (0.4 mg total) under the tongue every 5 (five) minutes as needed for chest pain (up to 3 doses). 25 tablet 4  . nortriptyline (PAMELOR) 10 MG capsule Take 10 mg by mouth at bedtime.     . ondansetron (ZOFRAN) 4 MG tablet Take 4 mg by mouth 3 (three) times daily as needed. For nausea (take with tramadol)    . polyvinyl alcohol (LIQUIFILM TEARS) 1.4 % ophthalmic solution Place 1 drop into both eyes daily.    . predniSONE (DELTASONE)  10 MG tablet TAKE 1 TABLET (10 MG TOTAL) BY MOUTH DAILY WITH BREAKFAST. 30 tablet 5  . SPIRIVA HANDIHALER 18 MCG inhalation capsule INHALE 1 CAPSULE VIA HANDIHALER ONCE DAILY AT THE SAME TIME EVERY DAY 90 capsule 1  . traMADol (ULTRAM) 50 MG tablet Take 100 mg by mouth 3 (three) times daily as needed. For pain    . triamcinolone cream (KENALOG) 0.1 % Apply 1 application topically 2 (two) times daily. 30 g 0  . Cyanocobalamin (VITAMIN B12 PO) Take 1,000 mg by mouth daily.     No facility-administered medications prior to visit.    Objective:   Physical Exam Vitals:   12/31/15 1405  BP: 110/62  Pulse: 83  SpO2: 94%  Weight: 148 lb (67.1 kg)  Height: 5\' 6"  (1.676 m)   Gen: Pleasant, thin elderly woman, in no distress,  normal affect, no distress on 2 L/m  ENT: No lesions,  mouth clear,  oropharynx clear, no postnasal drip  Neck: No JVD, no stridor  Lungs: No use of accessory muscles,  B soft exp wheezes.   Cardiovascular: RRR, heart sounds normal, no murmur or gallops, no peripheral edema  Musculoskeletal: No deformities, no cyanosis or clubbing  Neuro: alert, non focal  Skin: Warm, no lesions or rashes   Assessment & Plan:  COPD (chronic obstructive pulmonary disease) with emphysema Please continue your spiriva and albuterol  Continue oxygen at 2l/min We will decrease prednisone to 8mg  daily for 3 weeks. If tolerated you can decreased by 1mg  every 3 weeks down to 5mg  daily. If you achieve  this, stay on 5mg  daily until we follow up.  Get the flu shot this Fall.  We will write a script for [facility] to provide a wheelchair Follow with Dr Lamonte Sakai in 3 months or sooner if you have any problems.   Chronic respiratory failure O2 at 2 L/m  Baltazar Apo, MD, PhD 12/31/2015, 2:48 PM [facility] [phone] or if no answer [phone]

## 2015-12-31 NOTE — Addendum Note (Signed)
Addended by: Desmond Dike C on: 12/31/2015 02:56 PM   Modules accepted: Orders

## 2016-01-02 ENCOUNTER — Telehealth: Payer: Self-pay | Admitting: Emergency Medicine

## 2016-01-02 DIAGNOSIS — M961 Postlaminectomy syndrome, not elsewhere classified: Secondary | ICD-10-CM | POA: Diagnosis not present

## 2016-01-02 DIAGNOSIS — G894 Chronic pain syndrome: Secondary | ICD-10-CM | POA: Diagnosis not present

## 2016-01-02 DIAGNOSIS — Z79891 Long term (current) use of opiate analgesic: Secondary | ICD-10-CM | POA: Diagnosis not present

## 2016-01-02 DIAGNOSIS — G4700 Insomnia, unspecified: Secondary | ICD-10-CM | POA: Diagnosis not present

## 2016-01-02 DIAGNOSIS — M47817 Spondylosis without myelopathy or radiculopathy, lumbosacral region: Secondary | ICD-10-CM | POA: Diagnosis not present

## 2016-01-02 NOTE — Telephone Encounter (Signed)
These forms have been received and placed in RB sign folder.  Will forward to LL to follow up on.

## 2016-01-02 NOTE — Telephone Encounter (Signed)
This form has been placed in RB's sign folder and the folder has been given to him. Will await the return of this form to me.

## 2016-01-05 ENCOUNTER — Telehealth: Payer: Self-pay | Admitting: Emergency Medicine

## 2016-01-05 NOTE — Telephone Encounter (Signed)
Per 12/30/15 OV: Instructions  Please continue your spiriva and albuterol  Continue oxygen at 2l/min We will decrease prednisone to 8mg  daily for 3 weeks. If tolerated you can decreased by 1mg  every 3 weeks down to 5mg  daily. If you achieve this, stay on 5mg  daily until we follow up.  Get the flu shot this Fall.  We will write a script for [facility] to provide a wheelchair Follow with Dr Lamonte Sakai in 3 months or sooner if you have any problems  --  Spoke with spouse. She reports pt was on prednisone 10 mg daily for several months. Recently RB cut down the prednisone and pt now taking 8 mg now for 5 days. Pt reports this morning when she woke up she had an episode of "not able to breathe and felt a thump in her chest", after this happened she had pain in her chest into her back.  Pt thinks it is because she is cutting back on prednisone. Please advise RB thanks

## 2016-01-05 NOTE — Telephone Encounter (Signed)
Form has been signed and returned to me. This has been faxed back to [facility]. Nothing further was needed.

## 2016-01-05 NOTE — Telephone Encounter (Signed)
Difficult to say whether this is related to the medication change, but if she is having more dyspnea and CP then she needs to be evaluated in ED - many things can cause this!  It is OK with me for her to go back to 10mg  daily to see if she feels better at the higher dose.

## 2016-01-05 NOTE — Telephone Encounter (Signed)
Called and spoke with pt and her husband and she stated that she will try to increase the prednisone back to 10 mg daily to see if that helps.  She stated that if her pain comes back or gets worse she will go to the ER.

## 2016-01-06 ENCOUNTER — Emergency Department (HOSPITAL_COMMUNITY): Payer: Medicare Other

## 2016-01-06 ENCOUNTER — Emergency Department (HOSPITAL_COMMUNITY)
Admission: EM | Admit: 2016-01-06 | Discharge: 2016-01-06 | Disposition: A | Discharge status: Home / Self Care | Payer: Medicare Other | Attending: Physician Assistant | Admitting: Physician Assistant

## 2016-01-06 ENCOUNTER — Encounter (HOSPITAL_COMMUNITY): Payer: Self-pay | Admitting: Emergency Medicine

## 2016-01-06 DIAGNOSIS — Y999 Unspecified external cause status: Secondary | ICD-10-CM | POA: Diagnosis not present

## 2016-01-06 DIAGNOSIS — R079 Chest pain, unspecified: Secondary | ICD-10-CM | POA: Diagnosis not present

## 2016-01-06 DIAGNOSIS — J449 Chronic obstructive pulmonary disease, unspecified: Secondary | ICD-10-CM | POA: Diagnosis not present

## 2016-01-06 DIAGNOSIS — S22009A Unspecified fracture of unspecified thoracic vertebra, initial encounter for closed fracture: Secondary | ICD-10-CM | POA: Diagnosis not present

## 2016-01-06 DIAGNOSIS — Z7902 Long term (current) use of antithrombotics/antiplatelets: Secondary | ICD-10-CM | POA: Diagnosis not present

## 2016-01-06 DIAGNOSIS — Y939 Activity, unspecified: Secondary | ICD-10-CM | POA: Diagnosis not present

## 2016-01-06 DIAGNOSIS — Z955 Presence of coronary angioplasty implant and graft: Secondary | ICD-10-CM | POA: Diagnosis not present

## 2016-01-06 DIAGNOSIS — I2510 Atherosclerotic heart disease of native coronary artery without angina pectoris: Secondary | ICD-10-CM | POA: Insufficient documentation

## 2016-01-06 DIAGNOSIS — Z8673 Personal history of transient ischemic attack (TIA), and cerebral infarction without residual deficits: Secondary | ICD-10-CM | POA: Insufficient documentation

## 2016-01-06 DIAGNOSIS — I10 Essential (primary) hypertension: Secondary | ICD-10-CM | POA: Diagnosis not present

## 2016-01-06 DIAGNOSIS — Y929 Unspecified place or not applicable: Secondary | ICD-10-CM | POA: Diagnosis not present

## 2016-01-06 DIAGNOSIS — R0602 Shortness of breath: Secondary | ICD-10-CM | POA: Diagnosis not present

## 2016-01-06 DIAGNOSIS — S22050A Wedge compression fracture of T5-T6 vertebra, initial encounter for closed fracture: Secondary | ICD-10-CM | POA: Diagnosis not present

## 2016-01-06 DIAGNOSIS — Z87891 Personal history of nicotine dependence: Secondary | ICD-10-CM | POA: Insufficient documentation

## 2016-01-06 DIAGNOSIS — X58XXXA Exposure to other specified factors, initial encounter: Secondary | ICD-10-CM | POA: Diagnosis not present

## 2016-01-06 DIAGNOSIS — M545 Low back pain: Secondary | ICD-10-CM | POA: Diagnosis not present

## 2016-01-06 DIAGNOSIS — R0789 Other chest pain: Secondary | ICD-10-CM | POA: Diagnosis not present

## 2016-01-06 DIAGNOSIS — S299XXA Unspecified injury of thorax, initial encounter: Secondary | ICD-10-CM | POA: Diagnosis present

## 2016-01-06 DIAGNOSIS — S22059A Unspecified fracture of T5-T6 vertebra, initial encounter for closed fracture: Secondary | ICD-10-CM | POA: Diagnosis not present

## 2016-01-06 LAB — COMPREHENSIVE METABOLIC PANEL
ALBUMIN: 3.4 g/dL — AB (ref 3.5–5.0)
ALK PHOS: 57 U/L (ref 38–126)
ALT: 16 U/L (ref 14–54)
ANION GAP: 9 (ref 5–15)
AST: 18 U/L (ref 15–41)
BUN: 7 mg/dL (ref 6–20)
CALCIUM: 9.2 mg/dL (ref 8.9–10.3)
CO2: 31 mmol/L (ref 22–32)
Chloride: 102 mmol/L (ref 101–111)
Creatinine, Ser: 0.85 mg/dL (ref 0.44–1.00)
GFR calc non Af Amer: >60 mL/min (ref >60)
GLUCOSE: 95 mg/dL (ref 65–99)
POTASSIUM: 3.3 mmol/L — AB (ref 3.5–5.1)
SODIUM: 142 mmol/L (ref 135–145)
TOTAL PROTEIN: 5.7 g/dL — AB (ref 6.5–8.1)
Total Bilirubin: 0.4 mg/dL (ref 0.3–1.2)

## 2016-01-06 LAB — I-STAT TROPONIN, ED
TROPONIN I, POC: 0.02 ng/mL (ref 0.00–0.08)
Troponin i, poc: 0.01 ng/mL (ref 0.00–0.08)

## 2016-01-06 LAB — CBC WITH DIFFERENTIAL/PLATELET
BASOS ABS: 0 10*3/uL (ref 0.0–0.1)
BASOS PCT: 0 %
Eosinophils Absolute: 0.2 10*3/uL (ref 0.0–0.7)
Eosinophils Relative: 2 %
HEMATOCRIT: 41.1 % (ref 36.0–46.0)
HEMOGLOBIN: 13 g/dL (ref 12.0–15.0)
LYMPHS PCT: 26 %
Lymphs Abs: 3.1 10*3/uL (ref 0.7–4.0)
MCH: 29 pg (ref 26.0–34.0)
MCHC: 31.6 g/dL (ref 30.0–36.0)
MCV: 91.7 fL (ref 78.0–100.0)
MONO ABS: 0.9 10*3/uL (ref 0.1–1.0)
MONOS PCT: 8 %
NEUTROS ABS: 7.7 10*3/uL (ref 1.7–7.7)
NEUTROS PCT: 64 %
Platelets: 228 10*3/uL (ref 150–400)
RBC: 4.48 MIL/uL (ref 3.87–5.11)
RDW: 12.9 % (ref 11.5–15.5)
WBC: 12 10*3/uL — ABNORMAL HIGH (ref 4.0–10.5)

## 2016-01-06 MED ORDER — ONDANSETRON HCL 4 MG PO TABS
4.0000 mg | ORAL_TABLET | Freq: Once | ORAL | Status: AC
  Administered 2016-01-06: 4 mg via ORAL
  Filled 2016-01-06: qty 1

## 2016-01-06 MED ORDER — TRAMADOL HCL 50 MG PO TABS
100.0000 mg | ORAL_TABLET | Freq: Once | ORAL | Status: AC
  Administered 2016-01-06: 100 mg via ORAL
  Filled 2016-01-06: qty 2

## 2016-01-06 MED ORDER — ONDANSETRON HCL 4 MG PO TABS: 4.0000 mg | ORAL_TABLET | Freq: Three times a day (TID) | ORAL | 0 refills | Status: DC | PRN

## 2016-01-06 MED ORDER — SODIUM CHLORIDE 0.9 % IV BOLUS (SEPSIS)
500.0000 mL | Freq: Once | INTRAVENOUS | Status: AC
  Administered 2016-01-06: 500 mL via INTRAVENOUS

## 2016-01-06 MED ORDER — OXYCODONE-ACETAMINOPHEN 5-325 MG PO TABS
1.0000 | ORAL_TABLET | Freq: Once | ORAL | Status: AC
  Administered 2016-01-06: 1 via ORAL
  Filled 2016-01-06: qty 1

## 2016-01-06 MED ORDER — OXYCODONE-ACETAMINOPHEN 5-325 MG PO TABS: 1.0000 | ORAL_TABLET | Freq: Four times a day (QID) | ORAL | 0 refills | Status: DC | PRN

## 2016-01-06 MED ORDER — IOPAMIDOL (ISOVUE-370) INJECTION 76%
INTRAVENOUS | Status: AC
  Administered 2016-01-06: 100 mL via INTRAVENOUS
  Filled 2016-01-06: qty 100

## 2016-01-06 MED ORDER — GADOBENATE DIMEGLUMINE 529 MG/ML IV SOLN
15.0000 mL | Freq: Once | INTRAVENOUS | Status: AC | PRN
  Administered 2016-01-06: 15 mL via INTRAVENOUS

## 2016-01-06 MED ORDER — POTASSIUM CHLORIDE 10 MEQ/100ML IV SOLN
10.0000 meq | Freq: Once | INTRAVENOUS | Status: AC
  Administered 2016-01-06: 10 meq via INTRAVENOUS
  Filled 2016-01-06: qty 100

## 2016-01-06 NOTE — ED Notes (Signed)
Pt returned from radiology, placed back on heart monitor

## 2016-01-06 NOTE — ED Notes (Signed)
Returned from [employer], er md notified and placed back on monitor

## 2016-01-06 NOTE — ED Notes (Signed)
MD at bedside. 

## 2016-01-06 NOTE — ED Notes (Signed)
Pt returned from xray, and placed back on monitor

## 2016-01-06 NOTE — ED Provider Notes (Addendum)
[facility] DEPT Provider Note   CSN: [id number] Arrival date & time: 01/06/16  1022     History   Chief Complaint Chief Complaint  Patient presents with  . Chest Pain  . Back Pain    HPI Alexis Higgins is a 75 y.o. female.  The history is provided by the patient and the spouse.  Chest Pain   This is a new problem. The current episode started yesterday. The problem occurs hourly. The problem has been gradually worsening. The pain is associated with exertion (position, exertion, walking). The pain is present in the substernal region. The pain is at a severity of 4/10. The pain is mild. The quality of the pain is described as exertional, sharp and pleuritic. The pain radiates to the mid back and upper back. The symptoms are aggravated by deep breathing and exertion. Associated symptoms include back pain, nausea and shortness of breath. Pertinent negatives include no abdominal pain, no hemoptysis, no near-syncope, no numbness, no palpitations, no syncope and no weakness. She has tried rest for the symptoms. The treatment provided mild relief. Risk factors include being elderly, sedentary lifestyle and smoking/tobacco exposure.  Her past medical history is significant for CAD and COPD.  Her family medical history is significant for CAD.  Procedure history is positive for cardiac catheterization.  Back Pain   Associated symptoms include chest pain. Pertinent negatives include no numbness, no abdominal pain and no weakness.     Pt is a 75 yo with COPD on home 2l, anxiety, depression, CAD with history of DES in 2011, htn, presenting with chest pain.   Past Medical History:  Diagnosis Date  . Abnormal chest x-ray    03/2012: will need OP f/u.  Marland Kitchen Anginal pain ([place])   . ANXIETY 01/01/2007  . BURSITIS, RIGHT HIP 06/04/2009  . CAD (coronary artery disease)    a. BMS to LAD 2010. b. NSTEMI with DES to LAD for ISR 2011. c. Patent stent 03/2012/Imdur added.  . Cataract   . CHEST  PAIN-PRECORDIAL 01/15/2009  . Colon polyps    H/o tubular adenoma of colon  . COPD 01/01/2007   a. Chronic resp failure on home O2.  Marland Kitchen DEPRESSION 01/01/2007  . Eczema 01/08/2011  . GROIN PAIN 06/20/2008  . Headache(784.0) 01/01/2007  . Hemorrhoid   . HYPERTENSION 01/01/2007  . Impaired glucose tolerance 01/07/2011  . LOW BACK PAIN 01/01/2007  . Muscle weakness (generalized) 06/04/2009  . OSTEOARTHRITIS, HIP 07/01/2008  . OSTEOPOROSIS 01/01/2007  . Pneumonia 1998  . Rosacea 01/08/2011  . Shortness of breath   . SYNCOPE 01/01/2007  . TIA (transient ischemic attack)   . TRANSIENT ISCHEMIC ATTACK, HX OF 01/01/2007    Patient Active Problem List   Diagnosis Date Noted  . Smoker 08/02/2014  . Peripheral vascular disease ([place]) 08/02/2014  . Abdominal pain, LLQ 11/22/2013  . Black stools 11/22/2013  . Heme positive stool 11/22/2013  . Acute blood loss anemia 10/24/2013  . Displaced fracture of left femoral neck ([place]) 09/30/2013  . Fracture of femoral neck, left, closed 09/30/2013  . Fall at home 09/30/2013  . Head contusion 09/30/2013  . Leg weakness, bilateral 07/21/2012  . 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 Unstable angina pectoris ([place]) 04/12/2012  . Chronic respiratory failure ([place]) 02/13/2012  . Localized swelling, mass and lump, neck 01/06/2012  . Dyspnea 11/22/2011  . Personal history of colonic polyps 09/06/2011  . Eczema 01/08/2011  . Rosacea 01/08/2011  . Dizziness - light-headed 01/08/2011  . Encounter  for long-term (current) use of high-risk medication 01/08/2011  . Impaired glucose tolerance 01/07/2011  . Preventative health care 01/07/2011  . BURSITIS, RIGHT HIP 06/04/2009  . CAD, NATIVE VESSEL 01/15/2009  . Other symptoms involving cardiovascular system 01/15/2009  . CHEST PAIN-PRECORDIAL 01/15/2009  . OSTEOARTHRITIS, HIP 07/01/2008  . Hyperlipidemia 01/01/2007  . ANXIETY 01/01/2007  . DEPRESSION 01/01/2007  . Essential hypertension 01/01/2007  . COPD (chronic obstructive  pulmonary disease) with emphysema ([place]) 01/01/2007  . LOW BACK PAIN 01/01/2007  . OSTEOPOROSIS 01/01/2007  . SYNCOPE 01/01/2007  . Headache(784.0) 01/01/2007  . TRANSIENT ISCHEMIC ATTACK, HX OF 01/01/2007    Past Surgical History:  Procedure Laterality Date  . ABDOMINAL HYSTERECTOMY    . APPENDECTOMY    . BACK SURGERY    . BREAST ENHANCEMENT SURGERY    . COLONOSCOPY  01/25/2002   tubular adenoma,hemorrhoids, hyperplastic  colon polyps  . COLONOSCOPY  02/17/2005   hemorrhoids  . CORONARY ANGIOPLASTY    . CORONARY STENT PLACEMENT    . HIP ARTHROPLASTY Left 10/01/2013   Procedure: ARTHROPLASTY UNIPOLAR   HIP;  Surgeon: Marianna Payment, MD;  Location: [facility];  Service: Orthopedics;  Laterality: Left;  . HIP SURGERY Left    DR XU     PROXIMAL NECK   . LEFT HEART CATHETERIZATION WITH CORONARY ANGIOGRAM N/A 04/13/2012   Procedure: LEFT HEART CATHETERIZATION WITH CORONARY ANGIOGRAM;  Surgeon: Burnell Blanks, MD;  Location: [facility] CATH LAB;  Service: Cardiovascular;  Laterality: N/A;  . OOPHORECTOMY     one ovary  . s/p bilat cataract  2010  . sp lumbar disc surgury     [name]    OB History    No data available       Home Medications    Prior to Admission medications   Medication Sig Start Date End Date Taking? Authorizing Provider  albuterol (PROVENTIL HFA;VENTOLIN HFA) 108 (90 Base) MCG/ACT inhaler Inhale 2 puffs into the lungs every 6 (six) hours as needed for wheezing or shortness of breath. 05/29/15  Yes Biagio Borg, MD  ascorbic acid (VITAMIN C) 1000 MG tablet Take 1,000 mg by mouth daily.   Yes Historical Provider, MD  Cholecalciferol (VITAMIN D) 1000 UNITS capsule Take 1,000 Units by mouth every evening.    Yes Historical Provider, MD  clopidogrel (PLAVIX) 75 MG tablet TAKE 1 TABLET (75 MG TOTAL) BY MOUTH DAILY. 12/11/15  Yes Burnell Blanks, MD  docusate sodium (COLACE) 100 MG capsule Take 100 mg by mouth daily as needed. For constipation   Yes Historical  Provider, MD  isosorbide mononitrate (IMDUR) 30 MG 24 hr tablet TAKE 0.5 TABLETS (15 MG TOTAL) BY MOUTH DAILY. 12/11/15  Yes Burnell Blanks, MD  lovastatin (MEVACOR) 20 MG tablet TAKE 1 TABLET BY MOUTH AT BEDTIME Patient taking differently: TAKE 20 MG BY MOUTH AT BEDTIME 03/24/15  Yes Biagio Borg, MD  metoprolol tartrate (LOPRESSOR) 25 MG tablet TAKE 1/2 TABLET BY MOUTH 2 TIMES A DAY Patient taking differently: Take 12.5 mg by mouth 2 (two) times daily.  12/11/15  Yes Burnell Blanks, MD  nitroGLYCERIN (NITROSTAT) 0.4 MG SL tablet Place 1 tablet (0.4 mg total) under the tongue every 5 (five) minutes as needed for chest pain (up to 3 doses). 04/13/12  Yes Dayna N Dunn, PA-C  nortriptyline (PAMELOR) 10 MG capsule Take 20 mg by mouth at bedtime.  07/24/13  Yes Historical Provider, MD  ondansetron (ZOFRAN) 4 MG tablet Take 4 mg by  mouth 2 (two) times daily as needed for nausea or vomiting. For nausea (take with tramadol) 06/21/11  Yes Historical Provider, MD  polyethylene glycol (MIRALAX / GLYCOLAX) packet Take 17 g by mouth daily as needed for mild constipation.   Yes Historical Provider, MD  polyvinyl alcohol (LIQUIFILM TEARS) 1.4 % ophthalmic solution Place 1 drop into both eyes daily.   Yes Historical Provider, MD  predniSONE (DELTASONE) 10 MG tablet TAKE 1 TABLET (10 MG TOTAL) BY MOUTH DAILY WITH BREAKFAST. 12/11/15  Yes Collene Gobble, MD  SPIRIVA HANDIHALER 18 MCG inhalation capsule INHALE 1 CAPSULE VIA HANDIHALER ONCE DAILY AT THE SAME TIME EVERY DAY 09/29/15  Yes Biagio Borg, MD  traMADol (ULTRAM) 50 MG tablet Take 100 mg by mouth every 6 (six) hours as needed for moderate pain.    Yes Historical Provider, MD  predniSONE (DELTASONE) 1 MG tablet Take as directed per Dr. Agustina Caroli instructions Patient not taking: Reported on 01/06/2016 12/31/15   Collene Gobble, MD  triamcinolone cream (KENALOG) 0.1 % Apply 1 application topically 2 (two) times daily. Patient not taking: Reported on 01/06/2016  11/20/14   Biagio Borg, MD    Family History Family History  Problem Relation Age of Onset  . Osteoporosis Mother   . Heart disease Sister   . Emphysema Sister   . Seizures Sister     epilepsy  . Cardiomyopathy Sister   . Heart attack Sister     Social History Social History  Substance Use Topics  . Smoking status: Former Smoker    Packs/day: 1.50    Years: 51.00    Types: Cigarettes    Quit date: 07/24/2005  . Smokeless tobacco: Never Used  . Alcohol use No     Allergies   Aspirin; Other; and Codeine   Review of Systems Review of Systems  Constitutional: Negative for activity change.  Respiratory: Positive for chest tightness and shortness of breath. Negative for hemoptysis.   Cardiovascular: Positive for chest pain. Negative for palpitations, syncope and near-syncope.  Gastrointestinal: Positive for nausea. Negative for abdominal pain.  Musculoskeletal: Positive for back pain.  Neurological: Negative for weakness and numbness.  All other systems reviewed and are negative.    Physical Exam Updated Vital Signs BP 130/75   Pulse 100   Temp 98.6 F (37 C) (Oral)   Resp 14   SpO2 100%   Physical Exam  Constitutional: She is oriented to person, place, and time. She appears well-developed and well-nourished.  HENT:  Head: Normocephalic and atraumatic.  Eyes: Right eye exhibits no discharge.  Cardiovascular: Normal rate, regular rhythm and normal heart sounds.   No murmur heard. Pulmonary/Chest: Effort normal and breath sounds normal. She has no wheezes. She has no rales.  On home 2 L, sating 95%  Abdominal: Soft. She exhibits no distension. There is no tenderness.  Neurological: She is oriented to person, place, and time.  Skin: Skin is warm and dry. She is not diaphoretic.  Psychiatric: She has a normal mood and affect.  Nursing note and vitals reviewed.    ED Treatments / Results  Labs (all labs ordered are listed, but only abnormal results are  displayed) Labs Reviewed  COMPREHENSIVE METABOLIC PANEL - Abnormal; Notable for the following:       Result Value   Potassium 3.3 (*)    Total Protein 5.7 (*)    Albumin 3.4 (*)    All other components within normal limits  CBC WITH DIFFERENTIAL/PLATELET - Abnormal;  Notable for the following:    WBC 12.0 (*)    All other components within normal limits  I-STAT TROPOININ, ED    EKG  EKG Interpretation  Date/Time:  Tuesday January 06 2016 10:45:40 EDT Ventricular Rate:  101 PR Interval:    QRS Duration: 91 QT Interval:  354 QTC Calculation: 459 R Axis:   73 Text Interpretation:  Sinus tachycardia Anteroseptal infarct, old Minimal ST depression, diffuse leads No significant change since last tracing Confirmed by Gerald Leitz ([id number]) on 01/06/2016 12:07:41 PM       Radiology Dg Chest 2 View  Result Date: 01/06/2016 CLINICAL DATA:  Increasing shortness of Breath EXAM: CHEST  2 VIEW COMPARISON:  10/10/2015 FINDINGS: Cardiac shadow is within normal limits. The lungs are hyperinflated bilaterally. No focal infiltrate or sizable effusion is seen. Mild interstitial thickening is noted stable from prior exam. Bilateral breast implants are seen. No acute bony abnormality is noted. IMPRESSION: COPD without acute abnormality. Electronically Signed   By: Inez Catalina M.D.   On: 01/06/2016 11:57   Ct Angio Chest/abd/pel For Dissection W And/or W/wo  Result Date: 01/06/2016 CLINICAL DATA:  Upper back pain radiating to the chest for 2 days. EXAM: CT ANGIOGRAPHY CHEST, ABDOMEN AND PELVIS TECHNIQUE: Multidetector CT imaging through the chest, abdomen and pelvis was performed using the standard protocol during bolus administration of intravenous contrast. Multiplanar reconstructed images and MIPs were obtained and reviewed to evaluate the vascular anatomy. CONTRAST:  100 cc Isovue 370 COMPARISON:  10/10/2015 FINDINGS: CTA CHEST FINDINGS There is no evidence of aortic aneurysm, dissection, or  intramural hematoma. Maximal diameters at the sinus of Valsalva, sino-tubular junction, ascending aorta, aortic arch, and proximal descending aorta are 2.9 cm, 2.4 cm, 2.9 cm, 2.6 cm, and 2.7 cm. Great vessels are patent within the confines of the examination. Vertebral arteries are also patent. Mild atherosclerotic calcifications involving the descending thoracic aorta. Dense LAD territory coronary artery calcifications. Minimal right coronary artery calcification. Minimal peripheral aortic valve leaflet calcification. No abnormal mediastinal adenopathy. Stable rib right thyroid nodule. No hilar adenopathy. No pneumothorax or pleural effusion. Extensive centrilobular emphysema with a prep collection towards the lung apices is present. Pleural based nodule in the right lower lobe on image 66 measures 3 mm and is stable. Minimal dependent atelectasis in the lungs. No definite acute rib fracture. Minimal T5 inferior endplate fracture. This has an acute appearance. There is 10-20% loss of height anteriorly. Review of the MIP images confirms the above findings. CTA ABDOMEN AND PELVIS FINDINGS No evidence of aortic aneurysm or dissection. Scattered atherosclerotic calcifications of the aorta are noted. Minimal narrowing in the proximal celiac axis. Branch vessels patent. SMA is patent Right renal artery is patent. There is severe narrowing at the origin of the left renal artery. IMA is patent Bilateral common iliac, external iliac, and internal iliac arteries are patent. Liver, gallbladder, spleen, adrenal glands, pancreas are within normal limits. Mild chronic changes of the kidneys without mass or hydronephrosis Moderate stool burden throughout the colon. No disproportionate dilatation of a small are large bowel to suggest obstruction. No focal mass of the colon. Appendix is nonvisualized. Lipomata cm filter a shin of the ileocecal valve Uterus is absent. Adnexa are unremarkable. Bladder is within normal limits. Left  hip hemi arthroplasty. Osteopenia. No lumbar compression deformity. Degenerative disc disease is present. It is most severe at L4-5 with vacuum phenomenon. Review of the MIP images confirms the above findings. IMPRESSION: No aortic dissection or aneurysm. No acute  vascular pathology in the thorax. New T5 compression deformity as described. MRI can be performed to assess for the amount of edema. This could account for the patient's symptoms. Emphysema. Stable right thyroid nodule. Stable tiny right lower lobe pulmonary nodule. No evidence of aortic aneurysm or dissection in the abdomen. Severe narrowing at the origin of the left renal artery. Correlate with a history of uncontrolled hypertension. Degenerative disc disease in the lumbar spine. Electronically Signed   By: Marybelle Killings M.D.   On: 01/06/2016 13:52    Procedures Procedures (including critical care time)  Medications Ordered in ED Medications  traMADol (ULTRAM) tablet 100 mg (100 mg Oral Given 01/06/16 1205)  ondansetron (ZOFRAN) tablet 4 mg (4 mg Oral Given 01/06/16 1205)  iopamidol (ISOVUE-370) 76 % injection (100 mLs Intravenous Contrast Given 01/06/16 1310)     Initial Impression / Assessment and Plan / ED Course  I have reviewed the triage vital signs and the nursing notes.  Pertinent labs & imaging results that were available during my care of the patient were reviewed by me and considered in my medical decision making (see chart for details).  Clinical Course      Final Clinical Impressions(s) / ED Diagnoses   Final diagnoses:  Chest pain   Pt is a 76 yo with COPD on home 2l, anxiety, depression, CAD with history of DES in 2011, htn, presenting with chest pain. Pt has recently been decreasing prednisone from 10 mg to 8 mg, Chest pain started one week after decreasing prednisone, but she has had no increase in SOB.  Patietn has had a coupel of events in the last day where she exerted herself, had SOB, nausea, and chest pain  that radiated to her back.  Because of the radiation to the back and her mutlipel risk facotrs, will get dissection study.  The CP could be secondary to nincreasesed inflammation secondary to decreasing prednisone, but concerned given patient's cardiac history. Expecially concerning given the exertional component.   Elevated heart score.  2:53 PM CAT scan shows spinal fracture. I went approached patient and family with this information. Patient said this is where the pain was coming from likely. She does not think she had chest pain in additional to this pain. She thinks that the back pain was actually radiating to the front. We'll do a second troponin now. We'll get MRI of T spine     New Prescriptions New Prescriptions   No medications on file     [place] Crance Julio Alm, MD 01/06/16 [place], MD 01/06/16 1601

## 2016-01-06 NOTE — ED Notes (Signed)
Awaiting for mri prior to discharge

## 2016-01-06 NOTE — ED Provider Notes (Signed)
I received pt in signout from Dr. Thomasene Lot. We were awaiting MRI results of T-spine. MRI showed subacute T9 fx with no malalignment or other acute problems. Patient was sitting up in bed, eating crackers, and well-appearing on reexamination. Gave a dose of Percocet prior to discharge but explained I would not be able to provide a narcotic course as she already receives tramadol from a pain clinic. Instructed to contact pain clinic in the morning for further instructions. Patient voiced understanding and was discharged in satisfactory condition.   Sharlett Iles, MD 01/06/16 [phone]

## 2016-01-06 NOTE — Discharge Instructions (Signed)
Please follow upw with the surgeon provided. Please use percocet to help with pain (becareful since it can make you dizzy)

## 2016-01-06 NOTE — ED Notes (Signed)
Mri contacted about results and family updated

## 2016-01-06 NOTE — ED Triage Notes (Addendum)
Cp and upper thoraric pain since yesterday sharp in nature has hx of stent. Pt on home o2 at 2  For copd has been trying to taper pred and she feels this is the cause of her issues, pt given 3 nitro per ems, pain is better she states. Has iv per ems

## 2016-01-08 ENCOUNTER — Telehealth: Payer: Self-pay | Admitting: Emergency Medicine

## 2016-01-08 NOTE — Telephone Encounter (Signed)
She cannot stop the prednisone altogether. It will need to be tapered as we had originally discussed at [place]. Have her take 5mg  for 2 weeks, then 2.5mg  daily for 2 weeks and then stop

## 2016-01-08 NOTE — Telephone Encounter (Signed)
Spoke with pt's husband. States that pt was diagnosed with a compressed spinal fracture. They were told that prednisone was the cause of this. Pt wants to try to taper off of this medication. She is currently taking 8mg  daily. Pt has not taken her dose of prednisone, they want RB's recommendation before she takes it.  RB - please advise. Thanks.

## 2016-01-08 NOTE — Telephone Encounter (Signed)
Spoke with pt and is aware of recs below. She wrote the directions down. Nothing further needed

## 2016-01-12 ENCOUNTER — Telehealth: Payer: Self-pay | Admitting: Emergency Medicine

## 2016-01-12 NOTE — Telephone Encounter (Signed)
It sounds to me like her symptoms are severe enough that she needs to be evaluated soon as possible. She needs to be seen here tomorrow or to go to the emergency room for possible admission to the hospital. Difficult for me to no whether to increase prednisone or to decrease it in this situation. I think she needs to be seen

## 2016-01-12 NOTE — Telephone Encounter (Signed)
Called and spoke with pt and her husband and she wanted to see RB.  appt scheduled for 1015 tomorrow with RB, but pt is aware that if she was to get worse that she will need to go to the ER for further eval.  Pt voiced her understanding

## 2016-01-12 NOTE — Telephone Encounter (Signed)
Spoke with pt's husband (dpr on file),  c/o increased fatigue, weakness, SOB, occasional nonprod cough.  Denies mucus production, fever.    States that pt uses a wheelchair to get around in the office, and she is unable to move from the chair to the bed/couch/restroom without immense SOB/fatigue.  Pt's husband also notes that pt has a spinal compression fracture.  Pt is currently weaning off of prednisone, is taking 5mg  qd X2 weeks, then dropping to 2.5mg  X2 wks.    Pt and spouse are requesting recs- pt's spouse states "they can't do this anymore, they have got to get some help".    Pt uses [facility] on [address].    RB please advise on recs.  Thanks.

## 2016-01-13 ENCOUNTER — Encounter: Payer: Self-pay | Admitting: Emergency Medicine

## 2016-01-13 ENCOUNTER — Ambulatory Visit (INDEPENDENT_AMBULATORY_CARE_PROVIDER_SITE_OTHER): Payer: Medicare Other | Admitting: Emergency Medicine

## 2016-01-13 VITALS — BP 132/86 | HR 82 | Wt 148.0 lb

## 2016-01-13 DIAGNOSIS — J439 Emphysema, unspecified: Secondary | ICD-10-CM | POA: Diagnosis not present

## 2016-01-13 NOTE — Progress Notes (Signed)
Subjective:    Patient ID: Alexis Higgins, female    DOB: 06-14-1940, 75 y.o.   MRN: [id number]  HPI 75 year old woman, former smoker (75 pack years) with a history of CAD, hypertension, cerebrovascular disease and COPD that has been followed by Dr Gwenette Greet.    PFT performed in August 2014 that I personally reviewed. These showed very severe obstruction with an FEV1 of 0.69 L or 32% of predicted, and a positive bronchodilator response.    03/07/15 Follow up :  COPD  Patient returns for a two-week follow-up. Patient was seen 2 weeks ago for COPD exacerbation She was treated with a prednisone taper. She says that she is feeling better. Her breathing is returned back to baseline Cough and shortness of breath have decreased. He denies any fever, chest pain, orthopnea, PND, or increased leg swelling She remains on Spiriva daily She is on oxygen at 2 L. Pneumovax Prevnar are up-to-date.  ROV 04/23/15 -- follow-up visit for COPD. On her initial visit she was treated for an exacerbation with prednisone and antibiotics. She has been managed on Spiriva and oxygen at 2 L/m.  She reports that she has had increased wheeze over the last 3 weeks. Her breathing has been progressively worse over few years, more limiting over the last month. Dry cough. Has occasionally been w sputum. Breo and symbicort did not tolerate.   ROV 06/27/15 -- follow-up visit for COPD. She was prescribed Anoro 04/23/15 and told to stop Spiriva. She was seen by Rexene Edison 05/05/15 for an exacerbation and treated with doxycycline and prednisone taper. Her husband called 06/03/15 because she was having dyspnea and increased dizziness due to frequent albuterol use. She was started on another prednisone taper and continued on 10mg  daily chronically. She reports the Anoro did not work and caused her to have the exacerbation. She feels better overall since the last prednisone taper. She has had decreased exertional dyspnea. Denies cough.  Uses her albuterol every few days when she exerts herself. On 2L/m oxygen.  ROV 09/26/15 -- follow-up visit for COPD. She has most recently been managed with chronic prednisone 10 mg daily. She is also on Spiriva qd, didn't feel that Anoro helped as much. She had been well until the last month, started to have some increased exertional SOB. No real change in mucous production. She has benefited from [facility] therapy, PT, Resp therapy. Her SABA use has improved.   ROV 12/31/15 -- Patient has a history of COPD Currently being managed on Spiriva and chronic prednisone at 10 mg daily. Also with hypoxemic respiratory failure currently managed on 2L/min at all times. She has remained quite limited. Able to walk some through the house but has to stop. She was able to get Pulm rehab through [facility], lasted 6 weeks. She benefited from it. She is doing breathing exercises as well. No extra abx or pred since last time. Her albuterol use may be down slightly. She is concerned about pred side effects - she has gained weight.   Acute office visit 01/13/16 -- this is a acute follow-up visit for history of hypoxemic respiratory failure in the setting of COPD. She has been managed on chronic prednisone which we have discussed trying to decrease. She has had compression fractures and been very limiting which has made coming off the steroids desirable. Unfortunately she had an acute compression fracture T5 since last visit. Her breathing is very limiting. Difficulty taking spiriva due to insp strength and back pain. Has not tolerated  nebs, multiple other inhaled meds.    Review of Systems  As per HPI.    Past Medical History:  Diagnosis Date  . Abnormal chest x-ray    03/2012: will need OP f/u.  Marland Kitchen Anginal pain ([place])   . ANXIETY 01/01/2007  . BURSITIS, RIGHT HIP 06/04/2009  . CAD (coronary artery disease)    a. BMS to LAD 2010. b. NSTEMI with DES to LAD for ISR 2011. c. Patent stent 03/2012/Imdur added.  . Cataract   . CHEST  PAIN-PRECORDIAL 01/15/2009  . Colon polyps    H/o tubular adenoma of colon  . COPD 01/01/2007   a. Chronic resp failure on home O2.  Marland Kitchen DEPRESSION 01/01/2007  . Eczema 01/08/2011  . GROIN PAIN 06/20/2008  . Headache(784.0) 01/01/2007  . Hemorrhoid   . HYPERTENSION 01/01/2007  . Impaired glucose tolerance 01/07/2011  . LOW BACK PAIN 01/01/2007  . Muscle weakness (generalized) 06/04/2009  . OSTEOARTHRITIS, HIP 07/01/2008  . OSTEOPOROSIS 01/01/2007  . Pneumonia 1998  . Rosacea 01/08/2011  . Shortness of breath   . SYNCOPE 01/01/2007  . TIA (transient ischemic attack)   . TRANSIENT ISCHEMIC ATTACK, HX OF 01/01/2007     Family History  Problem Relation Age of Onset  . Osteoporosis Mother   . Heart disease Sister   . Emphysema Sister   . Seizures Sister     epilepsy  . Cardiomyopathy Sister   . Heart attack Sister      Social History   Social History  . Marital status: Married    Spouse name: N/A  . Number of children: 4  . Years of education: N/A   Occupational History  . disabled back since 2003    Social History Main Topics  . Smoking status: Former Smoker    Packs/day: 1.50    Years: 51.00    Types: Cigarettes    Quit date: 07/24/2005  . Smokeless tobacco: Never Used  . Alcohol use No  . Drug use: No  . Sexual activity: Not on file   Other Topics Concern  . Not on file   Social History Narrative  . No narrative on file     Allergies  Allergen Reactions  . Aspirin Other (See Comments)     cns bleed risk  . Other Other (See Comments)    Stiloto - severe reaction - caused inability to breath  . Codeine Rash     Outpatient Medications Prior to Visit  Medication Sig Dispense Refill  . ascorbic acid (VITAMIN C) 1000 MG tablet Take 1,000 mg by mouth daily.    . Cholecalciferol (VITAMIN D) 1000 UNITS capsule Take 1,000 Units by mouth every evening.     . clopidogrel (PLAVIX) 75 MG tablet TAKE 1 TABLET (75 MG TOTAL) BY MOUTH DAILY. 30 tablet 11  . docusate sodium  (COLACE) 100 MG capsule Take 100 mg by mouth daily as needed. For constipation    . isosorbide mononitrate (IMDUR) 30 MG 24 hr tablet TAKE 0.5 TABLETS (15 MG TOTAL) BY MOUTH DAILY. 45 tablet 3  . nitroGLYCERIN (NITROSTAT) 0.4 MG SL tablet Place 1 tablet (0.4 mg total) under the tongue every 5 (five) minutes as needed for chest pain (up to 3 doses). 25 tablet 4  . nortriptyline (PAMELOR) 10 MG capsule Take 20 mg by mouth at bedtime.     . ondansetron (ZOFRAN) 4 MG tablet Take 1 tablet (4 mg total) by mouth every 8 (eight) hours as needed for nausea or vomiting. Lueders  tablet 0  . polyethylene glycol (MIRALAX / GLYCOLAX) packet Take 17 g by mouth daily as needed for mild constipation.    . polyvinyl alcohol (LIQUIFILM TEARS) 1.4 % ophthalmic solution Place 1 drop into both eyes daily.    . predniSONE (DELTASONE) 10 MG tablet TAKE 1 TABLET (10 MG TOTAL) BY MOUTH DAILY WITH BREAKFAST. 30 tablet 5  . SPIRIVA HANDIHALER 18 MCG inhalation capsule INHALE 1 CAPSULE VIA HANDIHALER ONCE DAILY AT THE SAME TIME EVERY DAY 90 capsule 1  . traMADol (ULTRAM) 50 MG tablet Take 100 mg by mouth every 6 (six) hours as needed for moderate pain.     Marland Kitchen albuterol (PROVENTIL HFA;VENTOLIN HFA) 108 (90 Base) MCG/ACT inhaler Inhale 2 puffs into the lungs every 6 (six) hours as needed for wheezing or shortness of breath. 3 Inhaler 3   No facility-administered medications prior to visit.    Objective:   Physical Exam Vitals:   01/13/16 1021  BP: 132/86  Pulse: 82  SpO2: 98%  Weight: 148 lb (67.1 kg)   Gen: Pleasant, thin elderly woman, in no distress,  normal affect, no distress on 2 L/m  ENT: No lesions,  mouth clear,  oropharynx clear, no postnasal drip  Neck: No JVD, no stridor  Lungs: No use of accessory muscles,  B soft exp wheezes.   Cardiovascular: RRR, heart sounds normal, no murmur or gallops, no peripheral edema  Musculoskeletal: No deformities, no cyanosis or clubbing  Neuro: alert, non focal  Skin:  Warm, no lesions or rashes   Assessment & Plan:  COPD (chronic obstructive pulmonary disease) with emphysema Clearly has severe obstructive lung disease. Very difficult strike a balance between her need for low-dose prednisone and the serious side effects including osteopenia. She has recent compression fractures. Call for me to assess whether her recent overall decline is related to the back injury or to her progressive obstructive lung disease. I hope that it's the former. If so she should recoup some function with time. I did broach subject with her today that her lung disease is progressing and we may get to a point where we have to focus on symptom management. I brought up the subject of hospice as an option for [place] in the future. I also explained that some of her dyspnea may need to be treated with narcotics in the future. At this point narcotics administration is copper dated by the fact that she has chronic back injuries and pain and has a contract with the pain clinic. In fact I suspect that she would benefit right now from an increase in her narcotics for her acute back injury. She will need to review this with the pain clinic. For now I'll continue her on prednisone 5 mg daily we will consider further decrease if she tolerates. Continue same bronchodilators and oxygen as ordered.  80% of this 30 minute visit was spent discussing her disease process, her other treating underlying issues affecting her functional capacity. We also discussed the role of hospice care in progressive COPD and hypoxemic respiratory failure.  Baltazar Apo, MD, PhD 01/13/2016, 3:56 PM [facility] [phone] or if no answer [phone]

## 2016-01-13 NOTE — Patient Instructions (Signed)
We will continue spiriva and albuterol, oxygen as you are taking it.  We will stay on prednisone 5mg  for now.  Please follow up at the pain clinic to discuss the fact that you have a new compression fracture and that your medications may need temporary adjustment.  Follow with Dr Lamonte Sakai next available appointment.  Home health assessment regarding wheelchair suitable for her home/.

## 2016-01-13 NOTE — Assessment & Plan Note (Signed)
Clearly has severe obstructive lung disease. Very difficult strike a balance between her need for low-dose prednisone and the serious side effects including osteopenia. She has recent compression fractures. Call for me to assess whether her recent overall decline is related to the back injury or to her progressive obstructive lung disease. I hope that it's the former. If so she should recoup some function with time. I did broach subject with her today that her lung disease is progressing and we may get to a point where we have to focus on symptom management. I brought up the subject of hospice as an option for [place] in the future. I also explained that some of her dyspnea may need to be treated with narcotics in the future. At this point narcotics administration is copper dated by the fact that she has chronic back injuries and pain and has a contract with the pain clinic. In fact I suspect that she would benefit right now from an increase in her narcotics for her acute back injury. She will need to review this with the pain clinic. For now I'll continue her on prednisone 5 mg daily we will consider further decrease if she tolerates. Continue same bronchodilators and oxygen as ordered.

## 2016-01-15 DIAGNOSIS — J449 Chronic obstructive pulmonary disease, unspecified: Secondary | ICD-10-CM | POA: Diagnosis not present

## 2016-01-18 DIAGNOSIS — J449 Chronic obstructive pulmonary disease, unspecified: Secondary | ICD-10-CM | POA: Diagnosis not present

## 2016-01-19 ENCOUNTER — Telehealth: Payer: Self-pay | Admitting: Emergency Medicine

## 2016-01-19 MED ORDER — PREDNISONE 20 MG PO TABS: ORAL_TABLET | ORAL | 0 refills | Status: DC

## 2016-01-19 NOTE — Telephone Encounter (Signed)
Spoke with pt and her husband and reviewed Dr Agustina Caroli recommendations with them.  They verbalized understanding.  Rx for Prednisone sent and f/u ov scheduled.

## 2016-01-19 NOTE — Telephone Encounter (Signed)
I suspect that she is approaching hospice care. Given her refractory symptoms I bel9ieve it would be reasonable to increase her prednisone (understanding the risks and side effects) to see if she can get any relief. If she agrees, please have her take 40mg  qd x 2 days, then 30mg  qd x 2 days then to 20mg  daily indefinitely until we follow up.   Let them know that if severe symptoms persist then we will see her ion office, consider other treatment options including hospice.

## 2016-01-19 NOTE — Telephone Encounter (Signed)
Called and spoke to pt's husband, Percell Miller. Percell Miller states the pt is continuing to have severe bouts of SOB when active. Percell Miller is requesting to move forward with another treatment plan to help with pt's SOB.  Per last OV note Hospice was mentioned, Percell Miller was hesitant on referring to Hospice because of the association with end of life care. Percell Miller is afraid of another "episode" happening and her not be able to catch her breath. Percell Miller is requesting a response from [place] today.   Dr. Lamonte Sakai please advise. Thanks.

## 2016-01-28 ENCOUNTER — Encounter: Payer: Self-pay | Admitting: Emergency Medicine

## 2016-01-28 ENCOUNTER — Ambulatory Visit (INDEPENDENT_AMBULATORY_CARE_PROVIDER_SITE_OTHER): Payer: Medicare Other | Admitting: Emergency Medicine

## 2016-01-28 DIAGNOSIS — J439 Emphysema, unspecified: Secondary | ICD-10-CM

## 2016-01-28 NOTE — Patient Instructions (Addendum)
We will continue prednisone 20mg  daily for now Please continue your spiriva once a day Take albuterol 2 puffs up to every 4 hours if needed for shortness of breath.  We will consult again with [facility] regarding an appropriate wheelchair that works in your home.  Continue your oxygen at all times as you are using it.  Follow with Dr Lamonte Sakai in 3 months or sooner if you have any problems.

## 2016-01-28 NOTE — Progress Notes (Signed)
Subjective:    Patient ID: Alexis Higgins, female    DOB: 04/04/1941, 75 y.o.   MRN: [id number]  HPI 75 year old woman, former smoker (75 pack years) with a history of CAD, hypertension, cerebrovascular disease and COPD that has been followed by Alexis Alexis Higgins.    PFT performed in August 2014 that I personally reviewed. These showed very severe obstruction with an FEV1 of 0.69 L or 32% of predicted, and a positive bronchodilator response.     ROV 09/26/15 -- follow-up visit for COPD. She has most recently been managed with chronic prednisone 10 mg daily. She is also on Spiriva qd, didn't feel that Anoro helped as much. She had been well until the last month, started to have some increased exertional SOB. No real change in mucous production. She has benefited from Alexis Higgins therapy, PT, Resp therapy. Her SABA use has improved.   ROV 12/31/15 -- Patient has a history of COPD Currently being managed on Spiriva and chronic prednisone at 10 mg daily. Also with hypoxemic respiratory failure currently managed on 2L/min at all times. She has remained quite limited. Able to walk some through the house but has to stop. She was able to get Pulm rehab through Alexis Higgins, lasted 6 weeks. She benefited from it. She is doing breathing exercises as well. No extra abx or pred since last time. Her albuterol use may be down slightly. She is concerned about pred side effects - she has gained weight.   Acute office visit 01/13/16 -- this is a acute follow-up visit for history of hypoxemic respiratory failure in the setting of COPD. She has been managed on chronic prednisone which we have discussed trying to decrease. She has had compression fractures and been very limiting which has made coming off the steroids desirable. Unfortunately she had an acute compression fracture T5 since last visit. Her breathing is very limiting. Difficulty taking spiriva due to insp strength and back pain. Has not tolerated nebs, multiple other inhaled meds.    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 Her bronchodilator therapy has been quite complicated due to side effects. She is currently using the Spiriva HandiHaler but I am concerned about the effectiveness of delivery. Unfortunately she poorly tolerates nebulizers due to agitation. Since our last visit she was having more dyspnea and increase her prednisone with a brief burst and then taper down to 20 mg daily. Her breathing has benefited from this.                                                      Review of Systems  As per HPI.   Objective:   Physical Exam Vitals:   01/28/16 1111  BP: 130/70  Pulse: 79  SpO2: 96%  Weight: 147 lb (66.7 kg)  Height: 5\' 6"  (1.676 m)   Gen: Pleasant, thin elderly woman, in no distress,  normal affect, no distress on 2 L/m  ENT: No lesions,  mouth clear,  oropharynx clear, no postnasal drip  Neck: No JVD, no stridor  Lungs: No use of accessory muscles,  B soft exp wheezes.   Cardiovascular: RRR, heart sounds normal, no murmur or gallops, no peripheral edema  Musculoskeletal: No deformities, no cyanosis or clubbing  Neuro: alert, non focal  Skin: Warm, no  lesions or rashes   Assessment & Plan:  COPD (chronic obstructive pulmonary disease) with emphysema We will continue prednisone 20mg  daily for now Please continue your spiriva once a day Take albuterol 2 puffs up to every 4 hours if needed for shortness of breath.  We will consult again with Alexis Higgins regarding an appropriate wheelchair that works in your home.  Continue your oxygen at all times as you are using it.  Follow with Alexis Higgins in 3 months or sooner if you have any problems.   Alexis Apo, MD, PhD 01/28/2016, 11:27 AM [facility] [phone] or if no answer [phone]

## 2016-01-28 NOTE — Assessment & Plan Note (Signed)
We will continue prednisone 20mg  daily for now Please continue your spiriva once a day Take albuterol 2 puffs up to every 4 hours if needed for shortness of breath.  We will consult again with [facility] regarding an appropriate wheelchair that works in your home.  Continue your oxygen at all times as you are using it.  Follow with Dr Lamonte Sakai in 3 months or sooner if you have any problems.

## 2016-01-30 ENCOUNTER — Telehealth: Payer: Self-pay

## 2016-01-30 MED ORDER — TIOTROPIUM BROMIDE MONOHYDRATE 18 MCG IN CAPS: 18.0000 ug | ORAL_CAPSULE | Freq: Every day | RESPIRATORY_TRACT | 1 refills | Status: DC

## 2016-01-30 NOTE — Telephone Encounter (Signed)
Paperwork/Rx signed, faxed, original placed in cabinet for pt pick up, pt advised of same

## 2016-01-30 NOTE — Telephone Encounter (Signed)
Paperwork completed, Rx printed and placed on MD's desk for signatures

## 2016-01-30 NOTE — Telephone Encounter (Signed)
Spoke with pt over the phone. Advised her of the error we have made. I assume that "provider" meant to be chosen as the cancel reason for the appointment not "pregnancy". She was not that concerned about this, she was just confused. Pt appreciated [place] looking in to this for her. Nothing further was needed at this time.

## 2016-02-05 ENCOUNTER — Other Ambulatory Visit (INDEPENDENT_AMBULATORY_CARE_PROVIDER_SITE_OTHER): Payer: Medicare Other

## 2016-02-05 ENCOUNTER — Emergency Department (HOSPITAL_COMMUNITY)
Admission: EM | Admit: 2016-02-05 | Discharge: 2016-02-05 | Disposition: A | Discharge status: Home / Self Care | Payer: Medicare Other | Attending: Emergency Medicine | Admitting: Emergency Medicine

## 2016-02-05 ENCOUNTER — Encounter (HOSPITAL_COMMUNITY): Payer: Self-pay | Admitting: Emergency Medicine

## 2016-02-05 ENCOUNTER — Emergency Department (HOSPITAL_COMMUNITY): Payer: Medicare Other

## 2016-02-05 DIAGNOSIS — Z79899 Other long term (current) drug therapy: Secondary | ICD-10-CM | POA: Insufficient documentation

## 2016-02-05 DIAGNOSIS — E785 Hyperlipidemia, unspecified: Secondary | ICD-10-CM

## 2016-02-05 DIAGNOSIS — I10 Essential (primary) hypertension: Secondary | ICD-10-CM | POA: Diagnosis not present

## 2016-02-05 DIAGNOSIS — Z87891 Personal history of nicotine dependence: Secondary | ICD-10-CM | POA: Diagnosis not present

## 2016-02-05 DIAGNOSIS — R0602 Shortness of breath: Secondary | ICD-10-CM | POA: Diagnosis not present

## 2016-02-05 DIAGNOSIS — Z9861 Coronary angioplasty status: Secondary | ICD-10-CM | POA: Insufficient documentation

## 2016-02-05 DIAGNOSIS — R069 Unspecified abnormalities of breathing: Secondary | ICD-10-CM | POA: Diagnosis not present

## 2016-02-05 DIAGNOSIS — R7302 Impaired glucose tolerance (oral): Secondary | ICD-10-CM | POA: Diagnosis not present

## 2016-02-05 DIAGNOSIS — J441 Chronic obstructive pulmonary disease with (acute) exacerbation: Secondary | ICD-10-CM | POA: Diagnosis not present

## 2016-02-05 DIAGNOSIS — I2510 Atherosclerotic heart disease of native coronary artery without angina pectoris: Secondary | ICD-10-CM | POA: Diagnosis not present

## 2016-02-05 DIAGNOSIS — R079 Chest pain, unspecified: Secondary | ICD-10-CM

## 2016-02-05 DIAGNOSIS — R0789 Other chest pain: Secondary | ICD-10-CM | POA: Diagnosis not present

## 2016-02-05 LAB — LIPID PANEL
CHOL/HDL RATIO: 2
CHOLESTEROL: 207 mg/dL — AB (ref 0–200)
HDL: 112.2 mg/dL (ref >39.00)
NonHDL: 95.1
TRIGLYCERIDES: 276 mg/dL — AB (ref 0.0–149.0)
VLDL: 55.2 mg/dL — AB (ref 0.0–40.0)

## 2016-02-05 LAB — I-STAT TROPONIN, ED: TROPONIN I, POC: 0.01 ng/mL (ref 0.00–0.08)

## 2016-02-05 LAB — CBC WITH DIFFERENTIAL/PLATELET
BASOS ABS: 0 10*3/uL (ref 0.0–0.1)
BASOS PCT: 0 %
Eosinophils Absolute: 0.1 10*3/uL (ref 0.0–0.7)
Eosinophils Relative: 0 %
HEMATOCRIT: 42.2 % (ref 36.0–46.0)
Hemoglobin: 13.3 g/dL (ref 12.0–15.0)
LYMPHS PCT: 9 %
Lymphs Abs: 1.6 10*3/uL (ref 0.7–4.0)
MCH: 28.9 pg (ref 26.0–34.0)
MCHC: 31.5 g/dL (ref 30.0–36.0)
MCV: 91.5 fL (ref 78.0–100.0)
Monocytes Absolute: 0.4 10*3/uL (ref 0.1–1.0)
Monocytes Relative: 2 %
NEUTROS ABS: 15.2 10*3/uL — AB (ref 1.7–7.7)
NEUTROS PCT: 89 %
Platelets: 273 10*3/uL (ref 150–400)
RBC: 4.61 MIL/uL (ref 3.87–5.11)
RDW: 13.6 % (ref 11.5–15.5)
WBC: 17.3 10*3/uL — AB (ref 4.0–10.5)

## 2016-02-05 LAB — HEPATIC FUNCTION PANEL
ALBUMIN: 3.7 g/dL (ref 3.5–5.2)
ALT: 11 U/L (ref 0–35)
AST: 13 U/L (ref 0–37)
Alkaline Phosphatase: 78 U/L (ref 39–117)
Bilirubin, Direct: 0.1 mg/dL (ref 0.0–0.3)
Total Bilirubin: 0.4 mg/dL (ref 0.2–1.2)
Total Protein: 6.1 g/dL (ref 6.0–8.3)

## 2016-02-05 LAB — D-DIMER, QUANTITATIVE: D-Dimer, Quant: <0.27 ug/mL-FEU (ref 0.00–0.50)

## 2016-02-05 LAB — BASIC METABOLIC PANEL
BUN: 11 mg/dL (ref 6–23)
CALCIUM: 9.1 mg/dL (ref 8.4–10.5)
CO2: 34 mEq/L — ABNORMAL HIGH (ref 19–32)
CREATININE: 0.96 mg/dL (ref 0.40–1.20)
Chloride: 98 mEq/L (ref 96–112)
GFR: 60.29 mL/min (ref >60.00)
Glucose, Bld: 88 mg/dL (ref 70–99)
POTASSIUM: 3.9 meq/L (ref 3.5–5.1)
Sodium: 142 mEq/L (ref 135–145)

## 2016-02-05 LAB — HEMOGLOBIN A1C: Hgb A1c MFr Bld: 5.7 % (ref 4.6–6.5)

## 2016-02-05 LAB — LDL CHOLESTEROL, DIRECT: LDL DIRECT: 49 mg/dL

## 2016-02-05 MED ORDER — LIDOCAINE 5 % EX PTCH: 1.0000 | MEDICATED_PATCH | CUTANEOUS | 0 refills | Status: DC

## 2016-02-05 MED ORDER — LIDOCAINE 5 % EX PTCH
1.0000 | MEDICATED_PATCH | CUTANEOUS | Status: DC
  Administered 2016-02-05: 1 via TRANSDERMAL
  Filled 2016-02-05: qty 1

## 2016-02-05 NOTE — ED Notes (Signed)
Bed: WA02 Expected date:  Expected time:  Means of arrival:  Comments: EMS-SOB 

## 2016-02-05 NOTE — ED Provider Notes (Signed)
Emergency Department Provider Note   I have reviewed the triage vital signs and the nursing notes.   HISTORY  Chief Complaint Shortness of Breath   HPI Alexis Higgins is a 75 y.o. female with PMH of severe COPD on home O2, h/o TIA, and recent thoracic spine fracture presents to the ED for evaluation of intermittent left anterior chest pain that is worse with inspiration. This pain has occurred several times a day with multiple brief episodes today. The patient went for a blood draw appointment this morning after which she had multiple pain episodes with some resulting difficulty breathing. Her husband called EMS who gave albuterol which improved her symptoms. Patient is taking chronic prednisone (20 mg daily) for her COPD. No associated fever or productive cough. No history of blood clots. Patient has been managed by the pain management clinic for her ongoing back pain. She's been taking tramadol at home with no relief. Pain is made much worse with any movement and improved with rest. Denies severe pain during my exam.    Past Medical History:  Diagnosis Date  . Abnormal chest x-ray    03/2012: will need OP f/u.  Marland Kitchen Anginal pain ([place])   . ANXIETY 01/01/2007  . BURSITIS, RIGHT HIP 06/04/2009  . CAD (coronary artery disease)    a. BMS to LAD 2010. b. NSTEMI with DES to LAD for ISR 2011. c. Patent stent 03/2012/Imdur added.  . Cataract   . CHEST PAIN-PRECORDIAL 01/15/2009  . Colon polyps    H/o tubular adenoma of colon  . COPD 01/01/2007   a. Chronic resp failure on home O2.  Marland Kitchen DEPRESSION 01/01/2007  . Eczema 01/08/2011  . GROIN PAIN 06/20/2008  . Headache(784.0) 01/01/2007  . Hemorrhoid   . HYPERTENSION 01/01/2007  . Impaired glucose tolerance 01/07/2011  . LOW BACK PAIN 01/01/2007  . Muscle weakness (generalized) 06/04/2009  . OSTEOARTHRITIS, HIP 07/01/2008  . OSTEOPOROSIS 01/01/2007  . Pneumonia 1998  . Rosacea 01/08/2011  . Shortness of breath   . SYNCOPE 01/01/2007  . TIA  (transient ischemic attack)   . TRANSIENT ISCHEMIC ATTACK, HX OF 01/01/2007    Patient Active Problem List   Diagnosis Date Noted  . Smoker 08/02/2014  . Peripheral vascular disease ([place]) 08/02/2014  . Abdominal pain, LLQ 11/22/2013  . Black stools 11/22/2013  . Heme positive stool 11/22/2013  . Acute blood loss anemia 10/24/2013  . Displaced fracture of left femoral neck ([place]) 09/30/2013  . Fracture of femoral neck, left, closed 09/30/2013  . Fall at home 09/30/2013  . Head contusion 09/30/2013  . Leg weakness, bilateral 07/21/2012  . 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 Unstable angina pectoris ([place]) 04/12/2012  . Chronic respiratory failure ([place]) 02/13/2012  . Localized swelling, mass and lump, neck 01/06/2012  . Dyspnea 11/22/2011  . Personal history of colonic polyps 09/06/2011  . Eczema 01/08/2011  . Rosacea 01/08/2011  . Dizziness - light-headed 01/08/2011  . Encounter for Korie Streat-term (current) use of high-risk medication 01/08/2011  . Impaired glucose tolerance 01/07/2011  . Preventative health care 01/07/2011  . BURSITIS, RIGHT HIP 06/04/2009  . CAD, NATIVE VESSEL 01/15/2009  . Other symptoms involving cardiovascular system 01/15/2009  . CHEST PAIN-PRECORDIAL 01/15/2009  . OSTEOARTHRITIS, HIP 07/01/2008  . Hyperlipidemia 01/01/2007  . ANXIETY 01/01/2007  . DEPRESSION 01/01/2007  . Essential hypertension 01/01/2007  . COPD (chronic obstructive pulmonary disease) with emphysema ([place]) 01/01/2007  . LOW BACK PAIN 01/01/2007  . OSTEOPOROSIS 01/01/2007  . SYNCOPE 01/01/2007  .  Headache(784.0) 01/01/2007  . TRANSIENT ISCHEMIC ATTACK, HX OF 01/01/2007    Past Surgical History:  Procedure Laterality Date  . ABDOMINAL HYSTERECTOMY    . APPENDECTOMY    . BACK SURGERY    . BREAST ENHANCEMENT SURGERY    . COLONOSCOPY  01/25/2002   tubular adenoma,hemorrhoids, hyperplastic  colon polyps  . COLONOSCOPY  02/17/2005   hemorrhoids  . CORONARY ANGIOPLASTY    . CORONARY STENT  PLACEMENT    . HIP ARTHROPLASTY Left 10/01/2013   Procedure: ARTHROPLASTY UNIPOLAR   HIP;  Surgeon: Marianna Payment, MD;  Location: [facility];  Service: Orthopedics;  Laterality: Left;  . HIP SURGERY Left    DR XU     PROXIMAL NECK   . LEFT HEART CATHETERIZATION WITH CORONARY ANGIOGRAM N/A 04/13/2012   Procedure: LEFT HEART CATHETERIZATION WITH CORONARY ANGIOGRAM;  Surgeon: Burnell Blanks, MD;  Location: [facility] CATH LAB;  Service: Cardiovascular;  Laterality: N/A;  . OOPHORECTOMY     one ovary  . s/p bilat cataract  2010  . sp lumbar disc surgury     [name]    Current Outpatient Rx  . Order #: [id number] Class: Historical Med  . Order #: [id number] Class: Historical Med  . Order #: [id number] Class: Historical Med  . Order #: [id number] Class: Normal  . Order #: [id number] Class: Historical Med  . Order #: [id number] Class: Normal  . Order #: [id number] Class: Print  . Order #: [id number] Class: Historical Med  . Order #: [id number] Class: Print  . Order #: [id number] Class: Historical Med  . Order #: [id number] Class: Print  . Order #: [id number] Class: Historical Med  . Order #: [id number] Class: Historical Med  . Order #: [id number] Class: Normal  . Order #: [id number] Class: Normal  . Order #: [id number] Class: Print  . Order #: [id number] Class: Historical Med  . Order #: [id number] Class: Historical Med    Allergies Aspirin; Other; and Codeine  Family History  Problem Relation Age of Onset  . Osteoporosis Mother   . Heart disease Sister   . Emphysema Sister   . Seizures Sister     epilepsy  . Cardiomyopathy Sister   . Heart attack Sister     Social History Social History  Substance Use Topics  . Smoking status: Former Smoker    Packs/day: 1.50    Years: 51.00    Types: Cigarettes    Quit date: 07/24/2005  . Smokeless tobacco: Never Used  . Alcohol use No    Review of Systems  Constitutional: No fever/chills Eyes: No visual changes. ENT: No sore throat. Cardiovascular: Positive  left anterior chest pain. Respiratory: Denies shortness of breath. Gastrointestinal: No abdominal pain.  No nausea, no vomiting.  No diarrhea.  No constipation. Genitourinary: Negative for dysuria. Musculoskeletal: Positive for thoracic back pain. Skin: Negative for rash. Neurological: Negative for headaches, focal weakness or numbness.  10-point ROS otherwise negative.  ____________________________________________   PHYSICAL EXAM:  VITAL SIGNS: ED Triage Vitals  Enc Vitals Group     BP 02/05/16 1349 137/67     Pulse Rate 02/05/16 1349 90     Resp 02/05/16 1349 16     Temp 02/05/16 1349 98.4 F (36.9 C)     Temp Source 02/05/16 1349 Oral     SpO2 02/05/16 1344 97 %     Weight 02/05/16 1350 147 lb (66.7 kg)     Height 02/05/16 1350 5\' 6"  (1.676 m)     Pain Score 02/05/16 1350 9    Constitutional: Alert and oriented. Well appearing and in  no acute distress. Eyes: Conjunctivae are normal. Head: Atraumatic. Nose: No congestion/rhinnorhea. Mouth/Throat: Mucous membranes are moist.  Oropharynx non-erythematous. Neck: No stridor. No cervical spine tenderness to palpation. Cardiovascular: Normal rate, regular rhythm. Good peripheral circulation. Grossly normal heart sounds. No tenderness to palpation of the left anterior chest wall.  Respiratory: Normal respiratory effort.  No retractions. Lungs with bilaterally expiratory wheezing.  Gastrointestinal: Soft and nontender. No distention.  Musculoskeletal: No lower extremity tenderness nor edema. No gross deformities of extremities. Neurologic:  Normal speech and language. No gross focal neurologic deficits are appreciated.  Skin:  Skin is warm, dry and intact. No rash noted. Psychiatric: Mood and affect are normal. Speech and behavior are normal.  ____________________________________________   LABS (all labs ordered are listed, but only abnormal results are displayed)  Labs Reviewed  CBC WITH DIFFERENTIAL/PLATELET - Abnormal;  Notable for the following:       Result Value   WBC 17.3 (*)    Neutro Abs 15.2 (*)    All other components within normal limits  D-DIMER, QUANTITATIVE (NOT AT [facility])  I-STAT TROPOININ, ED   ____________________________________________  EKG   EKG Interpretation  Date/Time:  Thursday February 05 2016 15:01:17 EDT Ventricular Rate:  98 PR Interval:    QRS Duration: 83 QT Interval:  349 QTC Calculation: 446 R Axis:   70 Text Interpretation:  Sinus rhythm Anteroseptal infarct, old No STEMI. Similar to prior tracing.  Confirmed by Jovonni Borquez MD, Odessie Polzin [phone]) on 02/05/2016 3:16:16 PM       ____________________________________________  RADIOLOGY  Dg Chest 2 View  Result Date: 02/05/2016 CLINICAL DATA:  Shortness of breath, left chest pain EXAM: CHEST  2 VIEW COMPARISON:  CTA chest dated 01/06/2016 FINDINGS: Lungs are essentially clear. Chronic interstitial markings/emphysematous changes. No pleural effusion or pneumothorax. Apparent nodular opacity in the left upper lobe corresponds to sclerosis involving the left anterior 1st rib. The heart is normal in size. Mild inferior endplate compression deformity involving a mid thoracic vertebral body, likely T5 when correlating with CT. IMPRESSION: No evidence of acute cardiopulmonary disease. Electronically Signed   By: Julian Hy M.D.   On: 02/05/2016 15:02    ____________________________________________   PROCEDURES  Procedure(s) performed:   Procedures  None ____________________________________________   INITIAL IMPRESSION / ASSESSMENT AND PLAN / ED COURSE  Pertinent labs & imaging results that were available during my care of the patient were reviewed by me and considered in my medical decision making (see chart for details).  Patient presents to the ED with left anterior chest pain over the last 4 days that is made worse with deep breathing or any movement. She was diagnosed with thoracic spine fracture last month of  unknown etiology. No neurological deficits. No tenderness to palpation of the left anterior chest wall. Patient does have a history of ACS but feel this diagnosis is less likely with pain on movement and deep breathing. Patient is on home O2 requirement currently. No acute distress. Low suspicion for PE but with pleuritic pain and breathing difficulty I plan to send d-dimer, troponin, and CXR.   03:58 PM Lidocaine patch applied with improved symptoms. Troponin and d-dimer negative. EKG with no evidence of acute ischemia. Suspect a musculoskeletal etiology of the patient's pain. She is actively followed in pain clinic. Advised that she continue with her oral medications and return to the emergency department with any new or worsening chest pain/dyspnea. She is having no hypoxemia on her home oxygen. She has oxygen desaturation at baseline  with any ambulation. Patient is already on steroids at home. Plan for discharge at this time.   At this time, I do not feel there is any life-threatening condition present. I have reviewed and discussed all results (EKG, imaging, lab, urine as appropriate), exam findings with patient. I have reviewed nursing notes and appropriate previous records.  I feel the patient is safe to be discharged home without further emergent workup. Discussed usual and customary return precautions. Patient and family (if present) verbalize understanding and are comfortable with this plan.  Patient will follow-up with their primary care provider. If they do not have a primary care provider, information for follow-up has been provided to them. All questions have been answered.  ____________________________________________  FINAL CLINICAL IMPRESSION(S) / ED DIAGNOSES  Final diagnoses:  Chest pain, unspecified chest pain type  COPD exacerbation ([place])     MEDICATIONS GIVEN DURING THIS VISIT:  Medications  lidocaine (LIDODERM) 5 % 1 patch (1 patch Transdermal Patch Applied 02/05/16 1551)      NEW OUTPATIENT MEDICATIONS STARTED DURING THIS VISIT:  Discharge Medication List as of 02/05/2016  4:01 PM    START taking these medications   Details  lidocaine (LIDODERM) 5 % Place 1 patch onto the skin daily. Remove & Discard patch within 12 hours or as directed by MD, Starting Thu 02/05/2016, Print          Note:  This document was prepared using Dragon voice recognition software and may include unintentional dictation errors.  Nanda Quinton, MD Emergency Medicine   Margette Fast, MD 02/05/16 [phone]

## 2016-02-05 NOTE — ED Triage Notes (Signed)
Per EMS pt from home c/o SOB. Pt states she is usually short of breath on a daily basis due to COPD but has been worse the past few days. Pt on 2L [place] continuously. Lung sounds diminished sating 99% on 2L [place]. Pt states pain under left side rib/breast region. Pt reports August 13th she had a fracture of the thoracic vertebrae.  Pt given 10 mg albuterol and .5 Atrovent en route. Pt reports she takes 20 mg prednisone daily and also took her Spiriva inhaler this am.

## 2016-02-05 NOTE — Discharge Instructions (Signed)

## 2016-02-06 ENCOUNTER — Telehealth (HOSPITAL_COMMUNITY): Payer: Self-pay | Admitting: Radiology

## 2016-02-06 NOTE — Telephone Encounter (Signed)
Called pt, spoke to her and her husband concerning her T5 fracture. They would like for me to get the insurance authorization and call them once that is approved and they will decide from there. JM

## 2016-02-10 ENCOUNTER — Other Ambulatory Visit: Payer: Self-pay | Admitting: Emergency Medicine

## 2016-02-10 ENCOUNTER — Encounter: Payer: Self-pay | Admitting: Internal Medicine

## 2016-02-10 ENCOUNTER — Telehealth (HOSPITAL_COMMUNITY): Payer: Self-pay

## 2016-02-10 ENCOUNTER — Ambulatory Visit (INDEPENDENT_AMBULATORY_CARE_PROVIDER_SITE_OTHER): Payer: Medicare Other | Admitting: Internal Medicine

## 2016-02-10 ENCOUNTER — Other Ambulatory Visit (HOSPITAL_COMMUNITY): Payer: Self-pay | Admitting: Interventional Radiology

## 2016-02-10 VITALS — BP 140/80 | HR 100 | Temp 98.0°F | Resp 20 | Wt 148.0 lb

## 2016-02-10 DIAGNOSIS — IMO0002 Reserved for concepts with insufficient information to code with codable children: Secondary | ICD-10-CM

## 2016-02-10 DIAGNOSIS — Z299 Encounter for prophylactic measures, unspecified: Secondary | ICD-10-CM

## 2016-02-10 DIAGNOSIS — Z418 Encounter for other procedures for purposes other than remedying health state: Secondary | ICD-10-CM | POA: Diagnosis not present

## 2016-02-10 DIAGNOSIS — Z0000 Encounter for general adult medical examination without abnormal findings: Secondary | ICD-10-CM

## 2016-02-10 MED ORDER — ALBUTEROL SULFATE (2.5 MG/3ML) 0.083% IN NEBU: 2.5000 mg | INHALATION_SOLUTION | Freq: Four times a day (QID) | RESPIRATORY_TRACT | 1 refills | Status: DC | PRN

## 2016-02-10 NOTE — Patient Instructions (Addendum)
You had the flu shot today  Please continue all other medications as before, and refills have been done if requested.  Please have the pharmacy call with any other refills you may need.  Please continue your efforts at being more active, low cholesterol diet, and weight control.  You are otherwise up to date with prevention measures today.  Please keep your appointments with your specialists as you may have planned  Please return in 6 months, or sooner if needed 

## 2016-02-10 NOTE — Progress Notes (Signed)
Pre visit review using our clinic review tool, if applicable. No additional management support is needed unless otherwise documented below in the visit note. 

## 2016-02-10 NOTE — Assessment & Plan Note (Signed)

## 2016-02-10 NOTE — Telephone Encounter (Signed)
Spoke with pt's husband about scheduling procedure after consult. He stated that he would try and get in touch with Dr. About coming off plavix prior to procedure and call me back. AW

## 2016-02-10 NOTE — Progress Notes (Signed)
Subjective:    Patient ID: Alexis Higgins, female    DOB: 11-29-1940, 75 y.o.   MRN: [id number]  HPI  Here for wellness and f/u;  Overall doing ok;  Pt denies Chest pain, worsening SOB, DOE, wheezing, orthopnea, PND, worsening LE edema, palpitations, dizziness or syncope.  Pt denies neurological change such as new headache, facial or extremity weakness.  Pt denies polydipsia, polyuria, or low sugar symptoms. Pt states overall good compliance with treatment and medications, good tolerability, and has been trying to follow appropriate diet.  Pt denies worsening depressive symptoms, suicidal ideation or panic. No fever, night sweats, wt loss, loss of appetite, or other constitutional symptoms.  Pt states good ability with ADL's, has low fall risk, home safety reviewed and adequate, no other significant changes in hearing or vision.  Not active with exercise.  Has gained significant wt with recent tx including prednisone. Wt Readings from Last 3 Encounters:  02/10/16 148 lb (67.1 kg)  02/05/16 147 lb (66.7 kg)  01/28/16 147 lb (66.7 kg)  Has been to ED twice recently in Aug then a few days ago, has been trying to cut back on prednsone but unable.  Has jerks in bed at night, mild, but occas more dramatic and led to recent back pain, seen in ED with MRI thoracic with subacute T5 compressoin fx.  Still seeing pain clinc, but was able to start lidocaine patch, has been referred  For kyphoplasty and will be scheduled soon likely.  Getting tramadol and zofran, and nortyptilene per pain management.   Past Medical History:  Diagnosis Date  . Abnormal chest x-ray    03/2012: will need OP f/u.  Marland Kitchen Anginal pain ([place])   . ANXIETY 01/01/2007  . BURSITIS, RIGHT HIP 06/04/2009  . CAD (coronary artery disease)    a. BMS to LAD 2010. b. NSTEMI with DES to LAD for ISR 2011. c. Patent stent 03/2012/Imdur added.  . Cataract   . CHEST PAIN-PRECORDIAL 01/15/2009  . Colon polyps    H/o tubular adenoma of colon  . COPD  01/01/2007   a. Chronic resp failure on home O2.  Marland Kitchen DEPRESSION 01/01/2007  . Eczema 01/08/2011  . GROIN PAIN 06/20/2008  . Headache(784.0) 01/01/2007  . Hemorrhoid   . HYPERTENSION 01/01/2007  . Impaired glucose tolerance 01/07/2011  . LOW BACK PAIN 01/01/2007  . Muscle weakness (generalized) 06/04/2009  . OSTEOARTHRITIS, HIP 07/01/2008  . OSTEOPOROSIS 01/01/2007  . Pneumonia 1998  . Rosacea 01/08/2011  . Shortness of breath   . SYNCOPE 01/01/2007  . TIA (transient ischemic attack)   . TRANSIENT ISCHEMIC ATTACK, HX OF 01/01/2007   Past Surgical History:  Procedure Laterality Date  . ABDOMINAL HYSTERECTOMY    . APPENDECTOMY    . BACK SURGERY    . BREAST ENHANCEMENT SURGERY    . COLONOSCOPY  01/25/2002   tubular adenoma,hemorrhoids, hyperplastic  colon polyps  . COLONOSCOPY  02/17/2005   hemorrhoids  . CORONARY ANGIOPLASTY    . CORONARY STENT PLACEMENT    . HIP ARTHROPLASTY Left 10/01/2013   Procedure: ARTHROPLASTY UNIPOLAR   HIP;  Surgeon: Marianna Payment, MD;  Location: [facility];  Service: Orthopedics;  Laterality: Left;  . HIP SURGERY Left    DR XU     PROXIMAL NECK   . LEFT HEART CATHETERIZATION WITH CORONARY ANGIOGRAM N/A 04/13/2012   Procedure: LEFT HEART CATHETERIZATION WITH CORONARY ANGIOGRAM;  Surgeon: Burnell Blanks, MD;  Location: [facility] CATH LAB;  Service: Cardiovascular;  Laterality:  N/A;  . OOPHORECTOMY     one ovary  . s/p bilat cataract  2010  . sp lumbar disc surgury     [name]    reports that she quit smoking about 10 years ago. Her smoking use included Cigarettes. She has a 76.50 pack-year smoking history. She has never used smokeless tobacco. She reports that she does not drink alcohol or use drugs. family history includes Cardiomyopathy in her sister; Emphysema in her sister; Heart attack in her sister; Heart disease in her sister; Osteoporosis in her mother; Seizures in her sister. Allergies  Allergen Reactions  . Aspirin Other (See Comments)     cns  bleed risk  . Other Other (See Comments)    Stiloto - severe reaction - caused inability to breath  . Codeine Rash   Current Outpatient Prescriptions on File Prior to Visit  Medication Sig Dispense Refill  . Albuterol Sulfate (PROAIR HFA IN) Inhale 2 puffs into the lungs every 6 (six) hours as needed.    Marland Kitchen ascorbic acid (VITAMIN C) 1000 MG tablet Take 1,000 mg by mouth daily.    . Cholecalciferol (VITAMIN D) 1000 UNITS capsule Take 1,000 Units by mouth every evening.     . clopidogrel (PLAVIX) 75 MG tablet TAKE 1 TABLET (75 MG TOTAL) BY MOUTH DAILY. 30 tablet 11  . docusate sodium (COLACE) 100 MG capsule Take 100 mg by mouth daily as needed. For constipation    . isosorbide mononitrate (IMDUR) 30 MG 24 hr tablet TAKE 0.5 TABLETS (15 MG TOTAL) BY MOUTH DAILY. 45 tablet 3  . lidocaine (LIDODERM) 5 % Place 1 patch onto the skin daily. Remove & Discard patch within 12 hours or as directed by MD 30 patch 0  . lovastatin (MEVACOR) 20 MG tablet Take 20 mg by mouth at bedtime.    . nitroGLYCERIN (NITROSTAT) 0.4 MG SL tablet Place 1 tablet (0.4 mg total) under the tongue every 5 (five) minutes as needed for chest pain (up to 3 doses). 25 tablet 4  . nortriptyline (PAMELOR) 10 MG capsule Take 20 mg by mouth at bedtime.     . ondansetron (ZOFRAN) 4 MG tablet Take 1 tablet (4 mg total) by mouth every 8 (eight) hours as needed for nausea or vomiting. 11 tablet 0  . polyethylene glycol (MIRALAX / GLYCOLAX) packet Take 17 g by mouth daily as needed for mild constipation.    . polyvinyl alcohol (LIQUIFILM TEARS) 1.4 % ophthalmic solution Place 1 drop into both eyes daily.    . predniSONE (DELTASONE) 10 MG tablet TAKE 1 TABLET (10 MG TOTAL) BY MOUTH DAILY WITH BREAKFAST. 30 tablet 5  . tiotropium (SPIRIVA HANDIHALER) 18 MCG inhalation capsule Place 1 capsule (18 mcg total) into inhaler and inhale daily at 6 (six) AM. 90 capsule 1  . traMADol (ULTRAM) 50 MG tablet Take 100 mg by mouth every 6 (six) hours as  needed for moderate pain.     . vitamin B-12 (CYANOCOBALAMIN) 1000 MCG tablet Take 1,000 mcg by mouth daily.     No current facility-administered medications on file prior to visit.     Review of Systems Constitutional: Negative for increased diaphoresis, or other activity, appetite or siginficant weight change other than noted HENT: Negative for worsening hearing loss, ear pain, facial swelling, mouth sores and neck stiffness.   Eyes: Negative for other worsening pain, redness or visual disturbance.  Respiratory: Negative for choking or stridor Cardiovascular: Negative for other chest pain and palpitations.  Gastrointestinal:  Negative for worsening diarrhea, blood in stool, or abdominal distention Genitourinary: Negative for hematuria, flank pain or change in urine volume.  Musculoskeletal: Negative for myalgias or other joint complaints.  Skin: Negative for other color change and wound or drainage.  Neurological: Negative for syncope and numbness. other than noted Hematological: Negative for adenopathy. or other swelling Psychiatric/Behavioral: Negative for hallucinations, SI, self-injury, decreased concentration or other worsening agitation.      Objective:   Physical Exam BP 140/80   Pulse 100   Temp 98 F (36.7 C) (Oral)   Resp 20   Wt 148 lb (67.1 kg)   SpO2 94%   BMI 23.89 kg/m  VS noted,  Constitutional: Pt is oriented to person, place, and time. Appears well-developed and well-nourished, in no significant distress Head: Normocephalic and atraumatic  Eyes: Conjunctivae and EOM are normal. Pupils are equal, round, and reactive to light Right Ear: External ear normal.  Left Ear: External ear normal Nose: Nose normal.  Mouth/Throat: Oropharynx is clear and moist  Neck: Normal range of motion. Neck supple. No JVD present. No tracheal deviation present or significant neck LA or mass Cardiovascular: Normal rate, regular rhythm, normal heart sounds and intact distal pulses.    Pulmonary/Chest: Effort normal and breath sounds without rales or wheezing  Abdominal: Soft. Bowel sounds are normal. NT. No HSM  Musculoskeletal: Normal range of motion. Exhibits no edema Lymphadenopathy: Has no cervical adenopathy.  Neurological: Pt is alert and oriented to person, place, and time. Pt has normal reflexes. No cranial nerve deficit. Motor grossly intact Skin: Skin is warm and dry. No rash noted or new ulcers Psychiatric:  Has normal mood and affect. Behavior is normal.     Assessment & Plan:

## 2016-02-11 ENCOUNTER — Telehealth: Payer: Self-pay | Admitting: Cardiovascular Disease

## 2016-02-11 NOTE — Telephone Encounter (Signed)
Pt's husband notified and this phone note routed to [facility]

## 2016-02-11 NOTE — Telephone Encounter (Signed)
No recent coronary stent placement. OK to hold Plavix 5 days before the procedure.   Alexis Higgins 02/11/2016 1:49 PM

## 2016-02-11 NOTE — Telephone Encounter (Signed)
New message      Talk to the nurse-----husband said he wanted to explain it to the nurse.

## 2016-02-11 NOTE — Telephone Encounter (Signed)
Spoke with pt and her husband. Pt is planning on having kyphoplasty on 02/24/16 by Dr. Jerolyn Center in interventional radiology. Husband states pt would need to hold Plavix 5 days prior to procedure.  I told pt I would contact Dr. Lenor Derrick office to see what was needed. I spoke with [facility] and they are not requesting surgical clearance.  Dr. Jerolyn Center is requesting pt hold Plavix 5 days prior to procedure.

## 2016-02-15 DIAGNOSIS — J449 Chronic obstructive pulmonary disease, unspecified: Secondary | ICD-10-CM | POA: Diagnosis not present

## 2016-02-17 ENCOUNTER — Other Ambulatory Visit: Payer: Self-pay | Admitting: Radiology

## 2016-02-18 ENCOUNTER — Other Ambulatory Visit: Payer: Self-pay | Admitting: Physician Assistant

## 2016-02-18 DIAGNOSIS — J449 Chronic obstructive pulmonary disease, unspecified: Secondary | ICD-10-CM | POA: Diagnosis not present

## 2016-02-19 ENCOUNTER — Ambulatory Visit (HOSPITAL_COMMUNITY)
Admission: RE | Admit: 2016-02-19 | Discharge: 2016-02-19 | Disposition: A | Discharge status: Home / Self Care | Payer: Medicare Other | Source: Ambulatory Visit | Attending: Interventional Radiology | Admitting: Interventional Radiology

## 2016-02-19 ENCOUNTER — Encounter (HOSPITAL_COMMUNITY): Payer: Self-pay

## 2016-02-19 ENCOUNTER — Other Ambulatory Visit (HOSPITAL_COMMUNITY): Payer: Self-pay | Admitting: Interventional Radiology

## 2016-02-19 DIAGNOSIS — J449 Chronic obstructive pulmonary disease, unspecified: Secondary | ICD-10-CM | POA: Insufficient documentation

## 2016-02-19 DIAGNOSIS — J9610 Chronic respiratory failure, unspecified whether with hypoxia or hypercapnia: Secondary | ICD-10-CM | POA: Diagnosis not present

## 2016-02-19 DIAGNOSIS — Z7902 Long term (current) use of antithrombotics/antiplatelets: Secondary | ICD-10-CM | POA: Diagnosis not present

## 2016-02-19 DIAGNOSIS — I2510 Atherosclerotic heart disease of native coronary artery without angina pectoris: Secondary | ICD-10-CM | POA: Diagnosis not present

## 2016-02-19 DIAGNOSIS — Z86010 Personal history of colonic polyps: Secondary | ICD-10-CM | POA: Insufficient documentation

## 2016-02-19 DIAGNOSIS — Z87891 Personal history of nicotine dependence: Secondary | ICD-10-CM | POA: Diagnosis not present

## 2016-02-19 DIAGNOSIS — I252 Old myocardial infarction: Secondary | ICD-10-CM | POA: Diagnosis not present

## 2016-02-19 DIAGNOSIS — Z8673 Personal history of transient ischemic attack (TIA), and cerebral infarction without residual deficits: Secondary | ICD-10-CM | POA: Insufficient documentation

## 2016-02-19 DIAGNOSIS — M810 Age-related osteoporosis without current pathological fracture: Secondary | ICD-10-CM | POA: Insufficient documentation

## 2016-02-19 DIAGNOSIS — Z955 Presence of coronary angioplasty implant and graft: Secondary | ICD-10-CM | POA: Insufficient documentation

## 2016-02-19 DIAGNOSIS — Z9981 Dependence on supplemental oxygen: Secondary | ICD-10-CM | POA: Diagnosis not present

## 2016-02-19 DIAGNOSIS — M8448XA Pathological fracture, other site, initial encounter for fracture: Secondary | ICD-10-CM | POA: Diagnosis not present

## 2016-02-19 DIAGNOSIS — F419 Anxiety disorder, unspecified: Secondary | ICD-10-CM | POA: Insufficient documentation

## 2016-02-19 DIAGNOSIS — Z7952 Long term (current) use of systemic steroids: Secondary | ICD-10-CM | POA: Diagnosis not present

## 2016-02-19 DIAGNOSIS — I10 Essential (primary) hypertension: Secondary | ICD-10-CM | POA: Insufficient documentation

## 2016-02-19 DIAGNOSIS — M4854XA Collapsed vertebra, not elsewhere classified, thoracic region, initial encounter for fracture: Secondary | ICD-10-CM | POA: Diagnosis not present

## 2016-02-19 DIAGNOSIS — M1610 Unilateral primary osteoarthritis, unspecified hip: Secondary | ICD-10-CM | POA: Diagnosis not present

## 2016-02-19 DIAGNOSIS — IMO0002 Reserved for concepts with insufficient information to code with codable children: Secondary | ICD-10-CM

## 2016-02-19 DIAGNOSIS — F329 Major depressive disorder, single episode, unspecified: Secondary | ICD-10-CM | POA: Insufficient documentation

## 2016-02-19 DIAGNOSIS — Z8249 Family history of ischemic heart disease and other diseases of the circulatory system: Secondary | ICD-10-CM | POA: Diagnosis not present

## 2016-02-19 HISTORY — PX: IR GENERIC HISTORICAL: IMG1180011

## 2016-02-19 LAB — CBC
HEMATOCRIT: 41.4 % (ref 36.0–46.0)
HEMOGLOBIN: 12.8 g/dL (ref 12.0–15.0)
MCH: 28.4 pg (ref 26.0–34.0)
MCHC: 30.9 g/dL (ref 30.0–36.0)
MCV: 92 fL (ref 78.0–100.0)
Platelets: 209 10*3/uL (ref 150–400)
RBC: 4.5 MIL/uL (ref 3.87–5.11)
RDW: 13.3 % (ref 11.5–15.5)
WBC: 11.4 10*3/uL — ABNORMAL HIGH (ref 4.0–10.5)

## 2016-02-19 LAB — BASIC METABOLIC PANEL
ANION GAP: 6 (ref 5–15)
BUN: 7 mg/dL (ref 6–20)
CALCIUM: 8.8 mg/dL — AB (ref 8.9–10.3)
CHLORIDE: 100 mmol/L — AB (ref 101–111)
CO2: 35 mmol/L — AB (ref 22–32)
Creatinine, Ser: 0.91 mg/dL (ref 0.44–1.00)
GFR calc non Af Amer: >60 mL/min (ref >60)
GLUCOSE: 84 mg/dL (ref 65–99)
Potassium: 3.4 mmol/L — ABNORMAL LOW (ref 3.5–5.1)
Sodium: 141 mmol/L (ref 135–145)

## 2016-02-19 LAB — PROTIME-INR
INR: 0.89
Prothrombin Time: 12 seconds (ref 11.4–15.2)

## 2016-02-19 LAB — APTT: aPTT: 22 seconds — ABNORMAL LOW (ref 24–36)

## 2016-02-19 MED ORDER — GELATIN ABSORBABLE 12-7 MM EX MISC
CUTANEOUS | Status: AC
Start: 2016-02-19 — End: 2016-02-19
  Administered 2016-02-19: 12:00:00
  Filled 2016-02-19: qty 1

## 2016-02-19 MED ORDER — FENTANYL CITRATE (PF) 100 MCG/2ML IJ SOLN
INTRAMUSCULAR | Status: AC | PRN
  Administered 2016-02-19 (×3): 25 ug via INTRAVENOUS

## 2016-02-19 MED ORDER — SODIUM CHLORIDE 0.9 % IV SOLN: INTRAVENOUS | Status: AC

## 2016-02-19 MED ORDER — CEFAZOLIN SODIUM-DEXTROSE 2-4 GM/100ML-% IV SOLN
INTRAVENOUS | Status: AC
  Filled 2016-02-19: qty 100

## 2016-02-19 MED ORDER — BUPIVACAINE HCL (PF) 0.25 % IJ SOLN
INTRAMUSCULAR | Status: AC | PRN
  Administered 2016-02-19: 15 mL

## 2016-02-19 MED ORDER — BUPIVACAINE HCL (PF) 0.25 % IJ SOLN
INTRAMUSCULAR | Status: AC
  Filled 2016-02-19: qty 30

## 2016-02-19 MED ORDER — FENTANYL CITRATE (PF) 100 MCG/2ML IJ SOLN
INTRAMUSCULAR | Status: AC
  Filled 2016-02-19: qty 2

## 2016-02-19 MED ORDER — TOBRAMYCIN SULFATE 1.2 G IJ SOLR
INTRAMUSCULAR | Status: AC
  Administered 2016-02-19: 0.1 g
  Filled 2016-02-19: qty 1.2

## 2016-02-19 MED ORDER — MIDAZOLAM HCL 2 MG/2ML IJ SOLN
INTRAMUSCULAR | Status: AC | PRN
  Administered 2016-02-19 (×2): 1 mg via INTRAVENOUS

## 2016-02-19 MED ORDER — HYDROMORPHONE HCL 1 MG/ML IJ SOLN
INTRAMUSCULAR | Status: AC
  Filled 2016-02-19: qty 1

## 2016-02-19 MED ORDER — MIDAZOLAM HCL 2 MG/2ML IJ SOLN
INTRAMUSCULAR | Status: AC
Start: 2016-02-19 — End: 2016-02-19
  Filled 2016-02-19: qty 2

## 2016-02-19 MED ORDER — SODIUM CHLORIDE 0.9 % IV SOLN
INTRAVENOUS | Status: DC
  Administered 2016-02-19: 09:00:00 via INTRAVENOUS

## 2016-02-19 MED ORDER — CEFAZOLIN SODIUM-DEXTROSE 2-4 GM/100ML-% IV SOLN
2.0000 g | INTRAVENOUS | Status: AC
  Administered 2016-02-19: 2 g via INTRAVENOUS

## 2016-02-19 MED ORDER — IOPAMIDOL (ISOVUE-300) INJECTION 61%
INTRAVENOUS | Status: AC
  Filled 2016-02-19: qty 50

## 2016-02-19 MED ORDER — IOPAMIDOL (ISOVUE-300) INJECTION 61%
INTRAVENOUS | Status: AC
  Administered 2016-02-19: 4 mL
  Filled 2016-02-19: qty 50

## 2016-02-19 NOTE — Sedation Documentation (Signed)
Patient is resting comfortably. 

## 2016-02-19 NOTE — Procedures (Signed)
S/P T 5 VP 

## 2016-02-19 NOTE — H&P (Signed)
Chief Complaint: Patient was seen in consultation today for thoracic compression fracture at the request of [name]  Referring Physician(s): [name]  Supervising Physician: Luanne Bras  Patient Status: Outpatient  History of Present Illness: Alexis Higgins is a 75 y.o. female with back pain related to T5 compression fracture. She has tried multiple pain meds including Prednisone and now Lidocaine patches. She still has pain 6/10 at rest, worse with movement. It also makes her splint a little bit and hurts with deep breaths, which already affects her as she has COPD and is on home O2. She also reports some radiating pain around to her left chest wall under her left breast.  She had MRI confirming that T5 fracture is acute/subacute and has now been referred to Dr. Estanislado Pandy for vertebroplasty. PMHx, meds, labs, imaging reviewed. She has been off her Plavix for the last 5 days as instructed. Has been NPO this am. Husband at bedside. She feels well, no recent fevers, chills, CP, N/V, abd pain, diarrhea, or dysuria.  Past Medical History:  Diagnosis Date  . Abnormal chest x-ray    03/2012: will need OP f/u.  Marland Kitchen Anginal pain ([place])   . ANXIETY 01/01/2007  . BURSITIS, RIGHT HIP 06/04/2009  . CAD (coronary artery disease)    a. BMS to LAD 2010. b. NSTEMI with DES to LAD for ISR 2011. c. Patent stent 03/2012/Imdur added.  . Cataract   . CHEST PAIN-PRECORDIAL 01/15/2009  . Colon polyps    H/o tubular adenoma of colon  . COPD 01/01/2007   a. Chronic resp failure on home O2.  Marland Kitchen DEPRESSION 01/01/2007  . Eczema 01/08/2011  . GROIN PAIN 06/20/2008  . Headache(784.0) 01/01/2007  . Hemorrhoid   . HYPERTENSION 01/01/2007  . Impaired glucose tolerance 01/07/2011  . LOW BACK PAIN 01/01/2007  . Muscle weakness (generalized) 06/04/2009  . OSTEOARTHRITIS, HIP 07/01/2008  . OSTEOPOROSIS 01/01/2007  . Pneumonia 1998  . Rosacea 01/08/2011  . Shortness of breath   . SYNCOPE  01/01/2007  . TIA (transient ischemic attack)   . TRANSIENT ISCHEMIC ATTACK, HX OF 01/01/2007    Past Surgical History:  Procedure Laterality Date  . ABDOMINAL HYSTERECTOMY    . APPENDECTOMY    . BACK SURGERY    . BREAST ENHANCEMENT SURGERY    . COLONOSCOPY  01/25/2002   tubular adenoma,hemorrhoids, hyperplastic  colon polyps  . COLONOSCOPY  02/17/2005   hemorrhoids  . CORONARY ANGIOPLASTY    . CORONARY STENT PLACEMENT    . HIP ARTHROPLASTY Left 10/01/2013   Procedure: ARTHROPLASTY UNIPOLAR   HIP;  Surgeon: Marianna Payment, MD;  Location: [facility];  Service: Orthopedics;  Laterality: Left;  . HIP SURGERY Left    DR XU     PROXIMAL NECK   . LEFT HEART CATHETERIZATION WITH CORONARY ANGIOGRAM N/A 04/13/2012   Procedure: LEFT HEART CATHETERIZATION WITH CORONARY ANGIOGRAM;  Surgeon: Burnell Blanks, MD;  Location: [facility] C[facility] LAB;  Service: Cardiovascular;  Laterality: N/A;  . OOPHORECTOMY     one ovary  . s/p bilat cataract  2010  . sp lumbar disc surgury     [name]    Allergies: Aspirin; Other; and Codeine  Medications: Prior to Admission medications   Medication Sig Start Date End Date Taking? Authorizing Provider  Albuterol Sulfate (PROAIR HFA IN) Inhale 2 puffs into the lungs every 6 (six) hours as needed.   Yes Historical Provider, MD  ascorbic acid (VITAMIN C) 1000 MG  tablet Take 1,000 mg by mouth daily.   Yes Historical Provider, MD  Cholecalciferol (VITAMIN D) 1000 UNITS capsule Take 1,000 Units by mouth every evening.    Yes Historical Provider, MD  docusate sodium (COLACE) 100 MG capsule Take 100 mg by mouth daily as needed. For constipation   Yes Historical Provider, MD  isosorbide mononitrate (IMDUR) 30 MG 24 hr tablet TAKE 0.5 TABLETS (15 MG TOTAL) BY MOUTH DAILY. 12/11/15  Yes Burnell Blanks, MD  lidocaine (LIDODERM) 5 % Place 1 patch onto the skin daily. Remove & Discard patch within 12 hours or as directed by MD 02/05/16  Yes Margette Fast, MD    lovastatin (MEVACOR) 20 MG tablet Take 20 mg by mouth at bedtime.   Yes Historical Provider, MD  nortriptyline (PAMELOR) 10 MG capsule Take 20 mg by mouth at bedtime.  07/24/13  Yes Historical Provider, MD  ondansetron (ZOFRAN) 4 MG tablet Take 1 tablet (4 mg total) by mouth every 8 (eight) hours as needed for nausea or vomiting. 01/06/16  Yes Courteney Lyn Mackuen, MD  polyethylene glycol (MIRALAX / GLYCOLAX) packet Take 17 g by mouth daily as needed for mild constipation.   Yes Historical Provider, MD  polyvinyl alcohol (LIQUIFILM TEARS) 1.4 % ophthalmic solution Place 1 drop into both eyes daily.   Yes Historical Provider, MD  predniSONE (DELTASONE) 10 MG tablet TAKE 1 TABLET (10 MG TOTAL) BY MOUTH DAILY WITH BREAKFAST. 12/11/15  Yes Collene Gobble, MD  tiotropium (SPIRIVA HANDIHALER) 18 MCG inhalation capsule Place 1 capsule (18 mcg total) into inhaler and inhale daily at 6 (six) AM. 01/30/16  Yes Biagio Borg, MD  traMADol (ULTRAM) 50 MG tablet Take 100 mg by mouth every 6 (six) hours as needed for moderate pain.    Yes Historical Provider, MD  vitamin B-12 (CYANOCOBALAMIN) 1000 MCG tablet Take 1,000 mcg by mouth daily.   Yes Historical Provider, MD  albuterol (PROVENTIL) (2.5 MG/3ML) 0.083% nebulizer solution Take 3 mLs (2.5 mg total) by nebulization every 6 (six) hours as needed for wheezing or shortness of breath. 02/10/16   Biagio Borg, MD  clopidogrel (PLAVIX) 75 MG tablet TAKE 1 TABLET (75 MG TOTAL) BY MOUTH DAILY. 12/11/15   Burnell Blanks, MD  nitroGLYCERIN (NITROSTAT) 0.4 MG SL tablet Place 1 tablet (0.4 mg total) under the tongue every 5 (five) minutes as needed for chest pain (up to 3 doses). 04/13/12   Dayna N Dunn, PA-C     Family History  Problem Relation Age of Onset  . Osteoporosis Mother   . Heart disease Sister   . Emphysema Sister   . Seizures Sister     epilepsy  . Cardiomyopathy Sister   . Heart attack Sister     Social History   Social History  . Marital  status: Married    Spouse name: N/A  . Number of children: 4  . Years of education: N/A   Occupational History  . disabled back since 2003    Social History Main Topics  . Smoking status: Former Smoker    Packs/day: 1.50    Years: 51.00    Types: Cigarettes    Quit date: 07/24/2005  . Smokeless tobacco: Never Used  . Alcohol use No  . Drug use: No  . Sexual activity: Not Asked   Other Topics Concern  . None   Social History Narrative  . None     Review of Systems: A 12 point ROS discussed and  pertinent positives are indicated in the HPI above.  All other systems are negative.  Review of Systems  Vital Signs: BP (!) 142/74   Pulse 88   Temp 98.1 F (36.7 C) (Oral)   Resp 18   Ht 5\' 6"  (1.676 m)   Wt 148 lb (67.1 kg)   SpO2 98% Comment: 2 liters  BMI 23.89 kg/m   Physical Exam  Constitutional: She is oriented to person, place, and time. She appears well-developed and well-nourished. No distress.  HENT:  Head: Normocephalic.  Mouth/Throat: Oropharynx is clear and moist.  Neck: Normal range of motion. No tracheal deviation present.  Cardiovascular: Normal rate, regular rhythm and normal heart sounds.   Pulmonary/Chest: Effort normal and breath sounds normal. No respiratory distress. She has no wheezes.  Musculoskeletal:  Tender upper back in the mid thoracic region about the T5 level  Neurological: She is alert and oriented to person, place, and time.  Skin: Skin is warm and dry.  Psychiatric: She has a normal mood and affect. Judgment normal.    Mallampati Score:  MD Evaluation Airway: WNL Heart: WNL Abdomen: WNL Chest/ Lungs: WNL ASA  Classification: 3 Mallampati/Airway Score: One  Imaging: Dg Chest 2 View  Result Date: 02/05/2016 CLINICAL DATA:  Shortness of breath, left chest pain EXAM: CHEST  2 VIEW COMPARISON:  CTA chest dated 01/06/2016 FINDINGS: Lungs are essentially clear. Chronic interstitial markings/emphysematous changes. No pleural  effusion or pneumothorax. Apparent nodular opacity in the left upper lobe corresponds to sclerosis involving the left anterior 1st rib. The heart is normal in size. Mild inferior endplate compression deformity involving a mid thoracic vertebral body, likely T5 when correlating with CT. IMPRESSION: No evidence of acute cardiopulmonary disease. Electronically Signed   By: Julian Hy M.D.   On: 02/05/2016 15:02    Labs:  CBC:  Recent Labs  08/01/15 1050 01/06/16 1120 02/05/16 1450 02/19/16 0823  WBC 13.6* 12.0* 17.3* 11.4*  HGB 13.6 13.0 13.3 12.8  HCT 40.7 41.1 42.2 41.4  PLT 294.0 228 273 209    COAGS:  Recent Labs  02/19/16 0823  INR 0.89  APTT 22*    BMP:  Recent Labs  08/01/15 1050 01/06/16 1120 02/05/16 0856 02/19/16 0823  NA 143 142 142 141  K 3.7 3.3* 3.9 3.4*  CL 100 102 98 100*  CO2 36* 31 34* 35*  GLUCOSE 84 95 88 84  BUN 9 7 11 7   CALCIUM 9.1 9.2 9.1 8.8*  CREATININE 0.82 0.85 0.96 0.91  GFRNONAA  --  >60  --  >60  GFRAA  --  >60  --  >60    LIVER FUNCTION TESTS:  Recent Labs  08/01/15 1050 01/06/16 1120 02/05/16 0856  BILITOT 0.5 0.4 0.4  AST 19 18 13   ALT 14 16 11   ALKPHOS 58 57 78  PROT 6.3 5.7* 6.1  ALBUMIN 3.8 3.4* 3.7    TUMOR MARKERS: No results for input(s): AFPTM, CEA, CA199, CHROMGRNA in the last 8760 hours.  Assessment and Plan: Symptomatic T5 compression fracture. Discussed treatment options including VP/KP procedure vs conservative tx. She has not made much progress with conservative mgmt so far and would be a good candidate for procedure. She and her husband are agreeable to the procedure. Labs reviewed, WBC slightly up due to Prednisone use, no infectious sxs or fever. Risks and Benefits discussed with the patient including, but not limited to education regarding the natural healing process of compression fractures without intervention, bleeding, infection, cement  migration which may cause spinal cord damage,  paralysis, pulmonary embolism or even death. All of the patient's questions were answered, patient is agreeable to proceed. Consent signed and in chart.    Thank you for this interesting consult.  I greatly enjoyed meeting ARI BEITER and look forward to participating in their care.  A copy of this report was sent to the requesting provider on this date.  Electronically Signed: Ascencion Dike 02/19/2016, 9:32 AM   I spent a total of 20 minutes in face to face in clinical consultation, greater than 50% of which was counseling/coordinating care for T5 vertebroplasty

## 2016-02-19 NOTE — Discharge Instructions (Signed)
° °  1.No stooping,bending or lifting more than 10 lbs for 2 weeks. 2.Use Kieth Hartis to ambulate for 2 weeks. 3.RTC in 2 weeks PRN    KYPHOPLASTY/VERTEBROPLASTY DISCHARGE INSTRUCTIONS  Medications: (check all that apply)     Resume all home medications as before procedure.       Resume your (aspirin/Plavix/Coumadin) tomorrow.                 Continue your pain medications as prescribed as needed.  Over the next 3-5 days, decrease your pain medication as tolerated.  Over the counter medications (i.e. Tylenol, ibuprofen, and aleve) may be substituted once severe/moderate pain symptoms have subsided.   Wound Care: - Bandages may be removed the day following your procedure.  You may get your incision wet once bandages are removed.  Bandaids may be used to cover the incisions until scab formation.  Topical ointments are optional.  - If you develop a fever greater than 101 degrees, have increased skin redness at the incision sites or pus-like oozing from incisions occurring within 1 week of the procedure, contact radiology at [phone] or [phone].  - Ice pack to back for 15-20 minutes 2-3 time per day for first 2-3 days post procedure.  The ice will expedite muscle healing and help with the pain from the incisions.   Activity: - Bedrest today with limited activity for 24 hours post procedure.  - No driving for 48 hours.  - Increase your activity as tolerated after bedrest (with assistance if necessary).  - Refrain from any strenuous activity or heavy lifting (greater than 10 lbs.).   Follow up: - Contact radiology at [phone] or [phone] if any questions/concerns.  - A physician assistant from radiology will contact you in approximately 1 week.  - If a biopsy was performed at the time of your procedure, your referring physician should receive the results in usually 2-3 days.

## 2016-02-24 ENCOUNTER — Ambulatory Visit (HOSPITAL_COMMUNITY): Payer: Medicare Other

## 2016-02-26 ENCOUNTER — Telehealth (HOSPITAL_COMMUNITY): Payer: Self-pay

## 2016-02-26 NOTE — Telephone Encounter (Signed)
Pt stated that she did not want to come in for f/u. Told to give [place] a call if things change. Pt agreed. AW

## 2016-03-10 ENCOUNTER — Other Ambulatory Visit: Payer: Self-pay | Admitting: Internal Medicine

## 2016-03-12 ENCOUNTER — Telehealth: Payer: Self-pay | Admitting: Internal Medicine

## 2016-03-12 NOTE — Telephone Encounter (Signed)
Patients September visit with Dr. Jenny Reichmann was denied by insurance because she had her preventative in March.  Her labs were denied as well. The codes need to be changed and resubmitted. Can we get billing to do this?

## 2016-03-16 DIAGNOSIS — J449 Chronic obstructive pulmonary disease, unspecified: Secondary | ICD-10-CM | POA: Diagnosis not present

## 2016-03-19 DIAGNOSIS — J449 Chronic obstructive pulmonary disease, unspecified: Secondary | ICD-10-CM | POA: Diagnosis not present

## 2016-03-24 NOTE — Telephone Encounter (Signed)
Emailed coding for correct [place] charge.

## 2016-03-25 NOTE — Telephone Encounter (Signed)
Please notify patient we are removing the charges for her CPE done 02/10/16, as this was a F/U appt and she had her CPE in March of 2017. I would also recommend scheduling her March 2018 CPE to help avoid this in the future.

## 2016-03-25 NOTE — Telephone Encounter (Signed)
Informed patient

## 2016-03-31 ENCOUNTER — Ambulatory Visit: Payer: Medicare Other | Admitting: Emergency Medicine

## 2016-04-02 DIAGNOSIS — M961 Postlaminectomy syndrome, not elsewhere classified: Secondary | ICD-10-CM | POA: Diagnosis not present

## 2016-04-02 DIAGNOSIS — M47817 Spondylosis without myelopathy or radiculopathy, lumbosacral region: Secondary | ICD-10-CM | POA: Diagnosis not present

## 2016-04-02 DIAGNOSIS — G4700 Insomnia, unspecified: Secondary | ICD-10-CM | POA: Diagnosis not present

## 2016-04-02 DIAGNOSIS — G894 Chronic pain syndrome: Secondary | ICD-10-CM | POA: Diagnosis not present

## 2016-04-08 NOTE — Addendum Note (Signed)
Addended by: Della Goo C on: 04/08/2016 08:28 AM   Modules accepted: Orders

## 2016-04-16 DIAGNOSIS — J449 Chronic obstructive pulmonary disease, unspecified: Secondary | ICD-10-CM | POA: Diagnosis not present

## 2016-04-19 DIAGNOSIS — J449 Chronic obstructive pulmonary disease, unspecified: Secondary | ICD-10-CM | POA: Diagnosis not present

## 2016-05-06 ENCOUNTER — Ambulatory Visit (INDEPENDENT_AMBULATORY_CARE_PROVIDER_SITE_OTHER): Payer: Medicare Other | Admitting: Emergency Medicine

## 2016-05-06 ENCOUNTER — Encounter: Payer: Self-pay | Admitting: Emergency Medicine

## 2016-05-06 DIAGNOSIS — J9610 Chronic respiratory failure, unspecified whether with hypoxia or hypercapnia: Secondary | ICD-10-CM

## 2016-05-06 DIAGNOSIS — J439 Emphysema, unspecified: Secondary | ICD-10-CM | POA: Diagnosis not present

## 2016-05-06 NOTE — Progress Notes (Signed)
   Subjective:    Patient ID: Alexis Higgins, female    DOB: 03/06/1941, 75 y.o.   MRN: [id number]  HPI 75 year old woman, former smoker (75 pack years) with a history of CAD, hypertension, cerebrovascular disease and COPD that has been followed by Dr Gwenette Greet.    PFT performed in August 2014 that I personally reviewed. These showed very severe obstruction with an FEV1 of 0.69 L or 32% of predicted, and a positive bronchodilator response.     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 Her bronchodilator therapy has been quite complicated due to side effects. She is currently using the Spiriva HandiHaler but I am concerned about the effectiveness of delivery. Unfortunately she poorly tolerates nebulizers due to agitation. Since our last visit she was having more dyspnea and increase her prednisone with a brief burst and then taper down to 20 mg daily. Her breathing has benefited from this.   ROV 05/06/16 -- This is a follow-up visit for severe COPD. She is prednisone and oxygen dependent. She is currently managed on Spiriva, prednisone 20 mg daily, oxygen at all times. Her breathing is a bit better than last visit. She still has trouble with rushing, with anxiety.                                                                                        Review of Systems  As per HPI.   Objective:   Physical Exam Vitals:   05/06/16 1409  BP: 122/80  Pulse: 100  SpO2: 95%  Weight: 154 lb 9.6 oz (70.1 kg)  Height: 5\' 6"  (1.676 m)   Gen: Pleasant, thin elderly woman, in no distress,  normal affect, no distress on 2 L/m  ENT: No lesions,  mouth clear,  oropharynx clear, no postnasal drip  Neck: No JVD, no stridor  Lungs: No use of accessory muscles,  B soft exp wheezes.   Cardiovascular: RRR, heart sounds normal, no murmur or gallops, no peripheral edema  Musculoskeletal: No deformities, no cyanosis or  clubbing  Neuro: alert, non focal  Skin: Warm, no lesions or rashes   Assessment & Plan:  COPD (chronic obstructive pulmonary disease) with emphysema Please continue your spiriva as you are taking it Continue your oxygen at all time. We will write a prescription for a simple O2 mask for you to use 2L/min while sleeping.  Continue your prednisone 20mg   Flu shot up to date.  Follow with Dr Lamonte Sakai in 4 months or sooner if you have any problems.   Baltazar Apo, MD, PhD 05/06/2016, 2:50 PM [facility] [phone] or if no answer [phone]

## 2016-05-06 NOTE — Patient Instructions (Addendum)
Please continue your spiriva as you are taking it Continue your oxygen at all time. We will write a prescription for a simple O2 mask for you to use 2L/min while sleeping.  Continue your prednisone 20mg   Flu shot up to date.  Follow with Dr Lamonte Sakai in 4 months or sooner if you have any problems.

## 2016-05-06 NOTE — Assessment & Plan Note (Signed)
Please continue your spiriva as you are taking it Continue your oxygen at all time. We will write a prescription for a simple O2 mask for you to use 2L/min while sleeping.  Continue your prednisone 20mg   Flu shot up to date.  Follow with Dr Lamonte Sakai in 4 months or sooner if you have any problems.

## 2016-05-11 ENCOUNTER — Other Ambulatory Visit: Payer: Self-pay | Admitting: Emergency Medicine

## 2016-05-16 DIAGNOSIS — J449 Chronic obstructive pulmonary disease, unspecified: Secondary | ICD-10-CM | POA: Diagnosis not present

## 2016-05-19 DIAGNOSIS — J449 Chronic obstructive pulmonary disease, unspecified: Secondary | ICD-10-CM | POA: Diagnosis not present

## 2016-05-26 ENCOUNTER — Other Ambulatory Visit: Payer: Self-pay | Admitting: Internal Medicine

## 2016-05-26 ENCOUNTER — Telehealth: Payer: Self-pay | Admitting: Internal Medicine

## 2016-05-26 MED ORDER — TIOTROPIUM BROMIDE MONOHYDRATE 18 MCG IN CAPS: 18.0000 ug | ORAL_CAPSULE | Freq: Every day | RESPIRATORY_TRACT | 3 refills | Status: DC

## 2016-05-26 NOTE — Telephone Encounter (Signed)
Filled out and placed on MD desk for signature. Will call patient once it is ready

## 2016-05-26 NOTE — Telephone Encounter (Signed)
Pt husband brought paperwork up to the office about her Spiriva. He brought it all in a folder and needs it filled out and called them to pick it up when it is completed. Gave to [place] to get to the correct place

## 2016-06-11 ENCOUNTER — Encounter: Payer: Self-pay | Admitting: Internal Medicine

## 2016-06-14 NOTE — Telephone Encounter (Signed)
Please call pt, ok for OV next available

## 2016-06-15 ENCOUNTER — Encounter: Payer: Self-pay | Admitting: Internal Medicine

## 2016-06-15 ENCOUNTER — Ambulatory Visit (INDEPENDENT_AMBULATORY_CARE_PROVIDER_SITE_OTHER): Payer: Medicare Other | Admitting: Internal Medicine

## 2016-06-15 VITALS — BP 138/78 | HR 100 | Temp 98.4°F | Resp 20 | Wt 158.0 lb

## 2016-06-15 DIAGNOSIS — I10 Essential (primary) hypertension: Secondary | ICD-10-CM | POA: Diagnosis not present

## 2016-06-15 DIAGNOSIS — F411 Generalized anxiety disorder: Secondary | ICD-10-CM

## 2016-06-15 DIAGNOSIS — R600 Localized edema: Secondary | ICD-10-CM | POA: Insufficient documentation

## 2016-06-15 DIAGNOSIS — M545 Low back pain: Secondary | ICD-10-CM

## 2016-06-15 DIAGNOSIS — Z0001 Encounter for general adult medical examination with abnormal findings: Secondary | ICD-10-CM

## 2016-06-15 DIAGNOSIS — R739 Hyperglycemia, unspecified: Secondary | ICD-10-CM | POA: Diagnosis not present

## 2016-06-15 DIAGNOSIS — R609 Edema, unspecified: Secondary | ICD-10-CM | POA: Diagnosis not present

## 2016-06-15 MED ORDER — FUROSEMIDE 20 MG PO TABS: 20.0000 mg | ORAL_TABLET | Freq: Every day | ORAL | 11 refills | Status: DC

## 2016-06-15 MED ORDER — CLONAZEPAM 0.5 MG PO TABS: 0.5000 mg | ORAL_TABLET | Freq: Two times a day (BID) | ORAL | 2 refills | Status: DC | PRN

## 2016-06-15 NOTE — Patient Instructions (Addendum)
Please take all new medication as prescribed  - the lasix fluid pill at 20 mg per day, usually in the AM, and the klonopin for nerves  Please check your weight every AM, write down and bring next visit  Your goal is for 10 lbs wt loss over the next few weeks  If the swelling is gone to the legs, you can start taking the lasix only if needed for swelling trying to come back  You will be contacted regarding the referral for: Echocardiogram  Please continue all other medications as before, and refills have been done if requested.  Please have the pharmacy call with any other refills you may need.  Please keep your appointments with your specialists as you may have planned

## 2016-06-15 NOTE — Progress Notes (Signed)
Pre visit review using our clinic review tool, if applicable. No additional management support is needed unless otherwise documented below in the visit note. 

## 2016-06-15 NOTE — Progress Notes (Signed)
Subjective:    Patient ID: Alexis Higgins, female    DOB: 1940/10/16, 76 y.o.   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  She is prednisone and oxygen dependent.  Has gained wt due to steroid.Pt denies chest pain, increased sob or doe, wheezing, orthopnea, PND, palpitations, dizziness or syncope.   Wt Readings from Last 3 Encounters:  06/15/16 158 lb (71.7 kg)  05/06/16 154 lb 9.6 oz (70.1 kg)  02/19/16 148 lb (67.1 kg)  Denies worsening depressive symptoms, suicidal ideation, or panic; but has increassing anxiety.   Pt denies fever, wt loss, night sweats, loss of appetite, or other constitutional symptoms  No other new history.  Chronic pain stable, asks for tramadol refill. Past Medical History:  Diagnosis Date  . Abnormal chest x-ray    03/2012: will need OP f/u.  Marland Kitchen Anginal pain ([place])   . ANXIETY 01/01/2007  . BURSITIS, RIGHT HIP 06/04/2009  . CAD (coronary artery disease)    a. BMS to LAD 2010. b. NSTEMI with DES to LAD for ISR 2011. c. Patent stent 03/2012/Imdur added.  . Cataract   . CHEST PAIN-PRECORDIAL 01/15/2009  . Colon polyps    H/o tubular adenoma of colon  . COPD 01/01/2007   a. Chronic resp failure on home O2.  Marland Kitchen DEPRESSION 01/01/2007  . Eczema 01/08/2011  . GROIN PAIN 06/20/2008  . Headache(784.0) 01/01/2007  . Hemorrhoid   . HYPERTENSION 01/01/2007  . Impaired glucose tolerance 01/07/2011  . LOW BACK PAIN 01/01/2007  . Muscle weakness (generalized) 06/04/2009  . OSTEOARTHRITIS, HIP 07/01/2008  . OSTEOPOROSIS 01/01/2007  . Pneumonia 1998  . Rosacea 01/08/2011  . Shortness of breath   . SYNCOPE 01/01/2007  . TIA (transient ischemic attack)   . TRANSIENT ISCHEMIC ATTACK, HX OF 01/01/2007   Past Surgical History:  Procedure Laterality Date  . ABDOMINAL HYSTERECTOMY    . APPENDECTOMY    . BACK SURGERY    . BREAST ENHANCEMENT  SURGERY    . COLONOSCOPY  01/25/2002   tubular adenoma,hemorrhoids, hyperplastic  colon polyps  . COLONOSCOPY  02/17/2005   hemorrhoids  . CORONARY ANGIOPLASTY    . CORONARY STENT PLACEMENT    . HIP ARTHROPLASTY Left 10/01/2013   Procedure: ARTHROPLASTY UNIPOLAR   HIP;  Surgeon: Marianna Payment, MD;  Location: [place];  Service: Orthopedics;  Laterality: Left;  . HIP SURGERY Left    DR XU     PROXIMAL NECK   . IR GENERIC HISTORICAL  02/19/2016   IR VERTEBROPLASTY CERV/THOR BX INC UNI/BIL INC/INJECT/IMAGING 02/19/2016 Luanne Bras, MD [facility]-INTERV RAD  . LEFT HEART CATHETERIZATION WITH CORONARY ANGIOGRAM N/A 04/13/2012   Procedure: LEFT HEART CATHETERIZATION WITH CORONARY ANGIOGRAM;  Surgeon: Burnell Blanks, MD;  Location: [facility] CATH LAB;  Service: Cardiovascular;  Laterality: N/A;  . OOPHORECTOMY     one ovary  . s/p bilat cataract  2010  . sp lumbar disc surgury     [name]    reports that she quit smoking about 10 years ago. Her smoking use included Cigarettes. She has a 76.50 pack-year smoking history. She has never used smokeless tobacco. She reports that she does not drink alcohol or use drugs. family history includes Cardiomyopathy in her sister; Emphysema in her sister; Heart attack in her sister; Heart disease in her sister; Osteoporosis in her mother; Seizures in  her sister. Allergies  Allergen Reactions  . Aspirin Other (See Comments)     cns bleed risk  . Other Other (See Comments)    Stiloto - severe reaction - caused inability to breath  . Codeine Rash   Current Outpatient Prescriptions on File Prior to Visit  Medication Sig Dispense Refill  . albuterol (PROVENTIL) (2.5 MG/3ML) 0.083% nebulizer solution Take 3 mLs (2.5 mg total) by nebulization every 6 (six) hours as needed for wheezing or shortness of breath. 150 mL 1  . Albuterol Sulfate (PROAIR HFA IN) Inhale 2 puffs into the lungs every 6 (six) hours as needed.    Marland Kitchen ascorbic acid (VITAMIN C) 1000 MG tablet  Take 1,000 mg by mouth daily.    . Cholecalciferol (VITAMIN D) 1000 UNITS capsule Take 1,000 Units by mouth every evening.     . clopidogrel (PLAVIX) 75 MG tablet TAKE 1 TABLET (75 MG TOTAL) BY MOUTH DAILY. 30 tablet 11  . docusate sodium (COLACE) 100 MG capsule Take 100 mg by mouth daily as needed. For constipation    . isosorbide mononitrate (IMDUR) 30 MG 24 hr tablet TAKE 0.5 TABLETS (15 MG TOTAL) BY MOUTH DAILY. 45 tablet 3  . lidocaine (LIDODERM) 5 % Place 1 patch onto the skin daily. Remove & Discard patch within 12 hours or as directed by MD 30 patch 0  . lovastatin (MEVACOR) 20 MG tablet TAKE 1 TABLET BY MOUTH AT BEDTIME 90 tablet 3  . metoprolol tartrate (LOPRESSOR) 25 MG tablet TAKE 1/2 TABLET BY MOUTH 2 TIMES A DAY  11  . nitroGLYCERIN (NITROSTAT) 0.4 MG SL tablet Place 1 tablet (0.4 mg total) under the tongue every 5 (five) minutes as needed for chest pain (up to 3 doses). 25 tablet 4  . nortriptyline (PAMELOR) 10 MG capsule Take 20 mg by mouth at bedtime.     . ondansetron (ZOFRAN) 4 MG tablet Take 1 tablet (4 mg total) by mouth every 8 (eight) hours as needed for nausea or vomiting. 11 tablet 0  . polyethylene glycol (MIRALAX / GLYCOLAX) packet Take 17 g by mouth daily as needed for mild constipation.    . polyvinyl alcohol (LIQUIFILM TEARS) 1.4 % ophthalmic solution Place 1 drop into both eyes daily.    . predniSONE (DELTASONE) 10 MG tablet TAKE 1 TABLET (10 MG TOTAL) BY MOUTH DAILY WITH BREAKFAST. 30 tablet 5  . tiotropium (SPIRIVA HANDIHALER) 18 MCG inhalation capsule Place 1 capsule (18 mcg total) into inhaler and inhale daily at 6 (six) AM. 90 capsule 3  . traMADol (ULTRAM) 50 MG tablet Take 100 mg by mouth every 6 (six) hours as needed for moderate pain.     . vitamin B-12 (CYANOCOBALAMIN) 1000 MCG tablet Take 1,000 mcg by mouth daily.    . predniSONE (DELTASONE) 20 MG tablet     . predniSONE (DELTASONE) 20 MG tablet TAKE 1 TABLET BY MOUTH DAILY WITH BREAKFAST (Patient not  taking: Reported on 06/15/2016) 30 tablet 5   No current facility-administered medications on file prior to visit.    Review of Systems   Constitutional: Negative for unusual diaphoresis or night sweats HENT: Negative for ear swelling or discharge Eyes: Negative for worsening visual haziness  Respiratory: Negative for choking and stridor.   Gastrointestinal: Negative for distension or worsening eructation Genitourinary: Negative for retention or change in urine volume.  Musculoskeletal: Negative for other MSK pain or swelling Skin: Negative for color change and worsening wound Neurological: Negative for tremors and numbness other  than noted  Psychiatric/Behavioral: Negative for decreased concentration or agitation other than above   All other system neg per pt    Objective:   Physical Exam BP 138/78   Pulse 100   Temp 98.4 F (36.9 C) (Oral)   Resp 20   Wt 158 lb (71.7 kg)   SpO2 94%   BMI 25.50 kg/m  VS noted,  Constitutional: Pt appears in no apparent distress HENT: Head: NCAT.  Right Ear: External ear normal.  Left Ear: External ear normal.  Eyes: . Pupils are equal, round, and reactive to light. Conjunctivae and EOM are normal Neck: Normal range of motion. Neck supple.  Cardiovascular: Normal rate and regular rhythm.   Pulmonary/Chest: Effort normal and breath sounds without rales or wheezing.  Abd:  Soft, NT, ND, + BS Neurological: Pt is alert. Not confused , motor grossly intact Skin: Skin is warm. No rash, 2+ bilat ankle LE edema Psychiatric: Pt behavior is normal. No agitation.  No other new exam findings  Lab Results  Component Value Date   WBC 11.4 (H) 02/19/2016   HGB 12.8 02/19/2016   HCT 41.4 02/19/2016   PLT 209 02/19/2016   GLUCOSE 84 02/19/2016   CHOL 207 (H) 02/05/2016   TRIG 276.0 (H) 02/05/2016   HDL 112.20 02/05/2016   LDLDIRECT 49.0 02/05/2016   LDLCALC 57 02/01/2014   ALT 11 02/05/2016   AST 13 02/05/2016   NA 141 02/19/2016   K 3.4 (L)  02/19/2016   CL 100 (L) 02/19/2016   CREATININE 0.91 02/19/2016   BUN 7 02/19/2016   CO2 35 (H) 02/19/2016   TSH 1.49 08/01/2015   INR 0.89 02/19/2016   HGBA1C 5.7 02/05/2016    Transthoracic Echocardiography 01/13/2011 LV EF: 55% -  65% Study Conclusions  - Left ventricle: The cavity size was normal. Wall thickness was normal. Systolic function was normal. The estimated ejection fraction was in the range of 55% to 65%. Wall motion was normal; there were no regional wall motion abnormalities. Left ventricular diastolic function parameters were normal. - Atrial septum: No defect or patent foramen ovale was identified. - Pulmonary arteries: PA peak pressure: 77mm Hg (S).    Assessment & Plan:

## 2016-06-16 DIAGNOSIS — J449 Chronic obstructive pulmonary disease, unspecified: Secondary | ICD-10-CM | POA: Diagnosis not present

## 2016-06-17 ENCOUNTER — Telehealth: Payer: Self-pay | Admitting: *Deleted

## 2016-06-17 NOTE — Telephone Encounter (Signed)
Rec'd call from pt husband stating per pharmacist she should not be taking the clonazepam along w/an opioid. Was told that tramadol is considered an opioid and she has to take twice a day. She has been taking the clonazepam too twice a day at the same time with the tramadol. This morning after take both med pt became very sleep, and not her self. Pt/husband is concern about taking the clonazepam. Pls advise...Alexis Higgins

## 2016-06-17 NOTE — Telephone Encounter (Signed)
Ok to hold off on the tramadol then, and ok to continue with the klonopin

## 2016-06-18 MED ORDER — LORAZEPAM 0.5 MG PO TABS: 0.5000 mg | ORAL_TABLET | Freq: Two times a day (BID) | ORAL | 1 refills | Status: DC | PRN

## 2016-06-18 NOTE — Telephone Encounter (Signed)
MD has responded back to pt through his mychart msg...Alexis Higgins

## 2016-06-18 NOTE — Telephone Encounter (Signed)
I am forced to not be able to do phone consultations due to the crushing paperwork and computer documentation I have to do, as is required by my employer and the [place] medical board  I will try to change the klonopin to ativan though this may not work quite as well.  I was hoping to stay with the klonopin as this is more predictable and lasts longer  OK to cont the tramadol with the ativan, as long as sedation is not too much.  Ativan Done hardcopy to [employer]

## 2016-06-18 NOTE — Telephone Encounter (Signed)
faxed

## 2016-06-18 NOTE — Telephone Encounter (Signed)
Notified pt/husband with MD response. Per husband he doesn't think wife need to be without her pain medication that she has been taking since "2003". He insisted that he didn't believe MD said that because he wouldn't have rx the anxiety medication. He stated that he also sent MD a mychart last night with his concerns but haven't receive response back. He is requesting to speak w/Dr. Jenny Reichmann. Inform husband I could make f/u appt to come in to address his concerns. He refuse the appt, and expecting to heat back from MD through his mychart msg or call back...[name]

## 2016-06-19 DIAGNOSIS — J449 Chronic obstructive pulmonary disease, unspecified: Secondary | ICD-10-CM | POA: Diagnosis not present

## 2016-06-21 NOTE — Assessment & Plan Note (Addendum)
Etiology unclear, includes diast CHF and pulm edema, for echo, lasix 20 qd prn,  to f/u any worsening symptoms or concerns  Note:  Total time for pt hx, exam, review of record with pt in the room, determination of diagnoses and plan for further eval and tx is > 40 min, with over 50% spent in coordination and counseling of patient

## 2016-06-21 NOTE — Assessment & Plan Note (Signed)
Stable, for tramadol refill,  to f/u any worsening symptoms or concerns 

## 2016-06-21 NOTE — Assessment & Plan Note (Signed)
Ok for klonopin low dose bid prn,  to f/u any worsening symptoms or concerns

## 2016-06-21 NOTE — Assessment & Plan Note (Signed)
stable overall by history and exam, recent data reviewed with pt, and pt to continue medical treatment as before,  to f/u any worsening symptoms or concerns BP Readings from Last 3 Encounters:  06/15/16 138/78  05/06/16 122/80  02/19/16 (!) 149/84

## 2016-06-30 ENCOUNTER — Emergency Department (HOSPITAL_COMMUNITY)
Admission: EM | Admit: 2016-06-30 | Discharge: 2016-06-30 | Disposition: A | Discharge status: Home / Self Care | Payer: Medicare Other | Attending: Emergency Medicine | Admitting: Emergency Medicine

## 2016-06-30 ENCOUNTER — Encounter (HOSPITAL_COMMUNITY): Payer: Self-pay

## 2016-06-30 ENCOUNTER — Emergency Department (HOSPITAL_COMMUNITY): Payer: Medicare Other

## 2016-06-30 DIAGNOSIS — S299XXA Unspecified injury of thorax, initial encounter: Secondary | ICD-10-CM | POA: Diagnosis present

## 2016-06-30 DIAGNOSIS — Z87891 Personal history of nicotine dependence: Secondary | ICD-10-CM | POA: Insufficient documentation

## 2016-06-30 DIAGNOSIS — Z955 Presence of coronary angioplasty implant and graft: Secondary | ICD-10-CM | POA: Insufficient documentation

## 2016-06-30 DIAGNOSIS — S22070A Wedge compression fracture of T9-T10 vertebra, initial encounter for closed fracture: Secondary | ICD-10-CM | POA: Diagnosis not present

## 2016-06-30 DIAGNOSIS — I10 Essential (primary) hypertension: Secondary | ICD-10-CM | POA: Insufficient documentation

## 2016-06-30 DIAGNOSIS — Y999 Unspecified external cause status: Secondary | ICD-10-CM | POA: Diagnosis not present

## 2016-06-30 DIAGNOSIS — Z7902 Long term (current) use of antithrombotics/antiplatelets: Secondary | ICD-10-CM | POA: Diagnosis not present

## 2016-06-30 DIAGNOSIS — Z8673 Personal history of transient ischemic attack (TIA), and cerebral infarction without residual deficits: Secondary | ICD-10-CM | POA: Insufficient documentation

## 2016-06-30 DIAGNOSIS — Y939 Activity, unspecified: Secondary | ICD-10-CM | POA: Insufficient documentation

## 2016-06-30 DIAGNOSIS — Z79899 Other long term (current) drug therapy: Secondary | ICD-10-CM | POA: Diagnosis not present

## 2016-06-30 DIAGNOSIS — Y929 Unspecified place or not applicable: Secondary | ICD-10-CM | POA: Insufficient documentation

## 2016-06-30 DIAGNOSIS — J449 Chronic obstructive pulmonary disease, unspecified: Secondary | ICD-10-CM | POA: Diagnosis not present

## 2016-06-30 DIAGNOSIS — S22058A Other fracture of T5-T6 vertebra, initial encounter for closed fracture: Secondary | ICD-10-CM | POA: Diagnosis not present

## 2016-06-30 DIAGNOSIS — M5489 Other dorsalgia: Secondary | ICD-10-CM | POA: Diagnosis not present

## 2016-06-30 DIAGNOSIS — S22078A Other fracture of T9-T10 vertebra, initial encounter for closed fracture: Secondary | ICD-10-CM | POA: Diagnosis not present

## 2016-06-30 DIAGNOSIS — I2510 Atherosclerotic heart disease of native coronary artery without angina pectoris: Secondary | ICD-10-CM | POA: Insufficient documentation

## 2016-06-30 DIAGNOSIS — M546 Pain in thoracic spine: Secondary | ICD-10-CM | POA: Diagnosis not present

## 2016-06-30 DIAGNOSIS — Z96642 Presence of left artificial hip joint: Secondary | ICD-10-CM | POA: Diagnosis not present

## 2016-06-30 DIAGNOSIS — R0789 Other chest pain: Secondary | ICD-10-CM | POA: Diagnosis not present

## 2016-06-30 DIAGNOSIS — S22000A Wedge compression fracture of unspecified thoracic vertebra, initial encounter for closed fracture: Secondary | ICD-10-CM

## 2016-06-30 DIAGNOSIS — X58XXXA Exposure to other specified factors, initial encounter: Secondary | ICD-10-CM | POA: Insufficient documentation

## 2016-06-30 DIAGNOSIS — M549 Dorsalgia, unspecified: Secondary | ICD-10-CM | POA: Diagnosis not present

## 2016-06-30 DIAGNOSIS — S22050A Wedge compression fracture of T5-T6 vertebra, initial encounter for closed fracture: Secondary | ICD-10-CM | POA: Diagnosis not present

## 2016-06-30 LAB — BASIC METABOLIC PANEL
ANION GAP: 11 (ref 5–15)
BUN: 13 mg/dL (ref 6–20)
CALCIUM: 9 mg/dL (ref 8.9–10.3)
CO2: 36 mmol/L — ABNORMAL HIGH (ref 22–32)
Chloride: 94 mmol/L — ABNORMAL LOW (ref 101–111)
Creatinine, Ser: 1.13 mg/dL — ABNORMAL HIGH (ref 0.44–1.00)
GFR, EST AFRICAN AMERICAN: 54 mL/min — AB (ref >60)
GFR, EST NON AFRICAN AMERICAN: 47 mL/min — AB (ref >60)
Glucose, Bld: 218 mg/dL — ABNORMAL HIGH (ref 65–99)
Potassium: 3.2 mmol/L — ABNORMAL LOW (ref 3.5–5.1)
Sodium: 141 mmol/L (ref 135–145)

## 2016-06-30 LAB — I-STAT TROPONIN, ED
TROPONIN I, POC: 0.01 ng/mL (ref 0.00–0.08)
Troponin i, poc: 0.02 ng/mL (ref 0.00–0.08)

## 2016-06-30 LAB — CBC
HCT: 41.1 % (ref 36.0–46.0)
Hemoglobin: 13.1 g/dL (ref 12.0–15.0)
MCH: 28.9 pg (ref 26.0–34.0)
MCHC: 31.9 g/dL (ref 30.0–36.0)
MCV: 90.5 fL (ref 78.0–100.0)
PLATELETS: 254 10*3/uL (ref 150–400)
RBC: 4.54 MIL/uL (ref 3.87–5.11)
RDW: 13.4 % (ref 11.5–15.5)
WBC: 14.6 10*3/uL — AB (ref 4.0–10.5)

## 2016-06-30 MED ORDER — MORPHINE SULFATE (PF) 4 MG/ML IV SOLN
4.0000 mg | Freq: Once | INTRAVENOUS | Status: AC
  Administered 2016-06-30: 4 mg via INTRAVENOUS
  Filled 2016-06-30: qty 1

## 2016-06-30 MED ORDER — LIDOCAINE 5 % EX PTCH
1.0000 | MEDICATED_PATCH | CUTANEOUS | Status: DC
  Administered 2016-06-30: 1 via TRANSDERMAL
  Filled 2016-06-30: qty 1

## 2016-06-30 NOTE — ED Provider Notes (Signed)
[facility] DEPT Provider Note   CSN: [id number] Arrival date & time: 06/30/16  1744     History   Chief Complaint Chief Complaint  Patient presents with  . Rib cage pain    Right    HPI Alexis Higgins is a 76 y.o. female.  HPI Pt noticed some low back pain yesterday but it was not that severe.  Today however the pain migrated up towards her rib cage.  Today the pain increased more and more.  She does not have any midline pain but it is more on the right side.  She tried a lidocaine patch but it has not helped as much as it did the last time. Pain dose increase with movement and deep breathing.  No cough, no shortness of breath.  No fevers.  No hx of PE.  She does have history of heart disease and cardiac stents.  This does not feel like her cardiac pain from stents.  She has a history of osteoporosis and compression fractures.  She is on chronic steroid medications for her COPD.  She is on chronic home o2. Past Medical History:  Diagnosis Date  . Abnormal chest x-ray    03/2012: will need OP f/u.  Marland Kitchen Anginal pain ([place])   . ANXIETY 01/01/2007  . BURSITIS, RIGHT HIP 06/04/2009  . CAD (coronary artery disease)    a. BMS to LAD 2010. b. NSTEMI with DES to LAD for ISR 2011. c. Patent stent 03/2012/Imdur added.  . Cataract   . CHEST PAIN-PRECORDIAL 01/15/2009  . Colon polyps    H/o tubular adenoma of colon  . COPD 01/01/2007   a. Chronic resp failure on home O2.  Marland Kitchen DEPRESSION 01/01/2007  . Eczema 01/08/2011  . GROIN PAIN 06/20/2008  . Headache(784.0) 01/01/2007  . Hemorrhoid   . HYPERTENSION 01/01/2007  . Impaired glucose tolerance 01/07/2011  . LOW BACK PAIN 01/01/2007  . Muscle weakness (generalized) 06/04/2009  . OSTEOARTHRITIS, HIP 07/01/2008  . OSTEOPOROSIS 01/01/2007  . Pneumonia 1998  . Rosacea 01/08/2011  . Shortness of breath   . SYNCOPE 01/01/2007  . TIA (transient ischemic attack)   . TRANSIENT ISCHEMIC ATTACK, HX OF 01/01/2007    Patient Active Problem List   Diagnosis Date Noted  . Peripheral edema 06/15/2016  . Smoker 08/02/2014  . Peripheral vascular disease ([place]) 08/02/2014  . Abdominal pain, LLQ 11/22/2013  . Black stools 11/22/2013  . Heme positive stool 11/22/2013  . Acute blood loss anemia 10/24/2013  . Displaced fracture of left femoral neck ([place]) 09/30/2013  . Fracture of femoral neck, left, closed ([place]) 09/30/2013  . Fall at home 09/30/2013  . Head contusion 09/30/2013  . Leg weakness, bilateral 07/21/2012  . 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 Unstable angina pectoris ([place]) 04/12/2012  . Chronic respiratory failure ([place]) 02/13/2012  . Localized swelling, mass and lump, neck 01/06/2012  . Dyspnea 11/22/2011  . Personal history of colonic polyps 09/06/2011  . Eczema 01/08/2011  . Rosacea 01/08/2011  . Dizziness - light-headed 01/08/2011  . Encounter for long-term (current) use of high-risk medication 01/08/2011  . Impaired glucose tolerance 01/07/2011  . Preventative health care 01/07/2011  . BURSITIS, RIGHT HIP 06/04/2009  . CAD, NATIVE VESSEL 01/15/2009  . Other symptoms involving cardiovascular system 01/15/2009  . CHEST PAIN-PRECORDIAL 01/15/2009  . OSTEOARTHRITIS, HIP 07/01/2008  . Hyperlipidemia 01/01/2007  . Anxiety state 01/01/2007  . DEPRESSION 01/01/2007  . Essential hypertension 01/01/2007  . 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  LOW BACK PAIN 01/01/2007  . OSTEOPOROSIS 01/01/2007  . SYNCOPE 01/01/2007  . Headache(784.0) 01/01/2007  . TRANSIENT ISCHEMIC ATTACK, HX OF 01/01/2007    Past Surgical History:  Procedure Laterality Date  . ABDOMINAL HYSTERECTOMY    . APPENDECTOMY    . BACK SURGERY    . BREAST ENHANCEMENT SURGERY    . COLONOSCOPY  01/25/2002   tubular adenoma,hemorrhoids, hyperplastic  colon polyps  . COLONOSCOPY  02/17/2005   hemorrhoids  . CORONARY ANGIOPLASTY    . CORONARY STENT PLACEMENT    . HIP ARTHROPLASTY Left 10/01/2013   Procedure: ARTHROPLASTY  UNIPOLAR   HIP;  Surgeon: Marianna Payment, MD;  Location: [facility];  Service: Orthopedics;  Laterality: Left;  . HIP SURGERY Left    DR XU     PROXIMAL NECK   . IR GENERIC HISTORICAL  02/19/2016   IR VERTEBROPLASTY CERV/THOR BX INC UNI/BIL INC/INJECT/IMAGING 02/19/2016 Luanne Bras, MD [facility]-INTERV RAD  . LEFT HEART CATHETERIZATION WITH CORONARY ANGIOGRAM N/A 04/13/2012   Procedure: LEFT HEART CATHETERIZATION WITH CORONARY ANGIOGRAM;  Surgeon: Burnell Blanks, MD;  Location[facility] C[facility] LAB;  Service: Cardiovascular;  Laterality: N/A;  . OOPHORECTOMY     one ovary  . s/p bilat cataract  2010  . sp lumbar disc surgury     [name]    OB History    No data available       Home Medications    Prior to Admission medications   Medication Sig Start Date End Date Taking? Authorizing Provider  albuterol (PROVENTIL) (2.5 MG/3ML) 0.083% nebulizer solution Take 3 mLs (2.5 mg total) by nebulization every 6 (six) hours as needed for wheezing or shortness of breath. 02/10/16  Yes Biagio Borg, MD  Albuterol Sulfate (PROAIR HFA IN) Inhale 2 puffs into the lungs every 6 (six) hours as needed (wheezing/shortness of breath).    Yes Historical Provider, MD  ascorbic acid (VITAMIN C) 1000 MG tablet Take 1,000 mg by mouth daily.   Yes Historical Provider, MD  Cholecalciferol (VITAMIN D) 1000 UNITS capsule Take 1,000 Units by mouth every evening.    Yes Historical Provider, MD  clopidogrel (PLAVIX) 75 MG tablet TAKE 1 TABLET (75 MG TOTAL) BY MOUTH DAILY. 12/11/15  Yes Burnell Blanks, MD  docusate sodium (COLACE) 100 MG capsule Take 100 mg by mouth daily as needed. For constipation   Yes Historical Provider, MD  furosemide (LASIX) 20 MG tablet Take 1 tablet (20 mg total) by mouth daily. 06/15/16  Yes Biagio Borg, MD  isosorbide mononitrate (IMDUR) 30 MG 24 hr tablet TAKE 0.5 TABLETS (15 MG TOTAL) BY MOUTH DAILY. 12/11/15  Yes Burnell Blanks, MD  lidocaine (LIDODERM) 5 % Place 1 patch  onto the skin daily. Remove & Discard patch within 12 hours or as directed by MD 02/05/16  Yes Margette Fast, MD  lovastatin (MEVACOR) 20 MG tablet TAKE 1 TABLET BY MOUTH AT BEDTIME 03/10/16  Yes Biagio Borg, MD  metoprolol tartrate (LOPRESSOR) 25 MG tablet TAKE 1/2 TABLET BY MOUTH 2 TIMES A DAY 02/10/16  Yes Historical Provider, MD  nitroGLYCERIN (NITROSTAT) 0.4 MG SL tablet Place 1 tablet (0.4 mg total) under the tongue every 5 (five) minutes as needed for chest pain (up to 3 doses). 04/13/12  Yes Dayna N Dunn, PA-C  nortriptyline (PAMELOR) 10 MG capsule Take 20 mg by mouth at bedtime.  07/24/13  Yes Historical Provider, MD  ondansetron (ZOFRAN) 4 MG tablet Take 1 tablet (4 mg  total) by mouth every 8 (eight) hours as needed for nausea or vomiting. 01/06/16  Yes Courteney Lyn Mackuen, MD  polyethylene glycol (MIRALAX / GLYCOLAX) packet Take 17 g by mouth daily as needed for mild constipation.   Yes Historical Provider, MD  polyvinyl alcohol (LIQUIFILM TEARS) 1.4 % ophthalmic solution Place 1 drop into both eyes daily.   Yes Historical Provider, MD  predniSONE (DELTASONE) 20 MG tablet TAKE 1 TABLET BY MOUTH DAILY WITH BREAKFAST 05/11/16  Yes Collene Gobble, MD  tiotropium (SPIRIVA HANDIHALER) 18 MCG inhalation capsule Place 1 capsule (18 mcg total) into inhaler and inhale daily at 6 (six) AM. 05/26/16  Yes Biagio Borg, MD  traMADol (ULTRAM) 50 MG tablet Take 100 mg by mouth every 6 (six) hours as needed for moderate pain.    Yes Historical Provider, MD  vitamin B-12 (CYANOCOBALAMIN) 1000 MCG tablet Take 1,000 mcg by mouth daily.   Yes Historical Provider, MD  clonazePAM (KLONOPIN) 0.5 MG tablet Take 1 tablet (0.5 mg total) by mouth 2 (two) times daily as needed for anxiety. Patient not taking: Reported on 06/30/2016 06/15/16   Biagio Borg, MD  LORazepam (ATIVAN) 0.5 MG tablet Take 1 tablet (0.5 mg total) by mouth 2 (two) times daily as needed for anxiety. Patient not taking: Reported on 06/30/2016 06/18/16    Biagio Borg, MD  predniSONE (DELTASONE) 10 MG tablet TAKE 1 TABLET (10 MG TOTAL) BY MOUTH DAILY WITH BREAKFAST. Patient taking differently: Take 20 mg by mouth daily at breakfast. 12/11/15   Collene Gobble, MD    Family History Family History  Problem Relation Age of Onset  . Osteoporosis Mother   . Heart disease Sister   . Emphysema Sister   . Seizures Sister     epilepsy  . Cardiomyopathy Sister   . Heart attack Sister     Social History Social History  Substance Use Topics  . Smoking status: Former Smoker    Packs/day: 1.50    Years: 51.00    Types: Cigarettes    Quit date: 07/24/2005  . Smokeless tobacco: Never Used  . Alcohol use No     Allergies   Aspirin; Other; and Codeine   Review of Systems Review of Systems  All other systems reviewed and are negative.    Physical Exam Updated Vital Signs BP 160/88 (BP Location: Left Arm)   Pulse 101   Temp 98.1 F (36.7 C) (Oral)   Resp 18   SpO2 100%   Physical Exam  Constitutional: No distress.  Frail, elderly  HENT:  Head: Normocephalic and atraumatic.  Right Ear: External ear normal.  Left Ear: External ear normal.  Eyes: Conjunctivae are normal. Right eye exhibits no discharge. Left eye exhibits no discharge. No scleral icterus.  Neck: Neck supple. No tracheal deviation present.  Cardiovascular: Normal rate, regular rhythm and intact distal pulses.   Pulmonary/Chest: Effort normal and breath sounds normal. No stridor. No respiratory distress. She has no wheezes. She has no rales.  Abdominal: Soft. Bowel sounds are normal. She exhibits no distension. There is no tenderness. There is no rebound and no guarding.  Musculoskeletal: She exhibits no edema or tenderness.  Kyphosis, ttp mid thoracic spine, no edema, no deformity,   Neurological: She is alert. She has normal strength. No cranial nerve deficit (no facial droop, extraocular movements intact, no slurred speech) or sensory deficit. She exhibits normal  muscle tone. She displays no seizure activity. Coordination normal.  Skin: Skin is warm  and dry. No rash noted.  Psychiatric: She has a normal mood and affect.  Nursing note and vitals reviewed.    ED Treatments / Results  Labs (all labs ordered are listed, but only abnormal results are displayed) Labs Reviewed  CBC - Abnormal; Notable for the following:       Result Value   WBC 14.6 (*)    All other components within normal limits  BASIC METABOLIC PANEL - Abnormal; Notable for the following:    Potassium 3.2 (*)    Chloride 94 (*)    CO2 36 (*)    Glucose, Bld 218 (*)    Creatinine, Ser 1.13 (*)    GFR calc non Af Amer 47 (*)    GFR calc Af Amer 54 (*)    All other components within normal limits  I-STAT TROPOININ, ED  I-STAT TROPOININ, ED    EKG  EKG Interpretation  Date/Time:  Wednesday June 30 2016 18:21:47 EST Ventricular Rate:  106 PR Interval:    QRS Duration: 92 QT Interval:  362 QTC Calculation: 481 R Axis:   71 Text Interpretation:  Sinus tachycardia Anteroseptal infarct, old Borderline repolarization abnormality , increased since last tracing Baseline wander in lead(s) V5 V6 Since last tracing rate faster Confirmed by Elise Knobloch  MD-J, Daylani Deblois [id number]) on 06/30/2016 6:52:53 PM       Radiology Dg Chest 2 View  Result Date: 06/30/2016 CLINICAL DATA:  Upper back pain radiating to right side of chest EXAM: CHEST  2 VIEW COMPARISON:  02/05/2016, CT 01/06/2016, MRI 01/06/2016 FINDINGS: There is hyperinflation. No acute infiltrate or effusion. Normal heart size. No pneumothorax. Partially calcified bilateral breast implants. Vertebral augmentation at T5 level. Mild wedge deformity of T6 and T10. IMPRESSION: 1. No radiographic evidence for acute cardiopulmonary abnormality. 2. Interval vertebral augmentation at T5 level. Mild anterior compression of T6 and T10. This appears new, compared with 02/05/2016 radiographs. Electronically Signed   By: Donavan Foil M.D.   On:  06/30/2016 19:26   Dg Thoracic Spine 2 View  Result Date: 06/30/2016 CLINICAL DATA:  Upper back pain EXAM: THORACIC SPINE 2 VIEWS COMPARISON:  Chest x-ray 02/05/2016, MRI 01/06/2016 FINDINGS: Interval vertebral augmentation and T5. Mild anterior wedge compression deformity of T6, new compared with 2017 imaging. Mild anterior wedging of T10. IMPRESSION: New mild anterior compression deformities of T6 and T10. Electronically Signed   By: Donavan Foil M.D.   On: 06/30/2016 19:28    Procedures Procedures (including critical care time)  Medications Ordered in ED Medications  lidocaine (LIDODERM) 5 % 1 patch (1 patch Transdermal Patch Applied 06/30/16 2128)  morphine 4 MG/ML injection 4 mg (4 mg Intravenous Given 06/30/16 1833)  morphine 4 MG/ML injection 4 mg (4 mg Intravenous Given 06/30/16 2022)     Initial Impression / Assessment and Plan / ED Course  I have reviewed the triage vital signs and the nursing notes.  Pertinent labs & imaging results that were available during my care of the patient were reviewed by me and considered in my medical decision making (see chart for details).   the patient presented to the emergency room with complaints of sharp right back and chest pain. She has a history of prior compression fractures and was concerned she was having another episode. Her cardiac workup is reassuring. Serial troponins and EKG are negative for ischemic changes.  Doubt PE, aortic dissection. Patient did have tenderness to palpation thoracic spine. X-ray does show new anterior wedge fractures at T6 and  T10. Patient was treated with pain medications and she requests a lidocaine patch.  Will dc home.  Follow up with PCP  Final Clinical Impressions(s) / ED Diagnoses   Final diagnoses:  Closed compression fracture of thoracic vertebra, initial encounter [facility])    New Prescriptions New Prescriptions   No medications on file     Dorie Rank, MD 06/30/16 2206

## 2016-06-30 NOTE — ED Triage Notes (Signed)
She c/o right upper and lateral back pain which is radiating into right lat. Chest area. It is worse with movement. She denies trauma. She is in no distress; and her husband is with her.

## 2016-06-30 NOTE — ED Triage Notes (Signed)
Per EMS, pt complains of right lateral rib cage pain since yesterday. Pt was standing up, getting out of bed. Pain is worse with movement and deep breathing. Pain is relieved by resting. Pt has tried lidocaine patch with no relief. Pt is on 2L O2 at home.

## 2016-06-30 NOTE — Discharge Instructions (Signed)
Follow-up with your doctor, continue your current medications, return as needed for worsening symptoms

## 2016-07-02 ENCOUNTER — Telehealth: Payer: Self-pay | Admitting: Emergency Medicine

## 2016-07-02 ENCOUNTER — Other Ambulatory Visit (HOSPITAL_COMMUNITY): Payer: Self-pay | Admitting: Interventional Radiology

## 2016-07-02 ENCOUNTER — Other Ambulatory Visit: Payer: Self-pay | Admitting: Emergency Medicine

## 2016-07-02 DIAGNOSIS — S22000A Wedge compression fracture of unspecified thoracic vertebra, initial encounter for closed fracture: Secondary | ICD-10-CM

## 2016-07-02 DIAGNOSIS — M4850XA Collapsed vertebra, not elsewhere classified, site unspecified, initial encounter for fracture: Principal | ICD-10-CM

## 2016-07-02 DIAGNOSIS — IMO0001 Reserved for inherently not codable concepts without codable children: Secondary | ICD-10-CM

## 2016-07-02 NOTE — Telephone Encounter (Signed)
OK to order the MRI thoracic spine with contrast for compression fractures. Please make sure that results are also forwarded to Dr Jenny Reichmann

## 2016-07-02 NOTE — Telephone Encounter (Addendum)
Spoke with pt's husband when the original message was taken. Pt is in need of an MRI of her back. She had an xray done of her T-spine and has compression fractures of T6 and T10. Pt was instructed that she would need to have an MRI of her back in order to see how severe the fractures are. The ED instructed her to contact her PCP for this order but her PCP is requiring her to be seen before they will place the order because Dr. Jenny Reichmann is out of the country. PCP office can't see her until 07/15/16, pt's husband says this is unacceptable. Pt's husband is wanting to know if RB would be willing to order this MRI. I tried to explain to the pt's husband that whoever is wanting the pt to have an MRI needs to be the physician who orders the MRI. Pt's husband would not take this for an answer and demanded that RB be made aware of this. Pt's husband is aware that RB is off this afternoon and will not return until Monday.  RB - would you be willing to order this MRI? Thanks.

## 2016-07-02 NOTE — Telephone Encounter (Signed)
Spoke with pt's husband. He is aware of RB's response. Order has been placed for MRI and BMET. Nothing further was needed.

## 2016-07-06 ENCOUNTER — Ambulatory Visit (HOSPITAL_COMMUNITY)
Admission: RE | Admit: 2016-07-06 | Discharge: 2016-07-06 | Disposition: A | Discharge status: Home / Self Care | Payer: Medicare Other | Source: Ambulatory Visit | Attending: Emergency Medicine | Admitting: Emergency Medicine

## 2016-07-06 ENCOUNTER — Telehealth (HOSPITAL_COMMUNITY): Payer: Self-pay | Admitting: Internal Medicine

## 2016-07-06 DIAGNOSIS — X58XXXA Exposure to other specified factors, initial encounter: Secondary | ICD-10-CM | POA: Diagnosis not present

## 2016-07-06 DIAGNOSIS — S22059A Unspecified fracture of T5-T6 vertebra, initial encounter for closed fracture: Secondary | ICD-10-CM | POA: Insufficient documentation

## 2016-07-06 DIAGNOSIS — S22079A Unspecified fracture of T9-T10 vertebra, initial encounter for closed fracture: Secondary | ICD-10-CM | POA: Diagnosis not present

## 2016-07-06 DIAGNOSIS — M8580 Other specified disorders of bone density and structure, unspecified site: Secondary | ICD-10-CM | POA: Diagnosis not present

## 2016-07-06 DIAGNOSIS — S22000A Wedge compression fracture of unspecified thoracic vertebra, initial encounter for closed fracture: Secondary | ICD-10-CM

## 2016-07-06 DIAGNOSIS — S22070A Wedge compression fracture of T9-T10 vertebra, initial encounter for closed fracture: Secondary | ICD-10-CM | POA: Diagnosis not present

## 2016-07-06 MED ORDER — GADOBENATE DIMEGLUMINE 529 MG/ML IV SOLN
15.0000 mL | Freq: Once | INTRAVENOUS | Status: AC | PRN
  Administered 2016-07-06: 14 mL via INTRAVENOUS

## 2016-07-06 NOTE — Telephone Encounter (Signed)
I spoke with the patient's husband and he voiced that she is having lumbar issues and does not feel that she is needing the echo at this time.

## 2016-07-08 DIAGNOSIS — M961 Postlaminectomy syndrome, not elsewhere classified: Secondary | ICD-10-CM | POA: Diagnosis not present

## 2016-07-08 DIAGNOSIS — G4700 Insomnia, unspecified: Secondary | ICD-10-CM | POA: Diagnosis not present

## 2016-07-08 DIAGNOSIS — G894 Chronic pain syndrome: Secondary | ICD-10-CM | POA: Diagnosis not present

## 2016-07-08 DIAGNOSIS — M47817 Spondylosis without myelopathy or radiculopathy, lumbosacral region: Secondary | ICD-10-CM | POA: Diagnosis not present

## 2016-07-12 NOTE — Telephone Encounter (Signed)
noted 

## 2016-07-13 ENCOUNTER — Telehealth: Payer: Self-pay | Admitting: Cardiovascular Disease

## 2016-07-13 NOTE — Telephone Encounter (Signed)
New message  Pt husband call to tell the nurse the pt had stop her Plavix do to a procedure that she was soon having. I did let pt know that was call a surgical clearance and we do not allow our pts to call in there own clearances. I informed him the office who would be doing the pts procedure would have to call and get the medication clearanced. Pt husband states he was just calling to let Dr. Angelena Form know and this procedure was done last year also. Pt husband stated the office has his number to give him a call back if there are any issues. Please call back to discuss

## 2016-07-13 NOTE — Telephone Encounter (Signed)
Patient's husband just calling to let Dr. Angelena Form know that his wife has been holding her Plavix since this past Sunday and will hold it through Thursday for a procedure and will restart it on Friday after the procedure. This was already approved. See phone note 02/11/16. The husband just wanted to make Dr. Angelena Form aware.

## 2016-07-13 NOTE — Telephone Encounter (Signed)
Thanks!    Chris.

## 2016-07-14 ENCOUNTER — Other Ambulatory Visit: Payer: Self-pay | Admitting: Physician Assistant

## 2016-07-14 ENCOUNTER — Ambulatory Visit (HOSPITAL_COMMUNITY): Payer: Medicare Other

## 2016-07-14 ENCOUNTER — Other Ambulatory Visit: Payer: Self-pay | Admitting: Radiology

## 2016-07-15 ENCOUNTER — Encounter (HOSPITAL_COMMUNITY): Payer: Self-pay

## 2016-07-15 ENCOUNTER — Ambulatory Visit (HOSPITAL_COMMUNITY)
Admission: RE | Admit: 2016-07-15 | Discharge: 2016-07-15 | Disposition: A | Discharge status: Home / Self Care | Payer: Medicare Other | Source: Ambulatory Visit | Attending: Interventional Radiology | Admitting: Interventional Radiology

## 2016-07-15 DIAGNOSIS — L719 Rosacea, unspecified: Secondary | ICD-10-CM | POA: Insufficient documentation

## 2016-07-15 DIAGNOSIS — Z7952 Long term (current) use of systemic steroids: Secondary | ICD-10-CM | POA: Diagnosis not present

## 2016-07-15 DIAGNOSIS — Z87891 Personal history of nicotine dependence: Secondary | ICD-10-CM | POA: Diagnosis not present

## 2016-07-15 DIAGNOSIS — F329 Major depressive disorder, single episode, unspecified: Secondary | ICD-10-CM | POA: Diagnosis not present

## 2016-07-15 DIAGNOSIS — J449 Chronic obstructive pulmonary disease, unspecified: Secondary | ICD-10-CM | POA: Insufficient documentation

## 2016-07-15 DIAGNOSIS — Z8673 Personal history of transient ischemic attack (TIA), and cerebral infarction without residual deficits: Secondary | ICD-10-CM | POA: Diagnosis not present

## 2016-07-15 DIAGNOSIS — I252 Old myocardial infarction: Secondary | ICD-10-CM | POA: Insufficient documentation

## 2016-07-15 DIAGNOSIS — M1610 Unilateral primary osteoarthritis, unspecified hip: Secondary | ICD-10-CM | POA: Diagnosis not present

## 2016-07-15 DIAGNOSIS — I2510 Atherosclerotic heart disease of native coronary artery without angina pectoris: Secondary | ICD-10-CM | POA: Insufficient documentation

## 2016-07-15 DIAGNOSIS — M5124 Other intervertebral disc displacement, thoracic region: Secondary | ICD-10-CM | POA: Insufficient documentation

## 2016-07-15 DIAGNOSIS — Z955 Presence of coronary angioplasty implant and graft: Secondary | ICD-10-CM | POA: Insufficient documentation

## 2016-07-15 DIAGNOSIS — J9610 Chronic respiratory failure, unspecified whether with hypoxia or hypercapnia: Secondary | ICD-10-CM | POA: Diagnosis not present

## 2016-07-15 DIAGNOSIS — M8580 Other specified disorders of bone density and structure, unspecified site: Secondary | ICD-10-CM | POA: Insufficient documentation

## 2016-07-15 DIAGNOSIS — Z9981 Dependence on supplemental oxygen: Secondary | ICD-10-CM | POA: Diagnosis not present

## 2016-07-15 DIAGNOSIS — M4854XA Collapsed vertebra, not elsewhere classified, thoracic region, initial encounter for fracture: Secondary | ICD-10-CM | POA: Diagnosis not present

## 2016-07-15 DIAGNOSIS — Z8249 Family history of ischemic heart disease and other diseases of the circulatory system: Secondary | ICD-10-CM | POA: Insufficient documentation

## 2016-07-15 DIAGNOSIS — I10 Essential (primary) hypertension: Secondary | ICD-10-CM | POA: Insufficient documentation

## 2016-07-15 DIAGNOSIS — Z7902 Long term (current) use of antithrombotics/antiplatelets: Secondary | ICD-10-CM | POA: Diagnosis not present

## 2016-07-15 DIAGNOSIS — M4850XA Collapsed vertebra, not elsewhere classified, site unspecified, initial encounter for fracture: Secondary | ICD-10-CM

## 2016-07-15 DIAGNOSIS — M546 Pain in thoracic spine: Secondary | ICD-10-CM | POA: Diagnosis not present

## 2016-07-15 DIAGNOSIS — IMO0001 Reserved for inherently not codable concepts without codable children: Secondary | ICD-10-CM

## 2016-07-15 DIAGNOSIS — F419 Anxiety disorder, unspecified: Secondary | ICD-10-CM | POA: Insufficient documentation

## 2016-07-15 HISTORY — PX: IR GENERIC HISTORICAL: IMG1180011

## 2016-07-15 LAB — CBC
HCT: 43.1 % (ref 36.0–46.0)
Hemoglobin: 13.8 g/dL (ref 12.0–15.0)
MCH: 29.2 pg (ref 26.0–34.0)
MCHC: 32 g/dL (ref 30.0–36.0)
MCV: 91.1 fL (ref 78.0–100.0)
PLATELETS: 288 10*3/uL (ref 150–400)
RBC: 4.73 MIL/uL (ref 3.87–5.11)
RDW: 13.7 % (ref 11.5–15.5)
WBC: 15 10*3/uL — ABNORMAL HIGH (ref 4.0–10.5)

## 2016-07-15 LAB — APTT: aPTT: 22 seconds — ABNORMAL LOW (ref 24–36)

## 2016-07-15 LAB — PROTIME-INR
INR: 0.85
PROTHROMBIN TIME: 11.6 s (ref 11.4–15.2)

## 2016-07-15 LAB — POCT I-STAT, CHEM 8
BUN: 13 mg/dL (ref 6–20)
CALCIUM ION: 1.23 mmol/L (ref 1.15–1.40)
CREATININE: 1.1 mg/dL — AB (ref 0.44–1.00)
Chloride: 96 mmol/L — ABNORMAL LOW (ref 101–111)
GLUCOSE: 107 mg/dL — AB (ref 65–99)
HEMATOCRIT: 39 % (ref 36.0–46.0)
HEMOGLOBIN: 13.3 g/dL (ref 12.0–15.0)
Potassium: 3.4 mmol/L — ABNORMAL LOW (ref 3.5–5.1)
Sodium: 142 mmol/L (ref 135–145)
TCO2: 35 mmol/L (ref 0–100)

## 2016-07-15 MED ORDER — FENTANYL CITRATE (PF) 100 MCG/2ML IJ SOLN
INTRAMUSCULAR | Status: AC
  Filled 2016-07-15: qty 2

## 2016-07-15 MED ORDER — SODIUM CHLORIDE 0.9 % IV SOLN: INTRAVENOUS | Status: DC

## 2016-07-15 MED ORDER — CEFAZOLIN SODIUM-DEXTROSE 2-4 GM/100ML-% IV SOLN
INTRAVENOUS | Status: AC
  Administered 2016-07-15: 12:00:00
  Filled 2016-07-15: qty 100

## 2016-07-15 MED ORDER — CEFAZOLIN SODIUM-DEXTROSE 2-4 GM/100ML-% IV SOLN: 2.0000 g | Freq: Once | INTRAVENOUS | Status: AC

## 2016-07-15 MED ORDER — MIDAZOLAM HCL 2 MG/2ML IJ SOLN
INTRAMUSCULAR | Status: AC
  Filled 2016-07-15: qty 2

## 2016-07-15 MED ORDER — TOBRAMYCIN SULFATE 1.2 G IJ SOLR
INTRAMUSCULAR | Status: AC
Start: 2016-07-15 — End: 2016-07-15
  Administered 2016-07-15: 1.2 g
  Filled 2016-07-15: qty 1.2

## 2016-07-15 MED ORDER — BUPIVACAINE HCL (PF) 0.25 % IJ SOLN
INTRAMUSCULAR | Status: AC
  Administered 2016-07-15: 30 mL
  Filled 2016-07-15: qty 30

## 2016-07-15 MED ORDER — SODIUM CHLORIDE 0.9 % IV SOLN: INTRAVENOUS | Status: AC

## 2016-07-15 MED ORDER — IOPAMIDOL (ISOVUE-300) INJECTION 61%
INTRAVENOUS | Status: AC
  Administered 2016-07-15: 15 mL
  Filled 2016-07-15: qty 50

## 2016-07-15 MED ORDER — MIDAZOLAM HCL 2 MG/2ML IJ SOLN
INTRAMUSCULAR | Status: AC | PRN
  Administered 2016-07-15 (×2): 1 mg via INTRAVENOUS

## 2016-07-15 MED ORDER — FENTANYL CITRATE (PF) 100 MCG/2ML IJ SOLN
INTRAMUSCULAR | Status: AC | PRN
  Administered 2016-07-15 (×2): 25 ug via INTRAVENOUS

## 2016-07-15 NOTE — Discharge Instructions (Signed)
1.No stooping,bending or lifting more than 10 lbs for 2 weeks. 2.Use Dishawn Bhargava to ambulate. 3.RTC PRN in  2weeks   KYPHOPLASTY/VERTEBROPLASTY DISCHARGE INSTRUCTIONS  Medications: (check all that apply)     Resume all home medications as before procedure.       Resume your Plavix tomorrow.   Continue your pain medications as prescribed as needed.  Over the next 3-5 days, decrease your pain medication as tolerated.  Over the counter medications (i.e. Tylenol, ibuprofen, and aleve) may be substituted once severe/moderate pain symptoms have subsided.   Wound Care: - Bandages may be removed the day following your procedure.  You may get your incision wet once bandages are removed.  Bandaids may be used to cover the incisions until scab formation.  Topical ointments are optional.  - If you develop a fever greater than 101 degrees, have increased skin redness at the incision sites or pus-like oozing from incisions occurring within 1 week of the procedure, contact radiology at [phone] or [phone].  - Ice pack to back for 15-20 minutes 2-3 time per day for first 2-3 days post procedure.  The ice will expedite muscle healing and help with the pain from the incisions.   Activity: - Bedrest today with limited activity for 24 hours post procedure.  - No driving for 48 hours.  - Increase your activity as tolerated after bedrest (with assistance if necessary).  - Refrain from any strenuous activity or heavy lifting (greater than 10 lbs.).   Follow up: - Contact radiology at [phone] or [phone] if any questions/concerns.  - A physician assistant from radiology will contact you in approximately 1 week.  - If a biopsy was performed at the time of your procedure, your referring physician should receive the results in usually 2-3 days.

## 2016-07-15 NOTE — Procedures (Signed)
S/P T 10 balloon KP 

## 2016-07-15 NOTE — Sedation Documentation (Signed)
Patient is resting comfortably. 

## 2016-07-15 NOTE — H&P (Signed)
Chief Complaint: Patient was seen in consultation today for thoracic 10 vertebroplasty/kyphoplasty at the request of Dr Franco Collet  Referring Physician(s): Dr Franco Collet  Supervising Physician: Luanne Bras  Patient Status: [facility] - Out-pt  History of Present Illness: AZANIA LILLIS is a 76 y.o. female   Long Hx COPD On Prednisone daily Hx T5 fx and vertebroplasty with Dr Estanislado Pandy 01/2016 Pt states she developed new pain without injury approx 2-3 weeks ago Medication gives no relief  MR 2/13: IMPRESSION: 1. Acute mild T10 compression fracture. 2. New nonacute moderate T6 compression fracture. Status post T5 cement augmentation for old compression fracture. 3. Generalized osteopenia. Similar abnormal signal and enhancement RIGHT T9 pedicle and facet, most compatible with hemangioma. 4. No neurocompression.  Now scheduled for Thoracic 10 VP/KP  LD Plavix 2/17  Past Medical History:  Diagnosis Date  . Abnormal chest x-ray    03/2012: will need OP f/u.  Marland Kitchen Anginal pain ([place])   . ANXIETY 01/01/2007  . BURSITIS, RIGHT HIP 06/04/2009  . CAD (coronary artery disease)    a. BMS to LAD 2010. b. NSTEMI with DES to LAD for ISR 2011. c. Patent stent 03/2012/Imdur added.  . Cataract   . CHEST PAIN-PRECORDIAL 01/15/2009  . Colon polyps    H/o tubular adenoma of colon  . COPD 01/01/2007   a. Chronic resp failure on home O2.  Marland Kitchen DEPRESSION 01/01/2007  . Eczema 01/08/2011  . GROIN PAIN 06/20/2008  . Headache(784.0) 01/01/2007  . Hemorrhoid   . HYPERTENSION 01/01/2007  . Impaired glucose tolerance 01/07/2011  . LOW BACK PAIN 01/01/2007  . Muscle weakness (generalized) 06/04/2009  . OSTEOARTHRITIS, HIP 07/01/2008  . OSTEOPOROSIS 01/01/2007  . Pneumonia 1998  . Rosacea 01/08/2011  . Shortness of breath   . SYNCOPE 01/01/2007  . TIA (transient ischemic attack)   . TRANSIENT ISCHEMIC ATTACK, HX OF 01/01/2007    Past Surgical History:  Procedure Laterality Date  . ABDOMINAL HYSTERECTOMY     . APPENDECTOMY    . BACK SURGERY    . BREAST ENHANCEMENT SURGERY    . COLONOSCOPY  01/25/2002   tubular adenoma,hemorrhoids, hyperplastic  colon polyps  . COLONOSCOPY  02/17/2005   hemorrhoids  . CORONARY ANGIOPLASTY    . CORONARY STENT PLACEMENT    . HIP ARTHROPLASTY Left 10/01/2013   Procedure: ARTHROPLASTY UNIPOLAR   HIP;  Surgeon: Marianna Payment, MD;  Location: [place];  Service: Orthopedics;  Laterality: Left;  . HIP SURGERY Left    DR XU     PROXIMAL NECK   . IR GENERIC HISTORICAL  02/19/2016   IR VERTEBROPLASTY CERV/THOR BX INC UNI/BIL INC/INJECT/IMAGING 02/19/2016 Luanne Bras, MD [facility]-INTERV RAD  . LEFT HEART CATHETERIZATION WITH CORONARY ANGIOGRAM N/A 04/13/2012   Procedure: LEFT HEART CATHETERIZATION WITH CORONARY ANGIOGRAM;  Surgeon: Burnell Blanks, MD;  Location: [facility] CATH LAB;  Service: Cardiovascular;  Laterality: N/A;  . OOPHORECTOMY     one ovary  . s/p bilat cataract  2010  . sp lumbar disc surgury     [name]    Allergies: Aspirin; Other; and Codeine  Medications: Prior to Admission medications   Medication Sig Start Date End Date Taking? Authorizing Provider  albuterol (PROAIR HFA) 108 (90 Base) MCG/ACT inhaler Inhale 2 puffs into the lungs every 6 (six) hours as needed (wheezing/shortness of breath).    Yes Historical Provider, MD  ascorbic acid (VITAMIN C) 1000 MG tablet Take 1,000 mg by mouth daily.   Yes Historical  Provider, MD  Cholecalciferol (VITAMIN D) 1000 UNITS capsule Take 1,000 Units by mouth every evening.    Yes Historical Provider, MD  clopidogrel (PLAVIX) 75 MG tablet TAKE 1 TABLET (75 MG TOTAL) BY MOUTH DAILY. 12/11/15  Yes Burnell Blanks, MD  docusate sodium (COLACE) 100 MG capsule Take 100 mg by mouth daily as needed. For constipation   Yes Historical Provider, MD  furosemide (LASIX) 20 MG tablet Take 1 tablet (20 mg total) by mouth daily. 06/15/16  Yes Biagio Borg, MD  isosorbide mononitrate (IMDUR) 30 MG 24 hr tablet  TAKE 0.5 TABLETS (15 MG TOTAL) BY MOUTH DAILY. 12/11/15  Yes Burnell Blanks, MD  lidocaine (LIDODERM) 5 % Place 1 patch onto the skin daily. Remove & Discard patch within 12 hours or as directed by MD 02/05/16  Yes Margette Fast, MD  lovastatin (MEVACOR) 20 MG tablet TAKE 1 TABLET BY MOUTH AT BEDTIME 03/10/16  Yes Biagio Borg, MD  metoprolol tartrate (LOPRESSOR) 25 MG tablet TAKE 1/2 TABLET BY MOUTH 2 TIMES A DAY 02/10/16  Yes Historical Provider, MD  nitroGLYCERIN (NITROSTAT) 0.4 MG SL tablet Place 1 tablet (0.4 mg total) under the tongue every 5 (five) minutes as needed for chest pain (up to 3 doses). 04/13/12  Yes Dayna N Dunn, PA-C  nortriptyline (PAMELOR) 10 MG capsule Take 20 mg by mouth at bedtime.  07/24/13  Yes Historical Provider, MD  ondansetron (ZOFRAN) 4 MG tablet Take 1 tablet (4 mg total) by mouth every 8 (eight) hours as needed for nausea or vomiting. 01/06/16  Yes Courteney Lyn Mackuen, MD  predniSONE (DELTASONE) 20 MG tablet TAKE 1 TABLET BY MOUTH DAILY WITH BREAKFAST 05/11/16  Yes Collene Gobble, MD  tiotropium (SPIRIVA HANDIHALER) 18 MCG inhalation capsule Place 1 capsule (18 mcg total) into inhaler and inhale daily at 6 (six) AM. 05/26/16  Yes Biagio Borg, MD  traMADol (ULTRAM) 50 MG tablet Take 100 mg by mouth every 6 (six) hours as needed for moderate pain.    Yes Historical Provider, MD  vitamin B-12 (CYANOCOBALAMIN) 1000 MCG tablet Take 1,000 mcg by mouth daily.   Yes Historical Provider, MD  albuterol (PROVENTIL) (2.5 MG/3ML) 0.083% nebulizer solution Take 3 mLs (2.5 mg total) by nebulization every 6 (six) hours as needed for wheezing or shortness of breath. 02/10/16   Biagio Borg, MD  clonazePAM (KLONOPIN) 0.5 MG tablet Take 1 tablet (0.5 mg total) by mouth 2 (two) times daily as needed for anxiety. Patient not taking: Reported on 06/30/2016 06/15/16   Biagio Borg, MD  LORazepam (ATIVAN) 0.5 MG tablet Take 1 tablet (0.5 mg total) by mouth 2 (two) times daily as needed for  anxiety. Patient not taking: Reported on 06/30/2016 06/18/16   Biagio Borg, MD  polyethylene glycol [facility] / Floria Raveling) packet Take 17 g by mouth daily as needed for mild constipation.    Historical Provider, MD  polyvinyl alcohol (LIQUIFILM TEARS) 1.4 % ophthalmic solution Place 1 drop into both eyes daily as needed.     Historical Provider, MD     Family History  Problem Relation Age of Onset  . Osteoporosis Mother   . Heart disease Sister   . Emphysema Sister   . Seizures Sister     epilepsy  . Cardiomyopathy Sister   . Heart attack Sister     Social History   Social History  . Marital status: Married    Spouse name: N/A  . Number of  children: 4  . Years of education: N/A   Occupational History  . disabled back since 2003    Social History Main Topics  . Smoking status: Former Smoker    Packs/day: 1.50    Years: 51.00    Types: Cigarettes    Quit date: 07/24/2005  . Smokeless tobacco: Never Used  . Alcohol use No  . Drug use: No  . Sexual activity: Not Asked   Other Topics Concern  . None   Social History Narrative  . None    Review of Systems: A 12 point ROS discussed and pertinent positives are indicated in the HPI above.  All other systems are negative.  Review of Systems  Constitutional: Positive for activity change. Negative for appetite change, fatigue and fever.  Respiratory: Positive for shortness of breath.   Gastrointestinal: Negative for abdominal pain.  Musculoskeletal: Positive for back pain and gait problem.  Neurological: Positive for weakness.  Psychiatric/Behavioral: Negative for behavioral problems and confusion.    Vital Signs: BP (!) 151/100   Pulse 96   Temp 98.5 F (36.9 C) (Oral)   Resp 18   Ht 5\' 6"  (1.676 m)   Wt 150 lb (68 kg)   SpO2 97%   BMI 24.21 kg/m   Physical Exam  Constitutional: She is oriented to person, place, and time. She appears well-nourished.  Cardiovascular: Normal rate, regular rhythm and normal heart  sounds.   Pulmonary/Chest: Effort normal and breath sounds normal.  Abdominal: Soft. Bowel sounds are normal.  Musculoskeletal: Normal range of motion.  Mid to low back pain upon palpation No pain elicited at upper back  Neurological: She is alert and oriented to person, place, and time.  Skin: Skin is warm and dry.  Psychiatric: She has a normal mood and affect. Her behavior is normal. Judgment and thought content normal.  Nursing note and vitals reviewed.   Mallampati Score:  MD Evaluation Airway: WNL Heart: WNL Abdomen: WNL Chest/ Lungs: WNL ASA  Classification: 3 Mallampati/Airway Score: One  Imaging: Dg Chest 2 View  Result Date: 06/30/2016 CLINICAL DATA:  Upper back pain radiating to right side of chest EXAM: CHEST  2 VIEW COMPARISON:  02/05/2016, CT 01/06/2016, MRI 01/06/2016 FINDINGS: There is hyperinflation. No acute infiltrate or effusion. Normal heart size. No pneumothorax. Partially calcified bilateral breast implants. Vertebral augmentation at T5 level. Mild wedge deformity of T6 and T10. IMPRESSION: 1. No radiographic evidence for acute cardiopulmonary abnormality. 2. Interval vertebral augmentation at T5 level. Mild anterior compression of T6 and T10. This appears new, compared with 02/05/2016 radiographs. Electronically Signed   By: Donavan Foil M.D.   On: 06/30/2016 19:26   Dg Thoracic Spine 2 View  Result Date: 06/30/2016 CLINICAL DATA:  Upper back pain EXAM: THORACIC SPINE 2 VIEWS COMPARISON:  Chest x-ray 02/05/2016, MRI 01/06/2016 FINDINGS: Interval vertebral augmentation and T5. Mild anterior wedge compression deformity of T6, new compared with 2017 imaging. Mild anterior wedging of T10. IMPRESSION: New mild anterior compression deformities of T6 and T10. Electronically Signed   By: Donavan Foil M.D.   On: 06/30/2016 19:28   Mr Thoracic Spine W Wo Contrast  Result Date: 07/07/2016 CLINICAL DATA:  Follow-up thoracic compression fractures September 2017, sharp  increasing pain since June 28, 2016. EXAM: MRI THORACIC WITHOUT AND WITH CONTRAST TECHNIQUE: Multiplanar and multiecho pulse sequences of the thoracic spine were obtained without and with intravenous contrast. CONTRAST:  78mL MULTIHANCE GADOBENATE DIMEGLUMINE 529 MG/ML IV SOLN COMPARISON:  Thoracic spine radiographs June 30, 2016 and MRI of the thoracic spine January 06, 2016 FINDINGS: MRI THORACIC SPINE FINDINGS ALIGNMENT: Maintenance of the thoracic kyphosis. No malalignment. VERTEBRAE/DISCS: Old T5 moderate compression fracture, status post cement augmentation. New nonacute moderate T6 compression with 30-50% height loss. Acute T10 superior endplate compression fracture with less than 20% height loss, low T1 and bright STIR signal. The remaining thoracic vertebral bodies intact. Generalized bright T1 bone marrow signal compatible with osteopenia. Scattered old Schmorl's nodes. Bright STIR signal and enhancement with intermediate T1 signal RIGHT T9 pedicle is unchanged. Intervertebral disc morphology generally preserved with decreased T2 signal within the discs compatible with desiccation. Multilevel chronic mild discogenic endplate changes. No abnormal osseous or suspicious intradiscal enhancement. CORD: Thoracic spinal cord is normal morphology and signal characteristics to the level of the conus medullaris which terminates at T12-L1. No abnormal cord, leptomeningeal or epidural enhancement. PREVERTEBRAL AND PARASPINAL SOFT TISSUES:  Normal. DISC LEVELS (moderately motion degraded axial sequences limit evaluation): At T5-6 is a small broad-based disc bulge. At T6-7 is a small LEFT central disc protrusion, stable. At T8-9 is a small broad-based central disc protrusion, stable. At T9-10 and T10-11 is a small broad-based disc bulge and mild facet arthropathy without canal stenosis or neural foraminal narrowing. IMPRESSION: 1. Acute mild T10 compression fracture. 2. New nonacute moderate T6 compression  fracture. Status post T5 cement augmentation for old compression fracture. 3. Generalized osteopenia. Similar abnormal signal and enhancement RIGHT T9 pedicle and facet, most compatible with hemangioma. 4. No neurocompression. Electronically Signed   By: Elon Alas M.D.   On: 07/07/2016 02:03    Labs:  CBC:  Recent Labs  02/05/16 1450 02/19/16 0823 06/30/16 1827 07/15/16 1016  WBC 17.3* 11.4* 14.6* 15.0*  HGB 13.3 12.8 13.1 13.8  HCT 42.2 41.4 41.1 43.1  PLT 273 209 254 288    COAGS:  Recent Labs  02/19/16 0823  INR 0.89  APTT 22*    BMP:  Recent Labs  01/06/16 1120 02/05/16 0856 02/19/16 0823 06/30/16 1827  NA 142 142 141 141  K 3.3* 3.9 3.4* 3.2*  CL 102 98 100* 94*  CO2 31 34* 35* 36*  GLUCOSE 95 88 84 218*  BUN 7 11 7 13   CALCIUM 9.2 9.1 8.8* 9.0  CREATININE 0.85 0.96 0.91 1.13*  GFRNONAA >60  --  >60 47*  GFRAA >60  --  >60 54*    LIVER FUNCTION TESTS:  Recent Labs  08/01/15 1050 01/06/16 1120 02/05/16 0856  BILITOT 0.5 0.4 0.4  AST 19 18 13   ALT 14 16 11   ALKPHOS 58 57 78  PROT 6.3 5.7* 6.1  ALBUMIN 3.8 3.4* 3.7    TUMOR MARKERS: No results for input(s): AFPTM, CEA, CA199, CHROMGRNA in the last 8760 hours.  Assessment and Plan:  Painful Thoracic 10 fracture Scheduled for VP/KP Risks and Benefits discussed with the patient including, but not limited to education regarding the natural healing process of compression fractures without intervention, bleeding, infection, cement migration which may cause spinal cord damage, paralysis, pulmonary embolism or even death. All of the patient's questions were answered, patient is agreeable to proceed. Consent signed and in chart.  Thank you for this interesting consult.  I greatly enjoyed meeting BENTLEI WIATR and look forward to participating in their care.  A copy of this report was sent to the requesting provider on this date.  Electronically Signed: Monia Sabal A 07/15/2016, 10:53  AM   I spent a total of  25 Minutes in face to face in clinical consultation, greater than 50% of which was counseling/coordinating care for T 10 VP/KP

## 2016-07-16 ENCOUNTER — Encounter (HOSPITAL_COMMUNITY): Payer: Self-pay | Admitting: Interventional Radiology

## 2016-07-17 DIAGNOSIS — J449 Chronic obstructive pulmonary disease, unspecified: Secondary | ICD-10-CM | POA: Diagnosis not present

## 2016-07-20 ENCOUNTER — Other Ambulatory Visit: Payer: Self-pay | Admitting: Internal Medicine

## 2016-07-20 ENCOUNTER — Encounter: Payer: Self-pay | Admitting: Internal Medicine

## 2016-07-20 DIAGNOSIS — J449 Chronic obstructive pulmonary disease, unspecified: Secondary | ICD-10-CM | POA: Diagnosis not present

## 2016-07-21 ENCOUNTER — Telehealth: Payer: Self-pay | Admitting: Internal Medicine

## 2016-07-21 ENCOUNTER — Telehealth (HOSPITAL_COMMUNITY): Payer: Self-pay

## 2016-07-21 NOTE — Telephone Encounter (Signed)
Pt stated that she is doing great and stated that she does not need to come in for f/u with Dr. Estanislado Higgins. AW

## 2016-07-21 NOTE — Telephone Encounter (Signed)
Please call pt husband back [phone] as soon as possible

## 2016-07-21 NOTE — Telephone Encounter (Signed)
  Dr. Jenny Reichmann,  I have talked to my insurance company, [employer] about the cost of my ProAir HFA 90 MCG Inhaler and have been advised that if you or your nurse would call them the tier level can be reduced to a lower cost tier.  They said for you to call their Provider Line [phone] and ask for a pre-authorization "urgently" and my tier would be changed to two(2) within 2-3 days. This would significantly lower my cost of this medication.  Thanking you in advance,    Routed to Tamara----need help with the pre authorization 'urgency"  Thank you

## 2016-07-22 NOTE — Telephone Encounter (Signed)
Explained pa Process to pt husband.  He would like a call once we hear the out come of this

## 2016-07-27 NOTE — Telephone Encounter (Signed)
PA started. Key: [id number]

## 2016-07-28 NOTE — Telephone Encounter (Signed)
Patient is going to check with his insurance company to see if he can get copay reduced

## 2016-07-28 NOTE — Telephone Encounter (Signed)
The following was sent to me by covermymeds.   This medication is on your plan's list of covered drugs. Prior authorization is not required at this time. If your pharmacy has questions regarding the processing of your prescription, please have them call the [facility] pharmacy help desk at ([phone]. For additional information, the member can contact Member Services by calling the number on the back of their ID Card.   Pt will have to call and request a copay reduction.

## 2016-07-28 NOTE — Telephone Encounter (Signed)
NEW KEY:  Barbie Haggis

## 2016-08-10 ENCOUNTER — Encounter: Payer: Self-pay | Admitting: Internal Medicine

## 2016-08-10 ENCOUNTER — Ambulatory Visit (INDEPENDENT_AMBULATORY_CARE_PROVIDER_SITE_OTHER): Payer: Medicare Other | Admitting: Internal Medicine

## 2016-08-10 VITALS — BP 116/82 | HR 97 | Temp 98.8°F | Ht 66.0 in | Wt 155.0 lb

## 2016-08-10 DIAGNOSIS — R7302 Impaired glucose tolerance (oral): Secondary | ICD-10-CM

## 2016-08-10 DIAGNOSIS — Z0001 Encounter for general adult medical examination with abnormal findings: Secondary | ICD-10-CM | POA: Diagnosis not present

## 2016-08-10 DIAGNOSIS — F32A Depression, unspecified: Secondary | ICD-10-CM

## 2016-08-10 DIAGNOSIS — F329 Major depressive disorder, single episode, unspecified: Secondary | ICD-10-CM | POA: Diagnosis not present

## 2016-08-10 DIAGNOSIS — E785 Hyperlipidemia, unspecified: Secondary | ICD-10-CM | POA: Diagnosis not present

## 2016-08-10 DIAGNOSIS — I10 Essential (primary) hypertension: Secondary | ICD-10-CM

## 2016-08-10 NOTE — Patient Instructions (Signed)
Please continue all other medications as before, and refills have been done if requested.  Please have the pharmacy call with any other refills you may need.  Please continue your efforts at being more active, low cholesterol diet, and weight control..  Please keep your appointments with your specialists as you may have planned  Please return in 6 months, or sooner if needed, with Lab testing done 3-5 days before  

## 2016-08-10 NOTE — Progress Notes (Signed)
Subjective:    Patient ID: Alexis Higgins, female    DOB: 05-27-1940, 76 y.o.   MRN: [id number]  HPI    Here to f/u; overall doing ok,  Pt denies chest pain, increasing sob or doe, wheezing, orthopnea, PND, increased LE swelling, palpitations, dizziness or syncope.  Pt denies new neurological symptoms such as new headache, or facial or extremity weakness or numbness.  Pt denies polydipsia, polyuria, or low sugar episode.   Pt denies new neurological symptoms such as new headache, or facial or extremity weakness or numbness.   Pt states overall good compliance with meds, mostly trying to follow appropriate diet, with wt overall stable,  but little exercise however.  Wt has incresaed depiste lasix.   Wt Readings from Last 3 Encounters:  08/10/16 155 lb (70.3 kg)  07/15/16 150 lb (68 kg)  07/06/16 154 lb (69.9 kg)  s/p mult compression fx in aug 2017 and feb 2017 s/p kyphoplasty x 2.  Denies worsening depressive symptoms, suicidal ideation, or panic, nerves seem controlled recently Past Medical History:  Diagnosis Date  . Abnormal chest x-ray    03/2012: will need OP f/u.  Marland Kitchen Anginal pain ([place])   . ANXIETY 01/01/2007  . BURSITIS, RIGHT HIP 06/04/2009  . CAD (coronary artery disease)    a. BMS to LAD 2010. b. NSTEMI with DES to LAD for ISR 2011. c. Patent stent 03/2012/Imdur added.  . Cataract   . CHEST PAIN-PRECORDIAL 01/15/2009  . Colon polyps    H/o tubular adenoma of colon  . COPD 01/01/2007   a. Chronic resp failure on home O2.  Marland Kitchen DEPRESSION 01/01/2007  . Eczema 01/08/2011  . GROIN PAIN 06/20/2008  . Headache(784.0) 01/01/2007  . Hemorrhoid   . HYPERTENSION 01/01/2007  . Impaired glucose tolerance 01/07/2011  . LOW BACK PAIN 01/01/2007  . Muscle weakness (generalized) 06/04/2009  . OSTEOARTHRITIS, HIP 07/01/2008  . OSTEOPOROSIS 01/01/2007  . Pneumonia 1998  . Rosacea 01/08/2011  . Shortness of breath   . SYNCOPE 01/01/2007  . TIA (transient ischemic attack)   . TRANSIENT ISCHEMIC ATTACK,  HX OF 01/01/2007   Past Surgical History:  Procedure Laterality Date  . ABDOMINAL HYSTERECTOMY    . APPENDECTOMY    . BACK SURGERY    . BREAST ENHANCEMENT SURGERY    . COLONOSCOPY  01/25/2002   tubular adenoma,hemorrhoids, hyperplastic  colon polyps  . COLONOSCOPY  02/17/2005   hemorrhoids  . CORONARY ANGIOPLASTY    . CORONARY STENT PLACEMENT    . HIP ARTHROPLASTY Left 10/01/2013   Procedure: ARTHROPLASTY UNIPOLAR   HIP;  Surgeon: Marianna Payment, MD;  Location: [place];  Service: Orthopedics;  Laterality: Left;  . HIP SURGERY Left    DR XU     PROXIMAL NECK   . IR GENERIC HISTORICAL  02/19/2016   IR VERTEBROPLASTY CERV/THOR BX INC UNI/BIL INC/INJECT/IMAGING 02/19/2016 Luanne Bras, MD [facility]-INTERV RAD  . IR GENERIC HISTORICAL  07/15/2016   IR KYPHO THORACIC WITH BONE BIOPSY 07/15/2016 Luanne Bras, MD [facility]  . LEFT HEART CATHETERIZATION WITH CORONARY ANGIOGRAM N/A 04/13/2012   Procedure: LEFT HEART CATHETERIZATION WITH CORONARY ANGIOGRAM;  Surgeon: Burnell Blanks, MD;  Location: [facility] C[facility] LAB;  Service: Cardiovascular;  Laterality: N/A;  . OOPHORECTOMY     one ovary  . s/p bilat cataract  2010  . sp lumbar disc surgury     [name]    reports that she quit smoking about 11 years ago. Her smoking use included  Cigarettes. She has a 76.50 pack-year smoking history. She has never used smokeless tobacco. She reports that she does not drink alcohol or use drugs. family history includes Cardiomyopathy in her sister; Emphysema in her sister; Heart attack in her sister; Heart disease in her sister; Osteoporosis in her mother; Seizures in her sister. Allergies  Allergen Reactions  . Aspirin Other (See Comments)     cns bleed risk  . Other Other (See Comments)    Stiolto - severe reaction - caused inability to breath  . Codeine Rash   Current Outpatient Prescriptions on File Prior to Visit  Medication Sig Dispense Refill  . ascorbic acid (VITAMIN C) 1000 MG tablet  Take 1,000 mg by mouth daily.    . Cholecalciferol (VITAMIN D) 1000 UNITS capsule Take 1,000 Units by mouth every evening.     . clopidogrel (PLAVIX) 75 MG tablet TAKE 1 TABLET (75 MG TOTAL) BY MOUTH DAILY. 30 tablet 11  . docusate sodium (COLACE) 100 MG capsule Take 100 mg by mouth daily as needed. For constipation    . furosemide (LASIX) 20 MG tablet Take 1 tablet (20 mg total) by mouth daily. 30 tablet 11  . isosorbide mononitrate (IMDUR) 30 MG 24 hr tablet TAKE 0.5 TABLETS (15 MG TOTAL) BY MOUTH DAILY. 45 tablet 3  . lidocaine (LIDODERM) 5 % Place 1 patch onto the skin daily. Remove & Discard patch within 12 hours or as directed by MD 30 patch 0  . lovastatin (MEVACOR) 20 MG tablet TAKE 1 TABLET BY MOUTH AT BEDTIME 90 tablet 3  . metoprolol tartrate (LOPRESSOR) 25 MG tablet TAKE 1/2 TABLET BY MOUTH 2 TIMES A DAY  11  . nitroGLYCERIN (NITROSTAT) 0.4 MG SL tablet Place 1 tablet (0.4 mg total) under the tongue every 5 (five) minutes as needed for chest pain (up to 3 doses). 25 tablet 4  . nortriptyline (PAMELOR) 10 MG capsule Take 20 mg by mouth at bedtime.     . ondansetron (ZOFRAN) 4 MG tablet Take 1 tablet (4 mg total) by mouth every 8 (eight) hours as needed for nausea or vomiting. 11 tablet 0  . polyethylene glycol (MIRALAX / GLYCOLAX) packet Take 17 g by mouth daily as needed for mild constipation.    . polyvinyl alcohol (LIQUIFILM TEARS) 1.4 % ophthalmic solution Place 1 drop into both eyes daily as needed.     . predniSONE (DELTASONE) 20 MG tablet TAKE 1 TABLET BY MOUTH DAILY WITH BREAKFAST 30 tablet 5  . PROAIR HFA 108 (90 Base) MCG/ACT inhaler INHALE 2 PUFFS INTO THE LUNGS EVERY 6 (SIX) HOURS AS NEEDED FOR WHEEZING OR SHORTNESS OF BREATH. 25.5 Inhaler 2  . tiotropium (SPIRIVA HANDIHALER) 18 MCG inhalation capsule Place 1 capsule (18 mcg total) into inhaler and inhale daily at 6 (six) AM. 90 capsule 3  . traMADol (ULTRAM) 50 MG tablet Take 100 mg by mouth every 6 (six) hours as needed for  moderate pain.     . vitamin B-12 (CYANOCOBALAMIN) 1000 MCG tablet Take 1,000 mcg by mouth daily.     No current facility-administered medications on file prior to visit.    Review of Systems  Constitutional: Negative for unusual diaphoresis or night sweats HENT: Negative for ear swelling or discharge Eyes: Negative for worsening visual haziness  Respiratory: Negative for choking and stridor.   Gastrointestinal: Negative for distension or worsening eructation Genitourinary: Negative for retention or change in urine volume.  Musculoskeletal: Negative for other MSK pain or swelling Skin: Negative  for color change and worsening wound Neurological: Negative for tremors and numbness other than noted  Psychiatric/Behavioral: Negative for decreased concentration or agitation other than above   All other system neg per pt    Objective:   Physical Exam BP 116/82   Pulse 97   Temp 98.8 F (37.1 C)   Ht 5\' 6"  (1.676 m)   Wt 155 lb (70.3 kg)   SpO2 97%   BMI 25.02 kg/m  VS noted,  Constitutional: Pt appears in no apparent distress HENT: Head: NCAT.  Right Ear: External ear normal.  Left Ear: External ear normal.  Eyes: . Pupils are equal, round, and reactive to light. Conjunctivae and EOM are normal Neck: Normal range of motion. Neck supple.  Cardiovascular: Normal rate and regular rhythm.   Pulmonary/Chest: Effort normal and breath sounds without rales or wheezing.  Neurological: Pt is alert. Not confused , motor grossly intact Skin: Skin is warm. No rash, trace to 1+ bilat pedal LE edema Psychiatric: Pt behavior is normal. No agitation. not depressed affect No other exam findings    Assessment & Plan:

## 2016-08-10 NOTE — Progress Notes (Signed)
Pre visit review using our clinic review tool, if applicable. No additional management support is needed unless otherwise documented below in the visit note. 

## 2016-08-14 DIAGNOSIS — J449 Chronic obstructive pulmonary disease, unspecified: Secondary | ICD-10-CM | POA: Diagnosis not present

## 2016-08-15 NOTE — Assessment & Plan Note (Signed)
Lab Results  Component Value Date   HGBA1C 5.7 02/05/2016   stable overall by history and exam, recent data reviewed with pt, and pt to continue medical treatment as before,  to f/u any worsening symptoms or concerns

## 2016-08-15 NOTE — Assessment & Plan Note (Signed)
stable overall by history and exam, recent data reviewed with pt, and pt to continue medical treatment as before,  to f/u any worsening symptoms or concerns Lab Results  Component Value Date   LDLCALC 57 02/01/2014

## 2016-08-15 NOTE — Addendum Note (Signed)
Addended by: Biagio Borg on: 08/15/2016 04:01 PM   Modules accepted: Orders

## 2016-08-15 NOTE — Assessment & Plan Note (Signed)
stable overall by history and exam, and pt to continue medical treatment as before,  to f/u any worsening symptoms or concerns Lab Results  Component Value Date   WBC 15.0 (H) 07/15/2016   HGB 13.3 07/15/2016   HCT 39.0 07/15/2016   PLT 288 07/15/2016   GLUCOSE 107 (H) 07/15/2016   CHOL 207 (H) 02/05/2016   TRIG 276.0 (H) 02/05/2016   HDL 112.20 02/05/2016   LDLDIRECT 49.0 02/05/2016   LDLCALC 57 02/01/2014   ALT 11 02/05/2016   AST 13 02/05/2016   NA 142 07/15/2016   K 3.4 (L) 07/15/2016   CL 96 (L) 07/15/2016   CREATININE 1.10 (H) 07/15/2016   BUN 13 07/15/2016   CO2 36 (H) 06/30/2016   TSH 1.49 08/01/2015   INR 0.85 07/15/2016   HGBA1C 5.7 02/05/2016

## 2016-08-15 NOTE — Assessment & Plan Note (Signed)
BP Readings from Last 3 Encounters:  08/10/16 116/82  07/15/16 124/88  06/30/16 160/88   stable overall by history and exam, recent data reviewed with pt, and pt to continue medical treatment as before,  to f/u any worsening symptoms or concerns

## 2016-08-17 DIAGNOSIS — J449 Chronic obstructive pulmonary disease, unspecified: Secondary | ICD-10-CM | POA: Diagnosis not present

## 2016-08-24 ENCOUNTER — Telehealth: Payer: Self-pay | Admitting: Emergency Medicine

## 2016-08-24 DIAGNOSIS — R1310 Dysphagia, unspecified: Secondary | ICD-10-CM

## 2016-08-24 NOTE — Telephone Encounter (Signed)
Spoke with pt and pt's husband, states pt c/o worsening coughing spells after eating- pt's spouse called EMS yesterday after she developed a coughing spell after eating a spaghetti dinner.  Pt's family is concerned about aspiration and is requesting further recs from [facility].  Pt has an appt next Thursday with RB but want to know if anything further needs/can be done between then and now.    RB please advise if anything should be done between then and now.  Thanks.

## 2016-08-26 DIAGNOSIS — R1310 Dysphagia, unspecified: Secondary | ICD-10-CM | POA: Insufficient documentation

## 2016-08-26 NOTE — Telephone Encounter (Signed)
Called and spoke to pt's husband, Alexis Higgins. Alexis Higgins is upset that they did not get a call back the same day. I informed Alexis Higgins that the message was not marked urgent because pt had an isolated event and her cough subsided. Pt was aware previously that if she were to develop any SOB, fever, or chest discomfort to call back. I apologized if there was any miscommunication.   Pt states her cough is nonproductive and intermittent and has not completely resolved. She states the cough started on Monday 4/2 evening after eating spaghetti and states "it might have gone down the wrong way", she was evaluated by EMS and they determined she did not need to go to the ED. Pt is denying an increase in SOB from baseline, denies CP/tightness or any discomfort, denies f/c/s.   Side note -Pt is taking prednisone 20mg  and the pt states she is "swollen" from this - she was weighed at PCP and was 158lb and states she weighed 159lb in December 2017. Pt is requesting these issues be addressed today 08/26/16. Pt has an appt with RB on 4.12.18 at 2pm.    Dr. Lamonte Sakai please advise on pt's cough and prednisone/swelling concern. Thanks.

## 2016-08-26 NOTE — Telephone Encounter (Signed)
Order sent for [facility]

## 2016-08-26 NOTE — Telephone Encounter (Signed)
I spoke with the patient, reviewed her sx. She has a lot of neck and facial swelling, ? Whether this is related to the aspiration. Unclear.   I asked her to decrease her pred to 10mg  qd until I see her next week.  Also told her that we would order a modified barium swallow to eval for dysphagia >> please order this.

## 2016-08-26 NOTE — Telephone Encounter (Signed)
Patient's husband,Edward, is very upset that he has not heard anything back from [place].  I explained Dr. Lamonte Sakai is not in the office and a note has been sent to him. Patient's husband is requesting nurse call him at [phone].

## 2016-08-26 NOTE — Telephone Encounter (Signed)
RB please advise. Thanks.  

## 2016-08-27 ENCOUNTER — Other Ambulatory Visit (HOSPITAL_COMMUNITY): Payer: Self-pay | Admitting: Emergency Medicine

## 2016-08-27 DIAGNOSIS — R1310 Dysphagia, unspecified: Secondary | ICD-10-CM

## 2016-09-02 ENCOUNTER — Encounter: Payer: Self-pay | Admitting: Emergency Medicine

## 2016-09-02 ENCOUNTER — Ambulatory Visit (INDEPENDENT_AMBULATORY_CARE_PROVIDER_SITE_OTHER): Payer: Medicare Other | Admitting: Emergency Medicine

## 2016-09-02 DIAGNOSIS — I2721 Secondary pulmonary arterial hypertension: Secondary | ICD-10-CM

## 2016-09-02 DIAGNOSIS — J9610 Chronic respiratory failure, unspecified whether with hypoxia or hypercapnia: Secondary | ICD-10-CM

## 2016-09-02 DIAGNOSIS — J439 Emphysema, unspecified: Secondary | ICD-10-CM

## 2016-09-02 NOTE — Assessment & Plan Note (Signed)
Walking oximetry today. Will up titrate O2 if indidcated. She lost ground after decrease pred to 10, swelling largely unchanged. Will increase back to 15 and see if this dose better., continue spiriva as ordered. Walking oximetry today .

## 2016-09-02 NOTE — Patient Instructions (Addendum)
Walking oximetry today on 2L/min.  We will increase your prednisone to 15mg  daily to see if this helps you breathing but does not increase your swelling.  We will continue your Spiriva Continue your albuterol 2 puffs as needed for shortness of breath.  Get your swallowing test tomorrow as planned.  Follow with Dr Lamonte Sakai next available.

## 2016-09-02 NOTE — Progress Notes (Signed)
   Subjective:    Patient ID: Alexis Higgins, female    DOB: Mar 07, 1941, 76 y.o.   MRN: [id number]  HPI 76 year old woman, former smoker (75 pack years) with a history of CAD, hypertension, cerebrovascular disease and COPD that has been followed by Dr Alexis Higgins.    PFT performed in August 2014 that I personally reviewed. These showed very severe obstruction with an FEV1 of 0.69 L or 32% of predicted, and a positive bronchodilator response.     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 Her bronchodilator therapy has been quite complicated due to side effects. She is currently using the Spiriva HandiHaler but I am concerned about the effectiveness of delivery. Unfortunately she poorly tolerates nebulizers due to agitation. Since our last visit she was having more dyspnea and increase her prednisone with a brief burst and then taper down to 20 mg daily. Her breathing has benefited from this.   ROV 05/06/16 -- This is a follow-up visit for severe COPD. She is prednisone and oxygen dependent. She is currently managed on Spiriva, prednisone 20 mg daily, oxygen at all times. Her breathing is a bit better than last visit. She still has trouble with rushing, with anxiety.        ROV 09/02/16 -- follow up visit for severe COPD, chronic hypoxemic resp failure on O2 , on 2L/min at all times. Has been having facial swelling on chronic pred 20mg . Also some increased anxiety.  I asked her to decrease to 10mg  last week. she has noticed more dyspnea since decreasing the pred. She also noted some sx dysphagia - I have ordered a MBS, scheduled for tomorrow. She is having LE edema, has been on lasix per Dr Alexis Higgins.    Review of Systems  As per HPI.   Objective:   Physical Exam Vitals:   09/02/16 1405  BP: 118/66  Pulse: 90  SpO2: 95%  Weight: 153 lb (69.4 kg)  Height: 5\' 6"  (1.676 m)   Gen: Pleasant, thin elderly woman, in no distress,   normal affect, no distress on 2 L/m  ENT: No lesions,  mouth clear,  oropharynx clear, no postnasal drip  Neck: No JVD, no stridor  Lungs: No use of accessory muscles,  B soft exp wheezes.   Cardiovascular: RRR, heart sounds normal, no murmur or gallops, no peripheral edema  Musculoskeletal: No deformities, no cyanosis or clubbing  Neuro: alert, non focal  Skin: Warm, no lesions or rashes   Assessment & Plan:  Secondary pulmonary arterial hypertension Suspect that this is a primary source of her edema. Need to insure that she is not hypoxic w exertion. We have discussed TTE before but have never done. She will continue current doiuretics and I will work on optimizing oxygenation.   Chronic respiratory failure Walking oximetry today. Will up titrate O2 if indidcated. She lost ground after decrease pred to 10, swelling largely unchanged. Will increase back to 15 and see if this dose better., continue spiriva as ordered. Walking oximetry today .   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    Baltazar Apo, MD, PhD 09/02/2016, 2:31 PM [facility] [phone] or if no answer [phone]

## 2016-09-02 NOTE — Assessment & Plan Note (Signed)
spiriva Oxygen increase pred back to 15 rov next available.

## 2016-09-02 NOTE — Assessment & Plan Note (Signed)
Suspect that this is a primary source of her edema. Need to insure that she is not hypoxic w exertion. We have discussed TTE before but have never done. She will continue current doiuretics and I will work on optimizing oxygenation.

## 2016-09-03 ENCOUNTER — Other Ambulatory Visit (HOSPITAL_COMMUNITY): Payer: Medicare Other

## 2016-09-03 ENCOUNTER — Ambulatory Visit (HOSPITAL_COMMUNITY): Admission: RE | Admit: 2016-09-03 | Payer: Medicare Other | Source: Ambulatory Visit

## 2016-09-04 DIAGNOSIS — R079 Chest pain, unspecified: Secondary | ICD-10-CM | POA: Diagnosis not present

## 2016-09-06 ENCOUNTER — Telehealth: Payer: Self-pay | Admitting: Emergency Medicine

## 2016-09-06 DIAGNOSIS — S22000A Wedge compression fracture of unspecified thoracic vertebra, initial encounter for closed fracture: Secondary | ICD-10-CM

## 2016-09-06 NOTE — Telephone Encounter (Signed)
Spoke with husband and informed him of PM recommendation, and he stated the wife still wanted the scan, husband stressed again wanting the scan done today. I placed the order, he had no further questions. Nothing further is needed

## 2016-09-06 NOTE — Telephone Encounter (Signed)
Ok to place order for MR Alexis Higgins

## 2016-09-06 NOTE — Telephone Encounter (Signed)
Dr. Vaughan Browner  Please Advise as RB is night float-  Pt's husband called to see if we could place an order for a MRI. He states the pt believes she has a compression fracture and they spoke with Dr. Sallyanne Havers who stated he can preform a kyphoplasty but pt will need the scan. Informed him that he should reach out to pt's pcp but he stated Dr. Jenny Reichmann is not in the office on Mondays and that Dr. Lamonte Sakai has placed this order before. Pt is c/o radiating pain shooting from her back through her rib cage to the left side of her breast, difficult to use left arm. States pain makes it difficult for pt to take a deep breath to be able to take her inhalers. They are requesting to have the scan today due to Dr. Sallyanne Havers will not be available next week.

## 2016-09-07 ENCOUNTER — Ambulatory Visit (HOSPITAL_COMMUNITY)
Admission: RE | Admit: 2016-09-07 | Discharge: 2016-09-07 | Disposition: A | Discharge status: Home / Self Care | Payer: Medicare Other | Source: Ambulatory Visit | Attending: Pulmonary Disease | Admitting: Pulmonary Disease

## 2016-09-07 DIAGNOSIS — Z955 Presence of coronary angioplasty implant and graft: Secondary | ICD-10-CM | POA: Diagnosis not present

## 2016-09-07 DIAGNOSIS — X58XXXA Exposure to other specified factors, initial encounter: Secondary | ICD-10-CM

## 2016-09-07 DIAGNOSIS — R072 Precordial pain: Secondary | ICD-10-CM | POA: Diagnosis not present

## 2016-09-07 DIAGNOSIS — I10 Essential (primary) hypertension: Secondary | ICD-10-CM | POA: Diagnosis not present

## 2016-09-07 DIAGNOSIS — Z79899 Other long term (current) drug therapy: Secondary | ICD-10-CM | POA: Diagnosis not present

## 2016-09-07 DIAGNOSIS — R079 Chest pain, unspecified: Secondary | ICD-10-CM | POA: Diagnosis not present

## 2016-09-07 DIAGNOSIS — E785 Hyperlipidemia, unspecified: Secondary | ICD-10-CM | POA: Diagnosis not present

## 2016-09-07 DIAGNOSIS — Z66 Do not resuscitate: Secondary | ICD-10-CM | POA: Diagnosis not present

## 2016-09-07 DIAGNOSIS — R52 Pain, unspecified: Secondary | ICD-10-CM | POA: Diagnosis not present

## 2016-09-07 DIAGNOSIS — Z87891 Personal history of nicotine dependence: Secondary | ICD-10-CM | POA: Diagnosis not present

## 2016-09-07 DIAGNOSIS — G8929 Other chronic pain: Secondary | ICD-10-CM | POA: Diagnosis not present

## 2016-09-07 DIAGNOSIS — Z7952 Long term (current) use of systemic steroids: Secondary | ICD-10-CM | POA: Diagnosis not present

## 2016-09-07 DIAGNOSIS — S22080A Wedge compression fracture of T11-T12 vertebra, initial encounter for closed fracture: Secondary | ICD-10-CM | POA: Insufficient documentation

## 2016-09-07 DIAGNOSIS — Z9981 Dependence on supplemental oxygen: Secondary | ICD-10-CM | POA: Diagnosis not present

## 2016-09-07 DIAGNOSIS — J439 Emphysema, unspecified: Secondary | ICD-10-CM | POA: Diagnosis not present

## 2016-09-07 DIAGNOSIS — D72829 Elevated white blood cell count, unspecified: Secondary | ICD-10-CM | POA: Diagnosis not present

## 2016-09-07 DIAGNOSIS — E876 Hypokalemia: Secondary | ICD-10-CM | POA: Diagnosis not present

## 2016-09-07 DIAGNOSIS — I2510 Atherosclerotic heart disease of native coronary artery without angina pectoris: Secondary | ICD-10-CM | POA: Diagnosis not present

## 2016-09-07 DIAGNOSIS — S22000A Wedge compression fracture of unspecified thoracic vertebra, initial encounter for closed fracture: Secondary | ICD-10-CM | POA: Diagnosis not present

## 2016-09-07 DIAGNOSIS — M810 Age-related osteoporosis without current pathological fracture: Secondary | ICD-10-CM | POA: Diagnosis not present

## 2016-09-07 DIAGNOSIS — I252 Old myocardial infarction: Secondary | ICD-10-CM | POA: Diagnosis not present

## 2016-09-07 DIAGNOSIS — M5489 Other dorsalgia: Secondary | ICD-10-CM | POA: Diagnosis not present

## 2016-09-07 DIAGNOSIS — M4854XA Collapsed vertebra, not elsewhere classified, thoracic region, initial encounter for fracture: Secondary | ICD-10-CM | POA: Diagnosis not present

## 2016-09-07 DIAGNOSIS — M545 Low back pain: Secondary | ICD-10-CM | POA: Diagnosis not present

## 2016-09-07 DIAGNOSIS — J9611 Chronic respiratory failure with hypoxia: Secondary | ICD-10-CM | POA: Diagnosis not present

## 2016-09-07 DIAGNOSIS — Z8673 Personal history of transient ischemic attack (TIA), and cerebral infarction without residual deficits: Secondary | ICD-10-CM | POA: Diagnosis not present

## 2016-09-07 DIAGNOSIS — Z7902 Long term (current) use of antithrombotics/antiplatelets: Secondary | ICD-10-CM | POA: Diagnosis not present

## 2016-09-07 LAB — POCT I-STAT CREATININE: CREATININE: 1 mg/dL (ref 0.44–1.00)

## 2016-09-07 MED ORDER — GADOBENATE DIMEGLUMINE 529 MG/ML IV SOLN
15.0000 mL | Freq: Once | INTRAVENOUS | Status: AC | PRN
  Administered 2016-09-07: 14 mL via INTRAVENOUS

## 2016-09-08 ENCOUNTER — Other Ambulatory Visit (HOSPITAL_COMMUNITY): Payer: Self-pay | Admitting: Interventional Radiology

## 2016-09-08 ENCOUNTER — Telehealth: Payer: Self-pay | Admitting: Emergency Medicine

## 2016-09-08 ENCOUNTER — Telehealth: Payer: Self-pay | Admitting: Cardiovascular Disease

## 2016-09-08 DIAGNOSIS — IMO0001 Reserved for inherently not codable concepts without codable children: Secondary | ICD-10-CM

## 2016-09-08 DIAGNOSIS — M4850XA Collapsed vertebra, not elsewhere classified, site unspecified, initial encounter for fracture: Principal | ICD-10-CM

## 2016-09-08 NOTE — Telephone Encounter (Signed)
New message     Msg for Dr Angelena Form Pt has another compression fracture and therefore she will be off plavix again for 5 days starting tomorrow.

## 2016-09-08 NOTE — Telephone Encounter (Signed)
Agree. Alexis Higgins

## 2016-09-08 NOTE — Telephone Encounter (Signed)
WILL NOTIFY  DR  MCALHANY./CY

## 2016-09-08 NOTE — Telephone Encounter (Addendum)
ATC pt-due to triage phones still being down, I attempted to call off of my cell phone and block my number. Pt does not accept calls from blocked numbers. Will call back.

## 2016-09-09 ENCOUNTER — Emergency Department (HOSPITAL_COMMUNITY)
Admission: EM | Admit: 2016-09-09 | Discharge: 2016-09-10 | Disposition: A | Discharge status: Home / Self Care | Payer: Medicare Other | Source: Home / Self Care | Attending: Emergency Medicine | Admitting: Emergency Medicine

## 2016-09-09 ENCOUNTER — Encounter (HOSPITAL_COMMUNITY): Payer: Self-pay | Admitting: Emergency Medicine

## 2016-09-09 DIAGNOSIS — I10 Essential (primary) hypertension: Secondary | ICD-10-CM | POA: Insufficient documentation

## 2016-09-09 DIAGNOSIS — J449 Chronic obstructive pulmonary disease, unspecified: Secondary | ICD-10-CM

## 2016-09-09 DIAGNOSIS — Z8673 Personal history of transient ischemic attack (TIA), and cerebral infarction without residual deficits: Secondary | ICD-10-CM | POA: Insufficient documentation

## 2016-09-09 DIAGNOSIS — X58XXXA Exposure to other specified factors, initial encounter: Secondary | ICD-10-CM

## 2016-09-09 DIAGNOSIS — Z955 Presence of coronary angioplasty implant and graft: Secondary | ICD-10-CM

## 2016-09-09 DIAGNOSIS — I2510 Atherosclerotic heart disease of native coronary artery without angina pectoris: Secondary | ICD-10-CM | POA: Insufficient documentation

## 2016-09-09 DIAGNOSIS — S22080A Wedge compression fracture of T11-T12 vertebra, initial encounter for closed fracture: Secondary | ICD-10-CM

## 2016-09-09 DIAGNOSIS — S22088A Other fracture of T11-T12 vertebra, initial encounter for closed fracture: Secondary | ICD-10-CM | POA: Insufficient documentation

## 2016-09-09 DIAGNOSIS — Y939 Activity, unspecified: Secondary | ICD-10-CM

## 2016-09-09 DIAGNOSIS — Y999 Unspecified external cause status: Secondary | ICD-10-CM | POA: Insufficient documentation

## 2016-09-09 DIAGNOSIS — Y929 Unspecified place or not applicable: Secondary | ICD-10-CM

## 2016-09-09 DIAGNOSIS — Z87891 Personal history of nicotine dependence: Secondary | ICD-10-CM | POA: Insufficient documentation

## 2016-09-09 DIAGNOSIS — Z96642 Presence of left artificial hip joint: Secondary | ICD-10-CM | POA: Insufficient documentation

## 2016-09-09 LAB — CBC WITH DIFFERENTIAL/PLATELET
Basophils Absolute: 0 10*3/uL (ref 0.0–0.1)
Basophils Relative: 0 %
Eosinophils Absolute: 0 10*3/uL (ref 0.0–0.7)
Eosinophils Relative: 0 %
HCT: 40.6 % (ref 36.0–46.0)
HEMOGLOBIN: 13.2 g/dL (ref 12.0–15.0)
LYMPHS ABS: 2.2 10*3/uL (ref 0.7–4.0)
LYMPHS PCT: 11 %
MCH: 29.5 pg (ref 26.0–34.0)
MCHC: 32.5 g/dL (ref 30.0–36.0)
MCV: 90.6 fL (ref 78.0–100.0)
Monocytes Absolute: 1 10*3/uL (ref 0.1–1.0)
Monocytes Relative: 5 %
NEUTROS PCT: 84 %
Neutro Abs: 15.9 10*3/uL — ABNORMAL HIGH (ref 1.7–7.7)
Platelets: 233 10*3/uL (ref 150–400)
RBC: 4.48 MIL/uL (ref 3.87–5.11)
RDW: 13.5 % (ref 11.5–15.5)
WBC: 19.1 10*3/uL — AB (ref 4.0–10.5)

## 2016-09-09 LAB — BASIC METABOLIC PANEL
ANION GAP: 13 (ref 5–15)
BUN: 12 mg/dL (ref 6–20)
CHLORIDE: 99 mmol/L — AB (ref 101–111)
CO2: 33 mmol/L — ABNORMAL HIGH (ref 22–32)
Calcium: 9.1 mg/dL (ref 8.9–10.3)
Creatinine, Ser: 1.1 mg/dL — ABNORMAL HIGH (ref 0.44–1.00)
GFR calc Af Amer: 55 mL/min — ABNORMAL LOW (ref >60)
GFR calc non Af Amer: 48 mL/min — ABNORMAL LOW (ref >60)
GLUCOSE: 130 mg/dL — AB (ref 65–99)
POTASSIUM: 3.2 mmol/L — AB (ref 3.5–5.1)
SODIUM: 145 mmol/L (ref 135–145)

## 2016-09-09 MED ORDER — HYDROMORPHONE HCL 2 MG/ML IJ SOLN
1.0000 mg | Freq: Once | INTRAMUSCULAR | Status: AC
  Administered 2016-09-09: 1 mg via INTRAVENOUS
  Filled 2016-09-09: qty 1

## 2016-09-09 MED ORDER — PREDNISONE 10 MG PO TABS: 15.0000 mg | ORAL_TABLET | Freq: Every day | ORAL | 1 refills | Status: DC

## 2016-09-09 MED ORDER — OXYCODONE-ACETAMINOPHEN 5-325 MG PO TABS
1.0000 | ORAL_TABLET | Freq: Once | ORAL | Status: AC
  Administered 2016-09-09: 1 via ORAL
  Filled 2016-09-09: qty 1

## 2016-09-09 MED ORDER — LORAZEPAM 2 MG/ML IJ SOLN
1.0000 mg | Freq: Once | INTRAMUSCULAR | Status: AC
  Administered 2016-09-09: 1 mg via INTRAVENOUS
  Filled 2016-09-09: qty 1

## 2016-09-09 MED ORDER — FENTANYL CITRATE (PF) 100 MCG/2ML IJ SOLN
50.0000 ug | Freq: Once | INTRAMUSCULAR | Status: AC
  Administered 2016-09-09: 50 ug via INTRAVENOUS
  Filled 2016-09-09: qty 2

## 2016-09-09 MED ORDER — FENTANYL CITRATE (PF) 100 MCG/2ML IJ SOLN
50.0000 ug | INTRAMUSCULAR | Status: DC | PRN
  Administered 2016-09-09: 50 ug via INTRAVENOUS
  Filled 2016-09-09: qty 2

## 2016-09-09 MED ORDER — ONDANSETRON HCL 4 MG/2ML IJ SOLN
4.0000 mg | Freq: Once | INTRAMUSCULAR | Status: AC
  Administered 2016-09-09: 4 mg via INTRAVENOUS
  Filled 2016-09-09: qty 2

## 2016-09-09 NOTE — Telephone Encounter (Signed)
Still unable to reach due to phones being down and pt does not accept calls from restricted numbers

## 2016-09-09 NOTE — Telephone Encounter (Signed)
Spoke with the pt's spouse  He is requesting new rx for pred 10 mg  Pt is supposed to take 15 mg daily  Rx was sent and pt aware

## 2016-09-09 NOTE — ED Triage Notes (Signed)
Pt comes from home via EMS with complaints of back pain. Had an MRI two days ago that showed compression fractures.  Supposed to have cement surgery on Tuesday and states she cannot make it.  Able to stand to pivot. A&O x4. Vitals BP 170/90, HR 106, RR 20. Given 100 mcg of fentanyl in route. Pain reportedly went from 10/10 to 6/10 with EMS.

## 2016-09-09 NOTE — ED Provider Notes (Signed)
[facility] DEPT Provider Note   CSN: [id number] Arrival date & time: 09/09/16  2009     History   Chief Complaint No chief complaint on file.   HPI Alexis Higgins is a 76 y.o. female.  Presents for evaluation of central back pain radiating to the bilateral flanks, with recent diagnosis of T11 compression fracture, by MRI, 2 days ago.  She has a planned kyphoplasty for 09/14/2016.  She is using tramadol and lidocaine patches, without relief.  The pain is constant but worse when standing.  She denies fever, cough, nausea or vomiting.  There is been no focal weakness or paresthesias.  There are no other known modifying factors.    HPI  Past Medical History:  Diagnosis Date  . Abnormal chest x-ray    03/2012: will need OP f/u.  Marland Kitchen Anginal pain ([place])   . ANXIETY 01/01/2007  . BURSITIS, RIGHT HIP 06/04/2009  . CAD (coronary artery disease)    a. BMS to LAD 2010. b. NSTEMI with DES to LAD for ISR 2011. c. Patent stent 03/2012/Imdur added.  . Cataract   . CHEST PAIN-PRECORDIAL 01/15/2009  . Colon polyps    H/o tubular adenoma of colon  . COPD 01/01/2007   a. Chronic resp failure on home O2.  Marland Kitchen DEPRESSION 01/01/2007  . Eczema 01/08/2011  . GROIN PAIN 06/20/2008  . Headache(784.0) 01/01/2007  . Hemorrhoid   . HYPERTENSION 01/01/2007  . Impaired glucose tolerance 01/07/2011  . LOW BACK PAIN 01/01/2007  . Muscle weakness (generalized) 06/04/2009  . OSTEOARTHRITIS, HIP 07/01/2008  . OSTEOPOROSIS 01/01/2007  . Pneumonia 1998  . Rosacea 01/08/2011  . Shortness of breath   . SYNCOPE 01/01/2007  . TIA (transient ischemic attack)   . TRANSIENT ISCHEMIC ATTACK, HX OF 01/01/2007    Patient Active Problem List   Diagnosis Date Noted  . Secondary pulmonary arterial hypertension ([place]) 09/02/2016  . Dysphagia 10/11/2016  . Peripheral edema 06/15/2016  . Smoker 08/02/2014  . Peripheral vascular disease ([place]) 08/02/2014  . Abdominal pain, LLQ 11/22/2013  . Black stools 11/22/2013  . Heme  positive stool 11/22/2013  . Acute blood loss anemia 10/24/2013  . Displaced fracture of left femoral neck ([place]) 09/30/2013  . Fracture of femoral neck, left, closed ([place]) 09/30/2013  . Fall at home 09/30/2013  . Head contusion 09/30/2013  . Leg weakness, bilateral 07/21/2012  . 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 Unstable angina pectoris ([place]) 04/12/2012  . Chronic respiratory failure ([place]) 02/13/2012  . Localized swelling, mass and lump, neck 01/06/2012  . Dyspnea 11/22/2011  . Personal history of colonic polyps 09/06/2011  . Eczema 01/08/2011  . Rosacea 01/08/2011  . Dizziness - light-headed 01/08/2011  . Encounter for long-term (current) use of high-risk medication 01/08/2011  . Impaired glucose tolerance 01/07/2011  . Preventative health care 01/07/2011  . BURSITIS, RIGHT HIP 06/04/2009  . CAD, NATIVE VESSEL 01/15/2009  . Other symptoms involving cardiovascular system 01/15/2009  . CHEST PAIN-PRECORDIAL 01/15/2009  . OSTEOARTHRITIS, HIP 07/01/2008  . Hyperlipidemia 01/01/2007  . Anxiety state 01/01/2007  . Depression 01/01/2007  . Essential hypertension 01/01/2007  . COPD (chronic obstructive pulmonary disease) with emphysema ([place]) 01/01/2007  . LOW BACK PAIN 01/01/2007  . OSTEOPOROSIS 01/01/2007  . SYNCOPE 01/01/2007  . Headache(784.0) 01/01/2007  . TRANSIENT ISCHEMIC ATTACK, HX OF 01/01/2007    Past Surgical History:  Procedure Laterality Date  . ABDOMINAL HYSTERECTOMY    . APPENDECTOMY    . BACK SURGERY    . BREAST ENHANCEMENT  SURGERY    . COLONOSCOPY  01/25/2002   tubular adenoma,hemorrhoids, hyperplastic  colon polyps  . COLONOSCOPY  02/17/2005   hemorrhoids  . CORONARY ANGIOPLASTY    . CORONARY STENT PLACEMENT    . HIP ARTHROPLASTY Left 10/01/2013   Procedure: ARTHROPLASTY UNIPOLAR   HIP;  Surgeon: Marianna Payment, MD;  Location: [facility];  Service: Orthopedics;  Laterality: Left;  . HIP SURGERY Left    DR XU     PROXIMAL NECK   . IR GENERIC HISTORICAL  02/19/2016     IR VERTEBROPLASTY CERV/THOR BX INC UNI/BIL INC/INJECT/IMAGING 02/19/2016 Luanne Bras, MD [facility]  . IR GENERIC HISTORICAL  07/15/2016   IR KYPHO THORACIC WITH BONE BIOPSY 07/15/2016 Luanne Bras, MD [facility]  . LEFT HEART CATHETERIZATION WITH CORONARY ANGIOGRAM N/A 04/13/2012   Procedure: LEFT HEART CATHETERIZATION WITH CORONARY ANGIOGRAM;  Surgeon: Burnell Blanks, MD;  Location: [facility] CATH LAB;  Service: Cardiovascular;  Laterality: N/A;  . OOPHORECTOMY     one ovary  . s/p bilat cataract  2010  . sp lumbar disc surgury     [name]    OB History    No data available       Home Medications    Prior to Admission medications   Medication Sig Start Date End Date Taking? Authorizing Provider  ascorbic acid (VITAMIN C) 1000 MG tablet Take 1,000 mg by mouth daily.   Yes Historical Provider, MD  Cholecalciferol (VITAMIN D) 1000 UNITS capsule Take 1,000 Units by mouth every evening.    Yes Historical Provider, MD  clopidogrel (PLAVIX) 75 MG tablet TAKE 1 TABLET (75 MG TOTAL) BY MOUTH DAILY. 12/11/15  Yes Burnell Blanks, MD  docusate sodium (COLACE) 100 MG capsule Take 100 mg by mouth daily as needed. For constipation   Yes Historical Provider, MD  furosemide (LASIX) 20 MG tablet Take 1 tablet (20 mg total) by mouth daily. 06/15/16  Yes Biagio Borg, MD  isosorbide mononitrate (IMDUR) 30 MG 24 hr tablet TAKE 0.5 TABLETS (15 MG TOTAL) BY MOUTH DAILY. 12/11/15  Yes Burnell Blanks, MD  lovastatin (MEVACOR) 20 MG tablet TAKE 1 TABLET BY MOUTH AT BEDTIME 03/10/16  Yes Biagio Borg, MD  metoprolol tartrate (LOPRESSOR) 25 MG tablet TAKE 1/2 TABLET BY MOUTH 2 TIMES A DAY 02/10/16  Yes Historical Provider, MD  nitroGLYCERIN (NITROSTAT) 0.4 MG SL tablet Place 1 tablet (0.4 mg total) under the tongue every 5 (five) minutes as needed for chest pain (up to 3 doses). 04/13/12  Yes Dayna N Dunn, PA-C  nortriptyline (PAMELOR) 10 MG capsule Take 20 mg by mouth at  bedtime.  07/24/13  Yes Historical Provider, MD  ondansetron (ZOFRAN) 4 MG tablet Take 1 tablet (4 mg total) by mouth every 8 (eight) hours as needed for nausea or vomiting. 01/06/16  Yes Courteney Lyn Mackuen, MD  polyethylene glycol (MIRALAX / GLYCOLAX) packet Take 17 g by mouth daily as needed for mild constipation.   Yes Historical Provider, MD  polyvinyl alcohol (LIQUIFILM TEARS) 1.4 % ophthalmic solution Place 1 drop into both eyes daily as needed.    Yes Historical Provider, MD  predniSONE (DELTASONE) 10 MG tablet Take 1.5 tablets (15 mg total) by mouth daily with breakfast. 09/09/16  Yes Collene Gobble, MD  PROAIR HFA 108 [phone] Base) MCG/ACT inhaler INHALE 2 PUFFS INTO THE LUNGS EVERY 6 (SIX) HOURS AS NEEDED FOR WHEEZING OR SHORTNESS OF BREATH. 07/20/16  Yes Biagio Borg, MD  tiotropium (SPIRIVA HANDIHALER) 18 MCG inhalation capsule Place 1 capsule (18 mcg total) into inhaler and inhale daily at 6 (six) AM. 05/26/16  Yes Biagio Borg, MD  traMADol (ULTRAM) 50 MG tablet Take 50 mg by mouth every 6 (six) hours as needed for moderate pain.    Yes Historical Provider, MD  vitamin B-12 (CYANOCOBALAMIN) 1000 MCG tablet Take 1,000 mcg by mouth daily.   Yes Historical Provider, MD  lidocaine (LIDODERM) 5 % Place 1 patch onto the skin daily. Remove & Discard patch within 12 hours or as directed by MD Patient not taking: Reported on 09/09/2016 02/05/16   Margette Fast, MD  predniSONE (DELTASONE) 20 MG tablet TAKE 1 TABLET BY MOUTH DAILY WITH BREAKFAST Patient not taking: Reported on 09/09/2016 05/11/16   Collene Gobble, MD    Family History Family History  Problem Relation Age of Onset  . Osteoporosis Mother   . Heart disease Sister   . Emphysema Sister   . Seizures Sister     epilepsy  . Cardiomyopathy Sister   . Heart attack Sister     Social History Social History  Substance Use Topics  . Smoking status: Former Smoker    Packs/day: 1.50    Years: 51.00    Types: Cigarettes    Quit date:  07/24/2005  . Smokeless tobacco: Never Used  . Alcohol use No     Allergies   Aspirin; Other; and Codeine   Review of Systems Review of Systems  All other systems reviewed and are negative.    Physical Exam Updated Vital Signs BP 135/78 (BP Location: Right Arm)   Pulse (!) 109   Temp 97.4 F (36.3 C) (Oral)   Resp 15   SpO2 94%   Physical Exam  Constitutional: She is oriented to person, place, and time. She appears well-developed.  Elderly, frail  HENT:  Head: Normocephalic and atraumatic.  Eyes: Conjunctivae and EOM are normal. Pupils are equal, round, and reactive to light.  Neck: Normal range of motion and phonation normal. Neck supple.  Cardiovascular: Normal rate and regular rhythm.   Pulmonary/Chest: Effort normal and breath sounds normal. She exhibits no tenderness.  Abdominal: There is no tenderness.  Neurological: She is alert and oriented to person, place, and time. She exhibits normal muscle tone.  Skin: Skin is warm and dry.  Psychiatric: She has a normal mood and affect. Her behavior is normal. Judgment and thought content normal.  Nursing note and vitals reviewed.    ED Treatments / Results  Labs (all labs ordered are listed, but only abnormal results are displayed) Labs Reviewed  CBC WITH DIFFERENTIAL/PLATELET - Abnormal; Notable for the following:       Result Value   WBC 19.1 (*)    Neutro Abs 15.9 (*)    All other components within normal limits  BASIC METABOLIC PANEL - Abnormal; Notable for the following:    Potassium 3.2 (*)    Chloride 99 (*)    CO2 33 (*)    Glucose, Bld 130 (*)    Creatinine, Ser 1.10 (*)    GFR calc non Af Amer 48 (*)    GFR calc Af Amer 55 (*)    All other components within normal limits    EKG  EKG Interpretation None       Radiology No results found.  Procedures Procedures (including critical care time)  Medications Ordered in ED Medications  fentaNYL (SUBLIMAZE) injection 50 mcg (50 mcg Intravenous  Given  09/09/16 2112)  LORazepam (ATIVAN) injection 1 mg (1 mg Intravenous Given 09/09/16 2240)  oxyCODONE-acetaminophen (PERCOCET/ROXICET) 5-325 MG per tablet 1 tablet (1 tablet Oral Given 09/09/16 2240)  fentaNYL (SUBLIMAZE) injection 50 mcg (50 mcg Intravenous Given 09/09/16 2240)  ondansetron (ZOFRAN) injection 4 mg (4 mg Intravenous Given 09/09/16 2244)  HYDROmorphone (DILAUDID) injection 1 mg (1 mg Intravenous Given 09/09/16 2322)     Initial Impression / Assessment and Plan / ED Course  I have reviewed the triage vital signs and the nursing notes.  Pertinent labs & imaging results that were available during my care of the patient were reviewed by me and considered in my medical decision making (see chart for details).     Medications  fentaNYL (SUBLIMAZE) injection 50 mcg (50 mcg Intravenous Given 09/09/16 2112)  LORazepam (ATIVAN) injection 1 mg (1 mg Intravenous Given 09/09/16 2240)  oxyCODONE-acetaminophen (PERCOCET/ROXICET) 5-325 MG per tablet 1 tablet (1 tablet Oral Given 09/09/16 2240)  fentaNYL (SUBLIMAZE) injection 50 mcg (50 mcg Intravenous Given 09/09/16 2240)  ondansetron (ZOFRAN) injection 4 mg (4 mg Intravenous Given 09/09/16 2244)  HYDROmorphone (DILAUDID) injection 1 mg (1 mg Intravenous Given 09/09/16 2322)    Patient Vitals for the past 24 hrs:  BP Temp Temp src Pulse Resp SpO2  09/09/16 2322 135/78 - - (!) 109 15 94 %  09/09/16 2017 (!) 147/111 97.4 F (36.3 C) Oral (!) 108 18 91 %    12:26 AM Reevaluation with update and discussion. After initial assessment and treatment, an updated evaluation reveals patient is sleeping soundly, cannot be aroused.  No respiratory distress.  Findings discussed with husband and all questions answered. Kashayla Ungerer L    Final Clinical Impressions(s) / ED Diagnoses   Final diagnoses:  Closed wedge compression fracture of eleventh thoracic vertebra, initial encounter ([place])    Back pain secondary to thoracic compression fracture.   Patient very uncomfortable required multiple doses, medication.  Nonspecific elevation of white blood cell count.  Potassium somewhat low at 3.2.   .Nursing Notes Reviewed/ Care Coordinated Applicable Imaging Reviewed Interpretation of Laboratory Data incorporated into ED treatment  Plan- As per Dr. Randal Buba after patient awakens. Home if better, admit if pain is intractable.   New Prescriptions New Prescriptions   No medications on file     Daleen Bo, MD 09/10/16 1623

## 2016-09-09 NOTE — ED Notes (Signed)
Bed: JS41 Expected date:  Expected time:  Means of arrival:  Comments: EMS  Elderly  Multiple complaints

## 2016-09-10 ENCOUNTER — Encounter (HOSPITAL_COMMUNITY): Payer: Self-pay

## 2016-09-10 ENCOUNTER — Encounter (HOSPITAL_COMMUNITY): Payer: Self-pay | Admitting: Emergency Medicine

## 2016-09-10 ENCOUNTER — Emergency Department (HOSPITAL_COMMUNITY): Payer: Medicare Other

## 2016-09-10 ENCOUNTER — Inpatient Hospital Stay (HOSPITAL_COMMUNITY)
Admission: EM | Admit: 2016-09-10 | Discharge: 2016-09-15 | DRG: 516 | Disposition: A | Discharge status: Home Health | Payer: Medicare Other | Attending: Nephrology | Admitting: Nephrology

## 2016-09-10 DIAGNOSIS — Z66 Do not resuscitate: Secondary | ICD-10-CM | POA: Diagnosis present

## 2016-09-10 DIAGNOSIS — Z7952 Long term (current) use of systemic steroids: Secondary | ICD-10-CM

## 2016-09-10 DIAGNOSIS — F329 Major depressive disorder, single episode, unspecified: Secondary | ICD-10-CM | POA: Diagnosis present

## 2016-09-10 DIAGNOSIS — R079 Chest pain, unspecified: Secondary | ICD-10-CM | POA: Diagnosis not present

## 2016-09-10 DIAGNOSIS — E876 Hypokalemia: Secondary | ICD-10-CM | POA: Diagnosis present

## 2016-09-10 DIAGNOSIS — D72829 Elevated white blood cell count, unspecified: Secondary | ICD-10-CM | POA: Diagnosis present

## 2016-09-10 DIAGNOSIS — Z9981 Dependence on supplemental oxygen: Secondary | ICD-10-CM

## 2016-09-10 DIAGNOSIS — Z87891 Personal history of nicotine dependence: Secondary | ICD-10-CM

## 2016-09-10 DIAGNOSIS — R072 Precordial pain: Secondary | ICD-10-CM | POA: Diagnosis not present

## 2016-09-10 DIAGNOSIS — Z79899 Other long term (current) drug therapy: Secondary | ICD-10-CM | POA: Diagnosis not present

## 2016-09-10 DIAGNOSIS — M4854XA Collapsed vertebra, not elsewhere classified, thoracic region, initial encounter for fracture: Secondary | ICD-10-CM | POA: Diagnosis not present

## 2016-09-10 DIAGNOSIS — G8929 Other chronic pain: Secondary | ICD-10-CM | POA: Diagnosis present

## 2016-09-10 DIAGNOSIS — Z955 Presence of coronary angioplasty implant and graft: Secondary | ICD-10-CM | POA: Diagnosis not present

## 2016-09-10 DIAGNOSIS — M810 Age-related osteoporosis without current pathological fracture: Secondary | ICD-10-CM | POA: Diagnosis present

## 2016-09-10 DIAGNOSIS — Z7902 Long term (current) use of antithrombotics/antiplatelets: Secondary | ICD-10-CM

## 2016-09-10 DIAGNOSIS — E785 Hyperlipidemia, unspecified: Secondary | ICD-10-CM | POA: Diagnosis not present

## 2016-09-10 DIAGNOSIS — J9611 Chronic respiratory failure with hypoxia: Secondary | ICD-10-CM | POA: Diagnosis not present

## 2016-09-10 DIAGNOSIS — I2510 Atherosclerotic heart disease of native coronary artery without angina pectoris: Secondary | ICD-10-CM | POA: Diagnosis present

## 2016-09-10 DIAGNOSIS — M549 Dorsalgia, unspecified: Secondary | ICD-10-CM | POA: Diagnosis not present

## 2016-09-10 DIAGNOSIS — M545 Low back pain: Secondary | ICD-10-CM | POA: Diagnosis present

## 2016-09-10 DIAGNOSIS — M5489 Other dorsalgia: Secondary | ICD-10-CM | POA: Diagnosis not present

## 2016-09-10 DIAGNOSIS — I252 Old myocardial infarction: Secondary | ICD-10-CM | POA: Diagnosis not present

## 2016-09-10 DIAGNOSIS — S22000A Wedge compression fracture of unspecified thoracic vertebra, initial encounter for closed fracture: Secondary | ICD-10-CM | POA: Diagnosis not present

## 2016-09-10 DIAGNOSIS — J449 Chronic obstructive pulmonary disease, unspecified: Secondary | ICD-10-CM | POA: Diagnosis not present

## 2016-09-10 DIAGNOSIS — Z8673 Personal history of transient ischemic attack (TIA), and cerebral infarction without residual deficits: Secondary | ICD-10-CM

## 2016-09-10 DIAGNOSIS — J439 Emphysema, unspecified: Secondary | ICD-10-CM | POA: Diagnosis not present

## 2016-09-10 DIAGNOSIS — F419 Anxiety disorder, unspecified: Secondary | ICD-10-CM | POA: Diagnosis present

## 2016-09-10 DIAGNOSIS — S22080A Wedge compression fracture of T11-T12 vertebra, initial encounter for closed fracture: Secondary | ICD-10-CM | POA: Diagnosis not present

## 2016-09-10 DIAGNOSIS — I10 Essential (primary) hypertension: Secondary | ICD-10-CM | POA: Diagnosis not present

## 2016-09-10 DIAGNOSIS — J9610 Chronic respiratory failure, unspecified whether with hypoxia or hypercapnia: Secondary | ICD-10-CM | POA: Diagnosis not present

## 2016-09-10 DIAGNOSIS — M546 Pain in thoracic spine: Secondary | ICD-10-CM | POA: Diagnosis not present

## 2016-09-10 HISTORY — DX: Dependence on supplemental oxygen: Z99.81

## 2016-09-10 LAB — CREATININE, SERUM
Creatinine, Ser: 1.01 mg/dL — ABNORMAL HIGH (ref 0.44–1.00)
GFR calc Af Amer: >60 mL/min (ref >60)
GFR calc non Af Amer: 53 mL/min — ABNORMAL LOW (ref >60)

## 2016-09-10 LAB — TROPONIN I
TROPONIN I: 0.04 ng/mL — AB (ref <0.03)
Troponin I: 0.04 ng/mL (ref <0.03)

## 2016-09-10 MED ORDER — IOPAMIDOL (ISOVUE-370) INJECTION 76%
INTRAVENOUS | Status: AC
  Administered 2016-09-10: 100 mL via INTRAVENOUS
  Filled 2016-09-10: qty 100

## 2016-09-10 MED ORDER — ALBUTEROL SULFATE (2.5 MG/3ML) 0.083% IN NEBU
2.5000 mg | INHALATION_SOLUTION | Freq: Four times a day (QID) | RESPIRATORY_TRACT | Status: DC | PRN
  Administered 2016-09-10 – 2016-09-13 (×4): 2.5 mg via RESPIRATORY_TRACT
  Filled 2016-09-10 (×4): qty 3

## 2016-09-10 MED ORDER — OXYCODONE-ACETAMINOPHEN 5-325 MG PO TABS
2.0000 | ORAL_TABLET | ORAL | Status: DC | PRN
  Administered 2016-09-14 – 2016-09-15 (×3): 2 via ORAL
  Filled 2016-09-10 (×4): qty 2

## 2016-09-10 MED ORDER — OXYCODONE-ACETAMINOPHEN 5-325 MG PO TABS: 1.0000 | ORAL_TABLET | Freq: Four times a day (QID) | ORAL | 0 refills | Status: DC | PRN

## 2016-09-10 MED ORDER — NITROGLYCERIN 0.4 MG SL SUBL: 0.4000 mg | SUBLINGUAL_TABLET | SUBLINGUAL | Status: DC | PRN

## 2016-09-10 MED ORDER — POLYETHYLENE GLYCOL 3350 17 G PO PACK
17.0000 g | PACK | Freq: Every day | ORAL | Status: DC
  Administered 2016-09-12 – 2016-09-15 (×3): 17 g via ORAL
  Filled 2016-09-10 (×5): qty 1

## 2016-09-10 MED ORDER — ONDANSETRON HCL 4 MG/2ML IJ SOLN
4.0000 mg | Freq: Once | INTRAMUSCULAR | Status: AC
  Administered 2016-09-10: 4 mg via INTRAVENOUS
  Filled 2016-09-10: qty 2

## 2016-09-10 MED ORDER — ISOSORBIDE MONONITRATE ER 30 MG PO TB24
30.0000 mg | ORAL_TABLET | Freq: Every day | ORAL | Status: DC
  Administered 2016-09-11 – 2016-09-15 (×5): 30 mg via ORAL
  Filled 2016-09-10 (×5): qty 1

## 2016-09-10 MED ORDER — METOPROLOL TARTRATE 25 MG PO TABS
25.0000 mg | ORAL_TABLET | Freq: Two times a day (BID) | ORAL | Status: DC
  Administered 2016-09-10 – 2016-09-15 (×11): 25 mg via ORAL
  Filled 2016-09-10 (×8): qty 1
  Filled 2016-09-10: qty 2
  Filled 2016-09-10 (×2): qty 1

## 2016-09-10 MED ORDER — SODIUM CHLORIDE 0.9% FLUSH
3.0000 mL | Freq: Two times a day (BID) | INTRAVENOUS | Status: DC
  Administered 2016-09-10 – 2016-09-14 (×9): 3 mL via INTRAVENOUS

## 2016-09-10 MED ORDER — FENTANYL CITRATE (PF) 100 MCG/2ML IJ SOLN: 25.0000 ug | INTRAMUSCULAR | Status: DC | PRN

## 2016-09-10 MED ORDER — HEPARIN SODIUM (PORCINE) 5000 UNIT/ML IJ SOLN
5000.0000 [IU] | Freq: Three times a day (TID) | INTRAMUSCULAR | Status: DC
  Administered 2016-09-10 – 2016-09-11 (×2): 5000 [IU] via SUBCUTANEOUS
  Filled 2016-09-10 (×2): qty 1

## 2016-09-10 MED ORDER — NORTRIPTYLINE HCL 10 MG PO CAPS
20.0000 mg | ORAL_CAPSULE | Freq: Every day | ORAL | Status: DC
  Administered 2016-09-10 – 2016-09-13 (×4): 20 mg via ORAL
  Filled 2016-09-10 (×5): qty 2

## 2016-09-10 MED ORDER — PREDNISONE 20 MG PO TABS
20.0000 mg | ORAL_TABLET | Freq: Every day | ORAL | Status: DC
  Administered 2016-09-11: 20 mg via ORAL
  Filled 2016-09-10 (×2): qty 1

## 2016-09-10 MED ORDER — TIOTROPIUM BROMIDE MONOHYDRATE 18 MCG IN CAPS
18.0000 ug | ORAL_CAPSULE | Freq: Every day | RESPIRATORY_TRACT | Status: DC
Start: 2016-09-11 — End: 2016-09-10
  Filled 2016-09-10: qty 5

## 2016-09-10 MED ORDER — HYDROMORPHONE HCL 1 MG/ML IJ SOLN
0.5000 mg | INTRAMUSCULAR | Status: DC | PRN
  Administered 2016-09-10 – 2016-09-11 (×5): 0.5 mg via INTRAVENOUS
  Filled 2016-09-10 (×5): qty 1

## 2016-09-10 MED ORDER — PRAVASTATIN SODIUM 20 MG PO TABS
20.0000 mg | ORAL_TABLET | Freq: Every day | ORAL | Status: DC
  Administered 2016-09-10 – 2016-09-14 (×4): 20 mg via ORAL
  Filled 2016-09-10 (×5): qty 1

## 2016-09-10 MED ORDER — VITAMIN D 1000 UNITS PO TABS
1000.0000 [IU] | ORAL_TABLET | Freq: Every evening | ORAL | Status: DC
  Administered 2016-09-10 – 2016-09-14 (×5): 1000 [IU] via ORAL
  Filled 2016-09-10 (×10): qty 1

## 2016-09-10 MED ORDER — POTASSIUM CHLORIDE CRYS ER 20 MEQ PO TBCR
30.0000 meq | EXTENDED_RELEASE_TABLET | Freq: Two times a day (BID) | ORAL | Status: AC
  Administered 2016-09-10 – 2016-09-11 (×2): 30 meq via ORAL
  Filled 2016-09-10 (×2): qty 1

## 2016-09-10 MED ORDER — DICLOFENAC SODIUM 1 % TD GEL: 4.0000 g | Freq: Four times a day (QID) | TRANSDERMAL | 0 refills | Status: DC

## 2016-09-10 MED ORDER — TIOTROPIUM BROMIDE MONOHYDRATE 18 MCG IN CAPS
18.0000 ug | ORAL_CAPSULE | Freq: Every day | RESPIRATORY_TRACT | Status: DC
  Administered 2016-09-11 – 2016-09-15 (×4): 18 ug via RESPIRATORY_TRACT
  Filled 2016-09-10 (×2): qty 5

## 2016-09-10 MED ORDER — MORPHINE SULFATE (PF) 4 MG/ML IV SOLN
4.0000 mg | Freq: Once | INTRAVENOUS | Status: AC
  Administered 2016-09-10: 4 mg via INTRAVENOUS
  Filled 2016-09-10: qty 1

## 2016-09-10 MED ORDER — MORPHINE SULFATE (PF) 4 MG/ML IV SOLN
4.0000 mg | Freq: Once | INTRAVENOUS | Status: AC
Start: 2016-09-10 — End: 2016-09-10
  Administered 2016-09-10: 4 mg via INTRAVENOUS
  Filled 2016-09-10: qty 1

## 2016-09-10 MED ORDER — ONDANSETRON HCL 4 MG/2ML IJ SOLN
4.0000 mg | Freq: Once | INTRAMUSCULAR | Status: AC
  Administered 2016-09-11: 4 mg via INTRAVENOUS
  Filled 2016-09-10: qty 2

## 2016-09-10 MED ORDER — TRAZODONE HCL 50 MG PO TABS
25.0000 mg | ORAL_TABLET | Freq: Every evening | ORAL | Status: DC | PRN
  Administered 2016-09-14: 25 mg via ORAL
  Filled 2016-09-10: qty 1

## 2016-09-10 MED ORDER — ONDANSETRON HCL 4 MG/2ML IJ SOLN
4.0000 mg | Freq: Four times a day (QID) | INTRAMUSCULAR | Status: DC | PRN
  Administered 2016-09-10: 4 mg via INTRAVENOUS
  Filled 2016-09-10: qty 2

## 2016-09-10 MED ORDER — DOCUSATE SODIUM 100 MG PO CAPS
100.0000 mg | ORAL_CAPSULE | Freq: Every day | ORAL | Status: DC
  Administered 2016-09-11 – 2016-09-15 (×4): 100 mg via ORAL
  Filled 2016-09-10 (×5): qty 1

## 2016-09-10 MED ORDER — PROMETHAZINE HCL 25 MG PO TABS
12.5000 mg | ORAL_TABLET | Freq: Four times a day (QID) | ORAL | Status: DC | PRN
  Administered 2016-09-11: 12.5 mg via ORAL
  Filled 2016-09-10: qty 1

## 2016-09-10 MED ORDER — POLYVINYL ALCOHOL 1.4 % OP SOLN
1.0000 [drp] | Freq: Three times a day (TID) | OPHTHALMIC | Status: DC | PRN
  Filled 2016-09-10: qty 15

## 2016-09-10 MED ORDER — IOPAMIDOL (ISOVUE-370) INJECTION 76%
100.0000 mL | Freq: Once | INTRAVENOUS | Status: AC | PRN
Start: 2016-09-10 — End: 2016-09-10
  Administered 2016-09-10: 100 mL via INTRAVENOUS

## 2016-09-10 MED ORDER — TRAMADOL HCL 50 MG PO TABS
50.0000 mg | ORAL_TABLET | Freq: Four times a day (QID) | ORAL | Status: DC | PRN
  Administered 2016-09-11 (×2): 50 mg via ORAL
  Filled 2016-09-10 (×2): qty 1

## 2016-09-10 MED ORDER — KETOROLAC TROMETHAMINE 30 MG/ML IJ SOLN
30.0000 mg | Freq: Once | INTRAMUSCULAR | Status: AC
  Administered 2016-09-10: 30 mg via INTRAVENOUS
  Filled 2016-09-10: qty 1

## 2016-09-10 MED ORDER — HYDROMORPHONE HCL 1 MG/ML IJ SOLN
1.0000 mg | INTRAMUSCULAR | Status: DC | PRN
  Administered 2016-09-10: 1 mg via INTRAVENOUS
  Filled 2016-09-10: qty 1

## 2016-09-10 MED ORDER — FUROSEMIDE 20 MG PO TABS
20.0000 mg | ORAL_TABLET | Freq: Every day | ORAL | Status: DC
  Administered 2016-09-11 – 2016-09-15 (×5): 20 mg via ORAL
  Filled 2016-09-10 (×5): qty 1

## 2016-09-10 NOTE — ED Notes (Signed)
PT given water to drink.

## 2016-09-10 NOTE — ED Notes (Signed)
Attempted report 

## 2016-09-10 NOTE — ED Provider Notes (Signed)
[facility] DEPT Provider Note   CSN: [id number] Arrival date & time: 09/10/16  1239     History   Chief Complaint Chief Complaint  Patient presents with  . Back Pain    HPI Alexis Higgins is a 76 y.o. female.  Patient is a 76 year old elderly female with multiple medical conditions including coronary artery disease status post stent, COPD on chronic oxygen therapy, hypertension, chronic prednisone therapy with multiple compression fractures returning today for worsening pain. Patient started having severe back pain 4 days ago and had an MRI done 2 days ago that showed a new thoracic compression fracture most likely related to chronic steroid use. Patient had a planned kyphoplasty on Tuesday but went the emergency room last night due to worsening pain. In the emergency room patient was given oral and IV medication with improvement of pain however upon being discharged home she feels no better. She states the pain in her back has moved around to the lower part of her sternum making it hard to breathe. She tried diclofenac gel which was prescribed to her without improvement of her symptoms. She denies new cough and feels her breathing is about at baseline. I spoke with interventional radiology who is unable to do the procedure any sooner because she needs to be off Plavix a significant amount of time. They recommended coming to the emergency room due to her severe pain. Patient states at this point pain is 10 out of 10 sharp and located mostly in the sternal area. She denies any abdominal pain or vomiting but does complain of some mild nausea. She normally will take tramadol for her pain but has not had any tramadol in the last 12 hours.   The history is provided by the patient and the spouse.    Past Medical History:  Diagnosis Date  . Abnormal chest x-ray    03/2012: will need OP f/u.  Marland Kitchen Anginal pain ([place])   . ANXIETY 01/01/2007  . BURSITIS, RIGHT HIP 06/04/2009  . CAD (coronary  artery disease)    a. BMS to LAD 2010. b. NSTEMI with DES to LAD for ISR 2011. c. Patent stent 03/2012/Imdur added.  . Cataract   . CHEST PAIN-PRECORDIAL 01/15/2009  . Colon polyps    H/o tubular adenoma of colon  . COPD 01/01/2007   a. Chronic resp failure on home O2.  Marland Kitchen DEPRESSION 01/01/2007  . Eczema 01/08/2011  . GROIN PAIN 06/20/2008  . Headache(784.0) 01/01/2007  . Hemorrhoid   . HYPERTENSION 01/01/2007  . Impaired glucose tolerance 01/07/2011  . LOW BACK PAIN 01/01/2007  . Muscle weakness (generalized) 06/04/2009  . OSTEOARTHRITIS, HIP 07/01/2008  . OSTEOPOROSIS 01/01/2007  . Pneumonia 1998  . Rosacea 01/08/2011  . Shortness of breath   . SYNCOPE 01/01/2007  . TIA (transient ischemic attack)   . TRANSIENT ISCHEMIC ATTACK, HX OF 01/01/2007    Patient Active Problem List   Diagnosis Date Noted  . 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 Dysphagia 08/12/2016  . Peripheral edema 06/15/2016  . Smoker 08/02/2014  . Peripheral vascular disease ([place]) 08/02/2014  . Abdominal pain, LLQ 11/22/2013  . Black stools 11/22/2013  . Heme positive stool 11/22/2013  . Acute blood loss anemia 10/24/2013  . Displaced fracture of left femoral neck ([place]) 09/30/2013  . Fracture of femoral neck, left, closed ([place]) 09/30/2013  . Fall at home 09/30/2013  . Head contusion 09/30/2013  . Leg weakness, bilateral 07/21/2012  . 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  Unstable angina pectoris ([place]) 04/12/2012  . Chronic respiratory failure ([place]) 02/13/2012  . Localized swelling, mass and lump, neck 01/06/2012  . Dyspnea 11/22/2011  . Personal history of colonic polyps 09/06/2011  . Eczema 01/08/2011  . Rosacea 01/08/2011  . Dizziness - light-headed 01/08/2011  . Encounter for long-term (current) use of high-risk medication 01/08/2011  . Impaired glucose tolerance 01/07/2011  . Preventative health care 01/07/2011  . BURSITIS, RIGHT HIP 06/04/2009  . CAD, NATIVE VESSEL 01/15/2009  . Other  symptoms involving cardiovascular system 01/15/2009  . CHEST PAIN-PRECORDIAL 01/15/2009  . OSTEOARTHRITIS, HIP 07/01/2008  . Hyperlipidemia 01/01/2007  . Anxiety state 01/01/2007  . Depression 01/01/2007  . Essential hypertension 01/01/2007  . COPD (chronic obstructive pulmonary disease) with emphysema ([place]) 01/01/2007  . LOW BACK PAIN 01/01/2007  . OSTEOPOROSIS 01/01/2007  . SYNCOPE 01/01/2007  . Headache(784.0) 01/01/2007  . TRANSIENT ISCHEMIC ATTACK, HX OF 01/01/2007    Past Surgical History:  Procedure Laterality Date  . ABDOMINAL HYSTERECTOMY    . APPENDECTOMY    . BACK SURGERY    . BREAST ENHANCEMENT SURGERY    . COLONOSCOPY  01/25/2002   tubular adenoma,hemorrhoids, hyperplastic  colon polyps  . COLONOSCOPY  02/17/2005   hemorrhoids  . CORONARY ANGIOPLASTY    . CORONARY STENT PLACEMENT    . HIP ARTHROPLASTY Left 10/01/2013   Procedure: ARTHROPLASTY UNIPOLAR   HIP;  Surgeon: Marianna Payment, MD;  Location: [facility];  Service: Orthopedics;  Laterality: Left;  . HIP SURGERY Left    DR XU     PROXIMAL NECK   . IR GENERIC HISTORICAL  02/19/2016   IR VERTEBROPLASTY CERV/THOR BX INC UNI/BIL INC/INJECT/IMAGING 02/19/2016 Luanne Bras, MD [facility]INTER[facility]  . IR GENERIC HISTORICAL  07/15/2016   IR KYPHO THORACIC WITH BONE BIOPSY 07/15/2016 Luanne Bras, MD [facility]  . LEFT HEART CATHETERIZATION WITH CORONARY ANGIOGRAM N/A 04/13/2012   Procedure: LEFT HEART CATHETERIZATION WITH CORONARY ANGIOGRAM;  Surgeon: Burnell Blanks, MD;  Location[facility] CATH LAB;  Service: Cardiovascular;  Laterality: N/A;  . OOPHORECTOMY     one ovary  . s/p bilat cataract  2010  . sp lumbar disc surgury     [name]    OB History    No data available       Home Medications    Prior to Admission medications   Medication Sig Start Date End Date Taking? Authorizing Provider  ascorbic acid (VITAMIN C) 1000 MG tablet Take 1,000 mg by mouth daily.    Historical Provider, MD    Cholecalciferol (VITAMIN D) 1000 UNITS capsule Take 1,000 Units by mouth every evening.     Historical Provider, MD  clopidogrel (PLAVIX) 75 MG tablet TAKE 1 TABLET (75 MG TOTAL) BY MOUTH DAILY. 12/11/15   Burnell Blanks, MD  diclofenac sodium (VOLTAREN) 1 % GEL Apply 4 g topically 4 (four) times daily. 09/10/16   April Palumbo, MD  docusate sodium (COLACE) 100 MG capsule Take 100 mg by mouth daily as needed. For constipation    Historical Provider, MD  furosemide (LASIX) 20 MG tablet Take 1 tablet (20 mg total) by mouth daily. 06/15/16   Biagio Borg, MD  isosorbide mononitrate (IMDUR) 30 MG 24 hr tablet TAKE 0.5 TABLETS (15 MG TOTAL) BY MOUTH DAILY. 12/11/15   Burnell Blanks, MD  lidocaine (LIDODERM) 5 % Place 1 patch onto the skin daily. Remove & Discard patch within 12 hours or as directed by MD Patient not taking: Reported on  09/09/2016 02/05/16   Margette Fast, MD  lovastatin (MEVACOR) 20 MG tablet TAKE 1 TABLET BY MOUTH AT BEDTIME 03/10/16   Biagio Borg, MD  metoprolol tartrate (LOPRESSOR) 25 MG tablet TAKE 1/2 TABLET BY MOUTH 2 TIMES A DAY 02/10/16   Historical Provider, MD  nitroGLYCERIN (NITROSTAT) 0.4 MG SL tablet Place 1 tablet (0.4 mg total) under the tongue every 5 (five) minutes as needed for chest pain (up to 3 doses). 04/13/12   Dayna N Dunn, PA-C  nortriptyline (PAMELOR) 10 MG capsule Take 20 mg by mouth at bedtime.  07/24/13   Historical Provider, MD  ondansetron (ZOFRAN) 4 MG tablet Take 1 tablet (4 mg total) by mouth every 8 (eight) hours as needed for nausea or vomiting. 01/06/16   Courteney Lyn Mackuen, MD  polyethylene glycol (MIRALAX / GLYCOLAX) packet Take 17 g by mouth daily as needed for mild constipation.    Historical Provider, MD  polyvinyl alcohol (LIQUIFILM TEARS) 1.4 % ophthalmic solution Place 1 drop into both eyes daily as needed.     Historical Provider, MD  predniSONE (DELTASONE) 10 MG tablet Take 1.5 tablets (15 mg total) by mouth daily with breakfast.  09/09/16   Collene Gobble, MD  predniSONE (DELTASONE) 20 MG tablet TAKE 1 TABLET BY MOUTH DAILY WITH BREAKFAST Patient not taking: Reported on 09/09/2016 05/11/16   Collene Gobble, MD  PROAIR HFA 108 [phone] Base) MCG/ACT inhaler INHALE 2 PUFFS INTO THE LUNGS EVERY 6 (SIX) HOURS AS NEEDED FOR WHEEZING OR SHORTNESS OF BREATH. 07/20/16   Biagio Borg, MD  tiotropium (SPIRIVA HANDIHALER) 18 MCG inhalation capsule Place 1 capsule (18 mcg total) into inhaler and inhale daily at 6 (six) AM. 05/26/16   Biagio Borg, MD  traMADol (ULTRAM) 50 MG tablet Take 50 mg by mouth every 6 (six) hours as needed for moderate pain.     Historical Provider, MD  vitamin B-12 (CYANOCOBALAMIN) 1000 MCG tablet Take 1,000 mcg by mouth daily.    Historical Provider, MD    Family History Family History  Problem Relation Age of Onset  . Osteoporosis Mother   . Heart disease Sister   . Emphysema Sister   . Seizures Sister     epilepsy  . Cardiomyopathy Sister   . Heart attack Sister     Social History Social History  Substance Use Topics  . Smoking status: Former Smoker    Packs/day: 1.50    Years: 51.00    Types: Cigarettes    Quit date: 07/24/2005  . Smokeless tobacco: Never Used  . Alcohol use No     Allergies   Aspirin; Other; and Codeine   Review of Systems Review of Systems  All other systems reviewed and are negative.    Physical Exam Updated Vital Signs Ht 5\' 6"  (1.676 m)   Wt 150 lb (68 kg)   BMI 24.21 kg/m   Physical Exam  Constitutional: She is oriented to person, place, and time. She appears well-developed and well-nourished. She appears distressed.  Appears uncomfortable  HENT:  Head: Normocephalic and atraumatic.  Mouth/Throat: Oropharynx is clear and moist.  Eyes: Conjunctivae and EOM are normal. Pupils are equal, round, and reactive to light.  Neck: Normal range of motion. Neck supple.  Cardiovascular: Normal rate, regular rhythm and intact distal pulses.   No murmur  heard. Pulmonary/Chest: Effort normal. No respiratory distress. She has decreased breath sounds. She has no wheezes. She has no rales. She exhibits tenderness.  Tenderness with  palpation of the lower sternum.  No pain with lateral compression of the chest  Abdominal: Soft. She exhibits no distension. There is no tenderness. There is no rebound and no guarding.  Musculoskeletal: Normal range of motion. She exhibits no edema or tenderness.  Multiple ecchymotic areas over the forearms in various stages of healing.  Neurological: She is alert and oriented to person, place, and time.  Skin: Skin is warm and dry. No rash noted. No erythema.  Psychiatric: She has a normal mood and affect. Her behavior is normal.  Nursing note and vitals reviewed.    ED Treatments / Results  Labs (all labs ordered are listed, but only abnormal results are displayed) Labs Reviewed  CBC WITH DIFFERENTIAL/PLATELET  I-STAT CHEM 8, ED  I-STAT TROPOININ, ED    EKG  EKG Interpretation None       Radiology Ct Angio Chest Pe W And/or Wo Contrast  Result Date: 09/10/2016 CLINICAL DATA:  Sternal chest pain, back pain, known compression fractures. EXAM: CT ANGIOGRAPHY CHEST WITH CONTRAST TECHNIQUE: Multidetector CT imaging of the chest was performed using the standard protocol during bolus administration of intravenous contrast. Multiplanar CT image reconstructions and MIPs were obtained to evaluate the vascular anatomy. CONTRAST:  100 cc Isovue 370 IV COMPARISON:  Thoracic spine MRI 09/07/2016, chest radiograph 06/30/2016. Chest CT 01/06/2016 FINDINGS: Cardiovascular: No aortic dissection or aneurysm, mild aortic atherosclerosis. The heart is normal in size. There are no filling defects within the pulmonary arteries to suggest pulmonary embolus. Coronary artery calcifications versus stents. Mediastinum/Nodes: No mediastinal or hilar adenopathy. Calcified right thyroid nodule is again seen. The esophagus is decompressed.  Lungs/Pleura: Advanced emphysema. Mild central bronchial thickening. Previous 3 mm pleural-based nodule is grossly stable, image 48 series 6, less well-defined currently. No new pulmonary nodule. No consolidation. No pulmonary edema. No pneumothorax or pleural fluid. Upper Abdomen: Ingested material within the stomach. No acute abnormality. Musculoskeletal: T10 and T5 vertebral compression fractures with augmentation. T11 compression fracture involving superior endplate, characterized on recent MRI. Sternum is intact. No acute osseous abnormality. Review of the MIP images confirms the above findings. IMPRESSION: 1. No pulmonary embolus. 2. Advanced emphysema.  Central bronchial thickening is chronic. 3. Mild aortic atherosclerosis.  Coronary artery calcifications. 4. Thoracic spine compression fractures, T10 and T5 treated with vertebral augmentation. T11 compression fracture, as characterized on recent MRI. Electronically Signed   By: Jeb Levering M.D.   On: 09/10/2016 03:52    Procedures Procedures (including critical care time)  Medications Ordered in ED Medications  morphine 4 MG/ML injection 4 mg (not administered)  ondansetron (ZOFRAN) injection 4 mg (not administered)     Initial Impression / Assessment and Plan / ED Course  I have reviewed the triage vital signs and the nursing notes.  Pertinent labs & imaging results that were available during my care of the patient were reviewed by me and considered in my medical decision making (see chart for details).    Patient presenting due to worsening pain not controlled at home. Patient with recent new thoracic compression fracture causing worsening pain. She was seen in the ED last night and discharged around 5 AM this morning. At that time she had a CTA of her chest which was negative for acute polyp pathology other than the new T11 compression fracture. Patient did have a leukocytosis of 19,000 unknown significance but has prior history of  leukocytosis. Here patient looks extremely uncomfortable. She is tachypnea holding her chest and rolling around in the bed.  She states the pain radiates from her back all the way to the front of her sternum. She denies any infectious symptoms. Low suspicion that this is cardiac in nature. Patient's labs from yesterday are significant only for the leukocytosis otherwise they're within normal limits. Patient cannot have her kyphoplasty until Tuesday. Interventional radiology recommended coming here and requesting admission for pain control pill patient can receive her kyphoplasty on Tuesday.  She given IV pain medication.  Pt requiring multiple doses of meds with some control. Given perocet prior to discharge without significant med problems.  Discussed with hospitalized for admission  Final Clinical Impressions(s) / ED Diagnoses   Final diagnoses:  Closed compression fracture of thoracic vertebra, initial encounter [facility])    New Prescriptions New Prescriptions   No medications on file     Blanchie Dessert, MD 09/10/16 [phone]

## 2016-09-10 NOTE — ED Notes (Signed)
Patient and spouse refusing bloodwork and EKG.  They say it is not necessary. They truly believe it is not her heart, that is pain from the compression fracture at T11.  They recently left [facility] where blood work was done.

## 2016-09-10 NOTE — H&P (Signed)
History and Physical    Alexis Higgins JME:[id number] DOB: 10-Dec-1940 DOA: 09/10/2016  PCP: Cathlean Cower, MD Patient coming from: home Chief Complaint: severe back pain and mid chest pain  HPI: Alexis Higgins is a 76 y.o. 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 Patient was scheduled for a planned kyphoplasty by interventional radiology-Dr. Earleen Newport on 24, but was seen yesterday in the  [facility] ED with complaints of intractable back pain which was getting worse with every breath and radiating from the back towards the sternum Last night she was given Percocet which did not help her pain and received the dose of IV opioids that controlled pain Patient was discharged home on tramadol and Voltaren gel around 5 AM today, but they had no effect on the level of hip pain.  So she returned to the ED with severe thoracic pain. IR was contacted but refuses to expedite the procedure due to patient's history of taking Plavix and the need to have the full week of not taking Plavix prior to the procedure. Her last dose of Plavix was on Monday - 09/06/2016  Patient reported that her pain that was initially located in the back and the ID to the center of the anterior chest right now is localized in the morning chest and feels like severe pressure  ED Course: On arrival to the ED her vital signs were stable except tachycardia of 109 and suboptimally controlled blood pressure 147/111 mmHg The family refused to have blood work done due to patient is being discharged from [facility] ED earlier this morning and had blood work done there yesterday Blood work from 09/09/2016 showed hypokalemia with potassium 3.2,  creatinine 1.10, leukocytosis of 19,100 Chest CT showed advanced emphysema, no pulmonary embolus, thoracic spine compression fractures T 5-T 10 treated with augmentation,  acute T11 compression fracture  Review of Systems: As per HPI otherwise 10 point review of systems negative.   Ambulatory Status: Independent  Past Medical History:  Diagnosis Date  . Abnormal chest x-ray    03/2012: will need OP f/u.  Marland Kitchen Anginal pain ([place])   . ANXIETY 01/01/2007  . BURSITIS, RIGHT HIP 06/04/2009  . CAD (coronary artery disease)    a. BMS to LAD 2010. b. NSTEMI with DES to LAD for ISR 2011. c. Patent stent 03/2012/Imdur added.  . Cataract   . CHEST PAIN-PRECORDIAL 01/15/2009  . Colon polyps    H/o tubular adenoma of colon  . COPD 01/01/2007   a. Chronic resp failure on home O2.  Marland Kitchen DEPRESSION 01/01/2007  . Eczema 01/08/2011  . GROIN PAIN 06/20/2008  . Headache(784.0) 01/01/2007  . Hemorrhoid   . HYPERTENSION 01/01/2007  . Impaired glucose tolerance 01/07/2011  . LOW BACK PAIN 01/01/2007  . Muscle weakness (generalized) 06/04/2009  . OSTEOARTHRITIS, HIP 07/01/2008  . OSTEOPOROSIS 01/01/2007  . Pneumonia 1998  . Rosacea 01/08/2011  . Shortness of breath   . SYNCOPE 01/01/2007  . TIA (transient ischemic attack)   . TRANSIENT ISCHEMIC ATTACK, HX OF 01/01/2007    Past Surgical History:  Procedure Laterality Date  . ABDOMINAL HYSTERECTOMY    . APPENDECTOMY    . BACK SURGERY    . BREAST ENHANCEMENT SURGERY    . COLONOSCOPY  01/25/2002  tubular adenoma,hemorrhoids, hyperplastic  colon polyps  . COLONOSCOPY  02/17/2005   hemorrhoids  . CORONARY ANGIOPLASTY    . CORONARY STENT PLACEMENT    . HIP ARTHROPLASTY Left 10/01/2013   Procedure: ARTHROPLASTY UNIPOLAR   HIP;  Surgeon: Marianna Payment, MD;  Location: [facility];  Service: Orthopedics;  Laterality: Left;  . HIP SURGERY Left    DR XU     PROXIMAL NECK   . IR GENERIC HISTORICAL  02/19/2016   IR VERTEBROPLASTY CERV/THOR BX INC UNI/BIL INC/INJECT/IMAGING  02/19/2016 Luanne Bras, MD [facility]INTER[facility]  . IR GENERIC HISTORICAL  07/15/2016   IR KYPHO THORACIC WITH BONE BIOPSY 07/15/2016 Luanne Bras, MD [facility]  . LEFT HEART CATHETERIZATION WITH CORONARY ANGIOGRAM N/A 04/13/2012   Procedure: LEFT HEART CATHETERIZATION WITH CORONARY ANGIOGRAM;  Surgeon: Burnell Blanks, MD;  Location: [facility] CATH LAB;  Service: Cardiovascular;  Laterality: N/A;  . OOPHORECTOMY     one ovary  . s/p bilat cataract  2010  . sp lumbar disc surgury     [name]    Social History   Social History  . Marital status: Married    Spouse name: N/A  . Number of children: 4  . Years of education: N/A   Occupational History  . disabled back since 2003    Social History Main Topics  . Smoking status: Former Smoker    Packs/day: 1.50    Years: 51.00    Types: Cigarettes    Quit date: 07/24/2005  . Smokeless tobacco: Never Used  . Alcohol use No  . Drug use: No  . Sexual activity: Not on file   Other Topics Concern  . Not on file   Social History Narrative  . No narrative on file    Allergies  Allergen Reactions  . Aspirin Other (See Comments)     cns bleed risk  . Other Other (See Comments)    Stiolto - severe reaction - caused inability to breath  . Codeine Rash    Family History  Problem Relation Age of Onset  . Osteoporosis Mother   . Heart disease Sister   . Emphysema Sister   . Seizures Sister     epilepsy  . Cardiomyopathy Sister   . Heart attack Sister     Prior to Admission medications   Medication Sig Start Date End Date Taking? Authorizing Provider  ascorbic acid (VITAMIN C) 1000 MG tablet Take 1,000 mg by mouth daily.    Historical Provider, MD  Cholecalciferol (VITAMIN D) 1000 UNITS capsule Take 1,000 Units by mouth every evening.     Historical Provider, MD  clopidogrel (PLAVIX) 75 MG tablet TAKE 1 TABLET (75 MG TOTAL) BY MOUTH DAILY. 12/11/15   Burnell Blanks, MD  diclofenac sodium (VOLTAREN) 1 % GEL  Apply 4 g topically 4 (four) times daily. 09/10/16   April Palumbo, MD  docusate sodium (COLACE) 100 MG capsule Take 100 mg by mouth daily as needed. For constipation    Historical Provider, MD  furosemide (LASIX) 20 MG tablet Take 1 tablet (20 mg total) by mouth daily. 06/15/16   Biagio Borg, MD  isosorbide mononitrate (IMDUR) 30 MG 24 hr tablet TAKE 0.5 TABLETS (15 MG TOTAL) BY MOUTH DAILY. 12/11/15   Burnell Blanks, MD  lidocaine (LIDODERM) 5 % Place 1 patch onto the skin daily. Remove & Discard patch within 12 hours or as directed by MD Patient not taking: Reported on 09/09/2016 02/05/16   Margette Fast,  MD  lovastatin (MEVACOR) 20 MG tablet TAKE 1 TABLET BY MOUTH AT BEDTIME 03/10/16   Biagio Borg, MD  metoprolol tartrate (LOPRESSOR) 25 MG tablet TAKE 1/2 TABLET BY MOUTH 2 TIMES A DAY 02/10/16   Historical Provider, MD  nitroGLYCERIN (NITROSTAT) 0.4 MG SL tablet Place 1 tablet (0.4 mg total) under the tongue every 5 (five) minutes as needed for chest pain (up to 3 doses). 04/13/12   Dayna N Dunn, PA-C  nortriptyline (PAMELOR) 10 MG capsule Take 20 mg by mouth at bedtime.  07/24/13   Historical Provider, MD  ondansetron (ZOFRAN) 4 MG tablet Take 1 tablet (4 mg total) by mouth every 8 (eight) hours as needed for nausea or vomiting. 01/06/16   Courteney Lyn Mackuen, MD  polyethylene glycol (MIRALAX / GLYCOLAX) packet Take 17 g by mouth daily as needed for mild constipation.    Historical Provider, MD  polyvinyl alcohol (LIQUIFILM TEARS) 1.4 % ophthalmic solution Place 1 drop into both eyes daily as needed.     Historical Provider, MD  predniSONE (DELTASONE) 10 MG tablet Take 1.5 tablets (15 mg total) by mouth daily with breakfast. 09/09/16   Collene Gobble, MD  predniSONE (DELTASONE) 20 MG tablet TAKE 1 TABLET BY MOUTH DAILY WITH BREAKFAST Patient not taking: Reported on 09/09/2016 05/11/16   Collene Gobble, MD  PROAIR HFA 108 [phone] Base) MCG/ACT inhaler INHALE 2 PUFFS INTO THE LUNGS EVERY 6 (SIX)  HOURS AS NEEDED FOR WHEEZING OR SHORTNESS OF BREATH. 07/20/16   Biagio Borg, MD  tiotropium (SPIRIVA HANDIHALER) 18 MCG inhalation capsule Place 1 capsule (18 mcg total) into inhaler and inhale daily at 6 (six) AM. 05/26/16   Biagio Borg, MD  traMADol (ULTRAM) 50 MG tablet Take 50 mg by mouth every 6 (six) hours as needed for moderate pain.     Historical Provider, MD  vitamin B-12 (CYANOCOBALAMIN) 1000 MCG tablet Take 1,000 mcg by mouth daily.    Historical Provider, MD    Physical Exam: Vitals:   09/10/16 1244 09/10/16 1435  BP:  129/69  Pulse:  (!) 121  Resp:  20  SpO2:  99%  Weight: 68 kg (150 lb)   Height: 5\' 6"  (1.676 m)      General: Appears calm and comfortable Eyes: PERRLA, EOMI, normal lids, iris ENT:  grossly normal hearing, lips & tongue, mucous membranes moist and intact Neck: no lymphoadenopathy, masses or thyromegaly Cardiovascular: RRR, no m/r/g. No JVD, carotid bruits. No LE edema.  Respiratory: bilateral no wheezes, rales, rhonchi or cracles. Normal respiratory effort. No accessory muscle use observed Abdomen: soft, non-tender, non-distended, no organomegaly or masses appreciated. BS present in all quadrants Skin: no rash, ulcers or induration seen on limited exam Musculoskeletal: grossly normal tone BUE/BLE, good ROM, no bony abnormality or joint deformities observed Psychiatric: grossly normal mood and affect, speech fluent and appropriate, alert and oriented x3 Neurologic: CN II-XII grossly intact, moves all extremities in coordinated fashion, sensation intact  Labs on Admission: I have personally reviewed following labs and imaging studies  CBC, BMP  GFR: Estimated Creatinine Clearance: 41.4 mL/min (A) (by C-G formula based on SCr of 1.1 mg/dL (H)).   Creatinine Clearance: Estimated Creatinine Clearance: 41.4 mL/min (A) (by C-G formula based on SCr of 1.1 mg/dL (H)).    Radiological Exams on Admission: Ct Angio Chest Pe W And/or Wo Contrast  Result  Date: 09/10/2016 CLINICAL DATA:  Sternal chest pain, back pain, known compression fractures. EXAM: CT ANGIOGRAPHY CHEST WITH  CONTRAST TECHNIQUE: Multidetector CT imaging of the chest was performed using the standard protocol during bolus administration of intravenous contrast. Multiplanar CT image reconstructions and MIPs were obtained to evaluate the vascular anatomy. CONTRAST:  100 cc Isovue 370 IV COMPARISON:  Thoracic spine MRI 09/07/2016, chest radiograph 06/30/2016. Chest CT 01/06/2016 FINDINGS: Cardiovascular: No aortic dissection or aneurysm, mild aortic atherosclerosis. The heart is normal in size. There are no filling defects within the pulmonary arteries to suggest pulmonary embolus. Coronary artery calcifications versus stents. Mediastinum/Nodes: No mediastinal or hilar adenopathy. Calcified right thyroid nodule is again seen. The esophagus is decompressed. Lungs/Pleura: Advanced emphysema. Mild central bronchial thickening. Previous 3 mm pleural-based nodule is grossly stable, image 48 series 6, less well-defined currently. No new pulmonary nodule. No consolidation. No pulmonary edema. No pneumothorax or pleural fluid. Upper Abdomen: Ingested material within the stomach. No acute abnormality. Musculoskeletal: T10 and T5 vertebral compression fractures with augmentation. T11 compression fracture involving superior endplate, characterized on recent MRI. Sternum is intact. No acute osseous abnormality. Review of the MIP images confirms the above findings. IMPRESSION: 1. No pulmonary embolus. 2. Advanced emphysema.  Central bronchial thickening is chronic. 3. Mild aortic atherosclerosis.  Coronary artery calcifications. 4. Thoracic spine compression fractures, T10 and T5 treated with vertebral augmentation. T11 compression fracture, as characterized on recent MRI. Electronically Signed   By: Jeb Levering M.D.   On: 09/10/2016 03:52    EKG: not found  Assessment/Plan Principal Problem:    Intractable back pain Active Problems:   Hyperlipidemia   Essential hypertension   COPD (chronic obstructive pulmonary disease) with emphysema (HCC)   Chronic respiratory failure (HCC)   Chest pain    Intractable back pain associated with acute T11 compression fracture Scheduled for the kyphoplasty by IR-Dr. Earleen Newport on Tuesday, 09/14/2016 Start IV opioid and made her pain level Percocet was given yesterday in the ED without improvement in pain  Chest pain in patient with known history of CAD  Will cycle troponin, monitor on telemetry  Chronic respiratory failure with O2 dependent COPD Continue supplemental oxygen Patient is on chronic steroid use this could explain white blood cells count elevation Continue nebulizer treatment is needed  Hypertension - currently stable Continue home medication and adjust the doses if needed depending on the BP readings  Hyperlipidemia  Continue statin therapy     DVT prophylaxis: Heparin Code Status: Partial  Family Communication: at bedside Disposition Plan: MedSurg Consults called: none Admission status: inpatient   York Grice, [place] Pager: [phone] [facility]  If 7PM-7AM, please contact night-coverage [email or web address] Password [email or web address]  09/10/2016, 3:06 PM

## 2016-09-10 NOTE — Progress Notes (Signed)
New Admission Note:  Arrival Method: stretcher  Mental Orientation: A & O x 4 Telemetry: box 21/ sinus tach  Assessment: Completed Skin: abrasions on both elbows covered with thin film  IV: R AC/ saline locked  Pain: dilaudid given 0.5mg  Tubes:n/a Safety Measures: Safety Fall Prevention Plan was given, discussed Admission: Completed 5M20: Patient has been orientated to the room, unit and the staff. Family: visiting  Orders have been reviewed and implemented. Will continue to monitor the patient. Call light has been placed within reach and bed alarm has been activated.   Arta Silence ,RN

## 2016-09-10 NOTE — ED Notes (Signed)
Admitting at bedside 

## 2016-09-10 NOTE — ED Provider Notes (Signed)
Patient just received 120 tramadol on 09/03/16 from [facility] from pain managment.  It would be unwise to prescribe further narcotics with her advanced COPD   Alexis Veracruz, MD 09/10/16 [phone]

## 2016-09-10 NOTE — ED Triage Notes (Signed)
Per EMS, patient from Home.  Pt got out of bed x 6 days ago with severe back pain.  MRI was completed three days ago that showed T11 Fracture.  Seen in ED yesterday for pain. Discharged with prescriptions.  No relief.  Has a procedure planned for Tuesday.  Patient here for pain management and possible admission until Tuesday's procedure.  190/124, 130 HR, RR 20, 98% 2 L.  Uses 2L at home for COPD.

## 2016-09-10 NOTE — Progress Notes (Signed)
Assistance called to room by patient's daughter. Patient's daughter states patient feels nauseated, states she took medications for pain and did not take zofran and that this made her feel like vomiting. Zofran administered. Cold compress placed on patient's forehead, given sprite and saltines. Laverda Sorenson, RN notified

## 2016-09-10 NOTE — Progress Notes (Signed)
Troponin level of 0.04 called from lab. MD page notified. Will continue to monitor.

## 2016-09-11 DIAGNOSIS — R079 Chest pain, unspecified: Secondary | ICD-10-CM

## 2016-09-11 LAB — CBC
HCT: 42.2 % (ref 36.0–46.0)
HEMOGLOBIN: 12.8 g/dL (ref 12.0–15.0)
MCH: 28.7 pg (ref 26.0–34.0)
MCHC: 30.3 g/dL (ref 30.0–36.0)
MCV: 94.6 fL (ref 78.0–100.0)
Platelets: 272 10*3/uL (ref 150–400)
RBC: 4.46 MIL/uL (ref 3.87–5.11)
RDW: 14 % (ref 11.5–15.5)
WBC: 11.8 10*3/uL — AB (ref 4.0–10.5)

## 2016-09-11 LAB — BASIC METABOLIC PANEL
ANION GAP: 10 (ref 5–15)
BUN: 12 mg/dL (ref 6–20)
CO2: 36 mmol/L — ABNORMAL HIGH (ref 22–32)
Calcium: 9.1 mg/dL (ref 8.9–10.3)
Chloride: 97 mmol/L — ABNORMAL LOW (ref 101–111)
Creatinine, Ser: 1.1 mg/dL — ABNORMAL HIGH (ref 0.44–1.00)
GFR, EST AFRICAN AMERICAN: 55 mL/min — AB (ref >60)
GFR, EST NON AFRICAN AMERICAN: 48 mL/min — AB (ref >60)
Glucose, Bld: 116 mg/dL — ABNORMAL HIGH (ref 65–99)
Potassium: 4.3 mmol/L (ref 3.5–5.1)
SODIUM: 143 mmol/L (ref 135–145)

## 2016-09-11 LAB — TROPONIN I: TROPONIN I: 0.04 ng/mL — AB (ref <0.03)

## 2016-09-11 MED ORDER — ONDANSETRON HCL 4 MG/2ML IJ SOLN
4.0000 mg | INTRAMUSCULAR | Status: DC | PRN
  Administered 2016-09-11 – 2016-09-12 (×4): 4 mg via INTRAVENOUS
  Filled 2016-09-11 (×5): qty 2

## 2016-09-11 MED ORDER — HYDROMORPHONE HCL 1 MG/ML IJ SOLN
1.0000 mg | INTRAMUSCULAR | Status: DC | PRN
  Administered 2016-09-11 – 2016-09-12 (×8): 1 mg via INTRAVENOUS
  Filled 2016-09-11 (×10): qty 1

## 2016-09-11 MED ORDER — PROMETHAZINE HCL 25 MG PO TABS
12.5000 mg | ORAL_TABLET | ORAL | Status: DC | PRN
  Administered 2016-09-11 – 2016-09-12 (×4): 12.5 mg via ORAL
  Filled 2016-09-11 (×5): qty 1

## 2016-09-11 MED ORDER — HEPARIN SODIUM (PORCINE) 5000 UNIT/ML IJ SOLN
5000.0000 [IU] | Freq: Three times a day (TID) | INTRAMUSCULAR | Status: DC
  Administered 2016-09-11 – 2016-09-13 (×6): 5000 [IU] via SUBCUTANEOUS
  Filled 2016-09-11 (×6): qty 1

## 2016-09-11 NOTE — Progress Notes (Signed)
Patient's family member does not want the patient to be waken up for care. They do no want vitals signs or any sort of care whiles patient is  sleep

## 2016-09-11 NOTE — Progress Notes (Signed)
Patient called for pain medicine and wanted to use the bathroom as well.Pain medicine was given per orders. Husband and patient does not want staff to help the patient to the bedside commode. They want the door shut.I have encourage them to call anytime they need my assistance.

## 2016-09-11 NOTE — Progress Notes (Addendum)
PROGRESS NOTE  Alexis Higgins ZLD:[id number] DOB: 08-01-1940 DOA: 09/10/2016 PCP: Cathlean Cower, MD   LOS: 1 day   Brief Narrative: 76 year old with COPD steroid dependent, osteoporosis, recurrent thoracic vertebrae fractures, recently diagnosed with a T11 compression fracture is days ago, who presented to the emergency room twice due to excruciating pain unable to be controlled by oral pain medications at home.  Assessment & Plan: Principal Problem:   Intractable back pain Active Problems:   Hyperlipidemia   Essential hypertension   COPD (chronic obstructive pulmonary disease) with emphysema (HCC)   Chronic respiratory failure (HCC)   Chest pain   Intractable back pain due to T11 fracture -She actually reports pain radiating from the back to flanks and it is actually mostly in the front of her chest.  This is similar to her fracture pains that she has had in the past.  It is worse with movement.  She is quite uncomfortable in the room, barely able to ambulate.  P.o. pain medications have not helped at all, and she is extremely uncomfortable this morning, will increase IV pain medication today -She had a scheduled kyphoplasty by IR on Tuesday.  Have asked IR to look into see if there is a remote possibility that she gets her kyphoplasty done a day earlier on Monday  COPD with chronic hypoxic respiratory failure and steroid dependence -She uses oxygen at home, continue -She has no wheezing on exam, continue home medications -Continue steroids  Hypertension -Continue her home metoprolol, Lasix  Hyperlipidemia -Continue home pravastatin  Coronary artery disease -Current chest pain is likely due to the fracture as it is very atypical to be cardiac in origin.  Is also dependent on movement.  Troponins remained flat at 0.04 and not in a pattern consistent with ACS.   DVT prophylaxis: heparin Code Status: Full code Family Communication: Discussed with husband at bedside Disposition  Plan: Home when ready  Consultants:   IR  Procedures:   none  Antimicrobials:  none   Subjective: -Severe pain with every movement, pain is located epigastric lower chest  Objective: Vitals:   09/10/16 2148 09/10/16 2207 09/11/16 0446 09/11/16 0952  BP:  (!) 149/93 132/69 114/64  Pulse:  (!) 50 86 97  Resp:  20 20 18   Temp:  98.1 F (36.7 C) 98.3 F (36.8 C) 98.7 F (37.1 C)  TempSrc:  Oral Oral Oral  SpO2: 100% 92% 100% 100%  Weight:      Height:        Intake/Output Summary (Last 24 hours) at 09/11/16 1105 Last data filed at 09/10/16 2210  Gross per 24 hour  Intake                0 ml  Output                1 ml  Net               -1 ml   Filed Weights   09/10/16 1244  Weight: 68 kg (150 lb)    Examination: Constitutional: NAD Vitals:   09/10/16 2148 09/10/16 2207 09/11/16 0446 09/11/16 0952  BP:  (!) 149/93 132/69 114/64  Pulse:  (!) 50 86 97  Resp:  20 20 18   Temp:  98.1 F (36.7 C) 98.3 F (36.8 C) 98.7 F (37.1 C)  TempSrc:  Oral Oral Oral  SpO2: 100% 92% 100% 100%  Weight:      Height:       Eyes: lids  and conjunctivae normal Respiratory: clear to auscultation bilaterally, no wheezing, no crackles. Cardiovascular: Regular rate and rhythm, no murmurs / rubs / gallops. No LE edema.  Abdomen: no tenderness. Bowel sounds positive.  Musculoskeletal: no clubbing / cyanosis.  Skin: no rashes, lesions, ulcers.  Neurologic: Nonfocal   Data Reviewed: I have personally reviewed following labs and imaging studies  CBC:  Recent Labs Lab 09/09/16 2245 09/11/16 0312  WBC 19.1* 11.8*  NEUTROABS 15.9*  --   HGB 13.2 12.8  HCT 40.6 42.2  MCV 90.6 94.6  PLT 233 443   Basic Metabolic Panel:  Recent Labs Lab 09/07/16 0728 09/09/16 2245 09/10/16 1538 09/11/16 0312  NA  --  145  --  143  K  --  3.2*  --  4.3  CL  --  99*  --  97*  CO2  --  33*  --  36*  GLUCOSE  --  130*  --  116*  BUN  --  12  --  12  CREATININE 1.00 1.10* 1.01*  1.10*  CALCIUM  --  9.1  --  9.1   GFR: Estimated Creatinine Clearance: 41.4 mL/min (A) (by C-G formula based on SCr of 1.1 mg/dL (H)). Liver Function Tests: No results for input(s): AST, ALT, ALKPHOS, BILITOT, PROT, ALBUMIN in the last 168 hours. No results for input(s): LIPASE, AMYLASE in the last 168 hours. No results for input(s): AMMONIA in the last 168 hours. Coagulation Profile: No results for input(s): INR, PROTIME in the last 168 hours. Cardiac Enzymes:  Recent Labs Lab 09/10/16 1538 09/10/16 2119 09/11/16 0312  TROPONINI 0.04* 0.04* 0.04*   BNP (last 3 results) No results for input(s): PROBNP in the last 8760 hours. HbA1C: No results for input(s): HGBA1C in the last 72 hours. CBG: No results for input(s): GLUCAP in the last 168 hours. Lipid Profile: No results for input(s): CHOL, HDL, LDLCALC, TRIG, CHOLHDL, LDLDIRECT in the last 72 hours. Thyroid Function Tests: No results for input(s): TSH, T4TOTAL, FREET4, T3FREE, THYROIDAB in the last 72 hours. Anemia Panel: No results for input(s): VITAMINB12, FOLATE, FERRITIN, TIBC, IRON, RETICCTPCT in the last 72 hours. Urine analysis:    Component Value Date/Time   COLORURINE YELLOW 08/01/2015 1050   APPEARANCEUR CLEAR 08/01/2015 1050   LABSPEC <=1.005 (A) 08/01/2015 1050   PHURINE 6.0 08/01/2015 1050   GLUCOSEU NEGATIVE 08/01/2015 1050   HGBUR NEGATIVE 08/01/2015 1050   BILIRUBINUR NEGATIVE 08/01/2015 1050   KETONESUR NEGATIVE 08/01/2015 1050   PROTEINUR NEGATIVE 11/22/2013 2000   UROBILINOGEN 0.2 08/01/2015 1050   NITRITE NEGATIVE 08/01/2015 1050   LEUKOCYTESUR NEGATIVE 08/01/2015 1050   Sepsis Labs: Invalid input(s): PROCALCITONIN, LACTICIDVEN  No results found for this or any previous visit (from the past 240 hour(s)).    Radiology Studies: Ct Angio Chest Pe W And/or Wo Contrast  Result Date: 09/10/2016 CLINICAL DATA:  Sternal chest pain, back pain, known compression fractures. EXAM: CT ANGIOGRAPHY CHEST  WITH CONTRAST TECHNIQUE: Multidetector CT imaging of the chest was performed using the standard protocol during bolus administration of intravenous contrast. Multiplanar CT image reconstructions and MIPs were obtained to evaluate the vascular anatomy. CONTRAST:  100 cc Isovue 370 IV COMPARISON:  Thoracic spine MRI 09/07/2016, chest radiograph 06/30/2016. Chest CT 01/06/2016 FINDINGS: Cardiovascular: No aortic dissection or aneurysm, mild aortic atherosclerosis. The heart is normal in size. There are no filling defects within the pulmonary arteries to suggest pulmonary embolus. Coronary artery calcifications versus stents. Mediastinum/Nodes: No mediastinal or hilar adenopathy. Calcified right  thyroid nodule is again seen. The esophagus is decompressed. Lungs/Pleura: Advanced emphysema. Mild central bronchial thickening. Previous 3 mm pleural-based nodule is grossly stable, image 48 series 6, less well-defined currently. No new pulmonary nodule. No consolidation. No pulmonary edema. No pneumothorax or pleural fluid. Upper Abdomen: Ingested material within the stomach. No acute abnormality. Musculoskeletal: T10 and T5 vertebral compression fractures with augmentation. T11 compression fracture involving superior endplate, characterized on recent MRI. Sternum is intact. No acute osseous abnormality. Review of the MIP images confirms the above findings. IMPRESSION: 1. No pulmonary embolus. 2. Advanced emphysema.  Central bronchial thickening is chronic. 3. Mild aortic atherosclerosis.  Coronary artery calcifications. 4. Thoracic spine compression fractures, T10 and T5 treated with vertebral augmentation. T11 compression fracture, as characterized on recent MRI. Electronically Signed   By: Jeb Levering M.D.   On: 09/10/2016 03:52     Scheduled Meds: . cholecalciferol  1,000 Units Oral QPM  . docusate sodium  100 mg Oral Daily  . furosemide  20 mg Oral Daily  . heparin  5,000 Units Subcutaneous Q8H  .  isosorbide mononitrate  30 mg Oral Daily  . metoprolol tartrate  25 mg Oral BID  . nortriptyline  20 mg Oral QHS  . polyethylene glycol  17 g Oral Daily  . pravastatin  20 mg Oral q1800  . predniSONE  20 mg Oral Q breakfast  . sodium chloride flush  3 mL Intravenous Q12H  . tiotropium  18 mcg Inhalation Daily   Continuous Infusions:     Time spent: 25 minutes, more than 50% at bedside discussing with the patient and the patient's husband    Marzetta Board, MD, PhD [facility] Pager [phone]  If 7PM-7AM, please contact night-coverage [email or web address] Password [email or web address] 09/11/2016, 11:05 AM

## 2016-09-11 NOTE — Progress Notes (Signed)
Patient ID: Alexis Higgins, female   DOB: 1941-03-08, 76 y.o.   MRN: [id number] Aware of patient's admission for persistent back pain secondary to T11 compression fracture. She is scheduled for kyphoplasty with our service on 4/24. Unfortunately Dr.  Estanislado Pandy is out-of-town next week and there will be no other IR provider available to do procedure until 4/24. We will plan to follow-up with pt over next few days to discuss case further.

## 2016-09-12 DIAGNOSIS — M4854XA Collapsed vertebra, not elsewhere classified, thoracic region, initial encounter for fracture: Principal | ICD-10-CM

## 2016-09-12 MED ORDER — HYDROMORPHONE HCL 2 MG PO TABS
1.0000 mg | ORAL_TABLET | ORAL | Status: DC | PRN
  Administered 2016-09-12 – 2016-09-15 (×6): 1 mg via ORAL
  Filled 2016-09-12 (×9): qty 1

## 2016-09-12 MED ORDER — ONDANSETRON HCL 4 MG/2ML IJ SOLN
2.0000 mg | INTRAMUSCULAR | Status: DC | PRN
  Administered 2016-09-12 – 2016-09-14 (×8): 2 mg via INTRAVENOUS
  Filled 2016-09-12 (×9): qty 2

## 2016-09-12 MED ORDER — PREDNISONE 5 MG PO TABS
15.0000 mg | ORAL_TABLET | Freq: Every day | ORAL | Status: DC
  Administered 2016-09-12 – 2016-09-15 (×4): 15 mg via ORAL
  Filled 2016-09-12 (×5): qty 3

## 2016-09-12 MED ORDER — SENNOSIDES-DOCUSATE SODIUM 8.6-50 MG PO TABS
2.0000 | ORAL_TABLET | Freq: Two times a day (BID) | ORAL | Status: DC
  Administered 2016-09-12 – 2016-09-13 (×4): 2 via ORAL
  Filled 2016-09-12 (×6): qty 2

## 2016-09-12 MED ORDER — HYDROMORPHONE HCL 2 MG PO TABS
1.0000 mg | ORAL_TABLET | ORAL | Status: DC | PRN
  Administered 2016-09-12: 1 mg via ORAL
  Filled 2016-09-12: qty 1

## 2016-09-12 MED ORDER — HYDROMORPHONE HCL 1 MG/ML IJ SOLN
0.7500 mg | INTRAMUSCULAR | Status: DC | PRN
  Administered 2016-09-12 – 2016-09-15 (×10): 0.75 mg via INTRAVENOUS
  Filled 2016-09-12 (×11): qty 1

## 2016-09-12 NOTE — Progress Notes (Signed)
PROGRESS NOTE  CIIN BRAZZEL GEX:[id number] DOB: [birth year] DOA: 09/10/2016 PCP: Cathlean Cower, MD   LOS: 2 days   Brief Narrative: 76 year old with COPD steroid dependent, osteoporosis, recurrent thoracic vertebrae fractures, recently diagnosed with a T11 compression fracture is days ago, who presented to the emergency room twice due to excruciating pain unable to be controlled by oral pain medications at home.  Assessment & Plan: Principal Problem:   Intractable back pain Active Problems:   Hyperlipidemia   Essential hypertension   COPD (chronic obstructive pulmonary disease) with emphysema (HCC)   Chronic respiratory failure (HCC)   Chest pain   Intractable back pain due to T11 fracture -She actually reports pain radiating from the back to flanks and it is actually mostly in the front of her chest.  This is similar to her fracture pains that she has had in the past.  -Pain is better on the current IV pain regimen, however patient seems a little bit more drowsy and husband is concerned, will decrease the IV pain medications.  She continues to be nonambulatory and excruciating pain -She had a scheduled kyphoplasty by IR on Tuesday. -IR could not do the procedure on Monday, so is remains to be planned for Tuesday  COPD with chronic hypoxic respiratory failure and steroid dependence -She uses oxygen at home, continue -She has no wheezing on exam, continue home medications -Continue steroids  Hypertension -Continue her home metoprolol, Lasix  Hyperlipidemia -Continue home pravastatin  Coronary artery disease -Current chest pain is likely due to the fracture as it is very atypical to be cardiac in origin.  Is also dependent on movement.  Troponins remained flat at 0.04 and not in a pattern consistent with ACS.   DVT prophylaxis: heparin Code Status: Full code Family Communication: Discussed with husband at bedside Disposition Plan: Home when ready  Consultants:    IR  Procedures:   none  Antimicrobials:  none   Subjective: -Severe pain with every movement, pain is located epigastric lower chest  Objective: Vitals:   09/12/16 0136 09/12/16 0543 09/12/16 1016 09/12/16 1350  BP: 100/62 120/62 113/71 123/74  Pulse: 90 93 91 92  Resp: 16 17 16 16   Temp: 98.2 F (36.8 C) 98.4 F (36.9 C) 98.2 F (36.8 C) 97.5 F (36.4 C)  TempSrc: Axillary Oral Oral Oral  SpO2: 100% 100% 98% 100%  Weight:      Height:        Intake/Output Summary (Last 24 hours) at 09/12/16 1427 Last data filed at 09/12/16 0800  Gross per 24 hour  Intake               60 ml  Output                0 ml  Net               60 ml   Filed Weights   09/10/16 1244  Weight: 68 kg (150 lb)    Examination: Constitutional: NAD Vitals:   09/12/16 0136 09/12/16 0543 09/12/16 1016 09/12/16 1350  BP: 100/62 120/62 113/71 123/74  Pulse: 90 93 91 92  Resp: 16 17 16 16   Temp: 98.2 F (36.8 C) 98.4 F (36.9 C) 98.2 F (36.8 C) 97.5 F (36.4 C)  TempSrc: Axillary Oral Oral Oral  SpO2: 100% 100% 98% 100%  Weight:      Height:       Eyes: Lids and conjunctivae normal Respiratory: CTA biL, no wheezing  Cardiovascular: RRR,  no MRG Abdomen: no tenderness. Bowel sounds positive.  Musculoskeletal: no clubbing / cyanosis.  Skin: no rashes, lesions, ulcers.   Neurologic: Nonfocal   Data Reviewed: I have personally reviewed following labs and imaging studies  CBC:  Recent Labs Lab 09/09/16 2245 09/11/16 0312  WBC 19.1* 11.8*  NEUTROABS 15.9*  --   HGB 13.2 12.8  HCT 40.6 42.2  MCV 90.6 94.6  PLT 233 947   Basic Metabolic Panel:  Recent Labs Lab 09/07/16 0728 09/09/16 2245 09/10/16 1538 09/11/16 0312  NA  --  145  --  143  K  --  3.2*  --  4.3  CL  --  99*  --  97*  CO2  --  33*  --  36*  GLUCOSE  --  130*  --  116*  BUN  --  12  --  12  CREATININE 1.00 1.10* 1.01* 1.10*  CALCIUM  --  9.1  --  9.1   GFR: Estimated Creatinine Clearance: 41.4  mL/min (A) (by C-G formula based on SCr of 1.1 mg/dL (H)). Liver Function Tests: No results for input(s): AST, ALT, ALKPHOS, BILITOT, PROT, ALBUMIN in the last 168 hours. No results for input(s): LIPASE, AMYLASE in the last 168 hours. No results for input(s): AMMONIA in the last 168 hours. Coagulation Profile: No results for input(s): INR, PROTIME in the last 168 hours. Cardiac Enzymes:  Recent Labs Lab 09/10/16 1538 09/10/16 2119 09/11/16 0312  TROPONINI 0.04* 0.04* 0.04*   BNP (last 3 results) No results for input(s): PROBNP in the last 8760 hours. HbA1C: No results for input(s): HGBA1C in the last 72 hours. CBG: No results for input(s): GLUCAP in the last 168 hours. Lipid Profile: No results for input(s): CHOL, HDL, LDLCALC, TRIG, CHOLHDL, LDLDIRECT in the last 72 hours. Thyroid Function Tests: No results for input(s): TSH, T4TOTAL, FREET4, T3FREE, THYROIDAB in the last 72 hours. Anemia Panel: No results for input(s): VITAMINB12, FOLATE, FERRITIN, TIBC, IRON, RETICCTPCT in the last 72 hours. Urine analysis:    Component Value Date/Time   COLORURINE YELLOW 08/01/2015 1050   APPEARANCEUR CLEAR 08/01/2015 1050   LABSPEC <=1.005 (A) 08/01/2015 1050   PHURINE 6.0 08/01/2015 1050   GLUCOSEU NEGATIVE 08/01/2015 1050   HGBUR NEGATIVE 08/01/2015 1050   BILIRUBINUR NEGATIVE 08/01/2015 1050   KETONESUR NEGATIVE 08/01/2015 1050   PROTEINUR NEGATIVE 11/22/2013 2000   UROBILINOGEN 0.2 08/01/2015 1050   NITRITE NEGATIVE 08/01/2015 1050   LEUKOCYTESUR NEGATIVE 08/01/2015 1050   Sepsis Labs: Invalid input(s): PROCALCITONIN, LACTICIDVEN  No results found for this or any previous visit (from the past 240 hour(s)).    Radiology Studies: No results found.   Scheduled Meds: . cholecalciferol  1,000 Units Oral QPM  . docusate sodium  100 mg Oral Daily  . furosemide  20 mg Oral Daily  . heparin  5,000 Units Subcutaneous Q8H  . isosorbide mononitrate  30 mg Oral Daily  .  metoprolol tartrate  25 mg Oral BID  . nortriptyline  20 mg Oral QHS  . polyethylene glycol  17 g Oral Daily  . pravastatin  20 mg Oral q1800  . predniSONE  15 mg Oral Q breakfast  . senna-docusate  2 tablet Oral BID  . sodium chloride flush  3 mL Intravenous Q12H  . tiotropium  18 mcg Inhalation Daily   Continuous Infusions:     Time spent: Again 25 minutes, more than 50% at bedside discussing with the patient and the patient's husband regarding her multiple  complaints and ongoing pain    Marzetta Board, MD, PhD [facility] Pager [phone]  If 7PM-7AM, please contact night-coverage [email or web address] Password [facility] 09/12/2016, 2:27 PM

## 2016-09-12 NOTE — Progress Notes (Signed)
Pt was medicated for pain with dilaudid 1/2 tab , 1 mg . Pt took medication with senna and other night medication . Pt took senna and the dilaudid first  Before the rest ofv her night medication  But stated she did not take the dilaudid it might have fallen on top of the blanket . While RN and her husband at bedside before pt took the med. RN look on the bed, shook off the blanket and top sheet   but no medication was found. No medication found on the floor as well. Then pt admit she might has taken it. The husband also confirm she has taken it but pt  Said"  I  might have". Within 25 minute  pt ask when is my IV dilaudid due,. RN told her she will re assess and give her pain med if she is not relief of pain.

## 2016-09-13 LAB — CBC
HCT: 34.4 % — ABNORMAL LOW (ref 36.0–46.0)
Hemoglobin: 10.6 g/dL — ABNORMAL LOW (ref 12.0–15.0)
MCH: 28.2 pg (ref 26.0–34.0)
MCHC: 30.8 g/dL (ref 30.0–36.0)
MCV: 91.5 fL (ref 78.0–100.0)
PLATELETS: 227 10*3/uL (ref 150–400)
RBC: 3.76 MIL/uL — AB (ref 3.87–5.11)
RDW: 13.7 % (ref 11.5–15.5)
WBC: 8.3 10*3/uL (ref 4.0–10.5)

## 2016-09-13 LAB — BASIC METABOLIC PANEL
Anion gap: 7 (ref 5–15)
BUN: 13 mg/dL (ref 6–20)
CALCIUM: 8.7 mg/dL — AB (ref 8.9–10.3)
CO2: 34 mmol/L — AB (ref 22–32)
CREATININE: 0.99 mg/dL (ref 0.44–1.00)
Chloride: 97 mmol/L — ABNORMAL LOW (ref 101–111)
GFR calc non Af Amer: 54 mL/min — ABNORMAL LOW (ref >60)
GLUCOSE: 124 mg/dL — AB (ref 65–99)
Potassium: 4.6 mmol/L (ref 3.5–5.1)
Sodium: 138 mmol/L (ref 135–145)

## 2016-09-13 NOTE — Care Management Note (Signed)
Case Management Note  Patient Details  Name: Alexis Higgins MRN: [id number] Date of Birth: 1940-08-28  Subjective/Objective:                    Action/Plan: CM consulted for [facility] services. CM met with the patient and her husband and went over [facility] services and provided them a list of [place] agencies. They selected [facility] who she has her oxygen through. [facility] unable to accept the referral. CM went back and asked the pts husband for a second choice. He selected [facility]. Mary with [facility] notified and accepted the referral.  Pt to have surgery tomorrow. CM following for further d/c needs.   Expected Discharge Date:                  Expected Discharge Plan:  [place]  In-House Referral:     Discharge planning Services  CM Consult  Post Acute Care Choice:  Home Health Choice offered to:  Patient, Spouse  DME Arranged:    DME Agency:     HH Arranged:  RN, PT [place] Agency:  [facility] (now [facility])  Status of Service:  In process, will continue to follow  If discussed at Long Length of Stay Meetings, dates discussed:    Additional Comments:  Pollie Friar, RN 09/13/2016, 12:17 PM

## 2016-09-13 NOTE — Progress Notes (Signed)
PROGRESS NOTE  Alexis Higgins YSA:[id number] DOB: 03-Dec-1940 DOA: 09/10/2016 PCP: Cathlean Cower, MD   LOS: 3 days   Brief Narrative: 76 year old with COPD steroid dependent, osteoporosis, recurrent thoracic vertebrae fractures, recently diagnosed with a T11 compression fracture is days ago, who presented to the emergency room twice due to excruciating pain unable to be controlled by oral pain medications at home.  Assessment & Plan: Principal Problem:   Intractable back pain Active Problems:   Hyperlipidemia   Essential hypertension   COPD (chronic obstructive pulmonary disease) with emphysema (HCC)   Chronic respiratory failure (HCC)   Chest pain   Intractable back pain due to T11 fracture -She actually reports pain radiating from the back to flanks and it is actually mostly in the front of her chest.  This is similar to her fracture pains that she has had in the past.  -patient was drowsy over the weekend on IV pain medication, decreased frequency and increased po pills availability, more awake today. Pain controlled while still in bed and severe with movement. Cannot ambulate.  -kyphoplasty scheduled for tomorrow  COPD with chronic hypoxic respiratory failure and steroid dependence -She uses oxygen at home, continue -She has no wheezing on exam, continue home medications -Continue steroids  Hypertension -Continue her home metoprolol, Lasix  Hyperlipidemia -Continue home pravastatin  Coronary artery disease -Current chest pain is likely due to the fracture as it is very atypical to be cardiac in origin.  Is also dependent on movement.  Troponins remained flat at 0.04 and not in a pattern consistent with ACS.   DVT prophylaxis: heparin Code Status: Full code Family Communication: Discussed with husband at bedside Disposition Plan: Home after kyphoplasty   Consultants:   IR  Procedures:   none  Antimicrobials:  none   Subjective: -more comfortable this  morning, severe pain with little movement  Objective: Vitals:   09/12/16 2053 09/12/16 2153 09/13/16 0052 09/13/16 0528  BP: 123/65 123/65 (!) 101/57 (!) 102/52  Pulse: 99 99 83 87  Resp: 18  16 16   Temp: 98.8 F (37.1 C)  98.6 F (37 C) 98.6 F (37 C)  TempSrc: Oral  Oral Oral  SpO2: 100%  100% 100%  Weight:      Height:        Intake/Output Summary (Last 24 hours) at 09/13/16 0845 Last data filed at 09/12/16 1800  Gross per 24 hour  Intake              120 ml  Output                0 ml  Net              120 ml   Filed Weights   09/10/16 1244  Weight: 68 kg (150 lb)    Examination: Constitutional: NAD Vitals:   09/12/16 2053 09/12/16 2153 09/13/16 0052 09/13/16 0528  BP: 123/65 123/65 (!) 101/57 (!) 102/52  Pulse: 99 99 83 87  Resp: 18  16 16   Temp: 98.8 F (37.1 C)  98.6 F (37 C) 98.6 F (37 C)  TempSrc: Oral  Oral Oral  SpO2: 100%  100% 100%  Weight:      Height:       Constitutional: NAD, calm, comfortable Neck: normal, supple Respiratory: clear to auscultation bilaterally, no wheezing, no crackles. Overall decreased breath sounds Cardiovascular: Regular rate and rhythm, no murmurs / rubs / gallops. No LE edema. 2+ pedal pulses.  Abdomen: no tenderness. Bowel  sounds positive.     Data Reviewed: I have personally reviewed following labs and imaging studies  CBC:  Recent Labs Lab 09/09/16 2245 09/11/16 0312 09/13/16 0505  WBC 19.1* 11.8* 8.3  NEUTROABS 15.9*  --   --   HGB 13.2 12.8 10.6*  HCT 40.6 42.2 34.4*  MCV 90.6 94.6 91.5  PLT 233 272 254   Basic Metabolic Panel:  Recent Labs Lab 09/07/16 0728 09/09/16 2245 09/10/16 1538 09/11/16 0312 09/13/16 0505  NA  --  145  --  143 138  K  --  3.2*  --  4.3 4.6  CL  --  99*  --  97* 97*  CO2  --  33*  --  36* 34*  GLUCOSE  --  130*  --  116* 124*  BUN  --  12  --  12 13  CREATININE 1.00 1.10* 1.01* 1.10* 0.99  CALCIUM  --  9.1  --  9.1 8.7*   GFR: Estimated Creatinine Clearance:  46 mL/min (by C-G formula based on SCr of 0.99 mg/dL). Liver Function Tests: No results for input(s): AST, ALT, ALKPHOS, BILITOT, PROT, ALBUMIN in the last 168 hours. No results for input(s): LIPASE, AMYLASE in the last 168 hours. No results for input(s): AMMONIA in the last 168 hours. Coagulation Profile: No results for input(s): INR, PROTIME in the last 168 hours. Cardiac Enzymes:  Recent Labs Lab 09/10/16 1538 09/10/16 2119 09/11/16 0312  TROPONINI 0.04* 0.04* 0.04*   BNP (last 3 results) No results for input(s): PROBNP in the last 8760 hours. HbA1C: No results for input(s): HGBA1C in the last 72 hours. CBG: No results for input(s): GLUCAP in the last 168 hours. Lipid Profile: No results for input(s): CHOL, HDL, LDLCALC, TRIG, CHOLHDL, LDLDIRECT in the last 72 hours. Thyroid Function Tests: No results for input(s): TSH, T4TOTAL, FREET4, T3FREE, THYROIDAB in the last 72 hours. Anemia Panel: No results for input(s): VITAMINB12, FOLATE, FERRITIN, TIBC, IRON, RETICCTPCT in the last 72 hours. Urine analysis:    Component Value Date/Time   COLORURINE YELLOW 08/01/2015 1050   APPEARANCEUR CLEAR 08/01/2015 1050   LABSPEC <=1.005 (A) 08/01/2015 1050   PHURINE 6.0 08/01/2015 1050   GLUCOSEU NEGATIVE 08/01/2015 1050   HGBUR NEGATIVE 08/01/2015 1050   BILIRUBINUR NEGATIVE 08/01/2015 1050   KETONESUR NEGATIVE 08/01/2015 1050   PROTEINUR NEGATIVE 11/22/2013 2000   UROBILINOGEN 0.2 08/01/2015 1050   NITRITE NEGATIVE 08/01/2015 1050   LEUKOCYTESUR NEGATIVE 08/01/2015 1050   Sepsis Labs: Invalid input(s): PROCALCITONIN, LACTICIDVEN  No results found for this or any previous visit (from the past 240 hour(s)).    Radiology Studies: No results found.   Scheduled Meds: . cholecalciferol  1,000 Units Oral QPM  . docusate sodium  100 mg Oral Daily  . furosemide  20 mg Oral Daily  . heparin  5,000 Units Subcutaneous Q8H  . isosorbide mononitrate  30 mg Oral Daily  . metoprolol  tartrate  25 mg Oral BID  . nortriptyline  20 mg Oral QHS  . polyethylene glycol  17 g Oral Daily  . pravastatin  20 mg Oral q1800  . predniSONE  15 mg Oral Q breakfast  . senna-docusate  2 tablet Oral BID  . sodium chloride flush  3 mL Intravenous Q12H  . tiotropium  18 mcg Inhalation Daily   Continuous Infusions:    Marzetta Board, MD, PhD [facility] Pager [phone]  If 7PM-7AM, please contact night-coverage [email or web address] Password [facility] 09/13/2016, 8:45 AM

## 2016-09-13 NOTE — Consult Note (Signed)
Chief Complaint: Patient was seen in consultation today for Thoracic 11 vertebroplasty/kyphoplasty Chief Complaint  Patient presents with  . Back Pain   at the request of Dr Wardell Heath  Referring Physician(s): DR Wardell Heath  Supervising Physician: Markus Daft  Patient Status: [facility] [place] - In-pt  History of Present Illness: Alexis Higgins is a 76 y.o. female   Severe back pain few weeks Even worsened over last few days Was scheduled for VP/KP as OP for 4/24 Had been seen by Dr Estanislado Pandy for same ----but with worsening pain; she has been admitted to Hospital for pain control. Pain has been somewhat relieved with IV medications; able to move about a little easier Pain still 8-9/10 From back to mid sternal area  Previous  T5 and T10 augmentation 01/2016 and 06/2016  TR[facility] requesting procedure asap for this pt-- now inpatient status Last dose Plavix 8 days ago Pt did eat breakfast today Did have Hep inj this am  Insurance is being evaluated and approved for inpatient status now Will keep npo now in case we san move forward today Probably 4/24 am---pt aware   Past Medical History:  Diagnosis Date  . Abnormal chest x-ray    03/2012: will need OP f/u.  Marland Kitchen Anginal pain ([place])   . ANXIETY 01/01/2007  . BURSITIS, RIGHT HIP 06/04/2009  . CAD (coronary artery disease)    a. BMS to LAD 2010. b. NSTEMI with DES to LAD for ISR 2011. c. Patent stent 03/2012/Imdur added.  . Cataract   . CHEST PAIN-PRECORDIAL 01/15/2009  . Colon polyps    H/o tubular adenoma of colon  . COPD 01/01/2007   a. Chronic resp failure on home O2.  Marland Kitchen DEPRESSION 01/01/2007  . Eczema 01/08/2011  . GROIN PAIN 06/20/2008  . Headache(784.0) 01/01/2007  . Hemorrhoid   . HYPERTENSION 01/01/2007  . Impaired glucose tolerance 01/07/2011  . LOW BACK PAIN 01/01/2007  . Muscle weakness (generalized) 06/04/2009  . On home oxygen therapy    "2L; 24/7" (09/10/2016)  . OSTEOARTHRITIS, HIP 07/01/2008  . OSTEOPOROSIS 01/01/2007  .  Pneumonia 1998  . Rosacea 01/08/2011  . Shortness of breath   . SYNCOPE 01/01/2007  . TIA (transient ischemic attack)   . TRANSIENT ISCHEMIC ATTACK, HX OF 01/01/2007    Past Surgical History:  Procedure Laterality Date  . ABDOMINAL HYSTERECTOMY    . APPENDECTOMY    . BACK SURGERY    . BREAST ENHANCEMENT SURGERY    . COLONOSCOPY  01/25/2002   tubular adenoma,hemorrhoids, hyperplastic  colon polyps  . COLONOSCOPY  02/17/2005   hemorrhoids  . CORONARY ANGIOPLASTY    . CORONARY STENT PLACEMENT    . HIP ARTHROPLASTY Left 10/01/2013   Procedure: ARTHROPLASTY UNIPOLAR   HIP;  Surgeon: Marianna Payment, MD;  Location: [place];  Service: Orthopedics;  Laterality: Left;  . HIP SURGERY Left    DR XU     PROXIMAL NECK   . IR GENERIC HISTORICAL  02/19/2016   IR VERTEBROPLASTY CERV/THOR BX INC UNI/BIL INC/INJECT/IMAGING 02/19/2016 Luanne Bras, MD [facility]INTER[facility]  . IR GENERIC HISTORICAL  07/15/2016   IR KYPHO THORACIC WITH BONE BIOPSY 07/15/2016 Luanne Bras, MD [facility]  . LEFT HEART CATHETERIZATION WITH CORONARY ANGIOGRAM N/A 04/13/2012   Procedure: LEFT HEART CATHETERIZATION WITH CORONARY ANGIOGRAM;  Surgeon: Burnell Blanks, MD;  Location: [facility] CATH LAB;  Service: Cardiovascular;  Laterality: N/A;  . OOPHORECTOMY     one ovary  . s/p bilat cataract  2010  .  sp lumbar disc surgury     [name]    Allergies: Aspirin; Codeine; and Other  Medications: Prior to Admission medications   Medication Sig Start Date End Date Taking? Authorizing Provider  ascorbic acid (VITAMIN C) 1000 MG tablet Take 1,000 mg by mouth daily.   Yes Historical Provider, MD  Cholecalciferol (VITAMIN D) 1000 UNITS capsule Take 1,000 Units by mouth every evening.    Yes Historical Provider, MD  clopidogrel (PLAVIX) 75 MG tablet TAKE 1 TABLET (75 MG TOTAL) BY MOUTH DAILY. 12/11/15  Yes Burnell Blanks, MD  diclofenac sodium (VOLTAREN) 1 % GEL Apply 4 g topically 4 (four) times daily. 09/10/16   Yes April Palumbo, MD  docusate sodium (COLACE) 100 MG capsule Take 100 mg by mouth daily as needed. For constipation   Yes Historical Provider, MD  furosemide (LASIX) 20 MG tablet Take 1 tablet (20 mg total) by mouth daily. 06/15/16  Yes Biagio Borg, MD  isosorbide mononitrate (IMDUR) 30 MG 24 hr tablet TAKE 0.5 TABLETS (15 MG TOTAL) BY MOUTH DAILY. 12/11/15  Yes Burnell Blanks, MD  lidocaine (LIDODERM) 5 % Place 1 patch onto the skin daily. Remove & Discard patch within 12 hours or as directed by MD 02/05/16  Yes Margette Fast, MD  lovastatin (MEVACOR) 20 MG tablet TAKE 1 TABLET BY MOUTH AT BEDTIME 03/10/16  Yes Biagio Borg, MD  metoprolol tartrate (LOPRESSOR) 25 MG tablet TAKE 1/2 TABLET BY MOUTH 2 TIMES A DAY 02/10/16  Yes Historical Provider, MD  nitroGLYCERIN (NITROSTAT) 0.4 MG SL tablet Place 1 tablet (0.4 mg total) under the tongue every 5 (five) minutes as needed for chest pain (up to 3 doses). 04/13/12  Yes Dayna N Dunn, PA-C  nortriptyline (PAMELOR) 10 MG capsule Take 20 mg by mouth at bedtime.  07/24/13  Yes Historical Provider, MD  ondansetron (ZOFRAN) 4 MG tablet Take 1 tablet (4 mg total) by mouth every 8 (eight) hours as needed for nausea or vomiting. Patient taking differently: Take 4 mg by mouth 2 (two) times daily.  01/06/16  Yes Courteney Lyn Mackuen, MD  polyethylene glycol (MIRALAX / GLYCOLAX) packet Take 17 g by mouth daily as needed for mild constipation.   Yes Historical Provider, MD  polyvinyl alcohol (LIQUIFILM TEARS) 1.4 % ophthalmic solution Place 1 drop into both eyes daily as needed.    Yes Historical Provider, MD  predniSONE (DELTASONE) 10 MG tablet Take 15 mg by mouth daily with breakfast.   Yes Historical Provider, MD  PROAIR HFA 108 (90 Base) MCG/ACT inhaler INHALE 2 PUFFS INTO THE LUNGS EVERY 6 (SIX) HOURS AS NEEDED FOR WHEEZING OR SHORTNESS OF BREATH. 07/20/16  Yes Biagio Borg, MD  tiotropium (SPIRIVA HANDIHALER) 18 MCG inhalation capsule Place 1 capsule (18 mcg  total) into inhaler and inhale daily at 6 (six) AM. 05/26/16  Yes Biagio Borg, MD  traMADol (ULTRAM) 50 MG tablet Take 100 mg by mouth 2 (two) times daily.    Yes Historical Provider, MD  vitamin B-12 (CYANOCOBALAMIN) 1000 MCG tablet Take 1,000 mcg by mouth daily.   Yes Historical Provider, MD     Family History  Problem Relation Age of Onset  . Osteoporosis Mother   . Heart disease Sister   . Emphysema Sister   . Seizures Sister     epilepsy  . Cardiomyopathy Sister   . Heart attack Sister     Social History   Social History  . Marital status: Married  Spouse name: N/A  . Number of children: 4  . Years of education: N/A   Occupational History  . disabled back since 2003    Social History Main Topics  . Smoking status: Former Smoker    Packs/day: 1.50    Years: 51.00    Types: Cigarettes    Quit date: 07/24/2005  . Smokeless tobacco: Never Used  . Alcohol use No  . Drug use: No  . Sexual activity: Not Asked   Other Topics Concern  . None   Social History Narrative  . None    Review of Systems: A 12 point ROS discussed and pertinent positives are indicated in the HPI above.  All other systems are negative.  Review of Systems  Constitutional: Positive for activity change, appetite change and fatigue. Negative for fever.  Respiratory: Negative for shortness of breath.   Cardiovascular: Negative for chest pain.  Gastrointestinal: Negative for abdominal pain.  Musculoskeletal: Positive for back pain and gait problem.  Neurological: Positive for weakness.  Psychiatric/Behavioral: Negative for behavioral problems and confusion.    Vital Signs: BP (!) 102/52 (BP Location: Left Arm)   Pulse 87   Temp 98.6 F (37 C) (Oral)   Resp 16   Ht 5\' 6"  (1.676 m)   Wt 150 lb (68 kg)   SpO2 100%   BMI 24.21 kg/m   Physical Exam  Constitutional: She is oriented to person, place, and time.  Cardiovascular: Normal rate, regular rhythm and normal heart sounds.     Pulmonary/Chest: Effort normal. She has wheezes.  Abdominal: Soft. Bowel sounds are normal. There is no tenderness.  Musculoskeletal: Normal range of motion.  Severe pain at area of T 11 vertebre  Neurological: She is alert and oriented to person, place, and time.  Skin: Skin is warm and dry.  Psychiatric: She has a normal mood and affect. Her behavior is normal. Judgment and thought content normal.  Nursing note and vitals reviewed.   Mallampati Score:  MD Evaluation Airway: WNL Heart: WNL Abdomen: WNL Chest/ Lungs: WNL ASA  Classification: 3 Mallampati/Airway Score: One  Imaging: Ct Angio Chest Pe W And/or Wo Contrast  Result Date: 09/10/2016 CLINICAL DATA:  Sternal chest pain, back pain, known compression fractures. EXAM: CT ANGIOGRAPHY CHEST WITH CONTRAST TECHNIQUE: Multidetector CT imaging of the chest was performed using the standard protocol during bolus administration of intravenous contrast. Multiplanar CT image reconstructions and MIPs were obtained to evaluate the vascular anatomy. CONTRAST:  100 cc Isovue 370 IV COMPARISON:  Thoracic spine MRI 09/07/2016, chest radiograph 06/30/2016. Chest CT 01/06/2016 FINDINGS: Cardiovascular: No aortic dissection or aneurysm, mild aortic atherosclerosis. The heart is normal in size. There are no filling defects within the pulmonary arteries to suggest pulmonary embolus. Coronary artery calcifications versus stents. Mediastinum/Nodes: No mediastinal or hilar adenopathy. Calcified right thyroid nodule is again seen. The esophagus is decompressed. Lungs/Pleura: Advanced emphysema. Mild central bronchial thickening. Previous 3 mm pleural-based nodule is grossly stable, image 48 series 6, less well-defined currently. No new pulmonary nodule. No consolidation. No pulmonary edema. No pneumothorax or pleural fluid. Upper Abdomen: Ingested material within the stomach. No acute abnormality. Musculoskeletal: T10 and T5 vertebral compression fractures  with augmentation. T11 compression fracture involving superior endplate, characterized on recent MRI. Sternum is intact. No acute osseous abnormality. Review of the MIP images confirms the above findings. IMPRESSION: 1. No pulmonary embolus. 2. Advanced emphysema.  Central bronchial thickening is chronic. 3. Mild aortic atherosclerosis.  Coronary artery calcifications. 4. Thoracic spine compression  fractures, T10 and T5 treated with vertebral augmentation. T11 compression fracture, as characterized on recent MRI. Electronically Signed   By: Jeb Levering M.D.   On: 09/10/2016 03:52   Mr Thoracic Spine W Wo Contrast  Result Date: 09/07/2016 CLINICAL DATA:  76 year old female with history of prior kyphoplasty. Mid back pain onset 10 days ago. Difficulty ambulating. Weakness. Subsequent encounter. EXAM: MRI THORACIC WITHOUT AND WITH CONTRAST TECHNIQUE: Multiplanar and multiecho pulse sequences of the thoracic spine were obtained without and with intravenous contrast. CONTRAST:  61mL MULTIHANCE GADOBENATE DIMEGLUMINE 529 MG/ML IV SOLN COMPARISON:  07/15/2016 kyphoplasty. 07/06/2016 in 12/2013 2017 MR. 01/06/2016 CT. FINDINGS: MRI THORACIC SPINE FINDINGS Alignment:  Mild thoracic kyphosis. Vertebrae: Remote T5 compression fracture treated with cement augmentation. T10 compression fracture recently treated with cement augmentation (07/15/2016). Interval further loss of height of the T10 vertebra (approximately 15-20%). No significant change nonspecific signal intensity within the right T9 facet/rib articulation possibly hemangioma as previously noted. Stress phenomena could cause a similar appearance. Malignancy less likely consideration. New T11 superior endplate compression fracture with subchondral edema, 25-30% loss of height and mild retropulsion posterosuperior aspect contributing to narrowing of the ventral thecal sac approaching but not compressing the cord. Cord:  No abnormal signal or enhancement.  Paraspinal and other soft tissues: No worrisome abnormality. Atherosclerotic changes thoracic aorta. Disc levels: C6-7: Cervical spondylotic changes suspected incompletely assessed. C7-T1:  Minimal anterior slips C7.  Minimal bulge. T1-2:  Negative. T2-3:  Minimal bulge. T3-4:  Negative. T4-5:  Minimal bulge right paracentral position. T5-6:  Bulge/spur with slight narrowing ventral thecal sac. T6-7:  Tiny left paracentral protrusion. T7-8:  Negative. T8-9: Bulge slightly greater to right. Slight narrowing ventral thecal sac. T9-10:  Minimal spur. T10-11: Retropulsed posterosuperior aspect T11 vertebral body/spur with narrowing ventral thecal sac greater on the right. T11-12:  Minimal bulge. T12-L1:  Minimal bulge.  Schmorl's node deformity. L1-2:  Small Schmorl's node deformity. L2-3:  Minimal Schmorl's node deformity. IMPRESSION: New T11 superior endplate compression fracture with subchondral edema, 25-30% loss of height and mild retropulsion posterosuperior aspect contributing to narrowing of the ventral thecal sac approaching but not compressing the cord. T10 compression fracture recently treated with cement augmentation (07/15/2016). Interval further loss of height of the T10 vertebra (approximately 15-20%). Remote T5 compression fracture treated with cement augmentation. No significant change nonspecific signal intensity within the right T9 facet/rib articulation possibly hemangioma as previously noted. Stress phenomena could cause a similar appearance. Malignancy less likely consideration. Electronically Signed   By: Genia Del M.D.   On: 09/07/2016 08:16    Labs:  CBC:  Recent Labs  07/15/16 1016 07/15/16 1122 09/09/16 2245 09/11/16 0312 09/13/16 0505  WBC 15.0*  --  19.1* 11.8* 8.3  HGB 13.8 13.3 13.2 12.8 10.6*  HCT 43.1 39.0 40.6 42.2 34.4*  PLT 288  --  233 272 227    COAGS:  Recent Labs  02/19/16 0823 07/15/16 1016  INR 0.89 0.85  APTT 22* 22*    BMP:  Recent Labs   06/30/16 1827 07/15/16 1122  09/09/16 2245 09/10/16 1538 09/11/16 0312 09/13/16 0505  NA 141 142  --  145  --  143 138  K 3.2* 3.4*  --  3.2*  --  4.3 4.6  CL 94* 96*  --  99*  --  97* 97*  CO2 36*  --   --  33*  --  36* 34*  GLUCOSE 218* 107*  --  130*  --  116* 124*  BUN 13 13  --  12  --  12 13  CALCIUM 9.0  --   --  9.1  --  9.1 8.7*  CREATININE 1.13* 1.10*  < > 1.10* 1.01* 1.10* 0.99  GFRNONAA 47*  --   --  48* 53* 48* 54*  GFRAA 54*  --   --  55* >60 55* >60  < > = values in this interval not displayed.  LIVER FUNCTION TESTS:  Recent Labs  01/06/16 1120 02/05/16 0856  BILITOT 0.4 0.4  AST 18 13  ALT 16 11  ALKPHOS 57 78  PROT 5.7* 6.1  ALBUMIN 3.4* 3.7    TUMOR MARKERS: No results for input(s): AFPTM, CEA, CA199, CHROMGRNA in the last 8760 hours.  Assessment and Plan:  Severe back pain Worsening to point of admission for pain control Known Thoracic 11 acute fracture Scheduled for T11 vertebroplasty/kyphoplasty procedure today or more likely 4/24 Pt and husband aware and agreeable Risks and Benefits discussed with the patient including, but not limited to education regarding the natural healing process of compression fractures without intervention, bleeding, infection, cement migration which may cause spinal cord damage, paralysis, pulmonary embolism or even death. All of the patient's questions were answered, patient is agreeable to proceed. Consent signed and in chart.  Thank you for this interesting consult.  I greatly enjoyed meeting Alexis Higgins and look forward to participating in their care.  A copy of this report was sent to the requesting provider on this date.  Electronically Signed: Bj Morlock A 09/13/2016, 9:15 AM   I spent a total of 40 Minutes    in face to face in clinical consultation, greater than 50% of which was counseling/coordinating care for T 11 VP/KP

## 2016-09-13 NOTE — Progress Notes (Signed)
Pt call for medication by husband at 0300 after using the bathroom PO dilaudid offered but pt refused and stated will wait for IV . Administering the medication and not pt was fast asleep when woke up to determine her pain rate Husband stated 10/10 and when RN said she wanted th pt herself to rate pain  Husband became irritated stating why  Keep asking for pt to rate pain level before being medicated. RN explained the reason.  Pt will be sound asleep but husband will call that pt need be medicated.  This continues through the shift.Marland Kitchen

## 2016-09-13 NOTE — Progress Notes (Signed)
RT came to give Spiriva MDI to pt. Pts family refused and told RT to come back. RT had notified pharmacy for the Spiriva and RT was notified by RN the Spiriva was filled. Pts family member was informed of having to notify pharmacy due to missing medication. At this time, family member refused and told RT to come back. RT notified RN. RT will continue to follow

## 2016-09-14 ENCOUNTER — Encounter (HOSPITAL_COMMUNITY): Payer: Self-pay

## 2016-09-14 ENCOUNTER — Ambulatory Visit (HOSPITAL_COMMUNITY): Admission: RE | Admit: 2016-09-14 | Payer: Medicare Other | Source: Ambulatory Visit

## 2016-09-14 ENCOUNTER — Inpatient Hospital Stay (HOSPITAL_COMMUNITY): Payer: Medicare Other

## 2016-09-14 ENCOUNTER — Encounter (HOSPITAL_COMMUNITY): Payer: Self-pay | Admitting: Interventional Radiology

## 2016-09-14 HISTORY — PX: IR KYPHO EA ADDL LEVEL THORACIC OR LUMBAR: IMG5520

## 2016-09-14 LAB — PROTIME-INR
INR: 0.92
Prothrombin Time: 12.3 seconds (ref 11.4–15.2)

## 2016-09-14 LAB — SURGICAL PCR SCREEN
MRSA, PCR: NEGATIVE
Staphylococcus aureus: NEGATIVE

## 2016-09-14 MED ORDER — FENTANYL CITRATE (PF) 100 MCG/2ML IJ SOLN
INTRAMUSCULAR | Status: AC
  Filled 2016-09-14: qty 2

## 2016-09-14 MED ORDER — HYDROMORPHONE HCL 1 MG/ML IJ SOLN
INTRAMUSCULAR | Status: AC | PRN
  Administered 2016-09-14: 1 mg via INTRAVENOUS

## 2016-09-14 MED ORDER — MIDAZOLAM HCL 2 MG/2ML IJ SOLN
INTRAMUSCULAR | Status: AC
  Filled 2016-09-14: qty 2

## 2016-09-14 MED ORDER — IOPAMIDOL (ISOVUE-300) INJECTION 61%
INTRAVENOUS | Status: AC
  Administered 2016-09-14: 10 mL
  Filled 2016-09-14: qty 50

## 2016-09-14 MED ORDER — CHLORHEXIDINE GLUCONATE CLOTH 2 % EX PADS
6.0000 | MEDICATED_PAD | Freq: Once | CUTANEOUS | Status: AC
  Administered 2016-09-14: 6 via TOPICAL

## 2016-09-14 MED ORDER — HEPARIN SODIUM (PORCINE) 1000 UNIT/ML IJ SOLN
5000.0000 [IU] | Freq: Three times a day (TID) | INTRAMUSCULAR | Status: DC
  Filled 2016-09-14 (×2): qty 5

## 2016-09-14 MED ORDER — BUPIVACAINE HCL (PF) 0.25 % IJ SOLN
INTRAMUSCULAR | Status: AC
  Filled 2016-09-14: qty 30

## 2016-09-14 MED ORDER — CEFAZOLIN SODIUM-DEXTROSE 2-4 GM/100ML-% IV SOLN
2.0000 g | INTRAVENOUS | Status: AC
  Administered 2016-09-14: 2 g via INTRAVENOUS

## 2016-09-14 MED ORDER — FENTANYL CITRATE (PF) 100 MCG/2ML IJ SOLN
INTRAMUSCULAR | Status: AC | PRN
  Administered 2016-09-14: 100 ug via INTRAVENOUS

## 2016-09-14 MED ORDER — HYDROMORPHONE HCL 1 MG/ML IJ SOLN
INTRAMUSCULAR | Status: AC
  Filled 2016-09-14: qty 1

## 2016-09-14 MED ORDER — MIDAZOLAM HCL 2 MG/2ML IJ SOLN
INTRAMUSCULAR | Status: AC | PRN
  Administered 2016-09-14: 2 mg via INTRAVENOUS

## 2016-09-14 MED ORDER — CEFAZOLIN SODIUM-DEXTROSE 2-4 GM/100ML-% IV SOLN
INTRAVENOUS | Status: AC
  Filled 2016-09-14: qty 100

## 2016-09-14 MED ORDER — TOBRAMYCIN SULFATE 1.2 G IJ SOLR
INTRAMUSCULAR | Status: AC
  Filled 2016-09-14: qty 1.2

## 2016-09-14 MED ORDER — HYDROMORPHONE HCL 2 MG PO TABS: 1.0000 mg | ORAL_TABLET | ORAL | 0 refills | Status: DC | PRN

## 2016-09-14 MED ORDER — BUPIVACAINE HCL (PF) 0.25 % IJ SOLN
INTRAMUSCULAR | Status: AC | PRN
  Administered 2016-09-14: 10 mL

## 2016-09-14 NOTE — Sedation Documentation (Signed)
Patient denies pain and is resting comfortably.  

## 2016-09-14 NOTE — Procedures (Signed)
Interventional Radiology Procedure Note  Procedure: single right extra-pedicle approach to T11 for KP for life-style limiting 10/10 pain.  Complications: None Recommendations:  - 20 minutes in room time - 4 hours of supine bed rest in recovery - Advance diet as tolerated - DC home in 4 hours when goals met - Do not submerge site for 7 days. - No heavy activity for 2 weeks of recovery time - Routine wound care   Signed,  Dulcy Fanny. Earleen Newport, DO

## 2016-09-14 NOTE — Progress Notes (Signed)
PROGRESS NOTE  SIDNEE GAMBRILL NWG:[id number] DOB: [birth year] DOA: 09/10/2016 PCP: Cathlean Cower, MD   LOS: 4 days   Brief Narrative: 76 year old with COPD steroid dependent, osteoporosis, recurrent thoracic vertebrae fractures, recently diagnosed with a T11 compression fracture is days ago, who presented to the emergency room twice due to excruciating pain unable to be controlled by oral pain medications at home. She was maintained on combination of IV and oral medications with good control of her pain and IR did kyphoplasty 4/24. Plan for home d/c 4/25  Assessment & Plan: Principal Problem:   Intractable back pain Active Problems:   Hyperlipidemia   Essential hypertension   COPD (chronic obstructive pulmonary disease) with emphysema (HCC)   Chronic respiratory failure (HCC)   Chest pain   Intractable back pain due to T11 fracture -She actually reports pain radiating from the back to flanks and it is actually mostly in the front of her chest.  This is similar to her fracture pains that she has had in the past.  -patient was drowsy over the weekend on IV pain medication, decreased frequency and increased po pills availability, no further issues since -kyphoplasty today she is currently on bed rest until 5 pm, mobilize with PT in am and d/c home if stable  COPD with chronic hypoxic respiratory failure and steroid dependence -She uses oxygen at home, continue -She has no wheezing on exam, continue home medications -Continue steroids  Hypertension -Continue her home metoprolol, Lasix  Hyperlipidemia -Continue home pravastatin  Coronary artery disease -Current chest pain is likely due to the fracture as it is very atypical to be cardiac in origin.  Is also dependent on movement.  Troponins remained flat at 0.04 and not in a pattern consistent with ACS.   DVT prophylaxis: heparin Code Status: Full code Family Communication: Discussed with husband at bedside Disposition Plan: Home  after kyphoplasty   Consultants:   IR  Procedures:   IR kyphoplasty 4/24  Antimicrobials:  none   Subjective: -no complaints awaiting procedure  Objective: Vitals:   09/14/16 1315 09/14/16 1321 09/14/16 1326 09/14/16 1400  BP: 127/82 127/82 130/83 (!) 141/62  Pulse: 86 87 88 87  Resp: 10 10 10 16   Temp:    98 F (36.7 C)  TempSrc:    Oral  SpO2: 100% 100% 100% 100%  Weight:      Height:       No intake or output data in the 24 hours ending 09/14/16 1558 Filed Weights   09/10/16 1244  Weight: 68 kg (150 lb)    Examination: Constitutional: NAD Vitals:   09/14/16 1315 09/14/16 1321 09/14/16 1326 09/14/16 1400  BP: 127/82 127/82 130/83 (!) 141/62  Pulse: 86 87 88 87  Resp: 10 10 10 16   Temp:    98 F (36.7 C)  TempSrc:    Oral  SpO2: 100% 100% 100% 100%  Weight:      Height:       Respiratory: distant breath sounds, no wheezing  Cardiovascular: Regular rate and rhythm, no murmurs / rubs / gallops. 1+ LE edema Abdomen: no tenderness. Bowel sounds positive.    Data Reviewed: I have personally reviewed following labs and imaging studies  CBC:  Recent Labs Lab 09/09/16 2245 09/11/16 0312 09/13/16 0505  WBC 19.1* 11.8* 8.3  NEUTROABS 15.9*  --   --   HGB 13.2 12.8 10.6*  HCT 40.6 42.2 34.4*  MCV 90.6 94.6 91.5  PLT 233 272 578   Basic Metabolic  Panel:  Recent Labs Lab 09/09/16 2245 09/10/16 1538 09/11/16 0312 09/13/16 0505  NA 145  --  143 138  K 3.2*  --  4.3 4.6  CL 99*  --  97* 97*  CO2 33*  --  36* 34*  GLUCOSE 130*  --  116* 124*  BUN 12  --  12 13  CREATININE 1.10* 1.01* 1.10* 0.99  CALCIUM 9.1  --  9.1 8.7*   GFR: Estimated Creatinine Clearance: 46 mL/min (by C-G formula based on SCr of 0.99 mg/dL). Liver Function Tests: No results for input(s): AST, ALT, ALKPHOS, BILITOT, PROT, ALBUMIN in the last 168 hours. No results for input(s): LIPASE, AMYLASE in the last 168 hours. No results for input(s): AMMONIA in the last 168  hours. Coagulation Profile:  Recent Labs Lab 09/14/16 0850  INR 0.92   Cardiac Enzymes:  Recent Labs Lab 09/10/16 1538 09/10/16 2119 09/11/16 0312  TROPONINI 0.04* 0.04* 0.04*   BNP (last 3 results) No results for input(s): PROBNP in the last 8760 hours. HbA1C: No results for input(s): HGBA1C in the last 72 hours. CBG: No results for input(s): GLUCAP in the last 168 hours. Lipid Profile: No results for input(s): CHOL, HDL, LDLCALC, TRIG, CHOLHDL, LDLDIRECT in the last 72 hours. Thyroid Function Tests: No results for input(s): TSH, T4TOTAL, FREET4, T3FREE, THYROIDAB in the last 72 hours. Anemia Panel: No results for input(s): VITAMINB12, FOLATE, FERRITIN, TIBC, IRON, RETICCTPCT in the last 72 hours. Urine analysis:    Component Value Date/Time   COLORURINE YELLOW 08/01/2015 1050   APPEARANCEUR CLEAR 08/01/2015 1050   LABSPEC <=1.005 (A) 08/01/2015 1050   PHURINE 6.0 08/01/2015 1050   GLUCOSEU NEGATIVE 08/01/2015 1050   HGBUR NEGATIVE 08/01/2015 1050   BILIRUBINUR NEGATIVE 08/01/2015 1050   KETONESUR NEGATIVE 08/01/2015 1050   PROTEINUR NEGATIVE 11/22/2013 2000   UROBILINOGEN 0.2 08/01/2015 1050   NITRITE NEGATIVE 08/01/2015 1050   LEUKOCYTESUR NEGATIVE 08/01/2015 1050   Sepsis Labs: Invalid input(s): PROCALCITONIN, LACTICIDVEN  Recent Results (from the past 240 hour(s))  Surgical PCR screen     Status: None   Collection Time: 09/13/16 11:29 PM  Result Value Ref Range Status   MRSA, PCR NEGATIVE NEGATIVE Final   Staphylococcus aureus NEGATIVE NEGATIVE Final    Comment:        The Xpert SA Assay (FDA approved for NASAL specimens in patients over 1 years of age), is one component of a comprehensive surveillance program.  Test performance has been validated by [facility] for patients greater than or equal to 74 year old. It is not intended to diagnose infection nor to guide or monitor treatment.       Radiology Studies: Ir Kypho Ea Addl Level  Thoracic Or Lumbar  Result Date: 09/14/2016 INDICATION: 76 year old female with new T11 compression fracture and lifestyle limiting 10 out of 10 pain. EXAM: IR KYPHO VERTEBRAL ADDL AGMENTATION COMPARISON:  MRI 09/07/2016, CT 09/10/2016 MEDICATIONS: As antibiotic prophylaxis, 2.0 g Ancef was ordered pre-procedure and administered intravenously within 1 hour of incision. ANESTHESIA/SEDATION: Moderate (conscious) sedation was employed during this procedure. A total of Versed 2.0 mg and Fentanyl 100 mcg, 1 mg Dilaudid was administered intravenously. Moderate Sedation Time: 30 minutes. The patient's level of consciousness and vital signs were monitored continuously by radiology nursing throughout the procedure under my direct supervision. FLUOROSCOPY TIME:  Fluoroscopy Time: 5 minutes 12 seconds (59 mGy) COMPLICATIONS: None PROCEDURE: Following a full explanation of the procedure along with the potentially associated complications, a witnessed informed  consent was obtained. Specific risks that were discussed included bleeding, infection, injury to adjacent structures, neurologic injury, embolization of cement within the veins, failure of the procedure to improve pain, need for further procedure/ surgery, cardiopulmonary collapse, death. The patient understands the risks and wishes to proceed. The patient was placed prone on the fluoroscopic table. Nasal oxygen was administered. Physiologic monitoring was performed throughout the duration of the procedure. The skin overlying the T11 region was prepped and draped in the usual sterile fashion. The T11 vertebral body was identified and 1% lidocaine was used to anesthetize the skin overlying the right pedicle to the level of the bone. A small incision was made with 11 blade scalpel, and then a Medtronic 8 gauge needle was advanced to the bone adjacent to the right pedicle via extra pedicular approach into the anterior 1/3 of the vertebral body. Inflation of the right  pedicular cannula balloon was performed under fluoroscopic observation, with central balloon location within the vertebral body. Methylmethacrylate mixture was then reconstituted. Under biplane intermittent fluoroscopy, the methylmethacrylate was then hand injected into the cavity at T11 vertebral body with filling of the fracture cleft. No extravasation was noted posteriorly into the spinal canal. No epidural venous contamination was seen. The cannula was then removed. Hemostasis was achieved at the skin entry site. There were no acute complications. Patient tolerated the procedure well. The patient was observed for 30 minutes and returned to her room in good condition. IMPRESSION: Status post right sided extra pedicular approach for kyphoplasty treatment of new T11 compression fracture. Signed, Dulcy Fanny. Earleen Newport, DO [facility] Radiology Electronically Signed   By: Corrie Mckusick D.O.   On: 09/14/2016 13:32     Scheduled Meds: . bupivacaine (PF)      . cholecalciferol  1,000 Units Oral QPM  . docusate sodium  100 mg Oral Daily  . fentaNYL      . furosemide  20 mg Oral Daily  . HYDROmorphone      . isosorbide mononitrate  30 mg Oral Daily  . metoprolol tartrate  25 mg Oral BID  . midazolam      . nortriptyline  20 mg Oral QHS  . polyethylene glycol  17 g Oral Daily  . pravastatin  20 mg Oral q1800  . predniSONE  15 mg Oral Q breakfast  . senna-docusate  2 tablet Oral BID  . sodium chloride flush  3 mL Intravenous Q12H  . tiotropium  18 mcg Inhalation Daily  . tobramycin       Continuous Infusions: . ceFAZolin        Marzetta Board, MD, PhD [facility] Pager [phone]  If 7PM-7AM, please contact night-coverage [email or web address] Password [facility] 09/14/2016, 3:58 PM

## 2016-09-14 NOTE — Sedation Documentation (Signed)
Pt. Waiting in prone position for 20 minutes after procedure, per MD.

## 2016-09-15 ENCOUNTER — Telehealth: Payer: Self-pay | Admitting: *Deleted

## 2016-09-15 DIAGNOSIS — M549 Dorsalgia, unspecified: Secondary | ICD-10-CM

## 2016-09-15 DIAGNOSIS — S22000A Wedge compression fracture of unspecified thoracic vertebra, initial encounter for closed fracture: Secondary | ICD-10-CM

## 2016-09-15 DIAGNOSIS — J439 Emphysema, unspecified: Secondary | ICD-10-CM

## 2016-09-15 MED ORDER — HEPARIN SODIUM (PORCINE) 5000 UNIT/ML IJ SOLN
5000.0000 [IU] | Freq: Three times a day (TID) | INTRAMUSCULAR | Status: DC
  Administered 2016-09-15: 5000 [IU] via SUBCUTANEOUS
  Filled 2016-09-15: qty 1

## 2016-09-15 NOTE — Progress Notes (Signed)
Patient ID: Alexis Higgins, female   DOB: [birth year], 76 y.o.   MRN: [id number]    Referring Physician(s): Dr. Marzetta Board  Supervising Physician: Sandi Mariscal  Patient Status: [facility] - In-pt  Chief Complaint: T11 compression fracture  Subjective: Patient feels better today and states that her pain is improved.  She denies soreness.  Allergies: Aspirin; Codeine; and Other  Medications: Prior to Admission medications   Medication Sig Start Date End Date Taking? Authorizing Provider  ascorbic acid (VITAMIN C) 1000 MG tablet Take 1,000 mg by mouth daily.   Yes Historical Provider, MD  Cholecalciferol (VITAMIN D) 1000 UNITS capsule Take 1,000 Units by mouth every evening.    Yes Historical Provider, MD  clopidogrel (PLAVIX) 75 MG tablet TAKE 1 TABLET (75 MG TOTAL) BY MOUTH DAILY. 12/11/15  Yes Burnell Blanks, MD  diclofenac sodium (VOLTAREN) 1 % GEL Apply 4 g topically 4 (four) times daily. 09/10/16  Yes April Palumbo, MD  docusate sodium (COLACE) 100 MG capsule Take 100 mg by mouth daily as needed. For constipation   Yes Historical Provider, MD  furosemide (LASIX) 20 MG tablet Take 1 tablet (20 mg total) by mouth daily. 06/15/16  Yes Biagio Borg, MD  isosorbide mononitrate (IMDUR) 30 MG 24 hr tablet TAKE 0.5 TABLETS (15 MG TOTAL) BY MOUTH DAILY. 12/11/15  Yes Burnell Blanks, MD  lidocaine (LIDODERM) 5 % Place 1 patch onto the skin daily. Remove & Discard patch within 12 hours or as directed by MD 02/05/16  Yes Margette Fast, MD  lovastatin (MEVACOR) 20 MG tablet TAKE 1 TABLET BY MOUTH AT BEDTIME 03/10/16  Yes Biagio Borg, MD  metoprolol tartrate (LOPRESSOR) 25 MG tablet TAKE 1/2 TABLET BY MOUTH 2 TIMES A DAY 02/10/16  Yes Historical Provider, MD  nitroGLYCERIN (NITROSTAT) 0.4 MG SL tablet Place 1 tablet (0.4 mg total) under the tongue every 5 (five) minutes as needed for chest pain (up to 3 doses). 04/13/12  Yes Dayna N Dunn, PA-C  nortriptyline (PAMELOR) 10 MG capsule Take  20 mg by mouth at bedtime.  07/24/13  Yes Historical Provider, MD  ondansetron (ZOFRAN) 4 MG tablet Take 1 tablet (4 mg total) by mouth every 8 (eight) hours as needed for nausea or vomiting. Patient taking differently: Take 4 mg by mouth 2 (two) times daily.  01/06/16  Yes Courteney Lyn Mackuen, MD  polyethylene glycol (MIRALAX / GLYCOLAX) packet Take 17 g by mouth daily as needed for mild constipation.   Yes Historical Provider, MD  polyvinyl alcohol (LIQUIFILM TEARS) 1.4 % ophthalmic solution Place 1 drop into both eyes daily as needed.    Yes Historical Provider, MD  predniSONE (DELTASONE) 10 MG tablet Take 15 mg by mouth daily with breakfast.   Yes Historical Provider, MD  PROAIR HFA 108 (90 Base) MCG/ACT inhaler INHALE 2 PUFFS INTO THE LUNGS EVERY 6 (SIX) HOURS AS NEEDED FOR WHEEZING OR SHORTNESS OF BREATH. 07/20/16  Yes Biagio Borg, MD  tiotropium (SPIRIVA HANDIHALER) 18 MCG inhalation capsule Place 1 capsule (18 mcg total) into inhaler and inhale daily at 6 (six) AM. 05/26/16  Yes Biagio Borg, MD  traMADol (ULTRAM) 50 MG tablet Take 100 mg by mouth 2 (two) times daily.    Yes Historical Provider, MD  vitamin B-12 (CYANOCOBALAMIN) 1000 MCG tablet Take 1,000 mcg by mouth daily.   Yes Historical Provider, MD  HYDROmorphone (DILAUDID) 2 MG tablet Take 0.5 tablets (1 mg total) by mouth every 2 (two) hours  as needed for severe pain. 09/14/16   Costin Karlyne Greenspan, MD    Vital Signs: BP (!) 130/49 (BP Location: Left Arm)   Pulse 95   Temp 98 F (36.7 C) (Oral)   Resp 20   Ht 5\' 6"  (1.676 m)   Wt 150 lb (68 kg)   SpO2 100%   BMI 24.21 kg/m   Physical Exam: Back: stick site in back is c/d/I with gauze present and no drainage noted.  She is nontender over this site.  Imaging: Ir Kypho Ea Addl Level Thoracic Or Lumbar  Result Date: 09/14/2016 INDICATION: 76 year old female with new T11 compression fracture and lifestyle limiting 10 out of 10 pain. EXAM: IR KYPHO VERTEBRAL ADDL AGMENTATION  COMPARISON:  MRI 09/07/2016, CT 09/10/2016 MEDICATIONS: As antibiotic prophylaxis, 2.0 g Ancef was ordered pre-procedure and administered intravenously within 1 hour of incision. ANESTHESIA/SEDATION: Moderate (conscious) sedation was employed during this procedure. A total of Versed 2.0 mg and Fentanyl 100 mcg, 1 mg Dilaudid was administered intravenously. Moderate Sedation Time: 30 minutes. The patient's level of consciousness and vital signs were monitored continuously by radiology nursing throughout the procedure under my direct supervision. FLUOROSCOPY TIME:  Fluoroscopy Time: 5 minutes 12 seconds (59 mGy) COMPLICATIONS: None PROCEDURE: Following a full explanation of the procedure along with the potentially associated complications, a witnessed informed consent was obtained. Specific risks that were discussed included bleeding, infection, injury to adjacent structures, neurologic injury, embolization of cement within the veins, failure of the procedure to improve pain, need for further procedure/ surgery, cardiopulmonary collapse, death. The patient understands the risks and wishes to proceed. The patient was placed prone on the fluoroscopic table. Nasal oxygen was administered. Physiologic monitoring was performed throughout the duration of the procedure. The skin overlying the T11 region was prepped and draped in the usual sterile fashion. The T11 vertebral body was identified and 1% lidocaine was used to anesthetize the skin overlying the right pedicle to the level of the bone. A small incision was made with 11 blade scalpel, and then a Medtronic 8 gauge needle was advanced to the bone adjacent to the right pedicle via extra pedicular approach into the anterior 1/3 of the vertebral body. Inflation of the right pedicular cannula balloon was performed under fluoroscopic observation, with central balloon location within the vertebral body. Methylmethacrylate mixture was then reconstituted. Under biplane  intermittent fluoroscopy, the methylmethacrylate was then hand injected into the cavity at T11 vertebral body with filling of the fracture cleft. No extravasation was noted posteriorly into the spinal canal. No epidural venous contamination was seen. The cannula was then removed. Hemostasis was achieved at the skin entry site. There were no acute complications. Patient tolerated the procedure well. The patient was observed for 30 minutes and returned to her room in good condition. IMPRESSION: Status post right sided extra pedicular approach for kyphoplasty treatment of new T11 compression fracture. Signed, Alexis Fanny. Earleen Newport, DO [facility] Radiology Electronically Signed   By: Corrie Mckusick D.O.   On: 09/14/2016 13:32    Labs:  CBC:  Recent Labs  07/15/16 1016 07/15/16 1122 09/09/16 2245 09/11/16 0312 09/13/16 0505  WBC 15.0*  --  19.1* 11.8* 8.3  HGB 13.8 13.3 13.2 12.8 10.6*  HCT 43.1 39.0 40.6 42.2 34.4*  PLT 288  --  233 272 227    COAGS:  Recent Labs  02/19/16 0823 07/15/16 1016 09/14/16 0850  INR 0.89 0.85 0.92  APTT 22* 22*  --  BMP:  Recent Labs  06/30/16 1827 07/15/16 1122  09/09/16 2245 09/10/16 1538 09/11/16 0312 09/13/16 0505  NA 141 142  --  145  --  143 138  K 3.2* 3.4*  --  3.2*  --  4.3 4.6  CL 94* 96*  --  99*  --  97* 97*  CO2 36*  --   --  33*  --  36* 34*  GLUCOSE 218* 107*  --  130*  --  116* 124*  BUN 13 13  --  12  --  12 13  CALCIUM 9.0  --   --  9.1  --  9.1 8.7*  CREATININE 1.13* 1.10*  < > 1.10* 1.01* 1.10* 0.99  GFRNONAA 47*  --   --  48* 53* 48* 54*  GFRAA 54*  --   --  55* >60 55* >60  < > = values in this interval not displayed.  LIVER FUNCTION TESTS:  Recent Labs  01/06/16 1120 02/05/16 0856  BILITOT 0.4 0.4  AST 18 13  ALT 16 11  ALKPHOS 57 78  PROT 5.7* 6.1  ALBUMIN 3.4* 3.7    Assessment and Plan: 1. T11 compression fracture, s/p KP Patient is improving and her  pain is much better.   She is being discharged today I discussed her restrictions with her and informed her that we would contact her to follow up with Dr. Earleen Higgins in 2-4 weeks.  Electronically Signed: Henreitta Cea 09/15/2016, 12:06 PM   I spent a total of 15 Minutes at the the patient's bedside AND on the patient's hospital floor or unit, greater than 50% of which was counseling/coordinating care for T11 compression fracture

## 2016-09-15 NOTE — Discharge Summary (Signed)
Physician Discharge Summary  Alexis Higgins GXQ:[id number] DOB: 02/18/1941 DOA: 09/10/2016  PCP: Cathlean Cower, MD  Admit date: 09/10/2016 Discharge date: 09/15/2016  Admitted From:home Disposition:home with home care  Recommendations for Outpatient Follow-up:  1. Follow up with PCP in 1-2 weeks 2. Please obtain BMP/CBC in one week  Home Health:yes Equipment/Devices:no Discharge Condition:stabe CODE STATUS:partial DNR Diet recommendation:heart healthy.  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 Patient was treated with IV and oral pain medications and evaluated by interventional radiologist. Patient underwent kyphoplasty on 4/24.  Patient reported that her pain is much better today. Evaluated by PT OT and plan to discharge home with home care services including therapies. I discussed with the case manager. Patient reported that her pain is much better and able to ambulate. She remained clinically stable for her COPD, hypertension and dyslipidemia. Resume home medications on discharge. I recommended patient to follow-up with her PCP in a week.  Discharge Diagnoses:  Principal Problem:   Intractable back pain Active Problems:   Hyperlipidemia   Essential hypertension   COPD (chronic obstructive pulmonary disease) with emphysema (HCC)   Chronic respiratory failure (HCC)   Chest pain    Discharge Instructions  Discharge Instructions    Call MD for:  difficulty breathing, headache or visual disturbances    Complete by:  As directed    Call MD for:  extreme fatigue    Complete by:  As directed    Call MD for:  hives    Complete by:  As directed    Call MD for:  persistant dizziness or light-headedness    Complete  by:  As directed    Call MD for:  persistant nausea and vomiting    Complete by:  As directed    Call MD for:  severe uncontrolled pain    Complete by:  As directed    Call MD for:  temperature >100.4    Complete by:  As directed    Diet - low sodium heart healthy    Complete by:  As directed    Increase activity slowly    Complete by:  As directed      Allergies as of 09/15/2016      Reactions   Aspirin Other (See Comments)    cns bleed risk   Codeine Anaphylaxis, Rash   Other Other (See Comments)   Stiolto - severe reaction - caused inability to breath      Medication List    TAKE these medications   ascorbic acid 1000 MG tablet Commonly known as:  VITAMIN C Take 1,000 mg by mouth daily.   clopidogrel 75 MG tablet Commonly known as:  PLAVIX TAKE 1 TABLET (75 MG TOTAL) BY MOUTH DAILY.   diclofenac sodium 1 % Gel Commonly known as:  VOLTAREN Apply 4 g topically 4 (four) times daily.   docusate sodium 100 MG capsule Commonly known as:  COLACE Take 100 mg by mouth daily as needed. For constipation   furosemide 20 MG tablet Commonly known as:  LASIX Take 1 tablet (20 mg total) by mouth daily.   HYDROmorphone 2 MG tablet Commonly known as:  DILAUDID Take 0.5 tablets (1 mg total) by mouth every 2 (two) hours as needed for severe pain.   isosorbide mononitrate 30 MG 24 hr  tablet Commonly known as:  IMDUR TAKE 0.5 TABLETS (15 MG TOTAL) BY MOUTH DAILY.   lidocaine 5 % Commonly known as:  LIDODERM Place 1 patch onto the skin daily. Remove & Discard patch within 12 hours or as directed by MD   lovastatin 20 MG tablet Commonly known as:  MEVACOR TAKE 1 TABLET BY MOUTH AT BEDTIME   metoprolol tartrate 25 MG tablet Commonly known as:  LOPRESSOR TAKE 1/2 TABLET BY MOUTH 2 TIMES A DAY   nitroGLYCERIN 0.4 MG SL tablet Commonly known as:  NITROSTAT Place 1 tablet (0.4 mg total) under the tongue every 5 (five) minutes as needed for chest pain (up to 3 doses).    nortriptyline 10 MG capsule Commonly known as:  PAMELOR Take 20 mg by mouth at bedtime.   ondansetron 4 MG tablet Commonly known as:  ZOFRAN Take 1 tablet (4 mg total) by mouth every 8 (eight) hours as needed for nausea or vomiting. What changed:  when to take this   polyethylene glycol packet Commonly known as:  MIRALAX / GLYCOLAX Take 17 g by mouth daily as needed for mild constipation.   polyvinyl alcohol 1.4 % ophthalmic solution Commonly known as:  LIQUIFILM TEARS Place 1 drop into both eyes daily as needed.   predniSONE 10 MG tablet Commonly known as:  DELTASONE Take 15 mg by mouth daily with breakfast.   PROAIR HFA 108 (90 Base) MCG/ACT inhaler Generic drug:  albuterol INHALE 2 PUFFS INTO THE LUNGS EVERY 6 (SIX) HOURS AS NEEDED FOR WHEEZING OR SHORTNESS OF BREATH.   tiotropium 18 MCG inhalation capsule Commonly known as:  SPIRIVA HANDIHALER Place 1 capsule (18 mcg total) into inhaler and inhale daily at 6 (six) AM.   traMADol 50 MG tablet Commonly known as:  ULTRAM Take 100 mg by mouth 2 (two) times daily.   vitamin B-12 1000 MCG tablet Commonly known as:  CYANOCOBALAMIN Take 1,000 mcg by mouth daily.   Vitamin D 1000 units capsule Take 1,000 Units by mouth every evening.      Follow-up Information    [facility] Follow up.   Specialty:  Home Health Services Why:  They will contact you for the first visit. Contact information: [address] [place] [address] [phone]        Cathlean Cower, MD. Schedule an appointment as soon as possible for a visit in 1 week(s).   Specialties:  Internal Medicine, Radiology Contact information: [address] [place] [address] [phone]          Allergies  Allergen Reactions  . Aspirin Other (See Comments)     cns bleed risk  . Codeine Anaphylaxis and Rash  . Other Other (See Comments)    Stiolto - severe reaction - caused inability to breath     Consultations: IR  Procedures/Studies: Kyphoplasty  Subjective: Patient was seen and examined at bedside. Patient reported doing much better today. Denied headache, dizziness, nausea, vomiting, chest pain or shortness of breath. Back pain is much better. Husband at bedside. Reported that she was able to ambulate. Eager to go home today.  Discharge Exam: Vitals:   09/15/16 0605 09/15/16 0951  BP: (!) 143/72 (!) 130/49  Pulse: 93 95  Resp: 20 20  Temp: 98 F (36.7 C) 98 F (36.7 C)   Vitals:   09/15/16 0100 09/15/16 0605 09/15/16 0804 09/15/16 0951  BP: 113/69 (!) 143/72  (!) 130/49  Pulse: 76 93  95  Resp: 20 20  20   Temp: 98.1 F (36.7 C) 98 F (36.7 C)  98 F (36.7 C)  TempSrc: Oral Oral  Oral  SpO2: 100% 100% 99% 100%  Weight:      Height:        General: Pt is alert, awake, not in acute distress Cardiovascular: RRR, S1/S2 +, no rubs, no gallops Respiratory: CTA bilaterally, no wheezing, no rhonchi Abdominal: Soft, NT, ND, bowel sounds + Extremities: no edema, no cyanosis    The results of significant diagnostics from this hospitalization (including imaging, microbiology, ancillary and laboratory) are listed below for reference.     Microbiology: Recent Results (from the past 240 hour(s))  Surgical PCR screen     Status: None   Collection Time: 09/13/16 11:29 PM  Result Value Ref Range Status   MRSA, PCR NEGATIVE NEGATIVE Final   Staphylococcus aureus NEGATIVE NEGATIVE Final    Comment:        The Xpert SA Assay (FDA approved for NASAL specimens in patients over 8 years of age), is one component of a comprehensive surveillance program.  Test performance has been validated by [facility] for patients greater than or equal to 34 year old. It is not intended to diagnose infection nor to guide or monitor treatment.      Labs: BNP (last 3 results) No results for input(s): BNP in the last 8760 hours. Basic Metabolic Panel:  Recent Labs Lab  09/09/16 2245 09/10/16 1538 09/11/16 0312 09/13/16 0505  NA 145  --  143 138  K 3.2*  --  4.3 4.6  CL 99*  --  97* 97*  CO2 33*  --  36* 34*  GLUCOSE 130*  --  116* 124*  BUN 12  --  12 13  CREATININE 1.10* 1.01* 1.10* 0.99  CALCIUM 9.1  --  9.1 8.7*   Liver Function Tests: No results for input(s): AST, ALT, ALKPHOS, BILITOT, PROT, ALBUMIN in the last 168 hours. No results for input(s): LIPASE, AMYLASE in the last 168 hours. No results for input(s): AMMONIA in the last 168 hours. CBC:  Recent Labs Lab 09/09/16 2245 09/11/16 0312 09/13/16 0505  WBC 19.1* 11.8* 8.3  NEUTROABS 15.9*  --   --   HGB 13.2 12.8 10.6*  HCT 40.6 42.2 34.4*  MCV 90.6 94.6 91.5  PLT 233 272 227   Cardiac Enzymes:  Recent Labs Lab 09/10/16 1538 09/10/16 2119 09/11/16 0312  TROPONINI 0.04* 0.04* 0.04*   BNP: Invalid input(s): POCBNP CBG: No results for input(s): GLUCAP in the last 168 hours. D-Dimer No results for input(s): DDIMER in the last 72 hours. Hgb A1c No results for input(s): HGBA1C in the last 72 hours. Lipid Profile No results for input(s): CHOL, HDL, LDLCALC, TRIG, CHOLHDL, LDLDIRECT in the last 72 hours. Thyroid function studies No results for input(s): TSH, T4TOTAL, T3FREE, THYROIDAB in the last 72 hours.  Invalid input(s): FREET3 Anemia work up No results for input(s): VITAMINB12, FOLATE, FERRITIN, TIBC, IRON, RETICCTPCT in the last 72 hours. Urinalysis    Component Value Date/Time   COLORURINE YELLOW 08/01/2015 1050   APPEARANCEUR CLEAR 08/01/2015 1050   LABSPEC <=1.005 (A) 08/01/2015 1050   PHURINE 6.0 08/01/2015 1050   GLUCOSEU NEGATIVE 08/01/2015 1050   HGBUR NEGATIVE 08/01/2015 1050   BILIRUBINUR NEGATIVE 08/01/2015 1050   KETONESUR NEGATIVE 08/01/2015 1050   PROTEINUR NEGATIVE 11/22/2013 2000   UROBILINOGEN 0.2 08/01/2015 1050   NITRITE NEGATIVE 08/01/2015 1050   LEUKOCYTESUR NEGATIVE 08/01/2015 1050  Sepsis Labs Invalid input(s): PROCALCITONIN,  WBC,   LACTICIDVEN Microbiology Recent Results (from the past 240 hour(s))  Surgical PCR screen     Status: None   Collection Time: 09/13/16 11:29 PM  Result Value Ref Range Status   MRSA, PCR NEGATIVE NEGATIVE Final   Staphylococcus aureus NEGATIVE NEGATIVE Final    Comment:        The Xpert SA Assay (FDA approved for NASAL specimens in patients over 10 years of age), is one component of a comprehensive surveillance program.  Test performance has been validated by [facility] for patients greater than or equal to 82 year old. It is not intended to diagnose infection nor to guide or monitor treatment.      Time coordinating discharge: 28 minutes  SIGNED:   Rosita Fire, MD  [facility] 09/15/2016, 10:46 AM  If 7PM-7AM, please contact night-coverage [email or web address] Password [email or web address]

## 2016-09-15 NOTE — Discharge Instructions (Signed)
Balloon Kyphoplasty, Care After Refer to this sheet in the next few weeks. These instructions provide you with information about caring for yourself after your procedure. Your health care provider may also give you more specific instructions. Your treatment has been planned according to current medical practices, but problems sometimes occur. Call your health care provider if you have any problems or questions after your procedure. What can I expect after the procedure? After your procedure, it is common to have back pain. Follow these instructions at home: Incision care   Follow instructions from your health care provider about how to take care of your incisions. Make sure you:  Wash your hands with soap and water before you change your bandage (dressing). If soap and water are not available, use hand sanitizer.  Change your dressing as told by your health care provider.  Leave stitches (sutures), skin glue, or adhesive strips in place. These skin closures may need to be in place for 2 weeks or longer. If adhesive strip edges start to loosen and curl up, you may trim the loose edges. Do not remove adhesive strips completely unless your health care provider tells you to do that.  Check your incision area every day for signs of infection. Watch for:  Redness, swelling, or pain.  Fluid, blood, or pus.  Keep your dressing dry until your health care provider says that it can be removed. Activity    Rest your back and avoid intense physical activity for as long as told by your health care provider.  Return to your normal activities as told by your health care provider. Ask your health care provider what activities are safe for you.  Do not lift anything that is heavier than 10 lb (4.5 kg). This is about the weight of a gallon of milk.You may need to avoid heavy lifting for several weeks. General instructions   Take over-the-counter and prescription medicines only as told by your health  care provider.  If directed, apply ice to the painful area:  Put ice in a plastic bag.  Place a towel between your skin and the bag.  Leave the ice on for 20 minutes, 2-3 times per day.  Do not use tobacco products, including cigarettes, chewing tobacco, or e-cigarettes. If you need help quitting, ask your health care provider.  Keep all follow-up visits as told by your health care provider. This is important. Contact a health care provider if:  You have a fever.  You have redness, swelling, or pain at the site of your incisions.  You have fluid, blood, or pus coming from your incisions.  You have pain that gets worse or does not get better with medicine.  You develop numbness or weakness in any part of your body. Get help right away if:  You have chest pain.  You have difficulty breathing.  You cannot move your legs.  You cannot control your bladder or bowel movements.  You suddenly become weak or numb on one side of your body.  You become very confused.  You have trouble speaking or understanding, or both. This information is not intended to replace advice given to you by your health care provider. Make sure you discuss any questions you have with your health care provider. Document Released: 01/29/2015 Document Revised: 10/16/2015 Document Reviewed: 09/02/2014 Elsevier Interactive Patient Education  2017 [employer].

## 2016-09-15 NOTE — Telephone Encounter (Signed)
Called pt to set-up TCM hosp appt pt was in the bathroom husband stated they just got home, and will like to go over d/c summary first before making the appt. Inform pt I will give them call back in the morning..../lmb.

## 2016-09-15 NOTE — Progress Notes (Signed)
0.5 tab of dilaudid wasted in sharps container with Rocky Morel., RN as witness. Wendee Copp

## 2016-09-15 NOTE — Consult Note (Signed)
           [facility] CM Primary Care Navigator  09/15/2016  Alexis Higgins [facility] 06-01-1940 [id number]   Went to see patient at the bedside to identify possible discharge needs but she was alreadydischarged per staff report.  Patient was discharged home today with home health services ([facility]).  Primary care provider's office called (Lucy B.) to notify of patient's discharge and need for post hospital follow-up and transition of care.   Made aware to refer patient to [facility] care management ifdeemed appropriatefor services.   For questions, please contact:  Dannielle Huh, BSN, RN- [facility] Primary Care Navigator  Telephone: [phone] [place]

## 2016-09-15 NOTE — Care Management Note (Signed)
Case Management Note  Patient Details  Name: Alexis Higgins MRN: [id number] Date of Birth: 03/31/1941  Subjective/Objective:                    Action/Plan: Pt discharging home today. Pt already set up with [facility]. CM left message with Stanton Kidney at [facility] her of d/c.  Patients husband to provide transportation home. Pt is O2 dependent. Husband states he has her portable oxygen tank in the car.  Husband denies any other DME needs.    Expected Discharge Date:  09/15/16               Expected Discharge Plan:  [place]  In-House Referral:     Discharge planning Services  CM Consult  Post Acute Care Choice:  Home Health Choice offered to:  Patient, Spouse  DME Arranged:    DME Agency:     HH Arranged:  RN, PT Agency Agency:  [facility] (now [facility])  Status of Service:  Completed, signed off  If discussed at [place] of Stay Meetings, dates discussed:    Additional Comments:  Pollie Friar, RN 09/15/2016, 12:28 PM

## 2016-09-15 NOTE — Care Management Important Message (Signed)
Important Message  Patient Details  Name: Alexis Higgins MRN: [id number] Date of Birth: 02-Feb-1941   Medicare Important Message Given:  Yes    Nachman Sundt 09/15/2016, 12:01 PM

## 2016-09-16 NOTE — Telephone Encounter (Signed)
Called pt/husband back this am completed TCM call below.../lmb  Transition Care Management Follow-up Telephone Call   Date discharged? 09/15/16   How have you been since you were released from the hospital? Husband states she is ok just trying to adjust being home   Do you understand why you were in the hospital? YES   Do you understand the discharge instructions? YES   Where were you discharged to? Home   Items Reviewed:  Medications reviewed: YES  Allergies reviewed: YES  Dietary changes reviewed: YES  Referrals reviewed: YES, [facility] has reached out to them this am   Functional Questionnaire:   Activities of Daily Living (ADLs):   He states she are independent in the following: bathing and hygiene, feeding, continence, grooming, toileting and dressing States she require assistance with the following: ambulation and bathing and hygiene   Any transportation issues/concerns?: NO   Any patient concerns? NO   Confirmed importance and date/time of follow-up visits scheduled YES, appt 09/29/16  Provider Appointment booked with Dr. Jenny Reichmann  Confirmed with patient if condition begins to worsen call PCP or go to the ER.  Patient was given the office number and encouraged to call back with question or concerns.  : YES

## 2016-09-17 DIAGNOSIS — I739 Peripheral vascular disease, unspecified: Secondary | ICD-10-CM | POA: Diagnosis not present

## 2016-09-17 DIAGNOSIS — I2721 Secondary pulmonary arterial hypertension: Secondary | ICD-10-CM | POA: Diagnosis not present

## 2016-09-17 DIAGNOSIS — J449 Chronic obstructive pulmonary disease, unspecified: Secondary | ICD-10-CM | POA: Diagnosis not present

## 2016-09-17 DIAGNOSIS — M8008XD Age-related osteoporosis with current pathological fracture, vertebra(e), subsequent encounter for fracture with routine healing: Secondary | ICD-10-CM | POA: Diagnosis not present

## 2016-09-17 DIAGNOSIS — R239 Unspecified skin changes: Secondary | ICD-10-CM | POA: Diagnosis not present

## 2016-09-17 DIAGNOSIS — E785 Hyperlipidemia, unspecified: Secondary | ICD-10-CM | POA: Diagnosis not present

## 2016-09-17 DIAGNOSIS — I2510 Atherosclerotic heart disease of native coronary artery without angina pectoris: Secondary | ICD-10-CM | POA: Diagnosis not present

## 2016-09-17 DIAGNOSIS — M1991 Primary osteoarthritis, unspecified site: Secondary | ICD-10-CM | POA: Diagnosis not present

## 2016-09-17 DIAGNOSIS — J9611 Chronic respiratory failure with hypoxia: Secondary | ICD-10-CM | POA: Diagnosis not present

## 2016-09-17 DIAGNOSIS — Z955 Presence of coronary angioplasty implant and graft: Secondary | ICD-10-CM | POA: Diagnosis not present

## 2016-09-17 DIAGNOSIS — I10 Essential (primary) hypertension: Secondary | ICD-10-CM | POA: Diagnosis not present

## 2016-09-20 ENCOUNTER — Encounter: Payer: Self-pay | Admitting: Cardiovascular Disease

## 2016-09-20 ENCOUNTER — Encounter: Payer: Self-pay | Admitting: Emergency Medicine

## 2016-09-21 ENCOUNTER — Other Ambulatory Visit (HOSPITAL_COMMUNITY): Payer: Self-pay | Admitting: General Surgery

## 2016-09-21 ENCOUNTER — Other Ambulatory Visit: Payer: Self-pay | Admitting: Interventional Radiology

## 2016-09-21 ENCOUNTER — Other Ambulatory Visit (HOSPITAL_COMMUNITY): Payer: Self-pay | Admitting: Diagnostic Radiology

## 2016-09-21 DIAGNOSIS — M549 Dorsalgia, unspecified: Secondary | ICD-10-CM

## 2016-09-22 ENCOUNTER — Ambulatory Visit
Admission: RE | Admit: 2016-09-22 | Discharge: 2016-09-22 | Disposition: A | Discharge status: Home / Self Care | Payer: Medicare Other | Source: Ambulatory Visit | Attending: Diagnostic Radiology | Admitting: Diagnostic Radiology

## 2016-09-22 ENCOUNTER — Telehealth: Payer: Self-pay | Admitting: Internal Medicine

## 2016-09-22 ENCOUNTER — Ambulatory Visit
Admission: RE | Admit: 2016-09-22 | Discharge: 2016-09-22 | Disposition: A | Discharge status: Home / Self Care | Payer: Medicare Other | Source: Ambulatory Visit | Attending: Interventional Radiology | Admitting: Interventional Radiology

## 2016-09-22 DIAGNOSIS — M549 Dorsalgia, unspecified: Secondary | ICD-10-CM | POA: Diagnosis not present

## 2016-09-22 HISTORY — PX: IR RADIOLOGIST EVAL & MGMT: IMG5224

## 2016-09-22 NOTE — Telephone Encounter (Signed)
LVM for Alexis Higgins for the verbal orders.

## 2016-09-22 NOTE — Consult Note (Signed)
Chief Complaint: Patient was seen in consultation today for  Chief Complaint  Patient presents with  . Follow-up    continued back pain 1 wk s/p:  Kyphoplasty     at the request of Jaydence Vanyo  Referring Physician(s): Corrie Mckusick DO  History of Present Illness: Alexis Higgins is a 76 y.o. female with oxygen and steroid dependent COPD, osteoporosis and recurrent vertebral body compression fractures. Patient has undergone vertebral body augmentation procedures at T5 and T10 in the past. Patient recently underwent T11 kyphoplasty on 09/14/2016. Patient was an inpatient at the time of the procedure because she was having such severe pain. In the past, patient states that her fractures were causing pain that radiated around the sides into the rib areas. That radiating pain went away following the previous vertebral augmentation procedures. Following the recent kyphoplasty, she got some immediate relief following the procedure. However, her anterior chest pain, which is what she initially presented with a couple weeks ago, is persistent and she feels that it is slowly getting worse. Patient is trying to avoid using Dilaudid because it causes some mental status changes. She is taking tramadol and been using topical agents such as lidocaine patches and Voltaren.  The Voltaren seems to give her the most relief at her site of pain. She has no neurologic symptoms following the kyphoplasty procedure. She does not complain of any significant back pain.  Past Medical History:  Diagnosis Date  . Abnormal chest x-ray    03/2012: will need OP f/u.  Marland Kitchen Anginal pain ([place])   . ANXIETY 01/01/2007  . BURSITIS, RIGHT HIP 06/04/2009  . CAD (coronary artery disease)    a. BMS to LAD 2010. b. NSTEMI with DES to LAD for ISR 2011. c. Patent stent 03/2012/Imdur added.  . Cataract   . CHEST PAIN-PRECORDIAL 01/15/2009  . Colon polyps    H/o tubular adenoma of colon  . COPD 01/01/2007   a. Chronic resp failure on  home O2.  Marland Kitchen DEPRESSION 01/01/2007  . Eczema 01/08/2011  . GROIN PAIN 06/20/2008  . Headache(784.0) 01/01/2007  . Hemorrhoid   . HYPERTENSION 01/01/2007  . Impaired glucose tolerance 01/07/2011  . LOW BACK PAIN 01/01/2007  . Muscle weakness (generalized) 06/04/2009  . On home oxygen therapy    "2L; 24/7" (09/10/2016)  . OSTEOARTHRITIS, HIP 07/01/2008  . OSTEOPOROSIS 01/01/2007  . Pneumonia 1998  . Rosacea 01/08/2011  . Shortness of breath   . SYNCOPE 01/01/2007  . TIA (transient ischemic attack)   . TRANSIENT ISCHEMIC ATTACK, HX OF 01/01/2007    Past Surgical History:  Procedure Laterality Date  . ABDOMINAL HYSTERECTOMY    . APPENDECTOMY    . BACK SURGERY    . BREAST ENHANCEMENT SURGERY    . COLONOSCOPY  01/25/2002   tubular adenoma,hemorrhoids, hyperplastic  colon polyps  . COLONOSCOPY  02/17/2005   hemorrhoids  . CORONARY ANGIOPLASTY    . CORONARY STENT PLACEMENT    . HIP ARTHROPLASTY Left 10/01/2013   Procedure: ARTHROPLASTY UNIPOLAR   HIP;  Surgeon: Marianna Payment, MD;  Location: [facility];  Service: Orthopedics;  Laterality: Left;  . HIP SURGERY Left    DR XU     PROXIMAL NECK   . IR GENERIC HISTORICAL  02/19/2016   IR VERTEBROPLASTY CERV/THOR BX INC UNI/BIL INC/INJECT/IMAGING 02/19/2016 Luanne Bras, MD [facility]  . IR GENERIC HISTORICAL  07/15/2016   IR KYPHO THORACIC WITH BONE BIOPSY 07/15/2016 Luanne Bras, MD [facility]  . IR  KYPHO EA ADDL LEVEL THORACIC OR LUMBAR  09/14/2016  . LEFT HEART CATHETERIZATION WITH CORONARY ANGIOGRAM N/A 04/13/2012   Procedure: LEFT HEART CATHETERIZATION WITH CORONARY ANGIOGRAM;  Surgeon: Burnell Blanks, MD;  Location: [facility] CATH LAB;  Service: Cardiovascular;  Laterality: N/A;  . OOPHORECTOMY     one ovary  . s/p bilat cataract  2010  . sp lumbar disc surgury     [name]    Allergies: Aspirin; Codeine; and Other  Medications: Prior to Admission medications   Medication Sig Start Date End Date Taking? Authorizing  Provider  ascorbic acid (VITAMIN C) 1000 MG tablet Take 1,000 mg by mouth daily.   Yes Historical Provider, MD  Cholecalciferol (VITAMIN D) 1000 UNITS capsule Take 1,000 Units by mouth every evening.    Yes Historical Provider, MD  clopidogrel (PLAVIX) 75 MG tablet TAKE 1 TABLET (75 MG TOTAL) BY MOUTH DAILY. 12/11/15  Yes Burnell Blanks, MD  diclofenac sodium (VOLTAREN) 1 % GEL Apply 4 g topically 4 (four) times daily. 09/10/16  Yes April Palumbo, MD  docusate sodium (COLACE) 100 MG capsule Take 100 mg by mouth daily as needed. For constipation   Yes Historical Provider, MD  furosemide (LASIX) 20 MG tablet Take 1 tablet (20 mg total) by mouth daily. 06/15/16  Yes Biagio Borg, MD  isosorbide mononitrate (IMDUR) 30 MG 24 hr tablet TAKE 0.5 TABLETS (15 MG TOTAL) BY MOUTH DAILY. 12/11/15  Yes Burnell Blanks, MD  lovastatin (MEVACOR) 20 MG tablet TAKE 1 TABLET BY MOUTH AT BEDTIME 03/10/16  Yes Biagio Borg, MD  metoprolol tartrate (LOPRESSOR) 25 MG tablet TAKE 1/2 TABLET BY MOUTH 2 TIMES A DAY 02/10/16  Yes Historical Provider, MD  nitroGLYCERIN (NITROSTAT) 0.4 MG SL tablet Place 1 tablet (0.4 mg total) under the tongue every 5 (five) minutes as needed for chest pain (up to 3 doses). 04/13/12  Yes Dayna N Dunn, PA-C  nortriptyline (PAMELOR) 10 MG capsule Take 20 mg by mouth at bedtime.  07/24/13  Yes Historical Provider, MD  ondansetron (ZOFRAN) 4 MG tablet Take 1 tablet (4 mg total) by mouth every 8 (eight) hours as needed for nausea or vomiting. Patient taking differently: Take 4 mg by mouth 2 (two) times daily.  01/06/16  Yes Courteney Lyn Mackuen, MD  polyethylene glycol (MIRALAX / GLYCOLAX) packet Take 17 g by mouth daily as needed for mild constipation.   Yes Historical Provider, MD  polyvinyl alcohol (LIQUIFILM TEARS) 1.4 % ophthalmic solution Place 1 drop into both eyes daily as needed.    Yes Historical Provider, MD  predniSONE (DELTASONE) 10 MG tablet Take 15 mg by mouth daily with  breakfast.   Yes Historical Provider, MD  PROAIR HFA 108 (90 Base) MCG/ACT inhaler INHALE 2 PUFFS INTO THE LUNGS EVERY 6 (SIX) HOURS AS NEEDED FOR WHEEZING OR SHORTNESS OF BREATH. 07/20/16  Yes Biagio Borg, MD  tiotropium (SPIRIVA HANDIHALER) 18 MCG inhalation capsule Place 1 capsule (18 mcg total) into inhaler and inhale daily at 6 (six) AM. 05/26/16  Yes Biagio Borg, MD  traMADol (ULTRAM) 50 MG tablet Take 100 mg by mouth 2 (two) times daily.    Yes Historical Provider, MD  vitamin B-12 (CYANOCOBALAMIN) 1000 MCG tablet Take 1,000 mcg by mouth daily.   Yes Historical Provider, MD  HYDROmorphone (DILAUDID) 2 MG tablet Take 0.5 tablets (1 mg total) by mouth every 2 (two) hours as needed for severe pain. Patient not taking: Reported on 09/22/2016 09/14/16  Costin Karlyne Greenspan, MD  lidocaine (LIDODERM) 5 % Place 1 patch onto the skin daily. Remove & Discard patch within 12 hours or as directed by MD Patient not taking: Reported on 09/22/2016 02/05/16   Margette Fast, MD     Family History  Problem Relation Age of Onset  . Osteoporosis Mother   . Heart disease Sister   . Emphysema Sister   . Seizures Sister     epilepsy  . Cardiomyopathy Sister   . Heart attack Sister     Social History   Social History  . Marital status: Married    Spouse name: N/A  . Number of children: 4  . Years of education: N/A   Occupational History  . disabled back since 2003    Social History Main Topics  . Smoking status: Former Smoker    Packs/day: 1.50    Years: 51.00    Types: Cigarettes    Quit date: 07/24/2005  . Smokeless tobacco: Never Used  . Alcohol use No  . Drug use: No  . Sexual activity: Not on file   Other Topics Concern  . Not on file   Social History Narrative  . No narrative on file      Review of Systems  Respiratory: Positive for shortness of breath.   Musculoskeletal:       Anterior chest pain.  Neurological: Negative for weakness.    Vital Signs: BP (!) 142/76 (BP Location:  Left Arm, Patient Position: Sitting, Cuff Size: Normal)   Pulse 81   Temp 98.3 F (36.8 C) (Oral)   Resp 16   Ht 5\' 6"  (1.676 m)   Wt 148 lb (67.1 kg)   SpO2 97%   BMI 23.89 kg/m   Physical Exam  Constitutional: She is oriented to person, place, and time.  Patient appears frail. She is on supplemental oxygen.  She has multiple areas of bruising throughout the arms.  Musculoskeletal:  Small bandage was removed from the previous kyphoplasty puncture. Puncture site is well-healed. Patient does not complain of pain with deep palpation along the spinous processes in the thoracic and lumbar spine.  Patient complains of pain with palpation of the lower sternum particularly in the right lower sternum just below the right breast. There is also some pain below the left breast area.  Neurological: She is alert and oriented to person, place, and time.        Imaging: Dg Thoracic Spine 2 View  Result Date: 09/22/2016 CLINICAL DATA:  Dorsalgia EXAM: THORACIC SPINE 2 VIEWS COMPARISON:  Chest CT with bony reformats September 10, 2016 FINDINGS: Frontal and lateral views obtained. Patient is undergone kyphoplasty procedures at T11, T10, and T5. There is stable anterior wedging at T6. There is no new fracture. No spondylolisthesis. No appreciable extravasation of contrast from the respective vertebral bodies. There is disc space narrowing at several levels consistent with osteoarthritic change. There is no erosive change or paraspinous lesion. IMPRESSION: Several prior anterior wedge compression fractures, unchanged. Status post kyphoplasty procedures at several levels. Note that the T11 kyphoplasty is new. No new fracture. No spondylolisthesis. Osteoarthritic changes several levels. Electronically Signed   By: Lowella Grip III M.D.   On: 09/22/2016 10:06   Ct Angio Chest Pe W And/or Wo Contrast  Result Date: 09/10/2016 CLINICAL DATA:  Sternal chest pain, back pain, known compression fractures. EXAM:  CT ANGIOGRAPHY CHEST WITH CONTRAST TECHNIQUE: Multidetector CT imaging of the chest was performed using the standard protocol during bolus  administration of intravenous contrast. Multiplanar CT image reconstructions and MIPs were obtained to evaluate the vascular anatomy. CONTRAST:  100 cc Isovue 370 IV COMPARISON:  Thoracic spine MRI 09/07/2016, chest radiograph 06/30/2016. Chest CT 01/06/2016 FINDINGS: Cardiovascular: No aortic dissection or aneurysm, mild aortic atherosclerosis. The heart is normal in size. There are no filling defects within the pulmonary arteries to suggest pulmonary embolus. Coronary artery calcifications versus stents. Mediastinum/Nodes: No mediastinal or hilar adenopathy. Calcified right thyroid nodule is again seen. The esophagus is decompressed. Lungs/Pleura: Advanced emphysema. Mild central bronchial thickening. Previous 3 mm pleural-based nodule is grossly stable, image 48 series 6, less well-defined currently. No new pulmonary nodule. No consolidation. No pulmonary edema. No pneumothorax or pleural fluid. Upper Abdomen: Ingested material within the stomach. No acute abnormality. Musculoskeletal: T10 and T5 vertebral compression fractures with augmentation. T11 compression fracture involving superior endplate, characterized on recent MRI. Sternum is intact. No acute osseous abnormality. Review of the MIP images confirms the above findings. IMPRESSION: 1. No pulmonary embolus. 2. Advanced emphysema.  Central bronchial thickening is chronic. 3. Mild aortic atherosclerosis.  Coronary artery calcifications. 4. Thoracic spine compression fractures, T10 and T5 treated with vertebral augmentation. T11 compression fracture, as characterized on recent MRI. Electronically Signed   By: Jeb Levering M.D.   On: 09/10/2016 03:52   Mr Thoracic Spine W Wo Contrast  Result Date: 09/07/2016 CLINICAL DATA:  76 year old female with history of prior kyphoplasty. Mid back pain onset 10 days ago.  Difficulty ambulating. Weakness. Subsequent encounter. EXAM: MRI THORACIC WITHOUT AND WITH CONTRAST TECHNIQUE: Multiplanar and multiecho pulse sequences of the thoracic spine were obtained without and with intravenous contrast. CONTRAST:  78mL MULTIHANCE GADOBENATE DIMEGLUMINE 529 MG/ML IV SOLN COMPARISON:  07/15/2016 kyphoplasty. 07/06/2016 in 12/2013 2017 MR. 01/06/2016 CT. FINDINGS: MRI THORACIC SPINE FINDINGS Alignment:  Mild thoracic kyphosis. Vertebrae: Remote T5 compression fracture treated with cement augmentation. T10 compression fracture recently treated with cement augmentation (07/15/2016). Interval further loss of height of the T10 vertebra (approximately 15-20%). No significant change nonspecific signal intensity within the right T9 facet/rib articulation possibly hemangioma as previously noted. Stress phenomena could cause a similar appearance. Malignancy less likely consideration. New T11 superior endplate compression fracture with subchondral edema, 25-30% loss of height and mild retropulsion posterosuperior aspect contributing to narrowing of the ventral thecal sac approaching but not compressing the cord. Cord:  No abnormal signal or enhancement. Paraspinal and other soft tissues: No worrisome abnormality. Atherosclerotic changes thoracic aorta. Disc levels: C6-7: Cervical spondylotic changes suspected incompletely assessed. C7-T1:  Minimal anterior slips C7.  Minimal bulge. T1-2:  Negative. T2-3:  Minimal bulge. T3-4:  Negative. T4-5:  Minimal bulge right paracentral position. T5-6:  Bulge/spur with slight narrowing ventral thecal sac. T6-7:  Tiny left paracentral protrusion. T7-8:  Negative. T8-9: Bulge slightly greater to right. Slight narrowing ventral thecal sac. T9-10:  Minimal spur. T10-11: Retropulsed posterosuperior aspect T11 vertebral body/spur with narrowing ventral thecal sac greater on the right. T11-12:  Minimal bulge. T12-L1:  Minimal bulge.  Schmorl's node deformity. L1-2:  Small  Schmorl's node deformity. L2-3:  Minimal Schmorl's node deformity. IMPRESSION: New T11 superior endplate compression fracture with subchondral edema, 25-30% loss of height and mild retropulsion posterosuperior aspect contributing to narrowing of the ventral thecal sac approaching but not compressing the cord. T10 compression fracture recently treated with cement augmentation (07/15/2016). Interval further loss of height of the T10 vertebra (approximately 15-20%). Remote T5 compression fracture treated with cement augmentation. No significant change nonspecific signal intensity within the  right T9 facet/rib articulation possibly hemangioma as previously noted. Stress phenomena could cause a similar appearance. Malignancy less likely consideration. Electronically Signed   By: Genia Del M.D.   On: 09/07/2016 08:16   Ir Kypho Ea Addl Level Thoracic Or Lumbar  Result Date: 09/14/2016 INDICATION: 76 year old female with new T11 compression fracture and lifestyle limiting 10 out of 10 pain. EXAM: IR KYPHO VERTEBRAL ADDL AGMENTATION COMPARISON:  MRI 09/07/2016, CT 09/10/2016 MEDICATIONS: As antibiotic prophylaxis, 2.0 g Ancef was ordered pre-procedure and administered intravenously within 1 hour of incision. ANESTHESIA/SEDATION: Moderate (conscious) sedation was employed during this procedure. A total of Versed 2.0 mg and Fentanyl 100 mcg, 1 mg Dilaudid was administered intravenously. Moderate Sedation Time: 30 minutes. The patient's level of consciousness and vital signs were monitored continuously by radiology nursing throughout the procedure under my direct supervision. FLUOROSCOPY TIME:  Fluoroscopy Time: 5 minutes 12 seconds (59 mGy) COMPLICATIONS: None PROCEDURE: Following a full explanation of the procedure along with the potentially associated complications, a witnessed informed consent was obtained. Specific risks that were discussed included bleeding, infection, injury to adjacent structures, neurologic  injury, embolization of cement within the veins, failure of the procedure to improve pain, need for further procedure/ surgery, cardiopulmonary collapse, death. The patient understands the risks and wishes to proceed. The patient was placed prone on the fluoroscopic table. Nasal oxygen was administered. Physiologic monitoring was performed throughout the duration of the procedure. The skin overlying the T11 region was prepped and draped in the usual sterile fashion. The T11 vertebral body was identified and 1% lidocaine was used to anesthetize the skin overlying the right pedicle to the level of the bone. A small incision was made with 11 blade scalpel, and then a Medtronic 8 gauge needle was advanced to the bone adjacent to the right pedicle via extra pedicular approach into the anterior 1/3 of the vertebral body. Inflation of the right pedicular cannula balloon was performed under fluoroscopic observation, with central balloon location within the vertebral body. Methylmethacrylate mixture was then reconstituted. Under biplane intermittent fluoroscopy, the methylmethacrylate was then hand injected into the cavity at T11 vertebral body with filling of the fracture cleft. No extravasation was noted posteriorly into the spinal canal. No epidural venous contamination was seen. The cannula was then removed. Hemostasis was achieved at the skin entry site. There were no acute complications. Patient tolerated the procedure well. The patient was observed for 30 minutes and returned to her room in good condition. IMPRESSION: Status post right sided extra pedicular approach for kyphoplasty treatment of new T11 compression fracture. Signed, Dulcy Fanny. Earleen Newport, DO [facility] Radiology Electronically Signed   By: Corrie Mckusick D.O.   On: 09/14/2016 13:32     I reviewed the chest CT from 09/10/2016 which was performed in the emergency department for sternal chest pain. I do not  clearly identify a sternal fracture but the patient clearly has right anterior rib fractures involving the second, third, fourth and fifth ribs. These anterior right rib fractures are new from a chest CT dated 01/06/2016. In addition, the anterior right fifth fracture which is along the lower aspect of the sternum and just below the right breast implant appears to be more acute than the other fractures. I suspect that the fractures are of different ages.  Labs:  CBC:  Recent Labs  07/15/16 1016 07/15/16 1122 09/09/16 2245 09/11/16 0312 09/13/16 0505  WBC 15.0*  --  19.1* 11.8* 8.3  HGB 13.8 13.3  13.2 12.8 10.6*  HCT 43.1 39.0 40.6 42.2 34.4*  PLT 288  --  233 272 227    COAGS:  Recent Labs  02/19/16 0823 07/15/16 1016 09/14/16 0850  INR 0.89 0.85 0.92  APTT 22* 22*  --     BMP:  Recent Labs  06/30/16 1827 07/15/16 1122  09/09/16 2245 09/10/16 1538 09/11/16 0312 09/13/16 0505  NA 141 142  --  145  --  143 138  K 3.2* 3.4*  --  3.2*  --  4.3 4.6  CL 94* 96*  --  99*  --  97* 97*  CO2 36*  --   --  33*  --  36* 34*  GLUCOSE 218* 107*  --  130*  --  116* 124*  BUN 13 13  --  12  --  12 13  CALCIUM 9.0  --   --  9.1  --  9.1 8.7*  CREATININE 1.13* 1.10*  < > 1.10* 1.01* 1.10* 0.99  GFRNONAA 47*  --   --  48* 53* 48* 54*  GFRAA 54*  --   --  55* >60 55* >60  < > = values in this interval not displayed.  LIVER FUNCTION TESTS:  Recent Labs  01/06/16 1120 02/05/16 0856  BILITOT 0.4 0.4  AST 18 13  ALT 16 11  ALKPHOS 57 78  PROT 5.7* 6.1  ALBUMIN 3.4* 3.7    TUMOR MARKERS: No results for input(s): AFPTM, CEA, CA199, CHROMGRNA in the last 8760 hours.  Assessment and Plan:  76 year old with history of multiple vertebral body compression fractures and status post T11 kyphoplasty last week. Patient has gotten only minimal relief of her anterior chest pain since the kyphoplasty procedure. After reviewing the recent imaging and correlating with physical exam  findings, there are no complications from the O11 kyphoplasty. In fact, patient has basically no pain at the level of T11. There is also no new fracture based on today's thoracic spine images. However, the patient does have right anterior rib fractures which appear to correlate with her area of pain and symptoms. I suspect that the anterior right fifth rib fracture is the most acute fracture and it is probably giving the patient her current symptoms. I explained to the patient and her husband that there is not a quick remedy for these rib fractures. However, I do think that her pain should slowly improve as the right fifth rib fracture heals. We also discussed ways of trying to avoid additional rib fractures in the future although this may be difficult since the patient has difficulty moving without assistance. Patient is scheduled to follow-up with her pain doctor in approximately a week. I did refill the patient's prescription for the Voltaren because this is giving her the most pain relief in the anterior chest. She is at risk for recurrent fractures based on her osteoporosis and steroid dependent COPD. Patient will follow-up with interventional radiology as needed.  Thank you for this interesting consult.  I greatly enjoyed meeting Alexis Higgins and look forward to participating in their care.  A copy of this report was sent to the requesting provider on this date.  Electronically Signed: Carylon Perches 09/22/2016, 11:20 AM   I spent a total of    25 Minutes in face to face in clinical consultation, greater than 50% of which was counseling/coordinating care for kyphoplasty follow-up.

## 2016-09-22 NOTE — Telephone Encounter (Signed)
Ok for verbals, thanks 

## 2016-09-22 NOTE — Telephone Encounter (Signed)
Pt was released from the hospital on 09/15/16.  Elmyra Ricks from [facility] is requesting verbal orders for skilled nursing to go out to the pts home for 2 times a week for 8 weeks for COPD education and medication management.  She also wanted to let Dr Jenny Reichmann know that while she is in the hospital, they put paper skin on her. She had skin tears on both arms and they covered it with Tegaderm.

## 2016-09-23 ENCOUNTER — Ambulatory Visit: Payer: Medicare Other | Admitting: Emergency Medicine

## 2016-09-24 ENCOUNTER — Telehealth: Payer: Self-pay | Admitting: Internal Medicine

## 2016-09-24 NOTE — Telephone Encounter (Signed)
I would not end the home care, but let pt know they can refuse the vital signs if needed.  It is very important to at least be visually examined and a brief history taken on a frequent basis to monitor her course, thanks

## 2016-09-24 NOTE — Telephone Encounter (Signed)
Noted, husband is obviously under a great strain in caring for his wife with terminal illness.

## 2016-09-24 NOTE — Telephone Encounter (Signed)
Pt's husband was very aggressive on the phone when I returned his call. He stated he does not know why I am calling. I tried to explain to him who I was and where I was calling from but he frequently cut me off somewhat yelling in the phone asking "Who are you?". He states that he does not want his wife to have any type of therapy and would like it to be put on hold. He yelled saying that therapy had not started yet and said they only had an initial meeting with the people at [facility]. He then proceeded to be condescending and stated "What is so hard to understand about that?". I politely stated, Sir I understand and that I was just following up on the phone call that I received from [place]. He said she has an appt coming up on Wednesday and that he will discuss it with Dr. Jenny Reichmann then and proceeded to hang up.

## 2016-09-24 NOTE — Telephone Encounter (Signed)
States spouse called stating patient has 4 rib fractures.  States therapy and nursing care as far as talking BP hurts patient too much and is requesting to end home health care right now.

## 2016-09-29 ENCOUNTER — Other Ambulatory Visit: Payer: Self-pay | Admitting: *Deleted

## 2016-09-29 ENCOUNTER — Encounter: Payer: Self-pay | Admitting: Internal Medicine

## 2016-09-29 ENCOUNTER — Ambulatory Visit (INDEPENDENT_AMBULATORY_CARE_PROVIDER_SITE_OTHER): Payer: Medicare Other | Admitting: Internal Medicine

## 2016-09-29 VITALS — BP 158/86 | HR 91 | Ht 66.0 in | Wt 148.0 lb

## 2016-09-29 DIAGNOSIS — M549 Dorsalgia, unspecified: Secondary | ICD-10-CM

## 2016-09-29 DIAGNOSIS — R0789 Other chest pain: Secondary | ICD-10-CM | POA: Diagnosis not present

## 2016-09-29 DIAGNOSIS — R52 Pain, unspecified: Secondary | ICD-10-CM

## 2016-09-29 DIAGNOSIS — M810 Age-related osteoporosis without current pathological fracture: Secondary | ICD-10-CM

## 2016-09-29 DIAGNOSIS — R079 Chest pain, unspecified: Secondary | ICD-10-CM

## 2016-09-29 MED ORDER — FUROSEMIDE 20 MG PO TABS: 20.0000 mg | ORAL_TABLET | Freq: Every day | ORAL | 1 refills | Status: DC

## 2016-09-29 MED ORDER — KETOROLAC TROMETHAMINE 30 MG/ML IJ SOLN
30.0000 mg | Freq: Once | INTRAMUSCULAR | Status: AC
  Administered 2016-09-29: 30 mg via INTRAMUSCULAR

## 2016-09-29 MED ORDER — DICLOFENAC SODIUM 1 % TD GEL: 4.0000 g | Freq: Four times a day (QID) | TRANSDERMAL | 0 refills | Status: DC

## 2016-09-29 MED ORDER — DULOXETINE HCL 30 MG PO CPEP: 30.0000 mg | ORAL_CAPSULE | Freq: Every day | ORAL | 3 refills | Status: DC

## 2016-09-29 NOTE — Progress Notes (Signed)
Subjective:    Patient ID: Alexis Higgins, female    DOB: 04-29-1941, 76 y.o.   MRN: [id number]  HPI  Here after recent hospn 4/20-25  - pt is 76 year old female COPD, steroid dependent, chronic hypoxic respiratory failure on home O2, osteoporosis, recurrent vertebral fractures with recent diagnosis of T11 compression fracture, seen in the ER twice for excruciating pain presented with uncontrolled back pain with oral medications. Treated with kyphoplasty 4/24.  Also now determined to have mult right rib fx.  Seen and tx per PT, OT.  Now off the hydromorphone, still with mild to mod pain overall, sees pain mangement tomorrow. Advised for cbc/bmp f/u today.  Today, Pt denies increased sob or doe, wheezing, orthopnea, PND, increased LE swelling, palpitations, dizziness or syncope. Pt denies new neurological symptoms such as new headache, or facial or extremity weakness or numbness   Pt denies polydipsia, polyuria Past Medical History:  Diagnosis Date  . Abnormal chest x-ray    03/2012: will need OP f/u.  Marland Kitchen Anginal pain ([place])   . ANXIETY 01/01/2007  . BURSITIS, RIGHT HIP 06/04/2009  . CAD (coronary artery disease)    a. BMS to LAD 2010. b. NSTEMI with DES to LAD for ISR 2011. c. Patent stent 03/2012/Imdur added.  . Cataract   . CHEST PAIN-PRECORDIAL 01/15/2009  . Colon polyps    H/o tubular adenoma of colon  . COPD 01/01/2007   a. Chronic resp failure on home O2.  Marland Kitchen DEPRESSION 01/01/2007  . Eczema 01/08/2011  . GROIN PAIN 06/20/2008  . Headache(784.0) 01/01/2007  . Hemorrhoid   . HYPERTENSION 01/01/2007  . Impaired glucose tolerance 01/07/2011  . LOW BACK PAIN 01/01/2007  . Muscle weakness (generalized) 06/04/2009  . On home oxygen therapy    "2L; 24/7" (09/10/2016)  . OSTEOARTHRITIS, HIP 07/01/2008  . OSTEOPOROSIS 01/01/2007  . Pneumonia 1998  . Rosacea 01/08/2011  . Shortness of breath   . SYNCOPE 01/01/2007  . TIA (transient ischemic attack)   . TRANSIENT ISCHEMIC ATTACK, HX OF 01/01/2007    Past Surgical History:  Procedure Laterality Date  . ABDOMINAL HYSTERECTOMY    . APPENDECTOMY    . BACK SURGERY    . BREAST ENHANCEMENT SURGERY    . COLONOSCOPY  01/25/2002   tubular adenoma,hemorrhoids, hyperplastic  colon polyps  . COLONOSCOPY  02/17/2005   hemorrhoids  . CORONARY ANGIOPLASTY    . CORONARY STENT PLACEMENT    . HIP ARTHROPLASTY Left 10/01/2013   Procedure: ARTHROPLASTY UNIPOLAR   HIP;  Surgeon: Marianna Payment, MD;  Location: [place];  Service: Orthopedics;  Laterality: Left;  . HIP SURGERY Left    DR XU     PROXIMAL NECK   . IR GENERIC HISTORICAL  02/19/2016   IR VERTEBROPLASTY CERV/THOR BX INC UNI/BIL INC/INJECT/IMAGING 02/19/2016 Luanne Bras, MD [facility]  . IR GENERIC HISTORICAL  07/15/2016   IR KYPHO THORACIC WITH BONE BIOPSY 07/15/2016 Luanne Bras, MD [facility]  . IR KYPHO EA ADDL LEVEL THORACIC OR LUMBAR  09/14/2016  . LEFT HEART CATHETERIZATION WITH CORONARY ANGIOGRAM N/A 04/13/2012   Procedure: LEFT HEART CATHETERIZATION WITH CORONARY ANGIOGRAM;  Surgeon: Burnell Blanks, MD;  Location: [facility] CATH LAB;  Service: Cardiovascular;  Laterality: N/A;  . OOPHORECTOMY     one ovary  . s/p bilat cataract  2010  . sp lumbar disc surgury     [name]    reports that she quit smoking about 11 years ago. Her smoking use included Cigarettes.  She has a 76.50 pack-year smoking history. She has never used smokeless tobacco. She reports that she does not drink alcohol or use drugs. family history includes Cardiomyopathy in her sister; Emphysema in her sister; Heart attack in her sister; Heart disease in her sister; Osteoporosis in her mother; Seizures in her sister. Allergies  Allergen Reactions  . Aspirin Other (See Comments)     cns bleed risk  . Codeine Anaphylaxis and Rash  . Other Other (See Comments)    Stiolto - severe reaction - caused inability to breath   Current Outpatient Prescriptions on File Prior to Visit  Medication Sig  Dispense Refill  . ascorbic acid (VITAMIN C) 1000 MG tablet Take 1,000 mg by mouth daily.    . Cholecalciferol (VITAMIN D) 1000 UNITS capsule Take 1,000 Units by mouth every evening.     . clopidogrel (PLAVIX) 75 MG tablet TAKE 1 TABLET (75 MG TOTAL) BY MOUTH DAILY. 30 tablet 11  . docusate sodium (COLACE) 100 MG capsule Take 100 mg by mouth daily as needed. For constipation    . isosorbide mononitrate (IMDUR) 30 MG 24 hr tablet TAKE 0.5 TABLETS (15 MG TOTAL) BY MOUTH DAILY. 45 tablet 3  . lidocaine (LIDODERM) 5 % Place 1 patch onto the skin daily. Remove & Discard patch within 12 hours or as directed by MD 30 patch 0  . lovastatin (MEVACOR) 20 MG tablet TAKE 1 TABLET BY MOUTH AT BEDTIME 90 tablet 3  . metoprolol tartrate (LOPRESSOR) 25 MG tablet TAKE 1/2 TABLET BY MOUTH 2 TIMES A DAY  11  . nitroGLYCERIN (NITROSTAT) 0.4 MG SL tablet Place 1 tablet (0.4 mg total) under the tongue every 5 (five) minutes as needed for chest pain (up to 3 doses). 25 tablet 4  . ondansetron (ZOFRAN) 4 MG tablet Take 1 tablet (4 mg total) by mouth every 8 (eight) hours as needed for nausea or vomiting. (Patient taking differently: Take 4 mg by mouth 2 (two) times daily. ) 11 tablet 0  . polyethylene glycol (MIRALAX / GLYCOLAX) packet Take 17 g by mouth daily as needed for mild constipation.    . polyvinyl alcohol (LIQUIFILM TEARS) 1.4 % ophthalmic solution Place 1 drop into both eyes daily as needed.     . predniSONE (DELTASONE) 10 MG tablet Take 15 mg by mouth daily with breakfast.    . PROAIR HFA 108 (90 Base) MCG/ACT inhaler INHALE 2 PUFFS INTO THE LUNGS EVERY 6 (SIX) HOURS AS NEEDED FOR WHEEZING OR SHORTNESS OF BREATH. 25.5 Inhaler 2  . tiotropium (SPIRIVA HANDIHALER) 18 MCG inhalation capsule Place 1 capsule (18 mcg total) into inhaler and inhale daily at 6 (six) AM. 90 capsule 3  . traMADol (ULTRAM) 50 MG tablet Take 100 mg by mouth 2 (two) times daily.     . vitamin B-12 (CYANOCOBALAMIN) 1000 MCG tablet Take 1,000  mcg by mouth daily.     No current facility-administered medications on file prior to visit.     Review of Systems  Constitutional: Negative for other unusual diaphoresis or sweats HENT: Negative for ear discharge or swelling Eyes: Negative for other worsening visual disturbances Respiratory: Negative for stridor or other swelling  Gastrointestinal: Negative for worsening distension or other blood Genitourinary: Negative for retention or other urinary change Musculoskeletal: Negative for other MSK pain or swelling Skin: Negative for color change or other new lesions Neurological: Negative for worsening tremors and other numbness  Psychiatric/Behavioral: Negative for worsening agitation or other fatigue All other system neg  per pt    Objective:   Physical Exam BP (!) 158/86   Pulse 91   Ht 5\' 6"  (1.676 m)   Wt 148 lb (67.1 kg)   SpO2 98%   BMI 23.89 kg/m  VS noted, frail, thin Constitutional: Pt appears in NAD HENT: Head: NCAT.  Right Ear: External ear normal.  Left Ear: External ear normal.  Eyes: . Pupils are equal, round, and reactive to light. Conjunctivae and EOM are normal Nose: without d/c or deformity Neck: Neck supple. Gross normal ROM Cardiovascular: Normal rate and regular rhythm.   Pulmonary/Chest: Effort normal and breath sounds without rales or wheezing.  Abd:  Soft, NT, ND, + BS, no organomegaly Neurological: Pt is alert. At baseline orientation, motor grossly intact Skin: Skin is warm. No rashes, other new lesions, no LE edema Psychiatric: Pt behavior is normal without agitation  No other exam indings    Assessment & Plan:

## 2016-09-29 NOTE — Patient Instructions (Signed)
You had the toradol 30 mg shot today for pain  Please take all new medication as prescribed- the cymbalta, and call in 30 days for the higher dose if you are not doing well enough with pain  Ok to stop the nortryptilene  Please take all new medication as prescribed - the Prolia injection (I will let Jonelle Sidle the head nurse know to start the process)  Please continue all other medications as before, and refills have been done if requested.  Please have the pharmacy call with any other refills you may need.  Please keep your appointments with your specialists as you may have planned - pain management tomorrow  Please return in 6 months, or sooner if needed

## 2016-09-30 DIAGNOSIS — M961 Postlaminectomy syndrome, not elsewhere classified: Secondary | ICD-10-CM | POA: Diagnosis not present

## 2016-09-30 DIAGNOSIS — M47817 Spondylosis without myelopathy or radiculopathy, lumbosacral region: Secondary | ICD-10-CM | POA: Diagnosis not present

## 2016-09-30 DIAGNOSIS — G4700 Insomnia, unspecified: Secondary | ICD-10-CM | POA: Diagnosis not present

## 2016-09-30 DIAGNOSIS — G894 Chronic pain syndrome: Secondary | ICD-10-CM | POA: Diagnosis not present

## 2016-10-01 ENCOUNTER — Encounter: Payer: Self-pay | Admitting: Emergency Medicine

## 2016-10-01 NOTE — Telephone Encounter (Signed)
RB  Please Advise- Please see pt email

## 2016-10-03 NOTE — Assessment & Plan Note (Signed)
Ok for d/c pamelor, change to cymbalta 30 qd, also has pain management appt tomorrow

## 2016-10-03 NOTE — Addendum Note (Signed)
Addended by: Biagio Borg on: 10/03/2016 05:27 PM   Modules accepted: Level of Service

## 2016-10-03 NOTE — Assessment & Plan Note (Signed)
With recent spontaneous fib fx, will need further tx, will try to look into copay and tx with prolia

## 2016-10-03 NOTE — Assessment & Plan Note (Signed)
With rib fx pain a significant element, for toradol IM today,  to f/u any worsening symptoms or concerns

## 2016-10-04 ENCOUNTER — Telehealth: Payer: Self-pay | Admitting: Emergency Medicine

## 2016-10-04 DIAGNOSIS — S22000G Wedge compression fracture of unspecified thoracic vertebra, subsequent encounter for fracture with delayed healing: Secondary | ICD-10-CM

## 2016-10-04 DIAGNOSIS — M545 Low back pain: Secondary | ICD-10-CM

## 2016-10-04 NOTE — Telephone Encounter (Signed)
Spoke with pt's spouse (dpr on file), states that Dr. Anselm Pancoast saw multiple rib fractures on imaging- pt is requesting to have a MRI scheduled to rule out compression fracture in back.  Pt's husband is requesting this asap.    RB please advise if ok to order.  Thanks.

## 2016-10-04 NOTE — Telephone Encounter (Signed)
[place] with me to order MRI thoracic and lumbar spine to evaluate for compression fracture. I spoke with her husband, explained that I would like for Dr Jenny Reichmann to manage this going forward, that we were not the best people to be managing her back pain.  Please go ahead and order the MRI. I will forward this message to Dr Jenny Reichmann as an Juluis Rainier only

## 2016-10-04 NOTE — Telephone Encounter (Signed)
Spoke with pt's husband, calling back to check status of MRI approval.  Pt's husband is requesting this ASAP.    RB please advise if ok to order MRI.  Thanks.

## 2016-10-04 NOTE — Telephone Encounter (Signed)
Husband called back stating it is very urgent that he speaks to a nurse. Contact # [phone]...ert

## 2016-10-04 NOTE — Telephone Encounter (Signed)
Husband returned phone call..ert

## 2016-10-04 NOTE — Telephone Encounter (Signed)
Spoke with pt's spouse again, advised yet again that we were awaiting RB's recs for MRI.  I advised pt's spouse that we would call back as soon as we hear back from [place].  RB please advise on status of MRI- pt's spouse has called three times this afternoon regarding it.  Thank you.

## 2016-10-04 NOTE — Addendum Note (Signed)
Addended by: Len Blalock on: 10/04/2016 05:30 PM   Modules accepted: Orders

## 2016-10-04 NOTE — Telephone Encounter (Signed)
MRI ordered.  Pt's spouse aware.  Will close encounter.

## 2016-10-04 NOTE — Telephone Encounter (Signed)
Pt husband calling a/b MRI, please advise.Alexis Higgins

## 2016-10-05 ENCOUNTER — Telehealth: Payer: Self-pay | Admitting: Internal Medicine

## 2016-10-05 ENCOUNTER — Ambulatory Visit (HOSPITAL_COMMUNITY)
Admission: RE | Admit: 2016-10-05 | Discharge: 2016-10-05 | Disposition: A | Discharge status: Home / Self Care | Payer: Medicare Other | Source: Ambulatory Visit | Attending: Emergency Medicine | Admitting: Emergency Medicine

## 2016-10-05 ENCOUNTER — Encounter: Payer: Self-pay | Admitting: Internal Medicine

## 2016-10-05 ENCOUNTER — Other Ambulatory Visit (HOSPITAL_COMMUNITY): Payer: Medicare Other

## 2016-10-05 ENCOUNTER — Inpatient Hospital Stay (HOSPITAL_COMMUNITY)
Admission: EM | Admit: 2016-10-05 | Discharge: 2016-10-09 | DRG: 516 | Disposition: A | Discharge status: Home Health | Payer: Medicare Other | Attending: Internal Medicine | Admitting: Internal Medicine

## 2016-10-05 ENCOUNTER — Other Ambulatory Visit: Payer: Self-pay | Admitting: Internal Medicine

## 2016-10-05 DIAGNOSIS — Z9981 Dependence on supplemental oxygen: Secondary | ICD-10-CM

## 2016-10-05 DIAGNOSIS — M549 Dorsalgia, unspecified: Secondary | ICD-10-CM | POA: Diagnosis not present

## 2016-10-05 DIAGNOSIS — I2510 Atherosclerotic heart disease of native coronary artery without angina pectoris: Secondary | ICD-10-CM | POA: Diagnosis not present

## 2016-10-05 DIAGNOSIS — S22060A Wedge compression fracture of T7-T8 vertebra, initial encounter for closed fracture: Secondary | ICD-10-CM | POA: Diagnosis not present

## 2016-10-05 DIAGNOSIS — Z7952 Long term (current) use of systemic steroids: Secondary | ICD-10-CM

## 2016-10-05 DIAGNOSIS — D72829 Elevated white blood cell count, unspecified: Secondary | ICD-10-CM | POA: Diagnosis not present

## 2016-10-05 DIAGNOSIS — Z86010 Personal history of colonic polyps: Secondary | ICD-10-CM | POA: Diagnosis not present

## 2016-10-05 DIAGNOSIS — J9611 Chronic respiratory failure with hypoxia: Secondary | ICD-10-CM | POA: Diagnosis not present

## 2016-10-05 DIAGNOSIS — E785 Hyperlipidemia, unspecified: Secondary | ICD-10-CM | POA: Diagnosis not present

## 2016-10-05 DIAGNOSIS — Z7902 Long term (current) use of antithrombotics/antiplatelets: Secondary | ICD-10-CM

## 2016-10-05 DIAGNOSIS — Z886 Allergy status to analgesic agent status: Secondary | ICD-10-CM | POA: Diagnosis not present

## 2016-10-05 DIAGNOSIS — G8929 Other chronic pain: Secondary | ICD-10-CM | POA: Diagnosis present

## 2016-10-05 DIAGNOSIS — Z87891 Personal history of nicotine dependence: Secondary | ICD-10-CM

## 2016-10-05 DIAGNOSIS — T380X5A Adverse effect of glucocorticoids and synthetic analogues, initial encounter: Secondary | ICD-10-CM | POA: Diagnosis not present

## 2016-10-05 DIAGNOSIS — S22000G Wedge compression fracture of unspecified thoracic vertebra, subsequent encounter for fracture with delayed healing: Secondary | ICD-10-CM

## 2016-10-05 DIAGNOSIS — M546 Pain in thoracic spine: Secondary | ICD-10-CM | POA: Diagnosis not present

## 2016-10-05 DIAGNOSIS — R0781 Pleurodynia: Secondary | ICD-10-CM | POA: Diagnosis not present

## 2016-10-05 DIAGNOSIS — M545 Low back pain: Secondary | ICD-10-CM | POA: Insufficient documentation

## 2016-10-05 DIAGNOSIS — Z8673 Personal history of transient ischemic attack (TIA), and cerebral infarction without residual deficits: Secondary | ICD-10-CM | POA: Diagnosis not present

## 2016-10-05 DIAGNOSIS — Z79899 Other long term (current) drug therapy: Secondary | ICD-10-CM | POA: Diagnosis not present

## 2016-10-05 DIAGNOSIS — I10 Essential (primary) hypertension: Secondary | ICD-10-CM | POA: Diagnosis present

## 2016-10-05 DIAGNOSIS — Z955 Presence of coronary angioplasty implant and graft: Secondary | ICD-10-CM | POA: Diagnosis not present

## 2016-10-05 DIAGNOSIS — D649 Anemia, unspecified: Secondary | ICD-10-CM | POA: Diagnosis present

## 2016-10-05 DIAGNOSIS — Z8249 Family history of ischemic heart disease and other diseases of the circulatory system: Secondary | ICD-10-CM

## 2016-10-05 DIAGNOSIS — S22069A Unspecified fracture of T7-T8 vertebra, initial encounter for closed fracture: Secondary | ICD-10-CM | POA: Diagnosis present

## 2016-10-05 DIAGNOSIS — M8088XA Other osteoporosis with current pathological fracture, vertebra(e), initial encounter for fracture: Principal | ICD-10-CM | POA: Diagnosis present

## 2016-10-05 DIAGNOSIS — S22000A Wedge compression fracture of unspecified thoracic vertebra, initial encounter for closed fracture: Secondary | ICD-10-CM

## 2016-10-05 DIAGNOSIS — J9610 Chronic respiratory failure, unspecified whether with hypoxia or hypercapnia: Secondary | ICD-10-CM | POA: Diagnosis not present

## 2016-10-05 DIAGNOSIS — S22000D Wedge compression fracture of unspecified thoracic vertebra, subsequent encounter for fracture with routine healing: Secondary | ICD-10-CM

## 2016-10-05 DIAGNOSIS — R0902 Hypoxemia: Secondary | ICD-10-CM | POA: Diagnosis not present

## 2016-10-05 DIAGNOSIS — M4854XA Collapsed vertebra, not elsewhere classified, thoracic region, initial encounter for fracture: Secondary | ICD-10-CM | POA: Diagnosis not present

## 2016-10-05 DIAGNOSIS — S2232XA Fracture of one rib, left side, initial encounter for closed fracture: Secondary | ICD-10-CM | POA: Diagnosis not present

## 2016-10-05 DIAGNOSIS — J449 Chronic obstructive pulmonary disease, unspecified: Secondary | ICD-10-CM | POA: Diagnosis not present

## 2016-10-05 DIAGNOSIS — S12100A Unspecified displaced fracture of second cervical vertebra, initial encounter for closed fracture: Secondary | ICD-10-CM | POA: Diagnosis not present

## 2016-10-05 DIAGNOSIS — X58XXXA Exposure to other specified factors, initial encounter: Secondary | ICD-10-CM | POA: Diagnosis present

## 2016-10-05 DIAGNOSIS — J439 Emphysema, unspecified: Secondary | ICD-10-CM | POA: Diagnosis present

## 2016-10-05 DIAGNOSIS — Z885 Allergy status to narcotic agent status: Secondary | ICD-10-CM | POA: Diagnosis not present

## 2016-10-05 DIAGNOSIS — Z888 Allergy status to other drugs, medicaments and biological substances status: Secondary | ICD-10-CM

## 2016-10-05 DIAGNOSIS — F418 Other specified anxiety disorders: Secondary | ICD-10-CM | POA: Diagnosis present

## 2016-10-05 DIAGNOSIS — F329 Major depressive disorder, single episode, unspecified: Secondary | ICD-10-CM | POA: Diagnosis present

## 2016-10-05 DIAGNOSIS — R0689 Other abnormalities of breathing: Secondary | ICD-10-CM | POA: Diagnosis not present

## 2016-10-05 MED ORDER — ONDANSETRON HCL 4 MG/2ML IJ SOLN
4.0000 mg | Freq: Once | INTRAMUSCULAR | Status: AC
  Administered 2016-10-06: 4 mg via INTRAVENOUS
  Filled 2016-10-05: qty 2

## 2016-10-05 MED ORDER — LORAZEPAM 0.5 MG PO TABS: 0.5000 mg | ORAL_TABLET | Freq: Two times a day (BID) | ORAL | 1 refills | Status: DC | PRN

## 2016-10-05 MED ORDER — HYDROMORPHONE HCL 1 MG/ML IJ SOLN
1.0000 mg | Freq: Once | INTRAMUSCULAR | Status: AC
  Administered 2016-10-06: 1 mg via INTRAVENOUS
  Filled 2016-10-05: qty 1

## 2016-10-05 NOTE — ED Provider Notes (Signed)
[facility] DEPT Provider Note   CSN: [id number] Arrival date & time: 10/05/16  2318  By signing my name below, I, Lise Auer, attest that this documentation has been prepared under the direction and in the presence of Delora Fuel, MD. Electronically Signed: Lise Auer, ED Scribe. 10/06/16. 12:00 AM.   History   Chief Complaint Chief Complaint  Patient presents with  . Back Pain   The history is provided by the patient, the spouse and the EMS personnel. No language interpreter was used.    HPI Comments: Alexis Higgins is a 76 y.o. female with a PMHx of COPD, CAD,TIA, HTN, and osteoporosis,  brought in by ambulance, who presents to the Emergency Department complaining of gradually worsening, lower back pain that has been consistently worsening for three days . Pt also notes having left sided rib pain, which she describes as radiating. She denies any recent traumatic injuries that would have caused the pain. Per nurse notes, pt was diagnosed with a T7 compression fx. Hubsand notes he rubbed Voltaren cream (around 10pm tonight) on the area as well as administering Tylenol and Tramadol with no relief. He notes tried various methods of pain relief with no success. Denies weakness, numbness, tingling, dysuria, urinary frequency, or bowel issues. No other acute associated symptoms noted at this time.  Past Medical History:  Diagnosis Date  . Abnormal chest x-ray    03/2012: will need OP f/u.  Marland Kitchen Anginal pain ([place])   . ANXIETY 01/01/2007  . BURSITIS, RIGHT HIP 06/04/2009  . CAD (coronary artery disease)    a. BMS to LAD 2010. b. NSTEMI with DES to LAD for ISR 2011. c. Patent stent 03/2012/Imdur added.  . Cataract   . CHEST PAIN-PRECORDIAL 01/15/2009  . Colon polyps    H/o tubular adenoma of colon  . COPD 01/01/2007   a. Chronic resp failure on home O2.  Marland Kitchen DEPRESSION 01/01/2007  . Eczema 01/08/2011  . GROIN PAIN 06/20/2008  . Headache(784.0) 01/01/2007  . Hemorrhoid   . HYPERTENSION  01/01/2007  . Impaired glucose tolerance 01/07/2011  . LOW BACK PAIN 01/01/2007  . Muscle weakness (generalized) 06/04/2009  . On home oxygen therapy    "2L; 24/7" (09/10/2016)  . OSTEOARTHRITIS, HIP 07/01/2008  . OSTEOPOROSIS 01/01/2007  . Pneumonia 1998  . Rosacea 01/08/2011  . Shortness of breath   . SYNCOPE 01/01/2007  . Thoracic compression fracture ([place])   . TIA (transient ischemic attack)   . TRANSIENT ISCHEMIC ATTACK, HX OF 01/01/2007    Patient Active Problem List   Diagnosis Date Noted  . Thoracic compression fracture ([place])   . Intractable back pain 09/10/2016  . Chest pain 09/10/2016  . Closed compression fracture of thoracic vertebra ([place])   . 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 Dysphagia Mar 21, 202018  . Peripheral edema 06/15/2016  . Smoker 08/02/2014  . Peripheral vascular disease ([place]) 08/02/2014  . Abdominal pain, LLQ 11/22/2013  . Black stools 11/22/2013  . Heme positive stool 11/22/2013  . Acute blood loss anemia 10/24/2013  . Displaced fracture of left femoral neck ([place]) 09/30/2013  . Fracture of femoral neck, left, closed ([employer]) 09/30/2013  . Fall at home 09/30/2013  . Head contusion 09/30/2013  . Leg weakness, bilateral 07/21/2012  . 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 Unstable angina pectoris ([place]) 04/12/2012  . Chronic respiratory failure ([place]) 02/13/2012  . Localized swelling, mass and lump, neck 01/06/2012  . Dyspnea 11/22/2011  . Personal history of colonic polyps 09/06/2011  .  Eczema 01/08/2011  . Rosacea 01/08/2011  . Dizziness - light-headed 01/08/2011  . Encounter for long-term (current) use of high-risk medication 01/08/2011  . Impaired glucose tolerance 01/07/2011  . Preventative health care 01/07/2011  . BURSITIS, RIGHT HIP 06/04/2009  . CAD, NATIVE VESSEL 01/15/2009  . Other symptoms involving cardiovascular system 01/15/2009  . CHEST PAIN-PRECORDIAL 01/15/2009  . OSTEOARTHRITIS, HIP 07/01/2008  . Hyperlipidemia 01/01/2007    . Anxiety state 01/01/2007  . Depression 01/01/2007  . Essential hypertension 01/01/2007  . COPD (chronic obstructive pulmonary disease) with emphysema ([place]) 01/01/2007  . LOW BACK PAIN 01/01/2007  . Osteoporosis 01/01/2007  . SYNCOPE 01/01/2007  . Headache(784.0) 01/01/2007  . TRANSIENT ISCHEMIC ATTACK, HX OF 01/01/2007    Past Surgical History:  Procedure Laterality Date  . ABDOMINAL HYSTERECTOMY    . APPENDECTOMY    . BACK SURGERY    . BREAST ENHANCEMENT SURGERY    . COLONOSCOPY  01/25/2002   tubular adenoma,hemorrhoids, hyperplastic  colon polyps  . COLONOSCOPY  02/17/2005   hemorrhoids  . CORONARY ANGIOPLASTY    . CORONARY STENT PLACEMENT    . HIP ARTHROPLASTY Left 10/01/2013   Procedure: ARTHROPLASTY UNIPOLAR   HIP;  Surgeon: Marianna Payment, MD;  Location: [facility];  Service: Orthopedics;  Laterality: Left;  . HIP SURGERY Left    DR XU     PROXIMAL NECK   . IR GENERIC HISTORICAL  02/19/2016   IR VERTEBROPLASTY CERV/THOR BX INC UNI/BIL INC/INJECT/IMAGING 02/19/2016 Luanne Bras, MD [facility]  . IR GENERIC HISTORICAL  07/15/2016   IR KYPHO THORACIC WITH BONE BIOPSY 07/15/2016 Luanne Bras, MD [facility]  . IR KYPHO EA ADDL LEVEL THORACIC OR LUMBAR  09/14/2016  . LEFT HEART CATHETERIZATION WITH CORONARY ANGIOGRAM N/A 04/13/2012   Procedure: LEFT HEART CATHETERIZATION WITH CORONARY ANGIOGRAM;  Surgeon: Burnell Blanks, MD;  Location: [facility] CATH LAB;  Service: Cardiovascular;  Laterality: N/A;  . OOPHORECTOMY     one ovary  . s/p bilat cataract  2010  . sp lumbar disc surgury     [name]    OB History    No data available       Home Medications    Prior to Admission medications   Medication Sig Start Date End Date Taking? Authorizing Provider  ascorbic acid (VITAMIN C) 1000 MG tablet Take 1,000 mg by mouth daily.    [provider]  Cholecalciferol (VITAMIN D) 1000 UNITS capsule Take 1,000 Units by mouth every evening.     [provider]  clopidogrel (PLAVIX) 75 MG tablet TAKE 1 TABLET (75 MG TOTAL) BY MOUTH DAILY. 12/11/15   Burnell Blanks, MD  diclofenac sodium (VOLTAREN) 1 % GEL Apply 4 g topically 4 (four) times daily. 09/29/16   Biagio Borg, MD  docusate sodium (COLACE) 100 MG capsule Take 100 mg by mouth daily as needed. For constipation    [provider]  DULoxetine (CYMBALTA) 30 MG capsule Take 1 capsule (30 mg total) by mouth daily. 09/29/16   Biagio Borg, MD  furosemide (LASIX) 20 MG tablet Take 1 tablet (20 mg total) by mouth daily. 09/29/16   Biagio Borg, MD  isosorbide mononitrate (IMDUR) 30 MG 24 hr tablet TAKE 0.5 TABLETS (15 MG TOTAL) BY MOUTH DAILY. 12/11/15   Burnell Blanks, MD  lidocaine (LIDODERM) 5 % Place 1 patch onto the skin daily. Remove & Discard patch within 12 hours or as directed by MD 02/05/16   Long, Vonna Kotyk  G, MD  LORazepam (ATIVAN) 0.5 MG tablet Take 1 tablet (0.5 mg total) by mouth 2 (two) times daily as needed for anxiety. 10/05/16   Biagio Borg, MD  lovastatin (MEVACOR) 20 MG tablet TAKE 1 TABLET BY MOUTH AT BEDTIME 03/10/16   Biagio Borg, MD  metoprolol tartrate (LOPRESSOR) 25 MG tablet TAKE 1/2 TABLET BY MOUTH 2 TIMES A DAY 02/10/16   [provider]  nitroGLYCERIN (NITROSTAT) 0.4 MG SL tablet Place 1 tablet (0.4 mg total) under the tongue every 5 (five) minutes as needed for chest pain (up to 3 doses). 04/13/12   Dunn, Dayna N, PA-C  ondansetron (ZOFRAN) 4 MG tablet Take 1 tablet (4 mg total) by mouth every 8 (eight) hours as needed for nausea or vomiting. Patient taking differently: Take 4 mg by mouth 2 (two) times daily.  01/06/16   Mackuen, Courteney Lyn, MD  polyethylene glycol (MIRALAX / GLYCOLAX) packet Take 17 g by mouth daily as needed for mild constipation.    [provider]  polyvinyl alcohol (LIQUIFILM TEARS) 1.4 % ophthalmic solution Place 1 drop into both eyes daily as needed.     [provider]  predniSONE  (DELTASONE) 10 MG tablet Take 15 mg by mouth daily with breakfast.    [provider]  PROAIR HFA 108 (90 Base) MCG/ACT inhaler INHALE 2 PUFFS INTO THE LUNGS EVERY 6 (SIX) HOURS AS NEEDED FOR WHEEZING OR SHORTNESS OF BREATH. 07/20/16   Biagio Borg, MD  tiotropium (SPIRIVA HANDIHALER) 18 MCG inhalation capsule Place 1 capsule (18 mcg total) into inhaler and inhale daily at 6 (six) AM. 05/26/16   Biagio Borg, MD  traMADol (ULTRAM) 50 MG tablet Take 100 mg by mouth 2 (two) times daily.     [provider]  vitamin B-12 (CYANOCOBALAMIN) 1000 MCG tablet Take 1,000 mcg by mouth daily.    [provider]    Family History Family History  Problem Relation Age of Onset  . Osteoporosis Mother   . Heart disease Sister   . Emphysema Sister   . Seizures Sister        epilepsy  . Cardiomyopathy Sister   . Heart attack Sister     Social History Social History  Substance Use Topics  . Smoking status: Former Smoker    Packs/day: 1.50    Years: 51.00    Types: Cigarettes    Quit date: 07/24/2005  . Smokeless tobacco: Never Used  . Alcohol use No     Allergies   Aspirin; Codeine; and Other   Review of Systems Review of Systems  Genitourinary: Negative for dysuria and frequency.  Musculoskeletal: Positive for back pain.  Neurological: Negative for weakness and numbness.  All other systems reviewed and are negative.  Physical Exam Updated Vital Signs There were no vitals taken for this visit.  Physical Exam  Constitutional: She is oriented to person, place, and time. She appears well-developed and well-nourished.  HENT:  Head: Normocephalic and atraumatic.  Eyes: EOM are normal. Pupils are equal, round, and reactive to light.  Neck: Normal range of motion. Neck supple. No JVD present.  Cardiovascular: Normal rate, regular rhythm and normal heart sounds.   No murmur heard. Pulmonary/Chest: Effort normal and breath sounds normal. She has no wheezes. She has  no rales. She exhibits tenderness.  Moderate-severe tenderness to the left mid, posterior, anterior, and lateral aspect.   Abdominal: Soft. Bowel sounds are normal. She exhibits no distension and no mass. There  is no tenderness.  Musculoskeletal: Normal range of motion. She exhibits tenderness. She exhibits no edema.  Mild tenderness of the lower thoracic spine. No tenderness in the mid-thoracic spine.   Lymphadenopathy:    She has no cervical adenopathy.  Neurological: She is alert and oriented to person, place, and time. No cranial nerve deficit. She exhibits normal muscle tone. Coordination normal.  Skin: Skin is warm and dry. No rash noted.  Psychiatric: She has a normal mood and affect. Her behavior is normal. Judgment and thought content normal.  Nursing note and vitals reviewed.  ED Treatments / Results   DIAGNOSTIC STUDIES: Oxygen Saturation is 98% on 3L [place], normal by my interpretation.   COORDINATION OF CARE: 11:43 PM-Discussed next steps with pt. Pt verbalized understanding and is agreeable with the plan.   Labs (all labs ordered are listed, but only abnormal results are displayed) Labs Reviewed  BASIC METABOLIC PANEL - Abnormal; Notable for the following:       Result Value   Chloride 100 (*)    Calcium 8.6 (*)    All other components within normal limits  CBC WITH DIFFERENTIAL/PLATELET - Abnormal; Notable for the following:    WBC 14.2 (*)    Hemoglobin 11.1 (*)    HCT 35.0 (*)    Neutro Abs 11.1 (*)    Monocytes Absolute 1.3 (*)    All other components within normal limits    Radiology Dg Ribs Unilateral W/chest Left  Result Date: 10/06/2016 CLINICAL DATA:  Left rib pain. No known injury. History of osteoporosis. EXAM: LEFT RIBS AND CHEST - 3+ VIEW COMPARISON:  Chest CT 09/10/2016 FINDINGS: Question nondisplaced fracture of anterior left seventh rib. This is seen only on a single view. No displaced fractures. No evidence of acute pulmonary abnormalities such as  pneumothorax, consolidation or pleural fluid normal heart size and mediastinal contours. Vertebroplasty within multiple thoracic spine compression fractures. Peripherally calcified bilateral breast implants. IMPRESSION: Possible nondisplaced anterior left seventh rib fracture. No pulmonary complication. Electronically Signed   By: Jeb Levering M.D.   On: 10/06/2016 01:36   Mr Thoracic Spine Wo Contrast  Result Date: 10/05/2016 CLINICAL DATA:  76 year old female with severe thoracolumbar back pain radiating to the left side for 1 week. Previously augmented multilevel spinal compression fractures. EXAM: MRI THORACIC AND LUMBAR SPINE WITHOUT CONTRAST TECHNIQUE: Multiplanar and multiecho pulse sequences of the thoracic and lumbar spine were obtained without intravenous contrast. COMPARISON:  Images from T11 kyphoplasty 09/14/2016. Thoracic spine MRI 09/07/2016, and earlier. Lumbar MRI 05/21/2009. FINDINGS: MRI THORACIC SPINE FINDINGS Limited sagittal imaging of the cervical spine: Stable compared to 09/07/2016. Thoracic segmentation:  Normal. Alignment:  Stable. Vertebrae: Interval augmentation of the T11 mild compression fracture with regressed marrow edema at that level. Chronic previously augmented T5 and T10 compression fractures. Chronic T6 compression fracture. New mild T7 superior endplate compression fracture with mild to moderate vertebral body marrow edema. Slight retropulsion of the posterosuperior endplate. 35% loss of T7 vertebral body height. No associated spinal or foraminal stenosis. T7 posterior elements appear intact. Chronic right T9 pedicle STIR hyperintensity is unchanged and felt related to benign hemangioma. No other acute osseous abnormality identified. Cord: Remains normal. Capacious thoracic spinal canal. Normal conus medullaris at T12-L1. Paraspinal and other soft tissues: Negative visualized thoracic and upper abdominal viscera. Mild posterior thoracic subcutaneous edema. Disc levels:  Fairly capacious thoracic spinal canal. Borderline to mild multifactorial spinal stenosis at T10-T11 is stable. MRI LUMBAR SPINE FINDINGS Segmentation:  Normal. Alignment:  Stable since 2010. Vertebrae: No marrow edema or evidence of acute osseous abnormality. Visualized bone marrow signal is within normal limits. Visible sacrum and SI joints are intact. Conus medullaris: Extends to the T12-L1 level and appears normal. Paraspinal and other soft tissues: Stable and negative. Disc levels: Stable since 2010. Capacious lumbar spinal canal. Chronic postoperative changes to the right lamina at L4-L5. No lumbar spinal stenosis or significant foraminal stenosis. IMPRESSION: MR THORACIC SPINE IMPRESSION 1. Acute to subacute T7 compression fracture with marrow edema and 35% loss of vertebral body height. Minimal retropulsion of the posterosuperior endplate. No associated spinal stenosis or other complicating features. 2. Interval T11 vertebral augmentation. Chronic T5, T6, and T10 compression fractures. MR LUMBAR SPINE IMPRESSION No acute findings. Stable MRI appearance of the lumbar spine since 2010. Electronically Signed   By: Genevie Ann M.D.   On: 10/05/2016 09:16   Mr Lumbar Spine Wo Contrast  Result Date: 10/05/2016 CLINICAL DATA:  76 year old female with severe thoracolumbar back pain radiating to the left side for 1 week. Previously augmented multilevel spinal compression fractures. EXAM: MRI THORACIC AND LUMBAR SPINE WITHOUT CONTRAST TECHNIQUE: Multiplanar and multiecho pulse sequences of the thoracic and lumbar spine were obtained without intravenous contrast. COMPARISON:  Images from T11 kyphoplasty 09/14/2016. Thoracic spine MRI 09/07/2016, and earlier. Lumbar MRI 05/21/2009. FINDINGS: MRI THORACIC SPINE FINDINGS Limited sagittal imaging of the cervical spine: Stable compared to 09/07/2016. Thoracic segmentation:  Normal. Alignment:  Stable. Vertebrae: Interval augmentation of the T11 mild compression fracture  with regressed marrow edema at that level. Chronic previously augmented T5 and T10 compression fractures. Chronic T6 compression fracture. New mild T7 superior endplate compression fracture with mild to moderate vertebral body marrow edema. Slight retropulsion of the posterosuperior endplate. 35% loss of T7 vertebral body height. No associated spinal or foraminal stenosis. T7 posterior elements appear intact. Chronic right T9 pedicle STIR hyperintensity is unchanged and felt related to benign hemangioma. No other acute osseous abnormality identified. Cord: Remains normal. Capacious thoracic spinal canal. Normal conus medullaris at T12-L1. Paraspinal and other soft tissues: Negative visualized thoracic and upper abdominal viscera. Mild posterior thoracic subcutaneous edema. Disc levels: Fairly capacious thoracic spinal canal. Borderline to mild multifactorial spinal stenosis at T10-T11 is stable. MRI LUMBAR SPINE FINDINGS Segmentation:  Normal. Alignment:  Stable since 2010. Vertebrae: No marrow edema or evidence of acute osseous abnormality. Visualized bone marrow signal is within normal limits. Visible sacrum and SI joints are intact. Conus medullaris: Extends to the T12-L1 level and appears normal. Paraspinal and other soft tissues: Stable and negative. Disc levels: Stable since 2010. Capacious lumbar spinal canal. Chronic postoperative changes to the right lamina at L4-L5. No lumbar spinal stenosis or significant foraminal stenosis. IMPRESSION: MR THORACIC SPINE IMPRESSION 1. Acute to subacute T7 compression fracture with marrow edema and 35% loss of vertebral body height. Minimal retropulsion of the posterosuperior endplate. No associated spinal stenosis or other complicating features. 2. Interval T11 vertebral augmentation. Chronic T5, T6, and T10 compression fractures. MR LUMBAR SPINE IMPRESSION No acute findings. Stable MRI appearance of the lumbar spine since 2010. Electronically Signed   By: Genevie Ann M.D.    On: 10/05/2016 09:16    Procedures Procedures (including critical care time)  Medications Ordered in ED Medications  HYDROmorphone (DILAUDID) injection 1 mg (1 mg Intravenous Given 10/06/16 0017)  ondansetron (ZOFRAN) injection 4 mg (4 mg Intravenous Given 10/06/16 0012)  HYDROmorphone (DILAUDID) injection 1 mg (1 mg Intravenous Given 10/06/16 0215)  HYDROmorphone (DILAUDID) injection 1 mg (  1 mg Intravenous Given 10/06/16 0325)     Initial Impression / Assessment and Plan / ED Course  I have reviewed the triage vital signs and the nursing notes.  Pertinent labs & imaging results that were available during my care of the patient were reviewed by me and considered in my medical decision making (see chart for details).  Left-sided rib cage pain which has not been adequately controlled at home. MRI today showed new compression fracture at T7. Review of old records shows recent hospitalization for compression fracture T11 treated with kyphoplasty. She is not tender over the region of her new compression fracture, but is tender over the rib cage. She will be sent for rib detail x-rays. She is given hydromorphone for pain.  She did not get significant relief with initial dose of hydromorphone. She is given a second dose with only modest improvement. She's given a third visit hydromorphone. At this point, she is resting comfortably. However, when she attempted to transfer to chair, pain was increased and she was much more unsteady than she is at baseline. Husband is very concerned about not being able to take care of her at home. Case is discussed with Dr. Blaine Hamper of [facility] who agrees to admit her under observation status.  Final Clinical Impressions(s) / ED Diagnoses   Final diagnoses:  Closed fracture of one rib of left side, initial encounter  Non-traumatic compression fracture of seventh thoracic vertebra, initial encounter (HCC)  Normochromic normocytic anemia    New  Prescriptions New Prescriptions   No medications on file   I personally performed the services described in this documentation, which was scribed in my presence. The recorded information has been reviewed and is accurate.       Delora Fuel, MD 74/08/14 [phone]

## 2016-10-05 NOTE — Telephone Encounter (Signed)
Cymbalta full effect can take 3-4 wks, and remember this is not a narcotic, so it is not that strong  Ok for ativan prn anxiety - Done hardcopy to [facility]  Lumbar MRI shows no change from 2010  Thoracic MRI shows recent (but not sure when) fracture to T7 ; T11 has been s/p kyphoplasty 4/24  - no change there. There are other old fx to thoracic spine t5, t6, and t10  I can refer back to I believe it was [facility] or they can call themselves if they think they do not need another referral for insurance purpose, as kyphoplasty T7 may be indicated  Ok for rx for [facility] - Done hardcopy to [employer]

## 2016-10-05 NOTE — Telephone Encounter (Signed)
Spouse has been informed and expressed understanding.

## 2016-10-05 NOTE — ED Triage Notes (Signed)
Pt from home with c/o lower back pain and LUQ abdominal pain. Pt having back pain x several months, diagnosed with T7 compression fx today. Took tramadol at 2200 tonight, states pain has not improved. PTAR vitals: BP-140/82, P-110

## 2016-10-05 NOTE — Telephone Encounter (Signed)
Pt husband called in and said that pt will be out of her voltran gel tomorrow and husband said that she would like refill asap

## 2016-10-05 NOTE — Telephone Encounter (Signed)
Spouse has called in stating patient has been on Cymbalta for 1 week.  Would like to know how long it takes for medication to start showing signs of working.  States that patient has gotten worse over the week.  States that patient has really bad anxiety when going to the bathroom.  States that she is still having bad body pain as well.  States just had MRI at [facility] to check on fractures.   Is requesting a call back in regard.  Is also requesting a script for a bed side potty chair.  Spouse also states something about since Dr. Jenny Reichmann does not work on Mondays Dr. Lamonte Sakai has been ordering MRI's for patient?  Not sure if Mondays for patient are better days to be seen.

## 2016-10-06 ENCOUNTER — Encounter (HOSPITAL_COMMUNITY): Payer: Self-pay | Admitting: *Deleted

## 2016-10-06 ENCOUNTER — Other Ambulatory Visit: Payer: Medicare Other

## 2016-10-06 ENCOUNTER — Telehealth: Payer: Self-pay

## 2016-10-06 ENCOUNTER — Emergency Department (HOSPITAL_COMMUNITY): Payer: Medicare Other

## 2016-10-06 DIAGNOSIS — D649 Anemia, unspecified: Secondary | ICD-10-CM | POA: Diagnosis not present

## 2016-10-06 DIAGNOSIS — Z8249 Family history of ischemic heart disease and other diseases of the circulatory system: Secondary | ICD-10-CM | POA: Diagnosis not present

## 2016-10-06 DIAGNOSIS — M546 Pain in thoracic spine: Secondary | ICD-10-CM | POA: Diagnosis not present

## 2016-10-06 DIAGNOSIS — Z8673 Personal history of transient ischemic attack (TIA), and cerebral infarction without residual deficits: Secondary | ICD-10-CM | POA: Diagnosis not present

## 2016-10-06 DIAGNOSIS — I10 Essential (primary) hypertension: Secondary | ICD-10-CM | POA: Diagnosis not present

## 2016-10-06 DIAGNOSIS — Z86010 Personal history of colonic polyps: Secondary | ICD-10-CM | POA: Diagnosis not present

## 2016-10-06 DIAGNOSIS — X58XXXA Exposure to other specified factors, initial encounter: Secondary | ICD-10-CM | POA: Diagnosis present

## 2016-10-06 DIAGNOSIS — Z79899 Other long term (current) drug therapy: Secondary | ICD-10-CM | POA: Diagnosis not present

## 2016-10-06 DIAGNOSIS — M8088XA Other osteoporosis with current pathological fracture, vertebra(e), initial encounter for fracture: Secondary | ICD-10-CM | POA: Diagnosis not present

## 2016-10-06 DIAGNOSIS — S22000G Wedge compression fracture of unspecified thoracic vertebra, subsequent encounter for fracture with delayed healing: Secondary | ICD-10-CM | POA: Diagnosis not present

## 2016-10-06 DIAGNOSIS — S2232XA Fracture of one rib, left side, initial encounter for closed fracture: Secondary | ICD-10-CM | POA: Diagnosis not present

## 2016-10-06 DIAGNOSIS — Z886 Allergy status to analgesic agent status: Secondary | ICD-10-CM | POA: Diagnosis not present

## 2016-10-06 DIAGNOSIS — J9611 Chronic respiratory failure with hypoxia: Secondary | ICD-10-CM

## 2016-10-06 DIAGNOSIS — Z885 Allergy status to narcotic agent status: Secondary | ICD-10-CM | POA: Diagnosis not present

## 2016-10-06 DIAGNOSIS — R0781 Pleurodynia: Secondary | ICD-10-CM | POA: Diagnosis not present

## 2016-10-06 DIAGNOSIS — S22069A Unspecified fracture of T7-T8 vertebra, initial encounter for closed fracture: Secondary | ICD-10-CM | POA: Diagnosis present

## 2016-10-06 DIAGNOSIS — Z9981 Dependence on supplemental oxygen: Secondary | ICD-10-CM | POA: Diagnosis not present

## 2016-10-06 DIAGNOSIS — E785 Hyperlipidemia, unspecified: Secondary | ICD-10-CM | POA: Diagnosis not present

## 2016-10-06 DIAGNOSIS — J9610 Chronic respiratory failure, unspecified whether with hypoxia or hypercapnia: Secondary | ICD-10-CM

## 2016-10-06 DIAGNOSIS — Z7902 Long term (current) use of antithrombotics/antiplatelets: Secondary | ICD-10-CM | POA: Diagnosis not present

## 2016-10-06 DIAGNOSIS — J439 Emphysema, unspecified: Secondary | ICD-10-CM | POA: Diagnosis not present

## 2016-10-06 DIAGNOSIS — G8929 Other chronic pain: Secondary | ICD-10-CM | POA: Diagnosis present

## 2016-10-06 DIAGNOSIS — D72829 Elevated white blood cell count, unspecified: Secondary | ICD-10-CM | POA: Diagnosis present

## 2016-10-06 DIAGNOSIS — M549 Dorsalgia, unspecified: Secondary | ICD-10-CM | POA: Diagnosis not present

## 2016-10-06 DIAGNOSIS — M4854XA Collapsed vertebra, not elsewhere classified, thoracic region, initial encounter for fracture: Secondary | ICD-10-CM | POA: Diagnosis not present

## 2016-10-06 DIAGNOSIS — Z7952 Long term (current) use of systemic steroids: Secondary | ICD-10-CM | POA: Diagnosis not present

## 2016-10-06 DIAGNOSIS — M545 Low back pain: Secondary | ICD-10-CM | POA: Diagnosis not present

## 2016-10-06 DIAGNOSIS — F329 Major depressive disorder, single episode, unspecified: Secondary | ICD-10-CM | POA: Diagnosis present

## 2016-10-06 DIAGNOSIS — Z87891 Personal history of nicotine dependence: Secondary | ICD-10-CM | POA: Diagnosis not present

## 2016-10-06 DIAGNOSIS — I2510 Atherosclerotic heart disease of native coronary artery without angina pectoris: Secondary | ICD-10-CM | POA: Diagnosis present

## 2016-10-06 DIAGNOSIS — Z955 Presence of coronary angioplasty implant and graft: Secondary | ICD-10-CM | POA: Diagnosis not present

## 2016-10-06 DIAGNOSIS — T380X5A Adverse effect of glucocorticoids and synthetic analogues, initial encounter: Secondary | ICD-10-CM | POA: Diagnosis present

## 2016-10-06 LAB — CBC WITH DIFFERENTIAL/PLATELET
Basophils Absolute: 0 10*3/uL (ref 0.0–0.1)
Basophils Relative: 0 %
EOS ABS: 0 10*3/uL (ref 0.0–0.7)
Eosinophils Relative: 0 %
HEMATOCRIT: 35 % — AB (ref 36.0–46.0)
HEMOGLOBIN: 11.1 g/dL — AB (ref 12.0–15.0)
LYMPHS ABS: 1.7 10*3/uL (ref 0.7–4.0)
Lymphocytes Relative: 12 %
MCH: 28.6 pg (ref 26.0–34.0)
MCHC: 31.7 g/dL (ref 30.0–36.0)
MCV: 90.2 fL (ref 78.0–100.0)
MONO ABS: 1.3 10*3/uL — AB (ref 0.1–1.0)
MONOS PCT: 9 %
NEUTROS PCT: 79 %
Neutro Abs: 11.1 10*3/uL — ABNORMAL HIGH (ref 1.7–7.7)
Platelets: 214 10*3/uL (ref 150–400)
RBC: 3.88 MIL/uL (ref 3.87–5.11)
RDW: 13.7 % (ref 11.5–15.5)
WBC: 14.2 10*3/uL — ABNORMAL HIGH (ref 4.0–10.5)

## 2016-10-06 LAB — BASIC METABOLIC PANEL
Anion gap: 10 (ref 5–15)
BUN: 12 mg/dL (ref 6–20)
CO2: 30 mmol/L (ref 22–32)
CREATININE: 0.81 mg/dL (ref 0.44–1.00)
Calcium: 8.6 mg/dL — ABNORMAL LOW (ref 8.9–10.3)
Chloride: 100 mmol/L — ABNORMAL LOW (ref 101–111)
GFR calc Af Amer: >60 mL/min (ref >60)
GFR calc non Af Amer: >60 mL/min (ref >60)
GLUCOSE: 79 mg/dL (ref 65–99)
Potassium: 3.5 mmol/L (ref 3.5–5.1)
SODIUM: 140 mmol/L (ref 135–145)

## 2016-10-06 LAB — PROTIME-INR
INR: 0.8
Prothrombin Time: 11 seconds — ABNORMAL LOW (ref 11.4–15.2)

## 2016-10-06 LAB — APTT: aPTT: 24 seconds (ref 24–36)

## 2016-10-06 MED ORDER — HYDROMORPHONE HCL 1 MG/ML IJ SOLN
1.0000 mg | Freq: Once | INTRAMUSCULAR | Status: AC
  Administered 2016-10-06: 1 mg via INTRAVENOUS
  Filled 2016-10-06: qty 1

## 2016-10-06 MED ORDER — ACETAMINOPHEN 325 MG PO TABS: 650.0000 mg | ORAL_TABLET | Freq: Four times a day (QID) | ORAL | Status: DC | PRN

## 2016-10-06 MED ORDER — PREDNISONE 5 MG PO TABS
15.0000 mg | ORAL_TABLET | Freq: Every day | ORAL | Status: DC
  Administered 2016-10-06 – 2016-10-09 (×3): 15 mg via ORAL
  Filled 2016-10-06 (×4): qty 1

## 2016-10-06 MED ORDER — METOPROLOL TARTRATE 25 MG PO TABS
12.5000 mg | ORAL_TABLET | Freq: Two times a day (BID) | ORAL | Status: DC
  Administered 2016-10-06 – 2016-10-09 (×6): 12.5 mg via ORAL
  Filled 2016-10-06 (×6): qty 1

## 2016-10-06 MED ORDER — HYDROMORPHONE HCL 1 MG/ML IJ SOLN
0.5000 mg | INTRAMUSCULAR | Status: DC | PRN
  Administered 2016-10-06 – 2016-10-07 (×6): 0.5 mg via INTRAVENOUS
  Filled 2016-10-06 (×6): qty 1

## 2016-10-06 MED ORDER — OXYCODONE-ACETAMINOPHEN 5-325 MG PO TABS
1.0000 | ORAL_TABLET | ORAL | Status: DC | PRN
  Administered 2016-10-06 – 2016-10-09 (×12): 1 via ORAL
  Filled 2016-10-06 (×13): qty 1

## 2016-10-06 MED ORDER — DICLOFENAC SODIUM 1 % TD GEL
2.0000 g | Freq: Four times a day (QID) | TRANSDERMAL | Status: DC | PRN
  Administered 2016-10-07: 19:00:00 2 g via TOPICAL
  Filled 2016-10-06: qty 100

## 2016-10-06 MED ORDER — ACETAMINOPHEN 650 MG RE SUPP: 650.0000 mg | Freq: Four times a day (QID) | RECTAL | Status: DC | PRN

## 2016-10-06 MED ORDER — ENOXAPARIN SODIUM 40 MG/0.4ML ~~LOC~~ SOLN
40.0000 mg | Freq: Every day | SUBCUTANEOUS | Status: AC
  Administered 2016-10-06 – 2016-10-07 (×2): 40 mg via SUBCUTANEOUS
  Filled 2016-10-06 (×2): qty 0.4

## 2016-10-06 MED ORDER — ONDANSETRON HCL 4 MG/2ML IJ SOLN
4.0000 mg | INTRAMUSCULAR | Status: DC | PRN
  Administered 2016-10-06 – 2016-10-09 (×12): 4 mg via INTRAVENOUS
  Filled 2016-10-06 (×12): qty 2

## 2016-10-06 MED ORDER — METHOCARBAMOL 500 MG PO TABS
500.0000 mg | ORAL_TABLET | Freq: Three times a day (TID) | ORAL | Status: DC | PRN
  Administered 2016-10-07 – 2016-10-08 (×3): 500 mg via ORAL
  Filled 2016-10-06 (×3): qty 1

## 2016-10-06 MED ORDER — CLOPIDOGREL BISULFATE 75 MG PO TABS
75.0000 mg | ORAL_TABLET | Freq: Every day | ORAL | Status: DC
  Filled 2016-10-06: qty 1

## 2016-10-06 MED ORDER — VITAMIN B-12 1000 MCG PO TABS
1000.0000 ug | ORAL_TABLET | Freq: Every day | ORAL | Status: DC
  Administered 2016-10-06 – 2016-10-07 (×2): 1000 ug via ORAL
  Filled 2016-10-06 (×4): qty 1

## 2016-10-06 MED ORDER — LIDOCAINE 5 % EX PTCH
1.0000 | MEDICATED_PATCH | CUTANEOUS | Status: DC
  Administered 2016-10-07 – 2016-10-09 (×3): 1 via TRANSDERMAL
  Filled 2016-10-06 (×4): qty 1

## 2016-10-06 MED ORDER — PRAVASTATIN SODIUM 40 MG PO TABS
20.0000 mg | ORAL_TABLET | Freq: Every day | ORAL | Status: DC
  Administered 2016-10-06 – 2016-10-08 (×3): 20 mg via ORAL
  Filled 2016-10-06 (×3): qty 1

## 2016-10-06 MED ORDER — VITAMIN C 500 MG PO TABS
1000.0000 mg | ORAL_TABLET | Freq: Every day | ORAL | Status: DC
  Administered 2016-10-06 – 2016-10-07 (×2): 1000 mg via ORAL
  Filled 2016-10-06 (×4): qty 2

## 2016-10-06 MED ORDER — ZOLPIDEM TARTRATE 5 MG PO TABS: 5.0000 mg | ORAL_TABLET | Freq: Every evening | ORAL | Status: DC | PRN

## 2016-10-06 MED ORDER — ISOSORBIDE MONONITRATE ER 30 MG PO TB24
15.0000 mg | ORAL_TABLET | Freq: Every day | ORAL | Status: DC
  Administered 2016-10-06 – 2016-10-09 (×3): 15 mg via ORAL
  Filled 2016-10-06 (×3): qty 1

## 2016-10-06 MED ORDER — POLYETHYLENE GLYCOL 3350 17 G PO PACK: 17.0000 g | PACK | Freq: Every day | ORAL | Status: DC | PRN

## 2016-10-06 MED ORDER — DULOXETINE HCL 30 MG PO CPEP
30.0000 mg | ORAL_CAPSULE | Freq: Every day | ORAL | Status: DC
  Administered 2016-10-06 – 2016-10-09 (×3): 30 mg via ORAL
  Filled 2016-10-06 (×3): qty 1

## 2016-10-06 MED ORDER — ENOXAPARIN SODIUM 40 MG/0.4ML ~~LOC~~ SOLN
40.0000 mg | SUBCUTANEOUS | Status: DC
  Administered 2016-10-09: 40 mg via SUBCUTANEOUS
  Filled 2016-10-06: qty 0.4

## 2016-10-06 MED ORDER — POLYVINYL ALCOHOL 1.4 % OP SOLN: 1.0000 [drp] | Freq: Every day | OPHTHALMIC | Status: DC | PRN

## 2016-10-06 MED ORDER — NITROGLYCERIN 0.4 MG SL SUBL: 0.4000 mg | SUBLINGUAL_TABLET | SUBLINGUAL | Status: DC | PRN

## 2016-10-06 MED ORDER — ONDANSETRON HCL 4 MG/2ML IJ SOLN
4.0000 mg | Freq: Three times a day (TID) | INTRAMUSCULAR | Status: DC | PRN
  Administered 2016-10-06: 4 mg via INTRAVENOUS
  Filled 2016-10-06: qty 2

## 2016-10-06 MED ORDER — ALBUTEROL SULFATE (2.5 MG/3ML) 0.083% IN NEBU: 2.5000 mg | INHALATION_SOLUTION | Freq: Four times a day (QID) | RESPIRATORY_TRACT | Status: DC | PRN

## 2016-10-06 MED ORDER — VITAMIN D 1000 UNITS PO TABS
1000.0000 [IU] | ORAL_TABLET | Freq: Every evening | ORAL | Status: DC
  Administered 2016-10-06 – 2016-10-08 (×3): 1000 [IU] via ORAL
  Filled 2016-10-06 (×3): qty 1

## 2016-10-06 MED ORDER — TIOTROPIUM BROMIDE MONOHYDRATE 18 MCG IN CAPS
18.0000 ug | ORAL_CAPSULE | Freq: Every day | RESPIRATORY_TRACT | Status: DC
  Filled 2016-10-06: qty 5

## 2016-10-06 MED ORDER — TIOTROPIUM BROMIDE MONOHYDRATE 18 MCG IN CAPS
18.0000 ug | ORAL_CAPSULE | Freq: Every day | RESPIRATORY_TRACT | Status: DC
  Administered 2016-10-07 – 2016-10-09 (×3): 18 ug via RESPIRATORY_TRACT
  Filled 2016-10-06: qty 5

## 2016-10-06 MED ORDER — FUROSEMIDE 20 MG PO TABS
20.0000 mg | ORAL_TABLET | Freq: Every day | ORAL | Status: DC
  Filled 2016-10-06: qty 1

## 2016-10-06 MED ORDER — DOCUSATE SODIUM 100 MG PO CAPS: 100.0000 mg | ORAL_CAPSULE | Freq: Every day | ORAL | Status: DC | PRN

## 2016-10-06 NOTE — Consult Note (Signed)
Chief Complaint: Patient was seen in consultation today for Thoracic 7 vertebroplasty Chief Complaint  Patient presents with  . Back Pain   at the request of Dr Fanny Bien  Referring Physician(s): Dr Fanny Bien  Supervising Physician: Luanne Bras  Patient Status: [facility] - In-pt  History of Present Illness: Alexis Higgins is a 76 y.o. female   Known to NIR Kyphoplasty T5 01/2016; T10 06/2016; T11 08/2016 Now with new pain and new Thoracic 7 fracture Long Hx COPD- chronic Prednisone at home Developed back pain almost a week ago Denies injury Intractable pain--admitted for pain control this am MRI 10/05/16: IMPRESSION: MR THORACIC SPINE IMPRESSION 1. Acute to subacute T7 compression fracture with marrow edema and 35% loss of vertebral body height. Minimal retropulsion of the posterosuperior endplate. No associated spinal stenosis or other complicating features. 2. Interval T11 vertebral augmentation. Chronic T5, T6, and T10 compression fractures.  Request for vertebroplasty per [facility] Dr Estanislado Pandy has reviewed imaging and approves procedure Last dose Plavix Sunday 5/13---need off 5 days to safely perform vertebroplasty Lovenox daily while in [facility] is being pre certified now---hope to move ahead with procedure Friday  Past Medical History:  Diagnosis Date  . Abnormal chest x-ray    03/2012: will need OP f/u.  Marland Kitchen Anginal pain ([place])   . ANXIETY 01/01/2007  . BURSITIS, RIGHT HIP 06/04/2009  . CAD (coronary artery disease)    a. BMS to LAD 2010. b. NSTEMI with DES to LAD for ISR 2011. c. Patent stent 03/2012/Imdur added.  . Cataract   . CHEST PAIN-PRECORDIAL 01/15/2009  . Colon polyps    H/o tubular adenoma of colon  . COPD 01/01/2007   a. Chronic resp failure on home O2.  Marland Kitchen DEPRESSION 01/01/2007  . Eczema 01/08/2011  . GROIN PAIN 06/20/2008  . Headache(784.0) 01/01/2007  . Hemorrhoid   . HYPERTENSION 01/01/2007  . Impaired glucose tolerance 01/07/2011  . LOW  BACK PAIN 01/01/2007  . Muscle weakness (generalized) 06/04/2009  . On home oxygen therapy    "2L; 24/7" (09/10/2016)  . OSTEOARTHRITIS, HIP 07/01/2008  . OSTEOPOROSIS 01/01/2007  . Pneumonia 1998  . Rosacea 01/08/2011  . Shortness of breath   . SYNCOPE 01/01/2007  . Thoracic compression fracture ([place])   . TIA (transient ischemic attack)   . TRANSIENT ISCHEMIC ATTACK, HX OF 01/01/2007    Past Surgical History:  Procedure Laterality Date  . ABDOMINAL HYSTERECTOMY    . APPENDECTOMY    . BACK SURGERY    . BREAST ENHANCEMENT SURGERY    . COLONOSCOPY  01/25/2002   tubular adenoma,hemorrhoids, hyperplastic  colon polyps  . COLONOSCOPY  02/17/2005   hemorrhoids  . CORONARY ANGIOPLASTY    . CORONARY STENT PLACEMENT    . HIP ARTHROPLASTY Left 10/01/2013   Procedure: ARTHROPLASTY UNIPOLAR   HIP;  Surgeon: Marianna Payment, MD;  Location: [place];  Service: Orthopedics;  Laterality: Left;  . HIP SURGERY Left    DR XU     PROXIMAL NECK   . IR GENERIC HISTORICAL  02/19/2016   IR VERTEBROPLASTY CERV/THOR BX INC UNI/BIL INC/INJECT/IMAGING 02/19/2016 Luanne Bras, MD [facility]INTER[facility]  . IR GENERIC HISTORICAL  07/15/2016   IR KYPHO THORACIC WITH BONE BIOPSY 07/15/2016 Luanne Bras, MD [facility]  . IR KYPHO EA ADDL LEVEL THORACIC OR LUMBAR  09/14/2016  . LEFT HEART CATHETERIZATION WITH CORONARY ANGIOGRAM N/A 04/13/2012   Procedure: LEFT HEART CATHETERIZATION WITH CORONARY ANGIOGRAM;  Surgeon: Burnell Blanks, MD;  Location:  [place] CATH LAB;  Service: Cardiovascular;  Laterality: N/A;  . OOPHORECTOMY     one ovary  . s/p bilat cataract  2010  . sp lumbar disc surgury     [name]    Allergies: Aspirin; Codeine; and Other  Medications: Prior to Admission medications   Medication Sig Start Date End Date Taking? Authorizing Provider  ascorbic acid (VITAMIN C) 1000 MG tablet Take 1,000 mg by mouth daily.   Yes [provider]  Cholecalciferol (VITAMIN D) 1000 UNITS capsule  Take 1,000 Units by mouth every evening.    Yes [provider]  diclofenac sodium (VOLTAREN) 1 % GEL Apply 4 g topically 4 (four) times daily. 09/29/16  Yes Biagio Borg, MD  docusate sodium (COLACE) 100 MG capsule Take 100 mg by mouth daily as needed. For constipation   Yes [provider]  DULoxetine (CYMBALTA) 30 MG capsule Take 1 capsule (30 mg total) by mouth daily. 09/29/16  Yes Biagio Borg, MD  furosemide (LASIX) 20 MG tablet Take 1 tablet (20 mg total) by mouth daily. 09/29/16  Yes Biagio Borg, MD  isosorbide mononitrate (IMDUR) 30 MG 24 hr tablet TAKE 0.5 TABLETS (15 MG TOTAL) BY MOUTH DAILY. 12/11/15  Yes Burnell Blanks, MD  lidocaine (LIDODERM) 5 % Place 1 patch onto the skin daily. Remove & Discard patch within 12 hours or as directed by MD 02/05/16  Yes Long, Wonda Olds, MD  lovastatin (MEVACOR) 20 MG tablet TAKE 1 TABLET BY MOUTH AT BEDTIME 03/10/16  Yes Biagio Borg, MD  metoprolol tartrate (LOPRESSOR) 25 MG tablet TAKE 1/2 TABLET BY MOUTH 2 TIMES A DAY 02/10/16  Yes [provider]  nitroGLYCERIN (NITROSTAT) 0.4 MG SL tablet Place 1 tablet (0.4 mg total) under the tongue every 5 (five) minutes as needed for chest pain (up to 3 doses). 04/13/12  Yes Dunn, Dayna N, PA-C  ondansetron (ZOFRAN) 4 MG tablet Take 1 tablet (4 mg total) by mouth every 8 (eight) hours as needed for nausea or vomiting. Patient taking differently: Take 4 mg by mouth 2 (two) times daily.  01/06/16  Yes Mackuen, Courteney Lyn, MD  polyethylene glycol (MIRALAX / GLYCOLAX) packet Take 17 g by mouth daily as needed for mild constipation.   Yes [provider]  polyvinyl alcohol (LIQUIFILM TEARS) 1.4 % ophthalmic solution Place 1 drop into both eyes daily as needed for dry eyes.    Yes [provider]  predniSONE (DELTASONE) 10 MG tablet Take 15 mg by mouth daily with breakfast.   Yes [provider]  PROAIR HFA 108 (90 Base) MCG/ACT inhaler INHALE 2 PUFFS INTO  THE LUNGS EVERY 6 (SIX) HOURS AS NEEDED FOR WHEEZING OR SHORTNESS OF BREATH. 07/20/16  Yes Biagio Borg, MD  tiotropium (SPIRIVA HANDIHALER) 18 MCG inhalation capsule Place 1 capsule (18 mcg total) into inhaler and inhale daily at 6 (six) AM. 05/26/16  Yes Biagio Borg, MD  traMADol (ULTRAM) 50 MG tablet Take 100 mg by mouth 2 (two) times daily.    Yes [provider]  vitamin B-12 (CYANOCOBALAMIN) 1000 MCG tablet Take 1,000 mcg by mouth daily.   Yes [provider]  clopidogrel (PLAVIX) 75 MG tablet TAKE 1 TABLET (75 MG TOTAL) BY MOUTH DAILY. 12/11/15   Burnell Blanks, MD  LORazepam (ATIVAN) 0.5 MG tablet Take 1 tablet (0.5 mg total) by mouth 2 (two) times daily as needed for anxiety. Patient not taking: Reported on 10/06/2016 10/05/16  Biagio Borg, MD     Family History  Problem Relation Age of Onset  . Osteoporosis Mother   . Heart disease Sister   . Emphysema Sister   . Seizures Sister        epilepsy  . Cardiomyopathy Sister   . Heart attack Sister     Social History   Social History  . Marital status: Married    Spouse name: N/A  . Number of children: 4  . Years of education: N/A   Occupational History  . disabled back since 2003    Social History Main Topics  . Smoking status: Former Smoker    Packs/day: 1.50    Years: 51.00    Types: Cigarettes    Quit date: 07/24/2005  . Smokeless tobacco: Never Used  . Alcohol use No  . Drug use: No  . Sexual activity: Not Asked   Other Topics Concern  . None   Social History Narrative  . None    Review of Systems: A 12 point ROS discussed and pertinent positives are indicated in the HPI above.  All other systems are negative.  Review of Systems  Constitutional: Positive for activity change and fatigue. Negative for appetite change and fever.  Respiratory: Negative for cough and shortness of breath.   Musculoskeletal: Positive for back pain and gait problem.  Neurological: Positive for weakness.   Psychiatric/Behavioral: Negative for behavioral problems and confusion.    Vital Signs: BP 128/79 (BP Location: Right Arm)   Pulse 96   Temp 97.7 F (36.5 C) (Oral)   Resp 20   SpO2 96%   Physical Exam  Constitutional: She is oriented to person, place, and time.  Cardiovascular: Normal rate, regular rhythm and normal heart sounds.   Pulmonary/Chest: Effort normal and breath sounds normal.  Abdominal: Soft. Bowel sounds are normal.  Musculoskeletal: Normal range of motion.  Severe upper back pain  Neurological: She is alert and oriented to person, place, and time.  Skin: Skin is warm and dry.  Psychiatric: She has a normal mood and affect. Her behavior is normal. Judgment and thought content normal.  Nursing note and vitals reviewed.   Mallampati Score:  MD Evaluation Airway: WNL Heart: WNL Abdomen: WNL Chest/ Lungs: WNL ASA  Classification: 3 Mallampati/Airway Score: One  Imaging: Dg Ribs Unilateral W/chest Left  Result Date: 10/06/2016 CLINICAL DATA:  Left rib pain. No known injury. History of osteoporosis. EXAM: LEFT RIBS AND CHEST - 3+ VIEW COMPARISON:  Chest CT 09/10/2016 FINDINGS: Question nondisplaced fracture of anterior left seventh rib. This is seen only on a single view. No displaced fractures. No evidence of acute pulmonary abnormalities such as pneumothorax, consolidation or pleural fluid normal heart size and mediastinal contours. Vertebroplasty within multiple thoracic spine compression fractures. Peripherally calcified bilateral breast implants. IMPRESSION: Possible nondisplaced anterior left seventh rib fracture. No pulmonary complication. Electronically Signed   By: Jeb Levering M.D.   On: 10/06/2016 01:36   Dg Thoracic Spine 2 View  Result Date: 09/22/2016 CLINICAL DATA:  Dorsalgia EXAM: THORACIC SPINE 2 VIEWS COMPARISON:  Chest CT with bony reformats September 10, 2016 FINDINGS: Frontal and lateral views obtained. Patient is undergone kyphoplasty  procedures at T11, T10, and T5. There is stable anterior wedging at T6. There is no new fracture. No spondylolisthesis. No appreciable extravasation of contrast from the respective vertebral bodies. There is disc space narrowing at several levels consistent with osteoarthritic change. There is no erosive change or paraspinous lesion. IMPRESSION: Several prior  anterior wedge compression fractures, unchanged. Status post kyphoplasty procedures at several levels. Note that the T11 kyphoplasty is new. No new fracture. No spondylolisthesis. Osteoarthritic changes several levels. Electronically Signed   By: Lowella Grip III M.D.   On: 09/22/2016 10:06   Ct Angio Chest Pe W And/or Wo Contrast  Result Date: 09/10/2016 CLINICAL DATA:  Sternal chest pain, back pain, known compression fractures. EXAM: CT ANGIOGRAPHY CHEST WITH CONTRAST TECHNIQUE: Multidetector CT imaging of the chest was performed using the standard protocol during bolus administration of intravenous contrast. Multiplanar CT image reconstructions and MIPs were obtained to evaluate the vascular anatomy. CONTRAST:  100 cc Isovue 370 IV COMPARISON:  Thoracic spine MRI 09/07/2016, chest radiograph 06/30/2016. Chest CT 01/06/2016 FINDINGS: Cardiovascular: No aortic dissection or aneurysm, mild aortic atherosclerosis. The heart is normal in size. There are no filling defects within the pulmonary arteries to suggest pulmonary embolus. Coronary artery calcifications versus stents. Mediastinum/Nodes: No mediastinal or hilar adenopathy. Calcified right thyroid nodule is again seen. The esophagus is decompressed. Lungs/Pleura: Advanced emphysema. Mild central bronchial thickening. Previous 3 mm pleural-based nodule is grossly stable, image 48 series 6, less well-defined currently. No new pulmonary nodule. No consolidation. No pulmonary edema. No pneumothorax or pleural fluid. Upper Abdomen: Ingested material within the stomach. No acute abnormality.  Musculoskeletal: T10 and T5 vertebral compression fractures with augmentation. T11 compression fracture involving superior endplate, characterized on recent MRI. Sternum is intact. No acute osseous abnormality. Review of the MIP images confirms the above findings. IMPRESSION: 1. No pulmonary embolus. 2. Advanced emphysema.  Central bronchial thickening is chronic. 3. Mild aortic atherosclerosis.  Coronary artery calcifications. 4. Thoracic spine compression fractures, T10 and T5 treated with vertebral augmentation. T11 compression fracture, as characterized on recent MRI. Electronically Signed   By: Jeb Levering M.D.   On: 09/10/2016 03:52   Mr Thoracic Spine Wo Contrast  Result Date: 10/05/2016 CLINICAL DATA:  76 year old female with severe thoracolumbar back pain radiating to the left side for 1 week. Previously augmented multilevel spinal compression fractures. EXAM: MRI THORACIC AND LUMBAR SPINE WITHOUT CONTRAST TECHNIQUE: Multiplanar and multiecho pulse sequences of the thoracic and lumbar spine were obtained without intravenous contrast. COMPARISON:  Images from T11 kyphoplasty 09/14/2016. Thoracic spine MRI 09/07/2016, and earlier. Lumbar MRI 05/21/2009. FINDINGS: MRI THORACIC SPINE FINDINGS Limited sagittal imaging of the cervical spine: Stable compared to 09/07/2016. Thoracic segmentation:  Normal. Alignment:  Stable. Vertebrae: Interval augmentation of the T11 mild compression fracture with regressed marrow edema at that level. Chronic previously augmented T5 and T10 compression fractures. Chronic T6 compression fracture. New mild T7 superior endplate compression fracture with mild to moderate vertebral body marrow edema. Slight retropulsion of the posterosuperior endplate. 35% loss of T7 vertebral body height. No associated spinal or foraminal stenosis. T7 posterior elements appear intact. Chronic right T9 pedicle STIR hyperintensity is unchanged and felt related to benign hemangioma. No other  acute osseous abnormality identified. Cord: Remains normal. Capacious thoracic spinal canal. Normal conus medullaris at T12-L1. Paraspinal and other soft tissues: Negative visualized thoracic and upper abdominal viscera. Mild posterior thoracic subcutaneous edema. Disc levels: Fairly capacious thoracic spinal canal. Borderline to mild multifactorial spinal stenosis at T10-T11 is stable. MRI LUMBAR SPINE FINDINGS Segmentation:  Normal. Alignment:  Stable since 2010. Vertebrae: No marrow edema or evidence of acute osseous abnormality. Visualized bone marrow signal is within normal limits. Visible sacrum and SI joints are intact. Conus medullaris: Extends to the T12-L1 level and appears normal. Paraspinal and other soft tissues: Stable  and negative. Disc levels: Stable since 2010. Capacious lumbar spinal canal. Chronic postoperative changes to the right lamina at L4-L5. No lumbar spinal stenosis or significant foraminal stenosis. IMPRESSION: MR THORACIC SPINE IMPRESSION 1. Acute to subacute T7 compression fracture with marrow edema and 35% loss of vertebral body height. Minimal retropulsion of the posterosuperior endplate. No associated spinal stenosis or other complicating features. 2. Interval T11 vertebral augmentation. Chronic T5, T6, and T10 compression fractures. MR LUMBAR SPINE IMPRESSION No acute findings. Stable MRI appearance of the lumbar spine since 2010. Electronically Signed   By: Genevie Ann M.D.   On: 10/05/2016 09:16   Mr Lumbar Spine Wo Contrast  Result Date: 10/05/2016 CLINICAL DATA:  76 year old female with severe thoracolumbar back pain radiating to the left side for 1 week. Previously augmented multilevel spinal compression fractures. EXAM: MRI THORACIC AND LUMBAR SPINE WITHOUT CONTRAST TECHNIQUE: Multiplanar and multiecho pulse sequences of the thoracic and lumbar spine were obtained without intravenous contrast. COMPARISON:  Images from T11 kyphoplasty 09/14/2016. Thoracic spine MRI 09/07/2016,  and earlier. Lumbar MRI 05/21/2009. FINDINGS: MRI THORACIC SPINE FINDINGS Limited sagittal imaging of the cervical spine: Stable compared to 09/07/2016. Thoracic segmentation:  Normal. Alignment:  Stable. Vertebrae: Interval augmentation of the T11 mild compression fracture with regressed marrow edema at that level. Chronic previously augmented T5 and T10 compression fractures. Chronic T6 compression fracture. New mild T7 superior endplate compression fracture with mild to moderate vertebral body marrow edema. Slight retropulsion of the posterosuperior endplate. 35% loss of T7 vertebral body height. No associated spinal or foraminal stenosis. T7 posterior elements appear intact. Chronic right T9 pedicle STIR hyperintensity is unchanged and felt related to benign hemangioma. No other acute osseous abnormality identified. Cord: Remains normal. Capacious thoracic spinal canal. Normal conus medullaris at T12-L1. Paraspinal and other soft tissues: Negative visualized thoracic and upper abdominal viscera. Mild posterior thoracic subcutaneous edema. Disc levels: Fairly capacious thoracic spinal canal. Borderline to mild multifactorial spinal stenosis at T10-T11 is stable. MRI LUMBAR SPINE FINDINGS Segmentation:  Normal. Alignment:  Stable since 2010. Vertebrae: No marrow edema or evidence of acute osseous abnormality. Visualized bone marrow signal is within normal limits. Visible sacrum and SI joints are intact. Conus medullaris: Extends to the T12-L1 level and appears normal. Paraspinal and other soft tissues: Stable and negative. Disc levels: Stable since 2010. Capacious lumbar spinal canal. Chronic postoperative changes to the right lamina at L4-L5. No lumbar spinal stenosis or significant foraminal stenosis. IMPRESSION: MR THORACIC SPINE IMPRESSION 1. Acute to subacute T7 compression fracture with marrow edema and 35% loss of vertebral body height. Minimal retropulsion of the posterosuperior endplate. No associated  spinal stenosis or other complicating features. 2. Interval T11 vertebral augmentation. Chronic T5, T6, and T10 compression fractures. MR LUMBAR SPINE IMPRESSION No acute findings. Stable MRI appearance of the lumbar spine since 2010. Electronically Signed   By: Genevie Ann M.D.   On: 10/05/2016 09:16   Mr Thoracic Spine W Wo Contrast  Result Date: 09/07/2016 CLINICAL DATA:  76 year old female with history of prior kyphoplasty. Mid back pain onset 10 days ago. Difficulty ambulating. Weakness. Subsequent encounter. EXAM: MRI THORACIC WITHOUT AND WITH CONTRAST TECHNIQUE: Multiplanar and multiecho pulse sequences of the thoracic spine were obtained without and with intravenous contrast. CONTRAST:  57mL MULTIHANCE GADOBENATE DIMEGLUMINE 529 MG/ML IV SOLN COMPARISON:  07/15/2016 kyphoplasty. 07/06/2016 in 12/2013 2017 MR. 01/06/2016 CT. FINDINGS: MRI THORACIC SPINE FINDINGS Alignment:  Mild thoracic kyphosis. Vertebrae: Remote T5 compression fracture treated with cement augmentation. T10 compression fracture  recently treated with cement augmentation (07/15/2016). Interval further loss of height of the T10 vertebra (approximately 15-20%). No significant change nonspecific signal intensity within the right T9 facet/rib articulation possibly hemangioma as previously noted. Stress phenomena could cause a similar appearance. Malignancy less likely consideration. New T11 superior endplate compression fracture with subchondral edema, 25-30% loss of height and mild retropulsion posterosuperior aspect contributing to narrowing of the ventral thecal sac approaching but not compressing the cord. Cord:  No abnormal signal or enhancement. Paraspinal and other soft tissues: No worrisome abnormality. Atherosclerotic changes thoracic aorta. Disc levels: C6-7: Cervical spondylotic changes suspected incompletely assessed. C7-T1:  Minimal anterior slips C7.  Minimal bulge. T1-2:  Negative. T2-3:  Minimal bulge. T3-4:  Negative. T4-5:   Minimal bulge right paracentral position. T5-6:  Bulge/spur with slight narrowing ventral thecal sac. T6-7:  Tiny left paracentral protrusion. T7-8:  Negative. T8-9: Bulge slightly greater to right. Slight narrowing ventral thecal sac. T9-10:  Minimal spur. T10-11: Retropulsed posterosuperior aspect T11 vertebral body/spur with narrowing ventral thecal sac greater on the right. T11-12:  Minimal bulge. T12-L1:  Minimal bulge.  Schmorl's node deformity. L1-2:  Small Schmorl's node deformity. L2-3:  Minimal Schmorl's node deformity. IMPRESSION: New T11 superior endplate compression fracture with subchondral edema, 25-30% loss of height and mild retropulsion posterosuperior aspect contributing to narrowing of the ventral thecal sac approaching but not compressing the cord. T10 compression fracture recently treated with cement augmentation (07/15/2016). Interval further loss of height of the T10 vertebra (approximately 15-20%). Remote T5 compression fracture treated with cement augmentation. No significant change nonspecific signal intensity within the right T9 facet/rib articulation possibly hemangioma as previously noted. Stress phenomena could cause a similar appearance. Malignancy less likely consideration. Electronically Signed   By: Genia Del M.D.   On: 09/07/2016 08:16   Ir Kypho Ea Addl Level Thoracic Or Lumbar  Result Date: 09/14/2016 INDICATION: 76 year old female with new T11 compression fracture and lifestyle limiting 10 out of 10 pain. EXAM: IR KYPHO VERTEBRAL ADDL AGMENTATION COMPARISON:  MRI 09/07/2016, CT 09/10/2016 MEDICATIONS: As antibiotic prophylaxis, 2.0 g Ancef was ordered pre-procedure and administered intravenously within 1 hour of incision. ANESTHESIA/SEDATION: Moderate (conscious) sedation was employed during this procedure. A total of Versed 2.0 mg and Fentanyl 100 mcg, 1 mg Dilaudid was administered intravenously. Moderate Sedation Time: 30 minutes. The patient's level of consciousness  and vital signs were monitored continuously by radiology nursing throughout the procedure under my direct supervision. FLUOROSCOPY TIME:  Fluoroscopy Time: 5 minutes 12 seconds (59 mGy) COMPLICATIONS: None PROCEDURE: Following a full explanation of the procedure along with the potentially associated complications, a witnessed informed consent was obtained. Specific risks that were discussed included bleeding, infection, injury to adjacent structures, neurologic injury, embolization of cement within the veins, failure of the procedure to improve pain, need for further procedure/ surgery, cardiopulmonary collapse, death. The patient understands the risks and wishes to proceed. The patient was placed prone on the fluoroscopic table. Nasal oxygen was administered. Physiologic monitoring was performed throughout the duration of the procedure. The skin overlying the T11 region was prepped and draped in the usual sterile fashion. The T11 vertebral body was identified and 1% lidocaine was used to anesthetize the skin overlying the right pedicle to the level of the bone. A small incision was made with 11 blade scalpel, and then a Medtronic 8 gauge needle was advanced to the bone adjacent to the right pedicle via extra pedicular approach into the anterior 1/3 of the vertebral body. Inflation of  the right pedicular cannula balloon was performed under fluoroscopic observation, with central balloon location within the vertebral body. Methylmethacrylate mixture was then reconstituted. Under biplane intermittent fluoroscopy, the methylmethacrylate was then hand injected into the cavity at T11 vertebral body with filling of the fracture cleft. No extravasation was noted posteriorly into the spinal canal. No epidural venous contamination was seen. The cannula was then removed. Hemostasis was achieved at the skin entry site. There were no acute complications. Patient tolerated the procedure well. The patient was observed for 30  minutes and returned to her room in good condition. IMPRESSION: Status post right sided extra pedicular approach for kyphoplasty treatment of new T11 compression fracture. Signed, Dulcy Fanny. Earleen Newport, DO [facility] Electronically Signed   By: Corrie Mckusick D.O.   On: 09/14/2016 13:32    Labs:  CBC:  Recent Labs  09/09/16 2245 09/11/16 0312 09/13/16 0505 10/06/16 0005  WBC 19.1* 11.8* 8.3 14.2*  HGB 13.2 12.8 10.6* 11.1*  HCT 40.6 42.2 34.4* 35.0*  PLT 233 272 227 214    COAGS:  Recent Labs  02/19/16 0823 07/15/16 1016 09/14/16 0850 10/06/16 0957  INR 0.89 0.85 0.92 0.80  APTT 22* 22*  --  24    BMP:  Recent Labs  09/09/16 2245 09/10/16 1538 09/11/16 0312 09/13/16 0505 10/06/16 0005  NA 145  --  143 138 140  K 3.2*  --  4.3 4.6 3.5  CL 99*  --  97* 97* 100*  CO2 33*  --  36* 34* 30  GLUCOSE 130*  --  116* 124* 79  BUN 12  --  12 13 12   CALCIUM 9.1  --  9.1 8.7* 8.6*  CREATININE 1.10* 1.01* 1.10* 0.99 0.81  GFRNONAA 48* 53* 48* 54* >60  GFRAA 55* >60 55* >60 >60    LIVER FUNCTION TESTS:  Recent Labs  01/06/16 1120 02/05/16 0856  BILITOT 0.4 0.4  AST 18 13  ALT 16 11  ALKPHOS 57 78  PROT 5.7* 6.1  ALBUMIN 3.4* 3.7    TUMOR MARKERS: No results for input(s): AFPTM, CEA, CA199, CHROMGRNA in the last 8760 hours.  Assessment and Plan:  New painful Thoracic 7 compression fracture Scheduled for Vertebroplasty in IR Friday  Need off Plavix 5 days; Insurance being pre certified now Pt and husband aware all must be approved to proceed. Risks and Benefits discussed with the patient including, but not limited to education regarding the natural healing process of compression fractures without intervention, bleeding, infection, cement migration which may cause spinal cord damage, paralysis, pulmonary embolism or even death. All of the patient's questions were answered, patient is agreeable to  proceed. Consent signed and in chart.  Thank you for this interesting consult.  I greatly enjoyed meeting LLANA DESHAZO and look forward to participating in their care.  A copy of this report was sent to the requesting provider on this date.  Electronically Signed: Dalessandro Baldyga A 10/06/2016, 12:30 PM   I spent a total of 40 Minutes    in face to face in clinical consultation, greater than 50% of which was counseling/coordinating care for T7 VP

## 2016-10-06 NOTE — Progress Notes (Signed)
Pt seen and examined, admitted this am by Dr.Niu,  Alexis Higgins is a 76 y.o. 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 recently hospitalized from 4/20-4/25 due to T 11 compression fracture. Patient underwent kyphoplasty on 4/24. She had an MRI of lumbar and T-spine 5/15, which showed new T7 compression fracture. taking tramadol and Voltaren get without significant relief, chest x-ray showed possible nondisplaced anterior left seventh rib fracture.  Intractable back pain: T7 compression fracture. -IR consulted to eval for kyphoplasty -Dilaudid/percocet PRN -hold plavix, held since Sunday -lidocaine patch  Chronic respiratory failure with O2 dependent COPD: - on chronic prednisone, has osteoporosis. Her prednisone dose was decreased to 10 mg daily recently, but pt had worsening shortness of breath, therefore the dose was adjusted to 15 mg daily now. Her COPD is stable today. -continue home dose of prednisone -spiriva and albuterol PRN  Leukocytosis: -likely reactive, monitor  Hypertension  -stable, continue metoprolol  Depression:  -stable, on cymbalta  CAD: s/p of stent, BMS.  -stable, continue plavix, metoprolol/imdur/statin  Domenic Polite, MD

## 2016-10-06 NOTE — Telephone Encounter (Signed)
Called pt, LVM. To discuss the voltaren gel was given to him during the [place] with wife. He must drop the script off at the pharmacy to have filled.

## 2016-10-06 NOTE — Care Management Note (Signed)
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 Home health services (RN,PT) were set up [facility] @ D/C. Per husband pt is wheelchair bound. DME: home oxygen, 2L , provided per [facility]. Husband states he is pt's primary caretaker.   [name] (Spouse)      [phone]      Pcp: Cathlean Cower  Action/Plan:  Scheduled for Vertebroplasty in IR Friday .......discharge planning in process, CM following for disposition needs.  Expected Discharge Date:  10/08/16               Expected Discharge Plan:  [place]  In-House Referral:     Discharge planning Services  CM Consult  Post Acute Care Choice:  Resumption of Svcs/PTA Provider, Durable Medical Equipment, Home Health Choice offered to:  Patient, Spouse  DME Arranged:  Oxygen (pt is active with [facility], home oxygen (2l)) DME Agency:  [place]:  PT, RN [facility] Agency:  [facility] ([facility])  Status of Service:  In process, will continue to follow  If discussed at Long Length of Stay Meetings, dates discussed:    Additional Comments:  Sharin Mons, RN 10/06/2016, 1:08 PM

## 2016-10-06 NOTE — ED Notes (Signed)
Responded to call light. Patient's family member reporting that patient's eyes were rolling back in her head. At this time patient's eyes are not doing so. PERRL. No gaze deficits appreciated, able to close and open eyes on command.

## 2016-10-06 NOTE — Progress Notes (Signed)
Patient arrived to room via stretcher, 2L oxygen nasal cannula, husband at bedside.  Patient complains of pain 10/10 in back radiating to left side.  Assessment complete, call bell within reach.  Wilson Singer, RN

## 2016-10-06 NOTE — Telephone Encounter (Signed)
Alexis Higgins will be submitting information into insurance co to get summary of benefits/copay to start prolia injections and will call patient back with that information

## 2016-10-06 NOTE — Telephone Encounter (Signed)
-----   Message from Biagio Borg, MD sent at 10/03/2016  5:10 PM EDT ----- Regarding: new prolia start Please to look into prolia start for this pt, thanks

## 2016-10-06 NOTE — Progress Notes (Signed)
PT Cancellation Note  Patient Details Name: VERLISA VARA MRN: [id number] DOB: [birth year]   Cancelled Treatment:    Reason Eval/Treat Not Completed: PT screened, no needs identified, will sign off.  Pt's spouse does not wish any therapy at this time due to wants no unnecessary mobility to potentially hurt her further.   10/06/2016  Donnella Sham, [place] [phone]  (pager)   Tessie Fass Kolin Erdahl 10/06/2016, 3:21 PM

## 2016-10-06 NOTE — H&P (Signed)
History and Physical    Alexis Higgins CWC:[id number] DOB: 03-Sep-1940 DOA: 10/05/2016  Referring MD/NP/PA:   PCP: Biagio Borg, MD   Patient coming from:  The patient is coming from home.  At baseline, pt is partially dependent for most of ADL.   Chief Complaint: Intractable back pain  HPI: Alexis Higgins is a 76 y.o. 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  Patient was recently hospitalized from 4/20-4/25 due to T 11 compression fracture. Patient underwent kyphoplasty on 4/24. She has been doing fine until three days ago when she started having worsening back pain. The back pain is located in the middle back. It is sharp, severe, 10 out of the severity, radiating to the left flank and rib cage area. Patient denies leg numbness or weakness. No incontinence of bowel movement and bladder. She had an MRI of lumbar and T-spine yesterday, which showed new T7 compression fracture. Patient is taking tramadol and Voltaren get without significant relief. Patient denies chest pain, GI symptoms or symptoms of UTI. No cough. She has mild shortness of breath due to COPD which is at baseline. No fever or chills.  ED Course: pt was found to have WBC 14.2, electrolytes renal function okay, temperature normal, O2 sats 97% on room air, chest x-ray showed possible nondisplaced anterior left seventh rib fracture. Pt is placed on tele bed for obs  MRI of lumbar and T spin on 10/06/14 showed 1. Acute to subacute T7 compression fracture with marrow edema and 35% loss of vertebral body height. Minimal retropulsion of the posterosuperior endplate. No associated spinal stenosis or other complicating features. 2. Interval T11 vertebral augmentation. Chronic T5, T6, and T10  compression fractures. 3. Stable MRI appearance of the lumbar spine since 2010.  Review of Systems:   General: no fevers, chills, no changes in body weight, has fatigue HEENT: no blurry vision, hearing changes or sore throat Respiratory: has dyspnea, no coughing, wheezing CV: no chest pain, no palpitations GI: no nausea, vomiting, abdominal pain, diarrhea, constipation GU: no dysuria, burning on urination, increased urinary frequency, hematuria  Ext: no leg edema Neuro: no unilateral weakness, numbness, or tingling, no vision change or hearing loss Skin: no rash, no skin tear. MSK: has back pain Heme: No easy bruising.  Travel history: No recent long distant travel.  Allergy:  Allergies  Allergen Reactions  . Aspirin Other (See Comments)     cns bleed risk  . Codeine Anaphylaxis and Rash  . Other Other (See Comments)    Stiolto - severe reaction - caused inability to breath    Past Medical History:  Diagnosis Date  . Abnormal chest x-ray    03/2012: will need OP f/u.  Marland Kitchen Anginal pain ([place])   . ANXIETY 01/01/2007  . BURSITIS, RIGHT HIP 06/04/2009  . CAD (coronary artery disease)    a. BMS to LAD 2010. b. NSTEMI with DES to LAD for ISR 2011. c. Patent stent 03/2012/Imdur added.  . Cataract   . CHEST PAIN-PRECORDIAL 01/15/2009  . Colon polyps    H/o tubular adenoma of colon  . COPD 01/01/2007   a. Chronic resp failure on home O2.  Marland Kitchen DEPRESSION 01/01/2007  . Eczema 01/08/2011  . GROIN PAIN 06/20/2008  . Headache(784.0) 01/01/2007  . Hemorrhoid   . HYPERTENSION 01/01/2007  . Impaired glucose  tolerance 01/07/2011  . LOW BACK PAIN 01/01/2007  . Muscle weakness (generalized) 06/04/2009  . On home oxygen therapy    "2L; 24/7" (09/10/2016)  . OSTEOARTHRITIS, HIP 07/01/2008  . OSTEOPOROSIS 01/01/2007  . Pneumonia 1998  . Rosacea 01/08/2011  . Shortness of breath   . SYNCOPE 01/01/2007  . Thoracic compression fracture ([place])   . TIA (transient ischemic attack)   . TRANSIENT ISCHEMIC  ATTACK, HX OF 01/01/2007    Past Surgical History:  Procedure Laterality Date  . ABDOMINAL HYSTERECTOMY    . APPENDECTOMY    . BACK SURGERY    . BREAST ENHANCEMENT SURGERY    . COLONOSCOPY  01/25/2002   tubular adenoma,hemorrhoids, hyperplastic  colon polyps  . COLONOSCOPY  02/17/2005   hemorrhoids  . CORONARY ANGIOPLASTY    . CORONARY STENT PLACEMENT    . HIP ARTHROPLASTY Left 10/01/2013   Procedure: ARTHROPLASTY UNIPOLAR   HIP;  Surgeon: Marianna Payment, MD;  Location: [facility];  Service: Orthopedics;  Laterality: Left;  . HIP SURGERY Left    DR XU     PROXIMAL NECK   . IR GENERIC HISTORICAL  02/19/2016   IR VERTEBROPLASTY CERV/THOR BX INC UNI/BIL INC/INJECT/IMAGING 02/19/2016 Luanne Bras, MD [facility]  . IR GENERIC HISTORICAL  07/15/2016   IR KYPHO THORACIC WITH BONE BIOPSY 07/15/2016 Luanne Bras, MD [facility]  . IR KYPHO EA ADDL LEVEL THORACIC OR LUMBAR  09/14/2016  . LEFT HEART CATHETERIZATION WITH CORONARY ANGIOGRAM N/A 04/13/2012   Procedure: LEFT HEART CATHETERIZATION WITH CORONARY ANGIOGRAM;  Surgeon: Burnell Blanks, MD;  Location: [facility] CATH LAB;  Service: Cardiovascular;  Laterality: N/A;  . OOPHORECTOMY     one ovary  . s/p bilat cataract  2010  . sp lumbar disc surgury     [name]    Social History:  reports that she quit smoking about 11 years ago. Her smoking use included Cigarettes. She has a 76.50 pack-year smoking history. She has never used smokeless tobacco. She reports that she does not drink alcohol or use drugs.  Family History:  Family History  Problem Relation Age of Onset  . Osteoporosis Mother   . Heart disease Sister   . Emphysema Sister   . Seizures Sister        epilepsy  . Cardiomyopathy Sister   . Heart attack Sister      Prior to Admission medications   Medication Sig Start Date End Date Taking? Authorizing Provider  ascorbic acid (VITAMIN C) 1000 MG tablet Take 1,000 mg by mouth daily.   Yes [provider]  Cholecalciferol (VITAMIN D) 1000 UNITS capsule Take 1,000 Units by mouth every evening.    Yes [provider]  diclofenac sodium (VOLTAREN) 1 % GEL Apply 4 g topically 4 (four) times daily. 09/29/16  Yes Biagio Borg, MD  docusate sodium (COLACE) 100 MG capsule Take 100 mg by mouth daily as needed. For constipation   Yes [provider]  DULoxetine (CYMBALTA) 30 MG capsule Take 1 capsule (30 mg total) by mouth daily. 09/29/16  Yes Biagio Borg, MD  furosemide (LASIX) 20 MG tablet Take 1 tablet (20 mg total) by mouth daily. 09/29/16  Yes Biagio Borg, MD  isosorbide mononitrate (IMDUR) 30 MG 24 hr tablet TAKE 0.5 TABLETS (15 MG TOTAL) BY MOUTH DAILY. 12/11/15  Yes Burnell Blanks, MD  lidocaine (LIDODERM) 5 % Place 1 patch onto the skin daily. Remove & Discard patch within 12 hours or  as directed by MD 02/05/16  Yes Long, Wonda Olds, MD  lovastatin (MEVACOR) 20 MG tablet TAKE 1 TABLET BY MOUTH AT BEDTIME 03/10/16  Yes Biagio Borg, MD  metoprolol tartrate (LOPRESSOR) 25 MG tablet TAKE 1/2 TABLET BY MOUTH 2 TIMES A DAY 02/10/16  Yes [provider]  nitroGLYCERIN (NITROSTAT) 0.4 MG SL tablet Place 1 tablet (0.4 mg total) under the tongue every 5 (five) minutes as needed for chest pain (up to 3 doses). 04/13/12  Yes Dunn, Dayna N, PA-C  ondansetron (ZOFRAN) 4 MG tablet Take 1 tablet (4 mg total) by mouth every 8 (eight) hours as needed for nausea or vomiting. Patient taking differently: Take 4 mg by mouth 2 (two) times daily.  01/06/16  Yes Mackuen, Courteney Lyn, MD  polyethylene glycol (MIRALAX / GLYCOLAX) packet Take 17 g by mouth daily as needed for mild constipation.   Yes [provider]  polyvinyl alcohol (LIQUIFILM TEARS) 1.4 % ophthalmic solution Place 1 drop into both eyes daily as needed for dry eyes.    Yes [provider]  predniSONE (DELTASONE) 10 MG tablet Take 15 mg by mouth daily with breakfast.   Yes [provider]  PROAIR HFA  108 (90 Base) MCG/ACT inhaler INHALE 2 PUFFS INTO THE LUNGS EVERY 6 (SIX) HOURS AS NEEDED FOR WHEEZING OR SHORTNESS OF BREATH. 07/20/16  Yes Biagio Borg, MD  tiotropium (SPIRIVA HANDIHALER) 18 MCG inhalation capsule Place 1 capsule (18 mcg total) into inhaler and inhale daily at 6 (six) AM. 05/26/16  Yes Biagio Borg, MD  traMADol (ULTRAM) 50 MG tablet Take 100 mg by mouth 2 (two) times daily.    Yes [provider]  vitamin B-12 (CYANOCOBALAMIN) 1000 MCG tablet Take 1,000 mcg by mouth daily.   Yes [provider]  clopidogrel (PLAVIX) 75 MG tablet TAKE 1 TABLET (75 MG TOTAL) BY MOUTH DAILY. 12/11/15   Burnell Blanks, MD  LORazepam (ATIVAN) 0.5 MG tablet Take 1 tablet (0.5 mg total) by mouth 2 (two) times daily as needed for anxiety. Patient not taking: Reported on 10/06/2016 10/05/16   Biagio Borg, MD    Physical Exam: Vitals:   10/06/16 0200 10/06/16 0230 10/06/16 0400 10/06/16 0500  BP: (!) 155/75 137/87 (!) 114/91 (!) 143/88  Pulse: 88 89 91 95  Resp:      Temp:      TempSrc:      SpO2: 99% 97% 98% 96%   General: Not in acute distress HEENT:       Eyes: PERRL, EOMI, no scleral icterus.       ENT: No discharge from the ears and nose, no pharynx injection, no tonsillar enlargement.        Neck: No JVD, no bruit, no mass felt. Heme: No neck lymph node enlargement. Cardiac: S1/S2, RRR, No murmurs, No gallops or rubs. Respiratory:  No rales, wheezing, rhonchi or rubs. GI: Soft, nondistended, nontender, no rebound pain, no organomegaly, BS present. GU: No hematuria Ext: No pitting leg edema bilaterally. 2+DP/PT pulse bilaterally. Musculoskeletal: has tenderness in lower back and left lateral rib cage. Skin: No rashes.  Neuro: Alert, oriented X3, cranial nerves II-XII grossly intact, moves all extremities normally.  Psych: Patient is not psychotic, no suicidal or hemocidal ideation.  Labs on Admission: I have personally reviewed following labs and imaging  studies  CBC:  Recent Labs Lab 10/06/16 0005  WBC 14.2*  NEUTROABS 11.1*  HGB 11.1*  HCT 35.0*  MCV 90.2  PLT 517   Basic Metabolic Panel:  Recent Labs Lab 10/06/16 0005  NA 140  K 3.5  CL 100*  CO2 30  GLUCOSE 79  BUN 12  CREATININE 0.81  CALCIUM 8.6*   GFR: Estimated Creatinine Clearance: 56.2 mL/min (by C-G formula based on SCr of 0.81 mg/dL). Liver Function Tests: No results for input(s): AST, ALT, ALKPHOS, BILITOT, PROT, ALBUMIN in the last 168 hours. No results for input(s): LIPASE, AMYLASE in the last 168 hours. No results for input(s): AMMONIA in the last 168 hours. Coagulation Profile: No results for input(s): INR, PROTIME in the last 168 hours. Cardiac Enzymes: No results for input(s): CKTOTAL, CKMB, CKMBINDEX, TROPONINI in the last 168 hours. BNP (last 3 results) No results for input(s): PROBNP in the last 8760 hours. HbA1C: No results for input(s): HGBA1C in the last 72 hours. CBG: No results for input(s): GLUCAP in the last 168 hours. Lipid Profile: No results for input(s): CHOL, HDL, LDLCALC, TRIG, CHOLHDL, LDLDIRECT in the last 72 hours. Thyroid Function Tests: No results for input(s): TSH, T4TOTAL, FREET4, T3FREE, THYROIDAB in the last 72 hours. Anemia Panel: No results for input(s): VITAMINB12, FOLATE, FERRITIN, TIBC, IRON, RETICCTPCT in the last 72 hours. Urine analysis:    Component Value Date/Time   COLORURINE YELLOW 08/01/2015 1050   APPEARANCEUR CLEAR 08/01/2015 1050   LABSPEC <=1.005 (A) 08/01/2015 1050   PHURINE 6.0 08/01/2015 1050   GLUCOSEU NEGATIVE 08/01/2015 1050   HGBUR NEGATIVE 08/01/2015 1050   BILIRUBINUR NEGATIVE 08/01/2015 1050   KETONESUR NEGATIVE 08/01/2015 1050   PROTEINUR NEGATIVE 11/22/2013 2000   UROBILINOGEN 0.2 08/01/2015 1050   NITRITE NEGATIVE 08/01/2015 1050   LEUKOCYTESUR NEGATIVE 08/01/2015 1050   Sepsis Labs: @LABRCNTIP (procalcitonin:4,lacticidven:4) )No results found for this or any previous visit  (from the past 240 hour(s)).   Radiological Exams on Admission: Dg Ribs Unilateral W/chest Left  Result Date: 10/06/2016 CLINICAL DATA:  Left rib pain. No known injury. History of osteoporosis. EXAM: LEFT RIBS AND CHEST - 3+ VIEW COMPARISON:  Chest CT 09/10/2016 FINDINGS: Question nondisplaced fracture of anterior left seventh rib. This is seen only on a single view. No displaced fractures. No evidence of acute pulmonary abnormalities such as pneumothorax, consolidation or pleural fluid normal heart size and mediastinal contours. Vertebroplasty within multiple thoracic spine compression fractures. Peripherally calcified bilateral breast implants. IMPRESSION: Possible nondisplaced anterior left seventh rib fracture. No pulmonary complication. Electronically Signed   By: Jeb Levering M.D.   On: 10/06/2016 01:36   Mr Thoracic Spine Wo Contrast  Result Date: 10/05/2016 CLINICAL DATA:  76 year old female with severe thoracolumbar back pain radiating to the left side for 1 week. Previously augmented multilevel spinal compression fractures. EXAM: MRI THORACIC AND LUMBAR SPINE WITHOUT CONTRAST TECHNIQUE: Multiplanar and multiecho pulse sequences of the thoracic and lumbar spine were obtained without intravenous contrast. COMPARISON:  Images from T11 kyphoplasty 09/14/2016. Thoracic spine MRI 09/07/2016, and earlier. Lumbar MRI 05/21/2009. FINDINGS: MRI THORACIC SPINE FINDINGS Limited sagittal imaging of the cervical spine: Stable compared to 09/07/2016. Thoracic segmentation:  Normal. Alignment:  Stable. Vertebrae: Interval augmentation of the T11 mild compression fracture with regressed marrow edema at that level. Chronic previously augmented T5 and T10 compression fractures. Chronic T6 compression fracture. New mild T7 superior endplate compression fracture with mild to moderate vertebral body marrow edema. Slight retropulsion of the posterosuperior endplate. 35% loss of T7 vertebral body height. No  associated spinal or foraminal stenosis. T7 posterior elements appear intact. Chronic right T9 pedicle STIR hyperintensity is unchanged  and felt related to benign hemangioma. No other acute osseous abnormality identified. Cord: Remains normal. Capacious thoracic spinal canal. Normal conus medullaris at T12-L1. Paraspinal and other soft tissues: Negative visualized thoracic and upper abdominal viscera. Mild posterior thoracic subcutaneous edema. Disc levels: Fairly capacious thoracic spinal canal. Borderline to mild multifactorial spinal stenosis at T10-T11 is stable. MRI LUMBAR SPINE FINDINGS Segmentation:  Normal. Alignment:  Stable since 2010. Vertebrae: No marrow edema or evidence of acute osseous abnormality. Visualized bone marrow signal is within normal limits. Visible sacrum and SI joints are intact. Conus medullaris: Extends to the T12-L1 level and appears normal. Paraspinal and other soft tissues: Stable and negative. Disc levels: Stable since 2010. Capacious lumbar spinal canal. Chronic postoperative changes to the right lamina at L4-L5. No lumbar spinal stenosis or significant foraminal stenosis. IMPRESSION: MR THORACIC SPINE IMPRESSION 1. Acute to subacute T7 compression fracture with marrow edema and 35% loss of vertebral body height. Minimal retropulsion of the posterosuperior endplate. No associated spinal stenosis or other complicating features. 2. Interval T11 vertebral augmentation. Chronic T5, T6, and T10 compression fractures. MR LUMBAR SPINE IMPRESSION No acute findings. Stable MRI appearance of the lumbar spine since 2010. Electronically Signed   By: Genevie Ann M.D.   On: 10/05/2016 09:16   Mr Lumbar Spine Wo Contrast  Result Date: 10/05/2016 CLINICAL DATA:  76 year old female with severe thoracolumbar back pain radiating to the left side for 1 week. Previously augmented multilevel spinal compression fractures. EXAM: MRI THORACIC AND LUMBAR SPINE WITHOUT CONTRAST TECHNIQUE: Multiplanar and  multiecho pulse sequences of the thoracic and lumbar spine were obtained without intravenous contrast. COMPARISON:  Images from T11 kyphoplasty 09/14/2016. Thoracic spine MRI 09/07/2016, and earlier. Lumbar MRI 05/21/2009. FINDINGS: MRI THORACIC SPINE FINDINGS Limited sagittal imaging of the cervical spine: Stable compared to 09/07/2016. Thoracic segmentation:  Normal. Alignment:  Stable. Vertebrae: Interval augmentation of the T11 mild compression fracture with regressed marrow edema at that level. Chronic previously augmented T5 and T10 compression fractures. Chronic T6 compression fracture. New mild T7 superior endplate compression fracture with mild to moderate vertebral body marrow edema. Slight retropulsion of the posterosuperior endplate. 35% loss of T7 vertebral body height. No associated spinal or foraminal stenosis. T7 posterior elements appear intact. Chronic right T9 pedicle STIR hyperintensity is unchanged and felt related to benign hemangioma. No other acute osseous abnormality identified. Cord: Remains normal. Capacious thoracic spinal canal. Normal conus medullaris at T12-L1. Paraspinal and other soft tissues: Negative visualized thoracic and upper abdominal viscera. Mild posterior thoracic subcutaneous edema. Disc levels: Fairly capacious thoracic spinal canal. Borderline to mild multifactorial spinal stenosis at T10-T11 is stable. MRI LUMBAR SPINE FINDINGS Segmentation:  Normal. Alignment:  Stable since 2010. Vertebrae: No marrow edema or evidence of acute osseous abnormality. Visualized bone marrow signal is within normal limits. Visible sacrum and SI joints are intact. Conus medullaris: Extends to the T12-L1 level and appears normal. Paraspinal and other soft tissues: Stable and negative. Disc levels: Stable since 2010. Capacious lumbar spinal canal. Chronic postoperative changes to the right lamina at L4-L5. No lumbar spinal stenosis or significant foraminal stenosis. IMPRESSION: MR THORACIC  SPINE IMPRESSION 1. Acute to subacute T7 compression fracture with marrow edema and 35% loss of vertebral body height. Minimal retropulsion of the posterosuperior endplate. No associated spinal stenosis or other complicating features. 2. Interval T11 vertebral augmentation. Chronic T5, T6, and T10 compression fractures. MR LUMBAR SPINE IMPRESSION No acute findings. Stable MRI appearance of the lumbar spine since 2010. Electronically Signed  By: Genevie Ann M.D.   On: 10/05/2016 09:16     EKG:  Not done in ED, will get one.   Assessment/Plan Principal Problem:   Intractable back pain Active Problems:   Hyperlipidemia   Depression   Essential hypertension   COPD (chronic obstructive pulmonary disease) with emphysema (HCC)   CAD (coronary artery disease)   Chronic respiratory failure (HCC)   Closed compression fracture of thoracic vertebra (HCC)   Back pain   Intractable back pain: this mainly due to new T7 compression fracture. Pt also has chronic T5, T6, and T10 compression fractures which may have partially contributed. Pt has severe pain and home pain medications do not help anymore. Patient's husband states that he tried to call IR, Dr Marjory Lies for possible kyphoplasty, but doctor will not have time until next Tuesday.  -will place on med-surg bed for obs -pain control: Switch to tramadol to when necessary Percocet, prn IV dilaudid. -continue home Voltaren gel and Lidoderm patch -prn Robaxin for muscle spasm -PT/OT -May discuss with IR in morning for possible kyphoplasty again. -consult to CM  Chronic respiratory failure with O2 dependent COPD: Patient is on chronic steroid use. Pt has osteoporosis. Her prednisone dose was decreased to 10 mg daily recently, but pt had worsening shortness of breath, therefore the dose was adjusted to 15 mg daily now. Her COPD is stable today. -continue home dose of prednisone -When necessary albuterol nebulizer - continue spiriva  inhaler  Leukocytosis: WBC 14.2. No fever. No signs of infection. Likely due to steroid induced demargination. -f/u by CBC  Hypertension - currently stable -continue Lasix, metoprolol  Hyperlipidemia  -Pravastatin   Depression: Stable, no suicidal or homicidal ideations. -Continue home medications: Cymbalta  CAD: s/p of stent, BMS. No chest pain -continue Plavix, metoprolol, imndur -When necessary nitroglycerin -Pravastatin   DVT ppx: SQ Lovenox Code Status: Full code Family Communication: Yes, patient's husband at bed side Disposition Plan:  Anticipate discharge back to previous home environment Consults called:  none Admission status:  medical floor/obs    Date of Service 10/06/2016    Ivor Costa [facility] Pager [phone]  If 7PM-7AM, please contact night-coverage [email or web address] Password [facility] 10/06/2016, 5:46 AM

## 2016-10-06 NOTE — ED Notes (Addendum)
Pt presents with back pain s/p T7 fracture.  Pt has hx of osteoporosis secondary to long-term steroid use. Has had 3 other spinal surgeries.  Pt has been having anxiety & panic attacks regarding getting up and going to the BR.  Afraid she's going to fall.  Pt has been seen for this and has cymbalta on board.  Also having pain to ribs.  Husband states patient uses a wheelchair at home and was getting around well aside from the panic attacks.  Pt's husband has been using prescribed therapies at home, without relief.

## 2016-10-06 NOTE — ED Notes (Signed)
MD Roxanne Mins informed of "patient's eyes rolling back in her head" which was reported by family member. MD acknowledged.

## 2016-10-06 NOTE — Progress Notes (Signed)
CM received consult:Home health needs. PT and OT evaluations pending. CM to f/u with disposition needs. Whitman Hero RN,BSN,CM

## 2016-10-06 NOTE — ED Notes (Signed)
Pt able to ambulate several steps to wheelchair.

## 2016-10-07 LAB — BASIC METABOLIC PANEL
Anion gap: 11 (ref 5–15)
BUN: 16 mg/dL (ref 6–20)
CALCIUM: 8.8 mg/dL — AB (ref 8.9–10.3)
CO2: 30 mmol/L (ref 22–32)
CREATININE: 0.97 mg/dL (ref 0.44–1.00)
Chloride: 99 mmol/L — ABNORMAL LOW (ref 101–111)
GFR, EST NON AFRICAN AMERICAN: 56 mL/min — AB (ref >60)
Glucose, Bld: 85 mg/dL (ref 65–99)
Potassium: 4 mmol/L (ref 3.5–5.1)
Sodium: 140 mmol/L (ref 135–145)

## 2016-10-07 LAB — CBC
HCT: 38.3 % (ref 36.0–46.0)
Hemoglobin: 12.1 g/dL (ref 12.0–15.0)
MCH: 29 pg (ref 26.0–34.0)
MCHC: 31.6 g/dL (ref 30.0–36.0)
MCV: 91.8 fL (ref 78.0–100.0)
PLATELETS: 249 10*3/uL (ref 150–400)
RBC: 4.17 MIL/uL (ref 3.87–5.11)
RDW: 14 % (ref 11.5–15.5)
WBC: 12.4 10*3/uL — ABNORMAL HIGH (ref 4.0–10.5)

## 2016-10-07 MED ORDER — HYDROMORPHONE HCL 1 MG/ML IJ SOLN
1.0000 mg | INTRAMUSCULAR | Status: DC | PRN
  Administered 2016-10-07 – 2016-10-08 (×7): 1 mg via INTRAVENOUS
  Filled 2016-10-07 (×7): qty 1

## 2016-10-07 MED ORDER — SENNOSIDES-DOCUSATE SODIUM 8.6-50 MG PO TABS: 1.0000 | ORAL_TABLET | Freq: Every evening | ORAL | Status: DC | PRN

## 2016-10-07 MED ORDER — POLYETHYLENE GLYCOL 3350 17 G PO PACK
17.0000 g | PACK | Freq: Every day | ORAL | Status: DC
  Administered 2016-10-07: 17 g via ORAL
  Filled 2016-10-07 (×2): qty 1

## 2016-10-07 MED ORDER — CEFAZOLIN SODIUM-DEXTROSE 2-4 GM/100ML-% IV SOLN
2.0000 g | INTRAVENOUS | Status: AC
  Filled 2016-10-07: qty 100

## 2016-10-07 NOTE — Progress Notes (Signed)
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 recently hospitalized from 4/20-4/25 due to T 11 compression fracture. Patient underwent kyphoplasty on 4/24. She had an MRI of lumbar and T-spine 5/15, which showed new T7 compression fracture. taking tramadol and Voltaren get without significant relief, chest x-ray showed possible nondisplaced anterior left seventh rib fracture   Assessment & Plan:  Intractable back pain: T7 compression fracture. -IR consulted to eval for kyphoplasty, planned for tomorrow -increase Dilaudid dose, lidocaine patch, percocet PRN -hold plavix, held since Sunday -OOB  Chronic respiratory failure with O2 dependent COPD: - on chronic prednisone, has osteoporosis. Her prednisone dose was decreased to 10 mg daily recently, but pt had worsening shortness of breath, therefore the dose was adjusted to 15mg daily now.  -stable -continue home dose of prednisone -spiriva and albuterol PRN  Leukocytosis: -likely reactive, monitor  Hypertension  -stable, continue metoprolol  Depression: -stable, on cymbalta  CAD: s/p of stent, BMS.  -stable, continue plavix, metoprolol/imdur/statin  Osteoporosis -defer to PCP  DVT prophylaxis:lovenox held for procedure Code Status: Full Code Family Communication:spouse at bedside Disposition Plan: home pending Kyphoplasty  Consultants:   IR  Antimicrobials:   None   Subjective: Still with severe mid back pain, breathing ok  Objective: Vitals:   10/06/16 2102 10/07/16 0535 10/07/16 0536 10/07/16 0923  BP: (!) 142/68 (!)  149/65 (!) 145/66   Pulse: 98 94 94   Resp: 18 18    Temp: 98.4 F (36.9 C) 98.3 F (36.8 C)    TempSrc:      SpO2: 98% 99%  93%    Intake/Output Summary (Last 24 hours) at 10/07/16 1141 Last data filed at 10/07/16 1053  Gross per 24 hour  Intake              240 ml  Output                0 ml  Net              24 0 ml   There were no vitals filed for this visit.  Examination:  General exam: Appears calm, chroncially ill frail Respiratory system: poor air movement Cardiovascular system: S1 & S2 heard, RRR.  Gastrointestinal system: Abdomen is nondistended, soft and nontender.Normal bowel sounds heard. Central nervous system: Alert and oriented. No focal neurological deficits. Extremities: Symmetric 5 x 5 power. Skin: No rashes, lesions or ulcers Psychiatry: Judgement and insight appear normal. Mood & affect appropriate.     Data Reviewed:   CBC:  Recent Labs Lab 10/06/16 0005 10/07/16 0444  WBC 14.2* 12.4*  NEUTROABS 11.1*  --   HGB 11.1* 12.1  HCT 35.0* 38.3  MCV 90.2 91.8  PLT 214 144   Basic Metabolic Panel:  Recent Labs Lab 10/06/16 0005 10/07/16 0444  NA 140 140  K 3.5 4.0  CL 100* 99*  CO2 30 30  GLUCOSE 79 85  BUN 12 16  CREATININE 0.81 0.97  CALCIUM 8.6* 8.8*   GFR: Estimated Creatinine Clearance: 46.9 mL/min (by C-G formula based on SCr of 0.97 mg/dL). Liver Function Tests: No results for input(s): AST, ALT, ALKPHOS, BILITOT, PROT, ALBUMIN in the last 168 hours.  No results for input(s): LIPASE, AMYLASE in the last 168 hours. No results for input(s): AMMONIA in the last 168 hours. Coagulation Profile:  Recent Labs Lab 10/06/16 0957  INR 0.80   Cardiac Enzymes: No results for input(s): CKTOTAL, CKMB, CKMBINDEX, TROPONINI in the last 168 hours. BNP (last 3 results) No results for input(s): PROBNP in the last 8760 hours. HbA1C: No results for input(s): HGBA1C in the last 72 hours. CBG: No results for input(s): GLUCAP in the last  168 hours. Lipid Profile: No results for input(s): CHOL, HDL, LDLCALC, TRIG, CHOLHDL, LDLDIRECT in the last 72 hours. Thyroid Function Tests: No results for input(s): TSH, T4TOTAL, FREET4, T3FREE, THYROIDAB in the last 72 hours. Anemia Panel: No results for input(s): VITAMINB12, FOLATE, FERRITIN, TIBC, IRON, RETICCTPCT in the last 72 hours. Urine analysis:    Component Value Date/Time   COLORURINE YELLOW 08/01/2015 1050   APPEARANCEUR CLEAR 08/01/2015 1050   LABSPEC <=1.005 (A) 08/01/2015 1050   PHURINE 6.0 08/01/2015 1050   GLUCOSEU NEGATIVE 08/01/2015 1050   HGBUR NEGATIVE 08/01/2015 1050   BILIRUBINUR NEGATIVE 08/01/2015 1050   KETONESUR NEGATIVE 08/01/2015 1050   PROTEINUR NEGATIVE 11/22/2013 2000   UROBILINOGEN 0.2 08/01/2015 1050   NITRITE NEGATIVE 08/01/2015 1050   LEUKOCYTESUR NEGATIVE 08/01/2015 1050   Sepsis Labs: @LABRCNTIP (procalcitonin:4,lacticidven:4)  )No results found for this or any previous visit (from the past 240 hour(s)).       Radiology Studies: Dg Ribs Unilateral W/chest Left  Result Date: 10/06/2016 CLINICAL DATA:  Left rib pain. No known injury. History of osteoporosis. EXAM: LEFT RIBS AND CHEST - 3+ VIEW COMPARISON:  Chest CT 09/10/2016 FINDINGS: Question nondisplaced fracture of anterior left seventh rib. This is seen only on a single view. No displaced fractures. No evidence of acute pulmonary abnormalities such as pneumothorax, consolidation or pleural fluid normal heart size and mediastinal contours. Vertebroplasty within multiple thoracic spine compression fractures. Peripherally calcified bilateral breast implants. IMPRESSION: Possible nondisplaced anterior left seventh rib fracture. No pulmonary complication. Electronically Signed   By: Jeb Levering M.D.   On: 10/06/2016 01:36        Scheduled Meds: . cholecalciferol  1,000 Units Oral QPM  . DULoxetine  30 mg Oral Daily  . [START ON 10/09/2016] enoxaparin (LOVENOX) injection  40 mg  Subcutaneous Q24H  . isosorbide mononitrate  15 mg Oral Daily  . lidocaine  1 patch Transdermal Q24H  . metoprolol tartrate  12.5 mg Oral BID  . polyethylene glycol  17 g Oral Daily  . pravastatin  20 mg Oral q1800  . predniSONE  15 mg Oral Q breakfast  . tiotropium  18 mcg Inhalation Q0600  . vitamin B-12  1,000 mcg Oral Daily  . ascorbic acid  1,000 mg Oral Daily   Continuous Infusions:   LOS: 1 day    Time spent: 98min    Domenic Polite, MD [facility] Pager [phone]  If 7PM-7AM, please contact night-coverage [email or web address] Password [email or web address] 10/07/2016, 11:41 AM

## 2016-10-07 NOTE — Consult Note (Signed)
   [facility] CM Inpatient Consult   10/07/2016  Alexis Higgins 24-Sep-1940 [id number]   Patient assessed for re-admission in the [facility].  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  Per notes patient recently hospitalized from 4/20-4/25 due to T 11 compression fracture. Patient underwent kyphoplasty on 4/24.  Pain admitted with intractable pain. Met with patient and her husband briefly regarding any needs .  Husband was very abrupt and states, "what do you need from [place]."  Explained about [facility] management and patient accepted the brochure 24 hour nurse line magnet with contact information.  Spoke with inpatient RNCM regarding encounter.  No [facility] Care Management needs noted. For questions or changes, please contact:  Natividad Brood, RN BSN [facility] Liaison  [phone] business mobile phone Toll free office [phone]

## 2016-10-07 NOTE — Progress Notes (Signed)
Patient is a moderate fall risk with a score of 13. Patient and patient's family is refusing the bed alarm. Will continue to monitor and treat per MD orders.

## 2016-10-07 NOTE — Progress Notes (Signed)
OT Cancellation Note  Patient Details Name: Alexis Higgins MRN: [id number] DOB: 1941/02/06   Cancelled Treatment:    Reason Eval/Treat Not Completed: Other (comment). Pt declined due to pain and wanting to "wait" until after her procedure. OT signing off at this time. If OT needed after the procedure, please reorder. Thanks  [place], OT/L  [phone] 10/07/2016 10/07/2016, 2:20 PM

## 2016-10-08 ENCOUNTER — Inpatient Hospital Stay (HOSPITAL_COMMUNITY): Payer: Medicare Other

## 2016-10-08 HISTORY — PX: IR VERTEBROPLASTY CERV/THOR BX INC UNI/BIL INC/INJECT/IMAGING: IMG5515

## 2016-10-08 LAB — CBC
HEMATOCRIT: 39.2 % (ref 36.0–46.0)
HEMOGLOBIN: 11.8 g/dL — AB (ref 12.0–15.0)
MCH: 28 pg (ref 26.0–34.0)
MCHC: 30.1 g/dL (ref 30.0–36.0)
MCV: 92.9 fL (ref 78.0–100.0)
Platelets: 250 10*3/uL (ref 150–400)
RBC: 4.22 MIL/uL (ref 3.87–5.11)
RDW: 13.9 % (ref 11.5–15.5)
WBC: 11.7 10*3/uL — AB (ref 4.0–10.5)

## 2016-10-08 LAB — SURGICAL PCR SCREEN
MRSA, PCR: NEGATIVE
STAPHYLOCOCCUS AUREUS: NEGATIVE

## 2016-10-08 MED ORDER — MIDAZOLAM HCL 2 MG/2ML IJ SOLN
INTRAMUSCULAR | Status: AC
Start: 2016-10-08 — End: 2016-10-08
  Administered 2016-10-08: 15:00:00
  Filled 2016-10-08: qty 2

## 2016-10-08 MED ORDER — TOBRAMYCIN SULFATE 1.2 G IJ SOLR
INTRAMUSCULAR | Status: AC
  Filled 2016-10-08: qty 1.2

## 2016-10-08 MED ORDER — MIDAZOLAM HCL 2 MG/2ML IJ SOLN
INTRAMUSCULAR | Status: AC | PRN
  Administered 2016-10-08 (×2): 1 mg via INTRAVENOUS

## 2016-10-08 MED ORDER — BUPIVACAINE HCL (PF) 0.25 % IJ SOLN
INTRAMUSCULAR | Status: AC
  Administered 2016-10-08: 15:00:00
  Filled 2016-10-08: qty 30

## 2016-10-08 MED ORDER — GELATIN ABSORBABLE 12-7 MM EX MISC
CUTANEOUS | Status: AC
  Administered 2016-10-08: 15:00:00
  Filled 2016-10-08: qty 1

## 2016-10-08 MED ORDER — IOPAMIDOL (ISOVUE-300) INJECTION 61%
INTRAVENOUS | Status: AC
  Administered 2016-10-08: 7 mL
  Filled 2016-10-08: qty 50

## 2016-10-08 MED ORDER — FENTANYL CITRATE (PF) 100 MCG/2ML IJ SOLN
INTRAMUSCULAR | Status: AC | PRN
  Administered 2016-10-08 (×2): 25 ug via INTRAVENOUS

## 2016-10-08 MED ORDER — TOBRAMYCIN SULFATE 1.2 G IJ SOLR
INTRAMUSCULAR | Status: AC
  Administered 2016-10-08: 15:00:00
  Filled 2016-10-08: qty 1.2

## 2016-10-08 MED ORDER — FENTANYL CITRATE (PF) 100 MCG/2ML IJ SOLN
INTRAMUSCULAR | Status: AC
  Administered 2016-10-08: 15:00:00
  Filled 2016-10-08: qty 2

## 2016-10-08 MED ORDER — SODIUM CHLORIDE 0.9 % IV SOLN
INTRAVENOUS | Status: AC
  Administered 2016-10-08: 16:00:00 via INTRAVENOUS

## 2016-10-08 NOTE — Procedures (Signed)
S/P T 7 VP 

## 2016-10-08 NOTE — Progress Notes (Signed)
Patient called for pain medicine approximately 2am. I was busy at that time, Renita Papa said she would administer medication. When Pam entered the room, they wanted to talk to me. They called me on the phone. Upon entering the room, husband said "you didn't keep your word" I had told patient I would wake her at 25 (she and spouse were sleeping at the time) Spouse was angry, patient was angry. I apologized and told them I should have set my alarm. I apologized several times. Patient remains angry.

## 2016-10-08 NOTE — Sedation Documentation (Signed)
Patient is resting comfortably. 

## 2016-10-08 NOTE — Discharge Instructions (Signed)
1.No stooping,bending or lifting more than 10 lbs for 2 weeks. °2.Use walker to ambulate for 2 weeks. °3.RTC in 2 weeks. °

## 2016-10-08 NOTE — Progress Notes (Signed)
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 recently hospitalized from 4/20-4/25 due to T 11 compression fracture. Patient underwent kyphoplasty on 4/24. She had an MRI of lumbar and T-spine 5/15, which showed new T7 compression fracture. taking tramadol and Voltaren get without significant relief, chest x-ray showed possible nondisplaced anterior left seventh rib fracture  Assessment & Plan:  Intractable back pain: T7 compression fracture. -IR consulted to eval for kyphoplasty, planned for this afternoon -continue Dilaudid dose, lidocaine patch, percocet PRN -plavix on hold since Sunday -PT eval tomorrow  Chronic respiratory failure with O2 dependent COPD: - on chronic prednisone, has osteoporosis. Her prednisone dose was decreased to 10 mg daily recently, but pt had worsening shortness of breath, therefore the dose was adjusted to 15mg daily now.  -stable -continue home dose of prednisone -spiriva and albuterol PRN  Leukocytosis: -likely reactive, monitor  Hypertension  -stable, continue metoprolol  Depression: -stable, on cymbalta  CAD: s/p of stent, BMS.  -stable, continue plavix, metoprolol/imdur/statin  Osteoporosis -defer to PCP  DVT prophylaxis:lovenox held for procedure Code Status: Full Code Family Communication:spouse at bedside Disposition Plan: home tomorrow pending Kyphoplasty  Consultants:   IR  Antimicrobials:   None   Subjective: Pain finally improving, patch helping  Objective: Vitals:   10/07/16 1408 10/07/16 2154 10/08/16 0531 10/08/16 0841  BP:  131/65 (!) 143/75 134/67   Pulse: (!) 101 (!) 108 93   Resp: 18 18 18   Temp: 98.1 F (36.7 C) 98.5 F (36.9 C) 98.4 F (36.9 C)   TempSrc: Oral Oral Oral   SpO2: 96% 94% 95% 95%    Intake/Output Summary (Last 24 hours) at 10/08/16 1131 Last data filed at 10/07/16 1330  Gross per 24 hour  Intake              240 ml  Output                0 ml  Net              24 0 ml   There were no vitals filed for this visit.  Examination:  General exam:  chroncially ill frail, no distress Respiratory system: improved air movement, no wheezes Cardiovascular system: S1 & S2 heard, RRR.  Gastrointestinal system: Abdomen is nondistended, soft, NT, BS present Central nervous system: Alert and oriented. No focal neurological deficits. Extremities: Symmetric 5 x 5 power. Skin: No rashes, lesions or ulcers Psychiatry: Judgement and insight appear normal. Mood & affect appropriate.     Data Reviewed:   CBC:  Recent Labs Lab 10/06/16 0005 10/07/16 0444 10/08/16 0617  WBC 14.2* 12.4* 11.7*  NEUTROABS 11.1*  --   --   HGB 11.1* 12.1 11.8*  HCT 35.0* 38.3 39.2  MCV 90.2 91.8 92.9  PLT 214 249 702   Basic Metabolic Panel:  Recent Labs Lab 10/06/16 0005 10/07/16 0444  NA 140 140  K 3.5 4.0  CL 100* 99*  CO2 30 30  GLUCOSE 79 85  BUN 12 16  CREATININE 0.81 0.97  CALCIUM 8.6* 8.8*   GFR: Estimated Creatinine Clearance: 46.9 mL/min (by C-G formula based on SCr of 0.97 mg/dL). Liver Function Tests: No  results for input(s): AST, ALT, ALKPHOS, BILITOT, PROT, ALBUMIN in the last 168 hours. No results for input(s): LIPASE, AMYLASE in the last 168 hours. No results for input(s): AMMONIA in the last 168 hours. Coagulation Profile:  Recent Labs Lab 10/06/16 0957  INR 0.80   Cardiac Enzymes: No results for input(s): CKTOTAL, CKMB, CKMBINDEX, TROPONINI in the last 168 hours. BNP (last 3 results) No results for input(s): PROBNP in the last 8760 hours. HbA1C: No results for  input(s): HGBA1C in the last 72 hours. CBG: No results for input(s): GLUCAP in the last 168 hours. Lipid Profile: No results for input(s): CHOL, HDL, LDLCALC, TRIG, CHOLHDL, LDLDIRECT in the last 72 hours. Thyroid Function Tests: No results for input(s): TSH, T4TOTAL, FREET4, T3FREE, THYROIDAB in the last 72 hours. Anemia Panel: No results for input(s): VITAMINB12, FOLATE, FERRITIN, TIBC, IRON, RETICCTPCT in the last 72 hours. Urine analysis:    Component Value Date/Time   COLORURINE YELLOW 08/01/2015 1050   APPEARANCEUR CLEAR 08/01/2015 1050   LABSPEC <=1.005 (A) 08/01/2015 1050   PHURINE 6.0 08/01/2015 1050   GLUCOSEU NEGATIVE 08/01/2015 1050   HGBUR NEGATIVE 08/01/2015 1050   [place] 08/01/2015 1050   KETONESUR NEGATIVE 08/01/2015 1050   PROTEINUR NEGATIVE 11/22/2013 2000   UROBILINOGEN 0.2 08/01/2015 1050   NITRITE NEGATIVE 08/01/2015 1050   LEUKOCYTESUR NEGATIVE 08/01/2015 1050   Sepsis Labs: @LABRCNTIP (procalcitonin:4,lacticidven:4)  ) Recent Results (from the past 240 hour(s))  Surgical pcr screen     Status: None   Collection Time: 10/08/16  4:12 AM  Result Value Ref Range Status   MRSA, PCR NEGATIVE NEGATIVE Final   Staphylococcus aureus NEGATIVE NEGATIVE Final    Comment:        The Xpert SA Assay (FDA approved for NASAL specimens in patients over 7 years of age), is one component of a comprehensive surveillance program.  Test performance has been validated by [facility] for patients greater than or equal to 24 year old. It is not intended to diagnose infection nor to guide or monitor treatment.          Radiology Studies: No results found.      Scheduled Meds: . cholecalciferol  1,000 Units Oral QPM  . DULoxetine  30 mg Oral Daily  . [START ON 10/09/2016] enoxaparin (LOVENOX) injection  40 mg Subcutaneous Q24H  . isosorbide mononitrate  15 mg Oral Daily  . lidocaine  1 patch Transdermal Q24H  . metoprolol tartrate  12.5 mg  Oral BID  . polyethylene glycol  17 g Oral Daily  . pravastatin  20 mg Oral q1800  . predniSONE  15 mg Oral Q breakfast  . tiotropium  18 mcg Inhalation Q0600  . vitamin B-12  1,000 mcg Oral Daily  . ascorbic acid  1,000 mg Oral Daily   Continuous Infusions: .  ceFAZolin (ANCEF) IV       LOS: 2 days    Time spent: 36min    Domenic Polite, MD [facility] Pager [phone]  If 7PM-7AM, please contact night-coverage [email or web address] Password [facility] 10/08/2016, 11:31 AM

## 2016-10-08 NOTE — Sedation Documentation (Signed)
CO2 values and waveform unreliable.

## 2016-10-09 DIAGNOSIS — M546 Pain in thoracic spine: Secondary | ICD-10-CM

## 2016-10-09 MED ORDER — HYDROCODONE-ACETAMINOPHEN 5-325 MG PO TABS: 1.0000 | ORAL_TABLET | Freq: Four times a day (QID) | ORAL | 0 refills | Status: DC | PRN

## 2016-10-09 NOTE — Discharge Summary (Signed)
Physician Discharge Summary  MADELON WELSCH PFX:[id number] DOB: [birth year] DOA: 10/05/2016  PCP: Biagio Borg, MD  Admit date: 10/05/2016 Discharge date: 10/09/2016  Time spent: minutes  Recommendations for Outpatient Follow-up:  1. Recommend Endocrine referral for severe Osteoporosis 2. Dr.Byrum in 2-3weeks, wean prednisone dose down as tolerated now on 15mg  daily   Discharge Diagnoses:  Principal Problem:   Intractable back pain   T7 compression fracture   Severe Osteoporosis   Hyperlipidemia   Depression   Essential hypertension   COPD (chronic obstructive pulmonary disease) with emphysema (HCC)   CAD (coronary artery disease)   Chronic respiratory failure (HCC)   Closed compression fracture of thoracic vertebra (HCC)   Back pain   Fracture of seventh thoracic vertebra [facility])   Discharge Condition: stable  Diet recommendation:heart healthy  Filed Weights   10/09/16 0007  Weight: 67.1 kg (148 lb)    History of present illness:  LICHELLE VIETS a 76 y.o.femalewith medical history significant of hypertension, hyperlipidemia, COPD on chronic steroid, depression, anxiety, TIA, chronic back pain, CAD, stent placement, osteoporosis secondary to steroid use, h/o multiple vertebral fractures, who presentedwith intractable back pain. recently hospitalized from 4/20-4/25 due to T 11 compression fracture. Patient underwent kyphoplasty on 4/24. She had an MRI of lumbar and T-spine 5/15, which showed new T7 compression fracture. taking tramadol and Voltaren get without significant relief, chest x-ray showed possible nondisplaced anterior left seventh rib fracture.  Hospital Course:    Intractable back pain / T7 compression fracture. -IR consulted  for kyphoplasty, this was completed yesterday afternoon -clinically much improved, anxious to go home today, plavix resumed -discharged on hydrocodone and home PT resumed  Chronic respiratory failure with O2 dependent  COPD: -on chronic prednisone, has osteoporosis. Her prednisone dose was decreased to 10 mg daily recently, but pt had worsening shortness of breath, therefore the dose was adjusted to 15mg  daily now.  -stable -continue home dose of prednisone -spiriva and albuterol PRN  Leukocytosis: -reactive, no fevers  Hypertension  -stable, continue metoprolol  Depression: -stable, on cymbalta  CAD: s/p of stent, BMS.  -stable, continue plavix, metoprolol/imdur/statin  Osteoporosis -defer to PCP, recommend endocrine referral  Procedures:  IR   Consultations:  IR Dr.Deweshwar  Discharge Exam: Vitals:   10/08/16 2128 10/09/16 0544  BP: 138/73 135/73  Pulse: (!) 105 94  Resp: 12 16  Temp: 98 F (36.7 C) 98.2 F (36.8 C)    General: AAOx3 Cardiovascular: S1S2/RRR Respiratory: CTAB  Discharge Instructions   Discharge Instructions    Diet - low sodium heart healthy    Complete by:  As directed    Increase activity slowly    Complete by:  As directed      Current Discharge Medication List    START taking these medications   Details  HYDROcodone-acetaminophen (NORCO/VICODIN) 5-325 MG tablet Take 1-2 tablets by mouth every 6 (six) hours as needed for moderate pain. Qty: 30 tablet, Refills: 0      CONTINUE these medications which have NOT CHANGED   Details  ascorbic acid (VITAMIN C) 1000 MG tablet Take 1,000 mg by mouth daily.    Cholecalciferol (VITAMIN D) 1000 UNITS capsule Take 1,000 Units by mouth every evening.     diclofenac sodium (VOLTAREN) 1 % GEL Apply 4 g topically 4 (four) times daily. Qty: 100 g, Refills: 0    docusate sodium (COLACE) 100 MG capsule Take 100 mg by mouth daily as needed. For constipation    DULoxetine (CYMBALTA) 30  MG capsule Take 1 capsule (30 mg total) by mouth daily. Qty: 90 capsule, Refills: 3    furosemide (LASIX) 20 MG tablet Take 1 tablet (20 mg total) by mouth daily. Qty: 90 tablet, Refills: 1    isosorbide  mononitrate (IMDUR) 30 MG 24 hr tablet TAKE 0.5 TABLETS (15 MG TOTAL) BY MOUTH DAILY. Qty: 45 tablet, Refills: 3    lovastatin (MEVACOR) 20 MG tablet TAKE 1 TABLET BY MOUTH AT BEDTIME Qty: 90 tablet, Refills: 3    metoprolol tartrate (LOPRESSOR) 25 MG tablet TAKE 1/2 TABLET BY MOUTH 2 TIMES A DAY Refills: 11    nitroGLYCERIN (NITROSTAT) 0.4 MG SL tablet Place 1 tablet (0.4 mg total) under the tongue every 5 (five) minutes as needed for chest pain (up to 3 doses). Qty: 25 tablet, Refills: 4    ondansetron (ZOFRAN) 4 MG tablet Take 1 tablet (4 mg total) by mouth every 8 (eight) hours as needed for nausea or vomiting. Qty: 11 tablet, Refills: 0    polyethylene glycol (MIRALAX / GLYCOLAX) packet Take 17 g by mouth daily as needed for mild constipation.    polyvinyl alcohol (LIQUIFILM TEARS) 1.4 % ophthalmic solution Place 1 drop into both eyes daily as needed for dry eyes.     predniSONE (DELTASONE) 10 MG tablet Take 15 mg by mouth daily with breakfast.    PROAIR HFA 108 (90 Base) MCG/ACT inhaler INHALE 2 PUFFS INTO THE LUNGS EVERY 6 (SIX) HOURS AS NEEDED FOR WHEEZING OR SHORTNESS OF BREATH. Qty: 25.5 Inhaler, Refills: 2    tiotropium (SPIRIVA HANDIHALER) 18 MCG inhalation capsule Place 1 capsule (18 mcg total) into inhaler and inhale daily at 6 (six) AM. Qty: 90 capsule, Refills: 3    traMADol (ULTRAM) 50 MG tablet Take 100 mg by mouth 2 (two) times daily.     vitamin B-12 (CYANOCOBALAMIN) 1000 MCG tablet Take 1,000 mcg by mouth daily.    clopidogrel (PLAVIX) 75 MG tablet TAKE 1 TABLET (75 MG TOTAL) BY MOUTH DAILY. Qty: 30 tablet, Refills: 11      STOP taking these medications     lidocaine (LIDODERM) 5 %      LORazepam (ATIVAN) 0.5 MG tablet        Allergies  Allergen Reactions  . Aspirin Other (See Comments)     cns bleed risk  . Codeine Anaphylaxis and Rash  . Other Other (See Comments)    Stiolto - severe reaction - caused inability to breath      The results of  significant diagnostics from this hospitalization (including imaging, microbiology, ancillary and laboratory) are listed below for reference.    Significant Diagnostic Studies: Dg Ribs Unilateral W/chest Left  Result Date: 10/06/2016 CLINICAL DATA:  Left rib pain. No known injury. History of osteoporosis. EXAM: LEFT RIBS AND CHEST - 3+ VIEW COMPARISON:  Chest CT 09/10/2016 FINDINGS: Question nondisplaced fracture of anterior left seventh rib. This is seen only on a single view. No displaced fractures. No evidence of acute pulmonary abnormalities such as pneumothorax, consolidation or pleural fluid normal heart size and mediastinal contours. Vertebroplasty within multiple thoracic spine compression fractures. Peripherally calcified bilateral breast implants. IMPRESSION: Possible nondisplaced anterior left seventh rib fracture. No pulmonary complication. Electronically Signed   By: Jeb Levering M.D.   On: 10/06/2016 01:36   Dg Thoracic Spine 2 View  Result Date: 09/22/2016 CLINICAL DATA:  Dorsalgia EXAM: THORACIC SPINE 2 VIEWS COMPARISON:  Chest CT with bony reformats September 10, 2016 FINDINGS: Frontal and lateral views  obtained. Patient is undergone kyphoplasty procedures at T11, T10, and T5. There is stable anterior wedging at T6. There is no new fracture. No spondylolisthesis. No appreciable extravasation of contrast from the respective vertebral bodies. There is disc space narrowing at several levels consistent with osteoarthritic change. There is no erosive change or paraspinous lesion. IMPRESSION: Several prior anterior wedge compression fractures, unchanged. Status post kyphoplasty procedures at several levels. Note that the T11 kyphoplasty is new. No new fracture. No spondylolisthesis. Osteoarthritic changes several levels. Electronically Signed   By: Lowella Grip III M.D.   On: 09/22/2016 10:06   Ct Angio Chest Pe W And/or Wo Contrast  Result Date: 09/10/2016 CLINICAL DATA:  Sternal chest  pain, back pain, known compression fractures. EXAM: CT ANGIOGRAPHY CHEST WITH CONTRAST TECHNIQUE: Multidetector CT imaging of the chest was performed using the standard protocol during bolus administration of intravenous contrast. Multiplanar CT image reconstructions and MIPs were obtained to evaluate the vascular anatomy. CONTRAST:  100 cc Isovue 370 IV COMPARISON:  Thoracic spine MRI 09/07/2016, chest radiograph 06/30/2016. Chest CT 01/06/2016 FINDINGS: Cardiovascular: No aortic dissection or aneurysm, mild aortic atherosclerosis. The heart is normal in size. There are no filling defects within the pulmonary arteries to suggest pulmonary embolus. Coronary artery calcifications versus stents. Mediastinum/Nodes: No mediastinal or hilar adenopathy. Calcified right thyroid nodule is again seen. The esophagus is decompressed. Lungs/Pleura: Advanced emphysema. Mild central bronchial thickening. Previous 3 mm pleural-based nodule is grossly stable, image 48 series 6, less well-defined currently. No new pulmonary nodule. No consolidation. No pulmonary edema. No pneumothorax or pleural fluid. Upper Abdomen: Ingested material within the stomach. No acute abnormality. Musculoskeletal: T10 and T5 vertebral compression fractures with augmentation. T11 compression fracture involving superior endplate, characterized on recent MRI. Sternum is intact. No acute osseous abnormality. Review of the MIP images confirms the above findings. IMPRESSION: 1. No pulmonary embolus. 2. Advanced emphysema.  Central bronchial thickening is chronic. 3. Mild aortic atherosclerosis.  Coronary artery calcifications. 4. Thoracic spine compression fractures, T10 and T5 treated with vertebral augmentation. T11 compression fracture, as characterized on recent MRI. Electronically Signed   By: Jeb Levering M.D.   On: 09/10/2016 03:52   Mr Thoracic Spine Wo Contrast  Result Date: 10/05/2016 CLINICAL DATA:  76 year old female with severe  thoracolumbar back pain radiating to the left side for 1 week. Previously augmented multilevel spinal compression fractures. EXAM: MRI THORACIC AND LUMBAR SPINE WITHOUT CONTRAST TECHNIQUE: Multiplanar and multiecho pulse sequences of the thoracic and lumbar spine were obtained without intravenous contrast. COMPARISON:  Images from T11 kyphoplasty 09/14/2016. Thoracic spine MRI 09/07/2016, and earlier. Lumbar MRI 05/21/2009. FINDINGS: MRI THORACIC SPINE FINDINGS Limited sagittal imaging of the cervical spine: Stable compared to 09/07/2016. Thoracic segmentation:  Normal. Alignment:  Stable. Vertebrae: Interval augmentation of the T11 mild compression fracture with regressed marrow edema at that level. Chronic previously augmented T5 and T10 compression fractures. Chronic T6 compression fracture. New mild T7 superior endplate compression fracture with mild to moderate vertebral body marrow edema. Slight retropulsion of the posterosuperior endplate. 35% loss of T7 vertebral body height. No associated spinal or foraminal stenosis. T7 posterior elements appear intact. Chronic right T9 pedicle STIR hyperintensity is unchanged and felt related to benign hemangioma. No other acute osseous abnormality identified. Cord: Remains normal. Capacious thoracic spinal canal. Normal conus medullaris at T12-L1. Paraspinal and other soft tissues: Negative visualized thoracic and upper abdominal viscera. Mild posterior thoracic subcutaneous edema. Disc levels: Fairly capacious thoracic spinal canal. Borderline to mild multifactorial spinal  stenosis at T10-T11 is stable. MRI LUMBAR SPINE FINDINGS Segmentation:  Normal. Alignment:  Stable since 2010. Vertebrae: No marrow edema or evidence of acute osseous abnormality. Visualized bone marrow signal is within normal limits. Visible sacrum and SI joints are intact. Conus medullaris: Extends to the T12-L1 level and appears normal. Paraspinal and other soft tissues: Stable and negative. Disc  levels: Stable since 2010. Capacious lumbar spinal canal. Chronic postoperative changes to the right lamina at L4-L5. No lumbar spinal stenosis or significant foraminal stenosis. IMPRESSION: MR THORACIC SPINE IMPRESSION 1. Acute to subacute T7 compression fracture with marrow edema and 35% loss of vertebral body height. Minimal retropulsion of the posterosuperior endplate. No associated spinal stenosis or other complicating features. 2. Interval T11 vertebral augmentation. Chronic T5, T6, and T10 compression fractures. MR LUMBAR SPINE IMPRESSION No acute findings. Stable MRI appearance of the lumbar spine since 2010. Electronically Signed   By: Genevie Ann M.D.   On: 10/05/2016 09:16   Mr Lumbar Spine Wo Contrast  Result Date: 10/05/2016 CLINICAL DATA:  76 year old female with severe thoracolumbar back pain radiating to the left side for 1 week. Previously augmented multilevel spinal compression fractures. EXAM: MRI THORACIC AND LUMBAR SPINE WITHOUT CONTRAST TECHNIQUE: Multiplanar and multiecho pulse sequences of the thoracic and lumbar spine were obtained without intravenous contrast. COMPARISON:  Images from T11 kyphoplasty 09/14/2016. Thoracic spine MRI 09/07/2016, and earlier. Lumbar MRI 05/21/2009. FINDINGS: MRI THORACIC SPINE FINDINGS Limited sagittal imaging of the cervical spine: Stable compared to 09/07/2016. Thoracic segmentation:  Normal. Alignment:  Stable. Vertebrae: Interval augmentation of the T11 mild compression fracture with regressed marrow edema at that level. Chronic previously augmented T5 and T10 compression fractures. Chronic T6 compression fracture. New mild T7 superior endplate compression fracture with mild to moderate vertebral body marrow edema. Slight retropulsion of the posterosuperior endplate. 35% loss of T7 vertebral body height. No associated spinal or foraminal stenosis. T7 posterior elements appear intact. Chronic right T9 pedicle STIR hyperintensity is unchanged and felt related  to benign hemangioma. No other acute osseous abnormality identified. Cord: Remains normal. Capacious thoracic spinal canal. Normal conus medullaris at T12-L1. Paraspinal and other soft tissues: Negative visualized thoracic and upper abdominal viscera. Mild posterior thoracic subcutaneous edema. Disc levels: Fairly capacious thoracic spinal canal. Borderline to mild multifactorial spinal stenosis at T10-T11 is stable. MRI LUMBAR SPINE FINDINGS Segmentation:  Normal. Alignment:  Stable since 2010. Vertebrae: No marrow edema or evidence of acute osseous abnormality. Visualized bone marrow signal is within normal limits. Visible sacrum and SI joints are intact. Conus medullaris: Extends to the T12-L1 level and appears normal. Paraspinal and other soft tissues: Stable and negative. Disc levels: Stable since 2010. Capacious lumbar spinal canal. Chronic postoperative changes to the right lamina at L4-L5. No lumbar spinal stenosis or significant foraminal stenosis. IMPRESSION: MR THORACIC SPINE IMPRESSION 1. Acute to subacute T7 compression fracture with marrow edema and 35% loss of vertebral body height. Minimal retropulsion of the posterosuperior endplate. No associated spinal stenosis or other complicating features. 2. Interval T11 vertebral augmentation. Chronic T5, T6, and T10 compression fractures. MR LUMBAR SPINE IMPRESSION No acute findings. Stable MRI appearance of the lumbar spine since 2010. Electronically Signed   By: Genevie Ann M.D.   On: 10/05/2016 09:16   Ir Kypho Ea Addl Level Thoracic Or Lumbar  Result Date: 09/14/2016 INDICATION: 76 year old female with new T11 compression fracture and lifestyle limiting 10 out of 10 pain. EXAM: IR KYPHO VERTEBRAL ADDL AGMENTATION COMPARISON:  MRI 09/07/2016, CT 09/10/2016 MEDICATIONS:  As antibiotic prophylaxis, 2.0 g Ancef was ordered pre-procedure and administered intravenously within 1 hour of incision. ANESTHESIA/SEDATION: Moderate (conscious) sedation was employed  during this procedure. A total of Versed 2.0 mg and Fentanyl 100 mcg, 1 mg Dilaudid was administered intravenously. Moderate Sedation Time: 30 minutes. The patient's level of consciousness and vital signs were monitored continuously by radiology nursing throughout the procedure under my direct supervision. FLUOROSCOPY TIME:  Fluoroscopy Time: 5 minutes 12 seconds (59 mGy) COMPLICATIONS: None PROCEDURE: Following a full explanation of the procedure along with the potentially associated complications, a witnessed informed consent was obtained. Specific risks that were discussed included bleeding, infection, injury to adjacent structures, neurologic injury, embolization of cement within the veins, failure of the procedure to improve pain, need for further procedure/ surgery, cardiopulmonary collapse, death. The patient understands the risks and wishes to proceed. The patient was placed prone on the fluoroscopic table. Nasal oxygen was administered. Physiologic monitoring was performed throughout the duration of the procedure. The skin overlying the T11 region was prepped and draped in the usual sterile fashion. The T11 vertebral body was identified and 1% lidocaine was used to anesthetize the skin overlying the right pedicle to the level of the bone. A small incision was made with 11 blade scalpel, and then a Medtronic 8 gauge needle was advanced to the bone adjacent to the right pedicle via extra pedicular approach into the anterior 1/3 of the vertebral body. Inflation of the right pedicular cannula balloon was performed under fluoroscopic observation, with central balloon location within the vertebral body. Methylmethacrylate mixture was then reconstituted. Under biplane intermittent fluoroscopy, the methylmethacrylate was then hand injected into the cavity at T11 vertebral body with filling of the fracture cleft. No extravasation was noted posteriorly into the spinal canal. No epidural venous contamination was seen.  The cannula was then removed. Hemostasis was achieved at the skin entry site. There were no acute complications. Patient tolerated the procedure well. The patient was observed for 30 minutes and returned to her room in good condition. IMPRESSION: Status post right sided extra pedicular approach for kyphoplasty treatment of new T11 compression fracture. Signed, Dulcy Fanny. Earleen Newport, DO [facility] Radiology Electronically Signed   By: Corrie Mckusick D.O.   On: 09/14/2016 13:32    Microbiology: Recent Results (from the past 240 hour(s))  Surgical pcr screen     Status: None   Collection Time: 10/08/16  4:12 AM  Result Value Ref Range Status   MRSA, PCR NEGATIVE NEGATIVE Final   Staphylococcus aureus NEGATIVE NEGATIVE Final    Comment:        The Xpert SA Assay (FDA approved for NASAL specimens in patients over 26 years of age), is one component of a comprehensive surveillance program.  Test performance has been validated by [facility] for patients greater than or equal to 57 year old. It is not intended to diagnose infection nor to guide or monitor treatment.      Labs: Basic Metabolic Panel:  Recent Labs Lab 10/06/16 0005 10/07/16 0444  NA 140 140  K 3.5 4.0  CL 100* 99*  CO2 30 30  GLUCOSE 79 85  BUN 12 16  CREATININE 0.81 0.97  CALCIUM 8.6* 8.8*   Liver Function Tests: No results for input(s): AST, ALT, ALKPHOS, BILITOT, PROT, ALBUMIN in the last 168 hours. No results for input(s): LIPASE, AMYLASE in the last 168 hours. No results for input(s): AMMONIA in the last 168 hours. CBC:  Recent  Labs Lab 10/06/16 0005 10/07/16 0444 10/08/16 0617  WBC 14.2* 12.4* 11.7*  NEUTROABS 11.1*  --   --   HGB 11.1* 12.1 11.8*  HCT 35.0* 38.3 39.2  MCV 90.2 91.8 92.9  PLT 214 249 250   Cardiac Enzymes: No results for input(s): CKTOTAL, CKMB, CKMBINDEX, TROPONINI in the last 168 hours. BNP: BNP (last 3 results) No results for  input(s): BNP in the last 8760 hours.  ProBNP (last 3 results) No results for input(s): PROBNP in the last 8760 hours.  CBG: No results for input(s): GLUCAP in the last 168 hours.     SignedDomenic Polite MD.  [facility] 10/09/2016, 2:19 PM

## 2016-10-09 NOTE — Progress Notes (Signed)
Nsg Discharge Note  Admit Date:  10/05/2016 Discharge date: 10/09/2016   Alexis Higgins to be D/C'd Home per MD order.  AVS completed.  Copy for chart, and copy for patient signed, and dated. Patient/caregiver able to verbalize understanding.  Discharge Medication: Allergies as of 10/09/2016      Reactions   Aspirin Other (See Comments)    cns bleed risk   Codeine Anaphylaxis, Rash   Other Other (See Comments)   Stiolto - severe reaction - caused inability to breath      Medication List    STOP taking these medications   lidocaine 5 % Commonly known as:  LIDODERM   LORazepam 0.5 MG tablet Commonly known as:  ATIVAN     TAKE these medications   ascorbic acid 1000 MG tablet Commonly known as:  VITAMIN C Take 1,000 mg by mouth daily.   clopidogrel 75 MG tablet Commonly known as:  PLAVIX TAKE 1 TABLET (75 MG TOTAL) BY MOUTH DAILY.   diclofenac sodium 1 % Gel Commonly known as:  VOLTAREN Apply 4 g topically 4 (four) times daily.   docusate sodium 100 MG capsule Commonly known as:  COLACE Take 100 mg by mouth daily as needed. For constipation   DULoxetine 30 MG capsule Commonly known as:  CYMBALTA Take 1 capsule (30 mg total) by mouth daily.   furosemide 20 MG tablet Commonly known as:  LASIX Take 1 tablet (20 mg total) by mouth daily.   HYDROcodone-acetaminophen 5-325 MG tablet Commonly known as:  NORCO/VICODIN Take 1-2 tablets by mouth every 6 (six) hours as needed for moderate pain.   isosorbide mononitrate 30 MG 24 hr tablet Commonly known as:  IMDUR TAKE 0.5 TABLETS (15 MG TOTAL) BY MOUTH DAILY.   lovastatin 20 MG tablet Commonly known as:  MEVACOR TAKE 1 TABLET BY MOUTH AT BEDTIME   metoprolol tartrate 25 MG tablet Commonly known as:  LOPRESSOR TAKE 1/2 TABLET BY MOUTH 2 TIMES A DAY   nitroGLYCERIN 0.4 MG SL tablet Commonly known as:  NITROSTAT Place 1 tablet (0.4 mg total) under the tongue every 5 (five) minutes as needed for chest pain (up to 3  doses).   ondansetron 4 MG tablet Commonly known as:  ZOFRAN Take 1 tablet (4 mg total) by mouth every 8 (eight) hours as needed for nausea or vomiting. What changed:  when to take this   polyethylene glycol packet Commonly known as:  MIRALAX / GLYCOLAX Take 17 g by mouth daily as needed for mild constipation.   polyvinyl alcohol 1.4 % ophthalmic solution Commonly known as:  LIQUIFILM TEARS Place 1 drop into both eyes daily as needed for dry eyes.   predniSONE 10 MG tablet Commonly known as:  DELTASONE Take 15 mg by mouth daily with breakfast.   PROAIR HFA 108 (90 Base) MCG/ACT inhaler Generic drug:  albuterol INHALE 2 PUFFS INTO THE LUNGS EVERY 6 (SIX) HOURS AS NEEDED FOR WHEEZING OR SHORTNESS OF BREATH.   tiotropium 18 MCG inhalation capsule Commonly known as:  SPIRIVA HANDIHALER Place 1 capsule (18 mcg total) into inhaler and inhale daily at 6 (six) AM.   traMADol 50 MG tablet Commonly known as:  ULTRAM Take 100 mg by mouth 2 (two) times daily.   vitamin B-12 1000 MCG tablet Commonly known as:  CYANOCOBALAMIN Take 1,000 mcg by mouth daily.   Vitamin D 1000 units capsule Take 1,000 Units by mouth every evening.       Discharge Assessment: Vitals:  10/08/16 2128 10/09/16 0544  BP: 138/73 135/73  Pulse: (!) 105 94  Resp: 12 16  Temp: 98 F (36.7 C) 98.2 F (36.8 C)   Skin clean, dry and intact without evidence of skin break down, no evidence of skin tears noted. IV catheter discontinued intact. Site without signs and symptoms of complications - no redness or edema noted at insertion site, patient denies c/o pain - only slight tenderness at site.  Dressing with slight pressure applied.  D/c Instructions-Education: Discharge instructions given to patient/family with verbalized understanding. D/c education completed with patient/family including follow up instructions, medication list, d/c activities limitations if indicated, with other d/c instructions as  indicated by MD - patient able to verbalize understanding, all questions fully answered. Patient instructed to return to ED, call 911, or call MD for any changes in condition.  Patient escorted via [place], and D/C home via private auto.  Salley Slaughter, RN 10/09/2016 11:14 AM

## 2016-10-09 NOTE — Progress Notes (Signed)
CM left message with Butch Penny, clinical liaison for [facility] to notify that patient [place] today and will resume services. No other CM needs.

## 2016-10-12 ENCOUNTER — Encounter: Payer: Self-pay | Admitting: Internal Medicine

## 2016-10-12 ENCOUNTER — Encounter (HOSPITAL_COMMUNITY): Payer: Self-pay | Admitting: Interventional Radiology

## 2016-10-14 DIAGNOSIS — J449 Chronic obstructive pulmonary disease, unspecified: Secondary | ICD-10-CM | POA: Diagnosis not present

## 2016-10-15 ENCOUNTER — Encounter: Payer: Self-pay | Admitting: Emergency Medicine

## 2016-10-15 NOTE — Telephone Encounter (Signed)
From: Rosey Bath. 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 She had x-rays made in [place] 10/05/16 and she was admitted again for pain management until her procedure. We did not find out until yesterday that the x-rays showed she had a broken 7th left rib which is what she is suffering extreme pain from then & now. She was discharged on Saturday 10/09/16 once again with only pain meds relating to her back procedure. No one informed us once again about the rib fracture and how to deal with it. She has tolerated the reduction to 15mg  dosage of prednisone very well but as you can see she is very brittle. Do you think we could reduce her dosage to 10mg  again and see if she can tolerate it now. We need to do something if possible to help her bones strengthen. Thank you, Bing Matter

## 2016-10-17 ENCOUNTER — Other Ambulatory Visit: Payer: Self-pay | Admitting: Internal Medicine

## 2016-10-17 DIAGNOSIS — J449 Chronic obstructive pulmonary disease, unspecified: Secondary | ICD-10-CM | POA: Diagnosis not present

## 2016-10-19 ENCOUNTER — Encounter: Payer: Self-pay | Admitting: Internal Medicine

## 2016-10-19 ENCOUNTER — Encounter: Payer: Self-pay | Admitting: Emergency Medicine

## 2016-10-19 ENCOUNTER — Other Ambulatory Visit: Payer: Self-pay | Admitting: Internal Medicine

## 2016-10-19 MED ORDER — DICLOFENAC SODIUM 1 % TD GEL: 4.0000 g | Freq: Four times a day (QID) | TRANSDERMAL | 0 refills | Status: DC

## 2016-10-19 MED ORDER — DICLOFENAC SODIUM 1 % TD GEL: 4.0000 g | Freq: Four times a day (QID) | TRANSDERMAL | 3 refills | Status: DC

## 2016-10-19 MED ORDER — TRAMADOL HCL 50 MG PO TABS: 50.0000 mg | ORAL_TABLET | Freq: Three times a day (TID) | ORAL | 0 refills | Status: DC | PRN

## 2016-10-19 NOTE — Telephone Encounter (Signed)
.  Done hardcopy to [place], pt with recent broken ribs

## 2016-10-19 NOTE — Telephone Encounter (Signed)
See 5/25 email- this has been sent to Dr. Lamonte Sakai.

## 2016-10-19 NOTE — Telephone Encounter (Signed)
RB please advise on this message, patient is eagerly awaiting recs.  Thanks!

## 2016-10-20 ENCOUNTER — Encounter: Payer: Self-pay | Admitting: Emergency Medicine

## 2016-10-20 ENCOUNTER — Telehealth (HOSPITAL_COMMUNITY): Payer: Self-pay

## 2016-10-20 NOTE — Telephone Encounter (Signed)
Called to schedule f/u. Pt's husband stated that pt's back was better but she was now having pain from a new cracked rib. They did not want to come in for a f/u at this time because it would be too much for the pt to get in and out of the car. They will call if things change, but she is doing fine since the procedure. AW

## 2016-10-20 NOTE — Telephone Encounter (Signed)
Dr Lamonte Sakai, Please respond to our request regarding reducing Alexis Higgins's 15mg  dosage of Prednisone to 10mg  on a trial basis. This is our third time requesting this. Thank you, Alexis Higgins   Pt has been emailing in and has not been given a response by RB.   They are requesting to decrease the prednisone that the pt is on from 20 mg to 10 mg on a trial basis to see if this helps.  PM please advise. thanks

## 2016-10-21 ENCOUNTER — Telehealth: Payer: Self-pay | Admitting: Emergency Medicine

## 2016-10-21 ENCOUNTER — Other Ambulatory Visit: Payer: Self-pay

## 2016-10-21 ENCOUNTER — Telehealth: Payer: Self-pay | Admitting: Internal Medicine

## 2016-10-21 ENCOUNTER — Other Ambulatory Visit: Payer: Medicare Other

## 2016-10-21 DIAGNOSIS — I10 Essential (primary) hypertension: Secondary | ICD-10-CM | POA: Diagnosis not present

## 2016-10-21 DIAGNOSIS — J9611 Chronic respiratory failure with hypoxia: Secondary | ICD-10-CM | POA: Diagnosis not present

## 2016-10-21 DIAGNOSIS — Z955 Presence of coronary angioplasty implant and graft: Secondary | ICD-10-CM | POA: Diagnosis not present

## 2016-10-21 DIAGNOSIS — E785 Hyperlipidemia, unspecified: Secondary | ICD-10-CM | POA: Diagnosis not present

## 2016-10-21 DIAGNOSIS — M1991 Primary osteoarthritis, unspecified site: Secondary | ICD-10-CM | POA: Diagnosis not present

## 2016-10-21 DIAGNOSIS — I2510 Atherosclerotic heart disease of native coronary artery without angina pectoris: Secondary | ICD-10-CM | POA: Diagnosis not present

## 2016-10-21 DIAGNOSIS — I739 Peripheral vascular disease, unspecified: Secondary | ICD-10-CM | POA: Diagnosis not present

## 2016-10-21 DIAGNOSIS — R239 Unspecified skin changes: Secondary | ICD-10-CM | POA: Diagnosis not present

## 2016-10-21 DIAGNOSIS — J449 Chronic obstructive pulmonary disease, unspecified: Secondary | ICD-10-CM | POA: Diagnosis not present

## 2016-10-21 DIAGNOSIS — M8008XD Age-related osteoporosis with current pathological fracture, vertebra(e), subsequent encounter for fracture with routine healing: Secondary | ICD-10-CM | POA: Diagnosis not present

## 2016-10-21 DIAGNOSIS — I2721 Secondary pulmonary arterial hypertension: Secondary | ICD-10-CM | POA: Diagnosis not present

## 2016-10-21 NOTE — Telephone Encounter (Signed)
See 5/31 phone note- this has already been addressed.

## 2016-10-21 NOTE — Telephone Encounter (Signed)
Ok for verbal 

## 2016-10-21 NOTE — Telephone Encounter (Signed)
Spoke with pt, aware of recs. Will call back if pt does not tolerate decreased dosage of prednisone.  Nothing further needed.

## 2016-10-21 NOTE — Telephone Encounter (Signed)
Verbal orders given to [place].

## 2016-10-21 NOTE — Telephone Encounter (Signed)
Requesting verbal orders for PT for 1 week 1 and 2 week 3.

## 2016-10-21 NOTE — Telephone Encounter (Signed)
Spoke with pt's spouse, upset that his multiple mychart messages about pt's prednisone dosage have not yet been addressed.  Pt's spouse is requesting to decrease prednisone dosage to 10mg  daily.  Pt's spouse is requesting a response today.  RB please advise on prednisone dosage.  Thanks.

## 2016-10-21 NOTE — Patient Outreach (Signed)
[facility]) Care Management  10/21/16  Alexis Higgins 1940/08/21 [id number]  Successful outreach completed with patient and husband, Alexis Higgins. Patient identification verified. Patient gave verbal permission on the phone to complete the call with her husband due to being in pain.  Patient and husband were initially reluctant when answered phone. RNCM provided an explanation of [facility] Care Management services and RNCM role. Explained that [facility] services will not interfere or replace home health as patient is currently active with [facility] for physical therapy. Patient and husband were receptive to outreach and willing to have RNCM come out for a home visit next week to complete in-home evaluation.  Patient's husband shared brief history of her main problems that are going on. He reported that he and his wife are both former smokers with 50 years history of smoking. 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 She is currently on continuous oxygen at 2lpm. He also stated that her PCP had initially placed her on O2, but she nows follows up with a pulmonologist. He stated that her last lung capacity study done showed she was at only 30% lung capacity. He also reported that she was on 10 mg of prednisone at all times.  He also reported that patient has osteoarthritis and has a lot of trouble with pain.  In August to late September of 2017, patient's prednisone was increased from 10 mg to 20 mg daily. They were dealing with a lot of pain issues radiating down right side and pain management recommended that they have some x-rays to see what was going on. She was found to have a compression fracture at T5. She was then scheduled for a kyphoplasty to fill the space and immediately felt relief. He stated the she did well until February of 2018 and began to have pain again. She was found to have another compression fracture and had an additional  kyphoplasty and it worked fairly well from February to April. In April, she began experiencing serious pain on her right side of her chest and he called 911 to have her taken to [facility] emergency department for pain management. She had another compression fracture, but they were primarily concerned with her complaints of "chest pain" and worked her up for her heart. She did not have any problems with her heart and was sent home. Two hours later, he had to call an ambulance to come and get her again and take her to [facility] emergency department. He stated that they were also concerned about her heart, but did admit her and assist with pain management. She had a kyphoplasty done again but was discharged with the same amount of pain in her right chest as she did when she arrived.  He stated that finally her doctor looked at a CT that was done to look for blood clots at [facility] and "immediately saw what her problem was." She had experienced 4 broken ribs on the right side and there were signs of other broken ribs that had already healed. He tried to work with what they had to help manage her pain as there was no treatment since her rib fractures were not injuring her lungs, but she began displaying signs of another compression fracture in May. She had another MRI and had to have her admitted again for pain management 2 days prior to her kyphoplasty.  She had her kyphplasty and began having pain on the left side  of her chest, but they thought that this was radiating from pain in her back. He stated that today, she is in as much pain as before and they are struggling to manage her pain at home. She now has broken rib on her left side.  They are currently using tramadol, tylenol and voltaren gel for pain management and this is not working. He is giving her 2 tablets of her tramadol twice a day, but it is not keeping her comfortable. He is having "tremendous difficulty managing her pain."  He stated that they  finally realized that her prednisone was causing her bones to be brittle in addition to her osteoarthritis and finally decreased her prednisone to 15 mg per day. He stated that today they agreed to cut it back to 10 mg per day and will watch and see if she can manage at that dose without affecting her breathing. Patient's husband voices concerns because he is her primary caregiver and he cannot help her. He stated that all she can do is lay there in pain. He stated that her pain is never below a 5, but also reported that they have difficulty understanding the "numbers" to rate pain. He stated he has to assist her with bathing and dressing and has to feed her at times. He said that she is not ambulatory and uses a wheelchair at all times, but reported difficulty with transfers due to pain. RNCM encouraged him to work with PT to review safe ways to move her and methods for transferring that may be less painful.  He reported that they had home health ordered in April, but they declined nursing because they did not see what they could do for them and PT was put on hold because she could not participate.  He is interested in any help they can get. Even if it is someone to come every once in a while to assist with bathing and other things. He stated that he had to quit his part-time job to take care of her and they live from check to check. He stated that they do not have any money to pay out of pocket for any additional services. He said that they have been told that they do not qualify for Medicaid or food stamps, but that they make right at the cut off. He did state that placement to rehab is the last option on their list and they do not want to pursue this. Patient goes to [facility] for her pain, but he feels that they are having a lot of difficulty getting a regimen that will help her be more comfortable. He stated he wishes he could just give her 2 tablets of her tramadol 3 times a day instead of  2 and feels that would help more.  Patient does not have advance directives, but would like more information and assistance in filling out paperwork.  At end of call, patient's husband expressed appreciation for call and is agreeable to a home visit next week. Plan:  RNCM to complete home visit next week to evaluate for additional needs. Also to collaborate with PCP and pain management doctor regarding pain management regimen to discuss alternatives/other ways to help patient manage pain at home. RNCM to make referral to SW for community resources, assess for in-home aides if possible and assist with advance directives. RNCM to make referral to pharmacy for polypharmacy review. [place] R. Louella Medaglia, RN, BSN, [place] Management Coordinator [phone]

## 2016-10-21 NOTE — Telephone Encounter (Signed)
OK for her to decrease to 10mg  qd

## 2016-10-25 ENCOUNTER — Encounter: Payer: Self-pay | Admitting: *Deleted

## 2016-10-26 ENCOUNTER — Encounter: Payer: Self-pay | Admitting: Emergency Medicine

## 2016-10-26 ENCOUNTER — Telehealth: Payer: Self-pay | Admitting: Internal Medicine

## 2016-10-26 DIAGNOSIS — I10 Essential (primary) hypertension: Secondary | ICD-10-CM | POA: Diagnosis not present

## 2016-10-26 DIAGNOSIS — M8008XD Age-related osteoporosis with current pathological fracture, vertebra(e), subsequent encounter for fracture with routine healing: Secondary | ICD-10-CM | POA: Diagnosis not present

## 2016-10-26 DIAGNOSIS — M1991 Primary osteoarthritis, unspecified site: Secondary | ICD-10-CM | POA: Diagnosis not present

## 2016-10-26 DIAGNOSIS — J449 Chronic obstructive pulmonary disease, unspecified: Secondary | ICD-10-CM | POA: Diagnosis not present

## 2016-10-26 DIAGNOSIS — I2721 Secondary pulmonary arterial hypertension: Secondary | ICD-10-CM | POA: Diagnosis not present

## 2016-10-26 DIAGNOSIS — R239 Unspecified skin changes: Secondary | ICD-10-CM | POA: Diagnosis not present

## 2016-10-26 DIAGNOSIS — E785 Hyperlipidemia, unspecified: Secondary | ICD-10-CM | POA: Diagnosis not present

## 2016-10-26 DIAGNOSIS — J9611 Chronic respiratory failure with hypoxia: Secondary | ICD-10-CM | POA: Diagnosis not present

## 2016-10-26 DIAGNOSIS — I2510 Atherosclerotic heart disease of native coronary artery without angina pectoris: Secondary | ICD-10-CM | POA: Diagnosis not present

## 2016-10-26 DIAGNOSIS — I739 Peripheral vascular disease, unspecified: Secondary | ICD-10-CM | POA: Diagnosis not present

## 2016-10-26 DIAGNOSIS — Z955 Presence of coronary angioplasty implant and graft: Secondary | ICD-10-CM | POA: Diagnosis not present

## 2016-10-26 NOTE — Telephone Encounter (Signed)
Dr Lamonte Sakai, please advise. Pt unable to tolerate the Prednisone decrease and had to bump back up to 15mg . Please advise rec's. Thanks.   ----- Message -----    From: Rosey Bath. [place]    Sent: 10/26/2016  8:52 AM EDT      To: Collene Gobble., MD Subject: Non-Urgent Medical Question  Dr. Lamonte Sakai, This is to inform you that Alexis Higgins is unable to tolerate the change to 10 mg of Prednisone. We have returned her dosage to 15mg  as of yesterday. We hope to be able to reduce it at some other time. Thank you for your help & concern. Alexis Higgins

## 2016-10-26 NOTE — Telephone Encounter (Signed)
Left VM for Scar[place] with verbal orders.

## 2016-10-26 NOTE — Telephone Encounter (Signed)
Ok to stay on 15mg  prednisone. No other recommendations at this time.

## 2016-10-26 NOTE — Telephone Encounter (Signed)
Ok for verbal 

## 2016-10-26 NOTE — Telephone Encounter (Signed)
[facility] called and would like a verbal order for a nurse to go out to look at patients elbow.   [place]

## 2016-10-27 ENCOUNTER — Other Ambulatory Visit: Payer: Self-pay

## 2016-10-27 ENCOUNTER — Other Ambulatory Visit: Payer: Self-pay | Admitting: Internal Medicine

## 2016-10-27 DIAGNOSIS — J9611 Chronic respiratory failure with hypoxia: Secondary | ICD-10-CM | POA: Diagnosis not present

## 2016-10-27 DIAGNOSIS — J449 Chronic obstructive pulmonary disease, unspecified: Secondary | ICD-10-CM | POA: Diagnosis not present

## 2016-10-27 DIAGNOSIS — I2510 Atherosclerotic heart disease of native coronary artery without angina pectoris: Secondary | ICD-10-CM | POA: Diagnosis not present

## 2016-10-27 DIAGNOSIS — M8008XD Age-related osteoporosis with current pathological fracture, vertebra(e), subsequent encounter for fracture with routine healing: Secondary | ICD-10-CM | POA: Diagnosis not present

## 2016-10-27 DIAGNOSIS — I10 Essential (primary) hypertension: Secondary | ICD-10-CM | POA: Diagnosis not present

## 2016-10-27 DIAGNOSIS — I739 Peripheral vascular disease, unspecified: Secondary | ICD-10-CM | POA: Diagnosis not present

## 2016-10-27 DIAGNOSIS — R239 Unspecified skin changes: Secondary | ICD-10-CM | POA: Diagnosis not present

## 2016-10-27 DIAGNOSIS — M1991 Primary osteoarthritis, unspecified site: Secondary | ICD-10-CM | POA: Diagnosis not present

## 2016-10-27 DIAGNOSIS — E785 Hyperlipidemia, unspecified: Secondary | ICD-10-CM | POA: Diagnosis not present

## 2016-10-27 DIAGNOSIS — Z955 Presence of coronary angioplasty implant and graft: Secondary | ICD-10-CM | POA: Diagnosis not present

## 2016-10-27 DIAGNOSIS — I2721 Secondary pulmonary arterial hypertension: Secondary | ICD-10-CM | POA: Diagnosis not present

## 2016-10-27 NOTE — Patient Outreach (Signed)
[facility]) Care Management   10/27/2016  Alexis Higgins May 28, 1940 [id number]   Home visit completed with patient and her husband to complete initial assessment.  Alexis Higgins is an 76 y.o. female  Subjective:   Objective:   Review of Systems  Respiratory: Negative.   Cardiovascular: Positive for leg swelling.       Edema noted in bilateral lower extremities; also cold to touch  Musculoskeletal: Positive for back pain.       Right and left rib pain    Physical Exam  Constitutional: She is oriented to person, place, and time.  Cardiovascular: Normal rate, regular rhythm, normal heart sounds and intact distal pulses.   Respiratory: Effort normal and breath sounds normal.  Neurological: She is alert and oriented to person, place, and time.  Skin: Skin is warm and dry.     Psychiatric: She has a normal mood and affect. Her behavior is normal. Judgment and thought content normal. Cognition and memory are normal.    Encounter Medications:   Outpatient Encounter Prescriptions as of 10/27/2016  Medication Sig Note  . ascorbic acid (VITAMIN C) 1000 MG tablet Take 1,000 mg by mouth daily.   . Cholecalciferol (VITAMIN D) 1000 UNITS capsule Take 1,000 Units by mouth every evening.    . clopidogrel (PLAVIX) 75 MG tablet TAKE 1 TABLET (75 MG TOTAL) BY MOUTH DAILY. 10/06/2016: On hold for procedure   . diclofenac sodium (VOLTAREN) 1 % GEL APPLY 4 GRAMS TOPICALLY 4 TIMES DAILY   . docusate sodium (COLACE) 100 MG capsule Take 100 mg by mouth daily as needed. For constipation   . DULoxetine (CYMBALTA) 30 MG capsule Take 1 capsule (30 mg total) by mouth daily.   . furosemide (LASIX) 20 MG tablet Take 1 tablet (20 mg total) by mouth daily.   . isosorbide mononitrate (IMDUR) 30 MG 24 hr tablet TAKE 0.5 TABLETS (15 MG TOTAL) BY MOUTH DAILY.   Marland Kitchen lovastatin (MEVACOR) 20 MG tablet TAKE 1 TABLET BY MOUTH AT BEDTIME   . metoprolol tartrate (LOPRESSOR) 25 MG tablet TAKE 1/2  TABLET BY MOUTH 2 TIMES A DAY   . nitroGLYCERIN (NITROSTAT) 0.4 MG SL tablet Place 1 tablet (0.4 mg total) under the tongue every 5 (five) minutes as needed for chest pain (up to 3 doses).   . ondansetron (ZOFRAN) 4 MG tablet Take 1 tablet (4 mg total) by mouth every 8 (eight) hours as needed for nausea or vomiting. (Patient taking differently: Take 4 mg by mouth 2 (two) times daily. )   . polyethylene glycol (MIRALAX / GLYCOLAX) packet Take 17 g by mouth daily as needed for mild constipation.   . polyvinyl alcohol (LIQUIFILM TEARS) 1.4 % ophthalmic solution Place 1 drop into both eyes daily as needed for dry eyes.    . predniSONE (DELTASONE) 10 MG tablet Take 15 mg by mouth daily with breakfast.   . PROAIR HFA 108 (90 Base) MCG/ACT inhaler INHALE 2 PUFFS INTO THE LUNGS EVERY 6 (SIX) HOURS AS NEEDED FOR WHEEZING OR SHORTNESS OF BREATH.   . tiotropium (SPIRIVA HANDIHALER) 18 MCG inhalation capsule Place 1 capsule (18 mcg total) into inhaler and inhale daily at 6 (six) AM.   . traMADol (ULTRAM) 50 MG tablet Take 1 tablet (50 mg total) by mouth every 8 (eight) hours as needed.   . vitamin B-12 (CYANOCOBALAMIN) 1000 MCG tablet Take 1,000 mcg by mouth daily.   . [DISCONTINUED] diclofenac sodium (VOLTAREN) 1 % GEL Apply 4 g  topically 4 (four) times daily.    No facility-administered encounter medications on file as of 10/27/2016.     Functional Status:   In your present state of health, do you have any difficulty performing the following activities: 10/27/2016 10/21/2016  Hearing? - N  Vision? - N  Difficulty concentrating or making decisions? - N  Walking or climbing stairs? - Y  Dressing or bathing? - Y  Doing errands, shopping? - Y  Preparing Food and eating ? - Y  Using the Toilet? - Y  In the past six months, have you accidently leaked urine? N -  Do you have problems with loss of bowel control? N -  Managing your Medications? - Y  Managing your Finances? - N  Housekeeping or managing your  Housekeeping? - Y  Some recent data might be hidden    Fall/Depression Screening:    Fall Risk  10/27/2016 08/10/2016 08/08/2015  Falls in the past year? No No No  Number falls in past yr: - - -  Injury with Fall? - - -  Risk Factor Category  - - -  Risk for fall due to : Impaired balance/gait;Impaired mobility - -   PHQ 2/9 Scores 10/27/2016 08/10/2016 08/08/2015 02/18/2015 02/01/2014 10/24/2013 01/31/2013  PHQ - 2 Score 1 0 0 0 0 1 0    Assessment:   Patient is alert and oriented laying back in her recliner with her feet elevated. She has a bedside commode in her living room and has difficulty with mobility due to increased pain. She and her husband stated that they used to go out and go for a drive and now they are unable to do this due to patient's current problems with fractures and pain.   Patient has a history of COPD, dependent on oxygen and steroids. This is currently stable. She wears continuous O2 at 2lpm. She attempted to lower her dose of prednisone to 10 mg daily (to help prevent further bone fractures)  But she became symptomatic (short of breath)  and had to increase her dose back to 15 mg daily. She started taking prednisose daily several years ago when her rescue inhalers no longer helped her when she was short of breath.  Patient's primary concern is her pain. Patient's husband reported that she has a prescription for tramadol. Her order reads to take 1 tablet by mouth every 8 hours as needed. However, her husband reported that 1 tablet does not help her with her pain and he has been giving patient 2 tablets twice a day. He stated that her pain management doctor, Dr. Greta Doom has temporarily allowed her to take 2 extra doses per day x 2 weeks. So he is now giving her 2 tablets 3 times a day and can space them out in a way that better keeps her pain managed.   Today, patient does complain of pain, but she stated that it is not as bad as it has been. Her husband stated that he is supposed to  give it to her again around 5 pm and also apply the voltaren gel. He said if they wait longer than his routine, then she experiences increased pain and has difficulty getting it back under control.   RNCM offered to work with patient and her pain management doctor to discuss how to best help her with her pain management plan.   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 Greta Doom to discuss how current pain management regimen is working at home and try to find ways to assist with improving patient's quality of life,  RNCM will assess patient's expected goals/outcomes of pain management regimen   [facility] CM Short Term Goal #1   Patient will verbalize a realistic pain management goal/outcome within the next 30 days  [facility] CM Short Term Goal #1 Start Date  10/27/16  Interventions for Short Term Goal #1  RNCM will work with patient and pain management provider to determine a realistic goal of treatment,  RNCM will provide education about chronic pain and possible outcomes of treatment, including possibility of not reaching 100% pain free  [facility] CM Short Term Goal #2   Patient will verbalize 2-3 alternative, noninvasive pain relief measures to help with pain within the next 30 days  [facility] CM Short Term Goal #2 Start Date  10/27/16  Interventions for Short Term Goal #2  RNCM will educate patient on alternative, noninvasive pain relief measures and  will work with patient and/or providers to determine which ones may benefit patient the most     RNCM to follow up with patient and her husband next week via phone call with plans for another home visit in a few weeks.  [place] R. Geno Sydnor, RN, BSN, [place] Management Coordinator [phone]

## 2016-11-01 ENCOUNTER — Encounter: Payer: Self-pay | Admitting: Internal Medicine

## 2016-11-01 ENCOUNTER — Other Ambulatory Visit: Payer: Self-pay

## 2016-11-01 NOTE — Patient Outreach (Signed)
[facility]) Care Management  11/01/16   Alexis Higgins 1941-05-02 [id number]  Received voicemail from patient's husband voicing concerns that his wife will not be able to take additional tramadol after the end of this week. He reported that patient is still experiencing pain, but that the additional tramadol that Dr. Greta Doom has allowed has been helpful in managing her pain. He stated that they are better able to control her pain and she is feeling a little better on this regimen. However, they are very concerned because they fear that she will return to unmanaged pain once the extra doses run out.   RNCM placed a call to Dr. Margaretha Sheffield with [facility] and left a voicemail on nurse line requesting callback. Advised of patient concerns and how this has helped her to better manage her pain. Requested callback to discuss options to help.  RNCM contact information provided. Awaiting callback.  [name], RN, BSN, [place] Management Coordinator [phone]

## 2016-11-02 ENCOUNTER — Other Ambulatory Visit: Payer: Self-pay | Admitting: *Deleted

## 2016-11-02 ENCOUNTER — Encounter: Payer: Self-pay | Admitting: *Deleted

## 2016-11-02 ENCOUNTER — Other Ambulatory Visit: Payer: Self-pay | Admitting: Internal Medicine

## 2016-11-02 NOTE — Patient Outreach (Signed)
[facility]) Care Management  11/02/2016  Alexis Higgins 1941-01-12 [id number]   CSW was able to make initial contact with patient and patient's husband, Alexis Higgins today to perform phone assessment, as well as assess and assist with social work needs and services.  CSW introduced self, explained role and types of services provided through [place] [facility] ([place] [facility]).  CSW further explained to patient that CSW works with patient's RNCM, also with [place] [facility], [name]. CSW then explained the reason for the call, indicating that [name] thought that patient would benefit from social work services and resources to assist with arranging in-home care services, as well as assistance with completion of Advanced Directives.  CSW obtained two HIPAA compliant identifiers from patient, which included patient's name and date of birth. After performing a Financial Assessment Form with patient and Mr. Tanney, CSW learned that neither will qualify for Adult Medicaid through the [place], as they're combined income far exceeds the poverty guidelines for the state of [place] for fiscal year 2018. Mr. Kirkeby was extremely angry, voicing his frustrations about "putting into the system all of these years but unable to get anything in return". CSW offered counseling and supportive services, where appropriate, allowing Mr. Demeritt an opportunity to vent. CSW offered to mail patient and Mr. Torosyan a Veterinary surgeon, but neither were interested, indicating that they are unable to afford any additional out-of-pocket expenses each month. CSW felt the need to further explain to patient and Mr. Raether reasons why they are ineligible to receive [employer]) through the Department of [place], CAPS (Community Alternative Program Services) through the  [place], and [employer] (Publishing rights manager) through [employer]. Patient must be an Adult Medicaid recipient to qualify for CAPS and [facility] and [facility] is phasing out their program, no longer accepting new referrals. Patient and Mr. Rager did express an interest in completing their Advanced Directives ([place] documents). CSW agreed to place two packets in the mail to them today. CSW then agreed to follow-up with patient and Mr. Stave in one week, no only to ensure that they received the packet of information, but also to schedule a home visit to assist with completion. Patient and Mr. Creed voiced understanding and were both agreeable to this plan. [name], BSW, MSW, LCSW  Licensed Education officer, [facility]  Mailing [place] [address] [place], [place] [address] Physical Address-[address], [place], [place] [address] Toll Free Main # [phone] Fax # [phone] Cell # [phone]  Office # [phone] [email or web address]

## 2016-11-03 NOTE — Patient Outreach (Signed)
Request received from [name], LCSW to mail personal care resources.  Information mailed today 11/03/16.

## 2016-11-04 ENCOUNTER — Telehealth: Payer: Self-pay | Admitting: Internal Medicine

## 2016-11-04 NOTE — Telephone Encounter (Signed)
Husband called in and said that he thinks that she has another stress fracture in her back.  He is request for dr Jenny Reichmann to pt in a referral for a MRI asap   Best number [phone]

## 2016-11-04 NOTE — Telephone Encounter (Signed)
Spoke with Percell Miller, pt's husband, and he stated that this has been done 4 times since August without an appt. Stated Dr. Marti Sleigh was suppose to send some type of paperwork explaining everything going on with the patient. He stated its no point in an OV because its nothing that Dr. Jenny Reichmann can do but look at her back and say ok I'll order a MRI when he can just order one now and it be done in a matter of hours. He also went on to say that insurance has not had an issue covering an MRI because she does get frequent stress fractures. Please advise.

## 2016-11-04 NOTE — Telephone Encounter (Signed)
Unfortunately, this is not normally done without an exam due to the high cost and need for docunentation to support this; please consider OV, or ED if unable

## 2016-11-05 ENCOUNTER — Other Ambulatory Visit (HOSPITAL_COMMUNITY): Payer: Self-pay | Admitting: Interventional Radiology

## 2016-11-05 ENCOUNTER — Other Ambulatory Visit: Payer: Self-pay

## 2016-11-05 ENCOUNTER — Encounter (HOSPITAL_COMMUNITY): Payer: Self-pay

## 2016-11-05 ENCOUNTER — Ambulatory Visit (INDEPENDENT_AMBULATORY_CARE_PROVIDER_SITE_OTHER): Payer: Medicare Other | Admitting: Internal Medicine

## 2016-11-05 ENCOUNTER — Ambulatory Visit (HOSPITAL_COMMUNITY)
Admission: RE | Admit: 2016-11-05 | Discharge: 2016-11-05 | Disposition: A | Discharge status: Home / Self Care | Payer: Medicare Other | Source: Ambulatory Visit | Attending: Interventional Radiology | Admitting: Interventional Radiology

## 2016-11-05 ENCOUNTER — Encounter: Payer: Self-pay | Admitting: Diagnostic Radiology

## 2016-11-05 VITALS — BP 162/88 | HR 97 | Ht 66.0 in | Wt 137.0 lb

## 2016-11-05 DIAGNOSIS — M549 Dorsalgia, unspecified: Secondary | ICD-10-CM | POA: Diagnosis not present

## 2016-11-05 DIAGNOSIS — R7302 Impaired glucose tolerance (oral): Secondary | ICD-10-CM | POA: Diagnosis not present

## 2016-11-05 DIAGNOSIS — IMO0001 Reserved for inherently not codable concepts without codable children: Secondary | ICD-10-CM

## 2016-11-05 DIAGNOSIS — I10 Essential (primary) hypertension: Secondary | ICD-10-CM | POA: Diagnosis not present

## 2016-11-05 DIAGNOSIS — M546 Pain in thoracic spine: Secondary | ICD-10-CM

## 2016-11-05 DIAGNOSIS — M4850XA Collapsed vertebra, not elsewhere classified, site unspecified, initial encounter for fracture: Secondary | ICD-10-CM

## 2016-11-05 NOTE — Patient Instructions (Signed)
Please continue all other medications as before, including the higher dose tramadol if needed  Please have the pharmacy call with any other refills you may need.  Please keep your appointments with your specialists as you may have planned - the MRI later today  We will ask Jonelle Sidle in this office to help with starting the Prolia shots

## 2016-11-05 NOTE — Patient Outreach (Signed)
[facility]) Care Management  11/05/16  Alexis Higgins [birth year] [id number]  Attempted to reach patient without success to follow up on pain medications. Left HIPAA compliant voicemail with RNCM contact information and invited callback. Will attempt to outreach patient again next week.  [name], RN, BSN, [place] Management Coordinator [phone]

## 2016-11-05 NOTE — Telephone Encounter (Signed)
-----   Message from Biagio Borg, MD sent at 11/05/2016 12:32 PM EDT ----- Regarding: prolia Please to look in to new prolia start for this patient , thanks

## 2016-11-05 NOTE — Telephone Encounter (Signed)
Jonelle Sidle will be submitting information into prolia portal to verify insurance and will call patient with summary of benefits and to discuss starting prolia

## 2016-11-05 NOTE — Progress Notes (Signed)
Subjective:    Patient ID: Alexis Higgins, female    DOB: 1941-04-26, 76 y.o.   MRN: [id number]  HPI   Here with husband with c/o sudden onset mid thoracic spine area yesterday similar to previous episodes; husband has also been in contact with IR who has ordered T-spine MRI for later today.  Pt is S/p kyphoplasty x 4 per husband, would need to be off plavix for 5 days prior to any further, so pt stopped today, hoping for Kyphoplasty next Tuesday as this would be 5 days and the day for IR procedures.  Pt denies chest pain, increased sob or doe, wheezing, orthopnea, PND, increased LE swelling, palpitations, dizziness or syncope.  Pt denies new neurological symptoms such as new headache, or facial or extremity weakness or numbness   Pt denies polydipsia, polyuria. No fever.  Pain controlled with increased tramadol to 100 q6 prn  Has not yet been contacted regarding prolia start Past Medical History:  Diagnosis Date  . Abnormal chest x-ray    03/2012: will need OP f/u.  Marland Kitchen Anginal pain ([place])   . ANXIETY 01/01/2007  . BURSITIS, RIGHT HIP 06/04/2009  . CAD (coronary artery disease)    a. BMS to LAD 2010. b. NSTEMI with DES to LAD for ISR 2011. c. Patent stent 03/2012/Imdur added.  . Cataract   . CHEST PAIN-PRECORDIAL 01/15/2009  . Colon polyps    H/o tubular adenoma of colon  . COPD 01/01/2007   a. Chronic resp failure on home O2.  Marland Kitchen DEPRESSION 01/01/2007  . Eczema 01/08/2011  . GROIN PAIN 06/20/2008  . Headache(784.0) 01/01/2007  . Hemorrhoid   . HYPERTENSION 01/01/2007  . Impaired glucose tolerance 01/07/2011  . LOW BACK PAIN 01/01/2007  . Muscle weakness (generalized) 06/04/2009  . On home oxygen therapy    "2L; 24/7" (09/10/2016)  . OSTEOARTHRITIS, HIP 07/01/2008  . OSTEOPOROSIS 01/01/2007  . Pneumonia 1998  . Rosacea 01/08/2011  . Shortness of breath   . SYNCOPE 01/01/2007  . Thoracic compression fracture ([place])   . TIA (transient ischemic attack)   . TRANSIENT ISCHEMIC ATTACK, HX OF 01/01/2007    Past Surgical History:  Procedure Laterality Date  . ABDOMINAL HYSTERECTOMY    . APPENDECTOMY    . BACK SURGERY    . BREAST ENHANCEMENT SURGERY    . COLONOSCOPY  01/25/2002   tubular adenoma,hemorrhoids, hyperplastic  colon polyps  . COLONOSCOPY  02/17/2005   hemorrhoids  . CORONARY ANGIOPLASTY    . CORONARY STENT PLACEMENT    . HIP ARTHROPLASTY Left 10/01/2013   Procedure: ARTHROPLASTY UNIPOLAR   HIP;  Surgeon: Marianna Payment, MD;  Location: [facility];  Service: Orthopedics;  Laterality: Left;  . HIP SURGERY Left    DR XU     PROXIMAL NECK   . IR GENERIC HISTORICAL  02/19/2016   IR VERTEBROPLASTY CERV/THOR BX INC UNI/BIL INC/INJECT/IMAGING 02/19/2016 Luanne Bras, MD [facility]-INTERV RAD  . IR GENERIC HISTORICAL  07/15/2016   IR KYPHO THORACIC WITH BONE BIOPSY 07/15/2016 Luanne Bras, MD [facility]INTER[facility]  . IR KYPHO EA ADDL LEVEL THORACIC OR LUMBAR  09/14/2016  . IR RADIOLOGIST EVAL & MGMT  09/22/2016  . IR VERTEBROPLASTY CERV/THOR BX INC UNI/BIL INC/INJECT/IMAGING  10/08/2016  . LEFT HEART CATHETERIZATION WITH CORONARY ANGIOGRAM N/A 04/13/2012   Procedure: LEFT HEART CATHETERIZATION WITH CORONARY ANGIOGRAM;  Surgeon: Burnell Blanks, MD;  Location[facility] C[facility] LAB;  Service: Cardiovascular;  Laterality: N/A;  . OOPHORECTOMY     one ovary  .  s/p bilat cataract  2010  . sp lumbar disc surgury     [name]    reports that she quit smoking about 11 years ago. Her smoking use included Cigarettes. She has a 76.50 pack-year smoking history. She has never used smokeless tobacco. She reports that she does not drink alcohol or use drugs. family history includes Cardiomyopathy in her sister; Emphysema in her sister; Heart attack in her sister; Heart disease in her sister; Osteoporosis in her mother; Seizures in her sister. Allergies  Allergen Reactions  . Aspirin Other (See Comments)     cns bleed risk  . Codeine Anaphylaxis and Rash  . Other Other (See Comments)    Stiolto - severe  reaction - caused inability to breath   Current Outpatient Prescriptions on File Prior to Visit  Medication Sig Dispense Refill  . ascorbic acid (VITAMIN C) 1000 MG tablet Take 1,000 mg by mouth daily.    . Cholecalciferol (VITAMIN D) 1000 UNITS capsule Take 1,000 Units by mouth every evening.     . clopidogrel (PLAVIX) 75 MG tablet TAKE 1 TABLET (75 MG TOTAL) BY MOUTH DAILY. 30 tablet 11  . diclofenac sodium (VOLTAREN) 1 % GEL APPLY 4 GRAMS TOPICALLY 4 TIMES DAILY 100 g 0  . docusate sodium (COLACE) 100 MG capsule Take 100 mg by mouth daily as needed. For constipation    . furosemide (LASIX) 20 MG tablet Take 1 tablet (20 mg total) by mouth daily. 90 tablet 1  . isosorbide mononitrate (IMDUR) 30 MG 24 hr tablet TAKE 0.5 TABLETS (15 MG TOTAL) BY MOUTH DAILY. 45 tablet 3  . lovastatin (MEVACOR) 20 MG tablet TAKE 1 TABLET BY MOUTH AT BEDTIME 90 tablet 3  . metoprolol tartrate (LOPRESSOR) 25 MG tablet TAKE 1/2 TABLET BY MOUTH 2 TIMES A DAY  11  . nitroGLYCERIN (NITROSTAT) 0.4 MG SL tablet Place 1 tablet (0.4 mg total) under the tongue every 5 (five) minutes as needed for chest pain (up to 3 doses). 25 tablet 4  . ondansetron (ZOFRAN) 4 MG tablet Take 1 tablet (4 mg total) by mouth every 8 (eight) hours as needed for nausea or vomiting. (Patient taking differently: Take 4 mg by mouth 2 (two) times daily. ) 11 tablet 0  . polyethylene glycol (MIRALAX / GLYCOLAX) packet Take 17 g by mouth daily as needed for mild constipation.    . polyvinyl alcohol (LIQUIFILM TEARS) 1.4 % ophthalmic solution Place 1 drop into both eyes daily as needed for dry eyes.     . predniSONE (DELTASONE) 10 MG tablet Take 15 mg by mouth daily with breakfast.    . PROAIR HFA 108 (90 Base) MCG/ACT inhaler INHALE 2 PUFFS INTO THE LUNGS EVERY 6 (SIX) HOURS AS NEEDED FOR WHEEZING OR SHORTNESS OF BREATH. 25.5 Inhaler 2  . tiotropium (SPIRIVA HANDIHALER) 18 MCG inhalation capsule Place 1 capsule (18 mcg total) into inhaler and inhale  daily at 6 (six) AM. 90 capsule 3  . traMADol (ULTRAM) 50 MG tablet Take 1 tablet (50 mg total) by mouth every 8 (eight) hours as needed. 60 tablet 0  . vitamin B-12 (CYANOCOBALAMIN) 1000 MCG tablet Take 1,000 mcg by mouth daily.     No current facility-administered medications on file prior to visit.    Review of Systems  Constitutional: Negative for other unusual diaphoresis or sweats HENT: Negative for ear discharge or swelling Eyes: Negative for other worsening visual disturbances Respiratory: Negative for stridor or other swelling  Gastrointestinal: Negative for worsening  distension or other blood Genitourinary: Negative for retention or other urinary change Musculoskeletal: Negative for other MSK pain or swelling Skin: Negative for color change or other new lesions Neurological: Negative for worsening tremors and other numbness  Psychiatric/Behavioral: Negative for worsening agitation or other fatigue All other system neg per pt    Objective:   Physical Exam BP (!) 162/88   Pulse 97   Ht 5\' 6"  (1.676 m)   Wt 137 lb (62.1 kg)   SpO2 96%   BMI 22.11 kg/m  VS noted,  Constitutional: Pt appears in NAD HENT: Head: NCAT.  Right Ear: External ear normal.  Left Ear: External ear normal.  Eyes: . Pupils are equal, round, and reactive to light. Conjunctivae and EOM are normal Nose: without d/c or deformity Neck: Neck supple. Gross normal ROM Cardiovascular: Normal rate and regular rhythm.   Pulmonary/Chest: Effort normal and breath sounds without rales or wheezing.  Abd:  Soft, NT, ND, + BS, no organomegaly Neurological: Pt is alert. At baseline orientation, motor grossly intact Skin: Skin is warm. No rashes, other new lesions, no LE edema Psychiatric: Pt behavior is normal without agitation  No other exam findings  MRI THORACIC AND LUMBAR SPINE WITHOUT CONTRAST - summary findings 10/05/16  Limited sagittal imaging of the cervical spine: Stable compared  to 09/07/2016.  Thoracic segmentation:  Normal.  Alignment:  Stable.  Vertebrae: Interval augmentation of the T11 mild compression fracture with regressed marrow edema at that level. Chronic previously augmented T5 and T10 compression fractures. Chronic T6 compression fracture.  New mild T7 superior endplate compression fracture with mild to moderate vertebral body marrow edema. Slight retropulsion of the posterosuperior endplate. 35% loss of T7 vertebral body height. No associated spinal or foraminal stenosis. T7 posterior elements appear intact.  Chronic right T9 pedicle STIR hyperintensity is unchanged and felt related to benign hemangioma. No other acute osseous abnormality identified.  Cord: Remains normal. Capacious thoracic spinal canal. Normal conus medullaris at T12-L1.  Paraspinal and other soft tissues: Negative visualized thoracic and upper abdominal viscera. Mild posterior thoracic subcutaneous edema.  Disc levels:  Fairly capacious thoracic spinal canal. Borderline to mild multifactorial spinal stenosis at T10-T11 is stable.  MRI LUMBAR SPINE FINDINGS  Segmentation:  Normal.  Alignment:  Stable since 2010.  Vertebrae: No marrow edema or evidence of acute osseous abnormality. Visualized bone marrow signal is within normal limits. Visible sacrum and SI joints are intact.  Conus medullaris: Extends to the T12-L1 level and appears normal.  Paraspinal and other soft tissues: Stable and negative.  Disc levels:  Stable since 2010. Capacious lumbar spinal canal. Chronic postoperative changes to the right lamina at L4-L5. No lumbar spinal stenosis or significant foraminal stenosis.  IMPRESSION: MR THORACIC SPINE IMPRESSION  1. Acute to subacute T7 compression fracture with marrow edema and 35% loss of vertebral body height. Minimal retropulsion of the posterosuperior endplate. No associated spinal stenosis or other complicating  features. 2. Interval T11 vertebral augmentation. Chronic T5, T6, and T10 compression fractures.  MR LUMBAR SPINE IMPRESSION  No acute findings. Stable MRI appearance of the lumbar spine since 2010.    Assessment & Plan:

## 2016-11-06 NOTE — Assessment & Plan Note (Signed)
Acute onset mid thoracic yesterday, pain controlled with increased tramadol, no new neuro changes, has Thoracic spine MRI repeat scheduled for later today, f/u IR and hold plavix as she is doing

## 2016-11-06 NOTE — Assessment & Plan Note (Signed)
Mod elevated uncontrolled likely situational, o/w stable overall by history and exam, recent data reviewed with pt, and pt to continue medical treatment as before,  to f/u any worsening symptoms or concerns BP Readings from Last 3 Encounters:  11/05/16 (!) 162/88  10/27/16 (!) 150/72  10/09/16 135/73

## 2016-11-06 NOTE — Assessment & Plan Note (Signed)
Lab Results  Component Value Date   HGBA1C 5.7 02/05/2016  stable overall by history and exam, recent data reviewed with pt, and pt to continue medical treatment as before,  to f/u any worsening symptoms or concerns

## 2016-11-08 ENCOUNTER — Other Ambulatory Visit: Payer: Self-pay

## 2016-11-08 ENCOUNTER — Telehealth (HOSPITAL_COMMUNITY): Payer: Self-pay | Admitting: Radiology

## 2016-11-08 ENCOUNTER — Other Ambulatory Visit: Payer: Self-pay | Admitting: Pharmacist

## 2016-11-08 NOTE — Patient Outreach (Signed)
Alexis Higgins) Care Management  [place]   11/08/2016  LAWANA HARTZELL [birth year] [id number]  Subjective:  Patient was referred to [facility] [place] for polypharmacy medication review.    Successful phone outreach to patient, her spouse answered phone, on [facility] Consent form, and placed call on speaker phone with patient.  HIPAA details verified by patient.     Patient and spouse were able to review medication list over the phone.  Spouse states one concern is medication cost and also medication options to treat osteoporosis.    Patient/spouse report patient is already receiving manufacturer assistance through [employer] for Spiriva (tiotropium).  Spouse reports main concern for patient is pain.   Spouse reports patient stopped duloxetine and is taking nortriptyline again.    Spouse reports he manages patient's pill box and fills it 7 days at a time.  He denies concerns with filling patient's pill box.    Objective:   Current Medications: Current Outpatient Prescriptions  Medication Sig Dispense Refill  . ascorbic acid (VITAMIN C) 1000 MG tablet Take 1,000 mg by mouth daily.    . Cholecalciferol (VITAMIN D) 1000 UNITS capsule Take 1,000 Units by mouth every evening.     . clopidogrel (PLAVIX) 75 MG tablet TAKE 1 TABLET (75 MG TOTAL) BY MOUTH DAILY. 30 tablet 11  . diclofenac sodium (VOLTAREN) 1 % GEL APPLY 4 GRAMS TOPICALLY 4 TIMES DAILY 100 g 0  . docusate sodium (COLACE) 100 MG capsule Take 100 mg by mouth daily as needed. For constipation    . furosemide (LASIX) 20 MG tablet Take 1 tablet (20 mg total) by mouth daily. 90 tablet 1  . isosorbide mononitrate (IMDUR) 30 MG 24 hr tablet TAKE 0.5 TABLETS (15 MG TOTAL) BY MOUTH DAILY. 45 tablet 3  . lovastatin (MEVACOR) 20 MG tablet TAKE 1 TABLET BY MOUTH AT BEDTIME 90 tablet 3  . metoprolol tartrate (LOPRESSOR) 25 MG tablet TAKE 1/2 TABLET BY MOUTH 2 TIMES A DAY  11  . nitroGLYCERIN  (NITROSTAT) 0.4 MG SL tablet Place 1 tablet (0.4 mg total) under the tongue every 5 (five) minutes as needed for chest pain (up to 3 doses). 25 tablet 4  . ondansetron (ZOFRAN) 4 MG tablet Take 1 tablet (4 mg total) by mouth every 8 (eight) hours as needed for nausea or vomiting. (Patient taking differently: Take 4 mg by mouth 2 (two) times daily. ) 11 tablet 0  . polyethylene glycol (MIRALAX / GLYCOLAX) packet Take 17 g by mouth daily as needed for mild constipation.    . polyvinyl alcohol (LIQUIFILM TEARS) 1.4 % ophthalmic solution Place 1 drop into both eyes daily as needed for dry eyes.     . predniSONE (DELTASONE) 10 MG tablet Take 15 mg by mouth daily with breakfast.    . PROAIR HFA 108 (90 Base) MCG/ACT inhaler INHALE 2 PUFFS INTO THE LUNGS EVERY 6 (SIX) HOURS AS NEEDED FOR WHEEZING OR SHORTNESS OF BREATH. 25.5 Inhaler 2  . tiotropium (SPIRIVA HANDIHALER) 18 MCG inhalation capsule Place 1 capsule (18 mcg total) into inhaler and inhale daily at 6 (six) AM. 90 capsule 3  . traMADol (ULTRAM) 50 MG tablet Take 1 tablet (50 mg total) by mouth every 8 (eight) hours as needed. 60 tablet 0  . vitamin B-12 (CYANOCOBALAMIN) 1000 MCG tablet Take 1,000 mcg by mouth daily.     No current facility-administered medications for this visit.     Functional Status: In your  present state of health, do you have any difficulty performing the following activities: 11/02/2016 10/27/2016  Hearing? N -  Vision? N -  Difficulty concentrating or making decisions? N -  Walking or climbing stairs? Y -  Dressing or bathing? Y -  Doing errands, shopping? Y -  Conservation officer, nature and eating ? Y -  Using the Toilet? Y -  In the past six months, have you accidently leaked urine? N N  Do you have problems with loss of bowel control? N N  Managing your Medications? Y -  Managing your Finances? N -  Housekeeping or managing your Housekeeping? Y -  Some recent data might be hidden    Fall/Depression Screening: Fall Risk   11/02/2016 10/27/2016 08/10/2016  Falls in the past year? No No No  Number falls in past yr: - - -  Injury with Fall? - - -  Risk Factor Category  - - -  Risk for fall due to : Impaired balance/gait;Impaired mobility;History of fall(s) Impaired balance/gait;Impaired mobility -   PHQ 2/9 Scores 11/02/2016 10/27/2016 08/10/2016 08/08/2015 02/18/2015 02/01/2014 10/24/2013  PHQ - 2 Score 1 1 0 0 0 0 1    Assessment:  Medication review per spouse/patient report and review of medication list in chart.   Drugs sorted by system:  Neurologic/Psychologic: -nortriptyline---spouse reports 10 mg---2 caps at bedtime per Dr Greta Doom   Cardiovascular: -clopidogrel---spouse reports patient holding at this time, confirmed per 11/05/16 PCP office note -furosemide ---spouse reports patient taking as needed -isosorbide mononitrate  -lovastatin  -metoprolol tartrate  -sublingual nitroglycerin as needed  Pulmonary/Allergy: -albuterol inhaler---ProAir -Spiriva   Gastrointestinal: -docusate as needed -ondansetron as needed  -polyethylene glycol (Miralax)   Topical: -diclofenac gel   Pain: -tramadol---spouse reports 50 mg tabs---2 tabs 3x daily as needed---reports she sees Dr Greta Doom  Vitamins/Minerals: -vitamin C  -vitamin D---spouse reports patient not using routinely at this time -cyanocobalamin---spouse reports patient not using routinely at this time   Miscellaneous: -prednisone   Medications to avoid in the elderly:  -nortriptyline---increased risk of anti-cholinergic effects/falls   Drug interactions:  -ondansetron and nortriptyline---increased risk of QT prolongation -nortriptyline and tramadol---increased risk of CNS depression  Medication assistance: -Spiriva---reminded patient/spouse they will need to re-apply each year to have eligibility determined for [facility]  -albuterol---ProAir---[employer] appears to require patient's have no insurance.  Patient/spouse do not  want to try to apply for [employer] (Ventolin) or [employer] (Proventil) patient assistance  -diclofenac gel---appears to be Tier 3 with a prior authorization on patient's preferred drug list for her MA-PDP.  Spouse reports $45/30 day supply co-pay doesn't save them money from what they are presently paying.    Mail order:   Discussed Tier 1 and Tier 2 medications may have $0 co-pay for 90 day supply from mail order pharmacy---they don't wish to use mail order at this time.    Patient counseling:  Osteoporosis---per review of 11/05/16 office note, appears PCP is looking into coverage of Prolia.  Counseled them to continue to follow-up with PCP.   ---[facility] Pharmacist is unsure how medication would be covered by her insurance---they are aware PCP office is looking into this.    [employer] patient assistance program does not disclose requirements for Medicare Part D beneficiaries.    Pain medication---counseled them they will need to discuss pain medication with prescriber of pain medication.    Plan:  Will route this note to PCP.    Patient/spouse deny other pharmacy related questions at this time.  They have [facility] Pharmacist phone number should new pharmacy related concerns arise.  Will note open pharmacy case and will update [facility] RN.    Karrie Meres, PharmD, [place] [phone]

## 2016-11-08 NOTE — Telephone Encounter (Signed)
Returned call to pt's husband concerning Alexis Higgins's MRI that was scheduled for 11/05/16. He states that the pt was unable to lay on the table due to back pain and is now requesting sedation for her MRI. I told him I would send this to main scheduling, as they schedule MRI's with sedation and they would contact him. He agreed w/ this plan of care. JM

## 2016-11-08 NOTE — Patient Outreach (Signed)
[place] [facility]) Care Management  11/08/16  Alexis Higgins  [birth year] [id number]  Received inbound call from patient and her husband, returning call from last Friday. Patient identification verified,  Patient and her husband were both present on the phone via speakerphone.  Patient's husband reported that last week, they felt certain that patient has another compression fracture. She saw Dr. Jenny Reichmann on Friday who scheduled a MRI for same day. She went as planned, but was unable to complete it due to increased pain. They MRI staff recommended that she have a MRI with sedation, so they called Dr. Arlean Hopping office to request this. Once they got everything arranged, they were told the earliest appointment is July 10th, but were called back and stated that someone had already taken that time and that their appointment would be on July 17th. There is not a call list and they were told they could call back periodically to see if there were any cancellations that would allow her to come in sooner.  They are both very concerned about the length of time having to wait for the MRI because of her increased pain. They reported that although the Tramadol has been helpful, it is not currently helping with her acute pain related this new compression fracture that they believe she has. She began to have increased pain last Thursday that has become typical of her compression fractures. Alexis Higgins stated that he called the pain management office to discuss and her primary doctor there is out x 2 weeks. The nurse Alexis Higgins told them that Dr. Hardin Negus would not likely order anything stronger than the tramadol, and would probably not order anything without seeing her in the office anyway. Her husband stated that they understand the concerns about the breathing and wasn't looking for anything to take with the tramadol, but maybe something stronger to take instead while she is going through this pain until she can have  the procedure for the compression fracture. They did admit that they have been patients at pain management since April of 2003, but have only seen Dr. Hardin Negus a couple of times in the beginning. RNCM advised that he most likely will not write for any new prescription without seeing her because it has been so long since he has seen her. They are concerned about having to pay another copay and were hoping he could just write something without her coming in. They asked for RNCM to help if possible. RNCM advised can call and let them know the circumstances and see what Dr. Hardin Negus says, but most likely, she will have to have an appointment to be seen.   She did receive a new prescription for her tramadol last week and stated that they were very grateful for [facility] help. They stated that Alexis Higgins had called and told them that the doctor had written a new prescription and gave them 180 tablets so that she can now take 2 tablets three times a day. Alexis Higgins stated that they even have a refill and they were surprised that everything had been handled. They indicated that this has been very helpful until the onset of this new acute pain.  Plan: RNCM to reach out to pain management and advise of patient's current condition.  RNCM to contact MRI scheduling every few days to try to get an earlier appointment for MRI. (Contact info: Lars Mage [phone]). Patient to contact RNCM if anything changes and RNCM will touch base in a few days.  Alexis Higgins, Therapist, sports, BSN,  [place] Management Coordinator [phone]

## 2016-11-09 ENCOUNTER — Other Ambulatory Visit: Payer: Self-pay | Admitting: *Deleted

## 2016-11-09 ENCOUNTER — Ambulatory Visit (HOSPITAL_COMMUNITY)
Admission: RE | Admit: 2016-11-09 | Discharge: 2016-11-09 | Disposition: A | Discharge status: Home / Self Care | Payer: Medicare Other | Source: Ambulatory Visit | Attending: Interventional Radiology | Admitting: Interventional Radiology

## 2016-11-09 DIAGNOSIS — M4854XA Collapsed vertebra, not elsewhere classified, thoracic region, initial encounter for fracture: Secondary | ICD-10-CM | POA: Diagnosis not present

## 2016-11-09 DIAGNOSIS — S22019A Unspecified fracture of first thoracic vertebra, initial encounter for closed fracture: Secondary | ICD-10-CM | POA: Diagnosis not present

## 2016-11-09 DIAGNOSIS — M546 Pain in thoracic spine: Secondary | ICD-10-CM | POA: Diagnosis not present

## 2016-11-09 NOTE — Patient Outreach (Signed)
[facility]) Care Management  11/09/2016  Alexis Higgins 12-05-1940 [id number]   CSW was able to make contact with patient's husband, Alexis Higgins today to follow-up regarding social work services and resources for patient.  CSW was also able to confirm that patient and [name] received the packet of resource information mailed to their home.  [name] admitted that they had received it and that he has been able to review the contents of information provided.  [name] denied needing assistance with completion of Advanced Directives ([place] documents).  CSW encouraged [name] to contact CSW directly if additional social work needs arise in the near future.  [name] voiced understanding and was agreeable to this plan. CSW will perform a case closure on patient, as all goals of treatment have been met from social work standpoint and no additional social work needs have been identified at this time.  CSW will notify patient's RNCM with [facility], [name] of CSW's plans to close patient's case.  CSW will fax an update to patient's Primary Care Physician, [name] to ensure that they are aware of CSW's involvement with patient's plan of care.  CSW will submit a case closure request to [name], Care Management Assistant with [facility], in the form of an [employer].  CSW will ensure that [name] is aware of [name]'s, RNCM with [facility], continued involvement with patient's care. [name], BSW, MSW, LCSW  Licensed Education officer, [facility]  Mailing [place] [address], [place], [place] [address] Physical Address-[address], [place], [place] [address] Toll Free Main # [phone] Fax # [phone] Cell # [phone]  Office #  [phone] [email or web address]

## 2016-11-10 ENCOUNTER — Other Ambulatory Visit (HOSPITAL_COMMUNITY): Payer: Self-pay | Admitting: Interventional Radiology

## 2016-11-10 DIAGNOSIS — M4850XA Collapsed vertebra, not elsewhere classified, site unspecified, initial encounter for fracture: Principal | ICD-10-CM

## 2016-11-10 DIAGNOSIS — IMO0001 Reserved for inherently not codable concepts without codable children: Secondary | ICD-10-CM

## 2016-11-10 DIAGNOSIS — M546 Pain in thoracic spine: Secondary | ICD-10-CM

## 2016-11-13 ENCOUNTER — Encounter (HOSPITAL_COMMUNITY): Payer: Self-pay | Admitting: Emergency Medicine

## 2016-11-13 ENCOUNTER — Other Ambulatory Visit: Payer: Self-pay | Admitting: Radiology

## 2016-11-13 ENCOUNTER — Inpatient Hospital Stay (HOSPITAL_COMMUNITY)
Admission: EM | Admit: 2016-11-13 | Discharge: 2016-11-16 | DRG: 516 | Disposition: A | Discharge status: Home / Self Care | Payer: Medicare Other | Attending: Internal Medicine | Admitting: Internal Medicine

## 2016-11-13 DIAGNOSIS — R0902 Hypoxemia: Secondary | ICD-10-CM | POA: Diagnosis not present

## 2016-11-13 DIAGNOSIS — R911 Solitary pulmonary nodule: Secondary | ICD-10-CM | POA: Diagnosis not present

## 2016-11-13 DIAGNOSIS — Z9981 Dependence on supplemental oxygen: Secondary | ICD-10-CM | POA: Diagnosis not present

## 2016-11-13 DIAGNOSIS — E876 Hypokalemia: Secondary | ICD-10-CM | POA: Diagnosis not present

## 2016-11-13 DIAGNOSIS — Z886 Allergy status to analgesic agent status: Secondary | ICD-10-CM

## 2016-11-13 DIAGNOSIS — Z87891 Personal history of nicotine dependence: Secondary | ICD-10-CM | POA: Diagnosis not present

## 2016-11-13 DIAGNOSIS — I2510 Atherosclerotic heart disease of native coronary artery without angina pectoris: Secondary | ICD-10-CM | POA: Diagnosis not present

## 2016-11-13 DIAGNOSIS — J9611 Chronic respiratory failure with hypoxia: Secondary | ICD-10-CM | POA: Diagnosis not present

## 2016-11-13 DIAGNOSIS — I10 Essential (primary) hypertension: Secondary | ICD-10-CM | POA: Diagnosis present

## 2016-11-13 DIAGNOSIS — F418 Other specified anxiety disorders: Secondary | ICD-10-CM | POA: Diagnosis present

## 2016-11-13 DIAGNOSIS — J9610 Chronic respiratory failure, unspecified whether with hypoxia or hypercapnia: Secondary | ICD-10-CM | POA: Diagnosis not present

## 2016-11-13 DIAGNOSIS — Z7952 Long term (current) use of systemic steroids: Secondary | ICD-10-CM

## 2016-11-13 DIAGNOSIS — Z955 Presence of coronary angioplasty implant and graft: Secondary | ICD-10-CM | POA: Diagnosis not present

## 2016-11-13 DIAGNOSIS — F419 Anxiety disorder, unspecified: Secondary | ICD-10-CM | POA: Diagnosis present

## 2016-11-13 DIAGNOSIS — Z885 Allergy status to narcotic agent status: Secondary | ICD-10-CM | POA: Diagnosis not present

## 2016-11-13 DIAGNOSIS — M4854XA Collapsed vertebra, not elsewhere classified, thoracic region, initial encounter for fracture: Secondary | ICD-10-CM | POA: Diagnosis not present

## 2016-11-13 DIAGNOSIS — S22000G Wedge compression fracture of unspecified thoracic vertebra, subsequent encounter for fracture with delayed healing: Secondary | ICD-10-CM | POA: Diagnosis not present

## 2016-11-13 DIAGNOSIS — M549 Dorsalgia, unspecified: Secondary | ICD-10-CM | POA: Diagnosis present

## 2016-11-13 DIAGNOSIS — M545 Low back pain: Secondary | ICD-10-CM | POA: Diagnosis present

## 2016-11-13 DIAGNOSIS — S22059A Unspecified fracture of T5-T6 vertebra, initial encounter for closed fracture: Secondary | ICD-10-CM | POA: Diagnosis not present

## 2016-11-13 DIAGNOSIS — Z79899 Other long term (current) drug therapy: Secondary | ICD-10-CM

## 2016-11-13 DIAGNOSIS — S22000A Wedge compression fracture of unspecified thoracic vertebra, initial encounter for closed fracture: Secondary | ICD-10-CM | POA: Diagnosis present

## 2016-11-13 DIAGNOSIS — Z9109 Other allergy status, other than to drugs and biological substances: Secondary | ICD-10-CM

## 2016-11-13 DIAGNOSIS — J449 Chronic obstructive pulmonary disease, unspecified: Secondary | ICD-10-CM | POA: Diagnosis not present

## 2016-11-13 DIAGNOSIS — F329 Major depressive disorder, single episode, unspecified: Secondary | ICD-10-CM | POA: Diagnosis present

## 2016-11-13 DIAGNOSIS — R1310 Dysphagia, unspecified: Secondary | ICD-10-CM

## 2016-11-13 LAB — CBC WITH DIFFERENTIAL/PLATELET
Basophils Absolute: 0 10*3/uL (ref 0.0–0.1)
Basophils Relative: 0 %
EOS ABS: 0.2 10*3/uL (ref 0.0–0.7)
EOS PCT: 1 %
HCT: 41.2 % (ref 36.0–46.0)
HEMOGLOBIN: 12.7 g/dL (ref 12.0–15.0)
LYMPHS ABS: 2.2 10*3/uL (ref 0.7–4.0)
Lymphocytes Relative: 16 %
MCH: 28.6 pg (ref 26.0–34.0)
MCHC: 30.8 g/dL (ref 30.0–36.0)
MCV: 92.8 fL (ref 78.0–100.0)
MONO ABS: 1.4 10*3/uL — AB (ref 0.1–1.0)
MONOS PCT: 10 %
Neutro Abs: 10.5 10*3/uL — ABNORMAL HIGH (ref 1.7–7.7)
Neutrophils Relative %: 73 %
PLATELETS: 303 10*3/uL (ref 150–400)
RBC: 4.44 MIL/uL (ref 3.87–5.11)
RDW: 14.8 % (ref 11.5–15.5)
WBC: 14.3 10*3/uL — ABNORMAL HIGH (ref 4.0–10.5)

## 2016-11-13 LAB — BASIC METABOLIC PANEL
Anion gap: 10 (ref 5–15)
BUN: 9 mg/dL (ref 6–20)
CALCIUM: 8.5 mg/dL — AB (ref 8.9–10.3)
CHLORIDE: 103 mmol/L (ref 101–111)
CO2: 31 mmol/L (ref 22–32)
CREATININE: 0.78 mg/dL (ref 0.44–1.00)
GFR calc non Af Amer: >60 mL/min (ref >60)
Glucose, Bld: 91 mg/dL (ref 65–99)
Potassium: 3.2 mmol/L — ABNORMAL LOW (ref 3.5–5.1)
SODIUM: 144 mmol/L (ref 135–145)

## 2016-11-13 MED ORDER — TRAMADOL HCL 50 MG PO TABS
100.0000 mg | ORAL_TABLET | Freq: Two times a day (BID) | ORAL | Status: DC
  Administered 2016-11-14 – 2016-11-16 (×3): 100 mg via ORAL
  Filled 2016-11-13 (×4): qty 2

## 2016-11-13 MED ORDER — ISOSORBIDE MONONITRATE ER 30 MG PO TB24
30.0000 mg | ORAL_TABLET | Freq: Every day | ORAL | Status: DC
  Administered 2016-11-13 – 2016-11-16 (×4): 30 mg via ORAL
  Filled 2016-11-13 (×4): qty 1

## 2016-11-13 MED ORDER — METOPROLOL TARTRATE 12.5 MG HALF TABLET
12.5000 mg | ORAL_TABLET | Freq: Two times a day (BID) | ORAL | Status: DC
  Administered 2016-11-13 – 2016-11-16 (×6): 12.5 mg via ORAL
  Filled 2016-11-13 (×6): qty 1

## 2016-11-13 MED ORDER — POLYETHYLENE GLYCOL 3350 17 G PO PACK: 17.0000 g | PACK | Freq: Every day | ORAL | Status: DC | PRN

## 2016-11-13 MED ORDER — ACETAMINOPHEN 325 MG PO TABS: 650.0000 mg | ORAL_TABLET | Freq: Four times a day (QID) | ORAL | Status: DC | PRN

## 2016-11-13 MED ORDER — SODIUM CHLORIDE 0.9 % IV SOLN
INTRAVENOUS | Status: AC
  Administered 2016-11-13: 16:00:00 via INTRAVENOUS

## 2016-11-13 MED ORDER — TRAMADOL HCL 50 MG PO TABS
50.0000 mg | ORAL_TABLET | Freq: Three times a day (TID) | ORAL | Status: DC | PRN
  Administered 2016-11-14: 50 mg via ORAL
  Filled 2016-11-13: qty 1

## 2016-11-13 MED ORDER — ALBUTEROL SULFATE (2.5 MG/3ML) 0.083% IN NEBU
2.5000 mg | INHALATION_SOLUTION | RESPIRATORY_TRACT | Status: DC
  Filled 2016-11-13 (×2): qty 3

## 2016-11-13 MED ORDER — ONDANSETRON HCL 4 MG PO TABS: 4.0000 mg | ORAL_TABLET | Freq: Four times a day (QID) | ORAL | Status: DC | PRN

## 2016-11-13 MED ORDER — ACETAMINOPHEN 500 MG PO TABS: 1000.0000 mg | ORAL_TABLET | Freq: Four times a day (QID) | ORAL | Status: DC | PRN

## 2016-11-13 MED ORDER — VITAMIN D 1000 UNITS PO CAPS: 1000.0000 [IU] | ORAL_CAPSULE | Freq: Every evening | ORAL | Status: DC

## 2016-11-13 MED ORDER — VITAMIN D 1000 UNITS PO TABS
1000.0000 [IU] | ORAL_TABLET | Freq: Every evening | ORAL | Status: DC
  Administered 2016-11-14 – 2016-11-15 (×2): 1000 [IU] via ORAL
  Filled 2016-11-13 (×3): qty 1

## 2016-11-13 MED ORDER — METHOCARBAMOL 1000 MG/10ML IJ SOLN
500.0000 mg | Freq: Four times a day (QID) | INTRAMUSCULAR | Status: DC | PRN
  Filled 2016-11-13 (×2): qty 5

## 2016-11-13 MED ORDER — ONDANSETRON HCL 4 MG/2ML IJ SOLN: 4.0000 mg | Freq: Four times a day (QID) | INTRAMUSCULAR | Status: DC | PRN

## 2016-11-13 MED ORDER — PRAVASTATIN SODIUM 20 MG PO TABS
20.0000 mg | ORAL_TABLET | Freq: Every day | ORAL | Status: DC
  Administered 2016-11-14 – 2016-11-15 (×2): 20 mg via ORAL
  Filled 2016-11-13 (×3): qty 1

## 2016-11-13 MED ORDER — TIOTROPIUM BROMIDE MONOHYDRATE 18 MCG IN CAPS
18.0000 ug | ORAL_CAPSULE | Freq: Every day | RESPIRATORY_TRACT | Status: DC
  Administered 2016-11-14 – 2016-11-16 (×3): 18 ug via RESPIRATORY_TRACT
  Filled 2016-11-13 (×2): qty 5

## 2016-11-13 MED ORDER — NORTRIPTYLINE HCL 10 MG PO CAPS
20.0000 mg | ORAL_CAPSULE | Freq: Every day | ORAL | Status: DC
  Administered 2016-11-13 – 2016-11-15 (×3): 20 mg via ORAL
  Filled 2016-11-13 (×4): qty 2

## 2016-11-13 MED ORDER — HEPARIN SODIUM (PORCINE) 5000 UNIT/ML IJ SOLN
5000.0000 [IU] | Freq: Three times a day (TID) | INTRAMUSCULAR | Status: AC
  Administered 2016-11-13 – 2016-11-14 (×3): 5000 [IU] via SUBCUTANEOUS
  Filled 2016-11-13 (×3): qty 1

## 2016-11-13 MED ORDER — HYDROMORPHONE HCL 1 MG/ML IJ SOLN
1.0000 mg | Freq: Once | INTRAMUSCULAR | Status: AC
  Administered 2016-11-13: 1 mg via INTRAVENOUS
  Filled 2016-11-13: qty 1

## 2016-11-13 MED ORDER — ONDANSETRON HCL 4 MG PO TABS
4.0000 mg | ORAL_TABLET | Freq: Two times a day (BID) | ORAL | Status: DC
  Administered 2016-11-13 – 2016-11-16 (×6): 4 mg via ORAL
  Filled 2016-11-13 (×7): qty 1

## 2016-11-13 MED ORDER — HYDROMORPHONE HCL 1 MG/ML IJ SOLN
0.5000 mg | INTRAMUSCULAR | Status: DC | PRN
Start: 2016-11-13 — End: 2016-11-16
  Administered 2016-11-13 – 2016-11-16 (×20): 0.5 mg via INTRAVENOUS
  Filled 2016-11-13 (×20): qty 1

## 2016-11-13 MED ORDER — POLYVINYL ALCOHOL 1.4 % OP SOLN
1.0000 [drp] | Freq: Every day | OPHTHALMIC | Status: DC | PRN
  Filled 2016-11-13: qty 15

## 2016-11-13 MED ORDER — FUROSEMIDE 20 MG PO TABS
20.0000 mg | ORAL_TABLET | Freq: Every day | ORAL | Status: DC
  Filled 2016-11-13 (×3): qty 1

## 2016-11-13 MED ORDER — ALBUTEROL SULFATE HFA 108 (90 BASE) MCG/ACT IN AERS: 2.0000 | INHALATION_SPRAY | Freq: Four times a day (QID) | RESPIRATORY_TRACT | Status: DC | PRN

## 2016-11-13 MED ORDER — DOCUSATE SODIUM 100 MG PO CAPS: 100.0000 mg | ORAL_CAPSULE | Freq: Every day | ORAL | Status: DC | PRN

## 2016-11-13 MED ORDER — ALBUTEROL SULFATE (2.5 MG/3ML) 0.083% IN NEBU
2.5000 mg | INHALATION_SOLUTION | Freq: Four times a day (QID) | RESPIRATORY_TRACT | Status: DC | PRN
  Administered 2016-11-14: 2.5 mg via RESPIRATORY_TRACT
  Filled 2016-11-13: qty 3

## 2016-11-13 MED ORDER — VITAMIN B-12 1000 MCG PO TABS
1000.0000 ug | ORAL_TABLET | Freq: Every day | ORAL | Status: DC
  Administered 2016-11-13 – 2016-11-15 (×3): 1000 ug via ORAL
  Filled 2016-11-13 (×3): qty 1

## 2016-11-13 MED ORDER — POTASSIUM CHLORIDE CRYS ER 20 MEQ PO TBCR
40.0000 meq | EXTENDED_RELEASE_TABLET | Freq: Two times a day (BID) | ORAL | Status: AC
  Administered 2016-11-13 – 2016-11-14 (×3): 40 meq via ORAL
  Filled 2016-11-13 (×3): qty 2

## 2016-11-13 MED ORDER — ACETAMINOPHEN 650 MG RE SUPP: 650.0000 mg | Freq: Four times a day (QID) | RECTAL | Status: DC | PRN

## 2016-11-13 MED ORDER — NITROGLYCERIN 0.4 MG SL SUBL: 0.4000 mg | SUBLINGUAL_TABLET | SUBLINGUAL | Status: DC | PRN

## 2016-11-13 MED ORDER — PREDNISONE 5 MG PO TABS
15.0000 mg | ORAL_TABLET | Freq: Every day | ORAL | Status: DC
  Administered 2016-11-14 – 2016-11-16 (×3): 15 mg via ORAL
  Filled 2016-11-13 (×3): qty 1

## 2016-11-13 NOTE — ED Provider Notes (Signed)
  Face-to-face evaluation   History: She presents for evaluation of upper back pain. Onset 9 days ago when felt "pop". Since then she has had intractable pain, trouble sleeping and is unable to take enough medicine to help her pain.  She tried taking tramadol 2 pills 3 times a day, but this increased dosage, made it hard for her to breathe.  Because of that she cut the dosage back to 2 pills twice a day.  Physical exam: Elderly, alert, uncomfortable.  No dysarthria or aphasia.  10:50- Placed call To interventional radiology.  We are unable to ascertain whether the patient has a scheduled procedure.  Patient's husband asserts that they are scheduled to come in to the hospital on 11/16/2016 at 9:30 AM for a kyphoplasty, T6.  Admit for pain control  Medical screening examination/treatment/procedure(s) were conducted as a shared visit with non-physician practitioner(s) and myself.  I personally evaluated the patient during the encounter    Daleen Bo, MD 11/13/16 1342

## 2016-11-13 NOTE — ED Triage Notes (Signed)
Per ems, pt from home with fmaily, c/o chronic back pain for years, multiple surgeries on her back, surgery scheduled Tuesday for T6, hx of anxiety. Pt on 2 L [place] for COPD

## 2016-11-13 NOTE — H&P (Signed)
History and Physical    Alexis Higgins FGH:[id number] DOB: [birth year] DOA: 11/13/2016  PCP: Biagio Borg, MD Patient coming from: home  Chief Complaint: intractable back pain  HPI: Alexis Higgins is a 76 y.o. 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 Patient had an MRI 3 days ago revealing acute on chronic T6 compression fracture. Kyphoplasty scheduled for June 26. Patient being admitted for pain control  Information is obtained from the chart and the patient and her husband who is at the bedside. She states that last week her back pain worsened she saw her primary care provider who increased her tramadol. She also had a MRI revealing acute on chronic T6 compression fracture and kyphoplasty scheduled for 4 days from now. Office visit note indicates pain control but patient states tramadol "never control my pain". Husband reports patient can bear weight to pivot but otherwise is nonambulatory. She denies any numbness tingling of her extremities. She denies any incontinence of urine or stool. She denies worsening shortness of breath or cough. She denies chest pain palpitations. She does endorse some worsening lower extremity edema she stopped taking her Lasix. Husband also reports patient has had kyphoplasty on 4 other occasions and has required hospitalization prior to procedure for the last 3 times.   ED Course: The emergency room she is afebrile slightly tachycardic with a blood pressure at the high end of normal. Provided with IV Dilaudid.  Review of Systems: As per HPI otherwise all other systems reviewed and are negative.   Ambulatory Status: She can stand and pivot from bed to chair  otherwise nonambulatory due to pain  Past Medical History:  Diagnosis Date  . Abnormal chest x-ray    03/2012: will need OP f/u.  Marland Kitchen Anginal pain ([place])   . ANXIETY 01/01/2007  . BURSITIS, RIGHT HIP 06/04/2009  . CAD (coronary artery disease)    a. BMS to LAD 2010. b. NSTEMI with DES to LAD for ISR 2011. c. Patent stent 03/2012/Imdur added.  . Cataract   . CHEST PAIN-PRECORDIAL 01/15/2009  . Colon polyps    H/o tubular adenoma of colon  . COPD 01/01/2007   a. Chronic resp failure on home O2.  Marland Kitchen DEPRESSION 01/01/2007  . Eczema 01/08/2011  . GROIN PAIN 06/20/2008  . Headache(784.0) 01/01/2007  . Hemorrhoid   . HYPERTENSION 01/01/2007  . Impaired glucose tolerance 01/07/2011  . LOW BACK PAIN 01/01/2007  . Muscle weakness (generalized) 06/04/2009  . On home oxygen therapy    "2L; 24/7" (09/10/2016)  . OSTEOARTHRITIS, HIP 07/01/2008  . OSTEOPOROSIS 01/01/2007  . Pneumonia 1998  . Rosacea 01/08/2011  . Shortness of breath   . SYNCOPE 01/01/2007  . Thoracic compression fracture ([place])   . TIA (transient ischemic attack)   . TRANSIENT ISCHEMIC ATTACK, HX OF 01/01/2007    Past Surgical History:  Procedure Laterality Date  . ABDOMINAL HYSTERECTOMY    . APPENDECTOMY    . BACK SURGERY    . BREAST ENHANCEMENT SURGERY    . COLONOSCOPY  01/25/2002   tubular adenoma,hemorrhoids, hyperplastic  colon polyps  . COLONOSCOPY  02/17/2005   hemorrhoids  . CORONARY ANGIOPLASTY    . CORONARY STENT PLACEMENT    . HIP ARTHROPLASTY Left 10/01/2013   Procedure: ARTHROPLASTY UNIPOLAR  HIP;  Surgeon: Marianna Payment, MD;  Location: [facility];  Service: Orthopedics;  Laterality: Left;  . HIP SURGERY Left    DR XU     PROXIMAL NECK   . IR GENERIC HISTORICAL  02/19/2016   IR VERTEBROPLASTY CERV/THOR BX INC UNI/BIL INC/INJECT/IMAGING 02/19/2016 Luanne Bras, MD [facility]-INTERV RAD  . IR GENERIC HISTORICAL  07/15/2016   IR KYPHO THORACIC WITH BONE BIOPSY 07/15/2016 Luanne Bras, MD [facility]INTER[facility]  . IR KYPHO EA ADDL  LEVEL THORACIC OR LUMBAR  09/14/2016  . IR RADIOLOGIST EVAL & MGMT  09/22/2016  . IR VERTEBROPLASTY CERV/THOR BX INC UNI/BIL INC/INJECT/IMAGING  10/08/2016  . LEFT HEART CATHETERIZATION WITH CORONARY ANGIOGRAM N/A 04/13/2012   Procedure: LEFT HEART CATHETERIZATION WITH CORONARY ANGIOGRAM;  Surgeon: Burnell Blanks, MD;  Location[facility] C[facility] LAB;  Service: Cardiovascular;  Laterality: N/A;  . OOPHORECTOMY     one ovary  . s/p bilat cataract  2010  . sp lumbar disc surgury     [name]    Social History   Social History  . Marital status: Married    Spouse name: N/A  . Number of children: 4  . Years of education: N/A   Occupational History  . disabled back since 2003    Social History Main Topics  . Smoking status: Former Smoker    Packs/day: 1.50    Years: 51.00    Types: Cigarettes    Quit date: 07/24/2005  . Smokeless tobacco: Never Used  . Alcohol use No  . Drug use: No  . Sexual activity: Not on file   Other Topics Concern  . Not on file   Social History Narrative  . No narrative on file    Allergies  Allergen Reactions  . Aspirin Other (See Comments)     cns bleed risk  . Codeine Anaphylaxis and Rash  . Other Other (See Comments)    Stiolto - severe reaction - caused inability to breath    Family History  Problem Relation Age of Onset  . Osteoporosis Mother   . Heart disease Sister   . Emphysema Sister   . Seizures Sister        epilepsy  . Cardiomyopathy Sister   . Heart attack Sister     Prior to Admission medications   Medication Sig Start Date End Date Taking? Authorizing Provider  acetaminophen (TYLENOL) 500 MG tablet Take 1,000 mg by mouth every 6 (six) hours as needed for moderate pain or headache.   Yes [provider]  ascorbic acid (VITAMIN C) 1000 MG tablet Take 1,000 mg by mouth daily.   Yes [provider]  Cholecalciferol (VITAMIN D) 1000 UNITS capsule Take 1,000 Units by mouth every evening.    Yes [provider]  clopidogrel (PLAVIX) 75 MG tablet TAKE 1 TABLET (75 MG TOTAL) BY MOUTH DAILY. Patient taking differently: Take 75 mg by mouth daily. TAKE 1 TABLET (75 MG TOTAL) BY MOUTH DAILY. 12/11/15  Yes Burnell Blanks, MD  diclofenac sodium (VOLTAREN) 1 % GEL APPLY 4 GRAMS TOPICALLY 4 TIMES DAILY Patient taking differently: APPLY 4 GRAMS TOPICALLY 4 TIMES DAILY AS NEEDED for pain 11/02/16  Yes Biagio Borg, MD  docusate sodium (COLACE) 100 MG capsule Take 100 mg by mouth daily as needed for mild constipation.    Yes [provider]  furosemide (LASIX) 20 MG tablet Take 1 tablet (20 mg total) by mouth daily. 09/29/16  Yes Biagio Borg, MD  isosorbide  mononitrate (IMDUR) 30 MG 24 hr tablet TAKE 0.5 TABLETS (15 MG TOTAL) BY MOUTH DAILY. 12/11/15  Yes Burnell Blanks, MD  lovastatin (MEVACOR) 20 MG tablet TAKE 1 TABLET BY MOUTH AT BEDTIME Patient taking differently: TAKE 20 MG BY MOUTH AT BEDTIME 03/10/16  Yes Biagio Borg, MD  metoprolol tartrate (LOPRESSOR) 25 MG tablet TAKE 12.5 MG BY MOUTH 2 TIMES A DAY 02/10/16  Yes [provider]  nortriptyline (PAMELOR) 10 MG capsule Take 20 mg by mouth at bedtime.   Yes [provider]  ondansetron (ZOFRAN) 4 MG tablet Take 1 tablet (4 mg total) by mouth every 8 (eight) hours as needed for nausea or vomiting. Patient taking differently: Take 4 mg by mouth 2 (two) times daily.  01/06/16  Yes Mackuen, Courteney Lyn, MD  polyethylene glycol (MIRALAX / GLYCOLAX) packet Take 17 g by mouth daily as needed for mild constipation.   Yes [provider]  predniSONE (DELTASONE) 10 MG tablet Take 15 mg by mouth daily with breakfast.   Yes [provider]  PROAIR HFA 108 (90 Base) MCG/ACT inhaler INHALE 2 PUFFS INTO THE LUNGS EVERY 6 (SIX) HOURS AS NEEDED FOR WHEEZING OR SHORTNESS OF BREATH. 07/20/16  Yes Biagio Borg, MD  tiotropium (SPIRIVA HANDIHALER) 18 MCG inhalation capsule Place 1 capsule (18 mcg total)  into inhaler and inhale daily at 6 (six) AM. 05/26/16  Yes Biagio Borg, MD  traMADol (ULTRAM) 50 MG tablet Take 1 tablet (50 mg total) by mouth every 8 (eight) hours as needed. Patient taking differently: Take 50 mg by mouth every 8 (eight) hours as needed for moderate pain.  10/19/16  Yes Biagio Borg, MD  vitamin B-12 (CYANOCOBALAMIN) 1000 MCG tablet Take 1,000 mcg by mouth daily.   Yes [provider]  Lidocaine HCl (PAIN RELIEF ROLL-ON EX) Apply 1 application topically 2 (two) times daily as needed (PAIN).     [provider]  nitroGLYCERIN (NITROSTAT) 0.4 MG SL tablet Place 1 tablet (0.4 mg total) under the tongue every 5 (five) minutes as needed for chest pain (up to 3 doses). 04/13/12   Dunn, Nedra Hai, PA-C  polyvinyl alcohol (LIQUIFILM TEARS) 1.4 % ophthalmic solution Place 1 drop into both eyes daily as needed for dry eyes.     [provider]    Physical Exam: Vitals:   11/13/16 1030 11/13/16 1100 11/13/16 1130 11/13/16 1200  BP: (!) 158/99 128/77 (!) 144/80 135/83  Pulse: 100 100 86 (!) 102  Resp: 13 12 14 14   Temp:      TempSrc:      SpO2: 99% 100% 98% 95%     General:  Appears Slightly anxious but in no acute distress Eyes:  PERRL, EOMI, normal lids, iris ENT:  grossly normal hearing, lips & tongue, because membranes of her mouth are pink slightly dry Neck:  no LAD, masses or thyromegaly Cardiovascular:  Tachycardic but regular, no m/r/g. 1+ LE edema.  Respiratory:  Mild increased work of breathing with conversation. Breath sounds are distant, diffuse rhonchi no crackles Abdomen:  soft, ntnd, positive bowel sounds but sluggish. No guarding or rebounding Skin:  no rash or induration seen on limited exam Musculoskeletal:  grossly normal tone BUE/BLE, good ROM, no bony abnormality Psychiatric:  grossly normal mood and affect, speech fluent and appropriate, AOx3 Neurologic:  CN 2-12 grossly intact, moves all extremities in coordinated fashion,  sensation intact  Labs on Admission: I have personally reviewed following labs and imaging  studies  CBC:  Recent Labs Lab 11/13/16 1157  WBC 14.3*  NEUTROABS 10.5*  HGB 12.7  HCT 41.2  MCV 92.8  PLT 161   Basic Metabolic Panel: No results for input(s): NA, K, CL, CO2, GLUCOSE, BUN, CREATININE, CALCIUM, MG, PHOS in the last 168 hours. GFR: CrCl cannot be calculated (Patient's most recent lab result is older than the maximum 21 days allowed.). Liver Function Tests: No results for input(s): AST, ALT, ALKPHOS, BILITOT, PROT, ALBUMIN in the last 168 hours. No results for input(s): LIPASE, AMYLASE in the last 168 hours. No results for input(s): AMMONIA in the last 168 hours. Coagulation Profile: No results for input(s): INR, PROTIME in the last 168 hours. Cardiac Enzymes: No results for input(s): CKTOTAL, CKMB, CKMBINDEX, TROPONINI in the last 168 hours. BNP (last 3 results) No results for input(s): PROBNP in the last 8760 hours. HbA1C: No results for input(s): HGBA1C in the last 72 hours. CBG: No results for input(s): GLUCAP in the last 168 hours. Lipid Profile: No results for input(s): CHOL, HDL, LDLCALC, TRIG, CHOLHDL, LDLDIRECT in the last 72 hours. Thyroid Function Tests: No results for input(s): TSH, T4TOTAL, FREET4, T3FREE, THYROIDAB in the last 72 hours. Anemia Panel: No results for input(s): VITAMINB12, FOLATE, FERRITIN, TIBC, IRON, RETICCTPCT in the last 72 hours. Urine analysis:    Component Value Date/Time   COLORURINE YELLOW 08/01/2015 1050   APPEARANCEUR CLEAR 08/01/2015 1050   LABSPEC <=1.005 (A) 08/01/2015 1050   PHURINE 6.0 08/01/2015 1050   GLUCOSEU NEGATIVE 08/01/2015 1050   HGBUR NEGATIVE 08/01/2015 1050   BILIRUBINUR NEGATIVE 08/01/2015 1050   KETONESUR NEGATIVE 08/01/2015 1050   PROTEINUR NEGATIVE 11/22/2013 2000   UROBILINOGEN 0.2 08/01/2015 1050   NITRITE NEGATIVE 08/01/2015 1050   LEUKOCYTESUR NEGATIVE 08/01/2015 1050    Creatinine  Clearance: CrCl cannot be calculated (Patient's most recent lab result is older than the maximum 21 days allowed.).  Sepsis Labs: @LABRCNTIP (procalcitonin:4,lacticidven:4) )No results found for this or any previous visit (from the past 240 hour(s)).   Radiological Exams on Admission: No results found.  EKG:   Assessment/Plan Principal Problem:   Intractable back pain Active Problems:   Depression   Essential hypertension   CAD (coronary artery disease)   Chronic respiratory failure (HCC)   Dysphagia   Closed compression fracture of thoracic vertebra (HCC)   Thoracic compression fracture ([place])   #1. Intractable back pain laded to acute on chronic compression fracture at T6 per MRI done 3 days ago. History of T7 T5 T10 compression fractures. She increased her tramadol at home which was not managing her pain. Kyphoplasty scheduled for Tuesday June 26. -Admit -tramadol 100mg  -when necessary IV Dilaudid for breakthrough -prn robaxin -Kyphoplasty scheduled for June 26 and husband has expectation the patient will stay until then -Continue to hold Plavix -physical therapy  #2. Chronic respiratory failure with O2 dependent COPD. On steroids for 2 years. Appears stable at baseline. -Nebulizers -inhalers -Oxygen supplementation -continue prednisone at 15mg  -Incentive spirometry  #3. CAD. Status post stent. Patient denies chest pain. Home medications include metoprolol imdur and pravastatin -continue home meds  4. Hypertension. Only fair control in the emergency department. Patient stopped taking her Lasix due to pain with movement. He does have some lower extremity edema. -Resume Lasix and metoprolol -Monitor -Improved pain control  #5. Leukocytosis. WBCs 14.3. Likely related prednisone. Chest afebrile hemodynamically stable and nontoxic appearing -Obtain urinalysis    DVT prophylaxis: scd Code Status: full  Family Communication: husband at bedside  Disposition  Plan: home  when pain controlled  Consults called: none  Admission status: obs    Radene Gunning MD [facility]  If 7PM-7AM, please contact night-coverage [email or web address] Password [facility]  11/13/2016, 12:33 PM

## 2016-11-13 NOTE — ED Provider Notes (Signed)
[facility] DEPT Provider Note   CSN: [id number] Arrival date & time: 11/13/16  0900     History   Chief Complaint Chief Complaint  Patient presents with  . Back Pain    HPI Alexis Higgins is a 76 y.o. female.  HPI   Pt is a 76 yo female with PMH of COPD on 2L [place] at home, CAD, HTN, osteoporosis, osteoarthritis, TIA, multiple thoracic spine compression fractures who presents to the ED from home with complaint of worsening chronic back pain. Patient reports over the past week she has had pain to her upper back related to a new T6 fracture. Denies radiation. Reports pain is worse with movements. She states she had an MRI performed earlier this week by her neurosurgeon confirming the fracture. She notes she has been taking tramadol which she is prescribed by her pain management physician and Tylenol without relief. Denies any recent fall, trauma or injury. Patient reports her pain is unchanged in nature but states she has not been having any relief at home with her home medications. Denies fever, chills, headache, lightheadedness, dizziness, neck pain, chest pain, shortness of breath, abdominal pain, vomiting, urinary symptoms, urinary or bowel incontinence, saddle anesthesia, numbness, new weakness. Patient reports weakness in her legs at baseline and states she gets around at home using a wheelchair. Husband at bedside reports patient patient is scheduled to have surgery on Tuesday by her neurosurgeon to repair her thoracic compression fracture.  PCP- Dr. Jenny Reichmann   Past Medical History:  Diagnosis Date  . Abnormal chest x-ray    03/2012: will need OP f/u.  Marland Kitchen Anginal pain ([employer])   . ANXIETY 01/01/2007  . BURSITIS, RIGHT HIP 06/04/2009  . CAD (coronary artery disease)    a. BMS to LAD 2010. b. NSTEMI with DES to LAD for ISR 2011. c. Patent stent 03/2012/Imdur added.  . Cataract   . CHEST PAIN-PRECORDIAL 01/15/2009  . Colon polyps    H/o tubular adenoma of colon  . COPD 01/01/2007   a. Chronic resp failure on home O2.  Marland Kitchen DEPRESSION 01/01/2007  . Eczema 01/08/2011  . GROIN PAIN 06/20/2008  . Headache(784.0) 01/01/2007  . Hemorrhoid   . HYPERTENSION 01/01/2007  . Impaired glucose tolerance 01/07/2011  . LOW BACK PAIN 01/01/2007  . Muscle weakness (generalized) 06/04/2009  . On home oxygen therapy    "2L; 24/7" (09/10/2016)  . OSTEOARTHRITIS, HIP 07/01/2008  . OSTEOPOROSIS 01/01/2007  . Pneumonia 1998  . Rosacea 01/08/2011  . Shortness of breath   . SYNCOPE 01/01/2007  . Thoracic compression fracture ([place])   . TIA (transient ischemic attack)   . TRANSIENT ISCHEMIC ATTACK, HX OF 01/01/2007    Patient Active Problem List   Diagnosis Date Noted  . Back pain 10/06/2016  . Fracture of seventh thoracic vertebra ([place]) 10/06/2016  . Non-traumatic compression fracture of T7 thoracic vertebra ([facility])   . Thoracic compression fracture ([place])   . Intractable back pain 09/10/2016  . Chest pain 09/10/2016  . Closed compression fracture of thoracic vertebra ([place])   . 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 Dysphagia Feb 05, 202018  . Peripheral edema 06/15/2016  . Smoker 08/02/2014  . Peripheral vascular disease ([place]) 08/02/2014  . Abdominal pain, LLQ 11/22/2013  . Black stools 11/22/2013  . Heme positive stool 11/22/2013  . Acute blood loss anemia 10/24/2013  . Displaced fracture of left femoral neck ([place]) 09/30/2013  . Fracture of femoral neck, left, closed ([place]) 09/30/2013  . Fall at home 09/30/2013  .  Head contusion 09/30/2013  . Leg weakness, bilateral 07/21/2012  . 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 Unstable angina pectoris ([place]) 04/12/2012  . Chronic respiratory failure ([place]) 02/13/2012  . Localized swelling, mass and lump, neck 01/06/2012  . CAD (coronary artery disease) 12/14/2011  . Dyspnea 11/22/2011  . Personal history of colonic polyps 09/06/2011  . Eczema 01/08/2011  . Rosacea 01/08/2011  . Dizziness - light-headed 01/08/2011  . Encounter for  long-term (current) use of high-risk medication 01/08/2011  . Impaired glucose tolerance 01/07/2011  . Preventative health care 01/07/2011  . BURSITIS, RIGHT HIP 06/04/2009  . CAD, NATIVE VESSEL 01/15/2009  . Other symptoms involving cardiovascular system 01/15/2009  . CHEST PAIN-PRECORDIAL 01/15/2009  . OSTEOARTHRITIS, HIP 07/01/2008  . Hyperlipidemia 01/01/2007  . Anxiety state 01/01/2007  . Depression 01/01/2007  . Essential hypertension 01/01/2007  . COPD (chronic obstructive pulmonary disease) with emphysema ([place]) 01/01/2007  . LOW BACK PAIN 01/01/2007  . Osteoporosis 01/01/2007  . SYNCOPE 01/01/2007  . Headache(784.0) 01/01/2007  . TRANSIENT ISCHEMIC ATTACK, HX OF 01/01/2007    Past Surgical History:  Procedure Laterality Date  . ABDOMINAL HYSTERECTOMY    . APPENDECTOMY    . BACK SURGERY    . BREAST ENHANCEMENT SURGERY    . COLONOSCOPY  01/25/2002   tubular adenoma,hemorrhoids, hyperplastic  colon polyps  . COLONOSCOPY  02/17/2005   hemorrhoids  . CORONARY ANGIOPLASTY    . CORONARY STENT PLACEMENT    . HIP ARTHROPLASTY Left 10/01/2013   Procedure: ARTHROPLASTY UNIPOLAR   HIP;  Surgeon: Marianna Payment, MD;  Location: [facility];  Service: Orthopedics;  Laterality: Left;  . HIP SURGERY Left    DR XU     PROXIMAL NECK   . IR GENERIC HISTORICAL  02/19/2016   IR VERTEBROPLASTY CERV/THOR BX INC UNI/BIL INC/INJECT/IMAGING 02/19/2016 Luanne Bras, MD [facility]INTER[facility]  . IR GENERIC HISTORICAL  07/15/2016   IR KYPHO THORACIC WITH BONE BIOPSY 07/15/2016 Luanne Bras, MD [facility]  . IR KYPHO EA ADDL LEVEL THORACIC OR LUMBAR  09/14/2016  . IR RADIOLOGIST EVAL & MGMT  09/22/2016  . IR VERTEBROPLASTY CERV/THOR BX INC UNI/BIL INC/INJECT/IMAGING  10/08/2016  . LEFT HEART CATHETERIZATION WITH CORONARY ANGIOGRAM N/A 04/13/2012   Procedure: LEFT HEART CATHETERIZATION WITH CORONARY ANGIOGRAM;  Surgeon: Burnell Blanks, MD;  Location: [facility] CATH LAB;  Service: Cardiovascular;   Laterality: N/A;  . OOPHORECTOMY     one ovary  . s/p bilat cataract  2010  . sp lumbar disc surgury     [name]    OB History    No data available       Home Medications    Prior to Admission medications   Medication Sig Start Date End Date Taking? Authorizing Provider  acetaminophen (TYLENOL) 500 MG tablet Take 1,000 mg by mouth every 6 (six) hours as needed for moderate pain or headache.   Yes [provider]  ascorbic acid (VITAMIN C) 1000 MG tablet Take 1,000 mg by mouth daily.   Yes [provider]  Cholecalciferol (VITAMIN D) 1000 UNITS capsule Take 1,000 Units by mouth every evening.    Yes [provider]  clopidogrel (PLAVIX) 75 MG tablet TAKE 1 TABLET (75 MG TOTAL) BY MOUTH DAILY. Patient taking differently: Take 75 mg by mouth daily. TAKE 1 TABLET (75 MG TOTAL) BY MOUTH DAILY. 12/11/15  Yes Burnell Blanks, MD  diclofenac sodium (VOLTAREN) 1 % GEL APPLY 4 GRAMS TOPICALLY 4 TIMES DAILY Patient taking differently: APPLY 4  GRAMS TOPICALLY 4 TIMES DAILY AS NEEDED for pain 11/02/16  Yes Biagio Borg, MD  docusate sodium (COLACE) 100 MG capsule Take 100 mg by mouth daily as needed for mild constipation.    Yes [provider]  furosemide (LASIX) 20 MG tablet Take 1 tablet (20 mg total) by mouth daily. 09/29/16  Yes Biagio Borg, MD  isosorbide mononitrate (IMDUR) 30 MG 24 hr tablet TAKE 0.5 TABLETS (15 MG TOTAL) BY MOUTH DAILY. 12/11/15  Yes Burnell Blanks, MD  lovastatin (MEVACOR) 20 MG tablet TAKE 1 TABLET BY MOUTH AT BEDTIME Patient taking differently: TAKE 20 MG BY MOUTH AT BEDTIME 03/10/16  Yes Biagio Borg, MD  metoprolol tartrate (LOPRESSOR) 25 MG tablet TAKE 12.5 MG BY MOUTH 2 TIMES A DAY 02/10/16  Yes [provider]  nortriptyline (PAMELOR) 10 MG capsule Take 20 mg by mouth at bedtime.   Yes [provider]  ondansetron (ZOFRAN) 4 MG tablet Take 1 tablet (4 mg total) by mouth every 8 (eight)  hours as needed for nausea or vomiting. Patient taking differently: Take 4 mg by mouth 2 (two) times daily.  01/06/16  Yes Mackuen, Courteney Lyn, MD  polyethylene glycol (MIRALAX / GLYCOLAX) packet Take 17 g by mouth daily as needed for mild constipation.   Yes [provider]  predniSONE (DELTASONE) 10 MG tablet Take 15 mg by mouth daily with breakfast.   Yes [provider]  PROAIR HFA 108 (90 Base) MCG/ACT inhaler INHALE 2 PUFFS INTO THE LUNGS EVERY 6 (SIX) HOURS AS NEEDED FOR WHEEZING OR SHORTNESS OF BREATH. 07/20/16  Yes Biagio Borg, MD  tiotropium (SPIRIVA HANDIHALER) 18 MCG inhalation capsule Place 1 capsule (18 mcg total) into inhaler and inhale daily at 6 (six) AM. 05/26/16  Yes Biagio Borg, MD  traMADol (ULTRAM) 50 MG tablet Take 1 tablet (50 mg total) by mouth every 8 (eight) hours as needed. Patient taking differently: Take 50 mg by mouth every 8 (eight) hours as needed for moderate pain.  10/19/16  Yes Biagio Borg, MD  vitamin B-12 (CYANOCOBALAMIN) 1000 MCG tablet Take 1,000 mcg by mouth daily.   Yes [provider]  Lidocaine HCl (PAIN RELIEF ROLL-ON EX) Apply 1 application topically 2 (two) times daily as needed (PAIN).     [provider]  nitroGLYCERIN (NITROSTAT) 0.4 MG SL tablet Place 1 tablet (0.4 mg total) under the tongue every 5 (five) minutes as needed for chest pain (up to 3 doses). 04/13/12   Dunn, Nedra Hai, PA-C  polyvinyl alcohol (LIQUIFILM TEARS) 1.4 % ophthalmic solution Place 1 drop into both eyes daily as needed for dry eyes.     [provider]    Family History Family History  Problem Relation Age of Onset  . Osteoporosis Mother   . Heart disease Sister   . Emphysema Sister   . Seizures Sister        epilepsy  . Cardiomyopathy Sister   . Heart attack Sister     Social History Social History  Substance Use Topics  . Smoking status: Former Smoker    Packs/day: 1.50    Years: 51.00    Types: Cigarettes     Quit date: 07/24/2005  . Smokeless tobacco: Never Used  . Alcohol use No     Allergies   Aspirin; Codeine; and Other   Review of Systems Review of Systems  Musculoskeletal: Positive for back pain.  All other systems reviewed and are negative.  Physical Exam Updated Vital Signs BP 135/83   Pulse (!) 102   Temp 98.2 F (36.8 C) (Oral)   Resp 14   SpO2 95%   Physical Exam  Constitutional: She is oriented to person, place, and time. She appears well-developed and well-nourished. No distress.  HENT:  Head: Normocephalic and atraumatic.  Mouth/Throat: Oropharynx is clear and moist. No oropharyngeal exudate.  Eyes: Conjunctivae and EOM are normal. Right eye exhibits no discharge. Left eye exhibits no discharge. No scleral icterus.  Neck: Normal range of motion. Neck supple.  Cardiovascular: Regular rhythm, normal heart sounds and intact distal pulses.   Mildly tachycardic, HR 102  Pulmonary/Chest: Effort normal and breath sounds normal. No respiratory distress. She has no wheezes. She has no rales. She exhibits no tenderness.  Abdominal: Soft. Bowel sounds are normal. She exhibits no distension and no mass. There is no tenderness. There is no rebound and no guarding.  Musculoskeletal: She exhibits no edema, tenderness or deformity.  No midline C, T, or L tenderness. Full range of motion of neck and back. Full range of motion of bilateral upper and lower extremities, with 5/5 strength. Sensation intact. 2+ radial and PT pulses. Cap refill <2 seconds.   Neurological: She is alert and oriented to person, place, and time. She has normal strength. No sensory deficit.  Skin: Skin is warm and dry. Capillary refill takes less than 2 seconds. She is not diaphoretic.  Nursing note and vitals reviewed.    ED Treatments / Results  Labs (all labs ordered are listed, but only abnormal results are displayed) Labs Reviewed  CBC WITH DIFFERENTIAL/PLATELET  BASIC METABOLIC PANEL    URINALYSIS, ROUTINE W REFLEX MICROSCOPIC    EKG  EKG Interpretation None       Radiology No results found.  Procedures Procedures (including critical care time)  Medications Ordered in ED Medications  nortriptyline (PAMELOR) capsule 20 mg (not administered)  traMADol (ULTRAM) tablet 50 mg (not administered)  furosemide (LASIX) tablet 20 mg (not administered)  predniSONE (DELTASONE) tablet 15 mg (not administered)  albuterol (PROVENTIL HFA;VENTOLIN HFA) 108 (90 Base) MCG/ACT inhaler 2 puff (not administered)  tiotropium (SPIRIVA) inhalation capsule 18 mcg (not administered)  metoprolol tartrate (LOPRESSOR) tablet 12.5 mg (not administered)  pravastatin (PRAVACHOL) tablet 20 mg (not administered)  vitamin B-12 (CYANOCOBALAMIN) tablet 1,000 mcg (not administered)  ondansetron (ZOFRAN) tablet 4 mg (not administered)  polyethylene glycol (MIRALAX / GLYCOLAX) packet 17 g (not administered)  isosorbide mononitrate (IMDUR) 24 hr tablet 30 mg (not administered)  polyvinyl alcohol (LIQUIFILM TEARS) 1.4 % ophthalmic solution 1 drop (not administered)  nitroGLYCERIN (NITROSTAT) SL tablet 0.4 mg (not administered)  docusate sodium (COLACE) capsule 100 mg (not administered)  Vitamin D 1,000 Units (not administered)  heparin injection 5,000 Units (not administered)  0.9 %  sodium chloride infusion (not administered)  acetaminophen (TYLENOL) tablet 650 mg (not administered)    Or  acetaminophen (TYLENOL) suppository 650 mg (not administered)  ondansetron (ZOFRAN) tablet 4 mg (not administered)    Or  ondansetron (ZOFRAN) injection 4 mg (not administered)  HYDROmorphone (DILAUDID) injection 0.5 mg (not administered)  HYDROmorphone (DILAUDID) injection 1 mg (1 mg Intravenous Given 11/13/16 1019)     Initial Impression / Assessment and Plan / ED Course  I have reviewed the triage vital signs and the nursing notes.  Pertinent labs & imaging results that were available during my care of  the patient were reviewed by me and considered in my medical decision making (see chart  for details).     Patient presents with worsening back pain that has been present over the past week since she was diagnosed with a new T6 compression fracture via MRI on 11/09/16. Reports she is scheduled to have kyphoplasty surgery performed on Tuesday (11/16/16). Denies any recent fall, trauma or injury. No back pain red flags. Patient states she is prescribed tramadol through pain management but denies any relief at home. Husband at bedside reports patient is unable to take stronger pain medication at home long-term due to it affecting her breathing. PMH of COPD on 2L [place] at home, CAD, HTN, osteoporosis, osteoarthritis, TIA, multiple thoracic spine compression fractures. VSS. Exam unremarkable. No tenderness over midline spine on examination. Patient unable to stand and ambulate due to reported pain, reports using a wheelchair at home to get around due to her chronic pain and weakness. Pt given pain meds in the ED. Chart review shows MRI thoracic spine performed on 11/09/16 showed acute on chronic T6 compression fx with 70% height loss, noted to have multiple chronic compression fxs. Discussed pt with Dr. Eulis Foster who also evaluated pt in the ED. He spoke with Dr. Estanislado Pandy who states she has been admitted multiple times previously for pain control until she is able to have her surgery performed. Due to pt with worsening pain unable to be controlled with her home meds for her acute T6 compression fx, plan to admit. Consulted hospitalist, NP-Karen Black agrees to admission. Discussed results and plan for admission with pt and family.   Final Clinical Impressions(s) / ED Diagnoses   Final diagnoses:  Non-traumatic compression fracture of sixth thoracic vertebra, initial encounter [facility])    New Prescriptions New Prescriptions   No medications on file     Nona Dell, Hershal Coria 11/13/16 1223    Daleen Bo, MD 11/13/16 [phone]

## 2016-11-13 NOTE — ED Triage Notes (Signed)
Pt husband stating "the only thing that helps her is dilaudid"

## 2016-11-13 NOTE — ED Notes (Signed)
Pt in gown and on monitor 

## 2016-11-13 NOTE — ED Notes (Signed)
Attempted report 

## 2016-11-14 DIAGNOSIS — J449 Chronic obstructive pulmonary disease, unspecified: Secondary | ICD-10-CM | POA: Diagnosis not present

## 2016-11-14 DIAGNOSIS — Z87891 Personal history of nicotine dependence: Secondary | ICD-10-CM | POA: Diagnosis not present

## 2016-11-14 DIAGNOSIS — S22000G Wedge compression fracture of unspecified thoracic vertebra, subsequent encounter for fracture with delayed healing: Secondary | ICD-10-CM | POA: Diagnosis not present

## 2016-11-14 DIAGNOSIS — J9610 Chronic respiratory failure, unspecified whether with hypoxia or hypercapnia: Secondary | ICD-10-CM | POA: Diagnosis present

## 2016-11-14 DIAGNOSIS — E876 Hypokalemia: Secondary | ICD-10-CM | POA: Diagnosis present

## 2016-11-14 DIAGNOSIS — Z955 Presence of coronary angioplasty implant and graft: Secondary | ICD-10-CM | POA: Diagnosis not present

## 2016-11-14 DIAGNOSIS — J9611 Chronic respiratory failure with hypoxia: Secondary | ICD-10-CM | POA: Diagnosis not present

## 2016-11-14 DIAGNOSIS — F329 Major depressive disorder, single episode, unspecified: Secondary | ICD-10-CM | POA: Diagnosis present

## 2016-11-14 DIAGNOSIS — Z79899 Other long term (current) drug therapy: Secondary | ICD-10-CM | POA: Diagnosis not present

## 2016-11-14 DIAGNOSIS — I2510 Atherosclerotic heart disease of native coronary artery without angina pectoris: Secondary | ICD-10-CM | POA: Diagnosis not present

## 2016-11-14 DIAGNOSIS — Z9109 Other allergy status, other than to drugs and biological substances: Secondary | ICD-10-CM | POA: Diagnosis not present

## 2016-11-14 DIAGNOSIS — M546 Pain in thoracic spine: Secondary | ICD-10-CM | POA: Diagnosis not present

## 2016-11-14 DIAGNOSIS — R1310 Dysphagia, unspecified: Secondary | ICD-10-CM | POA: Diagnosis present

## 2016-11-14 DIAGNOSIS — R911 Solitary pulmonary nodule: Secondary | ICD-10-CM | POA: Diagnosis present

## 2016-11-14 DIAGNOSIS — Z7952 Long term (current) use of systemic steroids: Secondary | ICD-10-CM | POA: Diagnosis not present

## 2016-11-14 DIAGNOSIS — Z886 Allergy status to analgesic agent status: Secondary | ICD-10-CM | POA: Diagnosis not present

## 2016-11-14 DIAGNOSIS — M545 Low back pain: Secondary | ICD-10-CM | POA: Diagnosis present

## 2016-11-14 DIAGNOSIS — F419 Anxiety disorder, unspecified: Secondary | ICD-10-CM | POA: Diagnosis present

## 2016-11-14 DIAGNOSIS — Z9981 Dependence on supplemental oxygen: Secondary | ICD-10-CM | POA: Diagnosis not present

## 2016-11-14 DIAGNOSIS — M4854XA Collapsed vertebra, not elsewhere classified, thoracic region, initial encounter for fracture: Secondary | ICD-10-CM | POA: Diagnosis not present

## 2016-11-14 DIAGNOSIS — M549 Dorsalgia, unspecified: Secondary | ICD-10-CM | POA: Diagnosis not present

## 2016-11-14 DIAGNOSIS — I10 Essential (primary) hypertension: Secondary | ICD-10-CM | POA: Diagnosis present

## 2016-11-14 DIAGNOSIS — Z885 Allergy status to narcotic agent status: Secondary | ICD-10-CM | POA: Diagnosis not present

## 2016-11-14 LAB — URINALYSIS, ROUTINE W REFLEX MICROSCOPIC
BACTERIA UA: NONE SEEN
Bilirubin Urine: NEGATIVE
Glucose, UA: NEGATIVE mg/dL
Ketones, ur: NEGATIVE mg/dL
Nitrite: NEGATIVE
PROTEIN: NEGATIVE mg/dL
Specific Gravity, Urine: 1.018 (ref 1.005–1.030)
pH: 6 (ref 5.0–8.0)

## 2016-11-14 MED ORDER — POTASSIUM CHLORIDE CRYS ER 20 MEQ PO TBCR
20.0000 meq | EXTENDED_RELEASE_TABLET | Freq: Once | ORAL | Status: AC
  Administered 2016-11-14: 20 meq via ORAL
  Filled 2016-11-14: qty 1

## 2016-11-14 NOTE — Consult Note (Signed)
Chief Complaint: Patient was seen in consultation today for Thoracic 6 Vertebroplasty Chief Complaint  Patient presents with  . Back Pain   at the request of Dr Georges Mouse  Referring Physician(s): Dr Georges Mouse  Supervising Physician: Luanne Bras  Patient Status: [facility] - In-pt  History of Present Illness: Alexis Higgins is a 76 y.o. female   Pt is well known to [facility] Has had several VP/KP procedure in past (Hx COPD- chronic steroid use) Was scheduled for OP Thoracic 6 vertebroplasty on 6/26 Now IP secondary pain --- admitted for pain control  MRI 6/16: IMPRESSION: 1. Acute on chronic T6 compression fracture with 70% height loss. 2. Multiple chronic compression fractures. Interval T7 augmentation.  We are aware of her admission Will attempt to perform T6 VP as IP tomorrow if schedule will allow. [employer] will also have to be re approved as In patient)  Pt and husband aware and agreeable Last dose Plavix 10 days ago  Past Medical History:  Diagnosis Date  . Abnormal chest x-ray    03/2012: will need OP f/u.  Marland Kitchen Anginal pain ([place])   . ANXIETY 01/01/2007  . BURSITIS, RIGHT HIP 06/04/2009  . CAD (coronary artery disease)    a. BMS to LAD 2010. b. NSTEMI with DES to LAD for ISR 2011. c. Patent stent 03/2012/Imdur added.  . Cataract   . CHEST PAIN-PRECORDIAL 01/15/2009  . Colon polyps    H/o tubular adenoma of colon  . COPD 01/01/2007   a. Chronic resp failure on home O2.  Marland Kitchen DEPRESSION 01/01/2007  . Eczema 01/08/2011  . GROIN PAIN 06/20/2008  . Headache(784.0) 01/01/2007  . Hemorrhoid   . HYPERTENSION 01/01/2007  . Impaired glucose tolerance 01/07/2011  . LOW BACK PAIN 01/01/2007  . Muscle weakness (generalized) 06/04/2009  . On home oxygen therapy    "2L; 24/7" (09/10/2016)  . OSTEOARTHRITIS, HIP 07/01/2008  . OSTEOPOROSIS 01/01/2007  . Pneumonia 1998  . Rosacea 01/08/2011  . Shortness of breath   . SYNCOPE 01/01/2007  . Thoracic compression fracture ([place])   . TIA  (transient ischemic attack)   . TRANSIENT ISCHEMIC ATTACK, HX OF 01/01/2007    Past Surgical History:  Procedure Laterality Date  . ABDOMINAL HYSTERECTOMY    . APPENDECTOMY    . BACK SURGERY    . BREAST ENHANCEMENT SURGERY    . COLONOSCOPY  01/25/2002   tubular adenoma,hemorrhoids, hyperplastic  colon polyps  . COLONOSCOPY  02/17/2005   hemorrhoids  . CORONARY ANGIOPLASTY    . CORONARY STENT PLACEMENT    . HIP ARTHROPLASTY Left 10/01/2013   Procedure: ARTHROPLASTY UNIPOLAR   HIP;  Surgeon: Marianna Payment, MD;  Location: [facility];  Service: Orthopedics;  Laterality: Left;  . HIP SURGERY Left    DR XU     PROXIMAL NECK   . IR GENERIC HISTORICAL  02/19/2016   IR VERTEBROPLASTY CERV/THOR BX INC UNI/BIL INC/INJECT/IMAGING 02/19/2016 Luanne Bras, MD [facility]-INTERV RAD  . IR GENERIC HISTORICAL  07/15/2016   IR KYPHO THORACIC WITH BONE BIOPSY 07/15/2016 Luanne Bras, MD [facility]INTER[facility]  . IR KYPHO EA ADDL LEVEL THORACIC OR LUMBAR  09/14/2016  . IR RADIOLOGIST EVAL & MGMT  09/22/2016  . IR VERTEBROPLASTY CERV/THOR BX INC UNI/BIL INC/INJECT/IMAGING  10/08/2016  . LEFT HEART CATHETERIZATION WITH CORONARY ANGIOGRAM N/A 04/13/2012   Procedure: LEFT HEART CATHETERIZATION WITH CORONARY ANGIOGRAM;  Surgeon: Burnell Blanks, MD;  Location: [facility] CATH LAB;  Service: Cardiovascular;  Laterality: N/A;  . OOPHORECTOMY  one ovary  . s/p bilat cataract  2010  . sp lumbar disc surgury     [name]    Allergies: Aspirin; Codeine; and Other  Medications: Prior to Admission medications   Medication Sig Start Date End Date Taking? Authorizing Provider  acetaminophen (TYLENOL) 500 MG tablet Take 1,000 mg by mouth every 6 (six) hours as needed for moderate pain or headache.   Yes [provider]  ascorbic acid (VITAMIN C) 1000 MG tablet Take 1,000 mg by mouth daily.   Yes [provider]  Cholecalciferol (VITAMIN D) 1000 UNITS capsule Take 1,000 Units by mouth every evening.     Yes [provider]  clopidogrel (PLAVIX) 75 MG tablet TAKE 1 TABLET (75 MG TOTAL) BY MOUTH DAILY. Patient taking differently: Take 75 mg by mouth daily. TAKE 1 TABLET (75 MG TOTAL) BY MOUTH DAILY. 12/11/15  Yes Burnell Blanks, MD  diclofenac sodium (VOLTAREN) 1 % GEL APPLY 4 GRAMS TOPICALLY 4 TIMES DAILY Patient taking differently: APPLY 4 GRAMS TOPICALLY 4 TIMES DAILY AS NEEDED for pain 11/02/16  Yes Biagio Borg, MD  docusate sodium (COLACE) 100 MG capsule Take 100 mg by mouth daily as needed for mild constipation.    Yes [provider]  furosemide (LASIX) 20 MG tablet Take 1 tablet (20 mg total) by mouth daily. 09/29/16  Yes Biagio Borg, MD  isosorbide mononitrate (IMDUR) 30 MG 24 hr tablet TAKE 0.5 TABLETS (15 MG TOTAL) BY MOUTH DAILY. 12/11/15  Yes Burnell Blanks, MD  lovastatin (MEVACOR) 20 MG tablet TAKE 1 TABLET BY MOUTH AT BEDTIME Patient taking differently: TAKE 20 MG BY MOUTH AT BEDTIME 03/10/16  Yes Biagio Borg, MD  metoprolol tartrate (LOPRESSOR) 25 MG tablet TAKE 12.5 MG BY MOUTH 2 TIMES A DAY 02/10/16  Yes [provider]  nortriptyline (PAMELOR) 10 MG capsule Take 20 mg by mouth at bedtime.   Yes [provider]  ondansetron (ZOFRAN) 4 MG tablet Take 1 tablet (4 mg total) by mouth every 8 (eight) hours as needed for nausea or vomiting. Patient taking differently: Take 4 mg by mouth 2 (two) times daily.  01/06/16  Yes Mackuen, Courteney Lyn, MD  polyethylene glycol (MIRALAX / GLYCOLAX) packet Take 17 g by mouth daily as needed for mild constipation.   Yes [provider]  predniSONE (DELTASONE) 10 MG tablet Take 15 mg by mouth daily with breakfast.   Yes [provider]  PROAIR HFA 108 (90 Base) MCG/ACT inhaler INHALE 2 PUFFS INTO THE LUNGS EVERY 6 (SIX) HOURS AS NEEDED FOR WHEEZING OR SHORTNESS OF BREATH. 07/20/16  Yes Biagio Borg, MD  tiotropium (SPIRIVA HANDIHALER) 18 MCG inhalation capsule Place 1 capsule (18  mcg total) into inhaler and inhale daily at 6 (six) AM. 05/26/16  Yes Biagio Borg, MD  traMADol (ULTRAM) 50 MG tablet Take 1 tablet (50 mg total) by mouth every 8 (eight) hours as needed. Patient taking differently: Take 50 mg by mouth every 8 (eight) hours as needed for moderate pain.  10/19/16  Yes Biagio Borg, MD  vitamin B-12 (CYANOCOBALAMIN) 1000 MCG tablet Take 1,000 mcg by mouth daily.   Yes [provider]  Lidocaine HCl (PAIN RELIEF ROLL-ON EX) Apply 1 application topically 2 (two) times daily as needed (PAIN).     [provider]  nitroGLYCERIN (NITROSTAT) 0.4 MG SL tablet Place 1 tablet (0.4 mg total) under the tongue every 5 (five) minutes as needed for chest pain (  up to 3 doses). 04/13/12   Dunn, Nedra Hai, PA-C  polyvinyl alcohol (LIQUIFILM TEARS) 1.4 % ophthalmic solution Place 1 drop into both eyes daily as needed for dry eyes.     [provider]     Family History  Problem Relation Age of Onset  . Osteoporosis Mother   . Heart disease Sister   . Emphysema Sister   . Seizures Sister        epilepsy  . Cardiomyopathy Sister   . Heart attack Sister     Social History   Social History  . Marital status: Married    Spouse name: N/A  . Number of children: 4  . Years of education: N/A   Occupational History  . disabled back since 2003    Social History Main Topics  . Smoking status: Former Smoker    Packs/day: 1.50    Years: 51.00    Types: Cigarettes    Quit date: 07/24/2005  . Smokeless tobacco: Never Used  . Alcohol use No  . Drug use: No  . Sexual activity: Not Asked   Other Topics Concern  . None   Social History Narrative  . None    Review of Systems: A 12 point ROS discussed and pertinent positives are indicated in the HPI above.  All other systems are negative.  Review of Systems  Constitutional: Positive for activity change, appetite change and fatigue. Negative for fever.  Cardiovascular: Negative for chest pain.    Gastrointestinal: Negative for abdominal pain.  Musculoskeletal: Positive for back pain and gait problem.  Neurological: Positive for weakness.  Psychiatric/Behavioral: Negative for behavioral problems and confusion.    Vital Signs: BP (!) 142/75 (BP Location: Left Arm)   Pulse 100   Temp 98.4 F (36.9 C) (Oral)   Resp 16   SpO2 96%   Physical Exam  Constitutional: She is oriented to person, place, and time. She appears well-nourished.  Cardiovascular: Normal rate and regular rhythm.   Pulmonary/Chest: Effort normal and breath sounds normal.  Abdominal: Soft. Bowel sounds are normal.  Musculoskeletal: Normal range of motion.  Back pain mid back  Neurological: She is alert and oriented to person, place, and time.  Skin: Skin is warm and dry.  Psychiatric: She has a normal mood and affect. Her behavior is normal. Judgment and thought content normal.  Nursing note and vitals reviewed.   Mallampati Score:  MD Evaluation Airway: WNL Heart: WNL Abdomen: WNL Chest/ Lungs: WNL ASA  Classification: 3 Mallampati/Airway Score: One  Imaging: Mr Thoracic Spine Wo Contrast  Result Date: 11/10/2016 CLINICAL DATA:  Acute thoracic back pain. EXAM: MRI THORACIC SPINE WITHOUT CONTRAST TECHNIQUE: Multiplanar, multisequence MR imaging of the thoracic spine was performed. No intravenous contrast was administered. COMPARISON:  10/05/2016 FINDINGS: The examination is moderately to severely motion degraded throughout. Alignment: Exaggerated thoracic kyphosis. No significant listhesis. Vertebrae: Chronic T5, T10, and T11 compression fractures are again seen status post augmentation. There has been interval augmentation of a T7 compression fracture with mild-to-moderate marrow edema remaining. There is an acute on chronic T6 compression fracture primarily involving the inferior endplate with approximately 70% height loss and moderate marrow edema. There is no evidence of significant retropulsion.  Abnormal STIR hyperintensity in the right-sided posterior elements at T9 is unchanged and suggestive of a hemangioma. Cord: Limited evaluation due to motion artifact. No gross signal abnormality. Paraspinal and other soft tissues: Grossly unremarkable. Disc levels: Detailed assessment limited by motion artifact. Mild thoracic disc degeneration without  high-grade spinal stenosis. IMPRESSION: 1. Acute on chronic T6 compression fracture with 70% height loss. 2. Multiple chronic compression fractures. Interval T7 augmentation. Electronically Signed   By: Logan Bores M.D.   On: 11/10/2016 07:57    Labs:  CBC:  Recent Labs  10/06/16 0005 10/07/16 0444 10/08/16 0617 11/13/16 1157  WBC 14.2* 12.4* 11.7* 14.3*  HGB 11.1* 12.1 11.8* 12.7  HCT 35.0* 38.3 39.2 41.2  PLT 214 249 250 303    COAGS:  Recent Labs  02/19/16 0823 07/15/16 1016 09/14/16 0850 10/06/16 0957  INR 0.89 0.85 0.92 0.80  APTT 22* 22*  --  24    BMP:  Recent Labs  09/13/16 0505 10/06/16 0005 10/07/16 0444 11/13/16 1157  NA 138 140 140 144  K 4.6 3.5 4.0 3.2*  CL 97* 100* 99* 103  CO2 34* 30 30 31   GLUCOSE 124* 79 85 91  BUN 13 12 16 9   CALCIUM 8.7* 8.6* 8.8* 8.5*  CREATININE 0.99 0.81 0.97 0.78  GFRNONAA 54* >60 56* >60  GFRAA >60 >60 >60 >60    LIVER FUNCTION TESTS:  Recent Labs  01/06/16 1120 02/05/16 0856  BILITOT 0.4 0.4  AST 18 13  ALT 16 11  ALKPHOS 57 78  PROT 5.7* 6.1  ALBUMIN 3.4* 3.7    TUMOR MARKERS: No results for input(s): AFPTM, CEA, CA199, CHROMGRNA in the last 8760 hours.  Assessment and Plan:  New thoracic 6 painful fracture Was scheduled for Out Pt T6 vertebroplasty 6/26----now IP for pain control IR aware of admission Will attempt to perform procedure 6/25 as IP if schedule allows ---will proceed 6/26 if needed  (scheduler also re approving Insurance as inpatient procedure) Risks and Benefits discussed with the patient including, but not limited to education  regarding the natural healing process of compression fractures without intervention, bleeding, infection, cement migration which may cause spinal cord damage, paralysis, pulmonary embolism or even death. All of the patient's questions were answered, patient is agreeable to proceed. Consent signed and in chart.  Thank you for this interesting consult.  I greatly enjoyed meeting Alexis Higgins and look forward to participating in their care.  A copy of this report was sent to the requesting provider on this date.  Electronically Signed: Lavonia Drafts, PA-C 11/14/2016, 12:58 PM   I spent a total of 40 Minutes    in face to face in clinical consultation, greater than 50% of which was counseling/coordinating care for T6 VP

## 2016-11-14 NOTE — Progress Notes (Signed)
Patient ID: Alexis Higgins, female   DOB: 1940-12-10, 76 y.o.   MRN: [id number]                                                                PROGRESS NOTE                                                                                                                                                                                                             Patient Demographics:    Alexis Higgins, is a 76 y.o. female, DOB - [birth year], WUJ:[id number]  Admit date - 11/13/2016   Admitting Physician Elwin Mocha, MD  Outpatient Primary MD for the patient is Biagio Borg, MD  LOS - 0  Outpatient Specialists:   Luanne Bras (IR), [place] (pulmonary)  Chief Complaint  Patient presents with  . Back Pain       Brief Narrative     76 y.o. 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 Patient had an MRI 3 days ago revealing acute on chronic T6 compression fracture. Kyphoplasty scheduled for June 26. Patient being admitted for pain control  Information is obtained from the chart and the patient and her husband who is at the bedside. She states that last week her back pain worsened she saw her primary care provider who increased her tramadol. She also had a MRI revealing acute on chronic T6 compression fracture and kyphoplasty scheduled for 4 days from now. Office visit note indicates pain control but patient states tramadol "never control my pain". Husband reports patient can bear weight to pivot but otherwise is nonambulatory. She denies any numbness tingling of her extremities. She denies any incontinence of urine or stool. She denies worsening shortness of breath or cough. She denies chest pain  palpitations. She does endorse some worsening lower extremity edema she stopped taking her Lasix. Husband also reports patient has had kyphoplasty on 4 other occasions and has required hospitalization prior to procedure for the last 3 times.   ED Course: The emergency room she is afebrile slightly tachycardic with a blood pressure  at the high end of normal. Provided with IV Dilaudid.   Subjective:    [place] today has, No headache, No chest pain, No abdominal pain - No Nausea, No new weakness tingling or numbness, No Cough - SOB. *   Assessment  & Plan :    Principal Problem:   Intractable back pain Active Problems:   Depression   Essential hypertension   CAD (coronary artery disease)   Chronic respiratory failure (HCC)   Dysphagia   Closed compression fracture of thoracic vertebra (HCC)   Thoracic compression fracture ([facility])   Intractable back pain laded to acute on chronic compression fracture at T6 per MRI done 6/20 History of T7 T5 T10 compression fractures. She increased her tramadol at home which was not managing her pain. Kyphoplasty scheduled for Tuesday June 26. tramadol 100mg , iv dilaudid prn pain Robaxin prn Kyphoplasty scheduled for June 26, they will try to do her tomorrow if the schedule allow Spoke with Juliet Rude about this keep her NPO after midnite Hold plavix  Osteoporosis Please consider FORTEO as outpatient  Chronic respiratory failure with O2 dependent COPD. On steroids for 2 years. Appears stable at baseline. Continue on o2 Continue prednisone at 15mg  Incentive spirometry  Hypokalemia repleted Check cmp in am  CAD. Status post stent. Patient denies chest pain. Home medications include metoprolol imdur and pravastatin continue home meds  Hypertension. Only fair control in the emergency department. Patient stopped taking her Lasix  Lasix was restarted on admission Continue home bp medications Hydralazine prn   Leukocytosis. WBCs  14.3. Likely related prednisone. Chest afebrile hemodynamically stable and nontoxic appearing ua pending  Pulmonary nodule 66mm  Can have f/u as outpatient  DVT prophylaxis: scd Code Status: full  Family Communication: husband at bedside  Disposition Plan: home when pain controlled , s/p vertroplasty/kyphoplasty Consults called: none  Admission status: obs       Lab Results  Component Value Date   PLT 303 11/13/2016    Antibiotics  :    Anti-infectives    None        Objective:   Vitals:   11/13/16 1500 11/13/16 2029 11/14/16 0448 11/14/16 0855  BP: 133/89 121/76 (!) 142/75   Pulse: (!) 107 (!) 115 100   Resp: 18 16 16    Temp: 98.1 F (36.7 C)  98.4 F (36.9 C)   TempSrc: Oral  Oral   SpO2: 100% 100% 96% 96%    Wt Readings from Last 3 Encounters:  11/05/16 62.1 kg (137 lb)  10/09/16 67.1 kg (148 lb)  09/29/16 67.1 kg (148 lb)     Intake/Output Summary (Last 24 hours) at 11/14/16 1001 Last data filed at 11/14/16 0900  Gross per 24 hour  Intake              480 ml  Output                0 ml  Net              480 ml     Physical Exam  Awake Alert, Oriented X 3, No new F.N deficits, Normal affect [place].AT,PERRAL Supple Neck,No JVD, No cervical lymphadenopathy appriciated.  Symmetrical Chest wall movement, Good air movement bilaterally, CTAB, prolonged exp phase RRR,No Gallops,Rubs or new Murmurs, No Parasternal Heave +ve B.Sounds, Abd Soft, No tenderness, No organomegaly appriciated, No rebound - guarding or rigidity. No Cyanosis, Clubbing or edema, No new Rash or bruise      Data Review:  CBC  Recent Labs Lab 11/13/16 1157  WBC 14.3*  HGB 12.7  HCT 41.2  PLT 303  MCV 92.8  MCH 28.6  MCHC 30.8  RDW 14.8  LYMPHSABS 2.2  MONOABS 1.4*  EOSABS 0.2  BASOSABS 0.0    Chemistries   Recent Labs Lab 11/13/16 1157  NA 144  K 3.2*  CL 103  CO2 31  GLUCOSE 91  BUN 9  CREATININE 0.78  CALCIUM 8.5*    ------------------------------------------------------------------------------------------------------------------ No results for input(s): CHOL, HDL, LDLCALC, TRIG, CHOLHDL, LDLDIRECT in the last 72 hours.  Lab Results  Component Value Date   HGBA1C 5.7 02/05/2016   ------------------------------------------------------------------------------------------------------------------ No results for input(s): TSH, T4TOTAL, T3FREE, THYROIDAB in the last 72 hours.  Invalid input(s): FREET3 ------------------------------------------------------------------------------------------------------------------ No results for input(s): VITAMINB12, FOLATE, FERRITIN, TIBC, IRON, RETICCTPCT in the last 72 hours.  Coagulation profile No results for input(s): INR, PROTIME in the last 168 hours.  No results for input(s): DDIMER in the last 72 hours.  Cardiac Enzymes No results for input(s): CKMB, TROPONINI, MYOGLOBIN in the last 168 hours.  Invalid input(s): CK ------------------------------------------------------------------------------------------------------------------ No results found for: BNP  Inpatient Medications  Scheduled Meds: . cholecalciferol  1,000 Units Oral QPM  . furosemide  20 mg Oral Daily  . isosorbide mononitrate  30 mg Oral Daily  . metoprolol tartrate  12.5 mg Oral BID  . nortriptyline  20 mg Oral QHS  . ondansetron  4 mg Oral BID  . pravastatin  20 mg Oral q1800  . predniSONE  15 mg Oral Q breakfast  . tiotropium  18 mcg Inhalation Q0600  . traMADol  100 mg Oral Q12H  . vitamin B-12  1,000 mcg Oral Daily   Continuous Infusions: . methocarbamol (ROBAXIN)  IV     PRN Meds:.acetaminophen **OR** acetaminophen, albuterol, docusate sodium, HYDROmorphone (DILAUDID) injection, methocarbamol (ROBAXIN)  IV, nitroGLYCERIN, polyethylene glycol, polyvinyl alcohol, traMADol  Micro Results No results found for this or any previous visit (from the past 240 hour(s)).  Radiology  Reports Mr Thoracic Spine Wo Contrast  Result Date: 11/10/2016 CLINICAL DATA:  Acute thoracic back pain. EXAM: MRI THORACIC SPINE WITHOUT CONTRAST TECHNIQUE: Multiplanar, multisequence MR imaging of the thoracic spine was performed. No intravenous contrast was administered. COMPARISON:  10/05/2016 FINDINGS: The examination is moderately to severely motion degraded throughout. Alignment: Exaggerated thoracic kyphosis. No significant listhesis. Vertebrae: Chronic T5, T10, and T11 compression fractures are again seen status post augmentation. There has been interval augmentation of a T7 compression fracture with mild-to-moderate marrow edema remaining. There is an acute on chronic T6 compression fracture primarily involving the inferior endplate with approximately 70% height loss and moderate marrow edema. There is no evidence of significant retropulsion. Abnormal STIR hyperintensity in the right-sided posterior elements at T9 is unchanged and suggestive of a hemangioma. Cord: Limited evaluation due to motion artifact. No gross signal abnormality. Paraspinal and other soft tissues: Grossly unremarkable. Disc levels: Detailed assessment limited by motion artifact. Mild thoracic disc degeneration without high-grade spinal stenosis. IMPRESSION: 1. Acute on chronic T6 compression fracture with 70% height loss. 2. Multiple chronic compression fractures. Interval T7 augmentation. Electronically Signed   By: Logan Bores M.D.   On: 11/10/2016 07:57    Time Spent in minutes  30   Jani Gravel M.D on 11/14/2016 at 10:01 AM  Between 7am to 7pm - Pager - [phone]  After 7pm go to [email or web address] - password [facility]  [facility] -  Office  [phone]

## 2016-11-15 ENCOUNTER — Encounter (HOSPITAL_COMMUNITY): Payer: Self-pay | Admitting: General Practice

## 2016-11-15 DIAGNOSIS — M549 Dorsalgia, unspecified: Secondary | ICD-10-CM

## 2016-11-15 DIAGNOSIS — I2510 Atherosclerotic heart disease of native coronary artery without angina pectoris: Secondary | ICD-10-CM

## 2016-11-15 DIAGNOSIS — J9611 Chronic respiratory failure with hypoxia: Secondary | ICD-10-CM

## 2016-11-15 LAB — COMPREHENSIVE METABOLIC PANEL
ALK PHOS: 96 U/L (ref 38–126)
ALT: 12 U/L — AB (ref 14–54)
AST: 19 U/L (ref 15–41)
Albumin: 2.7 g/dL — ABNORMAL LOW (ref 3.5–5.0)
Anion gap: 8 (ref 5–15)
BILIRUBIN TOTAL: 0.7 mg/dL (ref 0.3–1.2)
BUN: 9 mg/dL (ref 6–20)
CALCIUM: 8.8 mg/dL — AB (ref 8.9–10.3)
CO2: 34 mmol/L — ABNORMAL HIGH (ref 22–32)
CREATININE: 0.75 mg/dL (ref 0.44–1.00)
Chloride: 99 mmol/L — ABNORMAL LOW (ref 101–111)
GFR calc Af Amer: >60 mL/min (ref >60)
Glucose, Bld: 120 mg/dL — ABNORMAL HIGH (ref 65–99)
Potassium: 4.2 mmol/L (ref 3.5–5.1)
Sodium: 141 mmol/L (ref 135–145)
TOTAL PROTEIN: 5 g/dL — AB (ref 6.5–8.1)

## 2016-11-15 LAB — CBC
HEMATOCRIT: 37.9 % (ref 36.0–46.0)
Hemoglobin: 11.3 g/dL — ABNORMAL LOW (ref 12.0–15.0)
MCH: 28.4 pg (ref 26.0–34.0)
MCHC: 29.8 g/dL — ABNORMAL LOW (ref 30.0–36.0)
MCV: 95.2 fL (ref 78.0–100.0)
Platelets: 297 10*3/uL (ref 150–400)
RBC: 3.98 MIL/uL (ref 3.87–5.11)
RDW: 15.1 % (ref 11.5–15.5)
WBC: 8.3 10*3/uL (ref 4.0–10.5)

## 2016-11-15 LAB — PROTIME-INR
INR: 0.87
PROTHROMBIN TIME: 11.8 s (ref 11.4–15.2)

## 2016-11-15 LAB — APTT: aPTT: 28 seconds (ref 24–36)

## 2016-11-15 MED ORDER — HYDRALAZINE HCL 20 MG/ML IJ SOLN: 5.0000 mg | Freq: Four times a day (QID) | INTRAMUSCULAR | Status: DC | PRN

## 2016-11-15 MED ORDER — CEFAZOLIN SODIUM-DEXTROSE 2-4 GM/100ML-% IV SOLN
2.0000 g | INTRAVENOUS | Status: AC
  Administered 2016-11-16: 2 g via INTRAVENOUS

## 2016-11-15 NOTE — Progress Notes (Signed)
Patient ID: ANAE HAMS, female   DOB: [birth year], 76 y.o.   MRN: [id number]                                                                PROGRESS NOTE                                                                                                                                                                                                             Patient Demographics:    Alexis Higgins, is a 76 y.o. female, DOB - 16-Jul-1940, WUJ:[id number]  Admit date - 11/13/2016   Admitting Physician Elwin Mocha, MD  Outpatient Primary MD for the patient is Biagio Borg, MD  LOS - 1  Outpatient Specialists:   Luanne Bras (IR), [place]rum (pulmonary)  Chief Complaint  Patient presents with  . Back Pain       Brief Narrative     76 y.o. 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 Patient had an MRI 3 days ago revealing acute on chronic T6 compression fracture. Kyphoplasty scheduled for June 26. Patient being admitted for pain control      Subjective:    Alexis Higgins husband is by the bedside, patient is lying comfortably, has a lot of pain when she moves   Assessment  & Plan :    Principal Problem:   Intractable back pain Active Problems:   Depression   Essential hypertension   CAD (coronary artery disease)   Chronic respiratory failure (HCC)   Dysphagia   Closed compression fracture of thoracic vertebra (HCC)   Thoracic compression fracture (HCC)   Back pain   Intractable back pain  Due to acute on chronic compression fracture at T6 per MRI done 6/20 History of T7 T5 T10 compression fractures. She increased her tramadol at home which was not managing her pain.  Kyphoplasty scheduled for Tuesday June 26. tramadol 100mg , iv dilaudid prn pain Robaxin prn Kyphoplasty scheduled for June 26 as outpatient, IR will attempt to perform procedure 6/25 as inpatient Hold plavix  Osteoporosis Please consider [facility] as  outpatient  Chronic respiratory failure with O2 dependent COPD. On steroids for 2 years. Appears stable at baseline. Continue prednisone 15 mg Continue on o2 Incentive spirometry  Hypokalemia Repleted   CAD. Status post stent. Patient denies chest pain. Home medications include metoprolol imdur and pravastatin continue home meds  Hypertension.  Stable, on Lasix, Imdur, metoprolol Hydralazine prn   Leukocytosis. WBCs 14.3. Likely related prednisone. Chest afebrile hemodynamically stable and nontoxic appearing ua negative   Pulmonary nodule 5mm  Can have f/u as outpatient  DVT prophylaxis: scd Code Status: full  Family Communication: husband at bedside  Disposition Plan: home when pain controlled , s/p vertroplasty/kyphoplasty Consults called: none  Admission status: obs       Lab Results  Component Value Date   PLT 297 11/15/2016    Antibiotics  :    Anti-infectives    None        Objective:   Vitals:   11/14/16 1500 11/14/16 2028 11/15/16 0607 11/15/16 0902  BP: (!) 145/81 137/80 133/73 (!) 146/77  Pulse: 88 (!) 103 100 100  Resp: 18 18 16    Temp: 97.7 F (36.5 C) 98.6 F (37 C) 98.6 F (37 C)   TempSrc: Oral Oral Oral   SpO2: 92% 99% 97% 98%    Wt Readings from Last 3 Encounters:  11/05/16 62.1 kg (137 lb)  10/09/16 67.1 kg (148 lb)  09/29/16 67.1 kg (148 lb)     Intake/Output Summary (Last 24 hours) at 11/15/16 0933 Last data filed at 11/14/16 1700  Gross per 24 hour  Intake              360 ml  Output                0 ml  Net              360 ml     Physical Exam  Awake Alert, Oriented X 3, No new F.N deficits, Normal affect [place].AT,PERRAL Supple Neck,No JVD, No cervical  lymphadenopathy appriciated.  Symmetrical Chest wall movement, Good air movement bilaterally, CTAB, prolonged exp phase RRR,No Gallops,Rubs or new Murmurs, No Parasternal Heave +ve B.Sounds, Abd Soft, No tenderness, No organomegaly appriciated, No rebound - guarding or rigidity. No Cyanosis, Clubbing or edema, No new Rash or bruise   Musck :spine tender to palpation    Data Review:    CBC  Recent Labs Lab 11/13/16 1157 11/15/16 0419  WBC 14.3* 8.3  HGB 12.7 11.3*  HCT 41.2 37.9  PLT 303 297  MCV 92.8 95.2  MCH 28.6 28.4  MCHC 30.8 29.8*  RDW 14.8 15.1  LYMPHSABS 2.2  --   MONOABS 1.4*  --   EOSABS 0.2  --   BASOSABS 0.0  --     Chemistries   Recent Labs Lab 11/13/16 1157 11/15/16 0419  NA 144 141  K 3.2* 4.2  CL 103 99*  CO2 31 34*  GLUCOSE 91 120*  BUN 9 9  CREATININE 0.78 0.75  CALCIUM 8.5* 8.8*  AST  --  19  ALT  --  12*  ALKPHOS  --  96  BILITOT  --  0.7   ------------------------------------------------------------------------------------------------------------------ No results for input(s): CHOL, HDL, LDLCALC, TRIG, CHOLHDL, LDLDIRECT in the last 72 hours.  Lab Results  Component Value Date   HGBA1C 5.7 02/05/2016   ------------------------------------------------------------------------------------------------------------------ No results for input(s): TSH, T4TOTAL, T3FREE, THYROIDAB in the last 72 hours.  Invalid input(s): FREET3 ------------------------------------------------------------------------------------------------------------------ No results for input(s): VITAMINB12, FOLATE,  FERRITIN, TIBC, IRON, RETICCTPCT in the last 72 hours.  Coagulation profile  Recent Labs Lab 11/15/16 0419  INR 0.87    No results for input(s): DDIMER in the last 72 hours.  Cardiac Enzymes No results for input(s): CKMB, TROPONINI, MYOGLOBIN in the last 168 hours.  Invalid input(s):  CK ------------------------------------------------------------------------------------------------------------------ No results found for: BNP  Inpatient Medications  Scheduled Meds: . cholecalciferol  1,000 Units Oral QPM  . furosemide  20 mg Oral Daily  . isosorbide mononitrate  30 mg Oral Daily  . metoprolol tartrate  12.5 mg Oral BID  . nortriptyline  20 mg Oral QHS  . ondansetron  4 mg Oral BID  . pravastatin  20 mg Oral q1800  . predniSONE  15 mg Oral Q breakfast  . tiotropium  18 mcg Inhalation Q0600  . traMADol  100 mg Oral Q12H  . vitamin B-12  1,000 mcg Oral Daily   Continuous Infusions: . methocarbamol (ROBAXIN)  IV     PRN Meds:.acetaminophen **OR** acetaminophen, albuterol, docusate sodium, HYDROmorphone (DILAUDID) injection, methocarbamol (ROBAXIN)  IV, nitroGLYCERIN, polyethylene glycol, polyvinyl alcohol, traMADol  Micro Results No results found for this or any previous visit (from the past 240 hour(s)).  Radiology Reports Mr Thoracic Spine Wo Contrast  Result Date: 11/10/2016 CLINICAL DATA:  Acute thoracic back pain. EXAM: MRI THORACIC SPINE WITHOUT CONTRAST TECHNIQUE: Multiplanar, multisequence MR imaging of the thoracic spine was performed. No intravenous contrast was administered. COMPARISON:  10/05/2016 FINDINGS: The examination is moderately to severely motion degraded throughout. Alignment: Exaggerated thoracic kyphosis. No significant listhesis. Vertebrae: Chronic T5, T10, and T11 compression fractures are again seen status post augmentation. There has been interval augmentation of a T7 compression fracture with mild-to-moderate marrow edema remaining. There is an acute on chronic T6 compression fracture primarily involving the inferior endplate with approximately 70% height loss and moderate marrow edema. There is no evidence of significant retropulsion. Abnormal STIR hyperintensity in the right-sided posterior elements at T9 is unchanged and suggestive of a  hemangioma. Cord: Limited evaluation due to motion artifact. No gross signal abnormality. Paraspinal and other soft tissues: Grossly unremarkable. Disc levels: Detailed assessment limited by motion artifact. Mild thoracic disc degeneration without high-grade spinal stenosis. IMPRESSION: 1. Acute on chronic T6 compression fracture with 70% height loss. 2. Multiple chronic compression fractures. Interval T7 augmentation. Electronically Signed   By: Logan Bores M.D.   On: 11/10/2016 07:57    Time Spent in minutes  30   Reyne Dumas M.D on 11/15/2016 at 9:33 AM  Between 7am to 7pm - Pager -    After 7pm go to [email or web address] - password [facility]  [facility] -  Office  [phone]

## 2016-11-15 NOTE — Progress Notes (Signed)
Unable to perform VP/KP today secondary to pending inpatient insurance authorization as well as scheduling issues.  We are still awaiting to hear if inpatient procedure has been approved by insurance.  If this happens by tomorrow, then we will plan to proceed on 6/26 for VP/KP.  NPO p MN.  Shazia Mitchener E 1:55 PM 11/15/2016

## 2016-11-16 ENCOUNTER — Ambulatory Visit (HOSPITAL_COMMUNITY): Admission: RE | Admit: 2016-11-16 | Payer: Medicare Other | Source: Ambulatory Visit

## 2016-11-16 ENCOUNTER — Encounter (HOSPITAL_COMMUNITY): Payer: Self-pay

## 2016-11-16 ENCOUNTER — Inpatient Hospital Stay (HOSPITAL_COMMUNITY): Payer: Medicare Other

## 2016-11-16 DIAGNOSIS — M4854XA Collapsed vertebra, not elsewhere classified, thoracic region, initial encounter for fracture: Principal | ICD-10-CM

## 2016-11-16 HISTORY — PX: IR VERTEBROPLASTY CERV/THOR BX INC UNI/BIL INC/INJECT/IMAGING: IMG5515

## 2016-11-16 MED ORDER — TOBRAMYCIN SULFATE 1.2 G IJ SOLR
INTRAMUSCULAR | Status: AC
  Administered 2016-11-16: 12:00:00
  Filled 2016-11-16: qty 1.2

## 2016-11-16 MED ORDER — BUPIVACAINE HCL (PF) 0.25 % IJ SOLN
INTRAMUSCULAR | Status: AC
  Administered 2016-11-16: 20 mL
  Filled 2016-11-16: qty 30

## 2016-11-16 MED ORDER — METHOCARBAMOL 500 MG PO TABS: 500.0000 mg | ORAL_TABLET | Freq: Three times a day (TID) | ORAL | 0 refills | Status: DC | PRN

## 2016-11-16 MED ORDER — MIDAZOLAM HCL 2 MG/2ML IJ SOLN
INTRAMUSCULAR | Status: AC | PRN
  Administered 2016-11-16 (×2): 1 mg via INTRAVENOUS

## 2016-11-16 MED ORDER — IOPAMIDOL (ISOVUE-300) INJECTION 61%
INTRAVENOUS | Status: AC
  Administered 2016-11-16: 15 mL
  Filled 2016-11-16: qty 50

## 2016-11-16 MED ORDER — HYDROMORPHONE HCL 1 MG/ML IJ SOLN
INTRAMUSCULAR | Status: AC
  Filled 2016-11-16: qty 1

## 2016-11-16 MED ORDER — GELATIN ABSORBABLE 12-7 MM EX MISC
CUTANEOUS | Status: AC
  Filled 2016-11-16: qty 1

## 2016-11-16 MED ORDER — CLOPIDOGREL BISULFATE 75 MG PO TABS: 75.0000 mg | ORAL_TABLET | Freq: Every day | ORAL | 1 refills | Status: DC

## 2016-11-16 MED ORDER — MIDAZOLAM HCL 2 MG/2ML IJ SOLN
INTRAMUSCULAR | Status: AC
  Filled 2016-11-16: qty 6

## 2016-11-16 MED ORDER — FENTANYL CITRATE (PF) 100 MCG/2ML IJ SOLN
INTRAMUSCULAR | Status: AC | PRN
  Administered 2016-11-16 (×2): 25 ug via INTRAVENOUS

## 2016-11-16 MED ORDER — SODIUM CHLORIDE 0.9 % IV SOLN
INTRAVENOUS | Status: AC
  Administered 2016-11-16: 13:00:00 via INTRAVENOUS

## 2016-11-16 MED ORDER — FENTANYL CITRATE (PF) 100 MCG/2ML IJ SOLN
INTRAMUSCULAR | Status: AC
  Filled 2016-11-16: qty 4

## 2016-11-16 MED ORDER — CEFAZOLIN SODIUM-DEXTROSE 2-4 GM/100ML-% IV SOLN
INTRAVENOUS | Status: AC
  Administered 2016-11-16: 2 g via INTRAVENOUS
  Filled 2016-11-16: qty 100

## 2016-11-16 MED ORDER — LIDOCAINE HCL (PF) 1 % IJ SOLN
INTRAMUSCULAR | Status: AC | PRN
  Administered 2016-11-16: 30 mL

## 2016-11-16 NOTE — Sedation Documentation (Signed)
Patient is resting comfortably. 

## 2016-11-16 NOTE — Consult Note (Signed)
   [facility] CM Inpatient Consult   11/16/2016  Alexis Higgins 04-Aug-1940 [id number]  Notified by [facility] nurse that the Patient is currently active with [facility] for chronic disease management services.  Patient has been engaged by a [employer], CSW, and Software engineer. Our community based plan of care has focused on disease management and community resource support.  Patient will receive a post discharge transition of care call and will be evaluated for monthly home visits for assessments and disease process education.  Made Inpatient Case Manager aware that [facility] following. Of note, [facility] Care Management services does not replace or interfere with any services that are needed or arranged by inpatient case management or social work.  For additional questions or referrals please contact:  Natividad Brood, RN BSN [facility] Liaison  [phone] business mobile phone Toll free office [phone]

## 2016-11-16 NOTE — Evaluation (Signed)
Occupational Therapy Evaluation Patient Details Name: Alexis Higgins MRN: [id number] DOB: Sep 13, 1940 Today's Date: 11/16/2016    History of Present Illness 76 y.o.femalepresenting to ED with back pain on 11/13/16. MRI revealing acute on chronic T6 compression fracture. Kyphoplasty performed 11/16/16. 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    Clinical Impression   PTA, pt was living with her husband who assisted with all ADLs and was performing functional mobility at w/c level. Pt currently requires Max A for LB ADLs and functional mobility. Provided education and handout on back precautions; pt and husband verbalize understanding. Pt demonstrating decreased safety awareness and cognition throughout session; pt perseverating on topics and becoming tearful quickly. Recommend dc home once medically stable per physician. All acute OT needs met and will sign off.      Follow Up Recommendations  DC plan and follow up therapy as arranged by surgeon;Supervision/Assistance - 24 hour    Equipment Recommendations  None recommended by OT    Recommendations for Other Services PT consult     Precautions / Restrictions Precautions Precautions: Back;Fall Precaution Booklet Issued: Yes (comment) Precaution Comments: Provided back precautions handout and reviewed with pt and husband. Both verbalized understanding Restrictions Weight Bearing Restrictions: No      Mobility Bed Mobility Overal bed mobility: Needs Assistance Bed Mobility: Rolling;Sidelying to Sit Rolling: +2 for safety/equipment;Min assist Sidelying to sit: +2 for physical assistance;Mod assist       General bed mobility comments: Educated pt and husband on log rolling. Pt requires Mod A to elevate trunk and manage  BLE  Transfers Overall transfer level: Needs assistance               General transfer comment: Pt and husband perform stand pivot to transport w/c. Pt requires UE support to maintain balance    Balance Overall balance assessment: Needs assistance Sitting-balance support: Bilateral upper extremity supported;Feet supported Sitting balance-Leahy Scale: Poor Sitting balance - Comments: Pt with poor sitting balance during UB dressing. Significant posterior lean. Feel pt has decreased balance when anxiety increases.  Postural control: Posterior lean Standing balance support: During functional activity;Bilateral upper extremity supported Standing balance-Leahy Scale: Poor Standing balance comment: Reliant on UE support on husband to stand                           ADL either performed or assessed with clinical judgement   ADL Overall ADL's : Needs assistance/impaired;At baseline                                       General ADL Comments: Pt near baseline reporting that she has had back surgery prior and her husband performas all her ADLs and functional mobility. Pt is Max A for ADLs.     Vision         Perception     Praxis      Pertinent Vitals/Pain Pain Assessment: Faces Pain Score: 8  Faces Pain Scale: Hurts even more Pain Location: back  Pain Descriptors / Indicators: Aching;Operative site guarding;Sore Pain Intervention(s): Limited activity within patient's tolerance;Monitored during session     Hand Dominance Right   Extremity/Trunk Assessment Upper Extremity Assessment Upper Extremity Assessment: Generalized weakness   Lower  Extremity Assessment Lower Extremity Assessment: Defer to PT evaluation   Cervical / Trunk Assessment Cervical / Trunk Assessment: Other exceptions (s/p kyphoplasty )   Communication Communication Communication: No difficulties   Cognition Arousal/Alertness: Awake/alert Behavior During Therapy:  Restless;Anxious Overall Cognitive Status: Impaired/Different from baseline Area of Impairment: Memory;Following commands;Safety/judgement;Problem solving                     Memory: Decreased short-term memory Following Commands: Follows one step commands inconsistently;Follows one step commands with increased time Safety/Judgement: Decreased awareness of deficits;Decreased awareness of safety   Problem Solving: Slow processing;Difficulty sequencing;Requires verbal cues General Comments: Pt and husband very anxious throughout session. Pt tearful and feeling overwhelmed.  Pt would perseverate on askign the RN to remove her IV and backing up items in the room in preparation to dc. Husband would continually remind pt that he has packed their personal belongings and the RN is coming.   General Comments  husband present throughout session     Exercises     Shoulder Instructions      Home Living Family/patient expects to be discharged to:: Private residence Living Arrangements: Spouse/significant other Available Help at Discharge: Family;Available 24 hours/day Type of Home: House Home Access: Stairs to enter [employer] of Steps: 1   Home Layout: One level     Bathroom Shower/Tub: Teacher, early years/pre: Handicapped height         Additional Comments: Husband assists with WC management up the step       Prior Functioning/Environment Level of Independence: Needs assistance  Gait / Transfers Assistance Needed: w/c and husband assists with all functional mobilituy ADL's / Homemaking Assistance Needed: Husband assist with all ADLs            OT Problem List: Decreased strength;Decreased range of motion;Decreased activity tolerance;Impaired balance (sitting and/or standing);Decreased cognition;Decreased safety awareness;Decreased knowledge of use of DME or AE;Decreased knowledge of precautions;Pain      OT Treatment/Interventions:      OT  Goals(Current goals can be found in the care plan section) Acute Rehab OT Goals Patient Stated Goal: Go home today OT Goal Formulation: With patient Time For Goal Achievement: 11/30/16 Potential to Achieve Goals: Good  OT Frequency:     Barriers to D/C:            Co-evaluation              AM-PAC PT "6 Clicks" Daily Activity     Outcome Measure Help from another person eating meals?: A Little Help from another person taking care of personal grooming?: A Little Help from another person toileting, which includes using toliet, bedpan, or urinal?: A Lot Help from another person bathing (including washing, rinsing, drying)?: A Lot Help from another person to put on and taking off regular upper body clothing?: A Lot Help from another person to put on and taking off regular lower body clothing?: A Lot 6 Click Score: 14   End of Session Equipment Utilized During Treatment: Oxygen Nurse Communication: Mobility status  Activity Tolerance: Patient limited by pain (Limited by anxiety) Patient left: with call bell/phone within reach;with family/visitor present (in transport w/c)  OT Visit Diagnosis: Unsteadiness on feet (R26.81);Other abnormalities of gait and mobility (R26.89);Muscle weakness (generalized) (M62.81);History of falling (Z91.81);Pain;Other symptoms and signs involving cognitive function Pain - Right/Left:  (Back) Pain - part of body:  (Back)                Time:  9144-4584 OT Time Calculation (min): 13 min Charges:  OT General Charges $OT Visit: 1 Procedure OT Evaluation $OT Eval Low Complexity: 1 Procedure G-Codes:     Annalina Needles MSOT, OTR/L Acute Rehab Pager: [phone] Office: [place] 11/16/2016, 4:20 PM

## 2016-11-16 NOTE — Sedation Documentation (Signed)
dsg intact to upper back

## 2016-11-16 NOTE — Progress Notes (Signed)
Physical and occupational therapy saw and worked with patient. Discharge instructions reviewed with patient and husband. Script noted to be at [facility] on [address]. Pt wheeled out via chair and home O2.

## 2016-11-16 NOTE — Discharge Summary (Signed)
Physician Discharge Summary  Alexis Higgins MRN: [id number] DOB/AGE: [birth year] 76 y.o.  PCP: Biagio Borg, MD   Admit date: 11/13/2016 Discharge date: 11/16/2016  Discharge Diagnoses:    Principal Problem:   Intractable back pain Active Problems:   Depression   Essential hypertension   CAD (coronary artery disease)   Chronic respiratory failure (HCC)   Dysphagia   Closed compression fracture of thoracic vertebra (HCC)   Thoracic compression fracture (HCC)   Back pain    Follow-up recommendations Follow-up with PCP in 3-5 days , including all  additional recommended appointments as below Follow-up CBC, CMP in 3-5 days Patient  discharged with HHPT after kyphoplasty      Current Discharge Medication List    START taking these medications   Details  methocarbamol (ROBAXIN) 500 MG tablet Take 1 tablet (500 mg total) by mouth every 8 (eight) hours as needed for muscle spasms. Qty: 30 tablet, Refills: 0      CONTINUE these medications which have CHANGED   Details  clopidogrel (PLAVIX) 75 MG tablet Take 1 tablet (75 mg total) by mouth daily. TAKE 1 TABLET (75 MG TOTAL) BY MOUTH DAILY. Qty: 30 tablet, Refills: 1      CONTINUE these medications which have NOT CHANGED   Details  acetaminophen (TYLENOL) 500 MG tablet Take 1,000 mg by mouth every 6 (six) hours as needed for moderate pain or headache.    ascorbic acid (VITAMIN C) 1000 MG tablet Take 1,000 mg by mouth daily.    Cholecalciferol (VITAMIN D) 1000 UNITS capsule Take 1,000 Units by mouth every evening.     diclofenac sodium (VOLTAREN) 1 % GEL APPLY 4 GRAMS TOPICALLY 4 TIMES DAILY Qty: 100 g, Refills: 0    docusate sodium (COLACE) 100 MG capsule Take 100 mg by mouth daily as needed for mild constipation.     furosemide (LASIX) 20 MG tablet Take 1 tablet (20 mg total) by mouth daily. Qty: 90 tablet, Refills: 1    isosorbide mononitrate (IMDUR) 30 MG 24 hr tablet TAKE 0.5 TABLETS (15 MG TOTAL) BY MOUTH  DAILY. Qty: 45 tablet, Refills: 3    lovastatin (MEVACOR) 20 MG tablet TAKE 1 TABLET BY MOUTH AT BEDTIME Qty: 90 tablet, Refills: 3    metoprolol tartrate (LOPRESSOR) 25 MG tablet TAKE 12.5 MG BY MOUTH 2 TIMES A DAY Refills: 11    nortriptyline (PAMELOR) 10 MG capsule Take 20 mg by mouth at bedtime.    ondansetron (ZOFRAN) 4 MG tablet Take 1 tablet (4 mg total) by mouth every 8 (eight) hours as needed for nausea or vomiting. Qty: 11 tablet, Refills: 0    polyethylene glycol (MIRALAX / GLYCOLAX) packet Take 17 g by mouth daily as needed for mild constipation.    predniSONE (DELTASONE) 10 MG tablet Take 15 mg by mouth daily with breakfast.    PROAIR HFA 108 (90 Base) MCG/ACT inhaler INHALE 2 PUFFS INTO THE LUNGS EVERY 6 (SIX) HOURS AS NEEDED FOR WHEEZING OR SHORTNESS OF BREATH. Qty: 25.5 Inhaler, Refills: 2    tiotropium (SPIRIVA HANDIHALER) 18 MCG inhalation capsule Place 1 capsule (18 mcg total) into inhaler and inhale daily at 6 (six) AM. Qty: 90 capsule, Refills: 3    traMADol (ULTRAM) 50 MG tablet Take 1 tablet (50 mg total) by mouth every 8 (eight) hours as needed. Qty: 60 tablet, Refills: 0    vitamin B-12 (CYANOCOBALAMIN) 1000 MCG tablet Take 1,000 mcg by mouth daily.    Lidocaine HCl (PAIN RELIEF  ROLL-ON EX) Apply 1 application topically 2 (two) times daily as needed (PAIN).     nitroGLYCERIN (NITROSTAT) 0.4 MG SL tablet Place 1 tablet (0.4 mg total) under the tongue every 5 (five) minutes as needed for chest pain (up to 3 doses). Qty: 25 tablet, Refills: 4    polyvinyl alcohol (LIQUIFILM TEARS) 1.4 % ophthalmic solution Place 1 drop into both eyes daily as needed for dry eyes.          Discharge Condition:    Discharge Instructions Get Medicines reviewed and adjusted: Please take all your medications with you for your next visit with your Primary MD  Please request your Primary MD to go over all hospital tests and procedure/radiological results at the follow up,  please ask your Primary MD to get all Hospital records sent to his/her office.  If you experience worsening of your admission symptoms, develop shortness of breath, life threatening emergency, suicidal or homicidal thoughts you must seek medical attention immediately by calling 911 or calling your MD immediately if symptoms less severe.  You must read complete instructions/literature along with all the possible adverse reactions/side effects for all the Medicines you take and that have been prescribed to you. Take any new Medicines after you have completely understood and accpet all the possible adverse reactions/side effects.   Do not drive when taking Pain medications.   Do not take more than prescribed Pain, Sleep and Anxiety Medications  Special Instructions: If you have smoked or chewed Tobacco in the last 2 yrs please stop smoking, stop any regular Alcohol and or any Recreational drug use.  Wear Seat belts while driving.  Please note  You were cared for by a hospitalist during your hospital stay. Once you are discharged, your primary care physician will handle any further medical issues. Please note that NO REFILLS for any discharge medications will be authorized once you are discharged, as it is imperative that you return to your primary care physician (or establish a relationship with a primary care physician if you do not have one) for your aftercare needs so that they can reassess your need for medications and monitor your lab values.  Discharge Instructions    Diet - low sodium heart healthy    Complete by:  As directed    Increase activity slowly    Complete by:  As directed        Allergies  Allergen Reactions  . Aspirin Other (See Comments)     cns bleed risk  . Codeine Anaphylaxis and Rash  . Other Other (See Comments)    Stiolto - severe reaction - caused inability to breath      Disposition: 06-Home-Health Care Svc   Consults:  IR    Significant  Diagnostic Studies:  Mr Thoracic Spine Wo Contrast  Result Date: 11/10/2016 CLINICAL DATA:  Acute thoracic back pain. EXAM: MRI THORACIC SPINE WITHOUT CONTRAST TECHNIQUE: Multiplanar, multisequence MR imaging of the thoracic spine was performed. No intravenous contrast was administered. COMPARISON:  10/05/2016 FINDINGS: The examination is moderately to severely motion degraded throughout. Alignment: Exaggerated thoracic kyphosis. No significant listhesis. Vertebrae: Chronic T5, T10, and T11 compression fractures are again seen status post augmentation. There has been interval augmentation of a T7 compression fracture with mild-to-moderate marrow edema remaining. There is an acute on chronic T6 compression fracture primarily involving the inferior endplate with approximately 70% height loss and moderate marrow edema. There is no evidence of significant retropulsion. Abnormal STIR hyperintensity in the right-sided posterior elements  at T9 is unchanged and suggestive of a hemangioma. Cord: Limited evaluation due to motion artifact. No gross signal abnormality. Paraspinal and other soft tissues: Grossly unremarkable. Disc levels: Detailed assessment limited by motion artifact. Mild thoracic disc degeneration without high-grade spinal stenosis. IMPRESSION: 1. Acute on chronic T6 compression fracture with 70% height loss. 2. Multiple chronic compression fractures. Interval T7 augmentation. Electronically Signed   By: Logan Bores M.D.   On: 11/10/2016 07:57       Blood Culture    Component Value Date/Time   SDES URINE, CLEAN CATCH 11/22/2013 2000   SPECREQUEST NONE 11/22/2013 2000   CULT NO GROWTH Performed at [facility] 11/22/2013 2000   REPTSTATUS 11/24/2013 FINAL 11/22/2013 2000      Labs: Results for orders placed or performed during the hospital encounter of 11/13/16 (from the past 48 hour(s))  CBC     Status: Abnormal   Collection Time: 11/15/16  4:19 AM  Result Value Ref Range    WBC 8.3 4.0 - 10.5 K/uL   RBC 3.98 3.87 - 5.11 MIL/uL   Hemoglobin 11.3 (L) 12.0 - 15.0 g/dL   HCT 37.9 36.0 - 46.0 %   MCV 95.2 78.0 - 100.0 fL   MCH 28.4 26.0 - 34.0 pg   MCHC 29.8 (L) 30.0 - 36.0 g/dL   RDW 15.1 11.5 - 15.5 %   Platelets 297 150 - 400 K/uL  Comprehensive metabolic panel     Status: Abnormal   Collection Time: 11/15/16  4:19 AM  Result Value Ref Range   Sodium 141 135 - 145 mmol/L   Potassium 4.2 3.5 - 5.1 mmol/L    Comment: DELTA CHECK NOTED   Chloride 99 (L) 101 - 111 mmol/L   CO2 34 (H) 22 - 32 mmol/L   Glucose, Bld 120 (H) 65 - 99 mg/dL   BUN 9 6 - 20 mg/dL   Creatinine, Ser 0.75 0.44 - 1.00 mg/dL   Calcium 8.8 (L) 8.9 - 10.3 mg/dL   Total Protein 5.0 (L) 6.5 - 8.1 g/dL   Albumin 2.7 (L) 3.5 - 5.0 g/dL   AST 19 15 - 41 U/L   ALT 12 (L) 14 - 54 U/L   Alkaline Phosphatase 96 38 - 126 U/L   Total Bilirubin 0.7 0.3 - 1.2 mg/dL   GFR calc non Af Amer >60 >60 mL/min   GFR calc Af Amer >60 >60 mL/min    Comment: (NOTE) The eGFR has been calculated using the CKD EPI equation. This calculation has not been validated in all clinical situations. eGFR's persistently <60 mL/min signify possible Chronic Kidney Disease.    Anion gap 8 5 - 15  Protime-INR     Status: None   Collection Time: 11/15/16  4:19 AM  Result Value Ref Range   Prothrombin Time 11.8 11.4 - 15.2 seconds   INR 0.87   APTT     Status: None   Collection Time: 11/15/16  4:19 AM  Result Value Ref Range   aPTT 28 24 - 36 seconds     Lipid Panel     Component Value Date/Time   CHOL 207 (H) 02/05/2016 0856   TRIG 276.0 (H) 02/05/2016 0856   HDL 112.20 02/05/2016 0856   CHOLHDL 2 02/05/2016 0856   VLDL 55.2 (H) 02/05/2016 0856   LDLCALC 57 02/01/2014 1418   LDLDIRECT 49.0 02/05/2016 0856     Lab Results  Component Value Date   HGBA1C 5.7 02/05/2016   HGBA1C 5.6  08/01/2015   HGBA1C 5.3 07/18/2012     Lab Results  Component Value Date   LDLCALC 57 02/01/2014   CREATININE 0.75  11/15/2016     HPI     76 y.o.femalewith medical history significant for hypertension, hyperlipidemia, 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 Patient had an MRI 3 days ago revealing acute on chronic T6 compression fracture. Kyphoplasty scheduled for June 26. Patient being admitted for pain control  HOSPITAL COURSE:   Intractable back pain  Due to acute on chronic compression fracture at T6 per MRI done 6/20 History of T7 T5 T10 compression fractures. She increased her tramadol at home which was not managing her pain. S/p Kyphoplasty   June 26. tramadol 155m, iv dilaudid prn pain Robaxin prn Kyphoplasty scheduled for June 26 as outpatient, IR  performed procedure 6/26  as inpatient after receiving insurance authroisation Resume plavix tomorrow PT eval recommended HHPT  Osteoporosis Please consider FORTEO as outpatient  Chronic respiratory failure with O2 dependent COPD. On steroids for 2 years. Appears stable at baseline. Continue prednisone 15 mg Continue on o2 Incentive spirometry  Hypokalemia Repleted,4.2   CAD. Status post stent. Patient denies chest pain. Home medications include metoprolol imdur and pravastatin continue home meds  Hypertension.  Stable, on Lasix, Imdur, metoprolol    Leukocytosis. WBCs 14.3. Likely related prednisone. Chest afebrile hemodynamically stable and nontoxic appearing ua negative   Pulmonary nodule 355m Can have f/u as outpatient with PCP   Discharge Exam:  Blood pressure (!) 148/88, pulse 91, temperature 97.9 F (36.6 C), temperature source Oral, resp. rate 15, SpO2 98 %.  Awake Alert, Oriented X 3, No new F.N deficits, Normal affect [place].AT,PERRAL Supple Neck,No JVD, No cervical lymphadenopathy  appriciated.  Symmetrical Chest wall movement, Good air movement bilaterally, CTAB, prolonged exp phase RRR,No Gallops,Rubs or new Murmurs, No Parasternal Heave +ve B.Sounds, Abd Soft, No tenderness, No organomegaly appriciated, No rebound - guarding or rigidity. No Cyanosis, Clubbing or edema, No new Rash or bruise   Musck :spine tender to palpation      Signed: NaReyne Dumas/26/2018, 3:20 PM        Time spent >1 hour

## 2016-11-16 NOTE — Progress Notes (Addendum)
Patient ID: Alexis Higgins, female   DOB: [birth year], 76 y.o.   MRN: [id number]                                                                PROGRESS NOTE                                                                                                                                                                                                             Patient Demographics:    Alexis Higgins, is a 76 y.o. female, DOB - May 16, 1941, MOQ:[id number]  Admit date - 11/13/2016   Admitting Physician Elwin Mocha, MD  Outpatient Primary MD for the patient is Biagio Borg, MD  LOS - 2  Outpatient Specialists:   Luanne Bras (IR), [place]rum (pulmonary)  Chief Complaint  Patient presents with  . Back Pain       Brief Narrative     76 y.o. 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 Patient had an MRI 3 days ago revealing acute on chronic T6 compression fracture. Kyphoplasty scheduled for June 26. Patient being admitted for pain control      Subjective:    Dejae Bernet husband is by the bedside, patient anxious about delay in procedure     Assessment  & Plan :    Principal Problem:   Intractable back pain Active Problems:   Depression   Essential hypertension   CAD (coronary artery disease)   Chronic respiratory failure (HCC)   Dysphagia   Closed compression fracture of thoracic vertebra (HCC)   Thoracic compression fracture (HCC)   Back pain   Intractable back pain  Due to acute on chronic compression fracture at T6 per MRI done 6/20 History of T7 T5 T10 compression fractures. She increased her tramadol at home which was not managing her pain. S/p Kyphoplasty   June  26. tramadol 100mg , iv dilaudid prn pain Robaxin prn Kyphoplasty scheduled for June 26 as outpatient, IR will attempt to perform procedure 6/25 as inpatient Resume plavix in am  PT eval  Osteoporosis Please consider FORTEO  as outpatient  Chronic respiratory failure with O2 dependent COPD. On steroids for 2 years. Appears stable at baseline. Continue prednisone 15 mg Continue on o2 Incentive spirometry  Hypokalemia Repleted,4.2   CAD. Status post stent. Patient denies chest pain. Home medications include metoprolol imdur and pravastatin continue home meds  Hypertension.  Stable, on Lasix, Imdur, metoprolol Hydralazine prn   Leukocytosis. WBCs 14.3. Likely related prednisone. Chest afebrile hemodynamically stable and nontoxic appearing ua negative   Pulmonary nodule 16mm  Can have f/u as outpatient   DVT prophylaxis: scd Code Status: full  Family Communication: husband at bedside  Disposition Plan: home when pain controlled , s/p vertroplasty/kyphoplasty Consults called: none  Admission status: obs       Lab Results  Component Value Date   PLT 297 11/15/2016    Antibiotics  :    Anti-infectives    Start     Dose/Rate Route Frequency Ordered Stop   11/16/16 1109  tobramycin (NEBCIN) 1.2 g powder    Comments:  Falls, Heather   : cabinet override      11/16/16 1109 11/16/16 1204   11/16/16 0800  ceFAZolin (ANCEF) IVPB 2g/100 mL premix     2 g 200 mL/hr over 30 Minutes Intravenous On call 11/15/16 1555 11/16/16 1157        Objective:   Vitals:   11/16/16 1154 11/16/16 1157 11/16/16 1201 11/16/16 1221  BP: 140/85 (!) 142/85 (!) 148/88   Pulse: 90 89 91   Resp: 12 10 15    Temp:      TempSrc:      SpO2: 100% 100% 100% 98%    Wt Readings from Last 3 Encounters:  11/05/16 62.1 kg (137 lb)  10/09/16 67.1 kg (148 lb)  09/29/16 67.1 kg (148 lb)     Intake/Output Summary (Last 24 hours) at 11/16/16 1307 Last data filed at 11/16/16 0900  Gross  per 24 hour  Intake              240 ml  Output                0 ml  Net              240 ml     Physical Exam  Awake Alert, Oriented X 3, No new F.N deficits, Normal affect [place].AT,PERRAL Supple Neck,No JVD, No cervical lymphadenopathy appriciated.  Symmetrical Chest wall movement, Good air movement bilaterally, CTAB, prolonged exp phase RRR,No Gallops,Rubs or new Murmurs, No Parasternal Heave +ve B.Sounds, Abd Soft, No tenderness, No organomegaly appriciated, No rebound - guarding or rigidity. No Cyanosis, Clubbing or edema, No new Rash or bruise   Musck :spine tender to palpation    Data Review:    CBC  Recent Labs Lab 11/13/16 1157 11/15/16 0419  WBC 14.3* 8.3  HGB 12.7 11.3*  HCT 41.2 37.9  PLT 303 297  MCV 92.8 95.2  MCH 28.6 28.4  MCHC 30.8 29.8*  RDW 14.8 15.1  LYMPHSABS 2.2  --   MONOABS 1.4*  --   EOSABS 0.2  --   BASOSABS 0.0  --     Chemistries   Recent Labs Lab 11/13/16 1157 11/15/16 0419  NA 144 141  K 3.2* 4.2  CL 103 99*  CO2 31 34*  GLUCOSE 91 120*  BUN 9 9  CREATININE 0.78 0.75  CALCIUM 8.5* 8.8*  AST  --  19  ALT  --  12*  ALKPHOS  --  96  BILITOT  --  0.7   ------------------------------------------------------------------------------------------------------------------ No results for input(s): CHOL, HDL, LDLCALC, TRIG, CHOLHDL, LDLDIRECT in the last 72 hours.  Lab Results  Component Value Date   HGBA1C 5.7 02/05/2016   ------------------------------------------------------------------------------------------------------------------ No results for input(s): TSH, T4TOTAL, T3FREE, THYROIDAB in the last 72 hours.  Invalid input(s): FREET3 ------------------------------------------------------------------------------------------------------------------ No results for input(s): VITAMINB12, FOLATE, FERRITIN, TIBC, IRON, RETICCTPCT in the last 72 hours.  Coagulation profile  Recent Labs Lab 11/15/16 0419  INR 0.87    No  results for input(s): DDIMER in the last 72 hours.  Cardiac Enzymes No results for input(s): CKMB, TROPONINI, MYOGLOBIN in the last 168 hours.  Invalid input(s): CK ------------------------------------------------------------------------------------------------------------------ No results found for: BNP  Inpatient Medications  Scheduled Meds: . cholecalciferol  1,000 Units Oral QPM  . fentaNYL      . furosemide  20 mg Oral Daily  . gelatin adsorbable      . isosorbide mononitrate  30 mg Oral Daily  . metoprolol tartrate  12.5 mg Oral BID  . midazolam      . nortriptyline  20 mg Oral QHS  . ondansetron  4 mg Oral BID  . pravastatin  20 mg Oral q1800  . predniSONE  15 mg Oral Q breakfast  . tiotropium  18 mcg Inhalation Q0600  . traMADol  100 mg Oral Q12H  . vitamin B-12  1,000 mcg Oral Daily   Continuous Infusions: . sodium chloride 50 mL/hr at 11/16/16 1305  . methocarbamol (ROBAXIN)  IV     PRN Meds:.acetaminophen **OR** acetaminophen, albuterol, docusate sodium, hydrALAZINE, HYDROmorphone (DILAUDID) injection, methocarbamol (ROBAXIN)  IV, nitroGLYCERIN, polyethylene glycol, polyvinyl alcohol, traMADol  Micro Results No results found for this or any previous visit (from the past 240 hour(s)).  Radiology Reports Mr Thoracic Spine Wo Contrast  Result Date: 11/10/2016 CLINICAL DATA:  Acute thoracic back pain. EXAM: MRI THORACIC SPINE WITHOUT CONTRAST TECHNIQUE: Multiplanar, multisequence MR imaging of the thoracic spine was performed. No intravenous contrast was administered. COMPARISON:  10/05/2016 FINDINGS: The examination is moderately to severely motion degraded throughout. Alignment: Exaggerated thoracic kyphosis. No significant listhesis. Vertebrae: Chronic T5, T10, and T11 compression fractures are again seen status post augmentation. There has been interval augmentation of a T7 compression fracture with mild-to-moderate marrow edema remaining. There is an acute on  chronic T6 compression fracture primarily involving the inferior endplate with approximately 70% height loss and moderate marrow edema. There is no evidence of significant retropulsion. Abnormal STIR hyperintensity in the right-sided posterior elements at T9 is unchanged and suggestive of a hemangioma. Cord: Limited evaluation due to motion artifact. No gross signal abnormality. Paraspinal and other soft tissues: Grossly unremarkable. Disc levels: Detailed assessment limited by motion artifact. Mild thoracic disc degeneration without high-grade spinal stenosis. IMPRESSION: 1. Acute on chronic T6 compression fracture with 70% height loss. 2. Multiple chronic compression fractures. Interval T7 augmentation. Electronically Signed   By: Logan Bores M.D.   On: 11/10/2016 07:57    Time Spent in minutes  30   Reyne Dumas M.D on 11/16/2016 at 1:07 PM  Between 7am to 7pm - Pager -    After 7pm go to [email or web address] - password [email or web address]  [facility] -  Office  [phone]

## 2016-11-16 NOTE — Evaluation (Signed)
Physical Therapy Evaluation Patient Details Name: Alexis Higgins MRN: [id number] DOB: Dec 19, 1940 Today's Date: 11/16/2016   History of Present Illness  76 y.o.femalepresenting to ED with back pain on 11/13/16. MRI revealing acute on chronic T6 compression fracture. Kyphoplasty performed 11/16/16. 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   Clinical Impression  Pt s/p surgery above with deficits below. PTA, pt required assist for transfers and used [facility] for primary mobility. Upon evaluation, pt with increased anxiety, and husband irritated at therapy presence. Required multiple bouts of education explaining role of PT. Pt requiring min to mod A for bed mobility and cues for maintaining precautions during bed mobility. Pt with increased anxiety upon sitting, and requested husband to assist her to the chair. Pt required assist from husband to perform stand pivot transfer. Pt reports she was receiving HHPT prior to admission and recommending continuation of HHPT. Pt will have assist from husband at home. No further skilled PT needs at this time. Will sign off. If needs change, please re-consult.     Follow Up Recommendations Home health PT;Supervision/Assistance - 24 hour    Equipment Recommendations  None recommended by PT    Recommendations for Other Services       Precautions / Restrictions Precautions Precautions: Back;Fall Precaution Booklet Issued: Yes (comment) Precaution Comments: Provided back precautions handout and reviewed with pt and husband. Both verbalized understanding Restrictions Weight Bearing Restrictions: No      Mobility  Bed Mobility Overal bed mobility: Needs Assistance Bed Mobility: Rolling;Sidelying to Sit Rolling: +2 for safety/equipment;Min  assist Sidelying to sit: +2 for physical assistance;Mod assist       General bed mobility comments: Educated pt and husband on log rolling. Pt requires Mod A to elevate trunk and manage BLE  Transfers Overall transfer level: Needs assistance               General transfer comment: Pt and husband perform stand pivot to transport w/c. Pt requires UE support to maintain balance  Ambulation/Gait             General Gait Details: Uses WC at baseline   Stairs            Wheelchair Mobility    Modified Rankin (Stroke Patients Only)       Balance Overall balance assessment: Needs assistance Sitting-balance support: Bilateral upper extremity supported;Feet supported Sitting balance-Leahy Scale: Poor Sitting balance - Comments: Pt with poor sitting balance during UB dressing. Significant posterior lean. Feel pt has decreased balance when anxiety increases.  Postural control: Posterior lean Standing balance support: During functional activity;Bilateral upper extremity supported Standing balance-Leahy Scale: Poor Standing balance comment: Reliant on UE support on husband to stand                             Pertinent Vitals/Pain Pain Assessment: Faces Pain Score: 8  Faces Pain Scale: Hurts even more Pain Location: back  Pain Descriptors / Indicators: Aching;Operative site guarding;Sore Pain Intervention(s): Limited activity within patient's tolerance;Monitored during session    [place] expects to be discharged to:: Private residence Living Arrangements: Spouse/significant other Available Help at Discharge: Family;Available 24 hours/day Type of Home: House Home Access: Stairs to enter   [employer] of Steps: 1 Home Layout: One level   Additional Comments:  Husband assists with WC management up the step     Prior Function Level of Independence: Needs assistance   Gait / Transfers Assistance Needed: w/c and husband  assists with all functional mobilituy  ADL's / Homemaking Assistance Needed: Husband assist with all ADLs        Hand Dominance   Dominant Hand: Right    Extremity/Trunk Assessment   Upper Extremity Assessment Upper Extremity Assessment: Generalized weakness    Lower Extremity Assessment Lower Extremity Assessment: Defer to PT evaluation    Cervical / Trunk Assessment Cervical / Trunk Assessment: Other exceptions (s/p kyphoplasty ) Cervical / Trunk Exceptions: s/p T6 kyphoplasty   Communication   Communication: No difficulties  Cognition Arousal/Alertness: Awake/alert Behavior During Therapy: Restless;Anxious Overall Cognitive Status: Impaired/Different from baseline Area of Impairment: Memory;Following commands;Safety/judgement;Problem solving                     Memory: Decreased short-term memory Following Commands: Follows one step commands inconsistently;Follows one step commands with increased time Safety/Judgement: Decreased awareness of deficits;Decreased awareness of safety   Problem Solving: Slow processing;Difficulty sequencing;Requires verbal cues General Comments: Pt and husband very anxious throughout session. Pt tearful and feeling overwhelmed.  Pt would perseverate on askign the RN to remove her IV and backing up items in the room in preparation to dc. Husband would continually remind pt that he has packed their personal belongings and the RN is coming.      General Comments General comments (skin integrity, edema, etc.): husband present throughout session    Exercises     Assessment/Plan    PT Assessment Patent does not need any further PT services  PT Problem List         PT Treatment Interventions      PT Goals (Current goals can be found in the Care Plan section)  Acute Rehab PT Goals Patient Stated Goal: Go home today PT Goal Formulation: With patient Time For Goal Achievement: 11/16/16 Potential to Achieve Goals: Fair     Frequency     Barriers to discharge        Co-evaluation PT/OT/SLP Co-Evaluation/Treatment: Yes Reason for Co-Treatment: For patient/therapist safety;To address functional/ADL transfers PT goals addressed during session: Mobility/safety with mobility;Proper use of DME         AM-PAC PT "6 Clicks" Daily Activity  Outcome Measure Difficulty turning over in bed (including adjusting bedclothes, sheets and blankets)?: Total Difficulty moving from lying on back to sitting on the side of the bed? : Total Difficulty sitting down on and standing up from a chair with arms (e.g., wheelchair, bedside commode, etc,.)?: Total Help needed moving to and from a bed to chair (including a wheelchair)?: A Lot Help needed walking in hospital room?: A Lot Help needed climbing 3-5 steps with a railing? : Total 6 Click Score: 8    End of Session   Activity Tolerance: Other (comment) (Limited by anxiety ) Patient left: in chair;with family/visitor present;with call bell/phone within reach Nurse Communication: Mobility status;Other (comment) (pt requesting to get IV out ) PT Visit Diagnosis: Muscle weakness (generalized) (M62.81);Unsteadiness on feet (R26.81);Pain Pain - part of body:  (back )    Time: 2542-7062 PT Time Calculation (min) (ACUTE ONLY): 19 min   Charges:   PT Evaluation $PT Eval Moderate Complexity: 1 Procedure     PT G Codes:        Leighton Ruff, PT, DPT  Acute Rehabilitation Services  Pager: [phone]   Rudean Hitt 11/16/2016,  4:45 PM

## 2016-11-16 NOTE — Procedures (Signed)
S/P T6 VP 

## 2016-11-16 NOTE — Discharge Instructions (Signed)
1.No stooping,bensing or lifting motrb than 10 lbs for 2 weeks. 2.Use walker to ambulate for 2 weeks.Marland Kitchen 3.RTC PRN 2 weeks.

## 2016-11-17 DIAGNOSIS — J449 Chronic obstructive pulmonary disease, unspecified: Secondary | ICD-10-CM | POA: Diagnosis not present

## 2016-11-18 ENCOUNTER — Encounter (HOSPITAL_COMMUNITY): Payer: Self-pay | Admitting: Interventional Radiology

## 2016-11-18 ENCOUNTER — Telehealth: Payer: Self-pay | Admitting: *Deleted

## 2016-11-18 NOTE — Telephone Encounter (Signed)
Tried calling pt to set up TCM hosp f/u appt no answer LMOM RTC.../lmb 

## 2016-11-20 ENCOUNTER — Other Ambulatory Visit: Payer: Self-pay

## 2016-11-20 NOTE — Patient Outreach (Signed)
[place] [facility]) Care Management  11/20/16  Alexis Higgins 1940/10/28  [id number]  Patient was admitted to hospital on 6/23-6/26/2018 due to intractable back pain related to acute on chronic compression fracture at T6 per MRI done 6/20. Kyphoplasty done on 11/16/2016.  Successful outreach completed with patient and her husband. Patient identification verified.  Patient stated that she is doing a little better. She stated that prior to this admission, her pain was radiating to the front in her chest area. She reported that the surgery removes the radiating pain, but she does continue to have pain in her back where the compression fracture was located.   Patient stated that the extra dose of tramadol did end up affecting her breathing and she had to go back to taking it only twice a day. Her husband reported that she was given a muscle relaxer (robaxin) when discharged from hospital. She stated that she started it the day before yesterday (supposed to take 4 tablets in 24 hours). She stated that she took one the night before yesterday and one yesterday morning and did not take anymore. She stated that it was causing her chest was to feel like it was tightening up. Her husband stated that he feels that the combination of the tramadol and the muscle relaxer was causing her to not to be able to breathe. She stated that it was not helping her and she will not be taking it anymore.  Patient currently has no other concerns at present. She is just glad that she was able to get in to have the MRI done sooner so she did not have to wait so long to have surgery.   Plan: RNCM to continue to follow patient for transition of care and patient was agreeable.  Alexis R. Mcclellan Demarais, RN, BSN, [place] Management Coordinator [phone]

## 2016-11-23 ENCOUNTER — Ambulatory Visit: Payer: Self-pay

## 2016-11-26 ENCOUNTER — Telehealth (HOSPITAL_COMMUNITY): Payer: Self-pay

## 2016-11-26 NOTE — Telephone Encounter (Signed)
Pt is feeling better and doesn't want to come in for f/u at this time. She will call if things change and she starts to have back pain. AW

## 2016-11-29 ENCOUNTER — Telehealth: Payer: Self-pay | Admitting: Internal Medicine

## 2016-11-29 ENCOUNTER — Other Ambulatory Visit: Payer: Self-pay | Admitting: Emergency Medicine

## 2016-11-29 ENCOUNTER — Telehealth: Payer: Self-pay | Admitting: Cardiovascular Disease

## 2016-11-29 MED ORDER — ISOSORBIDE MONONITRATE ER 30 MG PO TB24: ORAL_TABLET | ORAL | 2 refills | Status: DC

## 2016-11-29 MED ORDER — CLOPIDOGREL BISULFATE 75 MG PO TABS: 75.0000 mg | ORAL_TABLET | Freq: Every day | ORAL | 2 refills | Status: DC

## 2016-11-29 NOTE — Telephone Encounter (Signed)
Patient is offered an appt for today with Dr. Carlean Purl, but she is not able to get here.  She will come in with Ellouise Newer, [place] on 12/02/16.  Appt is scheduled with her husband.  She has had multiple back surgeries and broken ribs in the last 5 months. She will increase her miralax from PRN to 2-3 times a day.

## 2016-11-29 NOTE — Telephone Encounter (Signed)
Pt's husband concerned about getting medications refilled since pt cannot see Dr Angelena Form until September 2018. Pt's husband advised we can refill cardiac medications until she is able to see Dr Angelena Form in September 2018.

## 2016-11-29 NOTE — Telephone Encounter (Signed)
New message    Pt husband is calling asking for a call back about pt medications.

## 2016-11-30 ENCOUNTER — Ambulatory Visit (HOSPITAL_COMMUNITY): Payer: Medicare Other

## 2016-11-30 ENCOUNTER — Other Ambulatory Visit: Payer: Self-pay

## 2016-11-30 NOTE — Patient Outreach (Signed)
[place] [facility]) Care Management  11/30/16  Alexis Higgins [birth year] [id number]  Successful outreach completed with patient and her husband, Alexis Higgins. Patient identification verified.  Patient stated that she has been doing better. She stated that she is not hurting in her back and is doing fairly well. Her husband reported that she has been sleeping through the whole night now. He also stated that he was able to get her out of the house earlier this week to get some sunshine. He took her outside on Monday and they walked a little down the street and around their neighborhood. He stated she was doing pretty good except for some troubles with her stomach.   He reported that for the last week and a half, she has been having difficulty with constipation. She has increased her intake of miralax and it finally started to break up.   Her husband stated that they have made follow up appointments with all of her doctors except Dr. Jenny Reichmann. She will see the GI doctor on Thursday,  the pulmonary doctor on Friday, and in August, she will go back to see her pain management doctor. She has an appointment in September for a physical with Dr. Jenny Reichmann and also an appointment with her heart doctor.  Patient currently has no other questions or concerns. She currently denies any needs.  Plan: RNCM to continue to follow for transition of care and patient and husband were agreeable.  [place] R. Trason Shifflet, RN, BSN, [place] Management Coordinator [phone]

## 2016-12-02 ENCOUNTER — Ambulatory Visit (INDEPENDENT_AMBULATORY_CARE_PROVIDER_SITE_OTHER): Payer: Medicare Other | Admitting: Physician Assistant

## 2016-12-02 ENCOUNTER — Encounter: Payer: Self-pay | Admitting: Physician Assistant

## 2016-12-02 VITALS — BP 130/80 | HR 96

## 2016-12-02 DIAGNOSIS — K5900 Constipation, unspecified: Secondary | ICD-10-CM | POA: Diagnosis not present

## 2016-12-02 NOTE — Patient Instructions (Signed)
Continue Miralax and Colace as needed.

## 2016-12-02 NOTE — Progress Notes (Addendum)
Chief Complaint: Constipation  HPI:  Alexis Higgins is a 76 year old Caucasian female with extensive medical history listed below, who presents to clinic today accompanied by her husband for a complaint of constipation.   Patient typically follows with Dr. Carlean Purl  and called our clinic on 11/29/16 with a complaint of severe constipation. Patient was told to increase her MiraLAX to 2-3 times per day, instead of prn.   Today, the patient presents to clinic accompanied by her husband and tells me that using the MiraLAX "a full cap with 8 ounces of water", sipping a little bit at a time, she is able to get in two doses per day and since starting this on Monday has been able to "move my bowels". Prior to that it was at least 9-10 days after her last BM, though the patient tells me that going a week can be normal for her. Her husband takes good care of her and tells me that typically she is able to control her constipation with nightly Colace, but on this occasion she had severe constipation. He does tell me of her recent medical history including 3 compression fractures just this year within the past 3 months requiring hospitalizations and multiple pain meds which he thinks may have contributed to her recent constipation. This constipation was associated with very hard to pass stools and some abdominal pain which is all relieved now.   We also discussed the fact that the patient is due for a colonoscopy at this time, but she and her husband have decided not to have any further colonoscopies due to her deteriorated condition at this time.   Patient denies fever, chills, blood in her stool, melena, weight loss, fatigue, anorexia, nausea, vomiting, heartburn, reflux or continued abdominal pain.   Past Medical History:  Diagnosis Date  . Abnormal chest x-ray    03/2012: will need OP f/u.  Marland Kitchen Anginal pain ([place])   . ANXIETY 01/01/2007  . BURSITIS, RIGHT HIP 06/04/2009  . CAD (coronary artery disease)    a. BMS to  LAD 2010. b. NSTEMI with DES to LAD for ISR 2011. c. Patent stent 03/2012/Imdur added.  . Cataract   . CHEST PAIN-PRECORDIAL 01/15/2009  . Colon polyps    H/o tubular adenoma of colon  . COPD 01/01/2007   a. Chronic resp failure on home O2.  Marland Kitchen DEPRESSION 01/01/2007  . Eczema 01/08/2011  . GROIN PAIN 06/20/2008  . Headache(784.0) 01/01/2007  . Hemorrhoid   . HYPERTENSION 01/01/2007  . Impaired glucose tolerance 01/07/2011  . LOW BACK PAIN 01/01/2007  . Muscle weakness (generalized) 06/04/2009  . On home oxygen therapy    "2L; 24/7" (09/10/2016)  . OSTEOARTHRITIS, HIP 07/01/2008  . OSTEOPOROSIS 01/01/2007  . Pneumonia 1998  . Rosacea 01/08/2011  . Shortness of breath   . SYNCOPE 01/01/2007  . Thoracic compression fracture ([place])   . TIA (transient ischemic attack)   . TRANSIENT ISCHEMIC ATTACK, HX OF 01/01/2007    Past Surgical History:  Procedure Laterality Date  . ABDOMINAL HYSTERECTOMY    . APPENDECTOMY    . BACK SURGERY    . BREAST ENHANCEMENT SURGERY    . COLONOSCOPY  01/25/2002   tubular adenoma,hemorrhoids, hyperplastic  colon polyps  . COLONOSCOPY  02/17/2005   hemorrhoids  . CORONARY ANGIOPLASTY    . CORONARY STENT PLACEMENT    . HIP ARTHROPLASTY Left 10/01/2013   Procedure: ARTHROPLASTY UNIPOLAR   HIP;  Surgeon: Marianna Payment, MD;  Location: [place];  Service:  Orthopedics;  Laterality: Left;  . HIP SURGERY Left    DR XU     PROXIMAL NECK   . IR GENERIC HISTORICAL  02/19/2016   IR VERTEBROPLASTY CERV/THOR BX INC UNI/BIL INC/INJECT/IMAGING 02/19/2016 Luanne Bras, MD [facility]-INTERV RAD  . IR GENERIC HISTORICAL  07/15/2016   IR KYPHO THORACIC WITH BONE BIOPSY 07/15/2016 Luanne Bras, MD [facility]-INTERV RAD  . IR KYPHO EA ADDL LEVEL THORACIC OR LUMBAR  09/14/2016  . IR RADIOLOGIST EVAL & MGMT  09/22/2016  . IR VERTEBROPLASTY CERV/THOR BX INC UNI/BIL INC/INJECT/IMAGING  10/08/2016  . IR VERTEBROPLASTY CERV/THOR BX INC UNI/BIL INC/INJECT/IMAGING  11/16/2016  . LEFT HEART CATHETERIZATION  WITH CORONARY ANGIOGRAM N/A 04/13/2012   Procedure: LEFT HEART CATHETERIZATION WITH CORONARY ANGIOGRAM;  Surgeon: Burnell Blanks, MD;  Location: [facility] CATH LAB;  Service: Cardiovascular;  Laterality: N/A;  . OOPHORECTOMY     one ovary  . s/p bilat cataract  2010  . sp lumbar disc surgury     [name]    Current Outpatient Prescriptions  Medication Sig Dispense Refill  . acetaminophen (TYLENOL) 500 MG tablet Take 1,000 mg by mouth as needed for moderate pain or headache.     Marland Kitchen ascorbic acid (VITAMIN C) 1000 MG tablet Take 1,000 mg by mouth daily.    . Cholecalciferol (VITAMIN D) 1000 UNITS capsule Take 1,000 Units by mouth every evening.     . clopidogrel (PLAVIX) 75 MG tablet Take 1 tablet (75 mg total) by mouth daily. TAKE 1 TABLET (75 MG TOTAL) BY MOUTH DAILY. 30 tablet 2  . docusate sodium (COLACE) 100 MG capsule Take 100 mg by mouth daily as needed for mild constipation.     . furosemide (LASIX) 20 MG tablet Take 1 tablet (20 mg total) by mouth daily. 90 tablet 1  . isosorbide mononitrate (IMDUR) 30 MG 24 hr tablet TAKE 0.5 TABLETS (15 MG TOTAL) BY MOUTH DAILY. 15 tablet 2  . Lidocaine HCl (PAIN RELIEF ROLL-ON EX) Apply 1 application topically 2 (two) times daily as needed (PAIN).     Marland Kitchen lovastatin (MEVACOR) 20 MG tablet TAKE 1 TABLET BY MOUTH AT BEDTIME (Patient taking differently: TAKE 20 MG BY MOUTH AT BEDTIME) 90 tablet 3  . metoprolol tartrate (LOPRESSOR) 25 MG tablet TAKE 12.5 MG BY MOUTH 2 TIMES A DAY  11  . nitroGLYCERIN (NITROSTAT) 0.4 MG SL tablet Place 1 tablet (0.4 mg total) under the tongue every 5 (five) minutes as needed for chest pain (up to 3 doses). 25 tablet 4  . nortriptyline (PAMELOR) 10 MG capsule Take 20 mg by mouth at bedtime.    . ondansetron (ZOFRAN) 4 MG tablet Take 1 tablet (4 mg total) by mouth every 8 (eight) hours as needed for nausea or vomiting. (Patient taking differently: Take 4 mg by mouth 2 (two) times daily. ) 11 tablet 0  . OXYGEN Inhale  into the lungs continuous.    . polyethylene glycol (MIRALAX / GLYCOLAX) packet Take 17 g by mouth daily as needed for mild constipation.    . polyvinyl alcohol (LIQUIFILM TEARS) 1.4 % ophthalmic solution Place 1 drop into both eyes daily as needed for dry eyes.     . predniSONE (DELTASONE) 10 MG tablet TAKE 1.5 TABLETS (15 MG TOTAL) BY MOUTH DAILY WITH BREAKFAST. 45 tablet 5  . PROAIR HFA 108 (90 Base) MCG/ACT inhaler INHALE 2 PUFFS INTO THE LUNGS EVERY 6 (SIX) HOURS AS NEEDED FOR WHEEZING OR SHORTNESS OF BREATH. 25.5 Inhaler 2  . tiotropium (  SPIRIVA HANDIHALER) 18 MCG inhalation capsule Place 1 capsule (18 mcg total) into inhaler and inhale daily at 6 (six) AM. 90 capsule 3  . traMADol (ULTRAM) 50 MG tablet Take 300 mg by mouth as needed.    . vitamin B-12 (CYANOCOBALAMIN) 1000 MCG tablet Take 1,000 mcg by mouth daily.     No current facility-administered medications for this visit.     Allergies as of 12/02/2016 - Review Complete 12/02/2016  Allergen Reaction Noted  . Aspirin Other (See Comments) 06/20/2008  . Codeine Anaphylaxis and Rash 01/01/2007  . Other Other (See Comments) 03/15/2014    Family History  Problem Relation Age of Onset  . Osteoporosis Mother   . Heart disease Sister   . Emphysema Sister   . Seizures Sister        epilepsy  . Cardiomyopathy Sister   . Heart attack Sister     Social History   Social History  . Marital status: Married    Spouse name: N/A  . Number of children: 4  . Years of education: N/A   Occupational History  . disabled back since 2003    Social History Main Topics  . Smoking status: Former Smoker    Packs/day: 1.50    Years: 51.00    Types: Cigarettes    Quit date: 07/24/2005  . Smokeless tobacco: Never Used  . Alcohol use No  . Drug use: No  . Sexual activity: Not on file   Other Topics Concern  . Not on file   Social History Narrative  . No narrative on file    Review of Systems:    Constitutional: No weight loss,  fever or chills Skin: No rash  Cardiovascular: No chest pain Respiratory: No SOB  Gastrointestinal: See HPI and otherwise negative Genitourinary: No dysuria  Neurological: No headache Musculoskeletal: Chronic back pain Hematologic: No bleeding  Psychiatric: No history of depression or anxiety   Physical Exam:  Vital signs: BP 130/80 (BP Location: Right Arm, Patient Position: Sitting, Cuff Size: Normal)   Pulse 96   Constitutional:   Pleasant Caucasian female appears to be in NAD, Well developed, Well nourished, alert and cooperative Head:  Normocephalic and atraumatic. Eyes:   PEERL, EOMI. No icterus. Conjunctiva pink. Ears:  Normal auditory acuity. Neck:  Supple Throat: Oral cavity and pharynx without inflammation, swelling or lesion.  Respiratory: Respirations even and unlabored. Lungs clear to auscultation bilaterally.   No wheezes, crackles, or rhonchi. On O2 via [place] Cardiovascular: Normal S1, S2. No MRG. Regular rate and rhythm. No peripheral edema, cyanosis or pallor.  Gastrointestinal:  Soft, nondistended, nontender. No rebound or guarding. Normal bowel sounds. No appreciable masses or hepatomegaly. Rectal:  Not performed.  Msk:  Symmetrical without gross deformities. Without edema, no deformity or joint abnormality. In wheelchair Neurologic:  Alert and  oriented x4;  grossly normal neurologically.  Skin:   Dry and intact without significant lesions or rashes. Psychiatric:  Demonstrates good judgement and reason without abnormal affect or behaviors.  MOST RECENT LABS AND IMAGING: CBC    Component Value Date/Time   WBC 8.3 11/15/2016 0419   RBC 3.98 11/15/2016 0419   HGB 11.3 (L) 11/15/2016 0419   HCT 37.9 11/15/2016 0419   PLT 297 11/15/2016 0419   MCV 95.2 11/15/2016 0419   MCH 28.4 11/15/2016 0419   MCHC 29.8 (L) 11/15/2016 0419   RDW 15.1 11/15/2016 0419   LYMPHSABS 2.2 11/13/2016 1157   MONOABS 1.4 (H) 11/13/2016 1157   EOSABS  0.2 11/13/2016 1157   BASOSABS  0.0 11/13/2016 1157    CMP     Component Value Date/Time   NA 141 11/15/2016 0419   K 4.2 11/15/2016 0419   CL 99 (L) 11/15/2016 0419   CO2 34 (H) 11/15/2016 0419   GLUCOSE 120 (H) 11/15/2016 0419   BUN 9 11/15/2016 0419   CREATININE 0.75 11/15/2016 0419   CALCIUM 8.8 (L) 11/15/2016 0419   PROT 5.0 (L) 11/15/2016 0419   ALBUMIN 2.7 (L) 11/15/2016 0419   AST 19 11/15/2016 0419   ALT 12 (L) 11/15/2016 0419   ALKPHOS 96 11/15/2016 0419   BILITOT 0.7 11/15/2016 0419   GFRNONAA >60 11/15/2016 0419   GFRAA >60 11/15/2016 0419    Assessment: 1. Constipation: Patient went 10 days without a bowel movement, now controlled on twice daily MiraLAX 2. Screening colonoscopy: Agree with the patient and her husband that due to her deteriorating condition would not recommend any further elective screening colonoscopies.  Plan: 1. Continue MiraLAX. Did discuss that she can use this up to 4 times a day on a regular basis, 1 capful mixed with a full 6-8 8 ounce glass of liquid. 2. Continue nightly Colace. May use MiraLAX when necessary for worsened constipation 3. Patient to follow in clinic as needed in the future. 4. Agree with the patient that due to her deteriorated condition would not recommend any further screening colonoscopies.  Ellouise Newer, PA-C [facility] 12/02/2016, 3:01 PM  Cc: Biagio Borg, MD   Agree with Ms. Lilliana Turner's evaluation and management.  Gatha Mayer, MD, [facility]

## 2016-12-03 ENCOUNTER — Ambulatory Visit (INDEPENDENT_AMBULATORY_CARE_PROVIDER_SITE_OTHER): Payer: Medicare Other | Admitting: Emergency Medicine

## 2016-12-03 ENCOUNTER — Encounter: Payer: Self-pay | Admitting: Emergency Medicine

## 2016-12-03 DIAGNOSIS — J439 Emphysema, unspecified: Secondary | ICD-10-CM | POA: Diagnosis not present

## 2016-12-03 NOTE — Assessment & Plan Note (Signed)
She has not tolerated SABA in the past. Also hsa been pred dependent due to dyspnea. She is open to a very slow taper to try to minimize the pred, avoid further bone fractures.   Please continue to take your Spiriva as you have been using it Continue to keep albuterol available to use 2 puffs as needed for shortness of breath Continue your oxygen at all times We will start to slowly taper your prednisone. Decrease to 14 mg daily for the next 2 weeks. If he tolerate this then we will continue to decrease by 1 mg every 2 weeks until you get down to 10 mg daily. When you are at 10 mg daily please continue this dose until we have another visit Follow with Dr Lamonte Sakai in 3 months or sooner if you have any problems.

## 2016-12-03 NOTE — Progress Notes (Signed)
Subjective:    Patient ID: Alexis Higgins, female    DOB: [birth year], 76 y.o.   MRN: [id number]  HPI 76 year old woman, former smoker (75 pack years) with a history of CAD, hypertension, cerebrovascular disease and COPD that has been followed by Dr Gwenette Greet.    PFT performed in August 2014 that I personally reviewed. These showed very severe obstruction with an FEV1 of 0.69 L or 32% of predicted, and a positive bronchodilator response.     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 Her bronchodilator therapy has been quite complicated due to side effects. She is currently using the Spiriva HandiHaler but I am concerned about the effectiveness of delivery. Unfortunately she poorly tolerates nebulizers due to agitation. Since our last visit she was having more dyspnea and increase her prednisone with a brief burst and then taper down to 20 mg daily. Her breathing has benefited from this.   ROV 05/06/16 -- This is a follow-up visit for severe COPD. She is prednisone and oxygen dependent. She is currently managed on Spiriva, prednisone 20 mg daily, oxygen at all times. Her breathing is a bit better than last visit. She still has trouble with rushing, with anxiety.        ROV 09/02/16 -- follow up visit for severe COPD, chronic hypoxemic resp failure on O2 , on 2L/min at all times. Has been having facial swelling on chronic pred 20mg . Also some increased anxiety.  I asked her to decrease to 10mg  last week. she has noticed more dyspnea since decreasing the pred. She also noted some sx dysphagia - I have ordered a MBS, scheduled for tomorrow. She is having LE edema, has been on lasix per Dr Jenny Reichmann.   ROV 12/03/16 -- Alexis Higgins has a history of severe COPD and chronic hypoxemic respiratory failure. We have treated her over the last year with chronic prednisone. Her course has been complicated by compression fractures and she has required  kyphoplasty on multiple occasions. Her current prednisone dose is 15mg  daily. She tried weaning to 10mg , most recently 2 months ago but breathing could not tolerate. She is having severe exertional dyspnea, walking across room at home.     Review of Systems  As per HPI.   Objective:   Physical Exam Vitals:   12/03/16 1434  BP: 122/60  Pulse: 93  SpO2: 96%  Weight: 133 lb (60.3 kg)  Height: 5\' 6"  (1.676 m)   Gen: Pleasant, thin elderly woman, kyphosis,  in no distress,  normal affect, no distress on 3 L/m  ENT: No lesions,  mouth clear,  oropharynx clear, no postnasal drip  Neck: No JVD, no stridor  Lungs: No use of accessory muscles,  B soft end exp wheezes.   Cardiovascular: RRR, heart sounds normal, no murmur or gallops, no peripheral edema  Musculoskeletal: No deformities, no cyanosis or clubbing  Neuro: alert, non focal  Skin: Warm, no lesions or rashes   Assessment & Plan:  COPD (chronic obstructive pulmonary disease) with emphysema ([place]) She has not tolerated SABA in the past. Also hsa been pred dependent due to dyspnea. She is open to a very slow taper to try to minimize the pred, avoid further bone fractures.   Please continue to take your Spiriva as you have been using it Continue to keep albuterol available to use 2 puffs as needed for shortness of breath Continue your oxygen at all times We will start to  slowly taper your prednisone. Decrease to 14 mg daily for the next 2 weeks. If he tolerate this then we will continue to decrease by 1 mg every 2 weeks until you get down to 10 mg daily. When you are at 10 mg daily please continue this dose until we have another visit Follow with Dr Lamonte Sakai in 3 months or sooner if you have any problems.   Baltazar Apo, MD, PhD 12/03/2016, 2:55 PM [facility] [phone] or if no answer [phone]

## 2016-12-03 NOTE — Patient Instructions (Signed)
Please continue to take your Spiriva as you have been using it Continue to keep albuterol available to use 2 puffs as needed for shortness of breath Continue your oxygen at all times We will start to slowly taper your prednisone. Decrease to 14 mg daily for the next 2 weeks. If he tolerate this then we will continue to decrease by 1 mg every 2 weeks until you get down to 10 mg daily. When you are at 10 mg daily please continue this dose until we have another visit Follow with Dr Lamonte Sakai in 3 months or sooner if you have any problems.

## 2016-12-07 ENCOUNTER — Ambulatory Visit: Admit: 2016-12-07 | Payer: Medicare Other

## 2016-12-07 ENCOUNTER — Ambulatory Visit (HOSPITAL_COMMUNITY): Payer: Medicare Other

## 2016-12-07 DIAGNOSIS — J449 Chronic obstructive pulmonary disease, unspecified: Secondary | ICD-10-CM | POA: Diagnosis not present

## 2016-12-07 DIAGNOSIS — M1991 Primary osteoarthritis, unspecified site: Secondary | ICD-10-CM | POA: Diagnosis not present

## 2016-12-07 DIAGNOSIS — I739 Peripheral vascular disease, unspecified: Secondary | ICD-10-CM | POA: Diagnosis not present

## 2016-12-07 DIAGNOSIS — R239 Unspecified skin changes: Secondary | ICD-10-CM | POA: Diagnosis not present

## 2016-12-07 DIAGNOSIS — F329 Major depressive disorder, single episode, unspecified: Secondary | ICD-10-CM | POA: Diagnosis not present

## 2016-12-07 DIAGNOSIS — Z955 Presence of coronary angioplasty implant and graft: Secondary | ICD-10-CM | POA: Diagnosis not present

## 2016-12-07 DIAGNOSIS — I2721 Secondary pulmonary arterial hypertension: Secondary | ICD-10-CM | POA: Diagnosis not present

## 2016-12-07 DIAGNOSIS — E785 Hyperlipidemia, unspecified: Secondary | ICD-10-CM | POA: Diagnosis not present

## 2016-12-07 DIAGNOSIS — I2510 Atherosclerotic heart disease of native coronary artery without angina pectoris: Secondary | ICD-10-CM | POA: Diagnosis not present

## 2016-12-07 DIAGNOSIS — J9611 Chronic respiratory failure with hypoxia: Secondary | ICD-10-CM | POA: Diagnosis not present

## 2016-12-07 DIAGNOSIS — Z7952 Long term (current) use of systemic steroids: Secondary | ICD-10-CM

## 2016-12-07 DIAGNOSIS — I10 Essential (primary) hypertension: Secondary | ICD-10-CM | POA: Diagnosis not present

## 2016-12-07 DIAGNOSIS — Z9981 Dependence on supplemental oxygen: Secondary | ICD-10-CM

## 2016-12-07 DIAGNOSIS — F411 Generalized anxiety disorder: Secondary | ICD-10-CM | POA: Diagnosis not present

## 2016-12-07 DIAGNOSIS — Z4800 Encounter for change or removal of nonsurgical wound dressing: Secondary | ICD-10-CM

## 2016-12-07 DIAGNOSIS — Z87310 Personal history of (healed) osteoporosis fracture: Secondary | ICD-10-CM

## 2016-12-07 DIAGNOSIS — Z96642 Presence of left artificial hip joint: Secondary | ICD-10-CM

## 2016-12-07 DIAGNOSIS — Z87891 Personal history of nicotine dependence: Secondary | ICD-10-CM

## 2016-12-07 DIAGNOSIS — Z9889 Other specified postprocedural states: Secondary | ICD-10-CM

## 2016-12-07 SURGERY — RADIOLOGY WITH ANESTHESIA
Anesthesia: General

## 2016-12-10 ENCOUNTER — Telehealth: Payer: Self-pay

## 2016-12-10 ENCOUNTER — Other Ambulatory Visit: Payer: Self-pay

## 2016-12-10 NOTE — Patient Outreach (Signed)
[facility]) Care Management  12/10/16  SOLYANA NONAKA [birth year] [id number]  Successful outreach completed with patient and husband, Genecis Veley. Patient identification verified. Both Mr. And Mrs. Eisen were present on the phone.   Mr. Depace stated that she went to see the pulmonologist last Friday and saw the GI doctor last Thursday as scheduled. He stated that the appointments went "fairly well, I guess."   Mr. Kendrick stated that the pulmonologist started to cut her prednisone by 1 mg every 2 weeks. She is currently finising the first week at 14 mg and appears to be doing "ok with it for now." The plan to decrease another 1 mg in another week as long as she tolerates the 14mg  ok. They hope that gradual and slow weaning will help her to be more successful.   He stated that they have been talking with her PCP about putting her on Prolia. However, it is very expensive and they have the  phone number to see if they qualify for assistance. He stated that usually she does not qualify for anything, and voices that they cannot afford the copays of over $200 for this medication. He stated that she would get 2 shots per year and the regurlar price is $3500 for each injection. He stated that her insurance will handle all of it but $250-$260, but they just cannot afford it. RNCM encouraged them to go ahead and call to see if she would qualify. RNCM reviewed the website for the [facility] with them and discussed the eligibility and encouraged them to attempt to see if they would qualify as it looks promising. He stated he would call them next week to check on it.   RNCM assessed how patient was feeling and she stated, "I feel good." She reported that the only thing bothering her today was when she got stung by a bee earlier, but currently has no complaints or concerns.    Plan: RNCM to continue to follow for transition of care next week and patient in agreement. Her husband  is to try to reach someone at [facility] to determine if she would be eligible for assistance with Prolia. Encouraged to call with any questions or concerns.   [place] R. Jonuel Butterfield, RN, BSN, Industry Management Coordinator [phone]

## 2016-12-10 NOTE — Telephone Encounter (Signed)
Advised patient that insurance has been verified for prolia injectiions, estimated copay is $260---patient is calling [employer] to see if she qualifies for assistance with copay cost---patient will call me back and let me know if she wants to start prolia injections

## 2016-12-14 DIAGNOSIS — J449 Chronic obstructive pulmonary disease, unspecified: Secondary | ICD-10-CM | POA: Diagnosis not present

## 2016-12-15 ENCOUNTER — Other Ambulatory Visit: Payer: Self-pay | Admitting: Cardiovascular Disease

## 2016-12-17 ENCOUNTER — Encounter: Payer: Self-pay | Admitting: Emergency Medicine

## 2016-12-17 ENCOUNTER — Other Ambulatory Visit: Payer: Self-pay

## 2016-12-17 DIAGNOSIS — J449 Chronic obstructive pulmonary disease, unspecified: Secondary | ICD-10-CM | POA: Diagnosis not present

## 2016-12-17 NOTE — Patient Outreach (Signed)
[facility]) Care Management  12/17/16  MELANEE CORDIAL [birth year] [id number]  Successful outreach completed with patient's husband, Rex Magee. Patient identification verified.  Patient's husband stated that she has been doing "fairly well." Per Percell Miller, patient was unable to tolerate the taper off of her prednisone and had to increase back to 15mg  this morning. They have made Dr. Lamonte Sakai aware of this and will discuss it further at her next appointment there.  Ed stated that they have contacted the number about funding for the Prolia, but she would have to start the medication within the first 30 days of receiving the grant or she would lose the grant funding. He stated that it sounds like they would be able to pay for her copay of the Prolia, but before they move forward, they are wanting to research it a little more. They continue to be very concerned about the side effects, particularly hearing the word "death" in commercials for this medication. RNCM to send literature to them regarding Prolia, including side effects, and allow them to weigh the risks vs. Benefits before deciding to move forward.   Plan: RNCM to send literature to Mr. & Mrs. Barz regarding Prolia for them to review within the next week. RNCM to continue to follow up for transition of care and patient and husband agreeable with plan. Encouraged to call with any questions or concerns.  [place] R. Sola Margolis, RN, BSN, [place] Management Coordinator [phone]

## 2016-12-18 ENCOUNTER — Other Ambulatory Visit: Payer: Self-pay | Admitting: Cardiovascular Disease

## 2016-12-24 ENCOUNTER — Encounter (HOSPITAL_COMMUNITY): Payer: Self-pay

## 2016-12-24 ENCOUNTER — Emergency Department (HOSPITAL_COMMUNITY): Payer: Medicare Other

## 2016-12-24 ENCOUNTER — Observation Stay (HOSPITAL_COMMUNITY)
Admission: EM | Admit: 2016-12-24 | Discharge: 2016-12-26 | Disposition: A | Discharge status: Home / Self Care | Payer: Medicare Other | Attending: Family Medicine | Admitting: Family Medicine

## 2016-12-24 DIAGNOSIS — Z8262 Family history of osteoporosis: Secondary | ICD-10-CM | POA: Insufficient documentation

## 2016-12-24 DIAGNOSIS — J441 Chronic obstructive pulmonary disease with (acute) exacerbation: Secondary | ICD-10-CM

## 2016-12-24 DIAGNOSIS — R05 Cough: Secondary | ICD-10-CM | POA: Diagnosis not present

## 2016-12-24 DIAGNOSIS — I700 Atherosclerosis of aorta: Secondary | ICD-10-CM | POA: Insufficient documentation

## 2016-12-24 DIAGNOSIS — X58XXXS Exposure to other specified factors, sequela: Secondary | ICD-10-CM | POA: Diagnosis not present

## 2016-12-24 DIAGNOSIS — Z86010 Personal history of colonic polyps: Secondary | ICD-10-CM | POA: Diagnosis not present

## 2016-12-24 DIAGNOSIS — J9611 Chronic respiratory failure with hypoxia: Secondary | ICD-10-CM | POA: Insufficient documentation

## 2016-12-24 DIAGNOSIS — E876 Hypokalemia: Secondary | ICD-10-CM | POA: Diagnosis not present

## 2016-12-24 DIAGNOSIS — I252 Old myocardial infarction: Secondary | ICD-10-CM | POA: Diagnosis not present

## 2016-12-24 DIAGNOSIS — I2510 Atherosclerotic heart disease of native coronary artery without angina pectoris: Secondary | ICD-10-CM

## 2016-12-24 DIAGNOSIS — Z888 Allergy status to other drugs, medicaments and biological substances status: Secondary | ICD-10-CM | POA: Insufficient documentation

## 2016-12-24 DIAGNOSIS — R079 Chest pain, unspecified: Secondary | ICD-10-CM | POA: Diagnosis not present

## 2016-12-24 DIAGNOSIS — M545 Low back pain: Secondary | ICD-10-CM | POA: Diagnosis not present

## 2016-12-24 DIAGNOSIS — R748 Abnormal levels of other serum enzymes: Secondary | ICD-10-CM | POA: Insufficient documentation

## 2016-12-24 DIAGNOSIS — Z7902 Long term (current) use of antithrombotics/antiplatelets: Secondary | ICD-10-CM | POA: Insufficient documentation

## 2016-12-24 DIAGNOSIS — Z7952 Long term (current) use of systemic steroids: Secondary | ICD-10-CM | POA: Diagnosis not present

## 2016-12-24 DIAGNOSIS — Z87891 Personal history of nicotine dependence: Secondary | ICD-10-CM | POA: Insufficient documentation

## 2016-12-24 DIAGNOSIS — Z90710 Acquired absence of both cervix and uterus: Secondary | ICD-10-CM | POA: Insufficient documentation

## 2016-12-24 DIAGNOSIS — M810 Age-related osteoporosis without current pathological fracture: Secondary | ICD-10-CM | POA: Insufficient documentation

## 2016-12-24 DIAGNOSIS — Z8249 Family history of ischemic heart disease and other diseases of the circulatory system: Secondary | ICD-10-CM | POA: Insufficient documentation

## 2016-12-24 DIAGNOSIS — M1610 Unilateral primary osteoarthritis, unspecified hip: Secondary | ICD-10-CM | POA: Insufficient documentation

## 2016-12-24 DIAGNOSIS — M719 Bursopathy, unspecified: Secondary | ICD-10-CM | POA: Insufficient documentation

## 2016-12-24 DIAGNOSIS — Z955 Presence of coronary angioplasty implant and graft: Secondary | ICD-10-CM | POA: Diagnosis not present

## 2016-12-24 DIAGNOSIS — F411 Generalized anxiety disorder: Secondary | ICD-10-CM | POA: Insufficient documentation

## 2016-12-24 DIAGNOSIS — L309 Dermatitis, unspecified: Secondary | ICD-10-CM | POA: Insufficient documentation

## 2016-12-24 DIAGNOSIS — Z9841 Cataract extraction status, right eye: Secondary | ICD-10-CM | POA: Insufficient documentation

## 2016-12-24 DIAGNOSIS — I2721 Secondary pulmonary arterial hypertension: Secondary | ICD-10-CM | POA: Insufficient documentation

## 2016-12-24 DIAGNOSIS — Z8673 Personal history of transient ischemic attack (TIA), and cerebral infarction without residual deficits: Secondary | ICD-10-CM | POA: Insufficient documentation

## 2016-12-24 DIAGNOSIS — R0602 Shortness of breath: Secondary | ICD-10-CM | POA: Diagnosis not present

## 2016-12-24 DIAGNOSIS — L719 Rosacea, unspecified: Secondary | ICD-10-CM | POA: Diagnosis not present

## 2016-12-24 DIAGNOSIS — M21371 Foot drop, right foot: Secondary | ICD-10-CM | POA: Insufficient documentation

## 2016-12-24 DIAGNOSIS — R1310 Dysphagia, unspecified: Secondary | ICD-10-CM | POA: Insufficient documentation

## 2016-12-24 DIAGNOSIS — I25110 Atherosclerotic heart disease of native coronary artery with unstable angina pectoris: Secondary | ICD-10-CM | POA: Diagnosis not present

## 2016-12-24 DIAGNOSIS — Z9981 Dependence on supplemental oxygen: Secondary | ICD-10-CM | POA: Diagnosis not present

## 2016-12-24 DIAGNOSIS — Z79899 Other long term (current) drug therapy: Secondary | ICD-10-CM | POA: Insufficient documentation

## 2016-12-24 DIAGNOSIS — R072 Precordial pain: Secondary | ICD-10-CM

## 2016-12-24 DIAGNOSIS — I10 Essential (primary) hypertension: Secondary | ICD-10-CM | POA: Insufficient documentation

## 2016-12-24 DIAGNOSIS — I739 Peripheral vascular disease, unspecified: Secondary | ICD-10-CM | POA: Insufficient documentation

## 2016-12-24 DIAGNOSIS — R0789 Other chest pain: Secondary | ICD-10-CM | POA: Diagnosis not present

## 2016-12-24 DIAGNOSIS — F329 Major depressive disorder, single episode, unspecified: Secondary | ICD-10-CM | POA: Insufficient documentation

## 2016-12-24 DIAGNOSIS — F418 Other specified anxiety disorders: Secondary | ICD-10-CM | POA: Diagnosis present

## 2016-12-24 DIAGNOSIS — J984 Other disorders of lung: Secondary | ICD-10-CM | POA: Insufficient documentation

## 2016-12-24 DIAGNOSIS — J439 Emphysema, unspecified: Secondary | ICD-10-CM | POA: Diagnosis not present

## 2016-12-24 DIAGNOSIS — Z9842 Cataract extraction status, left eye: Secondary | ICD-10-CM | POA: Insufficient documentation

## 2016-12-24 DIAGNOSIS — Z886 Allergy status to analgesic agent status: Secondary | ICD-10-CM | POA: Insufficient documentation

## 2016-12-24 DIAGNOSIS — Z825 Family history of asthma and other chronic lower respiratory diseases: Secondary | ICD-10-CM | POA: Insufficient documentation

## 2016-12-24 DIAGNOSIS — E785 Hyperlipidemia, unspecified: Secondary | ICD-10-CM | POA: Diagnosis not present

## 2016-12-24 DIAGNOSIS — S22069A Unspecified fracture of T7-T8 vertebra, initial encounter for closed fracture: Secondary | ICD-10-CM | POA: Insufficient documentation

## 2016-12-24 DIAGNOSIS — Z885 Allergy status to narcotic agent status: Secondary | ICD-10-CM | POA: Insufficient documentation

## 2016-12-24 LAB — CBC
HCT: 38.2 % (ref 36.0–46.0)
Hemoglobin: 11.7 g/dL — ABNORMAL LOW (ref 12.0–15.0)
MCH: 27.9 pg (ref 26.0–34.0)
MCHC: 30.6 g/dL (ref 30.0–36.0)
MCV: 91.2 fL (ref 78.0–100.0)
Platelets: 253 10*3/uL (ref 150–400)
RBC: 4.19 MIL/uL (ref 3.87–5.11)
RDW: 13.5 % (ref 11.5–15.5)
WBC: 12 10*3/uL — ABNORMAL HIGH (ref 4.0–10.5)

## 2016-12-24 LAB — BASIC METABOLIC PANEL
Anion gap: 9 (ref 5–15)
BUN: 7 mg/dL (ref 6–20)
CALCIUM: 9.3 mg/dL (ref 8.9–10.3)
CO2: 32 mmol/L (ref 22–32)
CREATININE: 0.83 mg/dL (ref 0.44–1.00)
Chloride: 101 mmol/L (ref 101–111)
GFR calc Af Amer: >60 mL/min (ref >60)
GFR calc non Af Amer: >60 mL/min (ref >60)
GLUCOSE: 129 mg/dL — AB (ref 65–99)
Potassium: 3.1 mmol/L — ABNORMAL LOW (ref 3.5–5.1)
Sodium: 142 mmol/L (ref 135–145)

## 2016-12-24 LAB — D-DIMER, QUANTITATIVE (NOT AT ARMC): D DIMER QUANT: 0.79 ug{FEU}/mL — AB (ref 0.00–0.50)

## 2016-12-24 LAB — I-STAT TROPONIN, ED: Troponin i, poc: 0 ng/mL (ref 0.00–0.08)

## 2016-12-24 LAB — TROPONIN I: Troponin I: <0.03 ng/mL (ref <0.03)

## 2016-12-24 MED ORDER — VITAMIN B-12 1000 MCG PO TABS
1000.0000 ug | ORAL_TABLET | Freq: Every day | ORAL | Status: DC
  Administered 2016-12-25 – 2016-12-26 (×2): 1000 ug via ORAL
  Filled 2016-12-24 (×2): qty 1

## 2016-12-24 MED ORDER — PRAVASTATIN SODIUM 40 MG PO TABS
20.0000 mg | ORAL_TABLET | Freq: Every day | ORAL | Status: DC
  Filled 2016-12-24: qty 1

## 2016-12-24 MED ORDER — LIDOCAINE HCL 4 % EX LIQD: 1.0000 "application " | Freq: Two times a day (BID) | CUTANEOUS | Status: DC | PRN

## 2016-12-24 MED ORDER — ONDANSETRON HCL 4 MG/2ML IJ SOLN
4.0000 mg | Freq: Four times a day (QID) | INTRAMUSCULAR | Status: DC | PRN
  Administered 2016-12-25 (×2): 4 mg via INTRAVENOUS
  Filled 2016-12-24 (×2): qty 2

## 2016-12-24 MED ORDER — POTASSIUM CHLORIDE CRYS ER 20 MEQ PO TBCR
20.0000 meq | EXTENDED_RELEASE_TABLET | Freq: Once | ORAL | Status: AC
  Administered 2016-12-24: 20 meq via ORAL
  Filled 2016-12-24: qty 1

## 2016-12-24 MED ORDER — VITAMIN D 1000 UNITS PO TABS
1000.0000 [IU] | ORAL_TABLET | Freq: Every evening | ORAL | Status: DC
  Administered 2016-12-25: 1000 [IU] via ORAL
  Filled 2016-12-24 (×2): qty 1

## 2016-12-24 MED ORDER — PREDNISONE 5 MG PO TABS
15.0000 mg | ORAL_TABLET | Freq: Every day | ORAL | Status: DC
  Administered 2016-12-25 – 2016-12-26 (×2): 15 mg via ORAL
  Filled 2016-12-24 (×2): qty 1

## 2016-12-24 MED ORDER — MAGNESIUM SULFATE IN D5W 1-5 GM/100ML-% IV SOLN
1.0000 g | Freq: Once | INTRAVENOUS | Status: AC
  Administered 2016-12-25: 1 g via INTRAVENOUS
  Filled 2016-12-24: qty 100

## 2016-12-24 MED ORDER — NORTRIPTYLINE HCL 10 MG PO CAPS
20.0000 mg | ORAL_CAPSULE | Freq: Every day | ORAL | Status: DC
  Administered 2016-12-24 – 2016-12-25 (×2): 20 mg via ORAL
  Filled 2016-12-24 (×2): qty 2

## 2016-12-24 MED ORDER — DOCUSATE SODIUM 100 MG PO CAPS: 100.0000 mg | ORAL_CAPSULE | Freq: Every day | ORAL | Status: DC | PRN

## 2016-12-24 MED ORDER — MAGNESIUM SULFATE 2 GM/50ML IV SOLN
INTRAVENOUS | Status: AC
  Filled 2016-12-24: qty 50

## 2016-12-24 MED ORDER — METOPROLOL TARTRATE 12.5 MG HALF TABLET
12.5000 mg | ORAL_TABLET | Freq: Two times a day (BID) | ORAL | Status: DC
  Administered 2016-12-24 – 2016-12-25 (×2): 12.5 mg via ORAL
  Filled 2016-12-24 (×2): qty 1

## 2016-12-24 MED ORDER — CLOPIDOGREL BISULFATE 75 MG PO TABS
75.0000 mg | ORAL_TABLET | Freq: Every day | ORAL | Status: DC
  Administered 2016-12-25 – 2016-12-26 (×2): 75 mg via ORAL
  Filled 2016-12-24 (×2): qty 1

## 2016-12-24 MED ORDER — TRAMADOL HCL 50 MG PO TABS
100.0000 mg | ORAL_TABLET | Freq: Four times a day (QID) | ORAL | Status: DC | PRN
  Administered 2016-12-25 (×2): 100 mg via ORAL
  Filled 2016-12-24 (×2): qty 2

## 2016-12-24 MED ORDER — IPRATROPIUM-ALBUTEROL 0.5-2.5 (3) MG/3ML IN SOLN
3.0000 mL | RESPIRATORY_TRACT | Status: DC | PRN
  Administered 2016-12-24: 3 mL via RESPIRATORY_TRACT
  Filled 2016-12-24: qty 3

## 2016-12-24 MED ORDER — ISOSORBIDE MONONITRATE ER 30 MG PO TB24
30.0000 mg | ORAL_TABLET | Freq: Every day | ORAL | Status: DC
  Administered 2016-12-25 – 2016-12-26 (×2): 30 mg via ORAL
  Filled 2016-12-24 (×2): qty 1

## 2016-12-24 MED ORDER — FENTANYL CITRATE (PF) 100 MCG/2ML IJ SOLN: 25.0000 ug | INTRAMUSCULAR | Status: DC | PRN

## 2016-12-24 MED ORDER — POLYETHYLENE GLYCOL 3350 17 G PO PACK: 17.0000 g | PACK | Freq: Every day | ORAL | Status: DC | PRN

## 2016-12-24 MED ORDER — ACETAMINOPHEN 325 MG PO TABS: 650.0000 mg | ORAL_TABLET | ORAL | Status: DC | PRN

## 2016-12-24 MED ORDER — NITROGLYCERIN 0.4 MG SL SUBL
0.4000 mg | SUBLINGUAL_TABLET | SUBLINGUAL | Status: DC | PRN
  Administered 2016-12-25: 0.4 mg via SUBLINGUAL
  Filled 2016-12-24: qty 1

## 2016-12-24 MED ORDER — POTASSIUM CHLORIDE 10 MEQ/100ML IV SOLN
10.0000 meq | INTRAVENOUS | Status: AC
  Administered 2016-12-24 – 2016-12-25 (×2): 10 meq via INTRAVENOUS
  Filled 2016-12-24 (×2): qty 100

## 2016-12-24 MED ORDER — ENOXAPARIN SODIUM 40 MG/0.4ML ~~LOC~~ SOLN
40.0000 mg | SUBCUTANEOUS | Status: DC
  Administered 2016-12-24 – 2016-12-25 (×2): 40 mg via SUBCUTANEOUS
  Filled 2016-12-24 (×2): qty 0.4

## 2016-12-24 MED ORDER — TIOTROPIUM BROMIDE MONOHYDRATE 18 MCG IN CAPS
18.0000 ug | ORAL_CAPSULE | Freq: Every day | RESPIRATORY_TRACT | Status: DC
  Administered 2016-12-25: 18 ug via RESPIRATORY_TRACT
  Filled 2016-12-24: qty 5

## 2016-12-24 MED ORDER — POLYVINYL ALCOHOL 1.4 % OP SOLN
1.0000 [drp] | Freq: Every day | OPHTHALMIC | Status: DC | PRN
  Filled 2016-12-24: qty 15

## 2016-12-24 NOTE — ED Provider Notes (Signed)
[facility] DEPT Provider Note   CSN: [id number] Arrival date & time: 12/24/16  1707     History   Chief Complaint Chief Complaint  Patient presents with  . Chest Pain    HPI Alexis Higgins is a 76 y.o. female presenting with 2 days of chest pressure and increased dyspnea.  Patient states that yesterday she started to notice some chest pressure after minimal exertion. At rest, the pressure improved, but did not always completely go away. Additionally, she has had slight increase cough over the past several days, which increases the chest pressure. She reports increased shortness of breath over the past several days. She denies fever, chills, dizziness, nausea, vomiting, abdominal pain, urinary symptoms, or abnormal bowel movements. She was last seen by her cardiologist about a year ago, and was told that her heart was doing well and there is no change. She has been working with Dr. Lamonte Sakai, her pulmonologist, to improve her lung function, but last PFT showed 30% function. She has had no change in her medications, and has been taking her blood thinner as prescribed. Currently, she states that her pain is improved with sublingual nitroglycerin given by EMS. Additionally, she was given a nebulizer treatment and Solu-Medrol in route to the hospital. It reduced her pain from an 8 to a 2. She now reports pain only with movement or exertion.    HPI  Past Medical History:  Diagnosis Date  . Abnormal chest x-ray    03/2012: will need OP f/u.  Marland Kitchen Anginal pain ([place])   . ANXIETY 01/01/2007  . BURSITIS, RIGHT HIP 06/04/2009  . CAD (coronary artery disease)    a. BMS to LAD 2010. b. NSTEMI with DES to LAD for ISR 2011. c. Patent stent 03/2012/Imdur added.  . Cataract   . CHEST PAIN-PRECORDIAL 01/15/2009  . Colon polyps    H/o tubular adenoma of colon  . COPD 01/01/2007   a. Chronic resp failure on home O2.  Marland Kitchen DEPRESSION 01/01/2007  . Eczema 01/08/2011  . GROIN PAIN 06/20/2008  . Headache(784.0)  01/01/2007  . Hemorrhoid   . HYPERTENSION 01/01/2007  . Impaired glucose tolerance 01/07/2011  . LOW BACK PAIN 01/01/2007  . Muscle weakness (generalized) 06/04/2009  . On home oxygen therapy    "2L; 24/7" (09/10/2016)  . OSTEOARTHRITIS, HIP 07/01/2008  . OSTEOPOROSIS 01/01/2007  . Pneumonia 1998  . Rosacea 01/08/2011  . Shortness of breath   . SYNCOPE 01/01/2007  . Thoracic compression fracture ([place])   . TIA (transient ischemic attack)   . TRANSIENT ISCHEMIC ATTACK, HX OF 01/01/2007    Patient Active Problem List   Diagnosis Date Noted  . Back pain 10/06/2016  . Fracture of seventh thoracic vertebra ([place]) 10/06/2016  . Non-traumatic compression fracture of T7 thoracic vertebra ([facility])   . Thoracic compression fracture ([place])   . Intractable back pain 09/10/2016  . Chest pain 09/10/2016  . Closed compression fracture of thoracic vertebra ([place])   . 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 Dysphagia 09-07-2016  . Peripheral edema 06/15/2016  . Smoker 08/02/2014  . Peripheral vascular disease ([place]) 08/02/2014  . Abdominal pain, LLQ 11/22/2013  . Black stools 11/22/2013  . Heme positive stool 11/22/2013  . Acute blood loss anemia 10/24/2013  . Displaced fracture of left femoral neck ([place]) 09/30/2013  . Fracture of femoral neck, left, closed ([place]) 09/30/2013  . Fall at home 09/30/2013  . Head contusion 09/30/2013  . Leg weakness, bilateral 07/21/2012  . Right foot  drop 07/21/2012  . Unstable angina pectoris ([place]) 04/12/2012  . Chronic respiratory failure ([place]) 02/13/2012  . Localized swelling, mass and lump, neck 01/06/2012  . CAD (coronary artery disease) 12/14/2011  . Dyspnea 11/22/2011  . Personal history of colonic polyps 09/06/2011  . Eczema 01/08/2011  . Rosacea 01/08/2011  . Dizziness - light-headed 01/08/2011  . Encounter for long-term (current) use of high-risk medication 01/08/2011  . Impaired glucose tolerance 01/07/2011  . Preventative health care  01/07/2011  . BURSITIS, RIGHT HIP 06/04/2009  . CAD, NATIVE VESSEL 01/15/2009  . Other symptoms involving cardiovascular system 01/15/2009  . CHEST PAIN-PRECORDIAL 01/15/2009  . OSTEOARTHRITIS, HIP 07/01/2008  . Hyperlipidemia 01/01/2007  . Anxiety state 01/01/2007  . Depression 01/01/2007  . Essential hypertension 01/01/2007  . 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 LOW BACK PAIN 01/01/2007  . Osteoporosis 01/01/2007  . SYNCOPE 01/01/2007  . Headache(784.0) 01/01/2007  . TRANSIENT ISCHEMIC ATTACK, HX OF 01/01/2007    Past Surgical History:  Procedure Laterality Date  . ABDOMINAL HYSTERECTOMY    . APPENDECTOMY    . BACK SURGERY    . BREAST ENHANCEMENT SURGERY    . COLONOSCOPY  01/25/2002   tubular adenoma,hemorrhoids, hyperplastic  colon polyps  . COLONOSCOPY  02/17/2005   hemorrhoids  . CORONARY ANGIOPLASTY    . CORONARY STENT PLACEMENT    . HIP ARTHROPLASTY Left 10/01/2013   Procedure: ARTHROPLASTY UNIPOLAR   HIP;  Surgeon: Marianna Payment, MD;  Location: [facility];  Service: Orthopedics;  Laterality: Left;  . HIP SURGERY Left    DR XU     PROXIMAL NECK   . IR GENERIC HISTORICAL  02/19/2016   IR VERTEBROPLASTY CERV/THOR BX INC UNI/BIL INC/INJECT/IMAGING 02/19/2016 Luanne Bras, MD [facility]-INTER[facility] RAD  . IR GENERIC HISTORICAL  07/15/2016   IR KYPHO THORACIC WITH BONE BIOPSY 07/15/2016 Luanne Bras, MD [facility]INTER[facility]  . IR KYPHO EA ADDL LEVEL THORACIC OR LUMBAR  09/14/2016  . IR RADIOLOGIST EVAL & MGMT  09/22/2016  . IR VERTEBROPLASTY CERV/THOR BX INC UNI/BIL INC/INJECT/IMAGING  10/08/2016  . IR VERTEBROPLASTY CERV/THOR BX INC UNI/BIL INC/INJECT/IMAGING  11/16/2016  . LEFT HEART CATHETERIZATION WITH CORONARY ANGIOGRAM N/A 04/13/2012   Procedure: LEFT HEART CATHETERIZATION WITH CORONARY ANGIOGRAM;  Surgeon: Burnell Blanks, MD;  Location[facility] CATH LAB;  Service: Cardiovascular;  Laterality: N/A;  . OOPHORECTOMY     one ovary  . s/p  bilat cataract  2010  . sp lumbar disc surgury     [name]    OB History    No data available       Home Medications    Prior to Admission medications   Medication Sig Start Date End Date Taking? Authorizing Provider  acetaminophen (TYLENOL) 500 MG tablet Take 1,000 mg by mouth as needed for moderate pain or headache.    Yes [provider]  ascorbic acid (VITAMIN C) 1000 MG tablet Take 1,000 mg by mouth daily.   Yes [provider]  Cholecalciferol (VITAMIN D) 1000 UNITS capsule Take 1,000 Units by mouth every evening.    Yes [provider]  clopidogrel (PLAVIX) 75 MG tablet Take 1 tablet (75 mg total) by mouth daily. 12/15/16  Yes Burnell Blanks, MD  docusate sodium (COLACE) 100 MG capsule Take 100 mg by mouth daily as needed for mild constipation.    Yes [provider]  isosorbide mononitrate (IMDUR) 30 MG 24 hr tablet TAKE 0.5 TABLETS (15 MG TOTAL) BY MOUTH  DAILY. 11/29/16  Yes Burnell Blanks, MD  Lidocaine HCl (PAIN RELIEF ROLL-ON EX) Apply 1 application topically 2 (two) times daily as needed (PAIN).    Yes [provider]  lovastatin (MEVACOR) 20 MG tablet TAKE 1 TABLET BY MOUTH AT BEDTIME Patient taking differently: TAKE 20 MG BY MOUTH AT BEDTIME 03/10/16  Yes Biagio Borg, MD  metoprolol tartrate (LOPRESSOR) 25 MG tablet Take 0.5 tablets (12.5 mg total) by mouth 2 (two) times daily. 12/15/16  Yes Burnell Blanks, MD  nitroGLYCERIN (NITROSTAT) 0.4 MG SL tablet Place 1 tablet (0.4 mg total) under the tongue every 5 (five) minutes as needed for chest pain (up to 3 doses). 04/13/12  Yes Dunn, Dayna N, PA-C  nortriptyline (PAMELOR) 10 MG capsule Take 20 mg by mouth at bedtime.   Yes [provider]  ondansetron (ZOFRAN) 4 MG tablet Take 1 tablet (4 mg total) by mouth every 8 (eight) hours as needed for nausea or vomiting. Patient taking differently: Take 4 mg by mouth 2 (two) times daily.  01/06/16   Yes Mackuen, Courteney Lyn, MD  OXYGEN Inhale 2 L into the lungs continuous.    Yes [provider]  polyethylene glycol (MIRALAX / GLYCOLAX) packet Take 17 g by mouth daily as needed for mild constipation.   Yes [provider]  polyvinyl alcohol (LIQUIFILM TEARS) 1.4 % ophthalmic solution Place 1 drop into both eyes daily as needed for dry eyes.    Yes [provider]  predniSONE (DELTASONE) 10 MG tablet TAKE 1.5 TABLETS (15 MG TOTAL) BY MOUTH DAILY WITH BREAKFAST. 11/29/16  Yes Byrum, Rose Fillers, MD  PROAIR HFA 108 [phone] Base) MCG/ACT inhaler INHALE 2 PUFFS INTO THE LUNGS EVERY 6 (SIX) HOURS AS NEEDED FOR WHEEZING OR SHORTNESS OF BREATH. 07/20/16  Yes Biagio Borg, MD  tiotropium (SPIRIVA HANDIHALER) 18 MCG inhalation capsule Place 1 capsule (18 mcg total) into inhaler and inhale daily at 6 (six) AM. 05/26/16  Yes Biagio Borg, MD  traMADol (ULTRAM) 50 MG tablet Take 100 mg by mouth every 6 (six) hours as needed for moderate pain.    Yes [provider]  vitamin B-12 (CYANOCOBALAMIN) 1000 MCG tablet Take 1,000 mcg by mouth daily.   Yes [provider]  furosemide (LASIX) 20 MG tablet Take 1 tablet (20 mg total) by mouth daily. 09/29/16   Biagio Borg, MD    Family History Family History  Problem Relation Age of Onset  . Osteoporosis Mother   . Heart disease Sister   . Emphysema Sister   . Seizures Sister        epilepsy  . Cardiomyopathy Sister   . Heart attack Sister     Social History Social History  Substance Use Topics  . Smoking status: Former Smoker    Packs/day: 1.50    Years: 51.00    Types: Cigarettes    Quit date: 07/24/2005  . Smokeless tobacco: Never Used  . Alcohol use No     Allergies   Aspirin; Codeine; and Other   Review of Systems Review of Systems  Respiratory: Positive for cough and shortness of breath.   Cardiovascular: Positive for chest pain.  All other systems reviewed and are negative.    Physical  Exam Updated Vital Signs BP (!) 139/92 (BP Location: Left Arm)   Pulse (!) 118   Temp 98.1 F (36.7 C) (Oral)   Resp 20   Ht 5\' 6"  (1.676 m)   Wt 59.3  kg (130 lb 12.8 oz)   SpO2 99%   BMI 21.11 kg/m   Physical Exam  Constitutional: She is oriented to person, place, and time. She appears well-developed and well-nourished. No distress.  HENT:  Head: Normocephalic and atraumatic.  Eyes: Pupils are equal, round, and reactive to light. EOM are normal.  Neck: Normal range of motion.  Cardiovascular: Regular rhythm and intact distal pulses.  Tachycardia present.   Pulmonary/Chest: Effort normal. No respiratory distress.  Decreased lung sounds throughout with poor air movement  Abdominal: Soft. Bowel sounds are normal. She exhibits no distension. There is no tenderness. There is no guarding.  Musculoskeletal: Normal range of motion.  Lymphadenopathy:    She has no cervical adenopathy.  Neurological: She is alert and oriented to person, place, and time.  Skin: Skin is warm and dry.  Psychiatric: She has a normal mood and affect.  Nursing note and vitals reviewed.    ED Treatments / Results  Labs (all labs ordered are listed, but only abnormal results are displayed) Labs Reviewed  BASIC METABOLIC PANEL - Abnormal; Notable for the following:       Result Value   Potassium 3.1 (*)    Glucose, Bld 129 (*)    All other components within normal limits  CBC - Abnormal; Notable for the following:    WBC 12.0 (*)    Hemoglobin 11.7 (*)    All other components within normal limits  D-DIMER, QUANTITATIVE (NOT AT [facility]) - Abnormal; Notable for the following:    D-Dimer, Quant 0.79 (*)    All other components within normal limits  TROPONIN I  TROPONIN I  TROPONIN I  BASIC METABOLIC PANEL  CBC  I-STAT TROPONIN, ED    EKG  EKG Interpretation  Date/Time:  Friday December 24 2016 17:11:33 EDT Ventricular Rate:  116 PR Interval:  128 QRS Duration: 86 QT Interval:  344 QTC  Calculation: 478 R Axis:   62 Text Interpretation:  Sinus tachycardia Nonspecific ST and T wave abnormality Abnormal ECG Confirmed by Milton Ferguson [phone]) on 12/24/2016 7:03:21 PM       Radiology Dg Chest 2 View  Result Date: 12/24/2016 CLINICAL DATA:  Midsternal chest pain and shortness breath since yesterday. Dry cough for few days. Multiple heart and Lung conditions. EXAM: CHEST  2 VIEW COMPARISON:  Chest x-ray dated 10/06/2016. FINDINGS: Heart size and mediastinal contours are within normal limits. Coarse lung markings again noted throughout both lungs, stable, suggesting chronic interstitial lung disease. Lungs are hyperexpanded. No pleural effusion or pneumothorax seen. Status post vertebroplasties at multiple levels. No acute or suspicious osseous finding, although the diffuse osteopenia limits characterization of osseous detail. IMPRESSION: 1. No active cardiopulmonary disease. No evidence of pneumonia or pulmonary edema. 2. Hyperexpanded lungs indicating COPD. 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 Electronically Signed   By: Franki Cabot M.D.   On: 12/24/2016 18:38    Procedures Procedures (including critical care time)  Medications Ordered in ED Medications  clopidogrel (PLAVIX) tablet 75 mg (75 mg Oral Not Given 12/24/16 2220)  metoprolol tartrate (LOPRESSOR) tablet 12.5 mg (12.5 mg Oral Given 12/24/16 2218)  traMADol (ULTRAM) tablet 100 mg (not administered)  predniSONE (DELTASONE) tablet 15 mg (not administered)  isosorbide mononitrate (IMDUR) 24 hr tablet 30 mg (30 mg Oral Not Given 12/24/16 2221)  nortriptyline (PAMELOR) capsule 20 mg (20 mg Oral Given 12/24/16 2217)  tiotropium (SPIRIVA) inhalation capsule 18 mcg (not administered)  pravastatin (PRAVACHOL) tablet 20 mg (not administered)  vitamin  B-12 (CYANOCOBALAMIN) tablet 1,000 mcg (not administered)  polyethylene glycol (MIRALAX / GLYCOLAX) packet 17 g (not administered)  polyvinyl alcohol (LIQUIFILM  TEARS) 1.4 % ophthalmic solution 1 drop (not administered)  docusate sodium (COLACE) capsule 100 mg (not administered)  cholecalciferol (VITAMIN D) tablet 1,000 Units (not administered)  nitroGLYCERIN (NITROSTAT) SL tablet 0.4 mg (not administered)  acetaminophen (TYLENOL) tablet 650 mg (not administered)  ondansetron (ZOFRAN) injection 4 mg (not administered)  enoxaparin (LOVENOX) injection 40 mg (40 mg Subcutaneous Given 12/24/16 2217)  fentaNYL (SUBLIMAZE) injection 25-50 mcg (not administered)  ipratropium-albuterol (DUONEB) 0.5-2.5 (3) MG/3ML nebulizer solution 3 mL (3 mLs Nebulization Given 12/24/16 2029)  magnesium sulfate IVPB 1 g 100 mL (not administered)  potassium chloride 10 mEq in 100 mL IVPB (10 mEq Intravenous New Bag/Given 12/24/16 2219)  potassium chloride SA (K-DUR,KLOR-CON) CR tablet 20 mEq (20 mEq Oral Given 12/24/16 2222)     Initial Impression / Assessment and Plan / ED Course  I have reviewed the triage vital signs and the nursing notes.  Pertinent labs & imaging results that were available during my care of the patient were reviewed by me and considered in my medical decision making (see chart for details).   Pt presenting with acute worsening SOB and worsening CP with exertion x 2 days. Pain was improved with nitro. Physical exam showed poor air movement in all lung fields. Tachycardic, sats stable with 2%O2 per baseline. Will order EKG, CXR, basic labs, and troponin.    CXR negative for acute abnormality or infiltrate. Shows continued COPD. EKG showed tachycardia with non specific t wave changes. Elevated WBC at 12, and hypokalemia. Troponin negative. Considering acute worsening of sxs and significant cardiac and pulmonary history, will consult hospitalist for admission for evaluation and chest pain rue out. Case discussed with attending, and Dr. Roderic Palau agrees to plan.   Case discussed with Dr. Myna Hidalgo, and pt to be admitted.   Clinical Course as of Dec 24 2308  Fri Dec 24, 2016  2029 Called to the room for patient having increased work of breathing. Patient was transferring from bed to the toilet when she experienced acute worsening of her chest pain and feeling like she cannot breathe. Patient lips are gray, and minimal air movement heard throughout the lungs. Patient coached to take deep breaths, and oxygen saturations were in the mid-90s. Patient continually stating 'I am not breathing.'  With coaching on breathing, lips return to normal color, and patient was having less distress. Duoneb started, patient had improved symptoms. Patient sats increased to 100%.  [[place]]    Clinical Course User Index [[place]] Franchot Heidelberg, PA-C     Final Clinical Impressions(s) / ED Diagnoses   Final diagnoses:  Precordial pain  COPD exacerbation [facility])    New Prescriptions Current Discharge Medication List       Franchot Heidelberg, Hershal Coria 12/24/16 2310    Milton Ferguson, MD 12/24/16 2315

## 2016-12-24 NOTE — ED Notes (Signed)
Per provider who is at bedside,  Hold off on collecting addl labs.  Appears pt may be in distress at this time.

## 2016-12-24 NOTE — ED Notes (Signed)
While transferring to bedside commode pt began having increased SOB and became anxious. spo2 decreased to 85% on 2lpm. PT given PRN duoneb and symptoms resolved

## 2016-12-24 NOTE — ED Triage Notes (Addendum)
Pt arrives ems from ome wit central non radiating chest pain. Pt reports pain began yesterday at 1000 am on exertion and has been transient. Pt wears 2lpm [place] at home. PT lung sounds diminished with wheezes per ems. PT received duoneb in route. PT received 1 NTG SL wich took pain from 8/10 to 2/10 and 125mg  solumedrol

## 2016-12-24 NOTE — H&P (Signed)
History and Physical    Alexis Higgins YYT:[id number] DOB: April 18, 1941 DOA: 12/24/2016  PCP: Biagio Borg, MD   Patient coming from: Home  Chief Complaint: Chest pain   HPI: Alexis Higgins is a 76 y.o. female with medical history significant for COPD with chronic steroid and supplemental oxygen dependence, coronary artery disease with stent, and hypertension, now presenting to the emergency department for evaluation of chest pain. The patient reports that she has been suffering from a slight increase in her chronic cough for the past several days, then developed pain in the central chest yesterday morning. She describes this as severe, pressure-like, localized to the central chest, worse with cough or deep inspiration, and worse with exertion. She has not experienced similar symptoms previously. Denies any fevers or chills, denies recent long distance travel, and denies a swollen nor tender leg. No personal or family history of VTE. She called EMS and was treated with DuoNeb, 125 mg IV Solu-Medrol, and nitroglycerin. Pain improved from an "8/10" to "2/10" with this treatment.  ED Course: Upon arrival to the ED, patient is found to be afebrile, saturating well on her usual 2 L/m supplemental oxygen, tachycardic 110s, with vitals otherwise stable. EKG features a sinus tachycardia with rate 116 and nonspecific ST-T abnormalities diffusely. Chest x-ray is negative for acute cardiopulmonary disease but findings are consistent with COPD and possible interstitial lung disease/fibrosis. Chemistry panel reveals a potassium of 3.1. CBC is notable for slight leukocytosis to 12,000 and a normocytic anemia with hemoglobin of 11.7. Troponin is undetectable. Pain spontaneously resolved while in the ED. Patient has remained tachycardic in the ED, but otherwise stable and not in any acute distress. No chest pain at this time. She will be observed on the telemetry unit for ongoing evaluation and management of chest  pain, onset 36 hours ago, with both exertional and pleuritic components and negative initial troponin.  Review of Systems:  All other systems reviewed and apart from HPI, are negative.  Past Medical History:  Diagnosis Date  . Abnormal chest x-ray    03/2012: will need OP f/u.  Marland Kitchen Anginal pain ([place])   . ANXIETY 01/01/2007  . BURSITIS, RIGHT HIP 06/04/2009  . CAD (coronary artery disease)    a. BMS to LAD 2010. b. NSTEMI with DES to LAD for ISR 2011. c. Patent stent 03/2012/Imdur added.  . Cataract   . CHEST PAIN-PRECORDIAL 01/15/2009  . Colon polyps    H/o tubular adenoma of colon  . COPD 01/01/2007   a. Chronic resp failure on home O2.  Marland Kitchen DEPRESSION 01/01/2007  . Eczema 01/08/2011  . GROIN PAIN 06/20/2008  . Headache(784.0) 01/01/2007  . Hemorrhoid   . HYPERTENSION 01/01/2007  . Impaired glucose tolerance 01/07/2011  . LOW BACK PAIN 01/01/2007  . Muscle weakness (generalized) 06/04/2009  . On home oxygen therapy    "2L; 24/7" (09/10/2016)  . OSTEOARTHRITIS, HIP 07/01/2008  . OSTEOPOROSIS 01/01/2007  . Pneumonia 1998  . Rosacea 01/08/2011  . Shortness of breath   . SYNCOPE 01/01/2007  . Thoracic compression fracture ([place])   . TIA (transient ischemic attack)   . TRANSIENT ISCHEMIC ATTACK, HX OF 01/01/2007    Past Surgical History:  Procedure Laterality Date  . ABDOMINAL HYSTERECTOMY    . APPENDECTOMY    . BACK SURGERY    . BREAST ENHANCEMENT SURGERY    . COLONOSCOPY  01/25/2002   tubular adenoma,hemorrhoids, hyperplastic  colon polyps  . COLONOSCOPY  02/17/2005   hemorrhoids  .  CORONARY ANGIOPLASTY    . CORONARY STENT PLACEMENT    . HIP ARTHROPLASTY Left 10/01/2013   Procedure: ARTHROPLASTY UNIPOLAR   HIP;  Surgeon: Marianna Payment, MD;  Location: [facility];  Service: Orthopedics;  Laterality: Left;  . HIP SURGERY Left    DR XU     PROXIMAL NECK   . IR GENERIC HISTORICAL  02/19/2016   IR VERTEBROPLASTY CERV/THOR BX INC UNI/BIL INC/INJECT/IMAGING 02/19/2016 Luanne Bras, MD  [facility]  . IR GENERIC HISTORICAL  07/15/2016   IR KYPHO THORACIC WITH BONE BIOPSY 07/15/2016 Luanne Bras, MD [facility]  . IR KYPHO EA ADDL LEVEL THORACIC OR LUMBAR  09/14/2016  . IR RADIOLOGIST EVAL & MGMT  09/22/2016  . IR VERTEBROPLASTY CERV/THOR BX INC UNI/BIL INC/INJECT/IMAGING  10/08/2016  . IR VERTEBROPLASTY CERV/THOR BX INC UNI/BIL INC/INJECT/IMAGING  11/16/2016  . LEFT HEART CATHETERIZATION WITH CORONARY ANGIOGRAM N/A 04/13/2012   Procedure: LEFT HEART CATHETERIZATION WITH CORONARY ANGIOGRAM;  Surgeon: Burnell Blanks, MD;  Location: [facility] CATH LAB;  Service: Cardiovascular;  Laterality: N/A;  . OOPHORECTOMY     one ovary  . s/p bilat cataract  2010  . sp lumbar disc surgury     [name]     reports that she quit smoking about 11 years ago. Her smoking use included Cigarettes. She has a 76.50 pack-year smoking history. She has never used smokeless tobacco. She reports that she does not drink alcohol or use drugs.  Allergies  Allergen Reactions  . Aspirin Other (See Comments)     cns bleed risk  . Codeine Anaphylaxis and Rash  . Other Other (See Comments)    Stiolto - severe reaction - caused inability to breath    Family History  Problem Relation Age of Onset  . Osteoporosis Mother   . Heart disease Sister   . Emphysema Sister   . Seizures Sister        epilepsy  . Cardiomyopathy Sister   . Heart attack Sister      Prior to Admission medications   Medication Sig Start Date End Date Taking? Authorizing Provider  acetaminophen (TYLENOL) 500 MG tablet Take 1,000 mg by mouth as needed for moderate pain or headache.    Yes [provider]  ascorbic acid (VITAMIN C) 1000 MG tablet Take 1,000 mg by mouth daily.   Yes [provider]  Cholecalciferol (VITAMIN D) 1000 UNITS capsule Take 1,000 Units by mouth every evening.    Yes [provider]  clopidogrel (PLAVIX) 75 MG tablet Take 1 tablet (75 mg total) by mouth daily.  12/15/16  Yes Burnell Blanks, MD  docusate sodium (COLACE) 100 MG capsule Take 100 mg by mouth daily as needed for mild constipation.    Yes [provider]  isosorbide mononitrate (IMDUR) 30 MG 24 hr tablet TAKE 0.5 TABLETS (15 MG TOTAL) BY MOUTH DAILY. 11/29/16  Yes Burnell Blanks, MD  Lidocaine HCl (PAIN RELIEF ROLL-ON EX) Apply 1 application topically 2 (two) times daily as needed (PAIN).    Yes [provider]  lovastatin (MEVACOR) 20 MG tablet TAKE 1 TABLET BY MOUTH AT BEDTIME Patient taking differently: TAKE 20 MG BY MOUTH AT BEDTIME 03/10/16  Yes Biagio Borg, MD  metoprolol tartrate (LOPRESSOR) 25 MG tablet Take 0.5 tablets (12.5 mg total) by mouth 2 (two) times daily. 12/15/16  Yes Burnell Blanks, MD  nitroGLYCERIN (NITROSTAT) 0.4 MG SL tablet Place 1 tablet (0.4 mg total) under the tongue every 5 (  five) minutes as needed for chest pain (up to 3 doses). 04/13/12  Yes Dunn, Dayna N, PA-C  nortriptyline (PAMELOR) 10 MG capsule Take 20 mg by mouth at bedtime.   Yes [provider]  ondansetron (ZOFRAN) 4 MG tablet Take 1 tablet (4 mg total) by mouth every 8 (eight) hours as needed for nausea or vomiting. Patient taking differently: Take 4 mg by mouth 2 (two) times daily.  01/06/16  Yes Mackuen, Courteney Lyn, MD  OXYGEN Inhale 2 L into the lungs continuous.    Yes [provider]  polyethylene glycol (MIRALAX / GLYCOLAX) packet Take 17 g by mouth daily as needed for mild constipation.   Yes [provider]  polyvinyl alcohol (LIQUIFILM TEARS) 1.4 % ophthalmic solution Place 1 drop into both eyes daily as needed for dry eyes.    Yes [provider]  predniSONE (DELTASONE) 10 MG tablet TAKE 1.5 TABLETS (15 MG TOTAL) BY MOUTH DAILY WITH BREAKFAST. 11/29/16  Yes Byrum, Rose Fillers, MD  PROAIR HFA 108 [phone] Base) MCG/ACT inhaler INHALE 2 PUFFS INTO THE LUNGS EVERY 6 (SIX) HOURS AS NEEDED FOR WHEEZING OR SHORTNESS OF BREATH.  07/20/16  Yes Biagio Borg, MD  tiotropium (SPIRIVA HANDIHALER) 18 MCG inhalation capsule Place 1 capsule (18 mcg total) into inhaler and inhale daily at 6 (six) AM. 05/26/16  Yes Biagio Borg, MD  traMADol (ULTRAM) 50 MG tablet Take 100 mg by mouth every 6 (six) hours as needed for moderate pain.    Yes [provider]  vitamin B-12 (CYANOCOBALAMIN) 1000 MCG tablet Take 1,000 mcg by mouth daily.   Yes [provider]  furosemide (LASIX) 20 MG tablet Take 1 tablet (20 mg total) by mouth daily. 09/29/16   Biagio Borg, MD    Physical Exam: Vitals:   12/24/16 1659 12/24/16 1705 12/24/16 1737 12/24/16 1845  BP:   139/88 (!) 147/80  Pulse:   (!) 110 (!) 110  Resp:   18   Temp:   98.4 F (36.9 C)   TempSrc:   Oral   SpO2: 98%  98% 97%  Weight:  60.3 kg (133 lb)        Constitutional: NAD, calm, comfortable Eyes: PERTLA, lids and conjunctivae normal ENMT: Mucous membranes are moist. Posterior pharynx clear of any exudate or lesions.   Neck: normal, supple, no masses, no thyromegaly Respiratory: Markedly diminished breath sounds bilaterally. No wheeze, no rhonchi. No accessory muscle use.  Cardiovascular: Rate ~110 and regular. No extremity edema. No significant JVD. Abdomen: No distension, no tenderness, no masses palpated. Bowel sounds normal.  Musculoskeletal: no clubbing / cyanosis. No joint deformity upper and lower extremities.   Skin: Scattered ecchymoses and skin tears. Warm, dry, well-perfused. Neurologic: CN 2-12 grossly intact. Sensation intact, DTR normal. Strength 5/5 in all 4 limbs.  Psychiatric: Alert and oriented x 3. Pleasant, cooperative.     Labs on Admission: I have personally reviewed following labs and imaging studies  CBC:  Recent Labs Lab 12/24/16 1729  WBC 12.0*  HGB 11.7*  HCT 38.2  MCV 91.2  PLT 671   Basic Metabolic Panel:  Recent Labs Lab 12/24/16 1729  NA 142  K 3.1*  CL 101  CO2 32  GLUCOSE 129*  BUN 7  CREATININE  0.83  CALCIUM 9.3   GFR: Estimated Creatinine Clearance: 54.8 mL/min (by C-G formula based on SCr of 0.83 mg/dL). Liver Function Tests: No results for input(s): AST, ALT, ALKPHOS, BILITOT, PROT, ALBUMIN in  the last 168 hours. No results for input(s): LIPASE, AMYLASE in the last 168 hours. No results for input(s): AMMONIA in the last 168 hours. Coagulation Profile: No results for input(s): INR, PROTIME in the last 168 hours. Cardiac Enzymes: No results for input(s): CKTOTAL, CKMB, CKMBINDEX, TROPONINI in the last 168 hours. BNP (last 3 results) No results for input(s): PROBNP in the last 8760 hours. HbA1C: No results for input(s): HGBA1C in the last 72 hours. CBG: No results for input(s): GLUCAP in the last 168 hours. Lipid Profile: No results for input(s): CHOL, HDL, LDLCALC, TRIG, CHOLHDL, LDLDIRECT in the last 72 hours. Thyroid Function Tests: No results for input(s): TSH, T4TOTAL, FREET4, T3FREE, THYROIDAB in the last 72 hours. Anemia Panel: No results for input(s): VITAMINB12, FOLATE, FERRITIN, TIBC, IRON, RETICCTPCT in the last 72 hours. Urine analysis:    Component Value Date/Time   COLORURINE YELLOW 11/13/2016 1120   APPEARANCEUR HAZY (A) 11/13/2016 1120   LABSPEC 1.018 11/13/2016 1120   PHURINE 6.0 11/13/2016 1120   GLUCOSEU NEGATIVE 11/13/2016 1120   GLUCOSEU NEGATIVE 08/01/2015 1050   HGBUR SMALL (A) 11/13/2016 1120   BILIRUBINUR NEGATIVE 11/13/2016 1120   KETONESUR NEGATIVE 11/13/2016 1120   PROTEINUR NEGATIVE 11/13/2016 1120   UROBILINOGEN 0.2 08/01/2015 1050   NITRITE NEGATIVE 11/13/2016 1120   LEUKOCYTESUR TRACE (A) 11/13/2016 1120   Sepsis Labs: @LABRCNTIP (procalcitonin:4,lacticidven:4) )No results found for this or any previous visit (from the past 240 hour(s)).   Radiological Exams on Admission: Dg Chest 2 View  Result Date: 12/24/2016 CLINICAL DATA:  Midsternal chest pain and shortness breath since yesterday. Dry cough for few days. Multiple heart and  Lung conditions. EXAM: CHEST  2 VIEW COMPARISON:  Chest x-ray dated 10/06/2016. FINDINGS: Heart size and mediastinal contours are within normal limits. Coarse lung markings again noted throughout both lungs, stable, suggesting chronic interstitial lung disease. Lungs are hyperexpanded. No pleural effusion or pneumothorax seen. Status post vertebroplasties at multiple levels. No acute or suspicious osseous finding, although the diffuse osteopenia limits characterization of osseous detail. IMPRESSION: 1. No active cardiopulmonary disease. No evidence of pneumonia or pulmonary edema. 2. Hyperexpanded lungs indicating COPD. 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 Electronically Signed   By: Franki Cabot M.D.   On: 12/24/2016 18:38    EKG: Independently reviewed. Sinus tachycardia (rate 116), non-specific ST-T abnormality diffuse leads.   Assessment/Plan  1. Chest pain, CAD  - Pt presents with ~36 hrs of chest pain, waxing and waning; worse with exertion, cough, or deep breath - Nearly resolved with a NTG en route, then completely resolved while in ED  - Initial EKG with non-specific ST-T abnormality in diffuse leads, CXR consistent with COPD/ILD, but no acute disease, and initial troponin is undetectable  - She is essentially chair-bound d/t her severe chronic lung disease, and with her tachycardia and pleuritic component to the chest pain, she is at intermediate probability for PE and will be ruled-out with d-dimer  - Plan to continue cardiac monitoring, obtain serial troponin measurements, repeat EKG, check CTA chest if d-dimer positive, continue Plavix, continue statin, and continue Lopressor   2. COPD, chronic hypoxic respiratory failure  - Dependent on 2 Lpm supplemental O2 and 15 mg daily prednisone, follows with pulm and attempts at reducing steroid have been unsuccessful  - Reports slight worsening in her cough last few days - Treated with DuoNeb and 125 mg IV  Solu-Medrol en route  - No wheezing or distress on exam  - Plan to continue Spiriva, prednisone  15 mg qD, supplemental O2, prn nebs   3. Hypertension  - BP at goal, continue Lopressor   4. Hypokalemia  - Serum potassium is 3.1 on admission - Possibly secondary to beta-agonist neb en route   - Treated with 20 mEq IV and 20 mEq oral potassium, 1 g IV magnesium    DVT prophylaxis: sq Lovenox  Code Status: Full  Family Communication: Husband updated at bedside Disposition Plan: Observe on telemetry Consults called: None Admission status: Observation    Vianne Bulls, MD [facility] Pager [phone]  If 7PM-7AM, please contact night-coverage [email or web address] Password [facility]  12/24/2016, 7:48 PM

## 2016-12-25 ENCOUNTER — Encounter (HOSPITAL_COMMUNITY): Payer: Self-pay

## 2016-12-25 ENCOUNTER — Observation Stay (HOSPITAL_COMMUNITY): Payer: Medicare Other

## 2016-12-25 DIAGNOSIS — I25110 Atherosclerotic heart disease of native coronary artery with unstable angina pectoris: Secondary | ICD-10-CM | POA: Diagnosis not present

## 2016-12-25 DIAGNOSIS — J9611 Chronic respiratory failure with hypoxia: Secondary | ICD-10-CM | POA: Diagnosis not present

## 2016-12-25 DIAGNOSIS — R079 Chest pain, unspecified: Secondary | ICD-10-CM | POA: Diagnosis not present

## 2016-12-25 DIAGNOSIS — R7989 Other specified abnormal findings of blood chemistry: Secondary | ICD-10-CM | POA: Diagnosis not present

## 2016-12-25 DIAGNOSIS — I208 Other forms of angina pectoris: Secondary | ICD-10-CM

## 2016-12-25 DIAGNOSIS — I259 Chronic ischemic heart disease, unspecified: Secondary | ICD-10-CM | POA: Diagnosis not present

## 2016-12-25 DIAGNOSIS — F329 Major depressive disorder, single episode, unspecified: Secondary | ICD-10-CM | POA: Diagnosis not present

## 2016-12-25 DIAGNOSIS — R0789 Other chest pain: Secondary | ICD-10-CM | POA: Diagnosis not present

## 2016-12-25 DIAGNOSIS — I10 Essential (primary) hypertension: Secondary | ICD-10-CM | POA: Diagnosis not present

## 2016-12-25 LAB — BASIC METABOLIC PANEL
ANION GAP: 11 (ref 5–15)
BUN: 6 mg/dL (ref 6–20)
CHLORIDE: 103 mmol/L (ref 101–111)
CO2: 29 mmol/L (ref 22–32)
Calcium: 9 mg/dL (ref 8.9–10.3)
Creatinine, Ser: 0.65 mg/dL (ref 0.44–1.00)
GFR calc Af Amer: >60 mL/min (ref >60)
GFR calc non Af Amer: >60 mL/min (ref >60)
Glucose, Bld: 121 mg/dL — ABNORMAL HIGH (ref 65–99)
Potassium: 4.4 mmol/L (ref 3.5–5.1)
Sodium: 143 mmol/L (ref 135–145)

## 2016-12-25 LAB — CBC
HCT: 39.1 % (ref 36.0–46.0)
HEMOGLOBIN: 12.1 g/dL (ref 12.0–15.0)
MCH: 27.9 pg (ref 26.0–34.0)
MCHC: 30.9 g/dL (ref 30.0–36.0)
MCV: 90.3 fL (ref 78.0–100.0)
Platelets: 283 10*3/uL (ref 150–400)
RBC: 4.33 MIL/uL (ref 3.87–5.11)
RDW: 13.6 % (ref 11.5–15.5)
WBC: 12.4 10*3/uL — ABNORMAL HIGH (ref 4.0–10.5)

## 2016-12-25 LAB — TROPONIN I
Troponin I: 0.07 ng/mL (ref <0.03)
Troponin I: 0.11 ng/mL (ref <0.03)

## 2016-12-25 MED ORDER — METOPROLOL TARTRATE 25 MG PO TABS
25.0000 mg | ORAL_TABLET | Freq: Two times a day (BID) | ORAL | Status: DC
  Administered 2016-12-25 – 2016-12-26 (×2): 25 mg via ORAL
  Filled 2016-12-25 (×2): qty 1

## 2016-12-25 MED ORDER — IOPAMIDOL (ISOVUE-300) INJECTION 61%
INTRAVENOUS | Status: AC
  Filled 2016-12-25: qty 100

## 2016-12-25 MED ORDER — IOPAMIDOL (ISOVUE-370) INJECTION 76%
INTRAVENOUS | Status: AC
  Administered 2016-12-25: 100 mL
  Filled 2016-12-25: qty 100

## 2016-12-25 NOTE — Consult Note (Signed)
Cardiology Consultation:   Patient ID: Alexis Higgins; [id number]; November 11, 1940   Admit date: 12/24/2016 Date of Consult: 12/25/2016  Primary Care Provider: Biagio Borg, MD Primary Cardiologist: [facility] Primary Electrophysiologist:  none   Patient Profile:   Alexis Higgins is a 76 y.o. female with a hx of endstage COPD who is being seen today for the evaluation of chest pressure assoicated with palpitations at the request of Dr. Wendee Beavers.  History of Present Illness:   Alexis Higgins is a 76 yo woman with known CAD, s/p stent in 2011. She has a long h/o oxygen and steroid dependent COPD and is on multiple meds. She has been unable to wean off of steroids for years. The patient has not had syncope. Yesterday she began to experience episodes of palpitations associate with her heart beating hard. The episodes came and went but then got worse prompting her to seek medical attention. She has had minimal troponin elevations and her ECG is normal. The patient denies dietary or medical non-compliance. Her tele demonstrates normal sinus. She has chronic skin changes from her steroid use.  Past Medical History:  Diagnosis Date  . Abnormal chest x-ray    03/2012: will need OP f/u.  Marland Kitchen Anginal pain ([place])   . ANXIETY 01/01/2007  . BURSITIS, RIGHT HIP 06/04/2009  . CAD (coronary artery disease)    a. BMS to LAD 2010. b. NSTEMI with DES to LAD for ISR 2011. c. Patent stent 03/2012/Imdur added.  . Cataract   . CHEST PAIN-PRECORDIAL 01/15/2009  . Colon polyps    H/o tubular adenoma of colon  . COPD 01/01/2007   a. Chronic resp failure on home O2.  Marland Kitchen DEPRESSION 01/01/2007  . Eczema 01/08/2011  . GROIN PAIN 06/20/2008  . Headache(784.0) 01/01/2007  . Hemorrhoid   . HYPERTENSION 01/01/2007  . Impaired glucose tolerance 01/07/2011  . LOW BACK PAIN 01/01/2007  . Muscle weakness (generalized) 06/04/2009  . On home oxygen therapy    "2L; 24/7" (09/10/2016)  . OSTEOARTHRITIS, HIP 07/01/2008  . OSTEOPOROSIS 01/01/2007  .  Pneumonia 1998  . Rosacea 01/08/2011  . Shortness of breath   . SYNCOPE 01/01/2007  . Thoracic compression fracture ([place])   . TIA (transient ischemic attack)   . TRANSIENT ISCHEMIC ATTACK, HX OF 01/01/2007    Past Surgical History:  Procedure Laterality Date  . ABDOMINAL HYSTERECTOMY    . APPENDECTOMY    . BACK SURGERY    . BREAST ENHANCEMENT SURGERY    . COLONOSCOPY  01/25/2002   tubular adenoma,hemorrhoids, hyperplastic  colon polyps  . COLONOSCOPY  02/17/2005   hemorrhoids  . CORONARY ANGIOPLASTY    . CORONARY STENT PLACEMENT    . HIP ARTHROPLASTY Left 10/01/2013   Procedure: ARTHROPLASTY UNIPOLAR   HIP;  Surgeon: Marianna Payment, MD;  Location: [facility];  Service: Orthopedics;  Laterality: Left;  . HIP SURGERY Left    DR XU     PROXIMAL NECK   . IR GENERIC HISTORICAL  02/19/2016   IR VERTEBROPLASTY CERV/THOR BX INC UNI/BIL INC/INJECT/IMAGING 02/19/2016 Luanne Bras, MD [facility]INTERV RAD  . IR GENERIC HISTORICAL  07/15/2016   IR KYPHO THORACIC WITH BONE BIOPSY 07/15/2016 Luanne Bras, MD [facility]  . IR KYPHO EA ADDL LEVEL THORACIC OR LUMBAR  09/14/2016  . IR RADIOLOGIST EVAL & MGMT  09/22/2016  . IR VERTEBROPLASTY CERV/THOR BX INC UNI/BIL INC/INJECT/IMAGING  10/08/2016  . IR VERTEBROPLASTY CERV/THOR BX INC UNI/BIL INC/INJECT/IMAGING  11/16/2016  . LEFT HEART CATHETERIZATION WITH  CORONARY ANGIOGRAM N/A 04/13/2012   Procedure: LEFT HEART CATHETERIZATION WITH CORONARY ANGIOGRAM;  Surgeon: Burnell Blanks, MD;  Location: [facility] CATH LAB;  Service: Cardiovascular;  Laterality: N/A;  . OOPHORECTOMY     one ovary  . s/p bilat cataract  2010  . sp lumbar disc surgury     [name]     Inpatient Medications: Scheduled Meds: . cholecalciferol  1,000 Units Oral QPM  . clopidogrel  75 mg Oral Daily  . enoxaparin (LOVENOX) injection  40 mg Subcutaneous Q24H  . isosorbide mononitrate  30 mg Oral Daily  . metoprolol tartrate  25 mg Oral BID  . nortriptyline  20 mg Oral QHS  .  pravastatin  20 mg Oral q1800  . predniSONE  15 mg Oral Q breakfast  . tiotropium  18 mcg Inhalation Q0600  . vitamin B-12  1,000 mcg Oral Daily   Continuous Infusions:  PRN Meds: acetaminophen, docusate sodium, fentaNYL (SUBLIMAZE) injection, ipratropium-albuterol, nitroGLYCERIN, ondansetron (ZOFRAN) IV, polyethylene glycol, polyvinyl alcohol, traMADol  Allergies:    Allergies  Allergen Reactions  . Aspirin Other (See Comments)     cns bleed risk  . Codeine Anaphylaxis and Rash  . Other Other (See Comments)    Stiolto - severe reaction - caused inability to breath    Social History:   Social History   Social History  . Marital status: Married    Spouse name: N/A  . Number of children: 4  . Years of education: N/A   Occupational History  . disabled back since 2003    Social History Main Topics  . Smoking status: Former Smoker    Packs/day: 1.50    Years: 51.00    Types: Cigarettes    Quit date: 07/24/2005  . Smokeless tobacco: Never Used  . Alcohol use No  . Drug use: No  . Sexual activity: Not on file   Other Topics Concern  . Not on file   Social History Narrative  . No narrative on file    Family History:   Family History  Problem Relation Age of Onset  . Osteoporosis Mother   . Heart disease Sister   . Emphysema Sister   . Seizures Sister        epilepsy  . Cardiomyopathy Sister   . Heart attack Sister      ROS:  Please see the history of present illness.  ROS  All other ROS reviewed and negative.     Physical Exam/Data:   Vitals:   12/25/16 0735 12/25/16 1011 12/25/16 1212 12/25/16 1300  BP:  (!) 158/81 (!) 155/88 139/86  Pulse:  97 100 93  Resp:   18 18  Temp:   98.7 F (37.1 C)   TempSrc:   Oral   SpO2: 98%  95% 99%  Weight:      Height:        Intake/Output Summary (Last 24 hours) at 12/25/16 1559 Last data filed at 12/25/16 1400  Gross per 24 hour  Intake              560 ml  Output             1500 ml  Net             -940  ml   Filed Weights   12/24/16 1705 12/24/16 2113 12/25/16 0421  Weight: 133 lb (60.3 kg) 130 lb 12.8 oz (59.3 kg) 130 lb 12.8 oz (59.3 kg)   Body mass index  is 21.11 kg/m.  General:  Chronically ill appearing but in no acute distress HEENT: normal Lymph: no adenopathy Neck: 7 cm JVD Endocrine:  No thryomegaly Vascular: No carotid bruits; FA pulses 2+ bilaterally without bruits  Cardiac:  normal S1, S2; RRR; no murmur  Lungs: scattered wheezes and rales and decreased breath sounds throughout Abd: soft, nontender, no hepatomegaly scaphoid Ext: no edema Musculoskeletal:  No deformities, BUE and BLE strength normal and equal Skin: warm and dry with multiple ecchymotic lesions. Neuro:  CNs 2-12 intact, no focal abnormalities noted Psych:  Normal affect   EKG:  The EKG was personally reviewed and demonstrates:  nsr Telemetry:  Telemetry was personally reviewed and demonstrates:  nsr  Relevant CV Studies: none  Laboratory Data:  Chemistry Recent Labs Lab 12/24/16 1729 12/25/16 0733  NA 142 143  K 3.1* 4.4  CL 101 103  CO2 32 29  GLUCOSE 129* 121*  BUN 7 6  CREATININE 0.83 0.65  CALCIUM 9.3 9.0  GFRNONAA >60 >60  GFRAA >60 >60  ANIONGAP 9 11    No results for input(s): PROT, ALBUMIN, AST, ALT, ALKPHOS, BILITOT in the last 168 hours. Hematology Recent Labs Lab 12/24/16 1729 12/25/16 0733  WBC 12.0* 12.4*  RBC 4.19 4.33  HGB 11.7* 12.1  HCT 38.2 39.1  MCV 91.2 90.3  MCH 27.9 27.9  MCHC 30.6 30.9  RDW 13.5 13.6  PLT 253 283   Cardiac Enzymes Recent Labs Lab 12/24/16 2036 12/25/16 0203 12/25/16 0733  TROPONINI <0.03 0.07* 0.11*    Recent Labs Lab 12/24/16 1741  TROPIPOC 0.00    BNPNo results for input(s): BNP, PROBNP in the last 168 hours.  DDimer  Recent Labs Lab 12/24/16 2036  DDIMER 0.79*    Radiology/Studies:  Dg Chest 2 View  Result Date: 12/24/2016 CLINICAL DATA:  Midsternal chest pain and shortness breath since yesterday. Dry cough for  few days. Multiple heart and Lung conditions. EXAM: CHEST  2 VIEW COMPARISON:  Chest x-ray dated 10/06/2016. FINDINGS: Heart size and mediastinal contours are within normal limits. Coarse lung markings again noted throughout both lungs, stable, suggesting chronic interstitial lung disease. Lungs are hyperexpanded. No pleural effusion or pneumothorax seen. Status post vertebroplasties at multiple levels. No acute or suspicious osseous finding, although the diffuse osteopenia limits characterization of osseous detail. IMPRESSION: 1. No active cardiopulmonary disease. No evidence of pneumonia or pulmonary edema. 2. Hyperexpanded lungs indicating COPD. 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 Electronically Signed   By: Franki Cabot M.D.   On: 12/24/2016 18:38   Ct Angio Chest Pe W Or Wo Contrast  Result Date: 12/25/2016 CLINICAL DATA:  Acute onset of shortness of breath. Elevated D-dimer. Initial encounter. EXAM: CT ANGIOGRAPHY CHEST WITH CONTRAST TECHNIQUE: Multidetector CT imaging of the chest was performed using the standard protocol during bolus administration of intravenous contrast. Multiplanar CT image reconstructions and MIPs were obtained to evaluate the vascular anatomy. CONTRAST:  70 mL of Isovue 370 IV contrast COMPARISON:  CTA of the chest performed 09/10/2016 FINDINGS: Cardiovascular:  There is no evidence of pulmonary embolus. Prominence of the bilateral pulmonary arteries may reflect pulmonary arterial hypertension. The heart remains normal in size. Minimal calcification is seen along the descending thoracic aorta. The great vessels are within normal limits. Mediastinum/Nodes: No mediastinal lymphadenopathy is seen. No pericardial effusion is identified. The thyroid gland is unremarkable in appearance. No axillary lymphadenopathy is seen. Calcification is noted about the patient's breast implants. Lungs/Pleura: Bilateral emphysema is noted. No focal consolidation,  pleural  effusion or pneumothorax is seen. No dominant mass is identified. Upper Abdomen: The visualized portions of the liver and spleen are unremarkable. The visualized portions of the pancreas, adrenal glands and kidneys are within normal limits. Scattered calcification is seen along the proximal abdominal aorta. Musculoskeletal: No acute osseous abnormalities are identified. The patient is status post vertebroplasty at T5, T6, T7, T10 and T11. The visualized musculature is unremarkable in appearance. Review of the MIP images confirms the above findings. IMPRESSION: 1. No evidence of pulmonary embolus. 2. Bilateral emphysema noted. 3. Prominence of the pulmonary arteries may reflect pulmonary arterial hypertension. Would correlate clinically. 4. Scattered aortic atherosclerosis. Electronically Signed   By: Garald Balding M.D.   On: 12/25/2016 04:07    Assessment and Plan:   1. Chest pressure - I suspect her symptoms are secondary to her heart going out of rhythm. She states that the pressure in her chest is completely different from when she had her prior MI. Her ECG is normal and her troponin is minimally elevated. I would not pursue additional ischemic evaluation.  2. Palpitations - a 30 day monitor a consideration but her multiple comorbidities and terrible skin make wearing a monitor problematic. I would suggest uptitration of her beta blocker to 25 mg twice daily.  3. COPD - she has endstage lung disease and I suspect her one year survival is very low   Signed, Cristopher Peru, MD  12/25/2016 3:59 PM

## 2016-12-25 NOTE — Progress Notes (Signed)
PROGRESS NOTE    Alexis Higgins  WER:[id number] DOB: April 16, 1941 DOA: 12/24/2016 PCP: Biagio Borg, MD    Brief Narrative:   76 y.o. female with medical history significant for COPD with chronic steroid and supplemental oxygen dependence, coronary artery disease with stent, and hypertension, now presenting to the emergency department for evaluation of chest pain. The patient reports that she has been suffering from a slight increase in her chronic cough for the past several days, then developed pain in the central chest yesterday morning.   Assessment & Plan:   Principal Problem:   Chest pain - cardiology consulted. And chest pain presumed to be secondary to heart going out of rhythm. Plan is to increase beta blocker to 25 mg by mouth twice a day. - Will monitor overnight and reassess next a.m.  Active Problems:   Depression - Stable currently patient on Pamelor will continue    Essential hypertension - Patient is on Imdur and beta blocker    CAD, NATIVE VESSEL - Continue Plavix    COPD (chronic obstructive pulmonary disease) with emphysema (HCC) - Compensated currently patient on Spiriva, continue DuoNeb when necessary   DVT prophylaxis: Lovenox Code Status: Full Family Communication: Discussed with spouse at bedside Disposition Plan: Reassess next a.m. after medication change by specialist   Consultants:   Cardiology   Procedures: None   Antimicrobials: None   Subjective: Patient has no new complaints. Has had chest discomfort on and off today.  Objective: Vitals:   12/25/16 0735 12/25/16 1011 12/25/16 1212 12/25/16 1300  BP:  (!) 158/81 (!) 155/88 139/86  Pulse:  97 100 93  Resp:   18 18  Temp:   98.7 F (37.1 C)   TempSrc:   Oral   SpO2: 98%  95% 99%  Weight:      Height:        Intake/Output Summary (Last 24 hours) at 12/25/16 1642 Last data filed at 12/25/16 1400  Gross per 24 hour  Intake              560 ml  Output             1500 ml    Net             -940 ml   Filed Weights   12/24/16 1705 12/24/16 2113 12/25/16 0421  Weight: 60.3 kg (133 lb) 59.3 kg (130 lb 12.8 oz) 59.3 kg (130 lb 12.8 oz)    Examination:  General exam: Appears calm and comfortable , In no acute distress Respiratory system: Nasal cannula in place, equal chest rise, prolonged expiratory phase,. Cardiovascular system: S1 & S2 heard, RRR. No JVD, murmurs, or gallops Gastrointestinal system: Abdomen is nondistended, soft and nontender. No organomegaly or masses felt. Normal bowel sounds heard. Central nervous system: Alert and oriented. No focal neurological deficits. Extremities: Symmetric 5 x 5 power. Skin: No rashes, lesions or ulcers, on limited exam Psychiatry: Mood & affect appropriate.     Data Reviewed: I have personally reviewed following labs and imaging studies  CBC:  Recent Labs Lab 12/24/16 1729 12/25/16 0733  WBC 12.0* 12.4*  HGB 11.7* 12.1  HCT 38.2 39.1  MCV 91.2 90.3  PLT 253 195   Basic Metabolic Panel:  Recent Labs Lab 12/24/16 1729 12/25/16 0733  NA 142 143  K 3.1* 4.4  CL 101 103  CO2 32 29  GLUCOSE 129* 121*  BUN 7 6  CREATININE 0.83 0.65  CALCIUM 9.3 9.0  GFR: Estimated Creatinine Clearance: 56.9 mL/min (by C-G formula based on SCr of 0.65 mg/dL). Liver Function Tests: No results for input(s): AST, ALT, ALKPHOS, BILITOT, PROT, ALBUMIN in the last 168 hours. No results for input(s): LIPASE, AMYLASE in the last 168 hours. No results for input(s): AMMONIA in the last 168 hours. Coagulation Profile: No results for input(s): INR, PROTIME in the last 168 hours. Cardiac Enzymes:  Recent Labs Lab 12/24/16 2036 12/25/16 0203 12/25/16 0733  TROPONINI <0.03 0.07* 0.11*   BNP (last 3 results) No results for input(s): PROBNP in the last 8760 hours. HbA1C: No results for input(s): HGBA1C in the last 72 hours. CBG: No results for input(s): GLUCAP in the last 168 hours. Lipid Profile: No results for  input(s): CHOL, HDL, LDLCALC, TRIG, CHOLHDL, LDLDIRECT in the last 72 hours. Thyroid Function Tests: No results for input(s): TSH, T4TOTAL, FREET4, T3FREE, THYROIDAB in the last 72 hours. Anemia Panel: No results for input(s): VITAMINB12, FOLATE, FERRITIN, TIBC, IRON, RETICCTPCT in the last 72 hours. Sepsis Labs: No results for input(s): PROCALCITON, LATICACIDVEN in the last 168 hours.  No results found for this or any previous visit (from the past 240 hour(s)).       Radiology Studies: Dg Chest 2 View  Result Date: 12/24/2016 CLINICAL DATA:  Midsternal chest pain and shortness breath since yesterday. Dry cough for few days. Multiple heart and Lung conditions. EXAM: CHEST  2 VIEW COMPARISON:  Chest x-ray dated 10/06/2016. FINDINGS: Heart size and mediastinal contours are within normal limits. Coarse lung markings again noted throughout both lungs, stable, suggesting chronic interstitial lung disease. Lungs are hyperexpanded. No pleural effusion or pneumothorax seen. Status post vertebroplasties at multiple levels. No acute or suspicious osseous finding, although the diffuse osteopenia limits characterization of osseous detail. IMPRESSION: 1. No active cardiopulmonary disease. No evidence of pneumonia or pulmonary edema. 2. Hyperexpanded lungs indicating COPD. 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 Electronically Signed   By: Franki Cabot M.D.   On: 12/24/2016 18:38   Ct Angio Chest Pe W Or Wo Contrast  Result Date: 12/25/2016 CLINICAL DATA:  Acute onset of shortness of breath. Elevated D-dimer. Initial encounter. EXAM: CT ANGIOGRAPHY CHEST WITH CONTRAST TECHNIQUE: Multidetector CT imaging of the chest was performed using the standard protocol during bolus administration of intravenous contrast. Multiplanar CT image reconstructions and MIPs were obtained to evaluate the vascular anatomy. CONTRAST:  70 mL of Isovue 370 IV contrast COMPARISON:  CTA of the chest performed  09/10/2016 FINDINGS: Cardiovascular:  There is no evidence of pulmonary embolus. Prominence of the bilateral pulmonary arteries may reflect pulmonary arterial hypertension. The heart remains normal in size. Minimal calcification is seen along the descending thoracic aorta. The great vessels are within normal limits. Mediastinum/Nodes: No mediastinal lymphadenopathy is seen. No pericardial effusion is identified. The thyroid gland is unremarkable in appearance. No axillary lymphadenopathy is seen. Calcification is noted about the patient's breast implants. Lungs/Pleura: Bilateral emphysema is noted. No focal consolidation, pleural effusion or pneumothorax is seen. No dominant mass is identified. Upper Abdomen: The visualized portions of the liver and spleen are unremarkable. The visualized portions of the pancreas, adrenal glands and kidneys are within normal limits. Scattered calcification is seen along the proximal abdominal aorta. Musculoskeletal: No acute osseous abnormalities are identified. The patient is status post vertebroplasty at T5, T6, T7, T10 and T11. The visualized musculature is unremarkable in appearance. Review of the MIP images confirms the above findings. IMPRESSION: 1. No evidence of pulmonary embolus. 2. Bilateral emphysema noted.  3. Prominence of the pulmonary arteries may reflect pulmonary arterial hypertension. Would correlate clinically. 4. Scattered aortic atherosclerosis. Electronically Signed   By: Garald Balding M.D.   On: 12/25/2016 04:07        Scheduled Meds: . cholecalciferol  1,000 Units Oral QPM  . clopidogrel  75 mg Oral Daily  . enoxaparin (LOVENOX) injection  40 mg Subcutaneous Q24H  . isosorbide mononitrate  30 mg Oral Daily  . metoprolol tartrate  25 mg Oral BID  . nortriptyline  20 mg Oral QHS  . pravastatin  20 mg Oral q1800  . predniSONE  15 mg Oral Q breakfast  . tiotropium  18 mcg Inhalation Q0600  . vitamin B-12  1,000 mcg Oral Daily   Continuous  Infusions:   LOS: 0 days    Time spent: > 35 minutes  Velvet Bathe, MD [facility] Pager [phone]  If 7PM-7AM, please contact night-coverage [email or web address] Password [facility] 12/25/2016, 4:42 PM

## 2016-12-25 NOTE — Progress Notes (Signed)
Pt's husband is so anxious and is asking repeatedly that he does not want to stuck in the hospital for no reason and he feels like they are just hanging here and it is so unprofessional according to him,paged MD regarding his concerns.

## 2016-12-25 NOTE — Progress Notes (Signed)
Pt's Husband is unhappy that he has not seen any MD after ED, he looks so anxious and have so many questions regarding plan of care, but I paged MD to let him know that he is eagerly waiting

## 2016-12-25 NOTE — Progress Notes (Signed)
Pt's family member refuse bed alarm, he says that he is here with pt all the time.

## 2016-12-25 NOTE — Progress Notes (Signed)
Attempted to give pt inhaler which was scheduled for 0600. Family member in room states pt has just fallen asleep, to check back later. RT will pass on to oncoming therapist. RT will continue to monitor.

## 2016-12-25 NOTE — Progress Notes (Signed)
RN called to room upon having chest pain, Nitro SL is given 1st dose, vitals taken and stable, 12 lead is under progress now, MD paged, will continue to monitor

## 2016-12-25 NOTE — Progress Notes (Signed)
Pt's troponin is 0.11, MD aware, pt doesnot have active chest pain, vitals stable, will continue to monitor

## 2016-12-26 DIAGNOSIS — I259 Chronic ischemic heart disease, unspecified: Secondary | ICD-10-CM | POA: Diagnosis not present

## 2016-12-26 DIAGNOSIS — I25118 Atherosclerotic heart disease of native coronary artery with other forms of angina pectoris: Secondary | ICD-10-CM

## 2016-12-26 DIAGNOSIS — R079 Chest pain, unspecified: Secondary | ICD-10-CM | POA: Diagnosis not present

## 2016-12-26 DIAGNOSIS — R748 Abnormal levels of other serum enzymes: Secondary | ICD-10-CM | POA: Diagnosis not present

## 2016-12-26 DIAGNOSIS — R002 Palpitations: Secondary | ICD-10-CM

## 2016-12-26 DIAGNOSIS — R0789 Other chest pain: Secondary | ICD-10-CM | POA: Diagnosis not present

## 2016-12-26 DIAGNOSIS — F329 Major depressive disorder, single episode, unspecified: Secondary | ICD-10-CM | POA: Diagnosis not present

## 2016-12-26 DIAGNOSIS — J9611 Chronic respiratory failure with hypoxia: Secondary | ICD-10-CM | POA: Diagnosis not present

## 2016-12-26 DIAGNOSIS — I10 Essential (primary) hypertension: Secondary | ICD-10-CM | POA: Diagnosis not present

## 2016-12-26 DIAGNOSIS — I25110 Atherosclerotic heart disease of native coronary artery with unstable angina pectoris: Secondary | ICD-10-CM | POA: Diagnosis not present

## 2016-12-26 MED ORDER — METOPROLOL TARTRATE 25 MG PO TABS: 25.0000 mg | ORAL_TABLET | Freq: Two times a day (BID) | ORAL | 0 refills | Status: DC

## 2016-12-26 NOTE — Progress Notes (Signed)
Progress Note  Patient Name: Alexis Higgins Date of Encounter: 12/26/2016  Primary Cardiologist: CMcAlhany  Subjective   No chest pain or paltpiations  Inpatient Medications    Scheduled Meds: . cholecalciferol  1,000 Units Oral QPM  . clopidogrel  75 mg Oral Daily  . enoxaparin (LOVENOX) injection  40 mg Subcutaneous Q24H  . isosorbide mononitrate  30 mg Oral Daily  . metoprolol tartrate  25 mg Oral BID  . nortriptyline  20 mg Oral QHS  . pravastatin  20 mg Oral q1800  . predniSONE  15 mg Oral Q breakfast  . tiotropium  18 mcg Inhalation Q0600  . vitamin B-12  1,000 mcg Oral Daily   Continuous Infusions:  PRN Meds: acetaminophen, docusate sodium, fentaNYL (SUBLIMAZE) injection, ipratropium-albuterol, nitroGLYCERIN, ondansetron (ZOFRAN) IV, polyethylene glycol, polyvinyl alcohol, traMADol   Vital Signs    Vitals:   12/25/16 1212 12/25/16 1300 12/25/16 2100 12/26/16 0632  BP: (!) 155/88 139/86 (!) 149/84 (!) 151/72  Pulse: 100 93 99 91  Resp: 18 18 18 18   Temp: 98.7 F (37.1 C)  98.6 F (37 C) 98.2 F (36.8 C)  TempSrc: Oral  Oral Oral  SpO2: 95% 99% 94% 96%  Weight:    125 lb 14.4 oz (57.1 kg)  Height:        Intake/Output Summary (Last 24 hours) at 12/26/16 1127 Last data filed at 12/26/16 0835  Gross per 24 hour  Intake              480 ml  Output             1350 ml  Net             -870 ml   Filed Weights   12/24/16 2113 12/25/16 0421 12/26/16 0632  Weight: 130 lb 12.8 oz (59.3 kg) 130 lb 12.8 oz (59.3 kg) 125 lb 14.4 oz (57.1 kg)    Telemetry    NSR- Personally Reviewed  ECG    NSR, no ST changes - Personally Reviewed  Physical Exam   GEN: No acute distress.   Neck: No JVD Cardiac: RRR, no murmurs, rubs, or gallops.  Respiratory: Clear to auscultation bilaterally. GI: Soft, nontender, non-distended  MS: No edema; No deformity. Neuro:  Nonfocal  Psych: Normal affect   Labs    Chemistry Recent Labs Lab 12/24/16 1729  12/25/16 0733  NA 142 143  K 3.1* 4.4  CL 101 103  CO2 32 29  GLUCOSE 129* 121*  BUN 7 6  CREATININE 0.83 0.65  CALCIUM 9.3 9.0  GFRNONAA >60 >60  GFRAA >60 >60  ANIONGAP 9 11     Hematology Recent Labs Lab 12/24/16 1729 12/25/16 0733  WBC 12.0* 12.4*  RBC 4.19 4.33  HGB 11.7* 12.1  HCT 38.2 39.1  MCV 91.2 90.3  MCH 27.9 27.9  MCHC 30.6 30.9  RDW 13.5 13.6  PLT 253 283    Cardiac Enzymes Recent Labs Lab 12/24/16 2036 12/25/16 0203 12/25/16 0733  TROPONINI <0.03 0.07* 0.11*    Recent Labs Lab 12/24/16 1741  TROPIPOC 0.00     BNPNo results for input(s): BNP, PROBNP in the last 168 hours.   DDimer  Recent Labs Lab 12/24/16 2036  DDIMER 0.79*     Radiology    Dg Chest 2 View  Result Date: 12/24/2016 CLINICAL DATA:  Midsternal chest pain and shortness breath since yesterday. Dry cough for few days. Multiple heart and Lung conditions. EXAM: CHEST  2 VIEW  COMPARISON:  Chest x-ray dated 10/06/2016. FINDINGS: Heart size and mediastinal contours are within normal limits. Coarse lung markings again noted throughout both lungs, stable, suggesting chronic interstitial lung disease. Lungs are hyperexpanded. No pleural effusion or pneumothorax seen. Status post vertebroplasties at multiple levels. No acute or suspicious osseous finding, although the diffuse osteopenia limits characterization of osseous detail. IMPRESSION: 1. No active cardiopulmonary disease. No evidence of pneumonia or pulmonary edema. 2. Hyperexpanded lungs indicating COPD. 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 Electronically Signed   By: Franki Cabot M.D.   On: 12/24/2016 18:38   Ct Angio Chest Pe W Or Wo Contrast  Result Date: 12/25/2016 CLINICAL DATA:  Acute onset of shortness of breath. Elevated D-dimer. Initial encounter. EXAM: CT ANGIOGRAPHY CHEST WITH CONTRAST TECHNIQUE: Multidetector CT imaging of the chest was performed using the standard protocol during bolus  administration of intravenous contrast. Multiplanar CT image reconstructions and MIPs were obtained to evaluate the vascular anatomy. CONTRAST:  70 mL of Isovue 370 IV contrast COMPARISON:  CTA of the chest performed 09/10/2016 FINDINGS: Cardiovascular:  There is no evidence of pulmonary embolus. Prominence of the bilateral pulmonary arteries may reflect pulmonary arterial hypertension. The heart remains normal in size. Minimal calcification is seen along the descending thoracic aorta. The great vessels are within normal limits. Mediastinum/Nodes: No mediastinal lymphadenopathy is seen. No pericardial effusion is identified. The thyroid gland is unremarkable in appearance. No axillary lymphadenopathy is seen. Calcification is noted about the patient's breast implants. Lungs/Pleura: Bilateral emphysema is noted. No focal consolidation, pleural effusion or pneumothorax is seen. No dominant mass is identified. Upper Abdomen: The visualized portions of the liver and spleen are unremarkable. The visualized portions of the pancreas, adrenal glands and kidneys are within normal limits. Scattered calcification is seen along the proximal abdominal aorta. Musculoskeletal: No acute osseous abnormalities are identified. The patient is status post vertebroplasty at T5, T6, T7, T10 and T11. The visualized musculature is unremarkable in appearance. Review of the MIP images confirms the above findings. IMPRESSION: 1. No evidence of pulmonary embolus. 2. Bilateral emphysema noted. 3. Prominence of the pulmonary arteries may reflect pulmonary arterial hypertension. Would correlate clinically. 4. Scattered aortic atherosclerosis. Electronically Signed   By: Garald Balding M.D.   On: 12/25/2016 04:07    Cardiac Studies   Normal EF in 2012  Patient Profile     76 y.o. female with severe lung disease and palpitations  Assessment & Plan    1) Rhythm stable from a cardiac standpoint. Tolerating increased dose of beta blocker.   Will arrange f/u in the office.   2) Minimally elevated troponins.  No further ischemia w/u planned.  Signed, Larae Grooms, MD  12/26/2016, 11:27 AM

## 2016-12-26 NOTE — Progress Notes (Signed)
Patient refused bed alarm. Will continue to monitor patient. 

## 2016-12-26 NOTE — Discharge Summary (Signed)
Physician Discharge Summary  Alexis Higgins OEU:[id number] DOB: Mar 12, 1941 DOA: 12/24/2016  PCP: Biagio Borg, MD  Admit date: 12/24/2016 Discharge date: 12/26/2016  Time spent: > 35 minutes  Recommendations for Outpatient Follow-up:  1. Please ensure patient follows up with cardiologist   Discharge Diagnoses:  Principal Problem:   Chest pain Active Problems:   Depression   Essential hypertension   CAD, NATIVE VESSEL   COPD (chronic obstructive pulmonary disease) with emphysema ([place])   Discharge Condition: Stable  Diet recommendation: Heart healthy  Filed Weights   12/24/16 2113 12/25/16 0421 12/26/16 1540  Weight: 59.3 kg (130 lb 12.8 oz) 59.3 kg (130 lb 12.8 oz) 57.1 kg (125 lb 14.4 oz)    History of present illness:  76 y.o. female with medical history significant for COPD with chronic steroid and supplemental oxygen dependence, coronary artery disease with stent, and hypertension, now presenting to the emergency department for evaluation of chest pain.  Hospital Course:  Chest discomfort - Cardiology consulted given mild elevation of troponin in context of patient with end-stage COPD on supplemental oxygen. - Cardiologist felt that symptoms are secondary to her heart going out of rhythm and was started on higher dose beta blocker. Currently recommendations or not to pursue additional ischemic evaluation. And chest discomfort resolved after increasing beta blocker regimen  Otherwise continue prior to admission medication regimen for no medical problems listed above. Of note given advanced COPD cardiologist suspect her 1 year survival is very low of which I agree with.  Procedures:  None  Consultations:  Cardiology  Discharge Exam: Vitals:   12/25/16 2100 12/26/16 0632  BP: (!) 149/84 (!) 151/72  Pulse: 99 91  Resp: 18 18  Temp: 98.6 F (37 C) 98.2 F (36.8 C)    General: Patient in no acute distress, alert and awake Cardiovascular: No  cyanosis Respiratory: No increased work of breathing, equal chest rise, no audible wheezes, nasal cannula in place  Discharge Instructions   Discharge Instructions    Call MD for:  redness, tenderness, or signs of infection (pain, swelling, redness, odor or green/yellow discharge around incision site)    Complete by:  As directed    Call MD for:  temperature >100.4    Complete by:  As directed    Diet - low sodium heart healthy    Complete by:  As directed    Discharge instructions    Complete by:  As directed    Please ensure you follow-up with cardiologist   Increase activity slowly    Complete by:  As directed      Current Discharge Medication List    CONTINUE these medications which have CHANGED   Details  metoprolol tartrate (LOPRESSOR) 25 MG tablet Take 1 tablet (25 mg total) by mouth 2 (two) times daily. Qty: 60 tablet, Refills: 0      CONTINUE these medications which have NOT CHANGED   Details  acetaminophen (TYLENOL) 500 MG tablet Take 1,000 mg by mouth as needed for moderate pain or headache.     ascorbic acid (VITAMIN C) 1000 MG tablet Take 1,000 mg by mouth daily.    Cholecalciferol (VITAMIN D) 1000 UNITS capsule Take 1,000 Units by mouth every evening.     clopidogrel (PLAVIX) 75 MG tablet Take 1 tablet (75 mg total) by mouth daily. Qty: 30 tablet, Refills: 2    docusate sodium (COLACE) 100 MG capsule Take 100 mg by mouth daily as needed for mild constipation.     isosorbide  mononitrate (IMDUR) 30 MG 24 hr tablet TAKE 0.5 TABLETS (15 MG TOTAL) BY MOUTH DAILY. Qty: 15 tablet, Refills: 2    Lidocaine HCl (PAIN RELIEF ROLL-ON EX) Apply 1 application topically 2 (two) times daily as needed (PAIN).     lovastatin (MEVACOR) 20 MG tablet TAKE 1 TABLET BY MOUTH AT BEDTIME Qty: 90 tablet, Refills: 3    nitroGLYCERIN (NITROSTAT) 0.4 MG SL tablet Place 1 tablet (0.4 mg total) under the tongue every 5 (five) minutes as needed for chest pain (up to 3 doses). Qty: 25  tablet, Refills: 4    nortriptyline (PAMELOR) 10 MG capsule Take 20 mg by mouth at bedtime.    ondansetron (ZOFRAN) 4 MG tablet Take 1 tablet (4 mg total) by mouth every 8 (eight) hours as needed for nausea or vomiting. Qty: 11 tablet, Refills: 0    OXYGEN Inhale 2 L into the lungs continuous.     polyethylene glycol (MIRALAX / GLYCOLAX) packet Take 17 g by mouth daily as needed for mild constipation.    polyvinyl alcohol (LIQUIFILM TEARS) 1.4 % ophthalmic solution Place 1 drop into both eyes daily as needed for dry eyes.     predniSONE (DELTASONE) 10 MG tablet TAKE 1.5 TABLETS (15 MG TOTAL) BY MOUTH DAILY WITH BREAKFAST. Qty: 45 tablet, Refills: 5    PROAIR HFA 108 (90 Base) MCG/ACT inhaler INHALE 2 PUFFS INTO THE LUNGS EVERY 6 (SIX) HOURS AS NEEDED FOR WHEEZING OR SHORTNESS OF BREATH. Qty: 25.5 Inhaler, Refills: 2    tiotropium (SPIRIVA HANDIHALER) 18 MCG inhalation capsule Place 1 capsule (18 mcg total) into inhaler and inhale daily at 6 (six) AM. Qty: 90 capsule, Refills: 3    traMADol (ULTRAM) 50 MG tablet Take 100 mg by mouth every 6 (six) hours as needed for moderate pain.     vitamin B-12 (CYANOCOBALAMIN) 1000 MCG tablet Take 1,000 mcg by mouth daily.    furosemide (LASIX) 20 MG tablet Take 1 tablet (20 mg total) by mouth daily. Qty: 90 tablet, Refills: 1       Allergies  Allergen Reactions  . Aspirin Other (See Comments)     cns bleed risk  . Codeine Anaphylaxis and Rash  . Other Other (See Comments)    Stiolto - severe reaction - caused inability to breath      The results of significant diagnostics from this hospitalization (including imaging, microbiology, ancillary and laboratory) are listed below for reference.    Significant Diagnostic Studies: Dg Chest 2 View  Result Date: 12/24/2016 CLINICAL DATA:  Midsternal chest pain and shortness breath since yesterday. Dry cough for few days. Multiple heart and Lung conditions. EXAM: CHEST  2 VIEW COMPARISON:  Chest  x-ray dated 10/06/2016. FINDINGS: Heart size and mediastinal contours are within normal limits. Coarse lung markings again noted throughout both lungs, stable, suggesting chronic interstitial lung disease. Lungs are hyperexpanded. No pleural effusion or pneumothorax seen. Status post vertebroplasties at multiple levels. No acute or suspicious osseous finding, although the diffuse osteopenia limits characterization of osseous detail. IMPRESSION: 1. No active cardiopulmonary disease. No evidence of pneumonia or pulmonary edema. 2. Hyperexpanded lungs indicating COPD. 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 Electronically Signed   By: Franki Cabot M.D.   On: 12/24/2016 18:38   Ct Angio Chest Pe W Or Wo Contrast  Result Date: 12/25/2016 CLINICAL DATA:  Acute onset of shortness of breath. Elevated D-dimer. Initial encounter. EXAM: CT ANGIOGRAPHY CHEST WITH CONTRAST TECHNIQUE: Multidetector CT imaging of the chest was performed  using the standard protocol during bolus administration of intravenous contrast. Multiplanar CT image reconstructions and MIPs were obtained to evaluate the vascular anatomy. CONTRAST:  70 mL of Isovue 370 IV contrast COMPARISON:  CTA of the chest performed 09/10/2016 FINDINGS: Cardiovascular:  There is no evidence of pulmonary embolus. Prominence of the bilateral pulmonary arteries may reflect pulmonary arterial hypertension. The heart remains normal in size. Minimal calcification is seen along the descending thoracic aorta. The great vessels are within normal limits. Mediastinum/Nodes: No mediastinal lymphadenopathy is seen. No pericardial effusion is identified. The thyroid gland is unremarkable in appearance. No axillary lymphadenopathy is seen. Calcification is noted about the patient's breast implants. Lungs/Pleura: Bilateral emphysema is noted. No focal consolidation, pleural effusion or pneumothorax is seen. No dominant mass is identified. Upper Abdomen: The  visualized portions of the liver and spleen are unremarkable. The visualized portions of the pancreas, adrenal glands and kidneys are within normal limits. Scattered calcification is seen along the proximal abdominal aorta. Musculoskeletal: No acute osseous abnormalities are identified. The patient is status post vertebroplasty at T5, T6, T7, T10 and T11. The visualized musculature is unremarkable in appearance. Review of the MIP images confirms the above findings. IMPRESSION: 1. No evidence of pulmonary embolus. 2. Bilateral emphysema noted. 3. Prominence of the pulmonary arteries may reflect pulmonary arterial hypertension. Would correlate clinically. 4. Scattered aortic atherosclerosis. Electronically Signed   By: Garald Balding M.D.   On: 12/25/2016 04:07    Microbiology: No results found for this or any previous visit (from the past 240 hour(s)).   Labs: Basic Metabolic Panel:  Recent Labs Lab 12/24/16 1729 12/25/16 0733  NA 142 143  K 3.1* 4.4  CL 101 103  CO2 32 29  GLUCOSE 129* 121*  BUN 7 6  CREATININE 0.83 0.65  CALCIUM 9.3 9.0   Liver Function Tests: No results for input(s): AST, ALT, ALKPHOS, BILITOT, PROT, ALBUMIN in the last 168 hours. No results for input(s): LIPASE, AMYLASE in the last 168 hours. No results for input(s): AMMONIA in the last 168 hours. CBC:  Recent Labs Lab 12/24/16 1729 12/25/16 0733  WBC 12.0* 12.4*  HGB 11.7* 12.1  HCT 38.2 39.1  MCV 91.2 90.3  PLT 253 283   Cardiac Enzymes:  Recent Labs Lab 12/24/16 2036 12/25/16 0203 12/25/16 0733  TROPONINI <0.03 0.07* 0.11*   BNP: BNP (last 3 results) No results for input(s): BNP in the last 8760 hours.  ProBNP (last 3 results) No results for input(s): PROBNP in the last 8760 hours.  CBG: No results for input(s): GLUCAP in the last 168 hours.   Signed:  Velvet Bathe MD.  [facility] 12/26/2016, 1:57 PM

## 2016-12-26 NOTE — Progress Notes (Signed)
Pt continues to refuse bed alarm. Educated. Pt A/O x4. Will continue to monitor. Susie Cassette RN

## 2016-12-27 ENCOUNTER — Telehealth: Payer: Self-pay

## 2016-12-27 NOTE — Telephone Encounter (Signed)
Pt on TCM list. Discharged on 12/26/2016 for precordial pain and elevated troponin. Discharged and instructed to follow up with cardiology.

## 2017-01-06 DIAGNOSIS — M47817 Spondylosis without myelopathy or radiculopathy, lumbosacral region: Secondary | ICD-10-CM | POA: Diagnosis not present

## 2017-01-06 DIAGNOSIS — G4700 Insomnia, unspecified: Secondary | ICD-10-CM | POA: Diagnosis not present

## 2017-01-06 DIAGNOSIS — M961 Postlaminectomy syndrome, not elsewhere classified: Secondary | ICD-10-CM | POA: Diagnosis not present

## 2017-01-06 DIAGNOSIS — G894 Chronic pain syndrome: Secondary | ICD-10-CM | POA: Diagnosis not present

## 2017-01-07 ENCOUNTER — Other Ambulatory Visit: Payer: Self-pay
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
## 2017-01-14 DIAGNOSIS — J449 Chronic obstructive pulmonary disease, unspecified: Secondary | ICD-10-CM | POA: Diagnosis not present

## 2017-01-17 ENCOUNTER — Other Ambulatory Visit: Payer: Self-pay | Admitting: *Deleted

## 2017-01-17 DIAGNOSIS — J449 Chronic obstructive pulmonary disease, unspecified: Secondary | ICD-10-CM | POA: Diagnosis not present

## 2017-01-17 MED ORDER — METOPROLOL TARTRATE 25 MG PO TABS: 25.0000 mg | ORAL_TABLET | Freq: Two times a day (BID) | ORAL | 0 refills | Status: DC

## 2017-01-19 ENCOUNTER — Ambulatory Visit: Payer: Medicare Other | Admitting: Internal Medicine

## 2017-02-04 ENCOUNTER — Other Ambulatory Visit: Payer: Self-pay
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
## 2017-02-08 ENCOUNTER — Ambulatory Visit: Payer: Medicare Other | Admitting: Internal Medicine

## 2017-02-08 NOTE — Telephone Encounter (Signed)
Would you check with her to see if she would like for [place] to schedule modified barium / swallowing evaluation. Thanks.

## 2017-02-08 NOTE — Telephone Encounter (Signed)
Addressed w her at [place]

## 2017-02-10 ENCOUNTER — Telehealth: Payer: Self-pay | Admitting: Internal Medicine

## 2017-02-10 NOTE — Telephone Encounter (Signed)
Pt resch her CPE to October the 19th, she wants to make sure her lab order will not expire and if so wants new orders put in , please advise

## 2017-02-10 NOTE — Telephone Encounter (Signed)
Lab will still be valid

## 2017-02-14 DIAGNOSIS — J449 Chronic obstructive pulmonary disease, unspecified: Secondary | ICD-10-CM | POA: Diagnosis not present

## 2017-02-16 ENCOUNTER — Ambulatory Visit: Payer: Medicare Other | Admitting: Internal Medicine

## 2017-02-17 ENCOUNTER — Other Ambulatory Visit: Payer: Self-pay | Admitting: Cardiovascular Disease

## 2017-02-17 DIAGNOSIS — J449 Chronic obstructive pulmonary disease, unspecified: Secondary | ICD-10-CM | POA: Diagnosis not present

## 2017-02-18 ENCOUNTER — Ambulatory Visit (INDEPENDENT_AMBULATORY_CARE_PROVIDER_SITE_OTHER): Payer: Medicare Other | Admitting: Cardiovascular Disease

## 2017-02-18 ENCOUNTER — Encounter: Payer: Self-pay | Admitting: Cardiovascular Disease

## 2017-02-18 VITALS — BP 120/70 | HR 87 | Ht 66.0 in | Wt 137.0 lb

## 2017-02-18 DIAGNOSIS — I2510 Atherosclerotic heart disease of native coronary artery without angina pectoris: Secondary | ICD-10-CM

## 2017-02-18 DIAGNOSIS — E782 Mixed hyperlipidemia: Secondary | ICD-10-CM

## 2017-02-18 DIAGNOSIS — I10 Essential (primary) hypertension: Secondary | ICD-10-CM | POA: Diagnosis not present

## 2017-02-18 MED ORDER — CLOPIDOGREL BISULFATE 75 MG PO TABS: 75.0000 mg | ORAL_TABLET | Freq: Every day | ORAL | 3 refills | Status: DC

## 2017-02-18 MED ORDER — ISOSORBIDE MONONITRATE ER 30 MG PO TB24: ORAL_TABLET | ORAL | 3 refills | Status: DC

## 2017-02-18 MED ORDER — METOPROLOL TARTRATE 25 MG PO TABS: ORAL_TABLET | ORAL | 3 refills | Status: DC

## 2017-02-18 NOTE — Patient Instructions (Signed)

## 2017-02-18 NOTE — Progress Notes (Signed)
Chief Complaint  Patient presents with  . Follow-up    CAD     History of Present Illness: 76 yo female with history of CAD, PAD, HTN, COPD who is here today for cardiac follow up. She has a hx of CAD, s/p BMS to LAD in 8/10, DES to LAD for ISR 2011. She was admitted to [facility] 11/20-11/21/13 with left shoulder pain. This was reminiscent of her previous angina. Cardiac markers were negative. Cardiac cath November 2013 with patent LAD stent, mild RCA disease. She was placed on isosorbide. Chest x-ray demonstrated questionable left upper lobe nodule. Outpatient CT chest January 2014 with no worrisome masses or nodules. She is followed in the pulmonary office for her severe COPD. She was admitted to [facility] August 2018 with chest pain in setting of palpitations. She was seen by cardiology and beta blocker was increased for suspected palpitations. She was seen by Dr. Lovena Le.   She is here today for follow up. The patient denies any chest pain, palpitations, lower extremity edema, orthopnea, PND, dizziness, near syncope or syncope. Her breathing is at baseline. She is limited by her COPD and is on continuous supplemental O2 therapy.    Primary Care Physician: Biagio Borg, MD   Past Medical History:  Diagnosis Date  . Abnormal chest x-ray    03/2012: will need OP f/u.  Marland Kitchen Anginal pain ([place])   . ANXIETY 01/01/2007  . BURSITIS, RIGHT HIP 06/04/2009  . CAD (coronary artery disease)    a. BMS to LAD 2010. b. NSTEMI with DES to LAD for ISR 2011. c. Patent stent 03/2012/Imdur added.  . Cataract   . CHEST PAIN-PRECORDIAL 01/15/2009  . Colon polyps    H/o tubular adenoma of colon  . COPD 01/01/2007   a. Chronic resp failure on home O2.  Marland Kitchen DEPRESSION 01/01/2007  . Eczema 01/08/2011  . GROIN PAIN 06/20/2008  . Headache(784.0) 01/01/2007  . Hemorrhoid   . HYPERTENSION 01/01/2007  . Impaired glucose tolerance 01/07/2011  . LOW BACK PAIN 01/01/2007  . Muscle weakness (generalized) 06/04/2009  . On home  oxygen therapy    "2L; 24/7" (09/10/2016)  . OSTEOARTHRITIS, HIP 07/01/2008  . OSTEOPOROSIS 01/01/2007  . Pneumonia 1998  . Rosacea 01/08/2011  . Shortness of breath   . SYNCOPE 01/01/2007  . Thoracic compression fracture ([place])   . TIA (transient ischemic attack)   . TRANSIENT ISCHEMIC ATTACK, HX OF 01/01/2007    Past Surgical History:  Procedure Laterality Date  . ABDOMINAL HYSTERECTOMY    . APPENDECTOMY    . BACK SURGERY    . BREAST ENHANCEMENT SURGERY    . COLONOSCOPY  01/25/2002   tubular adenoma,hemorrhoids, hyperplastic  colon polyps  . COLONOSCOPY  02/17/2005   hemorrhoids  . CORONARY ANGIOPLASTY    . CORONARY STENT PLACEMENT    . HIP ARTHROPLASTY Left 10/01/2013   Procedure: ARTHROPLASTY UNIPOLAR   HIP;  Surgeon: Marianna Payment, MD;  Location: [facility];  Service: Orthopedics;  Laterality: Left;  . HIP SURGERY Left    DR XU     PROXIMAL NECK   . IR GENERIC HISTORICAL  02/19/2016   IR VERTEBROPLASTY CERV/THOR BX INC UNI/BIL INC/INJECT/IMAGING 02/19/2016 Luanne Bras, MD [facility]-INTERV RAD  . IR GENERIC HISTORICAL  07/15/2016   IR KYPHO THORACIC WITH BONE BIOPSY 07/15/2016 Luanne Bras, MD [facility]-INTERV RAD  . IR KYPHO EA ADDL LEVEL THORACIC OR LUMBAR  09/14/2016  . IR RADIOLOGIST EVAL & MGMT  09/22/2016  . IR VERTEBROPLASTY  CERV/THOR BX INC UNI/BIL INC/INJECT/IMAGING  10/08/2016  . IR VERTEBROPLASTY CERV/THOR BX INC UNI/BIL INC/INJECT/IMAGING  11/16/2016  . LEFT HEART CATHETERIZATION WITH CORONARY ANGIOGRAM N/A 04/13/2012   Procedure: LEFT HEART CATHETERIZATION WITH CORONARY ANGIOGRAM;  Surgeon: Burnell Blanks, MD;  Location: [facility] CATH LAB;  Service: Cardiovascular;  Laterality: N/A;  . OOPHORECTOMY     one ovary  . s/p bilat cataract  2010  . sp lumbar disc surgury     [name]    Current Outpatient Prescriptions  Medication Sig Dispense Refill  . acetaminophen (TYLENOL) 500 MG tablet Take 1,000 mg by mouth as needed for moderate pain or headache.     Marland Kitchen ascorbic acid  (VITAMIN C) 1000 MG tablet Take 1,000 mg by mouth daily.    . Cholecalciferol (VITAMIN D) 1000 UNITS capsule Take 1,000 Units by mouth every evening.     . clopidogrel (PLAVIX) 75 MG tablet Take 1 tablet (75 mg total) by mouth daily. 90 tablet 3  . docusate sodium (COLACE) 100 MG capsule Take 100 mg by mouth daily as needed for mild constipation.     . furosemide (LASIX) 20 MG tablet Take 1 tablet (20 mg total) by mouth daily. 90 tablet 1  . isosorbide mononitrate (IMDUR) 30 MG 24 hr tablet TAKE 0.5 TABLETS (15 MG TOTAL) BY MOUTH DAILY. 45 tablet 3  . Lidocaine HCl (PAIN RELIEF ROLL-ON EX) Apply 1 application topically 2 (two) times daily as needed (PAIN).     Marland Kitchen lovastatin (MEVACOR) 20 MG tablet TAKE 1 TABLET BY MOUTH AT BEDTIME (Patient taking differently: TAKE 20 MG BY MOUTH AT BEDTIME) 90 tablet 3  . metoprolol tartrate (LOPRESSOR) 25 MG tablet TAKE 1 TABLET BY MOUTH TWICE A DAY 180 tablet 3  . nitroGLYCERIN (NITROSTAT) 0.4 MG SL tablet Place 1 tablet (0.4 mg total) under the tongue every 5 (five) minutes as needed for chest pain (up to 3 doses). 25 tablet 4  . nortriptyline (PAMELOR) 10 MG capsule Take 20 mg by mouth at bedtime.    . ondansetron (ZOFRAN) 4 MG tablet Take 1 tablet (4 mg total) by mouth every 8 (eight) hours as needed for nausea or vomiting. (Patient taking differently: Take 4 mg by mouth 2 (two) times daily. ) 11 tablet 0  . OXYGEN Inhale 2 L into the lungs continuous.     . polyethylene glycol (MIRALAX / GLYCOLAX) packet Take 17 g by mouth daily as needed for mild constipation.    . polyvinyl alcohol (LIQUIFILM TEARS) 1.4 % ophthalmic solution Place 1 drop into both eyes daily as needed for dry eyes.     . predniSONE (DELTASONE) 10 MG tablet TAKE 1.5 TABLETS (15 MG TOTAL) BY MOUTH DAILY WITH BREAKFAST. 45 tablet 5  . PROAIR HFA 108 (90 Base) MCG/ACT inhaler INHALE 2 PUFFS INTO THE LUNGS EVERY 6 (SIX) HOURS AS NEEDED FOR WHEEZING OR SHORTNESS OF BREATH. 25.5 Inhaler 2  .  tiotropium (SPIRIVA HANDIHALER) 18 MCG inhalation capsule Place 1 capsule (18 mcg total) into inhaler and inhale daily at 6 (six) AM. 90 capsule 3  . traMADol (ULTRAM) 50 MG tablet Take 100 mg by mouth every 6 (six) hours as needed for moderate pain.     . vitamin B-12 (CYANOCOBALAMIN) 1000 MCG tablet Take 1,000 mcg by mouth daily.     No current facility-administered medications for this visit.     Allergies  Allergen Reactions  . Aspirin Other (See Comments)     cns bleed risk  .  Codeine Anaphylaxis and Rash  . Other Other (See Comments)    Stiolto - severe reaction - caused inability to breath    Social History   Social History  . Marital status: Married    Spouse name: N/A  . Number of children: 4  . Years of education: N/A   Occupational History  . disabled back since 2003    Social History Main Topics  . Smoking status: Former Smoker    Packs/day: 1.50    Years: 51.00    Types: Cigarettes    Quit date: 07/24/2005  . Smokeless tobacco: Never Used  . Alcohol use No  . Drug use: No  . Sexual activity: Not on file   Other Topics Concern  . Not on file   Social History Narrative  . No narrative on file    Family History  Problem Relation Age of Onset  . Osteoporosis Mother   . Heart disease Sister   . Emphysema Sister   . Seizures Sister        epilepsy  . Cardiomyopathy Sister   . Heart attack Sister     Review of Systems:  As stated in the HPI and otherwise negative.   BP 120/70   Pulse 87   Ht 5\' 6"  (1.676 m)   Wt 137 lb (62.1 kg)   SpO2 97%   BMI 22.11 kg/m   Physical Examination:  General: Well developed, well nourished, NAD  HEENT: OP clear, mucus membranes moist  SKIN: warm, dry. No rashes. Neuro: No focal deficits  Musculoskeletal: Muscle strength 5/5 all ext  Psychiatric: Mood and affect normal  Neck: No JVD, no carotid bruits, no thyromegaly, no lymphadenopathy.  Lungs:Clear bilaterally, no wheezes, rhonci, crackles Cardiovascular:  Regular rate and rhythm. No murmurs, gallops or rubs. Abdomen:Soft. Bowel sounds present. Non-tender.  Extremities: No lower extremity edema. Pulses are 2 + in the bilateral DP/PT.  Chest CT: 06/01/12:  1. No suspicious pulmonary nodule in the left upper lobe to correspond to the plain film abnormality which likely represented a summation of shadows. 2. Stable small pulmonary nodules in the right middle lobe. 3. Central lobular emphysema again demonstrated. 4. Coronary calcifications.  EKG:  EKG is  Not  ordered today. The ekg ordered today demonstrates   Recent Labs: 11/15/2016: ALT 12 12/25/2016: BUN 6; Creatinine, Ser 0.65; Hemoglobin 12.1; Platelets 283; Potassium 4.4; Sodium 143   Lipid Panel    Component Value Date/Time   CHOL 207 (H) 02/05/2016 0856   TRIG 276.0 (H) 02/05/2016 0856   HDL 112.20 02/05/2016 0856   CHOLHDL 2 02/05/2016 0856   VLDL 55.2 (H) 02/05/2016 0856   LDLCALC 57 02/01/2014 1418   LDLDIRECT 49.0 02/05/2016 0856     Wt Readings from Last 3 Encounters:  02/18/17 137 lb (62.1 kg)  12/26/16 125 lb 14.4 oz (57.1 kg)  12/03/16 133 lb (60.3 kg)     Other studies Reviewed: Additional studies/ records that were reviewed today include: . Review of the above records demonstrates:    Assessment and Plan:   1. CAD without angina: She is having no chest pain suggestive of angina. She is on Plavix, Imdur, beta blocker and a statin.    2. Hyperlipidemia: LDL at goal one year ago. Continue statin.  3. Hypertension: BP is controlled. NO changes today  4. Severe COPD: She is now on Prednisone and continuous supplemental O2.  She is limited by her COPD  Current medicines are reviewed at length with the  patient today.  The patient does not have concerns regarding medicines.  The following changes have been made:  no change  Labs/ tests ordered today include:   No orders of the defined types were placed in this encounter.   Disposition:   FU with me in 12   months  Signed, Lauree Chandler, MD 02/18/2017 4:31 PM    [facility] [place], [place], [place]  [address] Phone: [phone]; Fax: [phone]

## 2017-03-08 ENCOUNTER — Other Ambulatory Visit (INDEPENDENT_AMBULATORY_CARE_PROVIDER_SITE_OTHER): Payer: Medicare Other

## 2017-03-08 DIAGNOSIS — Z0001 Encounter for general adult medical examination with abnormal findings: Secondary | ICD-10-CM

## 2017-03-08 DIAGNOSIS — R7302 Impaired glucose tolerance (oral): Secondary | ICD-10-CM

## 2017-03-08 LAB — BASIC METABOLIC PANEL
BUN: 11 mg/dL (ref 6–23)
CHLORIDE: 100 meq/L (ref 96–112)
CO2: 34 meq/L — AB (ref 19–32)
CREATININE: 0.8 mg/dL (ref 0.40–1.20)
Calcium: 9.2 mg/dL (ref 8.4–10.5)
GFR: 74.2 mL/min (ref >60.00)
Glucose, Bld: 87 mg/dL (ref 70–99)
POTASSIUM: 3.5 meq/L (ref 3.5–5.1)
Sodium: 145 mEq/L (ref 135–145)

## 2017-03-08 LAB — HEPATIC FUNCTION PANEL
ALT: 9 U/L (ref 0–35)
AST: 13 U/L (ref 0–37)
Albumin: 3.6 g/dL (ref 3.5–5.2)
Alkaline Phosphatase: 65 U/L (ref 39–117)
BILIRUBIN DIRECT: 0.1 mg/dL (ref 0.0–0.3)
BILIRUBIN TOTAL: 0.4 mg/dL (ref 0.2–1.2)
TOTAL PROTEIN: 6 g/dL (ref 6.0–8.3)

## 2017-03-08 LAB — TSH: TSH: 2.73 u[IU]/mL (ref 0.35–4.50)

## 2017-03-08 LAB — HEMOGLOBIN A1C: HEMOGLOBIN A1C: 5.7 % (ref 4.6–6.5)

## 2017-03-08 LAB — LIPID PANEL
CHOL/HDL RATIO: 2
Cholesterol: 195 mg/dL (ref 0–200)
HDL: 110.1 mg/dL (ref >39.00)
LDL Cholesterol: 51 mg/dL (ref 0–99)
NonHDL: 85.23
TRIGLYCERIDES: 173 mg/dL — AB (ref 0.0–149.0)
VLDL: 34.6 mg/dL (ref 0.0–40.0)

## 2017-03-08 LAB — CBC WITH DIFFERENTIAL/PLATELET
BASOS PCT: 0.5 % (ref 0.0–3.0)
Basophils Absolute: 0.1 10*3/uL (ref 0.0–0.1)
EOS ABS: 0.1 10*3/uL (ref 0.0–0.7)
Eosinophils Relative: 0.8 % (ref 0.0–5.0)
HCT: 40.3 % (ref 36.0–46.0)
HEMOGLOBIN: 13 g/dL (ref 12.0–15.0)
LYMPHS ABS: 3.1 10*3/uL (ref 0.7–4.0)
LYMPHS PCT: 26.7 % (ref 12.0–46.0)
MCHC: 32.2 g/dL (ref 30.0–36.0)
MCV: 87.6 fl (ref 78.0–100.0)
MONO ABS: 0.9 10*3/uL (ref 0.1–1.0)
Monocytes Relative: 8 % (ref 3.0–12.0)
NEUTROS PCT: 64 % (ref 43.0–77.0)
Neutro Abs: 7.5 10*3/uL (ref 1.4–7.7)
Platelets: 259 10*3/uL (ref 150.0–400.0)
RBC: 4.61 Mil/uL (ref 3.87–5.11)
RDW: 13.8 % (ref 11.5–15.5)
WBC: 11.7 10*3/uL — AB (ref 4.0–10.5)

## 2017-03-11 ENCOUNTER — Ambulatory Visit (INDEPENDENT_AMBULATORY_CARE_PROVIDER_SITE_OTHER): Payer: Medicare Other | Admitting: Internal Medicine

## 2017-03-11 ENCOUNTER — Encounter: Payer: Self-pay | Admitting: Internal Medicine

## 2017-03-11 VITALS — BP 106/64 | HR 81 | Temp 98.3°F | Ht 66.0 in | Wt 134.0 lb

## 2017-03-11 DIAGNOSIS — Z23 Encounter for immunization: Secondary | ICD-10-CM | POA: Diagnosis not present

## 2017-03-11 DIAGNOSIS — F329 Major depressive disorder, single episode, unspecified: Secondary | ICD-10-CM | POA: Diagnosis not present

## 2017-03-11 DIAGNOSIS — E785 Hyperlipidemia, unspecified: Secondary | ICD-10-CM

## 2017-03-11 DIAGNOSIS — Z0001 Encounter for general adult medical examination with abnormal findings: Secondary | ICD-10-CM

## 2017-03-11 DIAGNOSIS — R7302 Impaired glucose tolerance (oral): Secondary | ICD-10-CM

## 2017-03-11 DIAGNOSIS — R1013 Epigastric pain: Secondary | ICD-10-CM

## 2017-03-11 DIAGNOSIS — J439 Emphysema, unspecified: Secondary | ICD-10-CM

## 2017-03-11 DIAGNOSIS — I10 Essential (primary) hypertension: Secondary | ICD-10-CM

## 2017-03-11 DIAGNOSIS — F32A Depression, unspecified: Secondary | ICD-10-CM

## 2017-03-11 DIAGNOSIS — R609 Edema, unspecified: Secondary | ICD-10-CM

## 2017-03-11 MED ORDER — CITALOPRAM HYDROBROMIDE 10 MG PO TABS: 10.0000 mg | ORAL_TABLET | Freq: Every day | ORAL | 3 refills | Status: DC

## 2017-03-11 MED ORDER — FUROSEMIDE 20 MG PO TABS
20.0000 mg | ORAL_TABLET | Freq: Two times a day (BID) | ORAL | 3 refills | Status: DC | PRN
Start: 2017-03-11 — End: 2018-08-24

## 2017-03-11 MED ORDER — PANTOPRAZOLE SODIUM 40 MG PO TBEC: 40.0000 mg | DELAYED_RELEASE_TABLET | Freq: Every day | ORAL | 3 refills | Status: DC

## 2017-03-11 NOTE — Assessment & Plan Note (Signed)
stable overall by history and exam, recent data reviewed with pt, and pt to continue medical treatment as before,  to f/u any worsening symptoms or concerns BP Readings from Last 3 Encounters:  03/11/17 106/64  02/18/17 120/70  12/26/16 (!) 145/73

## 2017-03-11 NOTE — Assessment & Plan Note (Signed)
Mild to mod, no SI, for celexa 10 qd, declines referral counseling or psychiatry  to f/u any worsening symptoms or concerns

## 2017-03-11 NOTE — Assessment & Plan Note (Signed)

## 2017-03-11 NOTE — Assessment & Plan Note (Signed)
stable overall by history and exam, recent data reviewed with pt, and pt to continue medical treatment as before,  to f/u any worsening symptoms or concerns Lab Results  Component Value Date   HGBA1C 5.7 03/08/2017

## 2017-03-11 NOTE — Assessment & Plan Note (Signed)
stable overall by history and exam, and pt to continue medical treatment as before,  to f/u any worsening symptoms or concerns 

## 2017-03-11 NOTE — Assessment & Plan Note (Signed)
stable overall by history and exam, recent data reviewed with pt, and pt to continue medical treatment as before,  to f/u any worsening symptoms or concerns Lab Results  Component Value Date   LDLCALC 51 03/08/2017

## 2017-03-11 NOTE — Progress Notes (Signed)
Subjective:    Patient ID: Alexis Higgins, female    DOB: 10/18/1940, 76 y.o.   MRN: [id number]  HPI  Here for wellness and f/u;  Overall doing ok;  Pt denies Chest pain, worsening SOB, DOE, wheezing, orthopnea, PND, worsening LE edema, palpitations, dizziness or syncope.  Pt denies neurological change such as new headache, facial or extremity weakness.  Pt denies polydipsia, polyuria, or low sugar symptoms. Pt states overall good compliance with treatment and medications, good tolerability, and has been trying to follow appropriate diet. No fever, night sweats, wt loss, loss of appetite, or other constitutional symptoms.  Pt states good ability with ADL's, has low fall risk, home safety reviewed and adequate, no other significant changes in hearing or vision, and only occasionally active with exercise.  No more spells since last hospn   Wt Readings from Last 3 Encounters:  03/11/17 143 lb (64.9 kg)  02/18/17 137 lb (62.1 kg)  12/26/16 125 lb 14.4 oz (57.1 kg)  Wt is elevated with worsening pedal edema and known hx of pulm HTN.  Does take the lasix 20 qd with some increased frequency but edema getting worse at time.  Is actively taking increased fluids as well (8oz) with every miralax use as instructed per GI per husband.  Denies worsening reflux, but has worsening dull persistent mild upper abd discomfort, without dysphagia, n/v, bowel change or blood.  Has had mild worsening depressive symptoms, but no suicidal ideation, or panic.   Past Medical History:  Diagnosis Date  . Abnormal chest x-ray    03/2012: will need OP f/u.  Marland Kitchen Anginal pain ([place])   . ANXIETY 01/01/2007  . BURSITIS, RIGHT HIP 06/04/2009  . CAD (coronary artery disease)    a. BMS to LAD 2010. b. NSTEMI with DES to LAD for ISR 2011. c. Patent stent 03/2012/Imdur added.  . Cataract   . CHEST PAIN-PRECORDIAL 01/15/2009  . Colon polyps    H/o tubular adenoma of colon  . COPD 01/01/2007   a. Chronic resp failure on home O2.  Marland Kitchen  DEPRESSION 01/01/2007  . Eczema 01/08/2011  . GROIN PAIN 06/20/2008  . Headache(784.0) 01/01/2007  . Hemorrhoid   . HYPERTENSION 01/01/2007  . Impaired glucose tolerance 01/07/2011  . LOW BACK PAIN 01/01/2007  . Muscle weakness (generalized) 06/04/2009  . On home oxygen therapy    "2L; 24/7" (09/10/2016)  . OSTEOARTHRITIS, HIP 07/01/2008  . OSTEOPOROSIS 01/01/2007  . Pneumonia 1998  . Rosacea 01/08/2011  . Shortness of breath   . SYNCOPE 01/01/2007  . Thoracic compression fracture ([place])   . TIA (transient ischemic attack)   . TRANSIENT ISCHEMIC ATTACK, HX OF 01/01/2007   Past Surgical History:  Procedure Laterality Date  . ABDOMINAL HYSTERECTOMY    . APPENDECTOMY    . BACK SURGERY    . BREAST ENHANCEMENT SURGERY    . COLONOSCOPY  01/25/2002   tubular adenoma,hemorrhoids, hyperplastic  colon polyps  . COLONOSCOPY  02/17/2005   hemorrhoids  . CORONARY ANGIOPLASTY    . CORONARY STENT PLACEMENT    . HIP ARTHROPLASTY Left 10/01/2013   Procedure: ARTHROPLASTY UNIPOLAR   HIP;  Surgeon: Marianna Payment, MD;  Location: [place];  Service: Orthopedics;  Laterality: Left;  . HIP SURGERY Left    DR XU     PROXIMAL NECK   . IR GENERIC HISTORICAL  02/19/2016   IR VERTEBROPLASTY CERV/THOR BX INC UNI/BIL INC/INJECT/IMAGING 02/19/2016 Luanne Bras, MD [facility]-INTERV RAD  . IR GENERIC HISTORICAL  07/15/2016   IR KYPHO THORACIC WITH BONE BIOPSY 07/15/2016 Luanne Bras, MD [facility]INTER[facility]  . IR KYPHO EA ADDL LEVEL THORACIC OR LUMBAR  09/14/2016  . IR RADIOLOGIST EVAL & MGMT  09/22/2016  . IR VERTEBROPLASTY CERV/THOR BX INC UNI/BIL INC/INJECT/IMAGING  10/08/2016  . IR VERTEBROPLASTY CERV/THOR BX INC UNI/BIL INC/INJECT/IMAGING  11/16/2016  . LEFT HEART CATHETERIZATION WITH CORONARY ANGIOGRAM N/A 04/13/2012   Procedure: LEFT HEART CATHETERIZATION WITH CORONARY ANGIOGRAM;  Surgeon: Burnell Blanks, MD;  Location: [facility] CATH LAB;  Service: Cardiovascular;  Laterality: N/A;  . OOPHORECTOMY     one ovary  . s/p  bilat cataract  2010  . sp lumbar disc surgury     [name]    reports that she quit smoking about 11 years ago. Her smoking use included Cigarettes. She has a 76.50 pack-year smoking history. She has never used smokeless tobacco. She reports that she does not drink alcohol or use drugs. family history includes Cardiomyopathy in her sister; Emphysema in her sister; Heart attack in her sister; Heart disease in her sister; Osteoporosis in her mother; Seizures in her sister. Allergies  Allergen Reactions  . Aspirin Other (See Comments)     cns bleed risk  . Codeine Anaphylaxis and Rash  . Other Other (See Comments)    Stiolto - severe reaction - caused inability to breath   Current Outpatient Prescriptions on File Prior to Visit  Medication Sig Dispense Refill  . acetaminophen (TYLENOL) 500 MG tablet Take 1,000 mg by mouth as needed for moderate pain or headache.     Marland Kitchen ascorbic acid (VITAMIN C) 1000 MG tablet Take 1,000 mg by mouth daily.    . Cholecalciferol (VITAMIN D) 1000 UNITS capsule Take 1,000 Units by mouth every evening.     . clopidogrel (PLAVIX) 75 MG tablet Take 1 tablet (75 mg total) by mouth daily. 90 tablet 3  . docusate sodium (COLACE) 100 MG capsule Take 100 mg by mouth daily as needed for mild constipation.     . isosorbide mononitrate (IMDUR) 30 MG 24 hr tablet TAKE 0.5 TABLETS (15 MG TOTAL) BY MOUTH DAILY. 45 tablet 3  . Lidocaine HCl (PAIN RELIEF ROLL-ON EX) Apply 1 application topically 2 (two) times daily as needed (PAIN).     Marland Kitchen lovastatin (MEVACOR) 20 MG tablet TAKE 1 TABLET BY MOUTH AT BEDTIME (Patient taking differently: TAKE 20 MG BY MOUTH AT BEDTIME) 90 tablet 3  . metoprolol tartrate (LOPRESSOR) 25 MG tablet TAKE 1 TABLET BY MOUTH TWICE A DAY 180 tablet 3  . nitroGLYCERIN (NITROSTAT) 0.4 MG SL tablet Place 1 tablet (0.4 mg total) under the tongue every 5 (five) minutes as needed for chest pain (up to 3 doses). 25 tablet 4  . nortriptyline (PAMELOR) 10 MG  capsule Take 20 mg by mouth at bedtime.    . ondansetron (ZOFRAN) 4 MG tablet Take 1 tablet (4 mg total) by mouth every 8 (eight) hours as needed for nausea or vomiting. (Patient taking differently: Take 4 mg by mouth 2 (two) times daily. ) 11 tablet 0  . OXYGEN Inhale 2 L into the lungs continuous.     . polyethylene glycol (MIRALAX / GLYCOLAX) packet Take 17 g by mouth daily as needed for mild constipation.    . polyvinyl alcohol (LIQUIFILM TEARS) 1.4 % ophthalmic solution Place 1 drop into both eyes daily as needed for dry eyes.     . predniSONE (DELTASONE) 10 MG tablet TAKE 1.5 TABLETS (15 MG TOTAL)  BY MOUTH DAILY WITH BREAKFAST. 45 tablet 5  . PROAIR HFA 108 (90 Base) MCG/ACT inhaler INHALE 2 PUFFS INTO THE LUNGS EVERY 6 (SIX) HOURS AS NEEDED FOR WHEEZING OR SHORTNESS OF BREATH. 25.5 Inhaler 2  . tiotropium (SPIRIVA HANDIHALER) 18 MCG inhalation capsule Place 1 capsule (18 mcg total) into inhaler and inhale daily at 6 (six) AM. 90 capsule 3  . traMADol (ULTRAM) 50 MG tablet Take 100 mg by mouth every 6 (six) hours as needed for moderate pain.     . vitamin B-12 (CYANOCOBALAMIN) 1000 MCG tablet Take 1,000 mcg by mouth daily.     No current facility-administered medications on file prior to visit.    Review of Systems Constitutional: Negative for other unusual diaphoresis, sweats, appetite or weight changes HENT: Negative for other worsening hearing loss, ear pain, facial swelling, mouth sores or neck stiffness.   Eyes: Negative for other worsening pain, redness or other visual disturbance.  Respiratory: Negative for other stridor or swelling Cardiovascular: Negative for other palpitations or other chest pain  Gastrointestinal: Negative for worsening diarrhea or loose stools, blood in stool, distention or other pain Genitourinary: Negative for hematuria, flank pain or other change in urine volume.  Musculoskeletal: Negative for myalgias or other joint swelling.  Skin: Negative for other  color change, or other wound or worsening drainage.  Neurological: Negative for other syncope or numbness. Hematological: Negative for other adenopathy or swelling Psychiatric/Behavioral: Negative for hallucinations, other worsening agitation, SI, self-injury, or new decreased concentration All other system neg per pt    Objective:   Physical Exam BP 106/64   Pulse 81   Temp 98.3 F (36.8 C) (Oral)   Ht 5\' 6"  (1.676 m)   Wt 143 lb (64.9 kg)   SpO2 92%   BMI 23.08 kg/m  VS noted, on Home o2, examined in wheelchair Constitutional: Pt is oriented to person, place, and time. Appears well-developed and well-nourished, in no significant distress and comfortable Head: Normocephalic and atraumatic  Eyes: Conjunctivae and EOM are normal. Pupils are equal, round, and reactive to light Right Ear: External ear normal without discharge Left Ear: External ear normal without discharge Nose: Nose without discharge or deformity Mouth/Throat: Oropharynx is without other ulcerations and moist  Neck: Normal range of motion. Neck supple. No JVD present. No tracheal deviation present or significant neck LA or mass Cardiovascular: Normal rate, regular rhythm, normal heart sounds and intact distal pulses.   Pulmonary/Chest: WOB normal and breath sounds decreased without rales or wheezing  Abdominal: Soft. Bowel sounds are normal. NT except mild epigastric tender without guarding or rebound. No HSM  Musculoskeletal: Normal range of motion. Exhibits 1-2+ bilat ankle edema Lymphadenopathy: Has no other cervical adenopathy.  Neurological: Pt is alert and oriented to person, place, and time. Pt has normal reflexes. No cranial nerve deficit. Motor grossly intact, Gait intact Skin: Skin is warm and dry. No rash noted or new ulcerations Psychiatric:  Has depressed mood and affect. Behavior is normal without agitation No other exam findings Lab Results  Component Value Date   WBC 11.7 (H) 03/08/2017   HGB 13.0  03/08/2017   HCT 40.3 03/08/2017   PLT 259.0 03/08/2017   GLUCOSE 87 03/08/2017   CHOL 195 03/08/2017   TRIG 173.0 (H) 03/08/2017   HDL 110.10 03/08/2017   LDLDIRECT 49.0 02/05/2016   LDLCALC 51 03/08/2017   ALT 9 03/08/2017   AST 13 03/08/2017   NA 145 03/08/2017   K 3.5 03/08/2017   CL  100 03/08/2017   CREATININE 0.80 03/08/2017   BUN 11 03/08/2017   CO2 34 (H) 03/08/2017   TSH 2.73 03/08/2017   INR 0.87 11/15/2016   HGBA1C 5.7 03/08/2017   12/25/2016 CT ANGIOGRAPHY CHEST WITH CONTRAST - summary IMPRESSION: 1. No evidence of pulmonary embolus. 2. Bilateral emphysema noted. 3. Prominence of the pulmonary arteries may reflect pulmonary arterial hypertension. Would correlate clinically. 4. Scattered aortic atherosclerosis.    Assessment & Plan:

## 2017-03-11 NOTE — Assessment & Plan Note (Addendum)
Suspect related to pulm HTN, declines for now, last echo 2012 with normal EF, ok for lasix 20 bid prn persistent edema,  to f/u any worsening symptoms or concerns  In addition to the time spent performing CPE, I spent an additional 25 minutes face to face,in which greater than 50% of this time was spent in counseling and coordination of care for patient's acute illness as documented, including the differential dx, tx, further evaluation and other management of peripheral edema, depression, COPD, hyperglycemia, HLD, HTN, and epigastric abd pain

## 2017-03-11 NOTE — Assessment & Plan Note (Signed)
Mild, ? Peptic related, no recent red flags, for simple trial PPI, consider GI referral if not improved

## 2017-03-11 NOTE — Patient Instructions (Addendum)
You had the flu shot today  Ok to increase the lasix to 20 mg twice per day as needed  Please take all new medication as prescribed - the protonix, and celexa 10 mg  Please continue all other medications as before, and refills have been done if requested.  Please have the pharmacy call with any other refills you may need.  Please continue your efforts at being more active, low cholesterol diet, and weight control.  You are otherwise up to date with prevention measures today.  Please keep your appointments with your specialists as you may have planned  Please return in 6 months, or sooner if needed

## 2017-03-14 ENCOUNTER — Ambulatory Visit (INDEPENDENT_AMBULATORY_CARE_PROVIDER_SITE_OTHER): Payer: Medicare Other | Admitting: Emergency Medicine

## 2017-03-14 ENCOUNTER — Encounter: Payer: Self-pay | Admitting: Emergency Medicine

## 2017-03-14 VITALS — BP 128/78 | HR 79 | Ht 66.0 in | Wt 131.2 lb

## 2017-03-14 DIAGNOSIS — J439 Emphysema, unspecified: Secondary | ICD-10-CM | POA: Diagnosis not present

## 2017-03-14 DIAGNOSIS — R1310 Dysphagia, unspecified: Secondary | ICD-10-CM

## 2017-03-14 NOTE — Progress Notes (Signed)
   Subjective:    Patient ID: Alexis Higgins, female    DOB: 1940-11-14, 76 y.o.   MRN: [id number]  HPI 76 year old woman, former smoker (75 pack years) with a history of CAD, hypertension, cerebrovascular disease and COPD that has been followed by Dr Gwenette Greet.    PFT performed in August 2014 that I personally reviewed. These showed very severe obstruction with an FEV1 of 0.69 L or 32% of predicted, and a positive bronchodilator response.     ROV 12/03/16 -- Alexis Higgins has a history of severe COPD and chronic hypoxemic respiratory failure. We have treated her over the last year with chronic prednisone. Her course has been complicated by compression fractures and she has required kyphoplasty on multiple occasions. Her current prednisone dose is 15mg  daily. She tried weaning to 10mg , most recently 2 months ago but breathing could not tolerate. She is having severe exertional dyspnea, walking across room at home.    ROV 03/14/17 -- this is a follow-up visit for very severe COPD with associated chronic hypoxemic respiratory failure. She has been on chronic prednisone. We have tried to slowly wean her dose. When she decreased to 14mg  her breathing worsened. She is back to 15mg  now. She is on Spiriva and proAir prn, 4x a day. Her O2 is at 2L/min. Flu shot up to date    Review of Systems  As per HPI.   Objective:   Physical Exam Vitals:   03/14/17 1505  BP: 128/78  Pulse: 79  SpO2: 98%  Weight: 131 lb 4 oz (59.5 kg)  Height: 5\' 6"  (1.676 m)   Gen: Pleasant, thin elderly woman, kyphosis,  in no distress,  normal affect, no distress on 2  L/m  ENT: No lesions,  mouth clear,  oropharynx clear, no postnasal drip  Neck: No JVD, no stridor  Lungs: No use of accessory muscles, Very distant, no wheezes  Cardiovascular: RRR, heart sounds normal, no murmur or gallops, no peripheral edema  Musculoskeletal: No deformities, no cyanosis or clubbing  Neuro: alert, non focal  Skin: Warm, no lesions  or rashes   Assessment & Plan:  COPD (chronic obstructive pulmonary disease) with emphysema (HCC) Please continue your Spiriva daily Take albuterol 2 puffs up to every 4 hours if needed for shortness of breath.  We will reschedule your modified barium swallow  Please continue your oxygen at 2 L/m Follow with Dr Lamonte Sakai in 4 months or sooner if you have any problems.   Baltazar Apo, MD, PhD 03/14/2017, 3:37 PM [facility] [phone] or if no answer [phone]

## 2017-03-14 NOTE — Patient Instructions (Addendum)
Please continue your Spiriva daily Take albuterol 2 puffs up to every 4 hours if needed for shortness of breath.  We will reschedule your modified barium swallow  Please continue your oxygen at 2 L/m Follow with Dr Lamonte Sakai in 4 months or sooner if you have any problems.

## 2017-03-14 NOTE — Assessment & Plan Note (Signed)
Please continue your Spiriva daily Take albuterol 2 puffs up to every 4 hours if needed for shortness of breath.  We will reschedule your modified barium swallow  Please continue your oxygen at 2 L/m Follow with Dr Lamonte Sakai in 4 months or sooner if you have any problems.

## 2017-03-15 ENCOUNTER — Other Ambulatory Visit (HOSPITAL_COMMUNITY): Payer: Self-pay | Admitting: Emergency Medicine

## 2017-03-15 DIAGNOSIS — R1319 Other dysphagia: Secondary | ICD-10-CM

## 2017-03-16 DIAGNOSIS — J449 Chronic obstructive pulmonary disease, unspecified: Secondary | ICD-10-CM | POA: Diagnosis not present

## 2017-03-19 DIAGNOSIS — J449 Chronic obstructive pulmonary disease, unspecified: Secondary | ICD-10-CM | POA: Diagnosis not present

## 2017-03-21 ENCOUNTER — Other Ambulatory Visit: Payer: Self-pay | Admitting: Internal Medicine

## 2017-03-22 ENCOUNTER — Ambulatory Visit (HOSPITAL_COMMUNITY)
Admission: RE | Admit: 2017-03-22 | Discharge: 2017-03-22 | Disposition: A | Discharge status: Home / Self Care | Payer: Medicare Other | Source: Ambulatory Visit | Attending: Emergency Medicine | Admitting: Emergency Medicine

## 2017-03-22 DIAGNOSIS — R1319 Other dysphagia: Secondary | ICD-10-CM

## 2017-03-22 DIAGNOSIS — R1310 Dysphagia, unspecified: Secondary | ICD-10-CM | POA: Diagnosis not present

## 2017-03-23 ENCOUNTER — Ambulatory Visit (HOSPITAL_COMMUNITY): Payer: Medicare Other

## 2017-04-07 ENCOUNTER — Ambulatory Visit: Payer: Self-pay

## 2017-04-07 DIAGNOSIS — M961 Postlaminectomy syndrome, not elsewhere classified: Secondary | ICD-10-CM | POA: Diagnosis not present

## 2017-04-07 DIAGNOSIS — M47817 Spondylosis without myelopathy or radiculopathy, lumbosacral region: Secondary | ICD-10-CM | POA: Diagnosis not present

## 2017-04-07 DIAGNOSIS — G894 Chronic pain syndrome: Secondary | ICD-10-CM | POA: Diagnosis not present

## 2017-04-07 DIAGNOSIS — G4700 Insomnia, unspecified: Secondary | ICD-10-CM | POA: Diagnosis not present

## 2017-04-07 DIAGNOSIS — Z79891 Long term (current) use of opiate analgesic: Secondary | ICD-10-CM | POA: Diagnosis not present

## 2017-04-16 DIAGNOSIS — J449 Chronic obstructive pulmonary disease, unspecified: Secondary | ICD-10-CM | POA: Diagnosis not present

## 2017-04-19 DIAGNOSIS — J449 Chronic obstructive pulmonary disease, unspecified: Secondary | ICD-10-CM | POA: Diagnosis not present

## 2017-04-20 DIAGNOSIS — L820 Inflamed seborrheic keratosis: Secondary | ICD-10-CM | POA: Diagnosis not present

## 2017-04-26 DIAGNOSIS — L728 Other follicular cysts of the skin and subcutaneous tissue: Secondary | ICD-10-CM | POA: Diagnosis not present

## 2017-04-29 ENCOUNTER — Other Ambulatory Visit: Payer: Self-pay

## 2017-04-29 NOTE — Patient Outreach (Signed)
[facility]) Care Management  04/29/17  Alexis Higgins 01-21-1941 [id number]  Attempted to reach patient without success. Left HIPAA compliant voicemail with RNCM contact information and invited patient to return call.  [place] R. Jaymari Cromie, RN, BSN, [place] Management Coordinator [phone]

## 2017-05-12 ENCOUNTER — Other Ambulatory Visit: Payer: Self-pay

## 2017-05-12 NOTE — Patient Outreach (Signed)
Telephone  Assessment: New referral reassigned from [place].    Placed call to patient and explained purpose of call.  Patient requested that I speak with her husband Percell Miller.  Mr Genco states patient is doing about the same with her breathing. States patient has her medications and transportation. Reports patient concern right now is related to abdominal pain and patient has a GI appointment in January.   Reports patient continues to be weak but does her home exercises from PT twice a day.   Husband reports that they have everything under control and can not identify any current nursing needs at this time.   Reviewed case closure with husband and he is in agreement.   Goals previously met  PLAN: Will mail case closure letter with my contact information. Will notify MD of case closure.  Tomasa Rand, RN, BSN, CEN [facility] [employer] [phone]

## 2017-05-16 DIAGNOSIS — J449 Chronic obstructive pulmonary disease, unspecified: Secondary | ICD-10-CM | POA: Diagnosis not present

## 2017-05-19 ENCOUNTER — Other Ambulatory Visit: Payer: Self-pay | Admitting: Acute Care

## 2017-05-19 DIAGNOSIS — Z87891 Personal history of nicotine dependence: Secondary | ICD-10-CM

## 2017-05-19 DIAGNOSIS — J449 Chronic obstructive pulmonary disease, unspecified: Secondary | ICD-10-CM | POA: Diagnosis not present

## 2017-05-19 DIAGNOSIS — Z122 Encounter for screening for malignant neoplasm of respiratory organs: Secondary | ICD-10-CM

## 2017-05-27 ENCOUNTER — Telehealth: Payer: Self-pay | Admitting: Cardiovascular Disease

## 2017-05-27 ENCOUNTER — Other Ambulatory Visit: Payer: Self-pay | Admitting: Internal Medicine

## 2017-05-27 MED ORDER — TIOTROPIUM BROMIDE MONOHYDRATE 18 MCG IN CAPS: 18.0000 ug | ORAL_CAPSULE | Freq: Every day | RESPIRATORY_TRACT | 3 refills | Status: DC

## 2017-05-27 NOTE — Telephone Encounter (Signed)
Mr. Alexis Higgins is calling to ask a question. [name] woke up with a swollen hand and wrist and he is wanting to know is there anything heart related with that . Please call   Thanks

## 2017-05-27 NOTE — Telephone Encounter (Signed)
I spoke with pt's husband who reports pt wears protector on elbow and it must have been too tight.  They removed this and swelling has decreased and wrist is almost back to normal now.

## 2017-06-06 ENCOUNTER — Other Ambulatory Visit: Payer: Self-pay | Admitting: Emergency Medicine

## 2017-06-09 ENCOUNTER — Telehealth: Payer: Self-pay | Admitting: Internal Medicine

## 2017-06-09 NOTE — Telephone Encounter (Signed)
I am not able to say without an OV, please consider ROV

## 2017-06-09 NOTE — Telephone Encounter (Signed)
Copied from [place] [id number]. Topic: Quick Communication - See Telephone Encounter >> Jun 09, 2017  1:09 PM Robina Ade, Helene Kelp D wrote: Patient husband Percell Miller called and said that patient is taking furosemide (LASIX) 20 MG tablet and it is not helping and its making her go to the bathroom more frequently. He would like to talk to Dr. Jenny Reichmann about this and see what he recommands to do or change the medication. Please call patient back, thanks. CRM for notification. See Telephone encounter for: 06/09/17.

## 2017-06-09 NOTE — Telephone Encounter (Signed)
Pt and husband stated that they would check their calander and get back with [place] to schedule an appt

## 2017-06-14 ENCOUNTER — Encounter: Payer: Self-pay | Admitting: Internal Medicine

## 2017-06-14 ENCOUNTER — Other Ambulatory Visit (INDEPENDENT_AMBULATORY_CARE_PROVIDER_SITE_OTHER): Payer: Medicare Other

## 2017-06-14 ENCOUNTER — Ambulatory Visit: Payer: Medicare Other | Admitting: Internal Medicine

## 2017-06-14 VITALS — BP 126/82 | HR 78 | Ht 66.0 in | Wt 127.0 lb

## 2017-06-14 DIAGNOSIS — K5909 Other constipation: Secondary | ICD-10-CM | POA: Diagnosis not present

## 2017-06-14 DIAGNOSIS — R1084 Generalized abdominal pain: Secondary | ICD-10-CM

## 2017-06-14 DIAGNOSIS — R634 Abnormal weight loss: Secondary | ICD-10-CM

## 2017-06-14 DIAGNOSIS — R140 Abdominal distension (gaseous): Secondary | ICD-10-CM

## 2017-06-14 LAB — CREATININE, SERUM: CREATININE: 0.86 mg/dL (ref 0.40–1.20)

## 2017-06-14 LAB — BUN: BUN: 8 mg/dL (ref 6–23)

## 2017-06-14 NOTE — Progress Notes (Signed)
Alexis Higgins 77 y.o. 1940-11-25 [id number]  Assessment & Plan:   Encounter Diagnoses  Name Primary?  . Generalized abdominal pain Yes  . Loss of weight   . Abdominal distention   . 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  She does have this abdominal distention and some tympany on exam, would suggest perhaps some sort of partially obstructive lesion.  Seems unlikely that there would be a colonic mass causing this given normal hemoglobin recent colonoscopy etc.  I do not think this is peptic in nature, we will keep that in mind, she is not on a PPI anymore.  We will evaluate with a CT of the abdomen and pelvis with contrast.  I decided to do this rather than a CT angios I think it will give as a broader look at her situation, will check a BN a creatinine which were normal in October but we need to double check at this point in the intervening time.  Further plans pending that result.  She is satisfied with her chronic constipation treatment at this point.  I appreciate the opportunity to care for this patient. CC: Biagio Borg, MD   Subjective:   Chief Complaint: Abdominal pain and constipation  HPI The patient is here with her husband because of abdominal pain.  She was last seen in our office by Ellouise Newer PA-C because of constipation and abdominal pain.  Treatment of constipation with MiraLAX every other day and Colace daily has provided relief of constipation but the patient continues with a generalized abdominal pain worse in the periumbilical area that intensified with eating.  She and her husband tell us that when she awakens in the morning she feels okay but then starts to hurt its generally sort of a crampy pain and with eating meals is much worse.  Abdominal distention is also noted.  She is not describing vomiting.  Last colonoscopy  2013 with a diminutive adenoma.  Repeat on a routine basis has not been done because of age and comorbidities.  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  Normal hemoglobin and hematocrit in October 2018. Reports unintentional weight loss   Wt Readings from Last 3 Encounters:  06/14/17 127 lb (57.6 kg)  03/14/17 131 lb 4 oz (59.5 kg)  03/11/17 134 lb (60.8 kg)   09/2016 was 67 kg  Allergies  Allergen Reactions  . Aspirin Other (See Comments)     cns bleed risk  . Codeine Anaphylaxis and Rash  . Other Other (See Comments)    Stiolto - severe reaction - caused inability to breath   Current Meds  Medication Sig  . acetaminophen (TYLENOL) 500 MG tablet Take 1,000 mg by mouth as needed for moderate pain or headache.   . Cholecalciferol (VITAMIN D) 1000 UNITS capsule Take 1,000 Units by mouth every evening.   . clopidogrel (PLAVIX) 75 MG tablet Take 1 tablet (75 mg total) by mouth daily.  Marland Kitchen docusate sodium (COLACE) 100 MG capsule Take 100 mg by mouth daily as needed for mild constipation.   . furosemide (LASIX) 20 MG tablet Take 1 tablet (20 mg total) by mouth 2 (two) times daily as needed.  . isosorbide mononitrate (IMDUR) 30 MG 24 hr tablet TAKE 0.5 TABLETS (15 MG TOTAL)  BY MOUTH DAILY.  Marland Kitchen Lidocaine HCl (PAIN RELIEF ROLL-ON EX) Apply 1 application topically 2 (two) times daily as needed (PAIN).   Marland Kitchen lovastatin (MEVACOR) 20 MG tablet TAKE 1 TABLET BY MOUTH AT BEDTIME  . metoprolol tartrate (LOPRESSOR) 25 MG tablet TAKE 1 TABLET BY MOUTH TWICE A DAY  . nitroGLYCERIN (NITROSTAT) 0.4 MG SL tablet Place 1 tablet (0.4 mg total) under the tongue every 5 (five) minutes as needed for chest pain (up to 3 doses).  . ondansetron (ZOFRAN) 4 MG tablet Take 1 tablet (4 mg total) by mouth every 8 (eight) hours as needed for nausea or vomiting. (Patient taking differently: Take 4 mg by mouth 2 (two) times daily. )  .  OXYGEN Inhale 2 L into the lungs continuous.   . polyethylene glycol (MIRALAX / GLYCOLAX) packet Take 17 g by mouth daily as needed for mild constipation.  . polyvinyl alcohol (LIQUIFILM TEARS) 1.4 % ophthalmic solution Place 1 drop into both eyes daily as needed for dry eyes.   . predniSONE (DELTASONE) 10 MG tablet TAKE 1 1/2 TABLET BY MOUTH DAILY WITH BREAKFAST  . PROAIR HFA 108 (90 Base) MCG/ACT inhaler INHALE 2 PUFFS INTO THE LUNGS EVERY 6 (SIX) HOURS AS NEEDED FOR WHEEZING OR SHORTNESS OF BREATH.  . tiotropium (SPIRIVA HANDIHALER) 18 MCG inhalation capsule Place 1 capsule (18 mcg total) into inhaler and inhale daily at 6 (six) AM.  . traMADol (ULTRAM) 50 MG tablet Take 100 mg by mouth every 6 (six) hours as needed for moderate pain.   . traZODone (DESYREL) 50 MG tablet Take 50 mg by mouth at bedtime.   Past Medical History:  Diagnosis Date  . Abnormal chest x-ray    03/2012: will need OP f/u.  Marland Kitchen Anginal pain ([place])   . ANXIETY 01/01/2007  . BURSITIS, RIGHT HIP 06/04/2009  . CAD (coronary artery disease)    a. BMS to LAD 2010. b. NSTEMI with DES to LAD for ISR 2011. c. Patent stent 03/2012/Imdur added.  . Cataract   . CHEST PAIN-PRECORDIAL 01/15/2009  . Colon polyps    H/o tubular adenoma of colon  . COPD 01/01/2007   a. Chronic resp failure on home O2.  Marland Kitchen DEPRESSION 01/01/2007  . Eczema 01/08/2011  . GROIN PAIN 06/20/2008  . Headache(784.0) 01/01/2007  . Hemorrhoid   . HYPERTENSION 01/01/2007  . Impaired glucose tolerance 01/07/2011  . LOW BACK PAIN 01/01/2007  . Muscle weakness (generalized) 06/04/2009  . On home oxygen therapy    "2L; 24/7" (09/10/2016)  . OSTEOARTHRITIS, HIP 07/01/2008  . OSTEOPOROSIS 01/01/2007  . Pneumonia 1998  . Rosacea 01/08/2011  . Shortness of breath   . SYNCOPE 01/01/2007  . Thoracic compression fracture ([place])   . TIA (transient ischemic attack)   . TRANSIENT ISCHEMIC ATTACK, HX OF 01/01/2007   Past Surgical History:  Procedure Laterality Date  . ABDOMINAL  HYSTERECTOMY    . APPENDECTOMY    . BACK SURGERY    . BREAST ENHANCEMENT SURGERY    . COLONOSCOPY  01/25/2002   tubular adenoma,hemorrhoids, hyperplastic  colon polyps  . COLONOSCOPY  02/17/2005   hemorrhoids  . CORONARY ANGIOPLASTY    . CORONARY STENT PLACEMENT    . HIP ARTHROPLASTY Left 10/01/2013   Procedure: ARTHROPLASTY UNIPOLAR   HIP;  Surgeon: Marianna Payment, MD;  Location: [facility];  Service: Orthopedics;  Laterality: Left;  . HIP SURGERY Left    DR XU     PROXIMAL NECK   . IR GENERIC  HISTORICAL  02/19/2016   IR VERTEBROPLASTY CERV/THOR BX INC UNI/BIL INC/INJECT/IMAGING 02/19/2016 Luanne Bras, MD [facility]INTER[facility]  . IR GENERIC HISTORICAL  07/15/2016   IR KYPHO THORACIC WITH BONE BIOPSY 07/15/2016 Luanne Bras, MD [facility]  . IR KYPHO EA ADDL LEVEL THORACIC OR LUMBAR  09/14/2016  . IR RADIOLOGIST EVAL & MGMT  09/22/2016  . IR VERTEBROPLASTY CERV/THOR BX INC UNI/BIL INC/INJECT/IMAGING  10/08/2016  . IR VERTEBROPLASTY CERV/THOR BX INC UNI/BIL INC/INJECT/IMAGING  11/16/2016  . LEFT HEART CATHETERIZATION WITH CORONARY ANGIOGRAM N/A 04/13/2012   Procedure: LEFT HEART CATHETERIZATION WITH CORONARY ANGIOGRAM;  Surgeon: Burnell Blanks, MD;  Location[facility] CATH LAB;  Service: Cardiovascular;  Laterality: N/A;  . OOPHORECTOMY     one ovary  . s/p bilat cataract  2010  . sp lumbar disc surgury     [name]   Social History   Social History Narrative   Married   Disabled   Former smoker, no current tobacco EtOH or drugs   family history includes Cardiomyopathy in her sister; Emphysema in her sister; Heart attack in her sister; Heart disease in her sister; Osteoporosis in her mother; Seizures in her sister.   Review of Systems As above  Objective:   Physical Exam BP 126/82   Pulse 78   Ht 5\' 6"  (1.676 m)   Wt 127 lb (57.6 kg)   BMI 20.50 kg/m  Chronically ill on O2 In wheelchair Eyes anicteric Lungs decreased BS bilat Heart sounds distant + kyphosis abd  is soft and tympanitic in upper abdomen, BS +, no mass, mildly tender diffusely at best mod distended Appropriate mood and affect  Data reviewed  Lab Results  Component Value Date   WBC 11.7 (H) 03/08/2017   HGB 13.0 03/08/2017   HCT 40.3 03/08/2017   MCV 87.6 03/08/2017   PLT 259.0 03/08/2017   Lab Results  Component Value Date   CREATININE 0.80 03/08/2017   BUN 11 03/08/2017   NA 145 03/08/2017   K 3.5 03/08/2017   CL 100 03/08/2017   CO2 34 (H) 03/08/2017    2018 GI notes, colonoscopy and path 2013  CT chest 2018

## 2017-06-14 NOTE — Patient Instructions (Addendum)
Your physician has requested that you go to the basement for the following lab work before leaving today: BUN, creatinine   You have been scheduled for a CT scan of the abdomen and pelvis at [facility] ([address]---this is in the same building as [facility]).   You are scheduled on 06/21/17 at 2:30pm. You should arrive 15 minutes prior to your appointment time for registration. Please follow the written instructions below on the day of your exam:  WARNING: IF YOU ARE ALLERGIC TO IODINE/X-RAY DYE, PLEASE NOTIFY RADIOLOGY IMMEDIATELY AT [phone]! YOU WILL BE GIVEN A 13 HOUR PREMEDICATION PREP.  1) Do not eat or drink anything after 10:30AM (4 hours prior to your test) 2) You have been given 2 bottles of oral contrast to drink. The solution may taste               better if refrigerated, but do NOT add ice or any other liquid to this solution. Shake           well before drinking.    Drink 1 bottle of contrast @ 12:30pm (2 hours prior to your exam)  Drink 1 bottle of contrast @ 1:30pm (1 hour prior to your exam)  You may take any medications as prescribed with a small amount of water except for the following: Metformin, Glucophage, Glucovance, Avandamet, Riomet, Fortamet, Actoplus Met, Janumet, Glumetza or Metaglip. The above medications must be held the day of the exam AND 48 hours after the exam.  The purpose of you drinking the oral contrast is to aid in the visualization of your intestinal tract. The contrast solution may cause some diarrhea. Before your exam is started, you will be given a small amount of fluid to drink. Depending on your individual set of symptoms, you may also receive an intravenous injection of x-ray contrast/dye. Plan on being at [facility] for 30 minutes or longer, depending on the type of exam you are having performed.  This test typically takes 30-45 minutes to complete.  If you have any questions regarding your exam or if you  need to reschedule, you may call the CT department at [phone] between the hours of 8:00 am and 5:00 pm, Monday-Friday.  ________________________________________________________________________

## 2017-06-15 DIAGNOSIS — L728 Other follicular cysts of the skin and subcutaneous tissue: Secondary | ICD-10-CM | POA: Diagnosis not present

## 2017-06-16 DIAGNOSIS — J449 Chronic obstructive pulmonary disease, unspecified: Secondary | ICD-10-CM | POA: Diagnosis not present

## 2017-06-19 DIAGNOSIS — J449 Chronic obstructive pulmonary disease, unspecified: Secondary | ICD-10-CM | POA: Diagnosis not present

## 2017-06-21 ENCOUNTER — Ambulatory Visit (INDEPENDENT_AMBULATORY_CARE_PROVIDER_SITE_OTHER)
Admission: RE | Admit: 2017-06-21 | Discharge: 2017-06-21 | Disposition: A | Discharge status: Home / Self Care | Payer: Medicare Other | Source: Ambulatory Visit | Attending: Internal Medicine | Admitting: Internal Medicine

## 2017-06-21 DIAGNOSIS — R634 Abnormal weight loss: Secondary | ICD-10-CM

## 2017-06-21 DIAGNOSIS — R102 Pelvic and perineal pain: Secondary | ICD-10-CM | POA: Diagnosis not present

## 2017-06-21 DIAGNOSIS — R1084 Generalized abdominal pain: Secondary | ICD-10-CM

## 2017-06-21 DIAGNOSIS — R140 Abdominal distension (gaseous): Secondary | ICD-10-CM

## 2017-06-21 MED ORDER — IOPAMIDOL (ISOVUE-300) INJECTION 61%
100.0000 mL | Freq: Once | INTRAVENOUS | Status: AC | PRN
  Administered 2017-06-21: 100 mL via INTRAVENOUS

## 2017-06-22 NOTE — Progress Notes (Signed)
Please tell her that the CT scan does not show any cause of her problems of abdominal pain and weight loss.  No new issues, either  I would like her to start pantoprazole 40 mg qd before breakfast - # 90 no refill and see me in about 2 months.  If she or husband think she is significantly worsening before then call back sooner

## 2017-06-23 ENCOUNTER — Other Ambulatory Visit: Payer: Self-pay

## 2017-06-23 MED ORDER — PANTOPRAZOLE SODIUM 40 MG PO TBEC: 40.0000 mg | DELAYED_RELEASE_TABLET | Freq: Every day | ORAL | 3 refills | Status: DC

## 2017-06-29 ENCOUNTER — Encounter: Payer: Self-pay | Admitting: Internal Medicine

## 2017-06-30 ENCOUNTER — Encounter: Payer: Self-pay | Admitting: Internal Medicine

## 2017-07-01 ENCOUNTER — Telehealth: Payer: Self-pay

## 2017-07-01 NOTE — Telephone Encounter (Signed)
I spoke with both Alexis Higgins and her husband Percell Miller who is her care giver he said.  They are requesting the latest appointment available to get scoped which will have to be at the hospital because she is on O2.  I will let Dr Carlean Purl know when they have other appointments and he can let me know when we can try and set it up. They are requesting late in the day appointments vs early in the day. Per Dr Carlean Purl she can stay on her Plavix.  I told them this. I told them I will be back in touch.

## 2017-07-01 NOTE — Telephone Encounter (Signed)
-----   Message from Gatha Mayer, MD sent at 06/30/2017  6:59 PM EST ----- Regarding: needs EGD Needs EGD  Dx epigastric pain and weight loss  Please see My Chart messages for more info

## 2017-07-04 ENCOUNTER — Telehealth: Payer: Self-pay | Admitting: Internal Medicine

## 2017-07-04 NOTE — Telephone Encounter (Signed)
Alexis Higgins her husband just wanted [place] to know that Alexis Higgins was dx with scabies in January by her dermatologist. She is on round #2 of a cream treatment.  If this round doesn't clear it up they will do round #3 of the cream.  I will pass this on to Dr Carlean Purl. They wanted him to be aware in case that affects the scheduling of the EGD.

## 2017-07-07 DIAGNOSIS — M961 Postlaminectomy syndrome, not elsewhere classified: Secondary | ICD-10-CM | POA: Diagnosis not present

## 2017-07-07 DIAGNOSIS — M47817 Spondylosis without myelopathy or radiculopathy, lumbosacral region: Secondary | ICD-10-CM | POA: Diagnosis not present

## 2017-07-07 DIAGNOSIS — G894 Chronic pain syndrome: Secondary | ICD-10-CM | POA: Diagnosis not present

## 2017-07-07 DIAGNOSIS — G4700 Insomnia, unspecified: Secondary | ICD-10-CM | POA: Diagnosis not present

## 2017-07-08 ENCOUNTER — Encounter: Payer: Self-pay | Admitting: Internal Medicine

## 2017-07-08 ENCOUNTER — Ambulatory Visit (INDEPENDENT_AMBULATORY_CARE_PROVIDER_SITE_OTHER): Payer: Medicare Other | Admitting: Internal Medicine

## 2017-07-08 VITALS — BP 114/62 | HR 76 | Temp 97.6°F | Ht 66.0 in | Wt 120.0 lb

## 2017-07-08 DIAGNOSIS — L989 Disorder of the skin and subcutaneous tissue, unspecified: Secondary | ICD-10-CM | POA: Diagnosis not present

## 2017-07-08 DIAGNOSIS — F418 Other specified anxiety disorders: Secondary | ICD-10-CM

## 2017-07-08 DIAGNOSIS — L600 Ingrowing nail: Secondary | ICD-10-CM | POA: Diagnosis not present

## 2017-07-08 DIAGNOSIS — L299 Pruritus, unspecified: Secondary | ICD-10-CM | POA: Insufficient documentation

## 2017-07-08 MED ORDER — HYDROXYZINE HCL 10 MG PO TABS: 10.0000 mg | ORAL_TABLET | Freq: Three times a day (TID) | ORAL | 2 refills | Status: DC | PRN

## 2017-07-08 NOTE — Assessment & Plan Note (Signed)
Benign appaering, pt declines second derm opinion

## 2017-07-08 NOTE — Progress Notes (Signed)
Subjective:    Patient ID: Alexis Higgins, female    DOB: 02-07-1941, 77 y.o.   MRN: [id number]  HPI  Here to f/u somewhat distraught frustrated and tearful, depressed appearing with husband remains very supportive in all ways; states she was told to f/u with me regarding further symptoms after tx x 2 cream treatments per derm for possible scabies.  She has several small skin lesion and cysts somewhat itchy and able to get some kind of material out by squeezing the lesion and is convinced one time the material moved on its own on the counter and must be an insect, maybe one that is inside and trying to get out.  Not willing to accept further tx for now for depression, denies SI or HI.  Does have bilat ingrown nails that are increasingly painful, and she is no longer able to do her own nail care.  Husband asks for podiatry referral Past Medical History:  Diagnosis Date  . Abnormal chest x-ray    03/2012: will need OP f/u.  Marland Kitchen Anginal pain ([place])   . ANXIETY 01/01/2007  . BURSITIS, RIGHT HIP 06/04/2009  . CAD (coronary artery disease)    a. BMS to LAD 2010. b. NSTEMI with DES to LAD for ISR 2011. c. Patent stent 03/2012/Imdur added.  . Cataract   . CHEST PAIN-PRECORDIAL 01/15/2009  . Colon polyps    H/o tubular adenoma of colon  . COPD 01/01/2007   a. Chronic resp failure on home O2.  Marland Kitchen DEPRESSION 01/01/2007  . Eczema 01/08/2011  . GROIN PAIN 06/20/2008  . Headache(784.0) 01/01/2007  . Hemorrhoid   . HYPERTENSION 01/01/2007  . Impaired glucose tolerance 01/07/2011  . LOW BACK PAIN 01/01/2007  . Muscle weakness (generalized) 06/04/2009  . On home oxygen therapy    "2L; 24/7" (09/10/2016)  . OSTEOARTHRITIS, HIP 07/01/2008  . OSTEOPOROSIS 01/01/2007  . Pneumonia 1998  . Rosacea 01/08/2011  . Shortness of breath   . SYNCOPE 01/01/2007  . Thoracic compression fracture ([place])   . TIA (transient ischemic attack)   . TRANSIENT ISCHEMIC ATTACK, HX OF 01/01/2007   Past Surgical History:  Procedure  Laterality Date  . ABDOMINAL HYSTERECTOMY    . APPENDECTOMY    . BACK SURGERY    . BREAST ENHANCEMENT SURGERY    . COLONOSCOPY  01/25/2002   tubular adenoma,hemorrhoids, hyperplastic  colon polyps  . COLONOSCOPY  02/17/2005   hemorrhoids  . CORONARY ANGIOPLASTY    . CORONARY STENT PLACEMENT    . HIP ARTHROPLASTY Left 10/01/2013   Procedure: ARTHROPLASTY UNIPOLAR   HIP;  Surgeon: Marianna Payment, MD;  Location: [place];  Service: Orthopedics;  Laterality: Left;  . HIP SURGERY Left    DR XU     PROXIMAL NECK   . IR GENERIC HISTORICAL  02/19/2016   IR VERTEBROPLASTY CERV/THOR BX INC UNI/BIL INC/INJECT/IMAGING 02/19/2016 Luanne Bras, MD [facility]  . IR GENERIC HISTORICAL  07/15/2016   IR KYPHO THORACIC WITH BONE BIOPSY 07/15/2016 Luanne Bras, MD [facility]  . IR KYPHO EA ADDL LEVEL THORACIC OR LUMBAR  09/14/2016  . IR RADIOLOGIST EVAL & MGMT  09/22/2016  . IR VERTEBROPLASTY CERV/THOR BX INC UNI/BIL INC/INJECT/IMAGING  10/08/2016  . IR VERTEBROPLASTY CERV/THOR BX INC UNI/BIL INC/INJECT/IMAGING  11/16/2016  . LEFT HEART CATHETERIZATION WITH CORONARY ANGIOGRAM N/A 04/13/2012   Procedure: LEFT HEART CATHETERIZATION WITH CORONARY ANGIOGRAM;  Surgeon: Burnell Blanks, MD;  Location: [facility] CATH [facility];  Service: Cardiovascular;  Laterality: N/A;  .  OOPHORECTOMY     one ovary  . s/p bilat cataract  2010  . sp lumbar disc surgury     [name]    reports that she quit smoking about 11 years ago. Her smoking use included cigarettes. She has a 76.50 pack-year smoking history. she has never used smokeless tobacco. She reports that she does not drink alcohol or use drugs. family history includes Cardiomyopathy in her sister; Emphysema in her sister; Heart attack in her sister; Heart disease in her sister; Osteoporosis in her mother; Seizures in her sister. Allergies  Allergen Reactions  . Aspirin Other (See Comments)     cns bleed risk  . Codeine Anaphylaxis and Rash  . Other Other  (See Comments)    Stiolto - severe reaction - caused inability to breath   Current Outpatient Medications on File Prior to Visit  Medication Sig Dispense Refill  . acetaminophen (TYLENOL) 500 MG tablet Take 1,000 mg by mouth as needed for moderate pain or headache.     . Cholecalciferol (VITAMIN D) 1000 UNITS capsule Take 1,000 Units by mouth every evening.     . clopidogrel (PLAVIX) 75 MG tablet Take 1 tablet (75 mg total) by mouth daily. 90 tablet 3  . docusate sodium (COLACE) 100 MG capsule Take 100 mg by mouth daily as needed for mild constipation.     . furosemide (LASIX) 20 MG tablet Take 1 tablet (20 mg total) by mouth 2 (two) times daily as needed. 180 tablet 3  . isosorbide mononitrate (IMDUR) 30 MG 24 hr tablet TAKE 0.5 TABLETS (15 MG TOTAL) BY MOUTH DAILY. 45 tablet 3  . Lidocaine HCl (PAIN RELIEF ROLL-ON EX) Apply 1 application topically 2 (two) times daily as needed (PAIN).     Marland Kitchen lovastatin (MEVACOR) 20 MG tablet TAKE 1 TABLET BY MOUTH AT BEDTIME 90 tablet 3  . metoprolol tartrate (LOPRESSOR) 25 MG tablet TAKE 1 TABLET BY MOUTH TWICE A DAY 180 tablet 3  . nitroGLYCERIN (NITROSTAT) 0.4 MG SL tablet Place 1 tablet (0.4 mg total) under the tongue every 5 (five) minutes as needed for chest pain (up to 3 doses). 25 tablet 4  . ondansetron (ZOFRAN) 4 MG tablet Take 1 tablet (4 mg total) by mouth every 8 (eight) hours as needed for nausea or vomiting. (Patient taking differently: Take 4 mg by mouth 2 (two) times daily. ) 11 tablet 0  . OXYGEN Inhale 2 L into the lungs continuous.     . pantoprazole (PROTONIX) 40 MG tablet Take 1 tablet (40 mg total) by mouth daily. 90 tablet 3  . polyethylene glycol (MIRALAX / GLYCOLAX) packet Take 17 g by mouth daily as needed for mild constipation.    . polyvinyl alcohol (LIQUIFILM TEARS) 1.4 % ophthalmic solution Place 1 drop into both eyes daily as needed for dry eyes.     . predniSONE (DELTASONE) 10 MG tablet TAKE 1 1/2 TABLET BY MOUTH DAILY WITH  BREAKFAST 45 tablet 5  . PROAIR HFA 108 (90 Base) MCG/ACT inhaler INHALE 2 PUFFS INTO THE LUNGS EVERY 6 (SIX) HOURS AS NEEDED FOR WHEEZING OR SHORTNESS OF BREATH. 25.5 Inhaler 2  . tiotropium (SPIRIVA HANDIHALER) 18 MCG inhalation capsule Place 1 capsule (18 mcg total) into inhaler and inhale daily at 6 (six) AM. 90 capsule 3  . traMADol (ULTRAM) 50 MG tablet Take 100 mg by mouth every 6 (six) hours as needed for moderate pain.     . traZODone (DESYREL) 50 MG tablet Take 50  mg by mouth at bedtime.    . vitamin B-12 (CYANOCOBALAMIN) 1000 MCG tablet Take 1,000 mcg by mouth daily.     No current facility-administered medications on file prior to visit.    Review of Systems  Constitutional: Negative for other unusual diaphoresis or sweats HENT: Negative for ear discharge or swelling Eyes: Negative for other worsening visual disturbances Respiratory: Negative for stridor or other swelling  Gastrointestinal: Negative for worsening distension or other blood Genitourinary: Negative for retention or other urinary change Musculoskeletal: Negative for other MSK pain or swelling Skin: Negative for color change or other new lesions Neurological: Negative for worsening tremors and other numbness  Psychiatric/Behavioral: Negative for worsening agitation or other fatigue All other system neg per pt    Objective:   Physical Exam BP 114/62   Pulse 76   Temp 97.6 F (36.4 C) (Oral)   Ht 5\' 6"  (1.676 m)   Wt 120 lb (54.4 kg)   SpO2 90%   BMI 19.37 kg/m  VS noted, low normal wt, sitting in wheelchair Constitutional: Pt appears in NAD HENT: Head: NCAT.  Right Ear: External ear normal.  Left Ear: External ear normal.  Eyes: . Pupils are equal, round, and reactive to light. Conjunctivae and EOM are normal Nose: without d/c or deformity Neck: Neck supple. Gross normal ROM Cardiovascular: Normal rate and regular rhythm.   Pulmonary/Chest: Effort normal and breath sounds decreased without rales or  wheezing.  Abd:  Soft, NT, ND, + BS, no organomegaly Neurological: Pt is alert. At baseline orientation, motor grossly intact Skin: Skin is warm. No rashes, no LE edema but has several small benign appearing possibly cystic skin lesions near the neck and several senile purpura to arms which she laments Great toes both have medial ingrown nail with some tender Psychiatric: Pt behavior is normal without agitation  + depressed affect, tearful, frustrated No other exam findings    Assessment & Plan:

## 2017-07-08 NOTE — Telephone Encounter (Signed)
Mikle Bosworth and Percell Miller stopped by after their Dr Jenny Reichmann appointment downstairs to let us know Alexis Higgins does not have the scabies and never did. It was a misdiagnoses per Percell Miller.  They just wanted this passed on to Dr Carlean Purl.

## 2017-07-08 NOTE — Assessment & Plan Note (Signed)
Declines further tx at this time

## 2017-07-08 NOTE — Assessment & Plan Note (Addendum)
Has several benign skin lesions a couple of which small cystic likely able to be expressed, no insects notes, I suspect her attitude and condition most likely related to her chronic illness with depression and anxiety, though not specifically psychotic, ok for atarax prn,  to f/u any worsening symptoms or concerns  Note:  Total time for pt hx, exam, review of record with pt in the room, determination of diagnoses and plan for further eval and tx is > 40 min, with over 50% spent in coordination and counseling of patient including the differential dx, tx, further evaluation and other management of itching, anxiety with depression, skin lesions, and ingrown nails

## 2017-07-08 NOTE — Patient Instructions (Signed)
Please take all new medication as prescribed - the hydroxyzine as needed for itching and nerves  You can also try OTC Benadryl cream for itching areas as well  You will be contacted regarding the referral for: podiatry  Please continue all other medications as before, and refills have been done if requested.  Please have the pharmacy call with any other refills you may need.  Please keep your appointments with your specialists as you may have planned

## 2017-07-08 NOTE — Assessment & Plan Note (Signed)
Has bilat painful ingrown nails to both great toes; for podiatry referral

## 2017-07-17 DIAGNOSIS — J449 Chronic obstructive pulmonary disease, unspecified: Secondary | ICD-10-CM | POA: Diagnosis not present

## 2017-07-18 NOTE — Telephone Encounter (Signed)
I have spoken to Alexis and Alexis Higgins and they checked there calendars for 08/04/17 and 08/10/17 and they are open both days.  I will call tomorrow and set up her EGD and let them know the date and time and then we will set up a pre-visit.

## 2017-07-19 ENCOUNTER — Other Ambulatory Visit: Payer: Self-pay | Admitting: Internal Medicine

## 2017-07-19 ENCOUNTER — Ambulatory Visit: Payer: Medicare Other | Admitting: Emergency Medicine

## 2017-07-19 ENCOUNTER — Encounter: Payer: Self-pay | Admitting: Emergency Medicine

## 2017-07-19 VITALS — BP 120/64 | HR 80 | Ht 66.0 in | Wt 120.8 lb

## 2017-07-19 DIAGNOSIS — R634 Abnormal weight loss: Secondary | ICD-10-CM

## 2017-07-19 DIAGNOSIS — R1013 Epigastric pain: Secondary | ICD-10-CM

## 2017-07-19 DIAGNOSIS — J439 Emphysema, unspecified: Secondary | ICD-10-CM | POA: Diagnosis not present

## 2017-07-19 MED ORDER — ALBUTEROL SULFATE (2.5 MG/3ML) 0.083% IN NEBU: 2.5000 mg | INHALATION_SOLUTION | RESPIRATORY_TRACT | 5 refills | Status: DC | PRN

## 2017-07-19 NOTE — Assessment & Plan Note (Signed)
We discussed possibly starting Trelegy but she has not tolerated Symbicort, Breo in the past.  We may revisit as we go forward.  For now we will continue Spiriva.  She has a new portable nebulizer system called the Flyp nebulizer.  I will try ordering this through [facility].  Try using this to give her albuterol as needed.  Continue Spiriva as you are taking it Keep albuterol available to use 2 puffs up to every 4 hours if needed for shortness of breath, wheezing, chest tightness. We will work on ordering the [employer] nebulizer from [employer].  You may use nebulized albuterol up to every 4 hours if needed (in place of your inhaler). Follow with Dr Lamonte Sakai in 4 months or sooner if you have any problems.

## 2017-07-19 NOTE — Telephone Encounter (Signed)
Mr and Mrs Roller informed that pre-visit appointment is 08/02/17 at 4:30pm and the EGD at [facility] END[facility] is 08/10/17 at 1:30pm, arrive at 12:00pm. They said they appreciate Dr Carlean Purl working with them to get this at a good time for them.

## 2017-07-19 NOTE — Progress Notes (Signed)
Subjective:    Patient ID: Alexis Higgins, female    DOB: 1941/03/11, 77 y.o.   MRN: [id number]  HPI 77 year old woman, former smoker (75 pack years) with a history of CAD, hypertension, cerebrovascular disease and COPD that has been followed by Dr Gwenette Greet.    PFT performed in August 2014 that I personally reviewed. These showed very severe obstruction with an FEV1 of 0.69 L or 32% of predicted, and a positive bronchodilator response.     ROV 12/04/74 -- Alexis Higgins has a history of severe COPD and chronic hypoxemic respiratory failure. We have treated her over the last year with chronic prednisone. Her course has been complicated by compression fractures and she has required kyphoplasty on multiple occasions. Her current prednisone dose is 15mg  daily. She tried weaning to 10mg , most recently 2 months ago but breathing could not tolerate. She is having severe exertional dyspnea, walking across room at home.    ROV 03/15/75 -- this is a follow-up visit for very severe COPD with associated chronic hypoxemic respiratory failure. She has been on chronic prednisone. We have tried to slowly wean her dose. When she decreased to 14mg  her breathing worsened. She is back to 15mg  now. She is on Spiriva and proAir prn, 4x a day. Her O2 is at 2L/min. Flu shot up to date  ROV 07/19/17 --follow-up visit for patient with a history of tobacco, coronary disease, hypertension, CVA and COPD.  She has severe obstruction based on pulmonary function testing.  She underwent a modified barium swallow on 03/22/17 that showed minimal dysphasia without any aspiration or penetration.  She did have some delayed transit of the barium tablet through her esophagus.  She was deemed a mild aspiration risk.  We have continued her on chronic prednisone, currently 15 mg.  She is on Spiriva, uses albuterol approximately 1x a day. No flares since last time.     Review of Systems  As per HPI.   Objective:   Physical Exam Vitals:   07/19/17 1522  BP: 120/64  Pulse: 80  SpO2: 99%  Weight: 120 lb 12.8 oz (54.8 kg)  Height: 5\' 6"  (1.676 m)   Gen: Pleasant, thin elderly woman, kyphosis,  in no distress,  normal affect, no distress on 2  L/m  ENT: No lesions,  mouth clear,  oropharynx clear, no postnasal drip  Neck: No JVD, no stridor  Lungs: No use of accessory muscles, Very distant, no wheezes  Cardiovascular: RRR, heart sounds normal, no murmur or gallops, no peripheral edema  Musculoskeletal: No deformities, no cyanosis or clubbing  Neuro: alert, non focal  Skin: Warm, no lesions or rashes   Assessment & Plan:  COPD (chronic obstructive pulmonary disease) with emphysema (HCC) We discussed possibly starting Trelegy but she has not tolerated Symbicort, Breo in the past.  We may revisit as we go forward.  For now we will continue Spiriva.  She has a new portable nebulizer system called the Flyp nebulizer.  I will try ordering this through [facility].  Try using this to give her albuterol as needed.  Continue Spiriva as you are taking it Keep albuterol available to use 2 puffs up to every 4 hours if needed for shortness of breath, wheezing, chest tightness. We will work on ordering the [employer] nebulizer from [employer].  You may use nebulized albuterol up to every 4 hours if needed (in place of your inhaler). Follow with Dr Lamonte Sakai in 4 months or sooner if you have any problems.  Chronic respiratory failure (HCC) Continue oxygen at 2 L/min at all times   Baltazar Apo, MD, PhD 07/19/2017, 3:59 PM [facility] [phone] or if no answer [phone]

## 2017-07-19 NOTE — Assessment & Plan Note (Signed)
Continue oxygen at 2 L/min at all times. 

## 2017-07-19 NOTE — Patient Instructions (Signed)
Continue Spiriva as you are taking it Keep albuterol available to use 2 puffs up to every 4 hours if needed for shortness of breath, wheezing, chest tightness. We will work on ordering the [employer] nebulizer from [employer].  You may use nebulized albuterol up to every 4 hours if needed (in place of your inhaler). Continue oxygen at 2 L/min at all times Follow with Dr Lamonte Sakai in 4 months or sooner if you have any problems.

## 2017-07-20 ENCOUNTER — Telehealth: Payer: Self-pay | Admitting: Emergency Medicine

## 2017-07-20 DIAGNOSIS — J449 Chronic obstructive pulmonary disease, unspecified: Secondary | ICD-10-CM | POA: Diagnosis not present

## 2017-07-20 NOTE — Telephone Encounter (Signed)
Spoke with [place] at [facility]. He states that they have 3 of these in stock at the [facility] location. The cost would $181.47 at a 15% discount. I called and spoke with the pt's husband, unfortunately they can't afford this cost right now. They both thanked [place] for looking into this for them. Nothing further was needed.

## 2017-07-20 NOTE — Telephone Encounter (Signed)
Spoke with Lorenzo at [facility]. States that [facility] does not carry the Flyp portable nebulizer machine. I advised Corene Cornea that the flyer that the pt's husband brought in yesterday came from the [facility] location. Corene Cornea is going to check with them and try to find out more information and call us back.

## 2017-07-20 NOTE — Telephone Encounter (Signed)
Corene Cornea from [facility] is calling back [phone] ext [phone]

## 2017-07-26 ENCOUNTER — Encounter: Payer: Self-pay | Admitting: Podiatry

## 2017-07-26 ENCOUNTER — Ambulatory Visit: Payer: Medicare Other | Admitting: Podiatry

## 2017-07-26 DIAGNOSIS — M79674 Pain in right toe(s): Secondary | ICD-10-CM

## 2017-07-26 DIAGNOSIS — M79675 Pain in left toe(s): Secondary | ICD-10-CM

## 2017-07-26 DIAGNOSIS — B351 Tinea unguium: Secondary | ICD-10-CM | POA: Diagnosis not present

## 2017-07-26 DIAGNOSIS — Z7901 Long term (current) use of anticoagulants: Secondary | ICD-10-CM

## 2017-07-26 DIAGNOSIS — L600 Ingrowing nail: Secondary | ICD-10-CM | POA: Diagnosis not present

## 2017-07-26 NOTE — Progress Notes (Signed)
Subjective:    Patient ID: Alexis Higgins, female    DOB: 1940/06/25, 77 y.o.   MRN: [id number]  HPI 77 year old female presents the office with concerns of thick, painful, wanted to know that she could not traverse up as well as for painful ingrown toenail to both of her big toes.  She denies any redness or drainage from the toenails.  She is an ingrown toenails her entire life.  Her husband states that they would try to trim the nails but they are getting thick and they can no longer do this.  She has no other concerns today.   Review of Systems  All other systems reviewed and are negative.  Past Medical History:  Diagnosis Date  . Abnormal chest x-ray    03/2012: will need OP f/u.  Marland Kitchen Anginal pain ([place])   . ANXIETY 01/01/2007  . BURSITIS, RIGHT HIP 06/04/2009  . CAD (coronary artery disease)    a. BMS to LAD 2010. b. NSTEMI with DES to LAD for ISR 2011. c. Patent stent 03/2012/Imdur added.  . Cataract   . CHEST PAIN-PRECORDIAL 01/15/2009  . Colon polyps    H/o tubular adenoma of colon  . COPD 01/01/2007   a. Chronic resp failure on home O2.  Marland Kitchen DEPRESSION 01/01/2007  . Eczema 01/08/2011  . GROIN PAIN 06/20/2008  . Headache(784.0) 01/01/2007  . Hemorrhoid   . HYPERTENSION 01/01/2007  . Impaired glucose tolerance 01/07/2011  . LOW BACK PAIN 01/01/2007  . Muscle weakness (generalized) 06/04/2009  . On home oxygen therapy    "2L; 24/7" (09/10/2016)  . OSTEOARTHRITIS, HIP 07/01/2008  . OSTEOPOROSIS 01/01/2007  . Pneumonia 1998  . Rosacea 01/08/2011  . Shortness of breath   . SYNCOPE 01/01/2007  . Thoracic compression fracture ([place])   . TIA (transient ischemic attack)   . TRANSIENT ISCHEMIC ATTACK, HX OF 01/01/2007    Past Surgical History:  Procedure Laterality Date  . ABDOMINAL HYSTERECTOMY    . APPENDECTOMY    . BACK SURGERY    . BREAST ENHANCEMENT SURGERY    . COLONOSCOPY  01/25/2002   tubular adenoma,hemorrhoids, hyperplastic  colon polyps  . COLONOSCOPY  02/17/2005   hemorrhoids  . CORONARY ANGIOPLASTY    . CORONARY STENT PLACEMENT    . HIP ARTHROPLASTY Left 10/01/2013   Procedure: ARTHROPLASTY UNIPOLAR   HIP;  Surgeon: Marianna Payment, MD;  Location: [facility];  Service: Orthopedics;  Laterality: Left;  . HIP SURGERY Left    DR XU     PROXIMAL NECK   . IR GENERIC HISTORICAL  02/19/2016   IR VERTEBROPLASTY CERV/THOR BX INC UNI/BIL INC/INJECT/IMAGING 02/19/2016 Luanne Bras, MD [facility]  . IR GENERIC HISTORICAL  07/15/2016   IR KYPHO THORACIC WITH BONE BIOPSY 07/15/2016 Luanne Bras, MD [facility]  . IR KYPHO EA ADDL LEVEL THORACIC OR LUMBAR  09/14/2016  . IR RADIOLOGIST EVAL & MGMT  09/22/2016  . IR VERTEBROPLASTY CERV/THOR BX INC UNI/BIL INC/INJECT/IMAGING  10/08/2016  . IR VERTEBROPLASTY CERV/THOR BX INC UNI/BIL INC/INJECT/IMAGING  11/16/2016  . LEFT HEART CATHETERIZATION WITH CORONARY ANGIOGRAM N/A 04/13/2012   Procedure: LEFT HEART CATHETERIZATION WITH CORONARY ANGIOGRAM;  Surgeon: Burnell Blanks, MD;  Location: [facility];  Service: Cardiovascular;  Laterality: N/A;  . OOPHORECTOMY     one ovary  . s/p bilat cataract  2010  . sp lumbar disc surgury     [name]     Current Outpatient Medications:  .  acetaminophen (TYLENOL) 500 MG  tablet, Take 1,000 mg by mouth as needed for moderate pain or headache. , Disp: , Rfl:  .  albuterol (PROVENTIL) (2.5 MG/3ML) 0.083% nebulizer solution, Take 3 mLs (2.5 mg total) by nebulization every 4 (four) hours as needed for wheezing or shortness of breath., Disp: 360 mL, Rfl: 5 .  Cholecalciferol (VITAMIN D) 1000 UNITS capsule, Take 1,000 Units by mouth every evening. , Disp: , Rfl:  .  clopidogrel (PLAVIX) 75 MG tablet, Take 1 tablet (75 mg total) by mouth daily., Disp: 90 tablet, Rfl: 3 .  docusate sodium (COLACE) 100 MG capsule, Take 100 mg by mouth daily as needed for mild constipation. , Disp: , Rfl:  .  furosemide (LASIX) 20 MG tablet, Take 1 tablet (20 mg total) by mouth 2 (two)  times daily as needed., Disp: 180 tablet, Rfl: 3 .  hydrOXYzine (ATARAX/VISTARIL) 10 MG tablet, Take 1 tablet (10 mg total) by mouth 3 (three) times daily as needed. For itching or nervousness, Disp: 90 tablet, Rfl: 2 .  isosorbide mononitrate (IMDUR) 30 MG 24 hr tablet, TAKE 0.5 TABLETS (15 MG TOTAL) BY MOUTH DAILY., Disp: 45 tablet, Rfl: 3 .  Lidocaine HCl (PAIN RELIEF ROLL-ON EX), Apply 1 application topically 2 (two) times daily as needed (PAIN). , Disp: , Rfl:  .  lovastatin (MEVACOR) 20 MG tablet, TAKE 1 TABLET BY MOUTH AT BEDTIME, Disp: 90 tablet, Rfl: 3 .  metoprolol tartrate (LOPRESSOR) 25 MG tablet, TAKE 1 TABLET BY MOUTH TWICE A DAY, Disp: 180 tablet, Rfl: 3 .  nitroGLYCERIN (NITROSTAT) 0.4 MG SL tablet, Place 1 tablet (0.4 mg total) under the tongue every 5 (five) minutes as needed for chest pain (up to 3 doses)., Disp: 25 tablet, Rfl: 4 .  ondansetron (ZOFRAN) 4 MG tablet, Take 1 tablet (4 mg total) by mouth every 8 (eight) hours as needed for nausea or vomiting. (Patient taking differently: Take 4 mg by mouth 2 (two) times daily. ), Disp: 11 tablet, Rfl: 0 .  OXYGEN, Inhale 2 L into the lungs continuous. , Disp: , Rfl:  .  polyethylene glycol (MIRALAX / GLYCOLAX) packet, Take 17 g by mouth daily as needed for mild constipation., Disp: , Rfl:  .  polyvinyl alcohol (LIQUIFILM TEARS) 1.4 % ophthalmic solution, Place 1 drop into both eyes daily as needed for dry eyes. , Disp: , Rfl:  .  predniSONE (DELTASONE) 10 MG tablet, TAKE 1 1/2 TABLET BY MOUTH DAILY WITH BREAKFAST, Disp: 45 tablet, Rfl: 5 .  PROAIR HFA 108 (90 Base) MCG/ACT inhaler, INHALE 2 PUFFS INTO THE LUNGS EVERY 6 (SIX) HOURS AS NEEDED FOR WHEEZING OR SHORTNESS OF BREATH., Disp: 25.5 Inhaler, Rfl: 2 .  tiotropium (SPIRIVA HANDIHALER) 18 MCG inhalation capsule, Place 1 capsule (18 mcg total) into inhaler and inhale daily at 6 (six) AM., Disp: 90 capsule, Rfl: 3 .  traMADol (ULTRAM) 50 MG tablet, Take 100 mg by mouth every 6 (six)  hours as needed for moderate pain. , Disp: , Rfl:  .  traZODone (DESYREL) 50 MG tablet, Take 50 mg by mouth at bedtime., Disp: , Rfl:  .  vitamin B-12 (CYANOCOBALAMIN) 1000 MCG tablet, Take 1,000 mcg by mouth daily., Disp: , Rfl:   Allergies  Allergen Reactions  . Aspirin Other (See Comments)     cns bleed risk  . Codeine Anaphylaxis and Rash  . Other Other (See Comments)    Stiolto - severe reaction - caused inability to breath    Social History   Socioeconomic  History  . Marital status: Married    Spouse name: Not on file  . Number of children: 4  . Years of education: Not on file  . Highest education level: Not on file  Social Needs  . Financial resource strain: Not on file  . Food insecurity - worry: Not on file  . Food insecurity - inability: Not on file  . Transportation needs - medical: Not on file  . Transportation needs - non-medical: Not on file  Occupational History  . Occupation: disabled back since 2003  Tobacco Use  . Smoking status: Former Smoker    Packs/day: 1.50    Years: 51.00    Pack years: 76.50    Types: Cigarettes    Last attempt to quit: 07/24/2005    Years since quitting: 12.0  . Smokeless tobacco: Never Used  Substance and Sexual Activity  . Alcohol use: No    Alcohol/week: 0.0 oz  . Drug use: No  . Sexual activity: Not on file  Other Topics Concern  . Not on file  Social History Narrative   Married   Disabled   Former smoker, no current tobacco EtOH or drugs        Objective:   Physical Exam General: AAO x3, NAD  Dermatological: Nails are hypertrophic, dystrophic, brittle, discolored, elongated 10.  There is induration present along the joint of the toenails most significant bilateral hallux toenails.  There is no signs of infection today.  No surrounding redness or drainage. Tenderness nails 1-5 bilaterally. No open lesions or pre-ulcerative lesions are identified today.   Vascular: Dorsalis Pedis artery and Posterior Tibial  artery pedal pulses are palpable bilateral however there is a delayed capillary refill time.  Mild swelling to bilateral feet.  There is chronic mild purple discoloration to bilateral feet.  Neruologic: Sensation appears to be intact with some splinting monofilament  Musculoskeletal: No gross boney pedal deformities bilateral. No pain, crepitus, or limitation noted with foot and ankle range of motion bilateral.      Assessment & Plan:  77 year old female with symptomatic hematemesis with ingrowing toenails -Treatment options discussed including all alternatives, risks, and complications -Etiology of symptoms were discussed -Nails debrided 10 without complications or bleeding.  Discussed partial nail avulsion or total nail avulsion but given her circulation I be hesitant to do this.  Upon debridement of the nail she was having some discomfort to the hallux toenails.  Unfortunately thickening discoloration try to make this as comfortable as possible for her.  I did offer to anesthetize the area but unfortunately because pain is well to do this injection.  I let her toenails soak with some lidocaine cream and this did help.  I was able to debride the toenails today without any complications of bleeding as comfortably as I could.  She did not want to have pain either way even with injection or trimming the toenails. -I will recheck an arterial duplex for circulation. -Daily foot inspection -Follow-up in 9 weeks or sooner if any problems arise. In the meantime, encouraged to call the office with any questions, concerns, change in symptoms.   Celesta Gentile, DPM

## 2017-07-27 DIAGNOSIS — Z9981 Dependence on supplemental oxygen: Secondary | ICD-10-CM | POA: Insufficient documentation

## 2017-07-29 ENCOUNTER — Telehealth: Payer: Self-pay | Admitting: *Deleted

## 2017-07-29 ENCOUNTER — Telehealth: Payer: Self-pay

## 2017-07-29 DIAGNOSIS — L299 Pruritus, unspecified: Secondary | ICD-10-CM

## 2017-07-29 DIAGNOSIS — L819 Disorder of pigmentation, unspecified: Secondary | ICD-10-CM

## 2017-07-29 NOTE — Telephone Encounter (Signed)
-----   Message from Trula Slade, DPM sent at 07/29/2017  6:54 AM EST ----- Please order arterial duplex due to discoloration feet?  Thank you

## 2017-07-29 NOTE — Telephone Encounter (Signed)
This is possible, but we would need a reason, which was not specified by the family, thanks

## 2017-07-29 NOTE — Telephone Encounter (Signed)
Copied from [place] [phone]. Topic: Referral - Request >> Jul 29, 2017  3:06 PM Hewitt Shorts wrote: CRM for notification. See Telephone encounter for: pt husband Ed is calling to see ia referral can be made to infectious disease Dr. Drucilla Schmidt   Best number is [phone]  07/29/17.

## 2017-07-29 NOTE — Telephone Encounter (Signed)
Faxed orders to [facility]. 

## 2017-08-01 NOTE — Telephone Encounter (Signed)
Husband stated that during the last OV it was discussed because they believe she has some type of internal parasites that breaks through her skin. The cream and antihistamine did not relieve the problem.

## 2017-08-01 NOTE — Telephone Encounter (Signed)
Ok referral done 

## 2017-08-01 NOTE — Telephone Encounter (Signed)
This is not possible, though pt believes this  I would not agree with referral to ID, but I could refer to Dermatology, thanks

## 2017-08-01 NOTE — Telephone Encounter (Signed)
Pt has been informed and expressed understanding. She stated that she does not want to go back to Dr. Nevada Crane.

## 2017-08-01 NOTE — Addendum Note (Signed)
Addended by: Biagio Borg on: 08/01/2017 12:08 PM   Modules accepted: Orders

## 2017-08-02 ENCOUNTER — Other Ambulatory Visit: Payer: Self-pay

## 2017-08-02 ENCOUNTER — Ambulatory Visit (AMBULATORY_SURGERY_CENTER): Payer: Self-pay | Admitting: *Deleted

## 2017-08-02 VITALS — Ht 66.0 in | Wt 120.0 lb

## 2017-08-02 DIAGNOSIS — R634 Abnormal weight loss: Secondary | ICD-10-CM

## 2017-08-02 DIAGNOSIS — R1013 Epigastric pain: Secondary | ICD-10-CM

## 2017-08-02 DIAGNOSIS — Z9981 Dependence on supplemental oxygen: Secondary | ICD-10-CM

## 2017-08-02 NOTE — Progress Notes (Signed)
No egg or soy allergy known to patient  No issues with past sedation with any surgeries  or procedures, no intubation problems  No diet pills per patient On home 02 use per patient   blood thinners per patient - pt on plavix and is to stay on plavix for this egd per Dr Carlean Purl  Pt denies issues with constipation  No A fib or A flutter  EMMI video sent to pt's e mail -pt has emmi video on egd already per husband

## 2017-08-03 ENCOUNTER — Encounter (HOSPITAL_COMMUNITY): Payer: Self-pay | Admitting: Emergency Medicine

## 2017-08-03 ENCOUNTER — Other Ambulatory Visit: Payer: Self-pay

## 2017-08-10 ENCOUNTER — Other Ambulatory Visit: Payer: Self-pay

## 2017-08-10 ENCOUNTER — Ambulatory Visit (HOSPITAL_COMMUNITY)
Admission: RE | Admit: 2017-08-10 | Discharge: 2017-08-10 | Disposition: A | Discharge status: Home / Self Care | Payer: Medicare Other | Source: Ambulatory Visit | Attending: Internal Medicine | Admitting: Internal Medicine

## 2017-08-10 ENCOUNTER — Ambulatory Visit (HOSPITAL_COMMUNITY): Payer: Medicare Other | Admitting: Anesthesiology

## 2017-08-10 ENCOUNTER — Encounter (HOSPITAL_COMMUNITY)
Admission: RE | Disposition: A | Discharge status: Home / Self Care | Payer: Self-pay | Source: Ambulatory Visit | Attending: Internal Medicine

## 2017-08-10 ENCOUNTER — Encounter (HOSPITAL_COMMUNITY): Payer: Self-pay | Admitting: *Deleted

## 2017-08-10 DIAGNOSIS — R634 Abnormal weight loss: Secondary | ICD-10-CM

## 2017-08-10 DIAGNOSIS — I10 Essential (primary) hypertension: Secondary | ICD-10-CM | POA: Insufficient documentation

## 2017-08-10 DIAGNOSIS — Z79899 Other long term (current) drug therapy: Secondary | ICD-10-CM | POA: Diagnosis not present

## 2017-08-10 DIAGNOSIS — J449 Chronic obstructive pulmonary disease, unspecified: Secondary | ICD-10-CM | POA: Diagnosis not present

## 2017-08-10 DIAGNOSIS — R1013 Epigastric pain: Secondary | ICD-10-CM

## 2017-08-10 DIAGNOSIS — Z87891 Personal history of nicotine dependence: Secondary | ICD-10-CM | POA: Insufficient documentation

## 2017-08-10 DIAGNOSIS — K5900 Constipation, unspecified: Secondary | ICD-10-CM | POA: Diagnosis not present

## 2017-08-10 DIAGNOSIS — I252 Old myocardial infarction: Secondary | ICD-10-CM | POA: Insufficient documentation

## 2017-08-10 DIAGNOSIS — Z7952 Long term (current) use of systemic steroids: Secondary | ICD-10-CM | POA: Diagnosis not present

## 2017-08-10 DIAGNOSIS — F329 Major depressive disorder, single episode, unspecified: Secondary | ICD-10-CM | POA: Insufficient documentation

## 2017-08-10 DIAGNOSIS — Z955 Presence of coronary angioplasty implant and graft: Secondary | ICD-10-CM | POA: Insufficient documentation

## 2017-08-10 DIAGNOSIS — F419 Anxiety disorder, unspecified: Secondary | ICD-10-CM | POA: Insufficient documentation

## 2017-08-10 DIAGNOSIS — Z8673 Personal history of transient ischemic attack (TIA), and cerebral infarction without residual deficits: Secondary | ICD-10-CM | POA: Insufficient documentation

## 2017-08-10 DIAGNOSIS — I2510 Atherosclerotic heart disease of native coronary artery without angina pectoris: Secondary | ICD-10-CM | POA: Diagnosis not present

## 2017-08-10 DIAGNOSIS — Z9981 Dependence on supplemental oxygen: Secondary | ICD-10-CM | POA: Diagnosis not present

## 2017-08-10 DIAGNOSIS — E785 Hyperlipidemia, unspecified: Secondary | ICD-10-CM | POA: Diagnosis not present

## 2017-08-10 DIAGNOSIS — Z7982 Long term (current) use of aspirin: Secondary | ICD-10-CM | POA: Insufficient documentation

## 2017-08-10 DIAGNOSIS — D62 Acute posthemorrhagic anemia: Secondary | ICD-10-CM | POA: Diagnosis not present

## 2017-08-10 DIAGNOSIS — L719 Rosacea, unspecified: Secondary | ICD-10-CM | POA: Insufficient documentation

## 2017-08-10 HISTORY — PX: ESOPHAGOGASTRODUODENOSCOPY (EGD) WITH PROPOFOL: SHX5813

## 2017-08-10 SURGERY — ESOPHAGOGASTRODUODENOSCOPY (EGD) WITH PROPOFOL
Anesthesia: Monitor Anesthesia Care

## 2017-08-10 MED ORDER — PROPOFOL 500 MG/50ML IV EMUL
INTRAVENOUS | Status: DC | PRN
  Administered 2017-08-10: 100 ug/kg/min via INTRAVENOUS

## 2017-08-10 MED ORDER — LACTATED RINGERS IV SOLN
INTRAVENOUS | Status: DC
  Administered 2017-08-10: 13:00:00 via INTRAVENOUS

## 2017-08-10 MED ORDER — SODIUM CHLORIDE 0.9 % IV SOLN: INTRAVENOUS | Status: DC

## 2017-08-10 MED ORDER — PROPOFOL 10 MG/ML IV BOLUS
INTRAVENOUS | Status: AC
  Filled 2017-08-10: qty 40

## 2017-08-10 MED ORDER — SIMETHICONE 125 MG PO TABS: ORAL_TABLET | ORAL | Status: DC

## 2017-08-10 SURGICAL SUPPLY — 15 items

## 2017-08-10 NOTE — Op Note (Signed)
[facility] Patient Name: Alexis Higgins Procedure Date: 08/10/2017 MRN: [id number] Attending MD: Gatha Mayer , MD Date of Birth: 07/01/1940 CSN: [id number] Age: 77 Admit Type: Outpatient Procedure:                Upper GI endoscopy Indications:              Epigastric abdominal pain Providers:                Gatha Mayer, MD, Alexis Higgins, Alexis Higgins,                            Technician Referring MD:              Medicines:                Propofol per Anesthesia, Monitored Anesthesia Care Complications:            No immediate complications. Estimated Blood Loss:     Estimated blood loss: none. Procedure:                Pre-Anesthesia Assessment:                           - Prior to the procedure, a History and Physical                            was performed, and patient medications and                            allergies were reviewed. The patient's tolerance of                            previous anesthesia was also reviewed. The risks                            and benefits of the procedure and the sedation                            options and risks were discussed with the patient.                            All questions were answered, and informed consent                            was obtained. Prior Anticoagulants: The patient                            last took Plavix (clopidogrel) on the day of the                            procedure. ASA Grade Assessment: III - A patient                            with severe systemic disease. After reviewing the  risks and benefits, the patient was deemed in                            satisfactory condition to undergo the procedure.                           After obtaining informed consent, the endoscope was                            passed under direct vision. Throughout the                            procedure, the patient's blood pressure, pulse, and       oxygen saturations were monitored continuously. The                            Endoscope was introduced through the mouth, and                            advanced to the second part of duodenum. The upper                            GI endoscopy was accomplished without difficulty.                            The patient tolerated the procedure well. Scope In: Scope Out: Findings:      A small amount of food (residue) was found in the gastric body.      The exam was otherwise without abnormality.      The cardia and gastric fundus were normal on retroflexion. Impression:               - A small amount of food (residue) in the stomach.                           - The examination was otherwise normal.                           - No specimens collected. Moderate Sedation:      N/A- Per Anesthesia Care Recommendation:           - Patient has a contact number available for                            emergencies. The signs and symptoms of potential                            delayed complications were discussed with the                            patient. Return to normal activities tomorrow.                            Written discharge instructions were provided to the  patient.                           - Low fiber diet.                           - Use Gas-X PO as directed.                           - I think it is most likely that the severe                            bloating and post-prandial discomfort is a                            byproduct of her COPD.                           Cannot use Reglan as elderly + has long QT                           Will try low fiber dioet and simethicone [name])                           she has f/u me 4/15                           - Continue present medications.                           - Resume Plavix (clopidogrel) at prior dose                            tomorrow. Procedure Code(s):        --- Professional ---                            [phone], Esophagogastroduodenoscopy, flexible,                            transoral; diagnostic, including collection of                            specimen(s) by brushing or washing, when performed                            (separate procedure) Diagnosis Code(s):        --- Professional ---                           R10.13, Epigastric pain CPT copyright 2016 [employer]. All rights reserved. The codes documented in this report are preliminary and upon coder review may  be revised to meet current compliance requirements. Gatha Mayer, MD 08/10/2017 2:58:20 PM This report has been signed electronically. Number of Addenda: 0

## 2017-08-10 NOTE — H&P (Signed)
[facility] History and Physical   Primary Care Physician:  Biagio Borg, MD   Reason for Procedure:   evaluate epigastric pain and bloating  Plan:    EGD    HPI: Alexis Higgins is a 77 y.o. female   She has post prandial bloating and epigastric pain  Seen in Jan - HPI below  The patient is here with her husband because of abdominal pain.  She was last seen in our office by Ellouise Newer PA-C because of constipation and abdominal pain.  Treatment of constipation with MiraLAX every other day and Colace daily has provided relief of constipation but the patient continues with a generalized abdominal pain worse in the periumbilical area that intensified with eating.  She and her husband tell us that when she awakens in the morning she feels okay but then starts to hurt its generally sort of a crampy pain and with eating meals is much worse.  Abdominal distention is also noted.  She is not describing vomiting.  Last colonoscopy 2013 with a diminutive adenoma.  Repeat on a routine basis has not been done because of age and comorbidities.  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  Normal hemoglobin and hematocrit in October 2018. Reports unintentional weight loss  CT scan after that visit unrevealing Pantoprazole 40 mg qd started  Still feels same wgt stable now  Past Medical History:  Diagnosis Date  . Abnormal chest x-ray    03/2012: will need OP f/u.  Marland Kitchen Anginal pain ([place])   . ANXIETY 01/01/2007  . Blood transfusion without reported diagnosis 2015  . BURSITIS, RIGHT HIP 06/04/2009  . CAD (coronary artery disease)    a. BMS to LAD 2010. b. NSTEMI with DES to LAD for ISR 2011. c. Patent stent 03/2012/Imdur added.  . Cataract   . CHEST PAIN-PRECORDIAL 01/15/2009  . Colon polyps    H/o tubular adenoma of colon  . COPD 01/01/2007   a. Chronic resp failure on home O2.  Marland Kitchen  DEPRESSION 01/01/2007  . Eczema 01/08/2011  . GROIN PAIN 06/20/2008  . Headache(784.0) 01/01/2007  . Hemorrhoid   . HYPERTENSION 01/01/2007  . Impaired glucose tolerance 01/07/2011  . LOW BACK PAIN 01/01/2007  . Muscle weakness (generalized) 06/04/2009  . On home oxygen therapy    "2L; 24/7" (09/10/2016)  . OSTEOARTHRITIS, HIP 07/01/2008  . OSTEOPOROSIS 01/01/2007  . Oxygen deficiency    O2 2 liters 24/7  . Pneumonia 1998  . Rosacea 01/08/2011  . Shortness of breath   . Stroke [facility])    TIA  . SYNCOPE 01/01/2007  . Thoracic compression fracture ([place])   . TIA (transient ischemic attack)   . TRANSIENT ISCHEMIC ATTACK, HX OF 01/01/2007    Past Surgical History:  Procedure Laterality Date  . ABDOMINAL HYSTERECTOMY    . APPENDECTOMY    . BACK SURGERY    . BREAST ENHANCEMENT SURGERY    . COLONOSCOPY  01/25/2002   tubular adenoma,hemorrhoids, hyperplastic  colon polyps  . COLONOSCOPY  02/17/2005   hemorrhoids  . CORONARY ANGIOPLASTY    . CORONARY STENT PLACEMENT    . HIP ARTHROPLASTY Left 10/01/2013   Procedure: ARTHROPLASTY UNIPOLAR   HIP;  Surgeon: Marianna Payment, MD;  Location: [facility];  Service: Orthopedics;  Laterality: Left;  . HIP SURGERY Left    DR XU     PROXIMAL NECK   . IR GENERIC  HISTORICAL  02/19/2016   IR VERTEBROPLASTY CERV/THOR BX INC UNI/BIL INC/INJECT/IMAGING 02/19/2016 Luanne Bras, MD [facility]-INTERV RAD  . IR GENERIC HISTORICAL  07/15/2016   IR KYPHO THORACIC WITH BONE BIOPSY 07/15/2016 Luanne Bras, MD [facility]INTER[facility] RAD  . IR KYPHO EA ADDL LEVEL THORACIC OR LUMBAR  09/14/2016  . IR RADIOLOGIST EVAL & MGMT  09/22/2016  . IR VERTEBROPLASTY CERV/THOR BX INC UNI/BIL INC/INJECT/IMAGING  10/08/2016  . IR VERTEBROPLASTY CERV/THOR BX INC UNI/BIL INC/INJECT/IMAGING  11/16/2016  . LEFT HEART CATHETERIZATION WITH CORONARY ANGIOGRAM N/A 04/13/2012   Procedure: LEFT HEART CATHETERIZATION WITH CORONARY ANGIOGRAM;  Surgeon: Burnell Blanks, MD;  Location: [facility] CATH LAB;  Service:  Cardiovascular;  Laterality: N/A;  . OOPHORECTOMY     one ovary  . POLYPECTOMY    . s/p bilat cataract  2010  . sp lumbar disc surgury     [name]    Prior to Admission medications   Medication Sig Start Date End Date Taking? Authorizing Provider  acetaminophen (TYLENOL) 500 MG tablet Take 1,000 mg by mouth every 6 (six) hours as needed for moderate pain or headache.    Yes [provider]  Cholecalciferol (VITAMIN D) 1000 UNITS capsule Take 1,000 Units by mouth every evening.    Yes [provider]  clopidogrel (PLAVIX) 75 MG tablet Take 1 tablet (75 mg total) by mouth daily. 02/18/17  Yes Burnell Blanks, MD  docusate sodium (COLACE) 100 MG capsule Take 100 mg by mouth daily as needed for mild constipation.    Yes [provider]  furosemide (LASIX) 20 MG tablet Take 1 tablet (20 mg total) by mouth 2 (two) times daily as needed. 03/11/17  Yes Biagio Borg, MD  hydrOXYzine (ATARAX/VISTARIL) 10 MG tablet Take 1 tablet (10 mg total) by mouth 3 (three) times daily as needed. For itching or nervousness 07/08/17  Yes Biagio Borg, MD  isosorbide mononitrate (IMDUR) 30 MG 24 hr tablet TAKE 0.5 TABLETS (15 MG TOTAL) BY MOUTH DAILY. 02/18/17  Yes Burnell Blanks, MD  lovastatin (MEVACOR) 20 MG tablet TAKE 1 TABLET BY MOUTH AT BEDTIME Patient taking differently: TAKE 20 MG BY MOUTH AT BEDTIME 03/21/17  Yes Biagio Borg, MD  metoprolol tartrate (LOPRESSOR) 25 MG tablet TAKE 1 TABLET BY MOUTH TWICE A DAY Patient taking differently: Take 25 mg by mouth 2 (two) times daily.  02/18/17  Yes Burnell Blanks, MD  ondansetron (ZOFRAN) 4 MG tablet Take 1 tablet (4 mg total) by mouth every 8 (eight) hours as needed for nausea or vomiting. 01/06/16  Yes Mackuen, Courteney Lyn, MD  OXYGEN Inhale 2 L into the lungs continuous.    Yes [provider]  polyethylene glycol (MIRALAX / GLYCOLAX) packet Take 17 g by mouth daily as needed for mild  constipation.   Yes [provider]  polyvinyl alcohol (LIQUIFILM TEARS) 1.4 % ophthalmic solution Place 1 drop into both eyes daily as needed for dry eyes.    Yes [provider]  predniSONE (DELTASONE) 10 MG tablet TAKE 1 1/2 TABLET BY MOUTH DAILY WITH BREAKFAST Patient taking differently: TAKE 15 MG BY MOUTH DAILY WITH BREAKFAST 06/07/17  Yes Collene Gobble, MD  PROAIR HFA 108 [phone] Base) MCG/ACT inhaler INHALE 2 PUFFS INTO THE LUNGS EVERY 6 (SIX) HOURS AS NEEDED FOR WHEEZING OR SHORTNESS OF BREATH. 07/20/16  Yes Biagio Borg, MD  tiotropium (SPIRIVA HANDIHALER) 18 MCG inhalation capsule Place 1 capsule (18 mcg total) into inhaler and inhale daily  at 6 (six) AM. 05/27/17  Yes Biagio Borg, MD  traMADol (ULTRAM) 50 MG tablet Take 100 mg by mouth every 6 (six) hours as needed for moderate pain.    Yes [provider]  traZODone (DESYREL) 50 MG tablet Take 50 mg by mouth at bedtime.   Yes [provider]  vitamin B-12 (CYANOCOBALAMIN) 1000 MCG tablet Take 1,000 mcg by mouth daily.   Yes [provider]  albuterol (PROVENTIL) (2.5 MG/3ML) 0.083% nebulizer solution Take 3 mLs (2.5 mg total) by nebulization every 4 (four) hours as needed for wheezing or shortness of breath. Patient not taking: Reported on 08/01/2017 07/19/17   Collene Gobble, MD  Lidocaine HCl (PAIN RELIEF ROLL-ON EX) Apply 1 application topically 2 (two) times daily as needed (for pain).     [provider]  nitroGLYCERIN (NITROSTAT) 0.4 MG SL tablet Place 1 tablet (0.4 mg total) under the tongue every 5 (five) minutes as needed for chest pain (up to 3 doses). 04/13/12   Dunn, Nedra Hai, PA-C  vitamin C (ASCORBIC ACID) 500 MG tablet Take 500 mg by mouth 2 (two) times daily.    [provider]    Current Facility-Administered Medications  Medication Dose Route Frequency Provider Last Rate Last Dose  . lactated ringers infusion   Intravenous Continuous Gatha Mayer, MD 20  mL/hr at 08/10/17 1308      Allergies as of 07/19/2017 - Review Complete 07/19/2017  Allergen Reaction Noted  . Aspirin Other (See Comments) 06/20/2008  . Codeine Anaphylaxis and Rash 01/01/2007  . Other Other (See Comments) 03/15/2014    Family History  Problem Relation Age of Onset  . Osteoporosis Mother   . Heart disease Sister   . Emphysema Sister   . Seizures Sister        epilepsy  . Cardiomyopathy Sister   . Heart attack Sister   . Colon cancer Neg Hx   . Colon polyps Neg Hx   . Esophageal cancer Neg Hx   . Rectal cancer Neg Hx   . Stomach cancer Neg Hx     Social History   Socioeconomic History  . Marital status: Married    Spouse name: Not on file  . Number of children: 4  . Years of education: Not on file  . Highest education level: Not on file  Social Needs  . Financial resource strain: Not on file  . Food insecurity - worry: Not on file  . Food insecurity - inability: Not on file  . Transportation needs - medical: Not on file  . Transportation needs - non-medical: Not on file  Occupational History  . Occupation: disabled back since 2003  Tobacco Use  . Smoking status: Former Smoker    Packs/day: 1.50    Years: 51.00    Pack years: 76.50    Types: Cigarettes    Last attempt to quit: 07/24/2005    Years since quitting: 12.0  . Smokeless tobacco: Never Used  Substance and Sexual Activity  . Alcohol use: No    Alcohol/week: 0.0 oz  . Drug use: No  . Sexual activity: Not on file  Other Topics Concern  . Not on file  Social History Narrative   Married   Disabled   Former smoker, no current tobacco EtOH or drugs    Review of Systems: Positive for chronic dyspnea in COPD All other review of systems negative except as mentioned in the HPI.  Physical Exam: Vital signs in last  24 hours: Temp:  [98.2 F (36.8 C)] 98.2 F (36.8 C) (03/20 1252) Pulse Rate:  [61] 61 (03/20 1252) Resp:  [10] 10 (03/20 1252) BP: (131)/(78) 131/78 (03/20 1252) SpO2:   [100 %] 100 % (03/20 1252) Weight:  [120 lb (54.4 kg)] 120 lb (54.4 kg) (03/20 1251)   General:   Alert,  Well-developed, well-nourished, pleasant and cooperative in NAD Lungs:  Clear throughout to auscultation.   Heart:  Regular rate and rhythm; no murmurs, clicks, rubs,  or gallops. Abdomen:  Soft, nontender and mild-mod distended. Normal bowel sounds.   Neuro/Psych:  Alert and cooperative. Normal mood and affect. A and O x 3   @Tucker Minter  Simonne Maffucci, MD, [facility] [phone] (pager) 08/10/2017 1:37 PM@

## 2017-08-10 NOTE — Transfer of Care (Signed)
Immediate Anesthesia Transfer of Care Note  Patient: Alexis Higgins  Procedure(s) Performed: ESOPHAGOGASTRODUODENOSCOPY (EGD) WITH PROPOFOL (N/A )  Patient Location: PACU and Endoscopy Unit  Anesthesia Type:MAC  Level of Consciousness: drowsy  Airway & Oxygen Therapy: Patient Spontanous Breathing and Patient connected to nasal cannula oxygen  Post-op Assessment: Report given to RN and Post -op Vital signs reviewed and stable  Post vital signs: Reviewed and stable  Last Vitals:  Vitals:   08/10/17 1252  BP: 131/78  Pulse: 61  Resp: 10  Temp: 36.8 C  SpO2: 100%    Last Pain:  Vitals:   08/10/17 1252  TempSrc: Oral         Complications: No apparent anesthesia complications

## 2017-08-10 NOTE — Anesthesia Preprocedure Evaluation (Signed)
Anesthesia Evaluation  Patient identified by MRN, date of birth, ID band Patient awake    Reviewed: Allergy & Precautions, H&P , Patient's Chart, lab work & pertinent test results, reviewed documented beta blocker date and time   Airway Mallampati: II  TM Distance: >3 FB Neck ROM: full    Dental no notable dental hx.    Pulmonary former smoker,    Pulmonary exam normal breath sounds clear to auscultation       Cardiovascular hypertension,  Rhythm:regular Rate:Normal     Neuro/Psych    GI/Hepatic   Endo/Other    Renal/GU      Musculoskeletal   Abdominal   Peds  Hematology   Anesthesia Other Findings On chronic 02; RA sat 94% today  Recently well; chest clear No CAD Sx   Reproductive/Obstetrics                             Anesthesia Physical Anesthesia Plan  ASA: II  Anesthesia Plan: MAC   Post-op Pain Management:    Induction: Intravenous  PONV Risk Score and Plan: Treatment may vary due to age or medical condition  Airway Management Planned: Mask and Natural Airway  Additional Equipment:   Intra-op Plan:   Post-operative Plan:   Informed Consent: I have reviewed the patients History and Physical, chart, labs and discussed the procedure including the risks, benefits and alternatives for the proposed anesthesia with the patient or authorized representative who has indicated his/her understanding and acceptance.   Dental Advisory Given  Plan Discussed with: CRNA and Surgeon  Anesthesia Plan Comments:         Anesthesia Quick Evaluation

## 2017-08-10 NOTE — Discharge Instructions (Addendum)
° °  This test looks ok - a slight amount of retained food.  I think its possible your problems are related to your lung disease.  Its quite common for patients with severe COPD to swallow air as they breathe and eat and get bloated.  Try taking Simethicone (125 ng) before meals and at bedtime.  You may get this at any pharmacy or even a grocery store - a common name is Gas X  You should also avoid carbonated drinks and roughage. Se the low fiber diet also.  I will see you at your April appointment  I appreciate the opportunity to care for you. Gatha Mayer, MD, FACG   YOU HAD AN ENDOSCOPIC PROCEDURE TODAY: Refer to the procedure report and other information in the discharge instructions given to you for any specific questions about what was found during the examination. If this information does not answer your questions, please call Dr. Celesta Aver office at [phone] to clarify.   YOU SHOULD EXPECT: Some feelings of bloating in the abdomen. Passage of more gas than usual. Walking can help get rid of the air that was put into your GI tract during the procedure and reduce the bloating. If you had a lower endoscopy (such as a colonoscopy or flexible sigmoidoscopy) you may notice spotting of blood in your stool or on the toilet paper. Some abdominal soreness may be present for a day or two, also.  DIET: Your first meal following the procedure should be a light meal and then it is ok to progress to your normal diet. A half-sandwich or bowl of soup is an example of a good first meal. Heavy or fried foods are harder to digest and may make you feel nauseous or bloated. Drink plenty of fluids but you should avoid alcoholic beverages for 24 hours.   ACTIVITY: Your care partner should take you home directly after the procedure. You should plan to take it easy, moving slowly for the rest of the day. You can resume normal activity the day after the procedure however YOU SHOULD NOT DRIVE, use  power tools, machinery or perform tasks that involve climbing or major physical exertion for 24 hours (because of the sedation medicines used during the test).   SYMPTOMS TO REPORT IMMEDIATELY: A gastroenterologist can be reached at any hour. Please call [phone]  for any of the following symptoms:   Following upper endoscopy (EGD, EUS, ERCP, esophageal dilation) Vomiting of blood or coffee ground material  New, significant abdominal pain  New, significant chest pain or pain under the shoulder blades  Painful or persistently difficult swallowing  New shortness of breath  Black, tarry-looking or red, bloody stools  .

## 2017-08-11 NOTE — Anesthesia Postprocedure Evaluation (Signed)
Anesthesia Post Note  Patient: Alexis Higgins  Procedure(s) Performed: ESOPHAGOGASTRODUODENOSCOPY (EGD) WITH PROPOFOL (N/A )     Patient location during evaluation: PACU Anesthesia Type: MAC Level of consciousness: awake and alert Pain management: pain level controlled Vital Signs Assessment: post-procedure vital signs reviewed and stable Respiratory status: spontaneous breathing, nonlabored ventilation, respiratory function stable and patient connected to nasal cannula oxygen Cardiovascular status: stable and blood pressure returned to baseline Postop Assessment: no apparent nausea or vomiting Anesthetic complications: no    Last Vitals:  Vitals Value Taken Time  BP    Temp    Pulse    Resp    SpO2      Last Pain:  Vitals:   08/10/17 1445  TempSrc: Oral                 Kamali Sakata EDWARD

## 2017-08-12 ENCOUNTER — Other Ambulatory Visit (INDEPENDENT_AMBULATORY_CARE_PROVIDER_SITE_OTHER): Payer: Medicare Other

## 2017-08-12 ENCOUNTER — Encounter (HOSPITAL_COMMUNITY): Payer: Self-pay | Admitting: Internal Medicine

## 2017-08-12 DIAGNOSIS — R7302 Impaired glucose tolerance (oral): Secondary | ICD-10-CM | POA: Diagnosis not present

## 2017-08-12 DIAGNOSIS — E785 Hyperlipidemia, unspecified: Secondary | ICD-10-CM

## 2017-08-12 LAB — BASIC METABOLIC PANEL
BUN: 11 mg/dL (ref 6–23)
CO2: 37 mEq/L — ABNORMAL HIGH (ref 19–32)
Calcium: 8.6 mg/dL (ref 8.4–10.5)
Chloride: 101 mEq/L (ref 96–112)
Creatinine, Ser: 0.99 mg/dL (ref 0.40–1.20)
GFR: 57.95 mL/min — ABNORMAL LOW (ref >60.00)
Glucose, Bld: 70 mg/dL (ref 70–99)
POTASSIUM: 3.4 meq/L — AB (ref 3.5–5.1)
SODIUM: 144 meq/L (ref 135–145)

## 2017-08-12 LAB — HEPATIC FUNCTION PANEL
ALBUMIN: 3.4 g/dL — AB (ref 3.5–5.2)
ALT: 7 U/L (ref 0–35)
AST: 8 U/L (ref 0–37)
Alkaline Phosphatase: 51 U/L (ref 39–117)
Bilirubin, Direct: 0.1 mg/dL (ref 0.0–0.3)
TOTAL PROTEIN: 5.6 g/dL — AB (ref 6.0–8.3)
Total Bilirubin: 0.5 mg/dL (ref 0.2–1.2)

## 2017-08-12 LAB — LIPID PANEL
CHOLESTEROL: 183 mg/dL (ref 0–200)
HDL: 101.5 mg/dL (ref >39.00)
LDL CALC: 52 mg/dL (ref 0–99)
NonHDL: 81.84
TRIGLYCERIDES: 150 mg/dL — AB (ref 0.0–149.0)
Total CHOL/HDL Ratio: 2
VLDL: 30 mg/dL (ref 0.0–40.0)

## 2017-08-12 LAB — HEMOGLOBIN A1C: Hgb A1c MFr Bld: 5.6 % (ref 4.6–6.5)

## 2017-08-14 DIAGNOSIS — J449 Chronic obstructive pulmonary disease, unspecified: Secondary | ICD-10-CM | POA: Diagnosis not present

## 2017-08-15 ENCOUNTER — Ambulatory Visit (INDEPENDENT_AMBULATORY_CARE_PROVIDER_SITE_OTHER): Payer: Medicare Other | Admitting: Internal Medicine

## 2017-08-15 ENCOUNTER — Encounter: Payer: Self-pay | Admitting: Internal Medicine

## 2017-08-15 VITALS — BP 120/70 | HR 79 | Temp 98.6°F | Ht 66.0 in | Wt 122.0 lb

## 2017-08-15 DIAGNOSIS — E876 Hypokalemia: Secondary | ICD-10-CM | POA: Diagnosis not present

## 2017-08-15 DIAGNOSIS — R7302 Impaired glucose tolerance (oral): Secondary | ICD-10-CM

## 2017-08-15 DIAGNOSIS — I10 Essential (primary) hypertension: Secondary | ICD-10-CM

## 2017-08-15 DIAGNOSIS — E785 Hyperlipidemia, unspecified: Secondary | ICD-10-CM

## 2017-08-15 MED ORDER — POTASSIUM CHLORIDE ER 10 MEQ PO TBCR: 10.0000 meq | EXTENDED_RELEASE_TABLET | Freq: Every day | ORAL | 3 refills | Status: DC

## 2017-08-15 NOTE — Assessment & Plan Note (Signed)
stable overall by history and exam, recent data reviewed with pt, and pt to continue medical treatment as before,  to f/u any worsening symptoms or concerns  

## 2017-08-15 NOTE — Patient Instructions (Signed)
Please take all new medication as prescribed - the potassium medication  Please continue all other medications as before, and refills have been done if requested.  Please have the pharmacy call with any other refills you may need.  Please continue your efforts at being more active, low cholesterol diet, and weight control.  You are otherwise up to date with prevention measures today.  Please keep your appointments with your specialists as you may have planned  Please return in 6 months, or sooner if needed, with Lab testing done 3-5 days before

## 2017-08-15 NOTE — Assessment & Plan Note (Signed)
Lab Results  Component Value Date   LDLCALC 52 08/12/2017  stable overall by history and exam, recent data reviewed with pt, and pt to continue medical treatment as before,  to f/u any worsening symptoms or concerns

## 2017-08-15 NOTE — Progress Notes (Signed)
Subjective:    Patient ID: Alexis Higgins, female    DOB: 1940-12-13, 77 y.o.   MRN: [id number]  HPI  Here to f/u; overall doing ok,  Pt denies chest pain, increasing sob or doe, wheezing, orthopnea, PND, increased LE swelling, palpitations, dizziness or syncope.  Pt denies new neurological symptoms such as new headache, or facial or extremity weakness or numbness.  Pt denies polydipsia, polyuria, or low sugar episode.  Pt states overall good compliance with meds, mostly trying to follow appropriate diet, with wt overall stable, mostly sits during the day Wt Readings from Last 3 Encounters:  08/15/17 122 lb (55.3 kg)  08/10/17 120 lb (54.4 kg)  08/02/17 120 lb (54.4 kg)  Has had mild worsening depressive symptoms, but no suicidal ideation, or panic; but declines tx such as zoloft Past Medical History:  Diagnosis Date  . Abnormal chest x-ray    03/2012: will need OP f/u.  Marland Kitchen Anginal pain ([place])   . ANXIETY 01/01/2007  . Blood transfusion without reported diagnosis 2015  . BURSITIS, RIGHT HIP 06/04/2009  . CAD (coronary artery disease)    a. BMS to LAD 2010. b. NSTEMI with DES to LAD for ISR 2011. c. Patent stent 03/2012/Imdur added.  . Cataract   . CHEST PAIN-PRECORDIAL 01/15/2009  . Colon polyps    H/o tubular adenoma of colon  . COPD 01/01/2007   a. Chronic resp failure on home O2.  Marland Kitchen DEPRESSION 01/01/2007  . Eczema 01/08/2011  . GROIN PAIN 06/20/2008  . Headache(784.0) 01/01/2007  . Hemorrhoid   . HYPERTENSION 01/01/2007  . Impaired glucose tolerance 01/07/2011  . LOW BACK PAIN 01/01/2007  . Muscle weakness (generalized) 06/04/2009  . On home oxygen therapy    "2L; 24/7" (09/10/2016)  . OSTEOARTHRITIS, HIP 07/01/2008  . OSTEOPOROSIS 01/01/2007  . Oxygen deficiency    O2 2 liters 24/7  . Pneumonia 1998  . Rosacea 01/08/2011  . Shortness of breath   . Stroke [facility])    TIA  . SYNCOPE 01/01/2007  . Thoracic compression fracture ([place])   . TIA (transient ischemic attack)   . TRANSIENT  ISCHEMIC ATTACK, HX OF 01/01/2007   Past Surgical History:  Procedure Laterality Date  . ABDOMINAL HYSTERECTOMY    . APPENDECTOMY    . BACK SURGERY    . BREAST ENHANCEMENT SURGERY    . COLONOSCOPY  01/25/2002   tubular adenoma,hemorrhoids, hyperplastic  colon polyps  . COLONOSCOPY  02/17/2005   hemorrhoids  . CORONARY ANGIOPLASTY    . CORONARY STENT PLACEMENT    . ESOPHAGOGASTRODUODENOSCOPY (EGD) WITH PROPOFOL N/A 08/10/2017   Procedure: ESOPHAGOGASTRODUODENOSCOPY (EGD) WITH PROPOFOL;  Surgeon: Gatha Mayer, MD;  [facility];  Service: Endoscopy;  Laterality: N/A;  . HIP ARTHROPLASTY Left 10/01/2013   Procedure: ARTHROPLASTY UNIPOLAR   HIP;  Surgeon: Marianna Payment, MD;  Location: [place];  Service: Orthopedics;  Laterality: Left;  . HIP SURGERY Left    DR XU     PROXIMAL NECK   . IR GENERIC HISTORICAL  02/19/2016   IR VERTEBROPLASTY CERV/THOR BX INC UNI/BIL INC/INJECT/IMAGING 02/19/2016 Luanne Bras, MD [facility]-INTER[facility] RAD  . IR GENERIC HISTORICAL  07/15/2016   IR KYPHO THORACIC WITH BONE BIOPSY 07/15/2016 Luanne Bras, MD [facility]  . IR KYPHO EA ADDL LEVEL THORACIC OR LUMBAR  09/14/2016  . IR RADIOLOGIST EVAL & MGMT  09/22/2016  . IR VERTEBROPLASTY CERV/THOR BX INC UNI/BIL INC/INJECT/IMAGING  10/08/2016  . IR VERTEBROPLASTY CERV/THOR BX INC UNI/BIL INC/INJECT/IMAGING  11/16/2016  . LEFT HEART CATHETERIZATION WITH CORONARY ANGIOGRAM N/A 04/13/2012   Procedure: LEFT HEART CATHETERIZATION WITH CORONARY ANGIOGRAM;  Surgeon: Burnell Blanks, MD;  Location: [facility] CATH LAB;  Service: Cardiovascular;  Laterality: N/A;  . OOPHORECTOMY     one ovary  . POLYPECTOMY    . s/p bilat cataract  2010  . sp lumbar disc surgury     [name]    reports that she quit smoking about 12 years ago. Her smoking use included cigarettes. She has a 76.50 pack-year smoking history. She has never used smokeless tobacco. She reports that she does not drink alcohol or use drugs. family  history includes Cardiomyopathy in her sister; Emphysema in her sister; Heart attack in her sister; Heart disease in her sister; Osteoporosis in her mother; Seizures in her sister. Allergies  Allergen Reactions  . Aspirin Other (See Comments)     cns bleed risk  . Codeine Anaphylaxis and Rash  . Other Other (See Comments)    Stiolto - severe reaction - caused inability to breath   Current Outpatient Medications on File Prior to Visit  Medication Sig Dispense Refill  . acetaminophen (TYLENOL) 500 MG tablet Take 1,000 mg by mouth every 6 (six) hours as needed for moderate pain or headache.     . albuterol (PROVENTIL) (2.5 MG/3ML) 0.083% nebulizer solution Take 3 mLs (2.5 mg total) by nebulization every 4 (four) hours as needed for wheezing or shortness of breath. 360 mL 5  . Cholecalciferol (VITAMIN D) 1000 UNITS capsule Take 1,000 Units by mouth every evening.     . clopidogrel (PLAVIX) 75 MG tablet Take 1 tablet (75 mg total) by mouth daily. 90 tablet 3  . docusate sodium (COLACE) 100 MG capsule Take 100 mg by mouth daily as needed for mild constipation.     . furosemide (LASIX) 20 MG tablet Take 1 tablet (20 mg total) by mouth 2 (two) times daily as needed. 180 tablet 3  . hydrOXYzine (ATARAX/VISTARIL) 10 MG tablet Take 1 tablet (10 mg total) by mouth 3 (three) times daily as needed. For itching or nervousness 90 tablet 2  . isosorbide mononitrate (IMDUR) 30 MG 24 hr tablet TAKE 0.5 TABLETS (15 MG TOTAL) BY MOUTH DAILY. 45 tablet 3  . Lidocaine HCl (PAIN RELIEF ROLL-ON EX) Apply 1 application topically 2 (two) times daily as needed (for pain).     Marland Kitchen lovastatin (MEVACOR) 20 MG tablet TAKE 1 TABLET BY MOUTH AT BEDTIME (Patient taking differently: TAKE 20 MG BY MOUTH AT BEDTIME) 90 tablet 3  . metoprolol tartrate (LOPRESSOR) 25 MG tablet TAKE 1 TABLET BY MOUTH TWICE A DAY (Patient taking differently: Take 25 mg by mouth 2 (two) times daily. ) 180 tablet 3  . nitroGLYCERIN (NITROSTAT) 0.4 MG SL  tablet Place 1 tablet (0.4 mg total) under the tongue every 5 (five) minutes as needed for chest pain (up to 3 doses). 25 tablet 4  . ondansetron (ZOFRAN) 4 MG tablet Take 1 tablet (4 mg total) by mouth every 8 (eight) hours as needed for nausea or vomiting. 11 tablet 0  . OXYGEN Inhale 2 L into the lungs continuous.     . polyethylene glycol (MIRALAX / GLYCOLAX) packet Take 17 g by mouth daily as needed for mild constipation.    . polyvinyl alcohol (LIQUIFILM TEARS) 1.4 % ophthalmic solution Place 1 drop into both eyes daily as needed for dry eyes.     . predniSONE (DELTASONE) 10 MG tablet TAKE 1  1/2 TABLET BY MOUTH DAILY WITH BREAKFAST (Patient taking differently: TAKE 15 MG BY MOUTH DAILY WITH BREAKFAST) 45 tablet 5  . PROAIR HFA 108 (90 Base) MCG/ACT inhaler INHALE 2 PUFFS INTO THE LUNGS EVERY 6 (SIX) HOURS AS NEEDED FOR WHEEZING OR SHORTNESS OF BREATH. 25.5 Inhaler 2  . Simethicone 125 MG TABS 1 before meals and at bedtime 90 tablet   . tiotropium (SPIRIVA HANDIHALER) 18 MCG inhalation capsule Place 1 capsule (18 mcg total) into inhaler and inhale daily at 6 (six) AM. 90 capsule 3  . traMADol (ULTRAM) 50 MG tablet Take 100 mg by mouth every 6 (six) hours as needed for moderate pain.     . traZODone (DESYREL) 50 MG tablet Take 50 mg by mouth at bedtime.    . vitamin B-12 (CYANOCOBALAMIN) 1000 MCG tablet Take 1,000 mcg by mouth daily.    . vitamin C (ASCORBIC ACID) 500 MG tablet Take 500 mg by mouth 2 (two) times daily.     No current facility-administered medications on file prior to visit.    Review of Systems  Constitutional: Negative for other unusual diaphoresis or sweats HENT: Negative for ear discharge or swelling Eyes: Negative for other worsening visual disturbances Respiratory: Negative for stridor or other swelling  Gastrointestinal: Negative for worsening distension or other blood Genitourinary: Negative for retention or other urinary change Musculoskeletal: Negative for other  MSK pain or swelling Skin: Negative for color change or other new lesions Neurological: Negative for worsening tremors and other numbness  Psychiatric/Behavioral: Negative for worsening agitation or other fatigue All other system neg per pt    Objective:   Physical Exam BP 120/70   Pulse 79   Temp 98.6 F (37 C) (Oral)   Ht 5\' 6"  (1.676 m)   Wt 122 lb (55.3 kg)   SpO2 94%   BMI 19.69 kg/m  VS noted, on home o2 Constitutional: Pt appears in NAD HENT: Head: NCAT.  Right Ear: External ear normal.  Left Ear: External ear normal.  Eyes: . Pupils are equal, round, and reactive to light. Conjunctivae and EOM are normal Nose: without d/c or deformity Neck: Neck supple. Gross normal ROM Cardiovascular: Normal rate and regular rhythm.   Pulmonary/Chest: Effort normal and breath sounds without rales or wheezing.  Abd:  Soft, NT, ND, + BS, no organomegaly Neurological: Pt is alert. At baseline orientation, motor grossly intact Skin: Skin is warm. No rashes, other new lesions Psychiatric: Pt behavior is normal without agitation  trace swelling to LLE/pedal  Lab Results  Component Value Date   WBC 11.7 (H) 03/08/2017   HGB 13.0 03/08/2017   HCT 40.3 03/08/2017   PLT 259.0 03/08/2017   GLUCOSE 70 08/12/2017   CHOL 183 08/12/2017   TRIG 150.0 (H) 08/12/2017   HDL 101.50 08/12/2017   LDLDIRECT 49.0 02/05/2016   LDLCALC 52 08/12/2017   ALT 7 08/12/2017   AST 8 08/12/2017   NA 144 08/12/2017   K 3.4 (L) 08/12/2017   CL 101 08/12/2017   CREATININE 0.99 08/12/2017   BUN 11 08/12/2017   CO2 37 (H) 08/12/2017   TSH 2.73 03/08/2017   INR 0.87 11/15/2016   HGBA1C 5.6 08/12/2017      Assessment & Plan:

## 2017-08-15 NOTE — Assessment & Plan Note (Signed)
Lab Results  Component Value Date   HGBA1C 5.6 08/12/2017  stable overall by history and exam, recent data reviewed with pt, and pt to continue medical treatment as before,  to f/u any worsening symptoms or concerns

## 2017-08-15 NOTE — Assessment & Plan Note (Signed)
Mild due to chronic diuretic, for Kdur 10 qd

## 2017-08-17 DIAGNOSIS — J449 Chronic obstructive pulmonary disease, unspecified: Secondary | ICD-10-CM | POA: Diagnosis not present

## 2017-08-22 ENCOUNTER — Encounter (HOSPITAL_COMMUNITY): Payer: Medicare Other

## 2017-08-24 ENCOUNTER — Ambulatory Visit: Payer: Medicare Other | Admitting: Internal Medicine

## 2017-08-26 ENCOUNTER — Ambulatory Visit (HOSPITAL_COMMUNITY)
Admission: RE | Admit: 2017-08-26 | Discharge: 2017-08-26 | Disposition: A | Discharge status: Home / Self Care | Payer: Medicare Other | Source: Ambulatory Visit | Attending: Cardiovascular Disease | Admitting: Cardiovascular Disease

## 2017-08-26 DIAGNOSIS — L819 Disorder of pigmentation, unspecified: Secondary | ICD-10-CM | POA: Diagnosis not present

## 2017-09-05 ENCOUNTER — Telehealth: Payer: Self-pay | Admitting: Internal Medicine

## 2017-09-05 ENCOUNTER — Ambulatory Visit: Payer: Medicare Other | Admitting: Internal Medicine

## 2017-09-05 ENCOUNTER — Encounter: Payer: Self-pay | Admitting: Internal Medicine

## 2017-09-05 ENCOUNTER — Telehealth: Payer: Self-pay | Admitting: *Deleted

## 2017-09-05 VITALS — BP 104/60 | HR 76 | Ht 66.0 in | Wt 120.1 lb

## 2017-09-05 DIAGNOSIS — K5909 Other constipation: Secondary | ICD-10-CM

## 2017-09-05 DIAGNOSIS — R140 Abdominal distension (gaseous): Secondary | ICD-10-CM | POA: Diagnosis not present

## 2017-09-05 DIAGNOSIS — R1013 Epigastric pain: Secondary | ICD-10-CM

## 2017-09-05 MED ORDER — METOCLOPRAMIDE HCL 5 MG PO TABS: 5.0000 mg | ORAL_TABLET | Freq: Three times a day (TID) | ORAL | 0 refills | Status: DC

## 2017-09-05 NOTE — Progress Notes (Signed)
LVM for patient to call back regarding results of ABI/TBI

## 2017-09-05 NOTE — Progress Notes (Signed)
Alexis Higgins 77 y.o. [birth year] [id number]  Assessment & Plan:   Encounter Diagnoses  Name Primary?  . Dyspepsia Yes  . Bloating   . Chronic constipation    Trial of Reglan 5 mg q. before meals.  Side effects discussed.  I have explained the side effects and the potential risks of taking long-term especially. She is quite bothered so I think it is worth trying this medication.  Stop Gas-X, liberalize carbonated beverages i.e. may drink Coke again use a step 3 gastroparesis diet otherwise  I appreciate the opportunity to care for this patient. CC: Biagio Borg, MD   Subjective:   Chief Complaint: Left upper quadrant pain   HPI The patient is here with her husband with persistent postprandial left upper quadrant discomfort.  I had performed an unremarkable EGD the other month.  I thought perhaps she was trapping air and gas and suggested she try Gas-X.  She stopped carbonated beverages.  She does not feel any different.  Chronic constipation is adequately treated and unchanged.  Wt Readings from Last 3 Encounters:  09/05/17 120 lb 2 oz (54.5 kg)  08/15/17 122 lb (55.3 kg)  08/10/17 120 lb (54.4 kg)   CT scanning of the abdomen and pelvis has not revealed a significant problem related to her symptoms.  Trial 5 mg before meals Allergies  Allergen Reactions  . Aspirin Other (See Comments)     cns bleed risk  . Codeine Anaphylaxis and Rash  . Other Other (See Comments)    Stiolto - severe reaction - caused inability to breath   Current Meds  Medication Sig  . acetaminophen (TYLENOL) 500 MG tablet Take 1,000 mg by mouth every 6 (six) hours as needed for moderate pain or headache.   . Cholecalciferol (VITAMIN D) 1000 UNITS capsule Take 1,000 Units by mouth every evening.   . clopidogrel (PLAVIX) 75 MG tablet Take 1 tablet (75 mg total) by mouth daily.  Marland Kitchen docusate sodium (COLACE) 100 MG capsule Take 100 mg by mouth daily as needed for mild constipation.   . furosemide  (LASIX) 20 MG tablet Take 1 tablet (20 mg total) by mouth 2 (two) times daily as needed.  . hydrOXYzine (ATARAX/VISTARIL) 10 MG tablet Take 1 tablet (10 mg total) by mouth 3 (three) times daily as needed. For itching or nervousness  . isosorbide mononitrate (IMDUR) 30 MG 24 hr tablet TAKE 0.5 TABLETS (15 MG TOTAL) BY MOUTH DAILY.  Marland Kitchen Lidocaine HCl (PAIN RELIEF ROLL-ON EX) Apply 1 application topically 2 (two) times daily as needed (for pain).   Marland Kitchen lovastatin (MEVACOR) 20 MG tablet TAKE 1 TABLET BY MOUTH AT BEDTIME (Patient taking differently: TAKE 20 MG BY MOUTH AT BEDTIME)  . metoprolol tartrate (LOPRESSOR) 25 MG tablet TAKE 1 TABLET BY MOUTH TWICE A DAY (Patient taking differently: Take 25 mg by mouth 2 (two) times daily. )  . nitroGLYCERIN (NITROSTAT) 0.4 MG SL tablet Place 1 tablet (0.4 mg total) under the tongue every 5 (five) minutes as needed for chest pain (up to 3 doses).  . ondansetron (ZOFRAN) 4 MG tablet Take 1 tablet (4 mg total) by mouth every 8 (eight) hours as needed for nausea or vomiting.  . OXYGEN Inhale 2 L into the lungs continuous.   . polyethylene glycol (MIRALAX / GLYCOLAX) packet Take 17 g by mouth daily as needed for mild constipation.  . polyvinyl alcohol (LIQUIFILM TEARS) 1.4 % ophthalmic solution Place 1 drop into both eyes daily as needed  for dry eyes.   . potassium chloride (K-DUR) 10 MEQ tablet Take 1 tablet (10 mEq total) by mouth daily.  . predniSONE (DELTASONE) 10 MG tablet TAKE 1 1/2 TABLET BY MOUTH DAILY WITH BREAKFAST (Patient taking differently: TAKE 15 MG BY MOUTH DAILY WITH BREAKFAST)  . PROAIR HFA 108 (90 Base) MCG/ACT inhaler INHALE 2 PUFFS INTO THE LUNGS EVERY 6 (SIX) HOURS AS NEEDED FOR WHEEZING OR SHORTNESS OF BREATH.  . Simethicone 125 MG TABS 1 before meals and at bedtime  . tiotropium (SPIRIVA HANDIHALER) 18 MCG inhalation capsule Place 1 capsule (18 mcg total) into inhaler and inhale daily at 6 (six) AM.  . traMADol (ULTRAM) 50 MG tablet Take 100 mg by  mouth every 6 (six) hours as needed for moderate pain.   . traZODone (DESYREL) 50 MG tablet Take 50 mg by mouth at bedtime.  . vitamin B-12 (CYANOCOBALAMIN) 1000 MCG tablet Take 1,000 mcg by mouth daily.  . vitamin C (ASCORBIC ACID) 500 MG tablet Take 500 mg by mouth 2 (two) times daily.   Past Medical History:  Diagnosis Date  . Abnormal chest x-ray    03/2012: will need OP f/u.  Marland Kitchen Anginal pain ([place])   . ANXIETY 01/01/2007  . Blood transfusion without reported diagnosis 2015  . BURSITIS, RIGHT HIP 06/04/2009  . CAD (coronary artery disease)    a. BMS to LAD 2010. b. NSTEMI with DES to LAD for ISR 2011. c. Patent stent 03/2012/Imdur added.  . Cataract   . CHEST PAIN-PRECORDIAL 01/15/2009  . Colon polyps    H/o tubular adenoma of colon  . COPD 01/01/2007   a. Chronic resp failure on home O2.  Marland Kitchen DEPRESSION 01/01/2007  . Eczema 01/08/2011  . GROIN PAIN 06/20/2008  . Headache(784.0) 01/01/2007  . Hemorrhoid   . HYPERTENSION 01/01/2007  . Impaired glucose tolerance 01/07/2011  . LOW BACK PAIN 01/01/2007  . Muscle weakness (generalized) 06/04/2009  . On home oxygen therapy    "2L; 24/7" (09/10/2016)  . OSTEOARTHRITIS, HIP 07/01/2008  . OSTEOPOROSIS 01/01/2007  . Oxygen deficiency    O2 2 liters 24/7  . Pneumonia 1998  . Rosacea 01/08/2011  . Shortness of breath   . Stroke [facility])    TIA  . SYNCOPE 01/01/2007  . Thoracic compression fracture ([place])   . TIA (transient ischemic attack)   . TRANSIENT ISCHEMIC ATTACK, HX OF 01/01/2007   Past Surgical History:  Procedure Laterality Date  . ABDOMINAL HYSTERECTOMY    . APPENDECTOMY    . BACK SURGERY    . BREAST ENHANCEMENT SURGERY    . COLONOSCOPY  01/25/2002   tubular adenoma,hemorrhoids, hyperplastic  colon polyps  . COLONOSCOPY  02/17/2005   hemorrhoids  . CORONARY ANGIOPLASTY    . CORONARY STENT PLACEMENT    . ESOPHAGOGASTRODUODENOSCOPY (EGD) WITH PROPOFOL N/A 08/10/2017   Procedure: ESOPHAGOGASTRODUODENOSCOPY (EGD) WITH PROPOFOL;  Surgeon:  Gatha Mayer, MD;  [facility];  Service: Endoscopy;  Laterality: N/A;  . HIP ARTHROPLASTY Left 10/01/2013   Procedure: ARTHROPLASTY UNIPOLAR   HIP;  Surgeon: Marianna Payment, MD;  Location: [place];  Service: Orthopedics;  Laterality: Left;  . HIP SURGERY Left    DR XU     PROXIMAL NECK   . IR GENERIC HISTORICAL  02/19/2016   IR VERTEBROPLASTY CERV/THOR BX INC UNI/BIL INC/INJECT/IMAGING 02/19/2016 Luanne Bras, MD [facility]INTER[facility]  . IR GENERIC HISTORICAL  07/15/2016   IR KYPHO THORACIC WITH BONE BIOPSY 07/15/2016 Luanne Bras, MD [facility]  .  IR KYPHO EA ADDL LEVEL THORACIC OR LUMBAR  09/14/2016  . IR RADIOLOGIST EVAL & MGMT  09/22/2016  . IR VERTEBROPLASTY CERV/THOR BX INC UNI/BIL INC/INJECT/IMAGING  10/08/2016  . IR VERTEBROPLASTY CERV/THOR BX INC UNI/BIL INC/INJECT/IMAGING  11/16/2016  . LEFT HEART CATHETERIZATION WITH CORONARY ANGIOGRAM N/A 04/13/2012   Procedure: LEFT HEART CATHETERIZATION WITH CORONARY ANGIOGRAM;  Surgeon: Burnell Blanks, MD;  Location: [facility] CATH LAB;  Service: Cardiovascular;  Laterality: N/A;  . OOPHORECTOMY     one ovary  . s/p bilat cataract  2010  . sp lumbar disc surgury     [name]   Social History   Social History Narrative   Married   Disabled   Former smoker, no current tobacco EtOH or drugs   family history includes Cardiomyopathy in her sister; Emphysema in her sister; Heart attack in her sister; Heart disease in her sister; Osteoporosis in her mother; Seizures in her sister.   Review of Systems PPI  Objective:   Physical Exam BP 104/60 (BP Location: Left Arm, Patient Position: Sitting, Cuff Size: Normal)   Pulse 76   Ht 5\' 6"  (1.676 m)   Wt 120 lb 2 oz (54.5 kg)   BMI 19.39 kg/m  Chronicall ill on O2 Otherwise no acute distress Eyes are anicteric Appropriate mood and affect and alert and oriented x3  15 minutes time spent with patient > half in counseling coordination of care

## 2017-09-05 NOTE — Telephone Encounter (Signed)
I double checked with Dr Carlean Purl and the generic reglan we sent in today is for the delayed stomach emptying not her nausea so okay to fill the rx.

## 2017-09-05 NOTE — Patient Instructions (Signed)
  Stop your Gas X per Dr Carlean Purl.   We have sent the following medications to your pharmacy for you to pick up at your convenience: Reglan Take this for 3-4 weeks and call us with an update on how your doing.   It is okay to drink Coke per Dr Carlean Purl.   We are giving you a gastroparesis diet handout to read and follow step #3.    I appreciate the opportunity to care for you. Silvano Rusk, MD, [facility]

## 2017-09-05 NOTE — Telephone Encounter (Signed)
I informed pt's husband, Ed of Dr. Leigh Aurora review of results.

## 2017-09-05 NOTE — Telephone Encounter (Signed)
-----   Message from Trula Slade, DPM sent at 09/02/2017  4:44 PM EDT ----- Please let her know that the circulation to her feet is about the same as it was previously. There is decreased circulation to the actual toes, but this is unchanged compared to previous exam. Will continue to monitor.

## 2017-09-08 ENCOUNTER — Telehealth: Payer: Self-pay | Admitting: Internal Medicine

## 2017-09-08 NOTE — Telephone Encounter (Signed)
This is an adverse reaction to the metaclopramide - so STOP as you told her  I will think about next steps and advise

## 2017-09-08 NOTE — Telephone Encounter (Signed)
Patient husband states patient is having side affects from medication reglan including abd pain and swollen tongue. Patient husband wanting to know what to do.

## 2017-09-08 NOTE — Telephone Encounter (Signed)
Spoke with pts husband and he is aware and knows she is not to take any more of the reglan.

## 2017-09-08 NOTE — Telephone Encounter (Signed)
Pts husband states pt took reglan 3 times yesterday and her stomach hurt more than it has. This am she took the reglan and said her tongue felt funny. She looked in the mirror and it appeared swollen. The swelling has gone down now. Husband wanted to know if they need to keep taking it and I instructed them to not take any more until he heard back from our office. Please advise.

## 2017-09-14 DIAGNOSIS — J449 Chronic obstructive pulmonary disease, unspecified: Secondary | ICD-10-CM | POA: Diagnosis not present

## 2017-09-17 DIAGNOSIS — J449 Chronic obstructive pulmonary disease, unspecified: Secondary | ICD-10-CM | POA: Diagnosis not present

## 2017-09-20 NOTE — Telephone Encounter (Signed)
Left message that I was trying to reach them

## 2017-09-21 ENCOUNTER — Ambulatory Visit (INDEPENDENT_AMBULATORY_CARE_PROVIDER_SITE_OTHER): Payer: Medicare Other | Admitting: Internal Medicine

## 2017-09-21 ENCOUNTER — Encounter: Payer: Self-pay | Admitting: Internal Medicine

## 2017-09-21 VITALS — BP 122/74 | HR 77 | Temp 97.8°F | Ht 66.0 in | Wt 119.0 lb

## 2017-09-21 DIAGNOSIS — W57XXXS Bitten or stung by nonvenomous insect and other nonvenomous arthropods, sequela: Secondary | ICD-10-CM

## 2017-09-21 DIAGNOSIS — F22 Delusional disorders: Secondary | ICD-10-CM | POA: Diagnosis not present

## 2017-09-21 DIAGNOSIS — J9611 Chronic respiratory failure with hypoxia: Secondary | ICD-10-CM | POA: Diagnosis not present

## 2017-09-21 DIAGNOSIS — J439 Emphysema, unspecified: Secondary | ICD-10-CM

## 2017-09-21 DIAGNOSIS — R7302 Impaired glucose tolerance (oral): Secondary | ICD-10-CM

## 2017-09-21 MED ORDER — HYDROXYZINE HCL 10 MG PO TABS: 10.0000 mg | ORAL_TABLET | Freq: Three times a day (TID) | ORAL | 2 refills | Status: DC | PRN

## 2017-09-21 NOTE — Patient Instructions (Signed)
You will be contacted regarding the referral for: infectious disease  Please call if you would want to try the medication called seroquel that we mentioned, or be referred to Psychiatry  Your order for the wheelchair was sent to the [facility]  Please continue all other medications as before, and refills have been done if requested.  Please have the pharmacy call with any other refills you may need.  Please keep your appointments with your specialists as you may have planned

## 2017-09-21 NOTE — Assessment & Plan Note (Signed)
stable overall by history and exam, and pt to continue medical treatment as before,  to f/u any worsening symptoms or concerns 

## 2017-09-21 NOTE — Assessment & Plan Note (Signed)
Lab Results  Component Value Date   HGBA1C 5.6 08/12/2017  stable overall by history and exam, recent data reviewed with pt, and pt to continue medical treatment as before,  to f/u any worsening symptoms or concerns

## 2017-09-21 NOTE — Assessment & Plan Note (Signed)
[place] for ID consult but insect complaint has not been adequately verified

## 2017-09-21 NOTE — Assessment & Plan Note (Signed)
Pt adamant in refusal of psychiatric referral or seroquel tx

## 2017-09-21 NOTE — Progress Notes (Signed)
Subjective:    Patient ID: Alexis Higgins, female    DOB: Feb 16, 1941, 77 y.o.   MRN: [id number]  HPI  Here to f/u, again convinced she has insects coming out of her skin, mostly at the forearms, for many months now with itching and is just miserable.  Has been seen by 2 dermatologists without improvement after the first recommended tx for possible scabies that unfortunately did not help, and had no specific recommendations from the second.  Husband has stated previously he has never seen the insects, though today he appears to consent to agree with her that he did pull out some type of insect from the right forearm once (not clear if he is just agreeing with her to keep the peace).  Pt admits she had taken 2 small things she thought were insects to the first dermatologist and was told one was a piece of lint, and the other not an insect.  She is adamant today despite prior counseling that she is not delusional, and husband is begging for anything else to be done, specifically ID referral  Also needs new wheelchair Past Medical History:  Diagnosis Date  . Abnormal chest x-ray    03/2012: will need OP f/u.  Marland Kitchen Anginal pain ([place])   . ANXIETY 01/01/2007  . Blood transfusion without reported diagnosis 2015  . BURSITIS, RIGHT HIP 06/04/2009  . CAD (coronary artery disease)    a. BMS to LAD 2010. b. NSTEMI with DES to LAD for ISR 2011. c. Patent stent 03/2012/Imdur added.  . Cataract   . CHEST PAIN-PRECORDIAL 01/15/2009  . Colon polyps    H/o tubular adenoma of colon  . COPD 01/01/2007   a. Chronic resp failure on home O2.  Marland Kitchen DEPRESSION 01/01/2007  . Eczema 01/08/2011  . GROIN PAIN 06/20/2008  . Headache(784.0) 01/01/2007  . Hemorrhoid   . HYPERTENSION 01/01/2007  . Impaired glucose tolerance 01/07/2011  . LOW BACK PAIN 01/01/2007  . Muscle weakness (generalized) 06/04/2009  . On home oxygen therapy    "2L; 24/7" (09/10/2016)  . OSTEOARTHRITIS, HIP 07/01/2008  . OSTEOPOROSIS 01/01/2007  . Oxygen  deficiency    O2 2 liters 24/7  . Pneumonia 1998  . Rosacea 01/08/2011  . Shortness of breath   . Stroke [facility])    TIA  . SYNCOPE 01/01/2007  . Thoracic compression fracture ([place])   . TIA (transient ischemic attack)   . TRANSIENT ISCHEMIC ATTACK, HX OF 01/01/2007   Past Surgical History:  Procedure Laterality Date  . ABDOMINAL HYSTERECTOMY    . APPENDECTOMY    . BACK SURGERY    . BREAST ENHANCEMENT SURGERY    . COLONOSCOPY  01/25/2002   tubular adenoma,hemorrhoids, hyperplastic  colon polyps  . COLONOSCOPY  02/17/2005   hemorrhoids  . CORONARY ANGIOPLASTY    . CORONARY STENT PLACEMENT    . ESOPHAGOGASTRODUODENOSCOPY (EGD) WITH PROPOFOL N/A 08/10/2017   Procedure: ESOPHAGOGASTRODUODENOSCOPY (EGD) WITH PROPOFOL;  Surgeon: Gatha Mayer, MD;  Location: [facility];  Service: Endoscopy;  Laterality: N/A;  . HIP ARTHROPLASTY Left 10/01/2013   Procedure: ARTHROPLASTY UNIPOLAR   HIP;  Surgeon: Marianna Payment, MD;  Location: [facility];  Service: Orthopedics;  Laterality: Left;  . HIP SURGERY Left    DR XU     PROXIMAL NECK   . IR GENERIC HISTORICAL  02/19/2016   IR VERTEBROPLASTY CERV/THOR BX INC UNI/BIL INC/INJECT/IMAGING 02/19/2016 Luanne Bras, MD [facility]  . IR GENERIC HISTORICAL  07/15/2016   IR KYPHO  THORACIC WITH BONE BIOPSY 07/15/2016 Luanne Bras, MD [facility]  . IR KYPHO EA ADDL LEVEL THORACIC OR LUMBAR  09/14/2016  . IR RADIOLOGIST EVAL & MGMT  09/22/2016  . IR VERTEBROPLASTY CERV/THOR BX INC UNI/BIL INC/INJECT/IMAGING  10/08/2016  . IR VERTEBROPLASTY CERV/THOR BX INC UNI/BIL INC/INJECT/IMAGING  11/16/2016  . LEFT HEART CATHETERIZATION WITH CORONARY ANGIOGRAM N/A 04/13/2012   Procedure: LEFT HEART CATHETERIZATION WITH CORONARY ANGIOGRAM;  Surgeon: Burnell Blanks, MD;  Location: [facility];  Service: Cardiovascular;  Laterality: N/A;  . OOPHORECTOMY     one ovary  . s/p bilat cataract  2010  . sp lumbar disc surgury     [name]    reports that she  quit smoking about 12 years ago. Her smoking use included cigarettes. She has a 76.50 pack-year smoking history. She has never used smokeless tobacco. She reports that she does not drink alcohol or use drugs. family history includes Cardiomyopathy in her sister; Emphysema in her sister; Heart attack in her sister; Heart disease in her sister; Osteoporosis in her mother; Seizures in her sister. Allergies  Allergen Reactions  . Aspirin Other (See Comments)     cns bleed risk  . Codeine Anaphylaxis and Rash  . Other Other (See Comments)    Stiolto - severe reaction - caused inability to breath   Current Outpatient Medications on File Prior to Visit  Medication Sig Dispense Refill  . acetaminophen (TYLENOL) 500 MG tablet Take 1,000 mg by mouth every 6 (six) hours as needed for moderate pain or headache.     . Cholecalciferol (VITAMIN D) 1000 UNITS capsule Take 1,000 Units by mouth every evening.     . clopidogrel (PLAVIX) 75 MG tablet Take 1 tablet (75 mg total) by mouth daily. 90 tablet 3  . docusate sodium (COLACE) 100 MG capsule Take 100 mg by mouth daily as needed for mild constipation.     . furosemide (LASIX) 20 MG tablet Take 1 tablet (20 mg total) by mouth 2 (two) times daily as needed. 180 tablet 3  . isosorbide mononitrate (IMDUR) 30 MG 24 hr tablet TAKE 0.5 TABLETS (15 MG TOTAL) BY MOUTH DAILY. 45 tablet 3  . Lidocaine HCl (PAIN RELIEF ROLL-ON EX) Apply 1 application topically 2 (two) times daily as needed (for pain).     Marland Kitchen lovastatin (MEVACOR) 20 MG tablet TAKE 1 TABLET BY MOUTH AT BEDTIME (Patient taking differently: TAKE 20 MG BY MOUTH AT BEDTIME) 90 tablet 3  . metoprolol tartrate (LOPRESSOR) 25 MG tablet TAKE 1 TABLET BY MOUTH TWICE A DAY (Patient taking differently: Take 25 mg by mouth 2 (two) times daily. ) 180 tablet 3  . nitroGLYCERIN (NITROSTAT) 0.4 MG SL tablet Place 1 tablet (0.4 mg total) under the tongue every 5 (five) minutes as needed for chest pain (up to 3 doses). 25  tablet 4  . ondansetron (ZOFRAN) 4 MG tablet Take 1 tablet (4 mg total) by mouth every 8 (eight) hours as needed for nausea or vomiting. 11 tablet 0  . OXYGEN Inhale 2 L into the lungs continuous.     . polyethylene glycol (MIRALAX / GLYCOLAX) packet Take 17 g by mouth daily as needed for mild constipation.    . polyvinyl alcohol (LIQUIFILM TEARS) 1.4 % ophthalmic solution Place 1 drop into both eyes daily as needed for dry eyes.     . potassium chloride (K-DUR) 10 MEQ tablet Take 1 tablet (10 mEq total) by mouth daily. 90 tablet 3  .  predniSONE (DELTASONE) 10 MG tablet TAKE 1 1/2 TABLET BY MOUTH DAILY WITH BREAKFAST (Patient taking differently: TAKE 15 MG BY MOUTH DAILY WITH BREAKFAST) 45 tablet 5  . PROAIR HFA 108 (90 Base) MCG/ACT inhaler INHALE 2 PUFFS INTO THE LUNGS EVERY 6 (SIX) HOURS AS NEEDED FOR WHEEZING OR SHORTNESS OF BREATH. 25.5 Inhaler 2  . tiotropium (SPIRIVA HANDIHALER) 18 MCG inhalation capsule Place 1 capsule (18 mcg total) into inhaler and inhale daily at 6 (six) AM. 90 capsule 3  . traMADol (ULTRAM) 50 MG tablet Take 100 mg by mouth every 6 (six) hours as needed for moderate pain.     . traZODone (DESYREL) 50 MG tablet Take 50 mg by mouth at bedtime.    . vitamin B-12 (CYANOCOBALAMIN) 1000 MCG tablet Take 1,000 mcg by mouth daily.    . vitamin C (ASCORBIC ACID) 500 MG tablet Take 500 mg by mouth 2 (two) times daily.     No current facility-administered medications on file prior to visit.    Review of Systems  Constitutional: Negative for other unusual diaphoresis or sweats HENT: Negative for ear discharge or swelling Eyes: Negative for other worsening visual disturbances Respiratory: Negative for stridor or other swelling  Gastrointestinal: Negative for worsening distension or other blood Genitourinary: Negative for retention or other urinary change Musculoskeletal: Negative for other MSK pain or swelling Skin: Negative for color change or other new lesions Neurological:  Negative for worsening tremors and other numbness  Psychiatric/Behavioral: Negative for worsening agitation or other fatigue All other system neg per pt    Objective:   Physical Exam BP 122/74   Pulse 77   Temp 97.8 F (36.6 C) (Oral)   Ht 5\' 6"  (1.676 m)   Wt 119 lb (54 kg)   SpO2 96%   BMI 19.21 kg/m  VS noted, chronic ill appearing in transport chair, on home o2 Constitutional: Pt appears in NAD HENT: Head: NCAT.  Right Ear: External ear normal.  Left Ear: External ear normal.  Eyes: . Pupils are equal, round, and reactive to light. Conjunctivae and EOM are normal Nose: without d/c or deformity Neck: Neck supple. Gross normal ROM Cardiovascular: Normal rate and regular rhythm.   Pulmonary/Chest: Effort normal and breath sounds without rales or wheezing.  Neurological: Pt is alert. At baseline orientation, motor grossly intact Skin: Skin is warm. No rashes, other new lesions, no insect noted Psychiatric: Pt behavior is normal without agitation but with persistent statements regarding skin insects No other exam findings     Assessment & Plan:

## 2017-09-29 DIAGNOSIS — M961 Postlaminectomy syndrome, not elsewhere classified: Secondary | ICD-10-CM | POA: Diagnosis not present

## 2017-09-29 DIAGNOSIS — G4700 Insomnia, unspecified: Secondary | ICD-10-CM | POA: Diagnosis not present

## 2017-09-29 DIAGNOSIS — G894 Chronic pain syndrome: Secondary | ICD-10-CM | POA: Diagnosis not present

## 2017-09-29 DIAGNOSIS — M47817 Spondylosis without myelopathy or radiculopathy, lumbosacral region: Secondary | ICD-10-CM | POA: Diagnosis not present

## 2017-10-06 ENCOUNTER — Ambulatory Visit: Payer: Medicare Other | Admitting: Podiatry

## 2017-10-06 DIAGNOSIS — L600 Ingrowing nail: Secondary | ICD-10-CM

## 2017-10-06 DIAGNOSIS — B351 Tinea unguium: Secondary | ICD-10-CM

## 2017-10-06 DIAGNOSIS — M79675 Pain in left toe(s): Secondary | ICD-10-CM

## 2017-10-06 DIAGNOSIS — M79674 Pain in right toe(s): Secondary | ICD-10-CM | POA: Diagnosis not present

## 2017-10-09 NOTE — Progress Notes (Signed)
Subjective: 77 y.o. returns the office today for painful, ingrown, elongated, thickened toenails which she cannot trim herself. Denies any redness or drainage around the nails.  She states that she still having pain in the nails despite being treated in the last appointment.  She states that she still getting pain to all of her toes and ingrown toenails did come back.  Denies any acute changes since last appointment and no new complaints today. Denies any systemic complaints such as fevers, chills, nausea, vomiting.   PCP: Biagio Borg, MD  Objective: AAO 3, NAD-presents in wheelchair DP/PT pulses palpable, CRT less than 3 seconds; there is purple discoloration to the feet Nails hypertrophic, dystrophic, elongated, brittle, discolored  10. There is tenderness overlying the nails 1-5 bilaterally. There is no surrounding erythema or drainage along the nail sites.  There is incurvation to both the medial lateral aspects of bilateral hallux toenails but there is no edema, erythema, drainage or pus or any clinical signs of infection noted today. No open lesions or pre-ulcerative lesions are identified. Mild tenderness palpation of the MPJs but there is no specific area pinpoint bony tenderness or pain to vibratory sensation. No other areas of tenderness bilateral lower extremities. No overlying edema, erythema, increased warmth. No pain with calf compression, swelling, warmth, erythema.  Assessment: Patient presents with symptomatic onychomycosis, ingrown toenails  Plan: -Treatment options including alternatives, risks, complications were discussed -Nails sharply debrided 10 without complication/bleeding.  I was able to sharply debride the symptomatic portion of the ingrown toenail so that any complications or bleeding.  After debridement there was resolution of pain I confirmed this with her and she states that she was no longer having pain that she was having she was happy with the debridement  today.  We discussed partial nail avulsions.  Her circulation pressure appears to be adequate however into the digits the tracings are dampened.  Given her discoloration to the skin I am hesitant to do partial nail avulsion with chemical matricectomy.  I had a long discussion with her and her husband regards to this.  They were very unhappy about having to pay a co-pay to come to the office today.  After the appointment was over they were thankful and had no further questions or concerns and she states that she was no longer having any pain to her toes. -Discussed daily foot inspection. If there are any changes, to call the office immediately.  -Follow-up in 9 to 12 weeks or sooner if any problems are to arise. In the meantime, encouraged to call the office with any questions, concerns, changes symptoms.  Celesta Gentile, DPM

## 2017-10-10 ENCOUNTER — Ambulatory Visit: Payer: Medicare Other | Admitting: Family

## 2017-10-10 ENCOUNTER — Encounter: Payer: Self-pay | Admitting: Family

## 2017-10-10 VITALS — BP 119/80 | HR 76 | Temp 97.9°F | Ht 66.0 in | Wt 117.0 lb

## 2017-10-10 DIAGNOSIS — L299 Pruritus, unspecified: Secondary | ICD-10-CM | POA: Diagnosis not present

## 2017-10-10 DIAGNOSIS — F22 Delusional disorders: Secondary | ICD-10-CM | POA: Diagnosis not present

## 2017-10-10 DIAGNOSIS — W57XXXS Bitten or stung by nonvenomous insect and other nonvenomous arthropods, sequela: Secondary | ICD-10-CM | POA: Diagnosis not present

## 2017-10-10 NOTE — Patient Instructions (Signed)
Nice to meet you!  We will check your stool for ova and parasites.  Recommend generalized fly repellants such as citronella or bug sprays.   We will check your stool for ova and parasites.

## 2017-10-10 NOTE — Progress Notes (Signed)
Subjective:    Patient ID: Alexis Higgins, female    DOB: Nov 26, 1940, 77 y.o.   MRN: [id number]  Chief Complaint  Patient presents with  . Insect Bite    HPI:  Alexis Higgins is a 77 y.o. female who presents today for initial treatment and evaluation of insect bites.  Alexis Higgins began having symptoms concerning for bug infestations that she describes as "having bugs biting her" starting in December of 2019. Since that time she has been on multiple occasions by both her primary care team and dermatology with no significant findings and concern for delusional disorder. She states that she knows the flies are near when she hears he "buzzing sound" and that they will continually bite her with timing worse at night. Bugs have been biting her on her upper and lower extremities. There is one area located on her right distal lower extremity where she indicates she struck the bug with a stick when she heard it buzzing. Since that time the discoloration has not gone away with soap and water and believes the bug's blood is mixed with hers. There is also a small area located on her right forearm where she indicates she has seen baby flies coming out of her arm. She has been treated for scabies by dermatology with no improvement in symptoms. Denies travel outside of the country or even [place]. No fevers, chills, or night sweats. She does have a fly in a bag with picture below. Her home did have carpet beetles which were taken care of by an exterminator. She is extremely adamant that she is not hallucinating this.    Allergies  Allergen Reactions  . Aspirin Other (See Comments)     cns bleed risk  . Codeine Anaphylaxis and Rash  . Other Other (See Comments)    Stiolto - severe reaction - caused inability to breath      Outpatient Medications Prior to Visit  Medication Sig Dispense Refill  . acetaminophen (TYLENOL) 500 MG tablet Take 1,000 mg by mouth every 6 (six) hours as needed for  moderate pain or headache.     . Cholecalciferol (VITAMIN D) 1000 UNITS capsule Take 1,000 Units by mouth every evening.     . clopidogrel (PLAVIX) 75 MG tablet Take 1 tablet (75 mg total) by mouth daily. 90 tablet 3  . docusate sodium (COLACE) 100 MG capsule Take 100 mg by mouth daily as needed for mild constipation.     . furosemide (LASIX) 20 MG tablet Take 1 tablet (20 mg total) by mouth 2 (two) times daily as needed. 180 tablet 3  . hydrOXYzine (ATARAX/VISTARIL) 10 MG tablet Take 1 tablet (10 mg total) by mouth 3 (three) times daily as needed. For itching or nervousness 90 tablet 2  . isosorbide mononitrate (IMDUR) 30 MG 24 hr tablet TAKE 0.5 TABLETS (15 MG TOTAL) BY MOUTH DAILY. 45 tablet 3  . Lidocaine HCl (PAIN RELIEF ROLL-ON EX) Apply 1 application topically 2 (two) times daily as needed (for pain).     Marland Kitchen lovastatin (MEVACOR) 20 MG tablet TAKE 1 TABLET BY MOUTH AT BEDTIME (Patient taking differently: TAKE 20 MG BY MOUTH AT BEDTIME) 90 tablet 3  . metoprolol tartrate (LOPRESSOR) 25 MG tablet TAKE 1 TABLET BY MOUTH TWICE A DAY (Patient taking differently: Take 25 mg by mouth 2 (two) times daily. ) 180 tablet 3  . nitroGLYCERIN (NITROSTAT) 0.4 MG SL tablet Place 1 tablet (0.4 mg total) under the tongue every 5 (  five) minutes as needed for chest pain (up to 3 doses). 25 tablet 4  . ondansetron (ZOFRAN) 4 MG tablet Take 1 tablet (4 mg total) by mouth every 8 (eight) hours as needed for nausea or vomiting. 11 tablet 0  . OXYGEN Inhale 2 L into the lungs continuous.     . polyethylene glycol (MIRALAX / GLYCOLAX) packet Take 17 g by mouth daily as needed for mild constipation.    . polyvinyl alcohol (LIQUIFILM TEARS) 1.4 % ophthalmic solution Place 1 drop into both eyes daily as needed for dry eyes.     . potassium chloride (K-DUR) 10 MEQ tablet Take 1 tablet (10 mEq total) by mouth daily. 90 tablet 3  . predniSONE (DELTASONE) 10 MG tablet TAKE 1 1/2 TABLET BY MOUTH DAILY WITH BREAKFAST (Patient  taking differently: TAKE 15 MG BY MOUTH DAILY WITH BREAKFAST) 45 tablet 5  . PROAIR HFA 108 (90 Base) MCG/ACT inhaler INHALE 2 PUFFS INTO THE LUNGS EVERY 6 (SIX) HOURS AS NEEDED FOR WHEEZING OR SHORTNESS OF BREATH. 25.5 Inhaler 2  . tiotropium (SPIRIVA HANDIHALER) 18 MCG inhalation capsule Place 1 capsule (18 mcg total) into inhaler and inhale daily at 6 (six) AM. 90 capsule 3  . traMADol (ULTRAM) 50 MG tablet Take 100 mg by mouth every 6 (six) hours as needed for moderate pain.     . traZODone (DESYREL) 50 MG tablet Take 50 mg by mouth at bedtime.    . vitamin B-12 (CYANOCOBALAMIN) 1000 MCG tablet Take 1,000 mcg by mouth daily.    . vitamin C (ASCORBIC ACID) 500 MG tablet Take 500 mg by mouth 2 (two) times daily.     No facility-administered medications prior to visit.      Past Medical History:  Diagnosis Date  . Abnormal chest x-ray    03/2012: will need OP f/u.  Marland Kitchen Anginal pain ([place])   . ANXIETY 01/01/2007  . Blood transfusion without reported diagnosis 2015  . BURSITIS, RIGHT HIP 06/04/2009  . CAD (coronary artery disease)    a. BMS to LAD 2010. b. NSTEMI with DES to LAD for ISR 2011. c. Patent stent 03/2012/Imdur added.  . Cataract   . CHEST PAIN-PRECORDIAL 01/15/2009  . Colon polyps    H/o tubular adenoma of colon  . COPD 01/01/2007   a. Chronic resp failure on home O2.  Marland Kitchen DEPRESSION 01/01/2007  . Eczema 01/08/2011  . GROIN PAIN 06/20/2008  . Headache(784.0) 01/01/2007  . Hemorrhoid   . HYPERTENSION 01/01/2007  . Impaired glucose tolerance 01/07/2011  . LOW BACK PAIN 01/01/2007  . Muscle weakness (generalized) 06/04/2009  . On home oxygen therapy    "2L; 24/7" (09/10/2016)  . OSTEOARTHRITIS, HIP 07/01/2008  . OSTEOPOROSIS 01/01/2007  . Oxygen deficiency    O2 2 liters 24/7  . Pneumonia 1998  . Rosacea 01/08/2011  . Shortness of breath   . Stroke [facility])    TIA  . SYNCOPE 01/01/2007  . Thoracic compression fracture ([place])   . TIA (transient ischemic attack)   . TRANSIENT ISCHEMIC  ATTACK, HX OF 01/01/2007      Past Surgical History:  Procedure Laterality Date  . ABDOMINAL HYSTERECTOMY    . APPENDECTOMY    . BACK SURGERY    . BREAST ENHANCEMENT SURGERY    . COLONOSCOPY  01/25/2002   tubular adenoma,hemorrhoids, hyperplastic  colon polyps  . COLONOSCOPY  02/17/2005   hemorrhoids  . CORONARY ANGIOPLASTY    . CORONARY STENT PLACEMENT    . ESOPHAGOGASTRODUODENOSCOPY (  EGD) WITH PROPOFOL N/A 08/10/2017   Procedure: ESOPHAGOGASTRODUODENOSCOPY (EGD) WITH PROPOFOL;  Surgeon: Gatha Mayer, MD;  Location: [facility];  Service: Endoscopy;  Laterality: N/A;  . HIP ARTHROPLASTY Left 10/01/2013   Procedure: ARTHROPLASTY UNIPOLAR   HIP;  Surgeon: Marianna Payment, MD;  Location: [place];  Service: Orthopedics;  Laterality: Left;  . HIP SURGERY Left    DR XU     PROXIMAL NECK   . IR GENERIC HISTORICAL  02/19/2016   IR VERTEBROPLASTY CERV/THOR BX INC UNI/BIL INC/INJECT/IMAGING 02/19/2016 Luanne Bras, MD [facility]INTER[facility] RAD  . IR GENERIC HISTORICAL  07/15/2016   IR KYPHO THORACIC WITH BONE BIOPSY 07/15/2016 Luanne Bras, MD [facility]  . IR KYPHO EA ADDL LEVEL THORACIC OR LUMBAR  09/14/2016  . IR RADIOLOGIST EVAL & MGMT  09/22/2016  . IR VERTEBROPLASTY CERV/THOR BX INC UNI/BIL INC/INJECT/IMAGING  10/08/2016  . IR VERTEBROPLASTY CERV/THOR BX INC UNI/BIL INC/INJECT/IMAGING  11/16/2016  . LEFT HEART CATHETERIZATION WITH CORONARY ANGIOGRAM N/A 04/13/2012   Procedure: LEFT HEART CATHETERIZATION WITH CORONARY ANGIOGRAM;  Surgeon: Burnell Blanks, MD;  Location: [facility] CATH LAB;  Service: Cardiovascular;  Laterality: N/A;  . OOPHORECTOMY     one ovary  . s/p bilat cataract  2010  . sp lumbar disc surgury     [name]      Family History  Problem Relation Age of Onset  . Osteoporosis Mother   . Heart disease Sister   . Emphysema Sister   . Seizures Sister        epilepsy  . Cardiomyopathy Sister   . Heart attack Sister   . Colon cancer Neg Hx   . Colon polyps  Neg Hx   . Esophageal cancer Neg Hx   . Rectal cancer Neg Hx   . Stomach cancer Neg Hx       Social History   Socioeconomic History  . Marital status: Married    Spouse name: Not on file  . Number of children: 4  . Years of education: Not on file  . Highest education level: Not on file  Occupational History  . Occupation: disabled back since 2003  Social Needs  . Financial resource strain: Not on file  . Food insecurity:    Worry: Not on file    Inability: Not on file  . Transportation needs:    Medical: Not on file    Non-medical: Not on file  Tobacco Use  . Smoking status: Former Smoker    Packs/day: 1.50    Years: 51.00    Pack years: 76.50    Types: Cigarettes    Last attempt to quit: 07/24/2005    Years since quitting: 12.2  . Smokeless tobacco: Never Used  Substance and Sexual Activity  . Alcohol use: No    Alcohol/week: 0.0 oz  . Drug use: No  . Sexual activity: Not on file  Lifestyle  . Physical activity:    Days per week: Not on file    Minutes per session: Not on file  . Stress: Not on file  Relationships  . Social connections:    Talks on phone: Not on file    Gets together: Not on file    Attends religious service: Not on file    Active member of club or organization: Not on file    Attends meetings of clubs or organizations: Not on file    Relationship status: Not on file  . Intimate partner violence:    Fear of current  or ex partner: Not on file    Emotionally abused: Not on file    Physically abused: Not on file    Forced sexual activity: Not on file  Other Topics Concern  . Not on file  Social History Narrative   Married   Disabled   Former smoker, no current tobacco EtOH or drugs      Review of Systems  Constitutional: Negative for chills and fever.  HENT: Negative for congestion.   Respiratory: Negative for chest tightness and wheezing.   Cardiovascular: Negative for chest pain and leg swelling.  Skin: Positive for color change.  Negative for rash and wound.  Neurological: Negative for weakness.       Objective:    BP 119/80   Pulse 76   Temp 97.9 F (36.6 C)   Ht 5\' 6"  (1.676 m)   Wt 117 lb (53.1 kg)   SpO2 96%   BMI 18.88 kg/m  Nursing note and vital signs reviewed.  Physical Exam  Constitutional: She is oriented to person, place, and time. No distress.  Seated in the wheelchair; pleasant; appears thin.   Cardiovascular: Normal rate, regular rhythm, normal heart sounds and intact distal pulses.  No murmur heard. Pulmonary/Chest: Effort normal and breath sounds normal.  Neurological: She is alert and oriented to person, place, and time.  Skin: Skin is warm and dry.  Psychiatric: She has a normal mood and affect.                Assessment & Plan:   Problem List Items Addressed This Visit      Other   Itching    Itching could be related to eczema leading to the current wounds that she has. It is most likely the same itching that is leading her to believe that bugs are coming out of her skin, which is clearly not the case here.       Delusional disorder ([place])    Alexis Higgins does appear to have some degree of delusional thinking as she is convinced that flies are attacking her and that they are coming out of her skin. She does have numerous what appear to be contusions, but may also be from itching located on her upper and lower extremities. Explained in detail to the patient and her husband that there is no infectious process going on with no need for antibiotics. She has not travelled making any time of parasite infection astronomically small. We discussed using conventional fly/mosqutio repellents to help keep the bugs off of her. She refused psychology referral. There is no further need for follow up with ID at this time.       Insect bite, sequela    Will check stool for ova and parasites although it is highly unlikely that anything will be found. No further infectious work up indicated.          Other Visit Diagnoses    Insect bite, unspecified site, sequela    -  Primary   Relevant Orders   OVA + PARASITE EXAM       I am having Mikle Bosworth K. Shughart maintain her Vitamin D, docusate sodium, nitroGLYCERIN, polyvinyl alcohol, polyethylene glycol, ondansetron, vitamin B-12, PROAIR HFA, Lidocaine HCl (PAIN RELIEF ROLL-ON EX), acetaminophen, traMADol, OXYGEN, metoprolol tartrate, isosorbide mononitrate, clopidogrel, furosemide, lovastatin, tiotropium, predniSONE, traZODone, vitamin C, potassium chloride, and hydrOXYzine.   Follow-up:  Follow up of signs of infection develop.    Mauricio Po, [place] for Infectious Disease

## 2017-10-11 ENCOUNTER — Encounter: Payer: Self-pay | Admitting: Family

## 2017-10-11 NOTE — Assessment & Plan Note (Signed)
Alexis Higgins does appear to have some degree of delusional thinking as she is convinced that flies are attacking her and that they are coming out of her skin. She does have numerous what appear to be contusions, but may also be from itching located on her upper and lower extremities. Explained in detail to the patient and her husband that there is no infectious process going on with no need for antibiotics. She has not travelled making any time of parasite infection astronomically small. We discussed using conventional fly/mosqutio repellents to help keep the bugs off of her. She refused psychology referral. There is no further need for follow up with ID at this time.

## 2017-10-11 NOTE — Assessment & Plan Note (Signed)
Itching could be related to eczema leading to the current wounds that she has. It is most likely the same itching that is leading her to believe that bugs are coming out of her skin, which is clearly not the case here.

## 2017-10-11 NOTE — Assessment & Plan Note (Signed)
Will check stool for ova and parasites although it is highly unlikely that anything will be found. No further infectious work up indicated.

## 2017-10-13 DIAGNOSIS — J449 Chronic obstructive pulmonary disease, unspecified: Secondary | ICD-10-CM | POA: Diagnosis not present

## 2017-10-13 DIAGNOSIS — R0689 Other abnormalities of breathing: Secondary | ICD-10-CM | POA: Diagnosis not present

## 2017-10-13 DIAGNOSIS — J439 Emphysema, unspecified: Secondary | ICD-10-CM | POA: Diagnosis not present

## 2017-10-13 DIAGNOSIS — R0902 Hypoxemia: Secondary | ICD-10-CM | POA: Diagnosis not present

## 2017-10-13 DIAGNOSIS — J9610 Chronic respiratory failure, unspecified whether with hypoxia or hypercapnia: Secondary | ICD-10-CM | POA: Diagnosis not present

## 2017-10-14 DIAGNOSIS — J449 Chronic obstructive pulmonary disease, unspecified: Secondary | ICD-10-CM | POA: Diagnosis not present

## 2017-10-17 ENCOUNTER — Encounter: Payer: Self-pay | Admitting: Emergency Medicine

## 2017-10-17 DIAGNOSIS — J449 Chronic obstructive pulmonary disease, unspecified: Secondary | ICD-10-CM | POA: Diagnosis not present

## 2017-10-18 ENCOUNTER — Other Ambulatory Visit: Payer: Medicare Other

## 2017-10-18 DIAGNOSIS — R195 Other fecal abnormalities: Secondary | ICD-10-CM

## 2017-10-24 ENCOUNTER — Telehealth: Payer: Self-pay | Admitting: Behavioral Health

## 2017-10-24 NOTE — Telephone Encounter (Signed)
Please inform patient that I have not received any results yet.

## 2017-10-24 NOTE — Telephone Encounter (Signed)
Patient and her husband called today asking about results from stool sample that was collected on 10/19/2017.  Informed both that Terri Piedra NP will be made aware and we will reach back out to them.  They both verbalized understanding. Pricilla Riffle RN

## 2017-10-25 ENCOUNTER — Encounter: Payer: Self-pay | Admitting: Internal Medicine

## 2017-10-25 LAB — OVA AND PARASITE EXAMINATION

## 2017-10-25 LAB — TIQ-NTM

## 2017-10-25 NOTE — Telephone Encounter (Signed)
Called patient back spoke with she and her husband.  Informed them both that lab tests have not resulted per Marya Amsler NP.  Both verbalized understanding. Pricilla Riffle RN

## 2017-10-25 NOTE — Telephone Encounter (Signed)
This encounter was created in error - please disregard.

## 2017-11-01 ENCOUNTER — Encounter: Payer: Self-pay | Admitting: Cardiovascular Disease

## 2017-11-01 ENCOUNTER — Telehealth: Payer: Self-pay | Admitting: *Deleted

## 2017-11-01 DIAGNOSIS — W57XXXS Bitten or stung by nonvenomous insect and other nonvenomous arthropods, sequela: Secondary | ICD-10-CM

## 2017-11-01 NOTE — Telephone Encounter (Signed)
New orders placed for completion.

## 2017-11-01 NOTE — Addendum Note (Signed)
Addended by: Mauricio Po D on: 11/01/2017 05:15 PM   Modules accepted: Orders

## 2017-11-01 NOTE — Telephone Encounter (Signed)
Patient called for results, wants to know why the test was not run - [facility] stated it was "specimen submitted is not suitable for test ordered." Per Greg, will recollect sample and run again.  He will pick up new stool kit, will recollect when she has a suitable bowel movement during office hours so they do not need to store the sample for multiple days in the refrigerator. Please place new standing orders for the new sample. Howell, Michelle M, RN  

## 2017-11-02 ENCOUNTER — Other Ambulatory Visit: Payer: Medicare Other

## 2017-11-02 DIAGNOSIS — W57XXXS Bitten or stung by nonvenomous insect and other nonvenomous arthropods, sequela: Secondary | ICD-10-CM

## 2017-11-02 NOTE — Telephone Encounter (Signed)
Stool collection kit labeled and ready for pick up at front desk.

## 2017-11-03 LAB — TIQ-NTM

## 2017-11-03 LAB — OVA AND PARASITE EXAMINATION

## 2017-11-07 ENCOUNTER — Telehealth: Payer: Self-pay

## 2017-11-07 NOTE — Telephone Encounter (Signed)
Noted. Will continue to follow.

## 2017-11-07 NOTE — Telephone Encounter (Signed)
Pt called today requesting results on sample dropped of last Wednesday 11/02/17. No lab results have been released yet informed pt that it can take up to two weeks before labs come are released depending on the type of lab. Pt's husband stated he is okay on waiting just feels anxious to hear wife's lab results. Pt would like a call back once results are available. [place]

## 2017-11-09 ENCOUNTER — Telehealth: Payer: Self-pay

## 2017-11-09 NOTE — Telephone Encounter (Signed)
We apologize for the repeat cancellation. We have been working with [facility] and have determined that they have changed the collection tube without notifying us this the reason for the cancellation. Our lab has ordered the new collection containers for the sample to be put in. Will have to check with our lab to see if the collection devices are available or if she needs to be sent to an outside [place] lab site for collection.

## 2017-11-09 NOTE — Telephone Encounter (Signed)
Called pt to inform her that we have to correct tube to run her lab now and that she can come to the office to pick it up. PT's husband came into clinic today I was able to answer all his questions regarding the labs and was able to give him instructions on how to collect the specimen needed for this lab. Also apologized to pt and her husband on previous issues regarding her labs. Pt's husband was understanding and was able to understand instructions on collecting specimen. MR. Imai will call our office if he has any more questions regarding the lab or any issues regarding pt.  [place]

## 2017-11-09 NOTE — Telephone Encounter (Signed)
Pt's husband called today annoyed and unsatisfied with what is going on with pt's labs. Mr. Vanderkolk stated that he had dropped a sample for lab testing off on 11/02/17 and that when he logged into [facility] and saw that the lab was canceled by the ancillary. PT is displeased since this is not the first time the labs were canceled due to the same reason. Mr. Gomm would like a call back from Terri Piedra, Np or previous rn that he spoke with the first time labs were canceled. Will route message to Terri Piedra, NP and Harvin Hazel, RN. PT would like a call back asap with next steps or a way to have lab done without it being canceled.  [place]

## 2017-11-13 DIAGNOSIS — R0689 Other abnormalities of breathing: Secondary | ICD-10-CM | POA: Diagnosis not present

## 2017-11-13 DIAGNOSIS — J439 Emphysema, unspecified: Secondary | ICD-10-CM | POA: Diagnosis not present

## 2017-11-14 DIAGNOSIS — J449 Chronic obstructive pulmonary disease, unspecified: Secondary | ICD-10-CM | POA: Diagnosis not present

## 2017-11-16 ENCOUNTER — Other Ambulatory Visit: Payer: Medicare Other

## 2017-11-16 ENCOUNTER — Other Ambulatory Visit: Payer: Self-pay | Admitting: *Deleted

## 2017-11-16 DIAGNOSIS — R195 Other fecal abnormalities: Secondary | ICD-10-CM | POA: Diagnosis not present

## 2017-11-17 ENCOUNTER — Ambulatory Visit: Payer: Medicare Other | Admitting: Emergency Medicine

## 2017-11-17 DIAGNOSIS — J449 Chronic obstructive pulmonary disease, unspecified: Secondary | ICD-10-CM | POA: Diagnosis not present

## 2017-11-18 LAB — OVA AND PARASITE EXAMINATION
CONCENTRATE RESULT:: NONE SEEN
SPECIMEN QUALITY: ADEQUATE
TRICHROME RESULT:: NONE SEEN
VKL: 90768854

## 2017-11-21 ENCOUNTER — Ambulatory Visit: Payer: Medicare Other | Admitting: Adult Health

## 2017-11-21 ENCOUNTER — Encounter: Payer: Self-pay | Admitting: Adult Health

## 2017-11-21 DIAGNOSIS — J9611 Chronic respiratory failure with hypoxia: Secondary | ICD-10-CM | POA: Diagnosis not present

## 2017-11-21 DIAGNOSIS — J439 Emphysema, unspecified: Secondary | ICD-10-CM | POA: Diagnosis not present

## 2017-11-21 DIAGNOSIS — S22000K Wedge compression fracture of unspecified thoracic vertebra, subsequent encounter for fracture with nonunion: Secondary | ICD-10-CM

## 2017-11-21 NOTE — Patient Instructions (Addendum)
Continue on Spiriva daily  Continue on Prednisone 15mg  daily .  Continue on oxygen 2l/m  Follow up with Dr. Lamonte Sakai  In 4 months and As needed

## 2017-11-21 NOTE — Assessment & Plan Note (Signed)
Continue on oxygen 

## 2017-11-21 NOTE — Assessment & Plan Note (Signed)
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  Patient has been unable to tolerate other inhalers.  She is to continue with Spiriva.  Plan  Patient Instructions  Continue on Spiriva daily  Continue on Prednisone 15mg  daily .  Continue on oxygen 2l/m  Follow up with Dr. Lamonte Sakai  In 4 months and As needed      '

## 2017-11-21 NOTE — Progress Notes (Signed)
@Patient  ID: Alexis Higgins, female    DOB: [birth year], 77 y.o.   MRN: [id number]  Chief Complaint  Patient presents with  . Follow-up    COPD     Referring provider: Biagio Borg, MD  HPI: 77 year old female former smoker followed for severe COPD and chronic hypoxic respiratory failure on chronic steroids Past medical history significant for hypertension cerebrovascular disease and coronary artery disease, osteoporosis with compression fractures  TEST  PFT performed in August 2014 ,. These showed very severe obstruction with an FEV1 of 0.69 L or 32% of predicted, and a positive bronchodilator response.   CT chest August 2018 showed bilateral emphysema, neg PE   11/21/2017 Follow up : COPD , O2 RF ., chronic steroids  She returns for a 40-month follow-up.  Says that overall her breathing is doing about the same.  It is hard for her to get her breath at times.  She does have some anxiety associated with this.  She gets winded with minimal activity.  She remains on Spiriva daily.  Along with prednisone 15 mg daily.  Patient has tried many inhalers and nebulizers in the past and is been and able to tolerate. We  try to discuss her advanced directive but she became tearful.  And said that she would discuss this at a later date.  Education was given. Patient had previously been on low-dose CT screening yearly, CT in August 2018 showed bilateral emphysema.  She would like to hold off on this right now. She denies any chest pain orthopnea PND or increased leg swelling   Allergies  Allergen Reactions  . Aspirin Other (See Comments)     cns bleed risk  . Codeine Anaphylaxis and Rash  . Other Other (See Comments)    Stiolto - severe reaction - caused inability to breath    Immunization History  Administered Date(s) Administered  . H1N1 05/02/2008  . Influenza Split 01/23/2011, 02/11/2012  . Influenza Whole 03/22/2008, 02/10/2010  . Influenza, High Dose Seasonal PF 02/10/2016,  03/11/2017  . Influenza,inj,Quad PF,6+ Mos 01/31/2013, 02/01/2014, 02/18/2015  . Pneumococcal Conjugate-13 10/24/2013  . Pneumococcal Polysaccharide-23 06/12/2005, 01/08/2011  . Td 01/21/1997, 06/20/2008    Past Medical History:  Diagnosis Date  . Abnormal chest x-ray    03/2012: will need OP f/u.  Marland Kitchen Anginal pain ([place])   . ANXIETY 01/01/2007  . Blood transfusion without reported diagnosis 2015  . BURSITIS, RIGHT HIP 06/04/2009  . CAD (coronary artery disease)    a. BMS to LAD 2010. b. NSTEMI with DES to LAD for ISR 2011. c. Patent stent 03/2012/Imdur added.  . Cataract   . CHEST PAIN-PRECORDIAL 01/15/2009  . Colon polyps    H/o tubular adenoma of colon  . COPD 01/01/2007   a. Chronic resp failure on home O2.  Marland Kitchen DEPRESSION 01/01/2007  . Eczema 01/08/2011  . GROIN PAIN 06/20/2008  . Headache(784.0) 01/01/2007  . Hemorrhoid   . HYPERTENSION 01/01/2007  . Impaired glucose tolerance 01/07/2011  . LOW BACK PAIN 01/01/2007  . Muscle weakness (generalized) 06/04/2009  . On home oxygen therapy    "2L; 24/7" (09/10/2016)  . OSTEOARTHRITIS, HIP 07/01/2008  . OSTEOPOROSIS 01/01/2007  . Oxygen deficiency    O2 2 liters 24/7  . Pneumonia 1998  . Rosacea 01/08/2011  . Shortness of breath   . Stroke [facility])    TIA  . SYNCOPE 01/01/2007  . Thoracic compression fracture ([place])   . TIA (transient ischemic attack)   . TRANSIENT  ISCHEMIC ATTACK, HX OF 01/01/2007    Tobacco History: Social History   Tobacco Use  Smoking Status Former Smoker  . Packs/day: 1.50  . Years: 51.00  . Pack years: 76.50  . Types: Cigarettes  . Last attempt to quit: 07/24/2005  . Years since quitting: 12.3  Smokeless Tobacco Never Used   Counseling given: Not Answered   Outpatient Encounter Medications as of 11/21/2017  Medication Sig  . acetaminophen (TYLENOL) 500 MG tablet Take 1,000 mg by mouth every 6 (six) hours as needed for moderate pain or headache.   . Cholecalciferol (VITAMIN D) 1000 UNITS capsule Take 1,000  Units by mouth every evening.   . clopidogrel (PLAVIX) 75 MG tablet Take 1 tablet (75 mg total) by mouth daily.  Marland Kitchen docusate sodium (COLACE) 100 MG capsule Take 100 mg by mouth daily as needed for mild constipation.   . furosemide (LASIX) 20 MG tablet Take 1 tablet (20 mg total) by mouth 2 (two) times daily as needed.  . hydrOXYzine (ATARAX/VISTARIL) 10 MG tablet Take 1 tablet (10 mg total) by mouth 3 (three) times daily as needed. For itching or nervousness  . isosorbide mononitrate (IMDUR) 30 MG 24 hr tablet TAKE 0.5 TABLETS (15 MG TOTAL) BY MOUTH DAILY.  Marland Kitchen Lidocaine HCl (PAIN RELIEF ROLL-ON EX) Apply 1 application topically 2 (two) times daily as needed (for pain).   Marland Kitchen lovastatin (MEVACOR) 20 MG tablet TAKE 1 TABLET BY MOUTH AT BEDTIME (Patient taking differently: TAKE 20 MG BY MOUTH AT BEDTIME)  . metoprolol tartrate (LOPRESSOR) 25 MG tablet TAKE 1 TABLET BY MOUTH TWICE A DAY (Patient taking differently: Take 25 mg by mouth 2 (two) times daily. )  . nitroGLYCERIN (NITROSTAT) 0.4 MG SL tablet Place 1 tablet (0.4 mg total) under the tongue every 5 (five) minutes as needed for chest pain (up to 3 doses).  . ondansetron (ZOFRAN) 4 MG tablet Take 1 tablet (4 mg total) by mouth every 8 (eight) hours as needed for nausea or vomiting.  . OXYGEN Inhale 2 L into the lungs continuous.   . polyethylene glycol (MIRALAX / GLYCOLAX) packet Take 17 g by mouth daily as needed for mild constipation.  . polyvinyl alcohol (LIQUIFILM TEARS) 1.4 % ophthalmic solution Place 1 drop into both eyes daily as needed for dry eyes.   . potassium chloride (K-DUR) 10 MEQ tablet Take 1 tablet (10 mEq total) by mouth daily.  . predniSONE (DELTASONE) 10 MG tablet TAKE 1 1/2 TABLET BY MOUTH DAILY WITH BREAKFAST (Patient taking differently: TAKE 15 MG BY MOUTH DAILY WITH BREAKFAST)  . PROAIR HFA 108 (90 Base) MCG/ACT inhaler INHALE 2 PUFFS INTO THE LUNGS EVERY 6 (SIX) HOURS AS NEEDED FOR WHEEZING OR SHORTNESS OF BREATH.  .  tiotropium (SPIRIVA HANDIHALER) 18 MCG inhalation capsule Place 1 capsule (18 mcg total) into inhaler and inhale daily at 6 (six) AM.  . traMADol (ULTRAM) 50 MG tablet Take 100 mg by mouth every 6 (six) hours as needed for moderate pain.   . traZODone (DESYREL) 50 MG tablet Take 50 mg by mouth at bedtime.  . vitamin B-12 (CYANOCOBALAMIN) 1000 MCG tablet Take 1,000 mcg by mouth daily.  . vitamin C (ASCORBIC ACID) 500 MG tablet Take 500 mg by mouth 2 (two) times daily.   No facility-administered encounter medications on file as of 11/21/2017.      Review of Systems  Constitutional:   No  weight loss, night sweats,  Fevers, chills  +, fatigue, or  lassitude.  HEENT:   No headaches,  Difficulty swallowing,  Tooth/dental problems, or  Sore throat,                No sneezing, itching, ear ache, nasal congestion, post nasal drip,   CV:  No chest pain,  Orthopnea, PND, swelling in lower extremities, anasarca, dizziness, palpitations, syncope.   GI  No heartburn, indigestion, abdominal pain, nausea, vomiting, diarrhea, change in bowel habits, loss of appetite, bloody stools.   Resp:   No chest wall deformity  Skin: no rash or lesions.  GU: no dysuria, change in color of urine, no urgency or frequency.  No flank pain, no hematuria   MS:  No joint pain or swelling.  No decreased range of motion.  No back pain.    Physical Exam  BP 122/64 (BP Location: Left Arm, Cuff Size: Normal)   Pulse 82   Ht 5\' 6"  (1.676 m)   Wt 120 lb 3.2 oz (54.5 kg)   SpO2 93%   BMI 19.40 kg/m   GEN: A/Ox3; pleasant , NAD, frail thin in wheelchair on oxygen   HEENT:  [place]/AT,  EACs-clear, TMs-wnl, NOSE-clear, THROAT-clear, no lesions, no postnasal drip or exudate noted.   NECK:  Supple w/ fair ROM; no JVD; normal carotid impulses w/o bruits; no thyromegaly or nodules palpated; no lymphadenopathy.    RESP kyphosis, decreased breath sounds in the bases  no accessory muscle use, no dullness to percussion  CARD:   RRR, no m/r/g, tr peripheral edema, pulses intact, no cyanosis or clubbing.  GI:   Soft & nt; nml bowel sounds; no organomegaly or masses detected.   Musco: Warm bil, no deformities or joint swelling noted.   Neuro: alert, no focal deficits noted.    Skin: Warm, no lesions or rashes    Lab Results:  CBC  BNP No results found for: BNP  ProBNP No results found for: PROBNP  Imaging: No results found.   Assessment & Plan:   COPD (chronic obstructive pulmonary disease) with emphysema ([place]) Very severe COPD that is oxygen and steroid-dependent.  Patient has been unable to tolerate other inhalers.  She is to continue with Spiriva.  Plan  Patient Instructions  Continue on Spiriva daily  Continue on Prednisone 15mg  daily .  Continue on oxygen 2l/m  Follow up with Dr. Lamonte Sakai  In 4 months and As needed      '  Chronic respiratory failure ([place]) Continue on oxygen  Closed compression fracture of thoracic vertebra ([place]) Continue on current regimen.  Steroid patient education given.  Continue follow-up primary care physician     Rexene Edison, NP 11/21/2017

## 2017-11-21 NOTE — Assessment & Plan Note (Signed)
Continue on current regimen.  Steroid patient education given.  Continue follow-up primary care physician

## 2017-11-23 ENCOUNTER — Telehealth: Payer: Self-pay | Admitting: *Deleted

## 2017-11-23 NOTE — Telephone Encounter (Signed)
Called patient and her husband to relay results. They were still concerned about biting flies in their yard/home. They have had their home/yard treated for insects, but cannot control the flies coming from their neighbor's dog lot. They have been researching fly traps.  RN connected them to a contact at [facility] to see what Leonia Reeves has been using as effective fly traps for horse flies (personal connection of this Probation officer). Patients were pleased with the referral, but would like to know what Marya Amsler suggests as the next step. Landis Gandy, RN   [place], FNP  P [facility] Triage Nurse Pool        Please inform patient that are no ova or parasites in her stool.

## 2017-12-06 ENCOUNTER — Other Ambulatory Visit: Payer: Self-pay | Admitting: Emergency Medicine

## 2017-12-09 DIAGNOSIS — J449 Chronic obstructive pulmonary disease, unspecified: Secondary | ICD-10-CM | POA: Diagnosis not present

## 2017-12-13 DIAGNOSIS — R0689 Other abnormalities of breathing: Secondary | ICD-10-CM | POA: Diagnosis not present

## 2017-12-13 DIAGNOSIS — J439 Emphysema, unspecified: Secondary | ICD-10-CM | POA: Diagnosis not present

## 2017-12-14 DIAGNOSIS — J449 Chronic obstructive pulmonary disease, unspecified: Secondary | ICD-10-CM | POA: Diagnosis not present

## 2017-12-16 ENCOUNTER — Encounter: Payer: Self-pay | Admitting: Cardiovascular Disease

## 2017-12-17 DIAGNOSIS — J449 Chronic obstructive pulmonary disease, unspecified: Secondary | ICD-10-CM | POA: Diagnosis not present

## 2017-12-21 ENCOUNTER — Encounter: Payer: Self-pay | Admitting: Internal Medicine

## 2017-12-21 ENCOUNTER — Ambulatory Visit (INDEPENDENT_AMBULATORY_CARE_PROVIDER_SITE_OTHER): Payer: Medicare Other | Admitting: Internal Medicine

## 2017-12-21 ENCOUNTER — Other Ambulatory Visit (INDEPENDENT_AMBULATORY_CARE_PROVIDER_SITE_OTHER): Payer: Medicare Other

## 2017-12-21 ENCOUNTER — Ambulatory Visit (INDEPENDENT_AMBULATORY_CARE_PROVIDER_SITE_OTHER)
Admission: RE | Admit: 2017-12-21 | Discharge: 2017-12-21 | Disposition: A | Discharge status: Home / Self Care | Payer: Medicare Other | Source: Ambulatory Visit | Attending: Internal Medicine | Admitting: Internal Medicine

## 2017-12-21 VITALS — BP 130/80 | HR 87 | Temp 98.2°F | Ht 66.0 in | Wt 120.0 lb

## 2017-12-21 DIAGNOSIS — R7302 Impaired glucose tolerance (oral): Secondary | ICD-10-CM

## 2017-12-21 DIAGNOSIS — R1012 Left upper quadrant pain: Secondary | ICD-10-CM

## 2017-12-21 DIAGNOSIS — K5900 Constipation, unspecified: Secondary | ICD-10-CM | POA: Diagnosis not present

## 2017-12-21 DIAGNOSIS — I10 Essential (primary) hypertension: Secondary | ICD-10-CM | POA: Diagnosis not present

## 2017-12-21 LAB — CBC WITH DIFFERENTIAL/PLATELET
BASOS ABS: 0 10*3/uL (ref 0.0–0.1)
Basophils Relative: 0.4 % (ref 0.0–3.0)
EOS PCT: 0.2 % (ref 0.0–5.0)
Eosinophils Absolute: 0 10*3/uL (ref 0.0–0.7)
HCT: 42.9 % (ref 36.0–46.0)
HEMOGLOBIN: 13.9 g/dL (ref 12.0–15.0)
Lymphocytes Relative: 12 % (ref 12.0–46.0)
Lymphs Abs: 1.4 10*3/uL (ref 0.7–4.0)
MCHC: 32.3 g/dL (ref 30.0–36.0)
MCV: 85.7 fl (ref 78.0–100.0)
MONO ABS: 0.3 10*3/uL (ref 0.1–1.0)
Monocytes Relative: 2.6 % — ABNORMAL LOW (ref 3.0–12.0)
Neutro Abs: 9.8 10*3/uL — ABNORMAL HIGH (ref 1.4–7.7)
Neutrophils Relative %: 84.8 % — ABNORMAL HIGH (ref 43.0–77.0)
Platelets: 289 10*3/uL (ref 150.0–400.0)
RBC: 5 Mil/uL (ref 3.87–5.11)
RDW: 15.1 % (ref 11.5–15.5)
WBC: 11.5 10*3/uL — AB (ref 4.0–10.5)

## 2017-12-21 LAB — BASIC METABOLIC PANEL
BUN: 10 mg/dL (ref 6–23)
CHLORIDE: 101 meq/L (ref 96–112)
CO2: 33 mEq/L — ABNORMAL HIGH (ref 19–32)
CREATININE: 0.92 mg/dL (ref 0.40–1.20)
Calcium: 9 mg/dL (ref 8.4–10.5)
GFR: 63.01 mL/min (ref >60.00)
Glucose, Bld: 156 mg/dL — ABNORMAL HIGH (ref 70–99)
Potassium: 3.9 mEq/L (ref 3.5–5.1)
Sodium: 144 mEq/L (ref 135–145)

## 2017-12-21 LAB — HEPATIC FUNCTION PANEL
ALK PHOS: 69 U/L (ref 39–117)
ALT: 8 U/L (ref 0–35)
AST: 11 U/L (ref 0–37)
Albumin: 3.7 g/dL (ref 3.5–5.2)
BILIRUBIN DIRECT: 0 mg/dL (ref 0.0–0.3)
BILIRUBIN TOTAL: 0.4 mg/dL (ref 0.2–1.2)
Total Protein: 6.1 g/dL (ref 6.0–8.3)

## 2017-12-21 LAB — LIPASE: LIPASE: 4 U/L — AB (ref 11.0–59.0)

## 2017-12-21 NOTE — Progress Notes (Signed)
Subjective:    Patient ID: Alexis Higgins, female    DOB: [birth year], 77 y.o.   MRN: [id number]  HPI  Here with c/o 6 wks left side/luq abd pain, mild to mod,m intermittent, stabbing like sensation with some radiation to lower rib cage but non tender to touch and no rash/swelling involved. No fever, n/v, and Denies urinary symptoms such as dysuria, frequency, urgency, flank pain, hematuria or n/v, fever, chills. Tramadol not working, though tylenol can help.  Feels some better to lie on right side at night  Denies worsening reflux, abd pain, dysphagia, n/v, or blood, though has hx of ongoing intermittent constipation, noted most recently on imaging by CT jan 2019 per GI.  Has been using miralax off and on but not regularly, has not really worked out well.  Last BM x 3 days.  Appetite ok, and wt overall low but stable   Pt denies polydipsia, polyuria,   Wt Readings from Last 3 Encounters:  12/21/17 120 lb (54.4 kg)  11/21/17 120 lb 3.2 oz (54.5 kg)  10/10/17 117 lb (53.1 kg)   Past Medical History:  Diagnosis Date  . Abnormal chest x-ray    03/2012: will need OP f/u.  Marland Kitchen Anginal pain ([place])   . ANXIETY 01/01/2007  . Blood transfusion without reported diagnosis 2015  . BURSITIS, RIGHT HIP 06/04/2009  . CAD (coronary artery disease)    a. BMS to LAD 2010. b. NSTEMI with DES to LAD for ISR 2011. c. Patent stent 03/2012/Imdur added.  . Cataract   . CHEST PAIN-PRECORDIAL 01/15/2009  . Colon polyps    H/o tubular adenoma of colon  . COPD 01/01/2007   a. Chronic resp failure on home O2.  Marland Kitchen DEPRESSION 01/01/2007  . Eczema 01/08/2011  . GROIN PAIN 06/20/2008  . Headache(784.0) 01/01/2007  . Hemorrhoid   . HYPERTENSION 01/01/2007  . Impaired glucose tolerance 01/07/2011  . LOW BACK PAIN 01/01/2007  . Muscle weakness (generalized) 06/04/2009  . On home oxygen therapy    "2L; 24/7" (09/10/2016)  . OSTEOARTHRITIS, HIP 07/01/2008  . OSTEOPOROSIS 01/01/2007  . Oxygen deficiency    O2 2 liters 24/7  .  Pneumonia 1998  . Rosacea 01/08/2011  . Shortness of breath   . Stroke [facility])    TIA  . SYNCOPE 01/01/2007  . Thoracic compression fracture ([place])   . TIA (transient ischemic attack)   . TRANSIENT ISCHEMIC ATTACK, HX OF 01/01/2007   Past Surgical History:  Procedure Laterality Date  . ABDOMINAL HYSTERECTOMY    . APPENDECTOMY    . BACK SURGERY    . BREAST ENHANCEMENT SURGERY    . COLONOSCOPY  01/25/2002   tubular adenoma,hemorrhoids, hyperplastic  colon polyps  . COLONOSCOPY  02/17/2005   hemorrhoids  . CORONARY ANGIOPLASTY    . CORONARY STENT PLACEMENT    . ESOPHAGOGASTRODUODENOSCOPY (EGD) WITH PROPOFOL N/A 08/10/2017   Procedure: ESOPHAGOGASTRODUODENOSCOPY (EGD) WITH PROPOFOL;  Surgeon: Gatha Mayer, MD;  Location: [facility];  Service: Endoscopy;  Laterality: N/A;  . HIP ARTHROPLASTY Left 10/01/2013   Procedure: ARTHROPLASTY UNIPOLAR   HIP;  Surgeon: Marianna Payment, MD;  Location: [facility];  Service: Orthopedics;  Laterality: Left;  . HIP SURGERY Left    DR XU     PROXIMAL NECK   . IR GENERIC HISTORICAL  02/19/2016   IR VERTEBROPLASTY CERV/THOR BX INC UNI/BIL INC/INJECT/IMAGING 02/19/2016 Luanne Bras, MD [facility]-INTER[facility] RAD  . IR GENERIC HISTORICAL  07/15/2016   IR KYPHO THORACIC WITH BONE  BIOPSY 07/15/2016 Luanne Bras, MD [facility]  . IR KYPHO EA ADDL LEVEL THORACIC OR LUMBAR  09/14/2016  . IR RADIOLOGIST EVAL & MGMT  09/22/2016  . IR VERTEBROPLASTY CERV/THOR BX INC UNI/BIL INC/INJECT/IMAGING  10/08/2016  . IR VERTEBROPLASTY CERV/THOR BX INC UNI/BIL INC/INJECT/IMAGING  11/16/2016  . LEFT HEART CATHETERIZATION WITH CORONARY ANGIOGRAM N/A 04/13/2012   Procedure: LEFT HEART CATHETERIZATION WITH CORONARY ANGIOGRAM;  Surgeon: Burnell Blanks, MD;  Location[facility] C[facility] LAB;  Service: Cardiovascular;  Laterality: N/A;  . OOPHORECTOMY     one ovary  . s/p bilat cataract  2010  . sp lumbar disc surgury     [name]    reports that she quit smoking about 12 years ago. Her  smoking use included cigarettes. She has a 76.50 pack-year smoking history. She has never used smokeless tobacco. She reports that she does not drink alcohol or use drugs. family history includes Cardiomyopathy in her sister; Emphysema in her sister; Heart attack in her sister; Heart disease in her sister; Osteoporosis in her mother; Seizures in her sister. Allergies  Allergen Reactions  . Aspirin Other (See Comments)     cns bleed risk  . Codeine Anaphylaxis and Rash  . Other Other (See Comments)    Stiolto - severe reaction - caused inability to breath   Current Outpatient Medications on File Prior to Visit  Medication Sig Dispense Refill  . acetaminophen (TYLENOL) 500 MG tablet Take 1,000 mg by mouth every 6 (six) hours as needed for moderate pain or headache.     . Cholecalciferol (VITAMIN D) 1000 UNITS capsule Take 1,000 Units by mouth every evening.     . clopidogrel (PLAVIX) 75 MG tablet Take 1 tablet (75 mg total) by mouth daily. 90 tablet 3  . diclofenac sodium (VOLTAREN) 1 % GEL Apply topically 4 (four) times daily.    Marland Kitchen docusate sodium (COLACE) 100 MG capsule Take 100 mg by mouth daily as needed for mild constipation.     . furosemide (LASIX) 20 MG tablet Take 1 tablet (20 mg total) by mouth 2 (two) times daily as needed. 180 tablet 3  . hydrOXYzine (ATARAX/VISTARIL) 10 MG tablet Take 1 tablet (10 mg total) by mouth 3 (three) times daily as needed. For itching or nervousness 90 tablet 2  . isosorbide mononitrate (IMDUR) 30 MG 24 hr tablet TAKE 0.5 TABLETS (15 MG TOTAL) BY MOUTH DAILY. 45 tablet 3  . Lidocaine HCl (PAIN RELIEF ROLL-ON EX) Apply 1 application topically 2 (two) times daily as needed (for pain).     Marland Kitchen lovastatin (MEVACOR) 20 MG tablet TAKE 1 TABLET BY MOUTH AT BEDTIME (Patient taking differently: TAKE 20 MG BY MOUTH AT BEDTIME) 90 tablet 3  . metoprolol tartrate (LOPRESSOR) 25 MG tablet TAKE 1 TABLET BY MOUTH TWICE A DAY (Patient taking differently: Take 25 mg by mouth 2  (two) times daily. ) 180 tablet 3  . nitroGLYCERIN (NITROSTAT) 0.4 MG SL tablet Place 1 tablet (0.4 mg total) under the tongue every 5 (five) minutes as needed for chest pain (up to 3 doses). 25 tablet 4  . ondansetron (ZOFRAN) 4 MG tablet Take 1 tablet (4 mg total) by mouth every 8 (eight) hours as needed for nausea or vomiting. 11 tablet 0  . OXYGEN Inhale 2 L into the lungs continuous.     . polyethylene glycol (MIRALAX / GLYCOLAX) packet Take 17 g by mouth daily as needed for mild constipation.    . polyvinyl alcohol (LIQUIFILM TEARS)  1.4 % ophthalmic solution Place 1 drop into both eyes daily as needed for dry eyes.     . potassium chloride (K-DUR) 10 MEQ tablet Take 1 tablet (10 mEq total) by mouth daily. 90 tablet 3  . predniSONE (DELTASONE) 10 MG tablet TAKE 1 1/2 TABLET BY MOUTH DAILY WITH BREAKFAST (Patient taking differently: TAKE 15 MG BY MOUTH DAILY WITH BREAKFAST) 45 tablet 5  . PROAIR HFA 108 (90 Base) MCG/ACT inhaler INHALE 2 PUFFS INTO THE LUNGS EVERY 6 (SIX) HOURS AS NEEDED FOR WHEEZING OR SHORTNESS OF BREATH. 25.5 Inhaler 2  . tiotropium (SPIRIVA HANDIHALER) 18 MCG inhalation capsule Place 1 capsule (18 mcg total) into inhaler and inhale daily at 6 (six) AM. 90 capsule 3  . traMADol (ULTRAM) 50 MG tablet Take 100 mg by mouth every 6 (six) hours as needed for moderate pain.     . traZODone (DESYREL) 50 MG tablet Take 50 mg by mouth at bedtime.    . vitamin B-12 (CYANOCOBALAMIN) 1000 MCG tablet Take 1,000 mcg by mouth daily.    . vitamin C (ASCORBIC ACID) 500 MG tablet Take 500 mg by mouth 2 (two) times daily.    . predniSONE (DELTASONE) 5 MG tablet TAKE 3 TABLETS (15MG ) DAILY WITH BREAKFAST (Patient not taking: Reported on 12/21/2017) 90 tablet 5   No current facility-administered medications on file prior to visit.    Review of Systems  Constitutional: Negative for other unusual diaphoresis or sweats HENT: Negative for ear discharge or swelling Eyes: Negative for other  worsening visual disturbances Respiratory: Negative for stridor or other swelling  Gastrointestinal: Negative for worsening distension or other blood Genitourinary: Negative for retention or other urinary change Musculoskeletal: Negative for other MSK pain or swelling Skin: Negative for color change or other new lesions Neurological: Negative for worsening tremors and other numbness  Psychiatric/Behavioral: Negative for worsening agitation or other fatigue All other system neg per pt    Objective:   Physical Exam BP 130/80 (BP Location: Left Arm, Patient Position: Sitting, Cuff Size: Normal)   Pulse 87   Temp 98.2 F (36.8 C) (Oral)   Ht 5\' 6"  (1.676 m)   Wt 120 lb (54.4 kg)   SpO2 95%   BMI 19.37 kg/m  VS noted,  Constitutional: Pt appears in NAD HENT: Head: NCAT.  Right Ear: External ear normal.  Left Ear: External ear normal.  Eyes: . Pupils are equal, round, and reactive to light. Conjunctivae and EOM are normal Nose: without d/c or deformity Neck: Neck supple. Gross normal ROM Cardiovascular: Normal rate and regular rhythm.   Pulmonary/Chest: Effort normal and breath sounds without rales or wheezing.  Abd:  Soft, NT, ND, + BS, no organomegaly Neurological: Pt is alert. At baseline orientation, motor grossly intact Skin: Skin is warm. No rashes, other new lesions, no LE edema Psychiatric: Pt behavior is normal without agitation  No other exam finding    Assessment & Plan:

## 2017-12-21 NOTE — Assessment & Plan Note (Signed)
stable overall by history and exam, recent data reviewed with pt, and pt to continue medical treatment as before,  to f/u any worsening symptoms or concerns BP Readings from Last 3 Encounters:  12/21/17 130/80  11/21/17 122/64  10/10/17 119/80

## 2017-12-21 NOTE — Patient Instructions (Addendum)
Ok to hold the miralax and try the Senakot (or similar) at bedtime every night  Please call if a few days if not working for Lactulose  OK to take the OTC Allegra instead of the hydroxyzine  Please continue all other medications as before, and refills have been done if requested.  Please have the pharmacy call with any other refills you may need.  Please continue your efforts at being more active, low cholesterol diet, and weight control.  Please keep your appointments with your specialists as you may have planned  Please go to the XRAY Department in the Basement (go straight as you get off the elevator) for the x-ray testing  Please go to the LAB in the Basement (turn left off the elevator) for the tests to be done today  You will be contacted by phone if any changes need to be made immediately.  Otherwise, you will receive a letter about your results with an explanation, but please check with MyChart first.

## 2017-12-21 NOTE — Assessment & Plan Note (Signed)
stable overall by history and exam, recent data reviewed with pt, and pt to continue medical treatment as before,  to f/u any worsening symptoms or concerns Lab Results  Component Value Date   HGBA1C 5.6 08/12/2017

## 2017-12-21 NOTE — Assessment & Plan Note (Signed)
I suspect constipation pain related, to change the miralax to daily senakot qhs, miralax prn ok but consider lactulose or other for no improvement, minimize tramadol if possible, check acute abd films as may reveal stool burden, and check UA and labs as ordered.

## 2017-12-22 ENCOUNTER — Other Ambulatory Visit (INDEPENDENT_AMBULATORY_CARE_PROVIDER_SITE_OTHER): Payer: Medicare Other

## 2017-12-22 DIAGNOSIS — R1012 Left upper quadrant pain: Secondary | ICD-10-CM

## 2017-12-22 LAB — URINALYSIS, ROUTINE W REFLEX MICROSCOPIC
Bilirubin Urine: NEGATIVE
Hgb urine dipstick: NEGATIVE
KETONES UR: NEGATIVE
LEUKOCYTES UA: NEGATIVE
Nitrite: NEGATIVE
PH: 7.5 (ref 5.0–8.0)
SPECIFIC GRAVITY, URINE: 1.01 (ref 1.000–1.030)
Total Protein, Urine: NEGATIVE
URINE GLUCOSE: NEGATIVE
UROBILINOGEN UA: 0.2 (ref 0.0–1.0)

## 2017-12-29 ENCOUNTER — Telehealth: Payer: Self-pay | Admitting: Internal Medicine

## 2017-12-29 MED ORDER — LACTULOSE 20 GM/30ML PO SOLN: ORAL | 5 refills | Status: DC

## 2017-12-29 NOTE — Addendum Note (Signed)
Addended by: Biagio Borg on: 12/29/2017 12:06 PM   Modules accepted: Orders

## 2017-12-29 NOTE — Telephone Encounter (Signed)
Pt and husband has been informed and expressed understanding.

## 2017-12-29 NOTE — Telephone Encounter (Signed)
Copied from [place] [id number]. Topic: Quick Communication - Rx Refill/Question >> Dec 29, 2017 11:17 AM Scherrie Gerlach wrote: Medication: Lactulose  Pt tried the senokot as instructed by Dr Jenny Reichmann from appt on 7/31.  Pt tried the miralax, and it did not work either. Husband states he was to call back and it it did not work, Dr Jenny Reichmann would prescribe something else.  Pt had very very slight results, but not enough to keep the pain away.  Pt is not having BM's, and continues to have the side pain.  [id number] [address], [place]. [phone] (Phone) [phone] (Fax)

## 2017-12-29 NOTE — Telephone Encounter (Signed)
Ok to try lactulose - done erx

## 2018-01-05 ENCOUNTER — Ambulatory Visit: Payer: Medicare Other | Admitting: Podiatry

## 2018-01-09 DIAGNOSIS — M47817 Spondylosis without myelopathy or radiculopathy, lumbosacral region: Secondary | ICD-10-CM | POA: Diagnosis not present

## 2018-01-09 DIAGNOSIS — J449 Chronic obstructive pulmonary disease, unspecified: Secondary | ICD-10-CM | POA: Diagnosis not present

## 2018-01-09 DIAGNOSIS — M961 Postlaminectomy syndrome, not elsewhere classified: Secondary | ICD-10-CM | POA: Diagnosis not present

## 2018-01-09 DIAGNOSIS — G894 Chronic pain syndrome: Secondary | ICD-10-CM | POA: Diagnosis not present

## 2018-01-09 DIAGNOSIS — G4700 Insomnia, unspecified: Secondary | ICD-10-CM | POA: Diagnosis not present

## 2018-01-12 ENCOUNTER — Other Ambulatory Visit: Payer: Self-pay | Admitting: Cardiovascular Disease

## 2018-01-12 ENCOUNTER — Other Ambulatory Visit: Payer: Self-pay | Admitting: Internal Medicine

## 2018-01-13 DIAGNOSIS — R0689 Other abnormalities of breathing: Secondary | ICD-10-CM | POA: Diagnosis not present

## 2018-01-13 DIAGNOSIS — J439 Emphysema, unspecified: Secondary | ICD-10-CM | POA: Diagnosis not present

## 2018-01-14 DIAGNOSIS — J449 Chronic obstructive pulmonary disease, unspecified: Secondary | ICD-10-CM | POA: Diagnosis not present

## 2018-01-17 DIAGNOSIS — J449 Chronic obstructive pulmonary disease, unspecified: Secondary | ICD-10-CM | POA: Diagnosis not present

## 2018-01-20 DIAGNOSIS — G894 Chronic pain syndrome: Secondary | ICD-10-CM | POA: Diagnosis not present

## 2018-01-20 DIAGNOSIS — Z79891 Long term (current) use of opiate analgesic: Secondary | ICD-10-CM | POA: Diagnosis not present

## 2018-01-27 ENCOUNTER — Ambulatory Visit: Payer: Medicare Other | Admitting: Podiatry

## 2018-01-28 DIAGNOSIS — H40033 Anatomical narrow angle, bilateral: Secondary | ICD-10-CM | POA: Diagnosis not present

## 2018-01-28 DIAGNOSIS — H1013 Acute atopic conjunctivitis, bilateral: Secondary | ICD-10-CM | POA: Diagnosis not present

## 2018-02-06 DIAGNOSIS — H04123 Dry eye syndrome of bilateral lacrimal glands: Secondary | ICD-10-CM | POA: Diagnosis not present

## 2018-02-09 DIAGNOSIS — J449 Chronic obstructive pulmonary disease, unspecified: Secondary | ICD-10-CM | POA: Diagnosis not present

## 2018-02-10 ENCOUNTER — Other Ambulatory Visit (INDEPENDENT_AMBULATORY_CARE_PROVIDER_SITE_OTHER): Payer: Medicare Other

## 2018-02-10 DIAGNOSIS — R7302 Impaired glucose tolerance (oral): Secondary | ICD-10-CM

## 2018-02-10 DIAGNOSIS — I10 Essential (primary) hypertension: Secondary | ICD-10-CM | POA: Diagnosis not present

## 2018-02-10 DIAGNOSIS — E785 Hyperlipidemia, unspecified: Secondary | ICD-10-CM

## 2018-02-10 LAB — LIPID PANEL
CHOLESTEROL: 164 mg/dL (ref 0–200)
HDL: 87.7 mg/dL (ref >39.00)
LDL Cholesterol: 42 mg/dL (ref 0–99)
NONHDL: 76.76
Total CHOL/HDL Ratio: 2
Triglycerides: 173 mg/dL — ABNORMAL HIGH (ref 0.0–149.0)
VLDL: 34.6 mg/dL (ref 0.0–40.0)

## 2018-02-10 LAB — CBC WITH DIFFERENTIAL/PLATELET
BASOS PCT: 1 % (ref 0.0–3.0)
Basophils Absolute: 0.1 10*3/uL (ref 0.0–0.1)
Eosinophils Absolute: 0.1 10*3/uL (ref 0.0–0.7)
Eosinophils Relative: 0.7 % (ref 0.0–5.0)
HEMATOCRIT: 38.4 % (ref 36.0–46.0)
HEMOGLOBIN: 12.7 g/dL (ref 12.0–15.0)
LYMPHS PCT: 25 % (ref 12.0–46.0)
Lymphs Abs: 2.5 10*3/uL (ref 0.7–4.0)
MCHC: 33.1 g/dL (ref 30.0–36.0)
MCV: 86 fl (ref 78.0–100.0)
MONO ABS: 0.8 10*3/uL (ref 0.1–1.0)
MONOS PCT: 8.4 % (ref 3.0–12.0)
Neutro Abs: 6.6 10*3/uL (ref 1.4–7.7)
Neutrophils Relative %: 64.9 % (ref 43.0–77.0)
Platelets: 222 10*3/uL (ref 150.0–400.0)
RBC: 4.46 Mil/uL (ref 3.87–5.11)
RDW: 15.1 % (ref 11.5–15.5)
WBC: 10.1 10*3/uL (ref 4.0–10.5)

## 2018-02-10 LAB — TSH: TSH: 2.54 u[IU]/mL (ref 0.35–4.50)

## 2018-02-10 LAB — HEPATIC FUNCTION PANEL
ALBUMIN: 3.2 g/dL — AB (ref 3.5–5.2)
ALT: 6 U/L (ref 0–35)
AST: 10 U/L (ref 0–37)
Alkaline Phosphatase: 49 U/L (ref 39–117)
Bilirubin, Direct: 0.1 mg/dL (ref 0.0–0.3)
TOTAL PROTEIN: 5.3 g/dL — AB (ref 6.0–8.3)
Total Bilirubin: 0.4 mg/dL (ref 0.2–1.2)

## 2018-02-10 LAB — BASIC METABOLIC PANEL
BUN: 10 mg/dL (ref 6–23)
CALCIUM: 8.7 mg/dL (ref 8.4–10.5)
CO2: 37 meq/L — AB (ref 19–32)
Chloride: 101 mEq/L (ref 96–112)
Creatinine, Ser: 0.89 mg/dL (ref 0.40–1.20)
GFR: 65.44 mL/min (ref >60.00)
GLUCOSE: 85 mg/dL (ref 70–99)
Potassium: 3.7 mEq/L (ref 3.5–5.1)
SODIUM: 144 meq/L (ref 135–145)

## 2018-02-10 LAB — HEMOGLOBIN A1C: HEMOGLOBIN A1C: 5.7 % (ref 4.6–6.5)

## 2018-02-13 ENCOUNTER — Encounter: Payer: Self-pay | Admitting: Podiatry

## 2018-02-13 ENCOUNTER — Other Ambulatory Visit (INDEPENDENT_AMBULATORY_CARE_PROVIDER_SITE_OTHER): Payer: Medicare Other

## 2018-02-13 ENCOUNTER — Ambulatory Visit: Payer: Medicare Other | Admitting: Podiatry

## 2018-02-13 DIAGNOSIS — B351 Tinea unguium: Secondary | ICD-10-CM

## 2018-02-13 DIAGNOSIS — I10 Essential (primary) hypertension: Secondary | ICD-10-CM

## 2018-02-13 DIAGNOSIS — M79674 Pain in right toe(s): Secondary | ICD-10-CM | POA: Diagnosis not present

## 2018-02-13 DIAGNOSIS — R7302 Impaired glucose tolerance (oral): Secondary | ICD-10-CM | POA: Diagnosis not present

## 2018-02-13 DIAGNOSIS — E785 Hyperlipidemia, unspecified: Secondary | ICD-10-CM

## 2018-02-13 DIAGNOSIS — I2510 Atherosclerotic heart disease of native coronary artery without angina pectoris: Secondary | ICD-10-CM | POA: Diagnosis not present

## 2018-02-13 DIAGNOSIS — M79675 Pain in left toe(s): Secondary | ICD-10-CM | POA: Diagnosis not present

## 2018-02-13 DIAGNOSIS — J9610 Chronic respiratory failure, unspecified whether with hypoxia or hypercapnia: Secondary | ICD-10-CM | POA: Diagnosis not present

## 2018-02-13 DIAGNOSIS — L600 Ingrowing nail: Secondary | ICD-10-CM

## 2018-02-13 LAB — URINALYSIS, ROUTINE W REFLEX MICROSCOPIC
Bilirubin Urine: NEGATIVE
Hgb urine dipstick: NEGATIVE
KETONES UR: NEGATIVE
LEUKOCYTES UA: NEGATIVE
Nitrite: NEGATIVE
SPECIFIC GRAVITY, URINE: 1.01 (ref 1.000–1.030)
Total Protein, Urine: NEGATIVE
UROBILINOGEN UA: 0.2 (ref 0.0–1.0)
Urine Glucose: NEGATIVE
pH: 6.5 (ref 5.0–8.0)

## 2018-02-14 NOTE — Progress Notes (Signed)
Subjective: 77 y.o. returns the office today for painful, ingrown, elongated, thickened toenails which she cannot trim herself. Denies any redness or drainage around the nails.  She still gets ingrown toenails to both corners of both big toes.  Denies any acute changes since last appointment and no new complaints today. Denies any systemic complaints such as fevers, chills, nausea, vomiting.   PCP: Biagio Borg, MD  Objective: AAO 3, NAD-presents in wheelchair DP/PT pulses palpable, CRT less than 3 seconds; there is purple discoloration to the feet Nails hypertrophic, dystrophic, elongated, brittle, discolored  10. There is tenderness overlying the nails 1-5 bilaterally. There is no surrounding erythema or drainage along the nail sites.  There is incurvation to both the medial lateral aspects of bilateral hallux toenails but there is no edema, erythema, drainage or pus or any clinical signs of infection noted today. No open lesions or pre-ulcerative lesions are identified. No pain with calf compression, swelling, warmth, erythema.  Assessment: Patient presents with symptomatic onychomycosis, ingrown toenails  Plan: -Treatment options including alternatives, risks, complications were discussed -Nails sharply debrided 10 without complication/bleeding. I debrided the corners of the ingrown toenails to the patients satisfaction without any issues.  -Discussed daily foot inspection. If there are any changes, to call the office immediately.  -Follow-up in 9 to 12 weeks or sooner if any problems are to arise. In the meantime, encouraged to call the office with any questions, concerns, changes symptoms.  Celesta Gentile, DPM

## 2018-02-15 ENCOUNTER — Other Ambulatory Visit: Payer: Self-pay | Admitting: Internal Medicine

## 2018-02-16 ENCOUNTER — Other Ambulatory Visit: Payer: Self-pay | Admitting: Cardiovascular Disease

## 2018-02-16 ENCOUNTER — Ambulatory Visit (INDEPENDENT_AMBULATORY_CARE_PROVIDER_SITE_OTHER): Payer: Medicare Other | Admitting: Internal Medicine

## 2018-02-16 ENCOUNTER — Telehealth: Payer: Self-pay | Admitting: Internal Medicine

## 2018-02-16 ENCOUNTER — Encounter: Payer: Self-pay | Admitting: Internal Medicine

## 2018-02-16 VITALS — BP 124/62 | HR 84 | Temp 98.1°F | Resp 16 | Wt 123.0 lb

## 2018-02-16 DIAGNOSIS — F418 Other specified anxiety disorders: Secondary | ICD-10-CM | POA: Diagnosis not present

## 2018-02-16 DIAGNOSIS — Z0000 Encounter for general adult medical examination without abnormal findings: Secondary | ICD-10-CM

## 2018-02-16 DIAGNOSIS — Z23 Encounter for immunization: Secondary | ICD-10-CM | POA: Diagnosis not present

## 2018-02-16 DIAGNOSIS — R7302 Impaired glucose tolerance (oral): Secondary | ICD-10-CM

## 2018-02-16 MED ORDER — CLONAZEPAM 0.5 MG PO TABS: 0.5000 mg | ORAL_TABLET | Freq: Two times a day (BID) | ORAL | 5 refills | Status: DC | PRN

## 2018-02-16 NOTE — Telephone Encounter (Signed)
Copied from [place] [id number]. Topic: General - Other >> Feb 16, 2018  4:32 PM Keene Breath wrote: Reason for CRM: Alex with [facility] called to inform doctor that there is a drug interaction for prescription of clonazePAM (KLONOPIN) 0.5 MG tablet and traMADol (ULTRAM) 50 MG tablet.  Please advise.  CB# [phone].

## 2018-02-16 NOTE — Patient Instructions (Addendum)
You had the flu shot today  OK to stop the hydroxyzine  Please take all new medication as prescribed - the anxiety medication  Please continue all other medications as before, and refills have been done if requested.  Please have the pharmacy call with any other refills you may need.  Please continue your efforts at being more active, low cholesterol diet, and weight control.  You are otherwise up to date with prevention measures today.  Please keep your appointments with your specialists as you may have planned  Please return in 6 months, or sooner if needed, with Lab testing done 3-5 days before

## 2018-02-16 NOTE — Progress Notes (Signed)
Subjective:    Patient ID: Alexis Higgins, female    DOB: Sep 27, 1940, 77 y.o.   MRN: [id number]  HPI  Here for wellness and f/u;  Overall doing ok;  Pt denies Chest pain, worsening SOB, DOE, wheezing, orthopnea, PND, worsening LE edema, palpitations, dizziness or syncope.  Pt denies neurological change such as new headache, facial or extremity weakness.  Pt denies polydipsia, polyuria, or low sugar symptoms. Pt states overall good compliance with treatment and medications, good tolerability, and has been trying to follow appropriate diet. No fever, night sweats, wt loss, loss of appetite, or other constitutional symptoms.  Pt states good ability with ADL's, has low fall risk, home safety reviewed and adequate, no other significant changes in hearing or vision, and not active with exercise.  Pt states her biggest problem is anxiety ongoing, Denies worsening depressive symptoms, suicidal ideation, or panic, but just very uncomfortable and benzo not working out well Past Medical History:  Diagnosis Date  . Abnormal chest x-ray    03/2012: will need OP f/u.  Marland Kitchen Anginal pain ([place])   . ANXIETY 01/01/2007  . Blood transfusion without reported diagnosis 2015  . BURSITIS, RIGHT HIP 06/04/2009  . CAD (coronary artery disease)    a. BMS to LAD 2010. b. NSTEMI with DES to LAD for ISR 2011. c. Patent stent 03/2012/Imdur added.  . Cataract   . CHEST PAIN-PRECORDIAL 01/15/2009  . Colon polyps    H/o tubular adenoma of colon  . COPD 01/01/2007   a. Chronic resp failure on home O2.  Marland Kitchen DEPRESSION 01/01/2007  . Eczema 01/08/2011  . GROIN PAIN 06/20/2008  . Headache(784.0) 01/01/2007  . Hemorrhoid   . HYPERTENSION 01/01/2007  . Impaired glucose tolerance 01/07/2011  . LOW BACK PAIN 01/01/2007  . Muscle weakness (generalized) 06/04/2009  . On home oxygen therapy    "2L; 24/7" (09/10/2016)  . OSTEOARTHRITIS, HIP 07/01/2008  . OSTEOPOROSIS 01/01/2007  . Oxygen deficiency    O2 2 liters 24/7  . Pneumonia 1998  .  Rosacea 01/08/2011  . Shortness of breath   . Stroke [facility])    TIA  . SYNCOPE 01/01/2007  . Thoracic compression fracture ([place])   . TIA (transient ischemic attack)   . TRANSIENT ISCHEMIC ATTACK, HX OF 01/01/2007   Past Surgical History:  Procedure Laterality Date  . ABDOMINAL HYSTERECTOMY    . APPENDECTOMY    . BACK SURGERY    . BREAST ENHANCEMENT SURGERY    . COLONOSCOPY  01/25/2002   tubular adenoma,hemorrhoids, hyperplastic  colon polyps  . COLONOSCOPY  02/17/2005   hemorrhoids  . CORONARY ANGIOPLASTY    . CORONARY STENT PLACEMENT    . ESOPHAGOGASTRODUODENOSCOPY (EGD) WITH PROPOFOL N/A 08/10/2017   Procedure: ESOPHAGOGASTRODUODENOSCOPY (EGD) WITH PROPOFOL;  Surgeon: Gatha Mayer, MD;  Location: [facility];  Service: Endoscopy;  Laterality: N/A;  . HIP ARTHROPLASTY Left 10/01/2013   Procedure: ARTHROPLASTY UNIPOLAR   HIP;  Surgeon: Marianna Payment, MD;  Location: [place];  Service: Orthopedics;  Laterality: Left;  . HIP SURGERY Left    DR XU     PROXIMAL NECK   . IR GENERIC HISTORICAL  02/19/2016   IR VERTEBROPLASTY CERV/THOR BX INC UNI/BIL INC/INJECT/IMAGING 02/19/2016 Luanne Bras, MD [facility]  . IR GENERIC HISTORICAL  07/15/2016   IR KYPHO THORACIC WITH BONE BIOPSY 07/15/2016 Luanne Bras, MD [facility]  . IR KYPHO EA ADDL LEVEL THORACIC OR LUMBAR  09/14/2016  . IR RADIOLOGIST EVAL & MGMT  09/22/2016  .  IR VERTEBROPLASTY CERV/THOR BX INC UNI/BIL INC/INJECT/IMAGING  10/08/2016  . IR VERTEBROPLASTY CERV/THOR BX INC UNI/BIL INC/INJECT/IMAGING  11/16/2016  . LEFT HEART CATHETERIZATION WITH CORONARY ANGIOGRAM N/A 04/13/2012   Procedure: LEFT HEART CATHETERIZATION WITH CORONARY ANGIOGRAM;  Surgeon: Burnell Blanks, MD;  Location: [facility] CATH LAB;  Service: Cardiovascular;  Laterality: N/A;  . OOPHORECTOMY     one ovary  . s/p bilat cataract  2010  . sp lumbar disc surgury     [name]    reports that she quit smoking about 12 years ago. Her smoking use  included cigarettes. She has a 76.50 pack-year smoking history. She has never used smokeless tobacco. She reports that she does not drink alcohol or use drugs. family history includes Cardiomyopathy in her sister; Emphysema in her sister; Heart attack in her sister; Heart disease in her sister; Osteoporosis in her mother; Seizures in her sister. Allergies  Allergen Reactions  . Aspirin Other (See Comments)     cns bleed risk  . Codeine Anaphylaxis and Rash  . Other Other (See Comments)    Stiolto - severe reaction - caused inability to breath   Current Outpatient Medications on File Prior to Visit  Medication Sig Dispense Refill  . acetaminophen (TYLENOL) 500 MG tablet Take 1,000 mg by mouth every 6 (six) hours as needed for moderate pain or headache.     . Cholecalciferol (VITAMIN D) 1000 UNITS capsule Take 1,000 Units by mouth every evening.     . clopidogrel (PLAVIX) 75 MG tablet TAKE 1 TABLET BY MOUTH EVERY DAY 90 tablet 0  . diclofenac sodium (VOLTAREN) 1 % GEL Apply topically 4 (four) times daily.    Marland Kitchen docusate sodium (COLACE) 100 MG capsule Take 100 mg by mouth daily as needed for mild constipation.     . furosemide (LASIX) 20 MG tablet Take 1 tablet (20 mg total) by mouth 2 (two) times daily as needed. 180 tablet 3  . Lactulose 20 GM/30ML SOLN 30 ml by mouth three times per day as needed for constipation 450 mL 5  . Lidocaine HCl (PAIN RELIEF ROLL-ON EX) Apply 1 application topically 2 (two) times daily as needed (for pain).     Marland Kitchen lovastatin (MEVACOR) 20 MG tablet TAKE 20 MG BY MOUTH AT BEDTIME 90 tablet 1  . metoprolol tartrate (LOPRESSOR) 25 MG tablet TAKE 1 TABLET BY MOUTH TWICE A DAY (Patient taking differently: Take 25 mg by mouth 2 (two) times daily. ) 180 tablet 3  . nitroGLYCERIN (NITROSTAT) 0.4 MG SL tablet Place 1 tablet (0.4 mg total) under the tongue every 5 (five) minutes as needed for chest pain (up to 3 doses). 25 tablet 4  . ondansetron (ZOFRAN) 4 MG tablet Take 1  tablet (4 mg total) by mouth every 8 (eight) hours as needed for nausea or vomiting. 11 tablet 0  . OXYGEN Inhale 2 L into the lungs continuous.     . polyethylene glycol (MIRALAX / GLYCOLAX) packet Take 17 g by mouth daily as needed for mild constipation.    . polyvinyl alcohol (LIQUIFILM TEARS) 1.4 % ophthalmic solution Place 1 drop into both eyes daily as needed for dry eyes.     . potassium chloride (K-DUR) 10 MEQ tablet Take 1 tablet (10 mEq total) by mouth daily. 90 tablet 3  . predniSONE (DELTASONE) 10 MG tablet TAKE 1 1/2 TABLET BY MOUTH DAILY WITH BREAKFAST (Patient taking differently: TAKE 15 MG BY MOUTH DAILY WITH BREAKFAST) 45 tablet 5  .  predniSONE (DELTASONE) 5 MG tablet TAKE 3 TABLETS (15MG ) DAILY WITH BREAKFAST 90 tablet 5  . PROAIR HFA 108 (90 Base) MCG/ACT inhaler INHALE 2 PUFFS INTO THE LUNGS EVERY 6 (SIX) HOURS AS NEEDED FOR WHEEZING OR SHORTNESS OF BREATH. 25.5 Inhaler 2  . tiotropium (SPIRIVA HANDIHALER) 18 MCG inhalation capsule Place 1 capsule (18 mcg total) into inhaler and inhale daily at 6 (six) AM. 90 capsule 3  . traMADol (ULTRAM) 50 MG tablet Take 100 mg by mouth every 6 (six) hours as needed for moderate pain.     . traZODone (DESYREL) 50 MG tablet Take 50 mg by mouth at bedtime.    . vitamin B-12 (CYANOCOBALAMIN) 1000 MCG tablet Take 1,000 mcg by mouth daily.    . vitamin C (ASCORBIC ACID) 500 MG tablet Take 500 mg by mouth 2 (two) times daily.     No current facility-administered medications on file prior to visit.    Review of Systems  Constitutional: Negative for other unusual diaphoresis or sweats HENT: Negative for ear discharge or swelling Eyes: Negative for other worsening visual disturbances Respiratory: Negative for stridor or other swelling  Gastrointestinal: Negative for worsening distension or other blood Genitourinary: Negative for retention or other urinary change Musculoskeletal: Negative for other MSK pain or swelling Skin: Negative for color  change or other new lesions Neurological: Negative for worsening tremors and other numbness  Psychiatric/Behavioral: Negative for worsening agitation or other fatigue All other symptoms neg per pt    Objective:   Physical Exam BP 124/62   Pulse 84   Temp 98.1 F (36.7 C) (Oral)   Resp 16   Wt 123 lb (55.8 kg)   SpO2 93% Comment: 2 lpm via [place]  BMI 19.85 kg/m  VS noted, in wheelchair Constitutional: Pt appears in NAD HENT: Head: NCAT.  Right Ear: External ear normal.  Left Ear: External ear normal.  Eyes: . Pupils are equal, round, and reactive to light. Conjunctivae and EOM are normal Nose: without d/c or deformity Neck: Neck supple. Gross normal ROM Cardiovascular: Normal rate and regular rhythm.   Pulmonary/Chest: Effort normal and breath sounds without rales or wheezing.  Abd:  Soft, NT, ND, + BS, no organomegaly Neurological: Pt is alert. At baseline orientation, motor grossly intact Skin: Skin is warm. No rashes, other new lesions, no LE edema Psychiatric: Pt behavior is normal without agitation , 1-2+ nervous No other exam findings Lab Results  Component Value Date   WBC 10.1 02/10/2018   HGB 12.7 02/10/2018   HCT 38.4 02/10/2018   PLT 222.0 02/10/2018   GLUCOSE 85 02/10/2018   CHOL 164 02/10/2018   TRIG 173.0 (H) 02/10/2018   HDL 87.70 02/10/2018   LDLDIRECT 49.0 02/05/2016   LDLCALC 42 02/10/2018   ALT 6 02/10/2018   AST 10 02/10/2018   NA 144 02/10/2018   K 3.7 02/10/2018   CL 101 02/10/2018   CREATININE 0.89 02/10/2018   BUN 10 02/10/2018   CO2 37 (H) 02/10/2018   TSH 2.54 02/10/2018   INR 0.87 11/15/2016   HGBA1C 5.7 02/10/2018          Assessment & Plan:

## 2018-02-17 ENCOUNTER — Encounter: Payer: Self-pay | Admitting: Internal Medicine

## 2018-02-17 NOTE — Assessment & Plan Note (Signed)

## 2018-02-17 NOTE — Telephone Encounter (Signed)
Notified Alexis Higgins/pharmacist w/MD response.Marland KitchenJohny Chess

## 2018-02-17 NOTE — Assessment & Plan Note (Signed)
For celexa 10 qd 

## 2018-02-17 NOTE — Assessment & Plan Note (Signed)
stable overall by history and exam, recent data reviewed with pt, and pt to continue medical treatment as before,  to f/u any worsening symptoms or concerns  

## 2018-02-17 NOTE — Telephone Encounter (Signed)
Ok to use in this case, thanks

## 2018-02-20 NOTE — Telephone Encounter (Signed)
Husband calling to advise after the first dose on Sat am of the  clonazePAM (KLONOPIN) 0.5 MG tablet, pt got very sleepy and slept the rest of the day.   He could barely wake her from sleep. physically unable to stand alone. Sat nite, for the 2nd dose, pt slept all night. (also takes a sleep med). but  On Sunday after taking, pt actually was slurring her words, and if husband had not been there, she could not stand by herself.  Pt was very irritable and did not want to be bothered.  Pt also slept all day Sunday. Pt had stopped taking hydroxyzine in favor of this.  Husband states he did not give to her last night or this morning.  Pt seems better this am, but still having problems walking/transferring on her on. Pt is still awake this morning. Mr Hickling states he could see the difference after taking that first dose. He states the med "just took over" He is going to wait to hear from the dr before he does anything else. Please advise.

## 2018-02-20 NOTE — Telephone Encounter (Signed)
Ok to try HALF tabs intead of whole tabs, but may not be able to tolerate this, and may need change to ativan or xanax

## 2018-02-20 NOTE — Telephone Encounter (Signed)
Notified pt/husband with MD response.Marland KitchenJohny Chess

## 2018-02-22 MED ORDER — HYDROXYZINE HCL 25 MG PO TABS: ORAL_TABLET | ORAL | 5 refills | Status: DC

## 2018-02-22 NOTE — Telephone Encounter (Signed)
Ok to change back to the hydroxyzine - I will do erx

## 2018-02-22 NOTE — Telephone Encounter (Signed)
Patient's husband, Percell Miller called back and said she took 1/2 a dosage yesterday ( it took a little bit longer, but still made her sleep, she could hardly get up by herself to move to the bathroom.) " It made a wreck of her " he says. He said he is going to stop the medication. He said right now he wants to wait awhile on giving her anything else, to fully get back on her feet. Thanks. ( he is thinking of later starting back the hydrOXYzine (ATARAX/VISTARIL) 10 MG tablet [[id number]].   If you need to call him back you can reach him @ [phone]

## 2018-02-22 NOTE — Addendum Note (Signed)
Addended by: Biagio Borg on: 02/22/2018 12:52 PM   Modules accepted: Orders

## 2018-02-28 ENCOUNTER — Other Ambulatory Visit: Payer: Self-pay | Admitting: Internal Medicine

## 2018-03-09 ENCOUNTER — Ambulatory Visit: Payer: Medicare Other | Admitting: Cardiovascular Disease

## 2018-03-09 ENCOUNTER — Encounter: Payer: Self-pay | Admitting: Cardiovascular Disease

## 2018-03-09 VITALS — BP 130/76 | HR 85 | Ht 66.0 in | Wt 122.0 lb

## 2018-03-09 DIAGNOSIS — I10 Essential (primary) hypertension: Secondary | ICD-10-CM | POA: Diagnosis not present

## 2018-03-09 DIAGNOSIS — I2510 Atherosclerotic heart disease of native coronary artery without angina pectoris: Secondary | ICD-10-CM | POA: Diagnosis not present

## 2018-03-09 DIAGNOSIS — E782 Mixed hyperlipidemia: Secondary | ICD-10-CM | POA: Diagnosis not present

## 2018-03-09 NOTE — Patient Instructions (Signed)

## 2018-03-09 NOTE — Progress Notes (Signed)
Chief Complaint  Patient presents with  . Follow-up    CAD    History of Present Illness: 77 yo female with history of CAD, PAD, HTN and severe COPD who is here today for cardiac follow up. She had a bare metal stent placed in her LAD in 2010 and a drug eluting stent placed in the LAD in 2011 due to in stent restenosis. She was admitted to [facility] in 2013 with left shoulder pain that was reminiscent of her previous angina. Cardiac markers were negative. Cardiac cath November 2013 with patent LAD stent, mild RCA disease. She was placed on isosorbide and her chest pain . She is followed in the pulmonary office for her severe COPD. She was admitted to [facility] August 2018 with chest pain in setting of palpitations. She was seen by EP, Dr. Lovena Le. There was no documented arrhythmia. Her beta blocker was increased.  Normal ABI April 2019.   She is here today for follow up. The patient denies any chest pain, palpitations, lower extremity edema, orthopnea, PND, dizziness, near syncope or syncope. She has ongoing dyspnea.   Primary Care Physician: Biagio Borg, MD  Past Medical History:  Diagnosis Date  . Abnormal chest x-ray    03/2012: will need OP f/u.  Marland Kitchen Anginal pain ([place])   . ANXIETY 01/01/2007  . Blood transfusion without reported diagnosis 2015  . BURSITIS, RIGHT HIP 06/04/2009  . CAD (coronary artery disease)    a. BMS to LAD 2010. b. NSTEMI with DES to LAD for ISR 2011. c. Patent stent 03/2012/Imdur added.  . Cataract   . CHEST PAIN-PRECORDIAL 01/15/2009  . Colon polyps    H/o tubular adenoma of colon  . COPD 01/01/2007   a. Chronic resp failure on home O2.  Marland Kitchen DEPRESSION 01/01/2007  . Eczema 01/08/2011  . GROIN PAIN 06/20/2008  . Headache(784.0) 01/01/2007  . Hemorrhoid   . HYPERTENSION 01/01/2007  . Impaired glucose tolerance 01/07/2011  . LOW BACK PAIN 01/01/2007  . Muscle weakness (generalized) 06/04/2009  . On home oxygen therapy    "2L; 24/7" (09/10/2016)  . OSTEOARTHRITIS, HIP 07/01/2008    . OSTEOPOROSIS 01/01/2007  . Oxygen deficiency    O2 2 liters 24/7  . Pneumonia 1998  . Rosacea 01/08/2011  . Shortness of breath   . Stroke [facility])    TIA  . SYNCOPE 01/01/2007  . Thoracic compression fracture ([place])   . TIA (transient ischemic attack)   . TRANSIENT ISCHEMIC ATTACK, HX OF 01/01/2007    Past Surgical History:  Procedure Laterality Date  . ABDOMINAL HYSTERECTOMY    . APPENDECTOMY    . BACK SURGERY    . BREAST ENHANCEMENT SURGERY    . COLONOSCOPY  01/25/2002   tubular adenoma,hemorrhoids, hyperplastic  colon polyps  . COLONOSCOPY  02/17/2005   hemorrhoids  . CORONARY ANGIOPLASTY    . CORONARY STENT PLACEMENT    . ESOPHAGOGASTRODUODENOSCOPY (EGD) WITH PROPOFOL N/A 08/10/2017   Procedure: ESOPHAGOGASTRODUODENOSCOPY (EGD) WITH PROPOFOL;  Surgeon: Gatha Mayer, MD;  Location: [facility];  Service: Endoscopy;  Laterality: N/A;  . HIP ARTHROPLASTY Left 10/01/2013   Procedure: ARTHROPLASTY UNIPOLAR   HIP;  Surgeon: Marianna Payment, MD;  Location: [place];  Service: Orthopedics;  Laterality: Left;  . HIP SURGERY Left    DR XU     PROXIMAL NECK   . IR GENERIC HISTORICAL  02/19/2016   IR VERTEBROPLASTY CERV/THOR BX INC UNI/BIL INC/INJECT/IMAGING 02/19/2016 Luanne Bras, MD [facility]INTER[facility] RAD  . IR  GENERIC HISTORICAL  07/15/2016   IR KYPHO THORACIC WITH BONE BIOPSY 07/15/2016 Luanne Bras, MD [facility]  . IR KYPHO EA ADDL LEVEL THORACIC OR LUMBAR  09/14/2016  . IR RADIOLOGIST EVAL & MGMT  09/22/2016  . IR VERTEBROPLASTY CERV/THOR BX INC UNI/BIL INC/INJECT/IMAGING  10/08/2016  . IR VERTEBROPLASTY CERV/THOR BX INC UNI/BIL INC/INJECT/IMAGING  11/16/2016  . LEFT HEART CATHETERIZATION WITH CORONARY ANGIOGRAM N/A 04/13/2012   Procedure: LEFT HEART CATHETERIZATION WITH CORONARY ANGIOGRAM;  Surgeon: Burnell Blanks, MD;  Location: [facility] CATH LAB;  Service: Cardiovascular;  Laterality: N/A;  . OOPHORECTOMY     one ovary  . s/p bilat cataract  2010  . sp lumbar disc surgury      [name]    Current Outpatient Medications  Medication Sig Dispense Refill  . acetaminophen (TYLENOL) 500 MG tablet Take 1,000 mg by mouth every 6 (six) hours as needed for moderate pain or headache.     . Cholecalciferol (VITAMIN D) 1000 UNITS capsule Take 1,000 Units by mouth every evening.     . clopidogrel (PLAVIX) 75 MG tablet TAKE 1 TABLET BY MOUTH EVERY DAY 90 tablet 0  . diclofenac sodium (VOLTAREN) 1 % GEL Apply topically as needed.     . docusate sodium (COLACE) 100 MG capsule Take 100 mg by mouth daily as needed for mild constipation.     . furosemide (LASIX) 20 MG tablet Take 1 tablet (20 mg total) by mouth 2 (two) times daily as needed. 180 tablet 3  . hydrOXYzine (ATARAX/VISTARIL) 25 MG tablet TAKE 1 OR 2 TABLETS BY MOUTH EVERY 8 HOURS AS NEEDED FOR ANXIETY 540 tablet 2  . isosorbide mononitrate (IMDUR) 30 MG 24 hr tablet TAKE 0.5 TABLETS (15 MG TOTAL) BY MOUTH DAILY. 30 tablet 0  . Lidocaine HCl (PAIN RELIEF ROLL-ON EX) Apply 1 application topically 2 (two) times daily as needed (for pain).     Marland Kitchen lovastatin (MEVACOR) 20 MG tablet TAKE 20 MG BY MOUTH AT BEDTIME 90 tablet 1  . metoprolol tartrate (LOPRESSOR) 25 MG tablet TAKE 1 TABLET BY MOUTH TWICE A DAY 180 tablet 3  . nitroGLYCERIN (NITROSTAT) 0.4 MG SL tablet Place 1 tablet (0.4 mg total) under the tongue every 5 (five) minutes as needed for chest pain (up to 3 doses). 25 tablet 4  . ondansetron (ZOFRAN) 4 MG tablet Take 1 tablet (4 mg total) by mouth every 8 (eight) hours as needed for nausea or vomiting. 11 tablet 0  . OXYGEN Inhale 2 L into the lungs continuous.     . polyethylene glycol (MIRALAX / GLYCOLAX) packet Take 17 g by mouth daily as needed for mild constipation.    . polyvinyl alcohol (LIQUIFILM TEARS) 1.4 % ophthalmic solution Place 1 drop into both eyes daily as needed for dry eyes.     . potassium chloride (K-DUR) 10 MEQ tablet Take 1 tablet (10 mEq total) by mouth daily. 90 tablet 3  . predniSONE  (DELTASONE) 10 MG tablet TAKE 1 1/2 TABLET BY MOUTH DAILY WITH BREAKFAST (Patient taking differently: TAKE 15 MG BY MOUTH DAILY WITH BREAKFAST) 45 tablet 5  . PROAIR HFA 108 (90 Base) MCG/ACT inhaler INHALE 2 PUFFS INTO THE LUNGS EVERY 6 (SIX) HOURS AS NEEDED FOR WHEEZING OR SHORTNESS OF BREATH. 25.5 Inhaler 2  . tiotropium (SPIRIVA HANDIHALER) 18 MCG inhalation capsule Place 1 capsule (18 mcg total) into inhaler and inhale daily at 6 (six) AM. 90 capsule 3  . traMADol (ULTRAM) 50 MG tablet Take  100 mg by mouth every 6 (six) hours as needed for moderate pain.     . traZODone (DESYREL) 50 MG tablet Take 50 mg by mouth at bedtime.    . vitamin B-12 (CYANOCOBALAMIN) 1000 MCG tablet Take 1,000 mcg by mouth daily.    . vitamin C (ASCORBIC ACID) 500 MG tablet Take 500 mg by mouth 2 (two) times daily.     No current facility-administered medications for this visit.     Allergies  Allergen Reactions  . Aspirin Other (See Comments)     cns bleed risk  . Codeine Anaphylaxis and Rash  . Other Other (See Comments)    Stiolto - severe reaction - caused inability to breath    Social History   Socioeconomic History  . Marital status: Married    Spouse name: Not on file  . Number of children: 4  . Years of education: Not on file  . Highest education level: Not on file  Occupational History  . Occupation: disabled back since 2003  Social Needs  . Financial resource strain: Not on file  . Food insecurity:    Worry: Not on file    Inability: Not on file  . Transportation needs:    Medical: Not on file    Non-medical: Not on file  Tobacco Use  . Smoking status: Former Smoker    Packs/day: 1.50    Years: 51.00    Pack years: 76.50    Types: Cigarettes    Last attempt to quit: 07/24/2005    Years since quitting: 12.6  . Smokeless tobacco: Never Used  Substance and Sexual Activity  . Alcohol use: No    Alcohol/week: 0.0 standard drinks  . Drug use: No  . Sexual activity: Not on file    Lifestyle  . Physical activity:    Days per week: Not on file    Minutes per session: Not on file  . Stress: Not on file  Relationships  . Social connections:    Talks on phone: Not on file    Gets together: Not on file    Attends religious service: Not on file    Active member of club or organization: Not on file    Attends meetings of clubs or organizations: Not on file    Relationship status: Not on file  . Intimate partner violence:    Fear of current or ex partner: Not on file    Emotionally abused: Not on file    Physically abused: Not on file    Forced sexual activity: Not on file  Other Topics Concern  . Not on file  Social History Narrative   Married   Disabled   Former smoker, no current tobacco EtOH or drugs    Family History  Problem Relation Age of Onset  . Osteoporosis Mother   . Heart disease Sister   . Emphysema Sister   . Seizures Sister        epilepsy  . Cardiomyopathy Sister   . Heart attack Sister   . Colon cancer Neg Hx   . Colon polyps Neg Hx   . Esophageal cancer Neg Hx   . Rectal cancer Neg Hx   . Stomach cancer Neg Hx     Review of Systems:  As stated in the HPI and otherwise negative.   BP 130/76   Pulse 85   Ht 5\' 6"  (1.676 m)   Wt 122 lb (55.3 kg)   SpO2 94%   BMI 19.69  kg/m   Physical Examination:  General: Well developed, well nourished, NAD  HEENT: OP clear, mucus membranes moist  SKIN: warm, dry. No rashes. Neuro: No focal deficits  Musculoskeletal: Muscle strength 5/5 all ext  Psychiatric: Mood and affect normal  Neck: No JVD, no carotid bruits, no thyromegaly, no lymphadenopathy.  Lungs:Clear bilaterally, no wheezes, rhonci, crackles Cardiovascular: Regular rate and rhythm. No murmurs, gallops or rubs. Abdomen:Soft. Bowel sounds present. Non-tender.  Extremities: No lower extremity edema. Pulses are 2 + in the bilateral DP/PT.  Chest CT: 06/01/12:  1. No suspicious pulmonary nodule in the left upper lobe  to correspond to the plain film abnormality which likely represented a summation of shadows. 2. Stable small pulmonary nodules in the right middle lobe. 3. Central lobular emphysema again demonstrated. 4. Coronary calcifications.  EKG:  EKG is ordered today. The ekg ordered today demonstrates NSR, rate 85 bpm. Baseline wander. Poor R wave progression in the precordial leads. Unchanged from prior EKG.   Recent Labs: 02/10/2018: ALT 6; BUN 10; Creatinine, Ser 0.89; Hemoglobin 12.7; Platelets 222.0; Potassium 3.7; Sodium 144; TSH 2.54   Lipid Panel    Component Value Date/Time   CHOL 164 02/10/2018 0945   TRIG 173.0 (H) 02/10/2018 0945   HDL 87.70 02/10/2018 0945   CHOLHDL 2 02/10/2018 0945   VLDL 34.6 02/10/2018 0945   LDLCALC 42 02/10/2018 0945   LDLDIRECT 49.0 02/05/2016 0856     Wt Readings from Last 3 Encounters:  03/09/18 122 lb (55.3 kg)  02/16/18 123 lb (55.8 kg)  12/21/17 120 lb (54.4 kg)     Other studies Reviewed: Additional studies/ records that were reviewed today include: . Review of the above records demonstrates:    Assessment and Plan:   1. CAD without angina: No chest pain.Continue Plavix, Imdur, statin and beta blocker.     2. Hyperlipidemia: Lipids controlled. Continue statin.   3. Hypertension: BP is well controlled. No changes  4. Severe COPD: she is on continuous supplemental O2.   Current medicines are reviewed at length with the patient today.  The patient does not have concerns regarding medicines.  The following changes have been made:  no change  Labs/ tests ordered today include:   No orders of the defined types were placed in this encounter.   Disposition:   FU with me in 12  months  Signed, Lauree Chandler, MD 03/09/2018 3:58 PM    [address], [place], [place]  [address] Phone: [phone]; Fax: [phone]

## 2018-03-11 DIAGNOSIS — J449 Chronic obstructive pulmonary disease, unspecified: Secondary | ICD-10-CM | POA: Diagnosis not present

## 2018-03-13 NOTE — Addendum Note (Signed)
Addended by: Jones Broom on: 03/13/2018 09:25 AM   Modules accepted: Orders

## 2018-03-14 ENCOUNTER — Ambulatory Visit (INDEPENDENT_AMBULATORY_CARE_PROVIDER_SITE_OTHER): Payer: Medicare Other | Admitting: Orthopaedic Surgery

## 2018-03-14 ENCOUNTER — Ambulatory Visit (INDEPENDENT_AMBULATORY_CARE_PROVIDER_SITE_OTHER): Payer: Medicare Other

## 2018-03-14 ENCOUNTER — Encounter (INDEPENDENT_AMBULATORY_CARE_PROVIDER_SITE_OTHER): Payer: Self-pay | Admitting: Orthopaedic Surgery

## 2018-03-14 DIAGNOSIS — M25552 Pain in left hip: Secondary | ICD-10-CM

## 2018-03-14 DIAGNOSIS — R600 Localized edema: Secondary | ICD-10-CM

## 2018-03-14 NOTE — Progress Notes (Signed)
Office Visit Note   Patient: Alexis Higgins           Date of Birth: [birth year]           MRN: [id number] Visit Date: 03/14/2018              Requested by: Biagio Borg, MD [address] [place], [place] [address] PCP: Biagio Borg, MD   Assessment & Plan: Visit Diagnoses:  1. Leg edema, left     Plan: From my standpoint reassurance was given that her pitting edema is not coming from her hip replacement.  In fact this looks to be stable and functioning well.  I do recommend Dopplers to rule out DVT.  TED hoses were provided today.  She did undergo an ABI in April with the podiatrist which was normal.  Questions encouraged and answered.  Follow-up with me as needed. Total face to face encounter time was greater than 45 minutes and over half of this time was spent in counseling and/or coordination of care.  Follow-Up Instructions: Return if symptoms worsen or fail to improve.   Orders:  Orders Placed This Encounter  Procedures  . XR HIP UNILAT W OR W/O PELVIS 2-3 VIEWS LEFT   No orders of the defined types were placed in this encounter.     Procedures: No procedures performed   Clinical Data: No additional findings.   Subjective: Chief Complaint  Patient presents with  . Left Leg - Edema    Alexis Higgins is a 77 year old female who is 5 years status post left hip hemiarthroplasty for femoral neck fracture.  She comes in today for evaluation of bilateral lower extremity pitting edema that has been going on for about 6 months.  She denies any left hip pain or any issues related to the left hip.  She denies any groin pain.  Denies any back pain.  She does have COPD and is on chronic oxygen.  She has been to her PCP as well as cardiologist and podiatrist for evaluation for this also.   Review of Systems  Constitutional: Negative.   HENT: Negative.   Eyes: Negative.   Respiratory: Negative.   Cardiovascular: Negative.   Endocrine: Negative.   Musculoskeletal:  Negative.   Neurological: Negative.   Hematological: Negative.   Psychiatric/Behavioral: Negative.   All other systems reviewed and are negative.    Objective: Vital Signs: There were no vitals taken for this visit.  Physical Exam  Constitutional: She is oriented to person, place, and time. She appears well-developed and well-nourished.  HENT:  Head: Normocephalic and atraumatic.  Eyes: EOM are normal.  Neck: Neck supple.  Pulmonary/Chest: Effort normal.  Abdominal: Soft.  Neurological: She is alert and oriented to person, place, and time.  Skin: Skin is warm. Capillary refill takes less than 2 seconds.  Psychiatric: She has a normal mood and affect. Her behavior is normal. Judgment and thought content normal.  Nursing note and vitals reviewed.   Ortho Exam Left hip exam shows a fully healed surgical scar.  Painless full rotation of the hip and movement. She has bilateral pitting edema of her extremities. Specialty Comments:  No specialty comments available.  Imaging: Xr Hip Unilat W Or W/o Pelvis 2-3 Views Left  Result Date: 03/14/2018 Stable left hip hemiarthroplasty without any evidence of complication.    PMFS History: Patient Active Problem List   Diagnosis Date Noted  . Left upper quadrant pain 12/21/2017  . 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 Insect bite, sequela 09/21/2017  . Hypokalemia 08/15/2017  . On home O2 07/27/2017  . Itching 07/08/2017  . Skin lesion 07/08/2017  . Ingrown nail 07/08/2017  . Back pain 10/06/2016  . Fracture of seventh thoracic vertebra ([place]) 10/06/2016  . Non-traumatic compression fracture of T7 thoracic vertebra   . Thoracic compression fracture ([place])   . Intractable back pain 09/10/2016  . Chest pain 09/10/2016  . Closed compression fracture of thoracic vertebra ([place])   . 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 Peripheral edema 06/15/2016  . Smoker 08/02/2014  . Peripheral vascular disease ([place])  08/02/2014  . Abdominal pain, epigastric 11/22/2013  . Black stools 11/22/2013  . Displaced fracture of left femoral neck ([place]) 09/30/2013  . Fracture of femoral neck, left, closed ([place]) 09/30/2013  . Fall at home 09/30/2013  . Head contusion 09/30/2013  . Leg weakness, bilateral 07/21/2012  . 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 Unstable angina pectoris ([place]) 04/12/2012  . Chronic respiratory failure ([place]) 02/13/2012  . Localized swelling, mass and lump, neck 01/06/2012  . CAD (coronary artery disease) 12/14/2011  . Dyspnea 11/22/2011  . Personal history of colonic polyps 09/06/2011  . Eczema 01/08/2011  . Rosacea 01/08/2011  . Dizziness - light-headed 01/08/2011  . Encounter for long-term (current) use of high-risk medication 01/08/2011  . Impaired glucose tolerance 01/07/2011  . Preventative health care 01/07/2011  . BURSITIS, RIGHT HIP 06/04/2009  . CAD, NATIVE VESSEL 01/15/2009  . Other symptoms involving cardiovascular system 01/15/2009  . CHEST PAIN-PRECORDIAL 01/15/2009  . OSTEOARTHRITIS, HIP 07/01/2008  . Hyperlipidemia 01/01/2007  . Anxiety state 01/01/2007  . 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 Essential hypertension 01/01/2007  . 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 LOW BACK PAIN 01/01/2007  . Osteoporosis 01/01/2007  . SYNCOPE 01/01/2007  . Headache(784.0) 01/01/2007  . TRANSIENT ISCHEMIC ATTACK, HX OF 01/01/2007   Past Medical History:  Diagnosis Date  . Abnormal chest x-ray    03/2012: will need OP f/u.  Marland Kitchen Anginal pain ([place])   . ANXIETY 01/01/2007  . Blood transfusion without reported diagnosis 2015  . BURSITIS, RIGHT HIP 06/04/2009  . CAD (coronary artery disease)    a. BMS to LAD 2010. b. NSTEMI with DES to LAD for ISR 2011. c. Patent stent 03/2012/Imdur added.  . Cataract   . CHEST PAIN-PRECORDIAL 01/15/2009  . Colon polyps    H/o tubular adenoma of colon  . COPD 01/01/2007   a. Chronic resp failure on home O2.    Marland Kitchen DEPRESSION 01/01/2007  . Eczema 01/08/2011  . GROIN PAIN 06/20/2008  . Headache(784.0) 01/01/2007  . Hemorrhoid   . HYPERTENSION 01/01/2007  . Impaired glucose tolerance 01/07/2011  . LOW BACK PAIN 01/01/2007  . Muscle weakness (generalized) 06/04/2009  . On home oxygen therapy    "2L; 24/7" (09/10/2016)  . OSTEOARTHRITIS, HIP 07/01/2008  . OSTEOPOROSIS 01/01/2007  . Oxygen deficiency    O2 2 liters 24/7  . Pneumonia 1998  . Rosacea 01/08/2011  . Shortness of breath   . Stroke [facility])    TIA  . SYNCOPE 01/01/2007  . Thoracic compression fracture ([place])   . TIA (transient ischemic attack)   . TRANSIENT ISCHEMIC ATTACK, HX OF 01/01/2007    Family History  Problem Relation Age of Onset  . Osteoporosis Mother   . Heart disease Sister   . Emphysema Sister   . Seizures Sister        epilepsy  .  Cardiomyopathy Sister   . Heart attack Sister   . Colon cancer Neg Hx   . Colon polyps Neg Hx   . Esophageal cancer Neg Hx   . Rectal cancer Neg Hx   . Stomach cancer Neg Hx     Past Surgical History:  Procedure Laterality Date  . ABDOMINAL HYSTERECTOMY    . APPENDECTOMY    . BACK SURGERY    . BREAST ENHANCEMENT SURGERY    . COLONOSCOPY  01/25/2002   tubular adenoma,hemorrhoids, hyperplastic  colon polyps  . COLONOSCOPY  02/17/2005   hemorrhoids  . CORONARY ANGIOPLASTY    . CORONARY STENT PLACEMENT    . ESOPHAGOGASTRODUODENOSCOPY (EGD) WITH PROPOFOL N/A 08/10/2017   Procedure: ESOPHAGOGASTRODUODENOSCOPY (EGD) WITH PROPOFOL;  Surgeon: Gatha Mayer, MD;  [facility];  Service: Endoscopy;  Laterality: N/A;  . HIP ARTHROPLASTY Left 10/01/2013   Procedure: ARTHROPLASTY UNIPOLAR   HIP;  Surgeon: Marianna Payment, MD;  Location: [place];  Service: Orthopedics;  Laterality: Left;  . HIP SURGERY Left    DR Denene Alamillo     PROXIMAL NECK   . IR GENERIC HISTORICAL  02/19/2016   IR VERTEBROPLASTY CERV/THOR BX INC UNI/BIL INC/INJECT/IMAGING 02/19/2016 Luanne Bras, MD [facility]  . IR GENERIC  HISTORICAL  07/15/2016   IR KYPHO THORACIC WITH BONE BIOPSY 07/15/2016 Luanne Bras, MD [facility]  . IR KYPHO EA ADDL LEVEL THORACIC OR LUMBAR  09/14/2016  . IR RADIOLOGIST EVAL & MGMT  09/22/2016  . IR VERTEBROPLASTY CERV/THOR BX INC UNI/BIL INC/INJECT/IMAGING  10/08/2016  . IR VERTEBROPLASTY CERV/THOR BX INC UNI/BIL INC/INJECT/IMAGING  11/16/2016  . LEFT HEART CATHETERIZATION WITH CORONARY ANGIOGRAM N/A 04/13/2012   Procedure: LEFT HEART CATHETERIZATION WITH CORONARY ANGIOGRAM;  Surgeon: Burnell Blanks, MD;  Location: [facility] CATH LAB;  Service: Cardiovascular;  Laterality: N/A;  . OOPHORECTOMY     one ovary  . s/p bilat cataract  2010  . sp lumbar disc surgury     [name]   Social History   Occupational History  . Occupation: disabled back since 2003  Tobacco Use  . Smoking status: Former Smoker    Packs/day: 1.50    Years: 51.00    Pack years: 76.50    Types: Cigarettes    Last attempt to quit: 07/24/2005    Years since quitting: 12.6  . Smokeless tobacco: Never Used  Substance and Sexual Activity  . Alcohol use: No    Alcohol/week: 0.0 standard drinks  . Drug use: No  . Sexual activity: Not on file

## 2018-03-14 NOTE — Addendum Note (Signed)
Addended byLaurann [place] on: 03/14/2018 04:31 PM   Modules accepted: Orders

## 2018-03-15 ENCOUNTER — Telehealth (INDEPENDENT_AMBULATORY_CARE_PROVIDER_SITE_OTHER): Payer: Self-pay | Admitting: *Deleted

## 2018-03-15 DIAGNOSIS — J9610 Chronic respiratory failure, unspecified whether with hypoxia or hypercapnia: Secondary | ICD-10-CM | POA: Diagnosis not present

## 2018-03-15 DIAGNOSIS — I2510 Atherosclerotic heart disease of native coronary artery without angina pectoris: Secondary | ICD-10-CM | POA: Diagnosis not present

## 2018-03-15 NOTE — Telephone Encounter (Signed)
Pt husband called and aware of appt but was not happy that appt was made in the am vs pm. Pt was under the impression that he would get a call first before scheduling, I advised him that I go thru insurance first to see if requires auth and then I get scheduled and then call pt to schedule. Pt states that is not what he wanted and to reschedule. I informed pt that I do not reschedule appts I only do the initial and the rescheduling is the pts responsibility

## 2018-03-15 NOTE — Telephone Encounter (Signed)
Pt is scheduled for ULTRASOUND (DVT) at [facility] on Thurs Oct 03/16/18 at 11:00am. IC left vm with pt to return my call for appt information.

## 2018-03-16 ENCOUNTER — Ambulatory Visit (HOSPITAL_COMMUNITY)
Admission: RE | Admit: 2018-03-16 | Discharge: 2018-03-16 | Disposition: A | Discharge status: Home / Self Care | Payer: Medicare Other | Source: Ambulatory Visit | Attending: Orthopaedic Surgery | Admitting: Orthopaedic Surgery

## 2018-03-16 ENCOUNTER — Ambulatory Visit (HOSPITAL_COMMUNITY): Payer: Medicare Other

## 2018-03-16 ENCOUNTER — Other Ambulatory Visit: Payer: Self-pay | Admitting: Cardiovascular Disease

## 2018-03-16 DIAGNOSIS — R600 Localized edema: Secondary | ICD-10-CM

## 2018-03-16 NOTE — Progress Notes (Signed)
*  Preliminary Results* Bilateral lower extremity venous duplex completed. Bilateral lower extremities are negative for deep vein thrombosis. There is no evidence of Baker's cyst bilaterally.  03/16/2018 3:29 PM Maudry Mayhew, MHA, RVT, RDCS, RDMS

## 2018-03-17 ENCOUNTER — Telehealth: Payer: Self-pay

## 2018-03-17 ENCOUNTER — Other Ambulatory Visit: Payer: Self-pay | Admitting: Family

## 2018-03-17 DIAGNOSIS — R600 Localized edema: Secondary | ICD-10-CM

## 2018-03-17 NOTE — Telephone Encounter (Signed)
Please advise per Dr. Gwynn Burly Absence. Thank you

## 2018-03-17 NOTE — Telephone Encounter (Signed)
Copied from [place] [id number]. Topic: Referral - Request for Referral >> Mar 17, 2018 10:10 AM Alexis Higgins R wrote: Has patient seen PCP for this complaint? Yes Referral for which specialty: Vein Specialist Preferred provider/office: Not Sure Reason for referral: swelling of left leg , check for blood clots legs have been swollen for so long nothing helps it go down >> Mar 17, 2018 12:12 PM [name] A wrote: LOV: 02/16/18 - DVT - 03/17/18

## 2018-03-17 NOTE — Telephone Encounter (Signed)
Referral was done as requested.

## 2018-03-20 ENCOUNTER — Telehealth: Payer: Self-pay | Admitting: Internal Medicine

## 2018-03-20 NOTE — Telephone Encounter (Signed)
Copied from [place] [id number]. Topic: Quick Communication - See Telephone Encounter >> Mar 20, 2018 11:00 AM Blase Mess A wrote: CRM for notification. See Telephone encounter for: 03/20/18.Patients husband was calling to check on the status of the referral.  Please advise.

## 2018-03-21 ENCOUNTER — Observation Stay (HOSPITAL_COMMUNITY)
Admission: EM | Admit: 2018-03-21 | Discharge: 2018-03-22 | Disposition: A | Discharge status: Home / Self Care | Payer: Medicare Other | Attending: Internal Medicine | Admitting: Internal Medicine

## 2018-03-21 ENCOUNTER — Emergency Department (HOSPITAL_COMMUNITY): Payer: Medicare Other

## 2018-03-21 ENCOUNTER — Other Ambulatory Visit: Payer: Self-pay

## 2018-03-21 ENCOUNTER — Encounter (HOSPITAL_COMMUNITY): Payer: Self-pay

## 2018-03-21 DIAGNOSIS — Z96642 Presence of left artificial hip joint: Secondary | ICD-10-CM | POA: Insufficient documentation

## 2018-03-21 DIAGNOSIS — I10 Essential (primary) hypertension: Secondary | ICD-10-CM | POA: Insufficient documentation

## 2018-03-21 DIAGNOSIS — R0789 Other chest pain: Secondary | ICD-10-CM | POA: Diagnosis not present

## 2018-03-21 DIAGNOSIS — Z993 Dependence on wheelchair: Secondary | ICD-10-CM | POA: Insufficient documentation

## 2018-03-21 DIAGNOSIS — J9610 Chronic respiratory failure, unspecified whether with hypoxia or hypercapnia: Secondary | ICD-10-CM | POA: Insufficient documentation

## 2018-03-21 DIAGNOSIS — I071 Rheumatic tricuspid insufficiency: Secondary | ICD-10-CM | POA: Insufficient documentation

## 2018-03-21 DIAGNOSIS — Z955 Presence of coronary angioplasty implant and graft: Secondary | ICD-10-CM | POA: Diagnosis not present

## 2018-03-21 DIAGNOSIS — Z8673 Personal history of transient ischemic attack (TIA), and cerebral infarction without residual deficits: Secondary | ICD-10-CM | POA: Insufficient documentation

## 2018-03-21 DIAGNOSIS — R079 Chest pain, unspecified: Secondary | ICD-10-CM | POA: Diagnosis not present

## 2018-03-21 DIAGNOSIS — G8929 Other chronic pain: Secondary | ICD-10-CM | POA: Diagnosis not present

## 2018-03-21 DIAGNOSIS — R072 Precordial pain: Secondary | ICD-10-CM | POA: Diagnosis not present

## 2018-03-21 DIAGNOSIS — Z9981 Dependence on supplemental oxygen: Secondary | ICD-10-CM | POA: Insufficient documentation

## 2018-03-21 DIAGNOSIS — F419 Anxiety disorder, unspecified: Secondary | ICD-10-CM | POA: Insufficient documentation

## 2018-03-21 DIAGNOSIS — Z7952 Long term (current) use of systemic steroids: Secondary | ICD-10-CM | POA: Insufficient documentation

## 2018-03-21 DIAGNOSIS — Z791 Long term (current) use of non-steroidal anti-inflammatories (NSAID): Secondary | ICD-10-CM | POA: Insufficient documentation

## 2018-03-21 DIAGNOSIS — J439 Emphysema, unspecified: Secondary | ICD-10-CM | POA: Diagnosis not present

## 2018-03-21 DIAGNOSIS — M1610 Unilateral primary osteoarthritis, unspecified hip: Secondary | ICD-10-CM | POA: Insufficient documentation

## 2018-03-21 DIAGNOSIS — I2510 Atherosclerotic heart disease of native coronary artery without angina pectoris: Secondary | ICD-10-CM | POA: Diagnosis not present

## 2018-03-21 DIAGNOSIS — Z885 Allergy status to narcotic agent status: Secondary | ICD-10-CM | POA: Insufficient documentation

## 2018-03-21 DIAGNOSIS — Z87891 Personal history of nicotine dependence: Secondary | ICD-10-CM | POA: Insufficient documentation

## 2018-03-21 DIAGNOSIS — J984 Other disorders of lung: Secondary | ICD-10-CM | POA: Diagnosis not present

## 2018-03-21 DIAGNOSIS — Z7951 Long term (current) use of inhaled steroids: Secondary | ICD-10-CM | POA: Diagnosis not present

## 2018-03-21 DIAGNOSIS — Z743 Need for continuous supervision: Secondary | ICD-10-CM | POA: Diagnosis not present

## 2018-03-21 DIAGNOSIS — Z9882 Breast implant status: Secondary | ICD-10-CM | POA: Diagnosis not present

## 2018-03-21 DIAGNOSIS — I2721 Secondary pulmonary arterial hypertension: Secondary | ICD-10-CM | POA: Diagnosis not present

## 2018-03-21 DIAGNOSIS — I252 Old myocardial infarction: Secondary | ICD-10-CM | POA: Diagnosis not present

## 2018-03-21 DIAGNOSIS — F329 Major depressive disorder, single episode, unspecified: Secondary | ICD-10-CM | POA: Insufficient documentation

## 2018-03-21 DIAGNOSIS — I739 Peripheral vascular disease, unspecified: Secondary | ICD-10-CM | POA: Insufficient documentation

## 2018-03-21 DIAGNOSIS — N644 Mastodynia: Secondary | ICD-10-CM | POA: Insufficient documentation

## 2018-03-21 DIAGNOSIS — Z79899 Other long term (current) drug therapy: Secondary | ICD-10-CM | POA: Diagnosis not present

## 2018-03-21 DIAGNOSIS — Z886 Allergy status to analgesic agent status: Secondary | ICD-10-CM | POA: Insufficient documentation

## 2018-03-21 DIAGNOSIS — Z7902 Long term (current) use of antithrombotics/antiplatelets: Secondary | ICD-10-CM | POA: Insufficient documentation

## 2018-03-21 DIAGNOSIS — I890 Lymphedema, not elsewhere classified: Secondary | ICD-10-CM

## 2018-03-21 DIAGNOSIS — E785 Hyperlipidemia, unspecified: Secondary | ICD-10-CM | POA: Insufficient documentation

## 2018-03-21 DIAGNOSIS — Z8249 Family history of ischemic heart disease and other diseases of the circulatory system: Secondary | ICD-10-CM | POA: Insufficient documentation

## 2018-03-21 DIAGNOSIS — M25512 Pain in left shoulder: Secondary | ICD-10-CM

## 2018-03-21 DIAGNOSIS — M549 Dorsalgia, unspecified: Secondary | ICD-10-CM

## 2018-03-21 LAB — CBC
HCT: 41.1 % (ref 36.0–46.0)
Hemoglobin: 12.2 g/dL (ref 12.0–15.0)
MCH: 27.5 pg (ref 26.0–34.0)
MCHC: 29.7 g/dL — ABNORMAL LOW (ref 30.0–36.0)
MCV: 92.6 fL (ref 80.0–100.0)
PLATELETS: 211 10*3/uL (ref 150–400)
RBC: 4.44 MIL/uL (ref 3.87–5.11)
RDW: 13.2 % (ref 11.5–15.5)
WBC: 9.1 10*3/uL (ref 4.0–10.5)
nRBC: 0 % (ref 0.0–0.2)

## 2018-03-21 LAB — I-STAT TROPONIN, ED: Troponin i, poc: 0.01 ng/mL (ref 0.00–0.08)

## 2018-03-21 LAB — BASIC METABOLIC PANEL
ANION GAP: 6 (ref 5–15)
BUN: 10 mg/dL (ref 8–23)
CALCIUM: 8.9 mg/dL (ref 8.9–10.3)
CO2: 35 mmol/L — ABNORMAL HIGH (ref 22–32)
Chloride: 102 mmol/L (ref 98–111)
Creatinine, Ser: 0.84 mg/dL (ref 0.44–1.00)
GFR calc Af Amer: >60 mL/min (ref >60)
GFR calc non Af Amer: >60 mL/min (ref >60)
GLUCOSE: 89 mg/dL (ref 70–99)
POTASSIUM: 3.6 mmol/L (ref 3.5–5.1)
SODIUM: 143 mmol/L (ref 135–145)

## 2018-03-21 MED ORDER — ACETAMINOPHEN 500 MG PO TABS
1000.0000 mg | ORAL_TABLET | Freq: Four times a day (QID) | ORAL | Status: DC | PRN
  Administered 2018-03-21: 1000 mg via ORAL
  Filled 2018-03-21: qty 2

## 2018-03-21 MED ORDER — ONDANSETRON HCL 4 MG/2ML IJ SOLN
4.0000 mg | Freq: Four times a day (QID) | INTRAMUSCULAR | Status: DC | PRN
  Administered 2018-03-21 – 2018-03-22 (×2): 4 mg via INTRAVENOUS
  Filled 2018-03-21 (×2): qty 2

## 2018-03-21 MED ORDER — ISOSORBIDE MONONITRATE ER 30 MG PO TB24
15.0000 mg | ORAL_TABLET | Freq: Every day | ORAL | Status: DC
  Administered 2018-03-21 – 2018-03-22 (×2): 15 mg via ORAL
  Filled 2018-03-21 (×2): qty 1

## 2018-03-21 MED ORDER — TRIAMCINOLONE 0.1 % CREAM:EUCERIN CREAM 1:1
TOPICAL_CREAM | Freq: Two times a day (BID) | CUTANEOUS | Status: DC
  Administered 2018-03-21: 20:00:00 via TOPICAL
  Filled 2018-03-21: qty 1

## 2018-03-21 MED ORDER — UMECLIDINIUM BROMIDE 62.5 MCG/INH IN AEPB
1.0000 | INHALATION_SPRAY | Freq: Every day | RESPIRATORY_TRACT | Status: DC
  Administered 2018-03-22: 1 via RESPIRATORY_TRACT
  Filled 2018-03-21: qty 7

## 2018-03-21 MED ORDER — CLOPIDOGREL BISULFATE 75 MG PO TABS
75.0000 mg | ORAL_TABLET | Freq: Every day | ORAL | Status: DC
  Administered 2018-03-21 – 2018-03-22 (×2): 75 mg via ORAL
  Filled 2018-03-21 (×2): qty 1

## 2018-03-21 MED ORDER — ALBUTEROL SULFATE (2.5 MG/3ML) 0.083% IN NEBU: 3.0000 mL | INHALATION_SOLUTION | Freq: Four times a day (QID) | RESPIRATORY_TRACT | Status: DC | PRN

## 2018-03-21 MED ORDER — TRAMADOL HCL 50 MG PO TABS
100.0000 mg | ORAL_TABLET | Freq: Two times a day (BID) | ORAL | Status: DC
  Administered 2018-03-21 – 2018-03-22 (×3): 100 mg via ORAL
  Filled 2018-03-21 (×3): qty 2

## 2018-03-21 MED ORDER — POLYETHYLENE GLYCOL 3350 17 G PO PACK: 17.0000 g | PACK | Freq: Every day | ORAL | Status: DC | PRN

## 2018-03-21 MED ORDER — NITROGLYCERIN 0.4 MG SL SUBL: 0.4000 mg | SUBLINGUAL_TABLET | SUBLINGUAL | Status: DC | PRN

## 2018-03-21 MED ORDER — PRAVASTATIN SODIUM 20 MG PO TABS
20.0000 mg | ORAL_TABLET | Freq: Every day | ORAL | Status: DC
  Administered 2018-03-21: 20 mg via ORAL
  Filled 2018-03-21: qty 1

## 2018-03-21 MED ORDER — DOCUSATE SODIUM 100 MG PO CAPS: 100.0000 mg | ORAL_CAPSULE | Freq: Every day | ORAL | Status: DC | PRN

## 2018-03-21 MED ORDER — METOPROLOL TARTRATE 25 MG PO TABS
25.0000 mg | ORAL_TABLET | Freq: Two times a day (BID) | ORAL | Status: DC
  Administered 2018-03-21 – 2018-03-22 (×3): 25 mg via ORAL
  Filled 2018-03-21 (×3): qty 1

## 2018-03-21 MED ORDER — PREDNISONE 5 MG PO TABS
15.0000 mg | ORAL_TABLET | Freq: Every day | ORAL | Status: DC
  Administered 2018-03-21 – 2018-03-22 (×2): 15 mg via ORAL
  Filled 2018-03-21 (×2): qty 1

## 2018-03-21 MED ORDER — TIOTROPIUM BROMIDE MONOHYDRATE 18 MCG IN CAPS: 18.0000 ug | ORAL_CAPSULE | Freq: Every day | RESPIRATORY_TRACT | Status: DC

## 2018-03-21 MED ORDER — TRAZODONE HCL 50 MG PO TABS
50.0000 mg | ORAL_TABLET | Freq: Every day | ORAL | Status: DC
  Administered 2018-03-21: 50 mg via ORAL
  Filled 2018-03-21: qty 1

## 2018-03-21 MED ORDER — ENOXAPARIN SODIUM 40 MG/0.4ML ~~LOC~~ SOLN
40.0000 mg | Freq: Every day | SUBCUTANEOUS | Status: DC
  Administered 2018-03-21: 40 mg via SUBCUTANEOUS
  Filled 2018-03-21: qty 0.4

## 2018-03-21 MED ORDER — HYDROXYZINE HCL 25 MG PO TABS
25.0000 mg | ORAL_TABLET | Freq: Three times a day (TID) | ORAL | Status: DC
  Administered 2018-03-21 – 2018-03-22 (×3): 25 mg via ORAL
  Filled 2018-03-21 (×3): qty 1

## 2018-03-21 NOTE — ED Provider Notes (Signed)
[facility] EMERGENCY DEPARTMENT Provider Note   CSN: [id number] Arrival date & time: 03/21/18  0935     History   Chief Complaint Chief Complaint  Patient presents with  . Chest Pain    HPI Alexis Higgins is a 77 y.o. female with a history of CAD s/p stent placement, PAD, HTN, and severe COPD who presents to the emergency department with a chief complaint of chest pain.  The patient endorses left-sided chest pain that she describes as feeling "like a lightening bolt" to her left breast that began yesterday.  Pain was intermittent.  States the pain would start when she would standing or exertion and resolves when sitting or laying down.  She reports the pain is significantly worsened when she awoke this morning.  Although she denies dyspnea, the patient's husband reports she had to use her albuterol inhaler almost every 2 hours yesterday and typically only uses her albuterol inhaler once at baseline.   She denies chest pain with exertion, but reports that she has had left posterior shoulder pain for the last few months that has been treated with lidocaine patches and topical analgesia.  Denies dizziness or lightheadedness, but states that she doesn't walk at baseline and uses a wheelchair.  Denies nausea, vomiting, diarrhea, abdominal pain, weakness, diaphoresis, left arm or neck pain, or numbness.   She has been on prednisone chronically for COPD and multiple compresses fractures and rib fractures earlier this year. No recent falls. Denies rib pain.   Cardiology: Dr. Angelena Form. Last appointment on 03/09/18. Per records review, she had a bare-metal stent placed in her LAD in 2010 and a drug-eluting stent placed and the LAD in 2011 due to in stent restenosis. SHe had a cardiac cath in Nov. 2013 with patient LAD stent, mild RCA disease.   The history is provided by the patient. No language interpreter was used.    Past Medical History:  Diagnosis Date  . ANXIETY  01/01/2007  . Blood transfusion without reported diagnosis 2015  . BURSITIS, RIGHT HIP 06/04/2009  . CAD (coronary artery disease)    a. BMS to LAD 2010. b. NSTEMI with DES to LAD for ISR 2011. c. Patent stent 03/2012/Imdur added.  . Cataract   . Colon polyps    H/o tubular adenoma of colon  . COPD 01/01/2007   a. Chronic resp failure on home O2.  Marland Kitchen DEPRESSION 01/01/2007  . Eczema 01/08/2011  . GROIN PAIN 06/20/2008  . Headache(784.0) 01/01/2007  . Hemorrhoid   . HYPERTENSION 01/01/2007  . Impaired glucose tolerance 01/07/2011  . LOW BACK PAIN 01/01/2007  . Muscle weakness (generalized) 06/04/2009  . On home oxygen therapy    "2L; 24/7" (03/21/2018)  . OSTEOARTHRITIS, HIP 07/01/2008  . OSTEOPOROSIS 01/01/2007  . Pneumonia 1998  . Rosacea 01/08/2011  . SYNCOPE 01/01/2007  . Thoracic compression fracture ([place])   . TRANSIENT ISCHEMIC ATTACK, HX OF 01/01/2007    Patient Active Problem List   Diagnosis Date Noted  . Lymphedema 03/21/2018  . Left upper quadrant pain 12/21/2017  . 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 Insect bite, sequela 09/21/2017  . Hypokalemia 08/15/2017  . On home O2 07/27/2017  . Itching 07/08/2017  . Skin lesion 07/08/2017  . Ingrown nail 07/08/2017  . Back pain 10/06/2016  . Fracture of seventh thoracic vertebra ([place]) 10/06/2016  . Non-traumatic compression fracture of T7 thoracic vertebra   . Thoracic compression fracture ([place])   . Intractable back pain 09/10/2016  . Closed  compression fracture of thoracic vertebra ([place])   . 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 Peripheral edema 06/15/2016  . Smoker 08/02/2014  . Peripheral vascular disease ([place]) 08/02/2014  . Abdominal pain, epigastric 11/22/2013  . Black stools 11/22/2013  . Displaced fracture of left femoral neck ([place]) 09/30/2013  . Fracture of femoral neck, left, closed ([place]) 09/30/2013  . Fall at home 09/30/2013  . Head contusion 09/30/2013  . Leg weakness, bilateral 07/21/2012   . 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 Unstable angina pectoris ([place]) 04/12/2012  . Chronic respiratory failure ([place]) 02/13/2012  . Localized swelling, mass and lump, neck 01/06/2012  . CAD (coronary artery disease) 12/14/2011  . Dyspnea 11/22/2011  . Personal history of colonic polyps 09/06/2011  . Eczema 01/08/2011  . Rosacea 01/08/2011  . Dizziness - light-headed 01/08/2011  . Encounter for long-term (current) use of high-risk medication 01/08/2011  . Impaired glucose tolerance 01/07/2011  . Preventative health care 01/07/2011  . BURSITIS, RIGHT HIP 06/04/2009  . CAD, NATIVE VESSEL 01/15/2009  . Other symptoms involving cardiovascular system 01/15/2009  . CHEST PAIN-PRECORDIAL 01/15/2009  . OSTEOARTHRITIS, HIP 07/01/2008  . Hyperlipidemia 01/01/2007  . Anxiety state 01/01/2007  . 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 Essential hypertension 01/01/2007  . 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 LOW BACK PAIN 01/01/2007  . Osteoporosis 01/01/2007  . SYNCOPE 01/01/2007  . Headache(784.0) 01/01/2007  . TRANSIENT ISCHEMIC ATTACK, HX OF 01/01/2007    Past Surgical History:  Procedure Laterality Date  . ABDOMINAL HYSTERECTOMY    . APPENDECTOMY    . BACK SURGERY    . BREAST ENHANCEMENT SURGERY    . COLONOSCOPY  01/25/2002   tubular adenoma,hemorrhoids, hyperplastic  colon polyps  . COLONOSCOPY  02/17/2005   hemorrhoids  . CORONARY ANGIOPLASTY    . CORONARY STENT PLACEMENT    . ESOPHAGOGASTRODUODENOSCOPY (EGD) WITH PROPOFOL N/A 08/10/2017   Procedure: ESOPHAGOGASTRODUODENOSCOPY (EGD) WITH PROPOFOL;  Surgeon: Gatha Mayer, MD;  Location: [facility];  Service: Endoscopy;  Laterality: N/A;  . HIP ARTHROPLASTY Left 10/01/2013   Procedure: ARTHROPLASTY UNIPOLAR   HIP;  Surgeon: Marianna Payment, MD;  Location: [facility];  Service: Orthopedics;  Laterality: Left;  . HIP SURGERY Left    DR XU     PROXIMAL NECK   . IR GENERIC HISTORICAL  02/19/2016    IR VERTEBROPLASTY CERV/THOR BX INC UNI/BIL INC/INJECT/IMAGING 02/19/2016 Luanne Bras, MD [facility]  . IR GENERIC HISTORICAL  07/15/2016   IR KYPHO THORACIC WITH BONE BIOPSY 07/15/2016 Luanne Bras, MD [facility]  . IR KYPHO EA ADDL LEVEL THORACIC OR LUMBAR  09/14/2016  . IR RADIOLOGIST EVAL & MGMT  09/22/2016  . IR VERTEBROPLASTY CERV/THOR BX INC UNI/BIL INC/INJECT/IMAGING  10/08/2016  . IR VERTEBROPLASTY CERV/THOR BX INC UNI/BIL INC/INJECT/IMAGING  11/16/2016  . KYPHOPLASTY  12/2015-10/2016 X 5   "?2lumbar & 3 thoracic"  . LEFT HEART CATHETERIZATION WITH CORONARY ANGIOGRAM N/A 04/13/2012   Procedure: LEFT HEART CATHETERIZATION WITH CORONARY ANGIOGRAM;  Surgeon: Burnell Blanks, MD;  Location: [facility] CATH LAB;  Service: Cardiovascular;  Laterality: N/A;  . OOPHORECTOMY     one ovary  . s/p bilat cataract  2010  . sp lumbar disc surgury     [name]     OB History   None      Home Medications    Prior to Admission medications   Medication Sig Start Date End Date Taking? Authorizing Provider  acetaminophen (TYLENOL) 500  MG tablet Take 1,000 mg by mouth every 6 (six) hours as needed for moderate pain or headache.    Yes [provider]  Cholecalciferol (VITAMIN D) 1000 UNITS capsule Take 1,000 Units by mouth every evening.    Yes [provider]  clopidogrel (PLAVIX) 75 MG tablet TAKE 1 TABLET BY MOUTH EVERY DAY 01/12/18  Yes Burnell Blanks, MD  diclofenac sodium (VOLTAREN) 1 % GEL Apply 2 g topically as needed (for pain).    Yes [provider]  docusate sodium (COLACE) 100 MG capsule Take 100 mg by mouth daily as needed for mild constipation.    Yes [provider]  furosemide (LASIX) 20 MG tablet Take 1 tablet (20 mg total) by mouth 2 (two) times daily as needed. 03/11/17  Yes Biagio Borg, MD  hydrOXYzine (ATARAX/VISTARIL) 25 MG tablet TAKE 1 OR 2 TABLETS BY MOUTH EVERY 8 HOURS AS NEEDED FOR ANXIETY Patient taking  differently: Take 25 mg by mouth 3 (three) times daily. Taking one tablet daily 02/28/18  Yes Biagio Borg, MD  isosorbide mononitrate (IMDUR) 30 MG 24 hr tablet TAKE 0.5 TABLETS (15 MG TOTAL) BY MOUTH DAILY. Patient taking differently: Take 15 mg by mouth daily. TAKE 0.5 TABLETS (15 MG TOTAL) BY MOUTH DAILY. 02/16/18  Yes Burnell Blanks, MD  lovastatin (MEVACOR) 20 MG tablet TAKE 20 MG BY MOUTH AT BEDTIME 02/15/18  Yes Biagio Borg, MD  metoprolol tartrate (LOPRESSOR) 25 MG tablet TAKE 1 TABLET BY MOUTH TWICE A DAY NEEDS APPT Patient taking differently: Take 25 mg by mouth 2 (two) times daily.  03/16/18  Yes Burnell Blanks, MD  nitroGLYCERIN (NITROSTAT) 0.4 MG SL tablet Place 1 tablet (0.4 mg total) under the tongue every 5 (five) minutes as needed for chest pain (up to 3 doses). 04/13/12  Yes Dunn, Dayna N, PA-C  ondansetron (ZOFRAN) 4 MG tablet Take 1 tablet (4 mg total) by mouth every 8 (eight) hours as needed for nausea or vomiting. 01/06/16  Yes Mackuen, Courteney Lyn, MD  OXYGEN Inhale 2 L into the lungs continuous.    Yes [provider]  polyethylene glycol (MIRALAX / GLYCOLAX) packet Take 17 g by mouth daily as needed for mild constipation.   Yes [provider]  polyvinyl alcohol (LIQUIFILM TEARS) 1.4 % ophthalmic solution Place 1 drop into both eyes daily as needed for dry eyes.    Yes [provider]  potassium chloride (K-DUR) 10 MEQ tablet Take 1 tablet (10 mEq total) by mouth daily. 08/15/17  Yes Biagio Borg, MD  predniSONE (DELTASONE) 10 MG tablet TAKE 1 1/2 TABLET BY MOUTH DAILY WITH BREAKFAST Patient taking differently: TAKE 15 MG BY MOUTH DAILY WITH BREAKFAST 06/07/17  Yes Collene Gobble, MD  PROAIR HFA [phone] Base) MCG/ACT inhaler INHALE 2 PUFFS INTO THE LUNGS EVERY 6 (SIX) HOURS AS NEEDED FOR WHEEZING OR SHORTNESS OF BREATH. 07/20/16  Yes Biagio Borg, MD  tiotropium (SPIRIVA HANDIHALER) 18 MCG inhalation capsule Place 1 capsule (18  mcg total) into inhaler and inhale daily at 6 (six) AM. 05/27/17  Yes Biagio Borg, MD  traMADol (ULTRAM) 50 MG tablet Take 100 mg by mouth 2 (two) times daily.    Yes [provider]  traZODone (DESYREL) 50 MG tablet Take 50 mg by mouth at bedtime.   Yes [provider]  vitamin B-12 (CYANOCOBALAMIN) 1000 MCG tablet Take 1,000 mcg by mouth daily.   Yes [provider]  vitamin C (ASCORBIC ACID) 500 MG tablet Take 500 mg by mouth 2 (two) times daily.   Yes [provider]    Family History Family History  Problem Relation Age of Onset  . Osteoporosis Mother   . Heart disease Sister   . Emphysema Sister   . Seizures Sister        epilepsy  . Cardiomyopathy Sister   . Heart attack Sister   . Colon cancer Neg Hx   . Colon polyps Neg Hx   . Esophageal cancer Neg Hx   . Rectal cancer Neg Hx   . Stomach cancer Neg Hx     Social History Social History   Tobacco Use  . Smoking status: Former Smoker    Packs/day: 1.50    Years: 51.00    Pack years: 76.50    Types: Cigarettes    Last attempt to quit: 07/24/2005    Years since quitting: 12.6  . Smokeless tobacco: Never Used  Substance Use Topics  . Alcohol use: No    Alcohol/week: 0.0 standard drinks  . Drug use: No     Allergies   Aspirin; Codeine; and Other   Review of Systems Review of Systems  Constitutional: Negative for activity change, chills and fever.  Respiratory: Negative for shortness of breath.   Cardiovascular: Positive for chest pain and leg swelling.  Gastrointestinal: Negative for abdominal pain, diarrhea, nausea and vomiting.  Genitourinary: Negative for dysuria.  Musculoskeletal: Negative for back pain.  Skin: Negative for rash.  Allergic/Immunologic: Negative for immunocompromised state.  Neurological: Negative for weakness, numbness and headaches.  Psychiatric/Behavioral: Negative for confusion.   Physical Exam Updated Vital Signs BP (!) 146/82 (BP Location:  Right Arm)   Pulse 75   Temp 98 F (36.7 C) (Oral)   Resp 16   Ht 5\' 6"  (1.676 m)   Wt 55.3 kg   SpO2 97%   BMI 19.69 kg/m   Physical Exam  Constitutional:  Non-toxic appearance. She does not appear ill. No distress.  Cachetic elderly female  HENT:  Head: Normocephalic.  Eyes: Conjunctivae are normal.  Neck: Neck supple. No JVD present.  Cardiovascular: Normal rate, regular rhythm, normal heart sounds and intact distal pulses. Exam reveals no gallop and no friction rub.  No murmur heard. Pulses:      Radial pulses are 2+ on the right side, and 2+ on the left side.       Dorsalis pedis pulses are 2+ on the right side, and 2+ on the left side.  Pulmonary/Chest: Effort normal. No stridor. No respiratory distress. She has no wheezes. She has no rhonchi. She has no rales.  Abdominal: Soft. She exhibits no distension and no mass. There is no tenderness. There is no rebound and no guarding. No hernia.  Musculoskeletal:       Left lower leg: She exhibits edema.  Pitting edema extending from the left lower leg up to the calf.  No warmth or erythema.  Neurological: She is alert.  Skin: Skin is warm. Capillary refill takes less than 2 seconds. No rash noted.  Psychiatric: Her behavior is normal.  Nursing note and vitals reviewed.  ED Treatments / Results  Labs (all labs ordered are listed, but only abnormal results are displayed) Labs Reviewed  BASIC METABOLIC PANEL - Abnormal; Notable for the following components:      Result Value   CO2 35 (*)    All other components within normal limits  CBC - Abnormal; Notable for  the following components:   MCHC 29.7 (*)    All other components within normal limits  TROPONIN I  I-STAT TROPONIN, ED    EKG None  EKG Interpretation  Date/Time: 03/21/18 9:41:14   Ventricular Rate:   77 PR Interval:   140 QRS Duration:  82 QT Interval:   388 QTC Calculation:  439 R Axis:    74 Text Interpretation:  Sinus rhythm with  PVCs   Radiology Dg Chest 2 View  Result Date: 03/21/2018 CLINICAL DATA:  Left chest pain EXAM: CHEST - 2 VIEW COMPARISON:  12/21/2017 FINDINGS: COPD with hyperinflation and prominent lung markings. Ill-defined nodular density right upper lobe corresponds to a healing third rib fracture anteriorly on prior CT of 12/25/2016. Negative for heart failure. Negative for pneumonia. Small left pleural thickening posteriorly unchanged from prior CT. Calcified breast implants. Chronic rib fractures bilaterally. Multiple thoracic compression fractures. Vertebroplasty cement in multiple fractures including T5, T6, T7-T10, T11. IMPRESSION: COPD without acute abnormality. Right upper lobe nodular density corresponds to chronic fracture of the right anterior third rib. Electronically Signed   By: Franchot Gallo M.D.   On: 03/21/2018 10:45    Procedures Procedures (including critical care time)  Medications Ordered in ED Medications  traMADol (ULTRAM) tablet 100 mg (100 mg Oral Given 03/21/18 1406)  acetaminophen (TYLENOL) tablet 1,000 mg (has no administration in time range)  isosorbide mononitrate (IMDUR) 24 hr tablet 15 mg (15 mg Oral Given 03/21/18 1407)  pravastatin (PRAVACHOL) tablet 20 mg (has no administration in time range)  metoprolol tartrate (LOPRESSOR) tablet 25 mg (25 mg Oral Given 03/21/18 1406)  nitroGLYCERIN (NITROSTAT) SL tablet 0.4 mg (has no administration in time range)  hydrOXYzine (ATARAX/VISTARIL) tablet 25 mg (has no administration in time range)  traZODone (DESYREL) tablet 50 mg (has no administration in time range)  predniSONE (DELTASONE) tablet 15 mg (has no administration in time range)  docusate sodium (COLACE) capsule 100 mg (has no administration in time range)  polyethylene glycol (MIRALAX / GLYCOLAX) packet 17 g (has no administration in time range)  clopidogrel (PLAVIX) tablet 75 mg (75 mg Oral Given 03/21/18 1407)  albuterol (PROVENTIL) (2.5 MG/3ML) 0.083% nebulizer  solution 3 mL (has no administration in time range)  enoxaparin (LOVENOX) injection 40 mg (has no administration in time range)  ondansetron (ZOFRAN) injection 4 mg (4 mg Intravenous Given 03/21/18 1406)  triamcinolone 0.1 % cream : eucerin cream, 1:1 (has no administration in time range)  umeclidinium bromide (INCRUSE ELLIPTA) 62.5 MCG/INH 1 puff (has no administration in time range)     Initial Impression / Assessment and Plan / ED Course  I have reviewed the triage vital signs and the nursing notes.  Pertinent labs & imaging results that were available during my care of the patient were reviewed by me and considered in my medical decision making (see chart for details).     77 year old history of CAD s/p stent placement, PAD, HTN, and severe COPD on home 2L O2 presenting with chest pain that began intermittently yesterday and significantly worsened today.  Brought on by exertion and alleviated with rest. Denies dyspnea, but family reports she has had to use her albuterol inhaler every 2 hours since yesterday because she was feeling winded. Typically may only use once daily. Has also had atraumatic left shoulder pain for the last few months.   She is followed by Dr. Angelena Form, cardiology. No ECHO since 2012. No stress test on file. Last heart cath in 2013 with mild RCA  disease. She has a DES stent in the LAD from 2011. She recently had DVT study of the left leg for unilateral LE edema, which was negative. EKG without acute ischemic changes and initial troponin is negative. CXR with COPD and right upper lobe nodular density corresponding to chronic fracture of the right anterior third rib.   Patient was discussed with Dr. Alvino Chapel, attending physician.  Doubt rib fracture or COPD exacerbation at this time.  Since she is high risk patient with known CAD with no recent ECHO or stress test will admit for further work-up and evaluation.  Consulted the hospitalist team and Dr. Lorin Mercy will admit.   Cardiology was also consulted and will plan to evaluate the patient.  Repeat troponin is pending. The patient appears reasonably stabilized for admission considering the current resources, flow, and capabilities available in the ED at this time, and I doubt any other [facility] requiring further screening and/or treatment in the ED prior to admission.   Final Clinical Impressions(s) / ED Diagnoses   Final diagnoses:  Chest pain, unspecified type    ED Discharge Orders    None       Joanne Gavel, PA-C 03/21/18 1753    Davonna Belling, MD 03/22/18 [phone]

## 2018-03-21 NOTE — ED Notes (Signed)
Ordered lunch tray 

## 2018-03-21 NOTE — ED Notes (Signed)
Patient transported to X-ray 

## 2018-03-21 NOTE — H&P (Signed)
History and Physical    Alexis Higgins:[id number] DOB: 1940/10/12 DOA: 03/21/2018  PCP: Biagio Borg, MD Consultants:  [facility] - cardiology; [facility] - orthopedics; [place] - pulmonology; [place] - podiatry; [place]; [place] - ID; [place] - pain management Patient coming from:  Home - lives with husband; NOK: Husband, [phone]   Chief Complaint: Chest pain  HPI: Alexis Higgins is a 77 y.o. 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  Yesterday, when she got out of bed she felt like something was pressing into her left breast "like a lightning bolt."  She went to the living room in her wheelchair and she sat in her chair.  The pain was intermittent throughout the day.  She slept well and it did not wake her up.  This AM, she woke up and when she tried to sit up and the more she moved,the more it hurt.  She felt like there were continuous lightning bolts hitting her.  No h/o similar.  Her last stent was placed in 2011.  She last saw Dr. Angelena Form on 10/17 and was chest pain free.  It does not appear that she has had a recent cath.  She has had a small amount of chest pain in the ER.  The pain would improve with rest.  It flared up with any exertion.  She has no been given NTG.  ED Course:  Known CAD.  Last cath 2013.  Exertional CP yesterday, today worse and constant.  Husband reports her using inhaler q2h, which is way more often than usual.  No cough, fever - doesn't appear to be COPD exacerbation.  LLE edema - negative DVT study about 1 week ago.  H/o compression/rib fractures from chronic prednisone.  Will call cards.  Review of Systems: As per HPI; otherwise review of systems reviewed and negative.   Ambulatory Status:  Wheelchair bound; transfers  Past Medical History:  Diagnosis Date  . ANXIETY 01/01/2007  . Blood transfusion without reported  diagnosis 2015  . BURSITIS, RIGHT HIP 06/04/2009  . CAD (coronary artery disease)    a. BMS to LAD 2010. b. NSTEMI with DES to LAD for ISR 2011. c. Patent stent 03/2012/Imdur added.  . Cataract   . Colon polyps    H/o tubular adenoma of colon  . COPD 01/01/2007   a. Chronic resp failure on home O2.  Marland Kitchen DEPRESSION 01/01/2007  . Eczema 01/08/2011  . GROIN PAIN 06/20/2008  . Headache(784.0) 01/01/2007  . Hemorrhoid   . HYPERTENSION 01/01/2007  . Impaired glucose tolerance 01/07/2011  . LOW BACK PAIN 01/01/2007  . Muscle weakness (generalized) 06/04/2009  . OSTEOARTHRITIS, HIP 07/01/2008  . OSTEOPOROSIS 01/01/2007  . Pneumonia 1998  . Rosacea 01/08/2011  . SYNCOPE 01/01/2007  . Thoracic compression fracture ([place])   . TRANSIENT ISCHEMIC ATTACK, HX OF 01/01/2007    Past Surgical History:  Procedure Laterality Date  . ABDOMINAL HYSTERECTOMY    . APPENDECTOMY    . BACK SURGERY    . BREAST ENHANCEMENT SURGERY    . COLONOSCOPY  01/25/2002   tubular adenoma,hemorrhoids, hyperplastic  colon polyps  . COLONOSCOPY  02/17/2005   hemorrhoids  . CORONARY ANGIOPLASTY    . CORONARY STENT PLACEMENT    . ESOPHAGOGASTRODUODENOSCOPY (EGD) WITH PROPOFOL N/A 08/10/2017   Procedure: ESOPHAGOGASTRODUODENOSCOPY (EGD) WITH PROPOFOL;  Surgeon: Gatha Mayer, MD;  Location: [facility];  Service: Endoscopy;  Laterality: N/A;  . HIP ARTHROPLASTY Left 10/01/2013   Procedure: ARTHROPLASTY UNIPOLAR   HIP;  Surgeon: Marianna Payment, MD;  Location: [facility];  Service: Orthopedics;  Laterality: Left;  . HIP SURGERY Left    DR XU     PROXIMAL NECK   . IR GENERIC HISTORICAL  02/19/2016   IR VERTEBROPLASTY CERV/THOR BX INC UNI/BIL INC/INJECT/IMAGING 02/19/2016 Luanne Bras, MD [facility]INTER[facility]  . IR GENERIC HISTORICAL  07/15/2016   IR KYPHO THORACIC WITH BONE BIOPSY 07/15/2016 Luanne Bras, MD [facility]  . IR KYPHO EA ADDL LEVEL THORACIC OR LUMBAR  09/14/2016  . IR RADIOLOGIST EVAL & MGMT  09/22/2016  . IR VERTEBROPLASTY  CERV/THOR BX INC UNI/BIL INC/INJECT/IMAGING  10/08/2016  . IR VERTEBROPLASTY CERV/THOR BX INC UNI/BIL INC/INJECT/IMAGING  11/16/2016  . LEFT HEART CATHETERIZATION WITH CORONARY ANGIOGRAM N/A 04/13/2012   Procedure: LEFT HEART CATHETERIZATION WITH CORONARY ANGIOGRAM;  Surgeon: Burnell Blanks, MD;  Location: [facility] CATH LAB;  Service: Cardiovascular;  Laterality: N/A;  . OOPHORECTOMY     one ovary  . s/p bilat cataract  2010  . sp lumbar disc surgury     [name]    Social History   Socioeconomic History  . Marital status: Married    Spouse name: Not on file  . Number of children: 4  . Years of education: Not on file  . Highest education level: Not on file  Occupational History  . Occupation: disabled back since 2003  Social Needs  . Financial resource strain: Not on file  . Food insecurity:    Worry: Not on file    Inability: Not on file  . Transportation needs:    Medical: Not on file    Non-medical: Not on file  Tobacco Use  . Smoking status: Former Smoker    Packs/day: 1.50    Years: 51.00    Pack years: 76.50    Types: Cigarettes    Last attempt to quit: 07/24/2005    Years since quitting: 12.6  . Smokeless tobacco: Never Used  Substance and Sexual Activity  . Alcohol use: No    Alcohol/week: 0.0 standard drinks  . Drug use: No  . Sexual activity: Not on file  Lifestyle  . Physical activity:    Days per week: Not on file    Minutes per session: Not on file  . Stress: Not on file  Relationships  . Social connections:    Talks on phone: Not on file    Gets together: Not on file    Attends religious service: Not on file    Active member of club or organization: Not on file    Attends meetings of clubs or organizations: Not on file    Relationship status: Not on file  . Intimate partner violence:    Fear of current or ex partner: Not on file    Emotionally abused: Not on file    Physically abused: Not on file    Forced sexual activity: Not on file    Other Topics Concern  . Not on file  Social History Narrative   Married   Disabled   Former smoker, no current tobacco EtOH or drugs    Allergies  Allergen Reactions  . Aspirin Other (See Comments)     cns bleed risk  . Codeine Anaphylaxis and Rash  . Other Other (See Comments)    Stiolto - severe reaction - caused inability to breath  Family History  Problem Relation Age of Onset  . Osteoporosis Mother   . Heart disease Sister   . Emphysema Sister   . Seizures Sister        epilepsy  . Cardiomyopathy Sister   . Heart attack Sister   . Colon cancer Neg Hx   . Colon polyps Neg Hx   . Esophageal cancer Neg Hx   . Rectal cancer Neg Hx   . Stomach cancer Neg Hx     Prior to Admission medications   Medication Sig Start Date End Date Taking? Authorizing Provider  acetaminophen (TYLENOL) 500 MG tablet Take 1,000 mg by mouth every 6 (six) hours as needed for moderate pain or headache.     [provider]  Cholecalciferol (VITAMIN D) 1000 UNITS capsule Take 1,000 Units by mouth every evening.     [provider]  clopidogrel (PLAVIX) 75 MG tablet TAKE 1 TABLET BY MOUTH EVERY DAY 01/12/18   Burnell Blanks, MD  diclofenac sodium (VOLTAREN) 1 % GEL Apply topically as needed.     [provider]  docusate sodium (COLACE) 100 MG capsule Take 100 mg by mouth daily as needed for mild constipation.     [provider]  furosemide (LASIX) 20 MG tablet Take 1 tablet (20 mg total) by mouth 2 (two) times daily as needed. 03/11/17   Biagio Borg, MD  hydrOXYzine (ATARAX/VISTARIL) 25 MG tablet TAKE 1 OR 2 TABLETS BY MOUTH EVERY 8 HOURS AS NEEDED FOR ANXIETY 02/28/18   Biagio Borg, MD  isosorbide mononitrate (IMDUR) 30 MG 24 hr tablet TAKE 0.5 TABLETS (15 MG TOTAL) BY MOUTH DAILY. 02/16/18   Burnell Blanks, MD  Lidocaine HCl (PAIN RELIEF ROLL-ON EX) Apply 1 application topically 2 (two) times daily as needed (for pain).     [provider]  lovastatin (MEVACOR) 20 MG tablet TAKE 20 MG BY MOUTH AT BEDTIME 02/15/18   Biagio Borg, MD  metoprolol tartrate (LOPRESSOR) 25 MG tablet TAKE 1 TABLET BY MOUTH TWICE A DAY NEEDS APPT 03/16/18   Burnell Blanks, MD  nitroGLYCERIN (NITROSTAT) 0.4 MG SL tablet Place 1 tablet (0.4 mg total) under the tongue every 5 (five) minutes as needed for chest pain (up to 3 doses). 04/13/12   Dunn, Nedra Hai, PA-C  ondansetron (ZOFRAN) 4 MG tablet Take 1 tablet (4 mg total) by mouth every 8 (eight) hours as needed for nausea or vomiting. 01/06/16   Mackuen, Courteney Lyn, MD  OXYGEN Inhale 2 L into the lungs continuous.     [provider]  polyethylene glycol (MIRALAX / GLYCOLAX) packet Take 17 g by mouth daily as needed for mild constipation.    [provider]  polyvinyl alcohol (LIQUIFILM TEARS) 1.4 % ophthalmic solution Place 1 drop into both eyes daily as needed for dry eyes.     [provider]  potassium chloride (K-DUR) 10 MEQ tablet Take 1 tablet (10 mEq total) by mouth daily. 08/15/17   Biagio Borg, MD  predniSONE (DELTASONE) 10 MG tablet TAKE 1 1/2 TABLET BY MOUTH DAILY WITH BREAKFAST Patient taking differently: TAKE 15 MG BY MOUTH DAILY WITH BREAKFAST 06/07/17   Collene Gobble, MD  PROAIR HFA 108 [phone] Base) MCG/ACT inhaler INHALE 2 PUFFS INTO THE LUNGS EVERY 6 (SIX) HOURS AS NEEDED FOR WHEEZING OR SHORTNESS OF BREATH. 07/20/16   Biagio Borg, MD  tiotropium (SPIRIVA HANDIHALER) 18 MCG inhalation capsule Place 1 capsule (18 mcg  total) into inhaler and inhale daily at 6 (six) AM. 05/27/17   Biagio Borg, MD  traMADol (ULTRAM) 50 MG tablet Take 100 mg by mouth every 6 (six) hours as needed for moderate pain.     [provider]  traZODone (DESYREL) 50 MG tablet Take 50 mg by mouth at bedtime.    [provider]  vitamin B-12 (CYANOCOBALAMIN) 1000 MCG tablet Take 1,000 mcg by mouth daily.    [provider]  vitamin C (ASCORBIC  ACID) 500 MG tablet Take 500 mg by mouth 2 (two) times daily.    [provider]    Physical Exam: Vitals:   03/21/18 0945 03/21/18 0951 03/21/18 1041 03/21/18 1222  BP:    133/89  Pulse:    76  Resp:    20  Temp:      TempSrc:      SpO2:   96% 100%  Weight:  55.3 kg    Height: 5\' 6"  (1.676 m) 5\' 6"  (1.676 m)       General:  Appears calm and comfortable and is NAD Eyes:  PERRL, EOMI, normal lids, iris ENT:  grossly normal hearing, lips & tongue, mmm Neck:  no LAD, masses or thyromegaly; no carotid bruits Cardiovascular:  RRR, no m/r/g.  Breast implants. Respiratory:   CTA bilaterally with no wheezes/rales/rhonchi.  Normal respiratory effort. Abdomen:  soft, NT, ND, NABS Skin:  Scattered petechial lesions on LE, mild LLE erythema Musculoskeletal: atrophic LE with LLE edema; UE strength 4/5 Psychiatric: blunted mood and affect, speech fluent and appropriate, AOx3 Neurologic: CN 2-12 grossly intact, moves upper extremities in coordinated fashion    Radiological Exams on Admission: Dg Chest 2 View  Result Date: 03/21/2018 CLINICAL DATA:  Left chest pain EXAM: CHEST - 2 VIEW COMPARISON:  12/21/2017 FINDINGS: COPD with hyperinflation and prominent lung markings. Ill-defined nodular density right upper lobe corresponds to a healing third rib fracture anteriorly on prior CT of 12/25/2016. Negative for heart failure. Negative for pneumonia. Small left pleural thickening posteriorly unchanged from prior CT. Calcified breast implants. Chronic rib fractures bilaterally. Multiple thoracic compression fractures. Vertebroplasty cement in multiple fractures including T5, T6, T7-T10, T11. IMPRESSION: COPD without acute abnormality. Right upper lobe nodular density corresponds to chronic fracture of the right anterior third rib. Electronically Signed   By: Franchot Gallo M.D.   On: 03/21/2018 10:45    EKG: Independently reviewed.  NSR with rate 77; PVCs; nonspecific ST changes with no  evidence of acute ischemia   Labs on Admission: I have personally reviewed the available labs and imaging studies at the time of the admission.  Pertinent labs:   CO2 35, BMP otherwise WNL Troponin 0.01 CBC essentially normal   Assessment/Plan Principal Problem:   CHEST PAIN-PRECORDIAL Active Problems:   Hyperlipidemia   Essential hypertension   COPD (chronic obstructive pulmonary disease) with emphysema (HCC)   Chronic respiratory failure (HCC)   Peripheral vascular disease (HCC)   Lymphedema   Chest pain -Patient with left-sided sharp chest pain that started yesterday and was exertional; today it has been constant but seems to be improved spontaneously. -1/3 typical symptoms suggestive of noncardiac chest pain.  -CXR unremarkable.   -Initial cardiac troponin negative.  -EKG not indicative of acute ischemia.   -She has a known h/o CAD and has not had recent ischemic testing and so this might be indicated.  -Will plan to place in observation status on telemetry to rule out ACS by overnight observation.  -  cycle troponin q6h x 3 and repeat EKG in AM -Continue ASA 81 mg  daily -Cardiology consultation requested -Continue Plavix, Imdur  Chronic respiratory failure associated with severe COPD -Patient is on chronic home O2 and does not appear to have worsening symptoms at this time.  -She does not have fever or leukocytosis. Chest x-ray is not consistent with pneumonia -Continue prn albuterol -Continue Spiriva -Continue home prednisone 15 mg daily  Lymphedema with h/o PVD -Patient with LLE edema that is subacute -She has a h/o hip surgery on that side -Suspect underlying lymphedema, possibly with superimposed venous stasis (although somewhat unusual to be unilateral) -She has mild erythema but no current concern for cellulitis, suspect mild stasis dermatitis -Will order LLE compression stocking and Eucerin:TAC -Recent DVT US negative (10/24) -Generally stable circulation  on vascular US ABI on 4/5  HTN -Continue Lopressor  HLD -Continue Lipitor -Lipids were checked in 9/19 (TC 164, HDL 88, LDL 42, TG 173) so will not repeat at this time  DVT prophylaxis: Lovenox  Code Status:  Full - confirmed with patient/husband Family Communication: Husband present throughout evaluation Disposition Plan:  Home once clinically improved Consults called: Cardiology Admission status: It is my clinical opinion that referral for OBSERVATION is reasonable and necessary in this patient based on the above information provided. The aforementioned taken together are felt to place the patient at high risk for further clinical deterioration. However it is anticipated that the patient may be medically stable for discharge from the hospital within 24 to 48 hours.     Karmen Bongo MD [facility]  If note is complete, please contact covering daytime or nighttime physician. [email or web address] Password [email or web address]  03/21/2018, 1:59 PM

## 2018-03-21 NOTE — ED Triage Notes (Signed)
To hallway bed via EMS.  Onset yesterday chest pain.  Last chest pain today @ 8:30am, lasting 1 hour, felt like electric current shooting through left chest.  Pt on home O2 @ 2L at home.  No c/o pain at this time.

## 2018-03-21 NOTE — Consult Note (Addendum)
Cardiology Consult    Patient ID: ENGLAND GREB MRN: [id number], DOB/AGE: [birth year]   Admit date: 03/21/2018 Date of Consult: 03/21/2018  Primary Physician: Biagio Borg, MD Primary Cardiologist: Dr. Angelena Form  Requesting Provider: Dr. Lorin Mercy  Reason for Consultation: Chest pain  Alexis Higgins is a 77 y.o. female who is being seen today for the evaluation of chest pain at the request of Dr. Lorin Mercy.   Patient Profile    77 yo female with PMH of CAD, PAD, HTN, TIA and COPD who presented with chest pain.   Past Medical History   Past Medical History:  Diagnosis Date  . ANXIETY 01/01/2007  . Blood transfusion without reported diagnosis 2015  . BURSITIS, RIGHT HIP 06/04/2009  . CAD (coronary artery disease)    a. BMS to LAD 2010. b. NSTEMI with DES to LAD for ISR 2011. c. Patent stent 03/2012/Imdur added.  . Cataract   . Colon polyps    H/o tubular adenoma of colon  . COPD 01/01/2007   a. Chronic resp failure on home O2.  Marland Kitchen DEPRESSION 01/01/2007  . Eczema 01/08/2011  . GROIN PAIN 06/20/2008  . Headache(784.0) 01/01/2007  . Hemorrhoid   . HYPERTENSION 01/01/2007  . Impaired glucose tolerance 01/07/2011  . LOW BACK PAIN 01/01/2007  . Muscle weakness (generalized) 06/04/2009  . OSTEOARTHRITIS, HIP 07/01/2008  . OSTEOPOROSIS 01/01/2007  . Pneumonia 1998  . Rosacea 01/08/2011  . SYNCOPE 01/01/2007  . Thoracic compression fracture ([place])   . TRANSIENT ISCHEMIC ATTACK, HX OF 01/01/2007    Past Surgical History:  Procedure Laterality Date  . ABDOMINAL HYSTERECTOMY    . APPENDECTOMY    . BACK SURGERY    . BREAST ENHANCEMENT SURGERY    . COLONOSCOPY  01/25/2002   tubular adenoma,hemorrhoids, hyperplastic  colon polyps  . COLONOSCOPY  02/17/2005   hemorrhoids  . CORONARY ANGIOPLASTY    . CORONARY STENT PLACEMENT    . ESOPHAGOGASTRODUODENOSCOPY (EGD) WITH PROPOFOL N/A 08/10/2017   Procedure: ESOPHAGOGASTRODUODENOSCOPY (EGD) WITH PROPOFOL;  Surgeon: Gatha Mayer, MD;  Location: [facility]  ENDOSCOPY;  Service: Endoscopy;  Laterality: N/A;  . HIP ARTHROPLASTY Left 10/01/2013   Procedure: ARTHROPLASTY UNIPOLAR   HIP;  Surgeon: Marianna Payment, MD;  Location: [facility];  Service: Orthopedics;  Laterality: Left;  . HIP SURGERY Left    DR XU     PROXIMAL NECK   . IR GENERIC HISTORICAL  02/19/2016   IR VERTEBROPLASTY CERV/THOR BX INC UNI/BIL INC/INJECT/IMAGING 02/19/2016 Luanne Bras, MD [facility]-INTER[facility] RAD  . IR GENERIC HISTORICAL  07/15/2016   IR KYPHO THORACIC WITH BONE BIOPSY 07/15/2016 Luanne Bras, MD [facility]  . IR KYPHO EA ADDL LEVEL THORACIC OR LUMBAR  09/14/2016  . IR RADIOLOGIST EVAL & MGMT  09/22/2016  . IR VERTEBROPLASTY CERV/THOR BX INC UNI/BIL INC/INJECT/IMAGING  10/08/2016  . IR VERTEBROPLASTY CERV/THOR BX INC UNI/BIL INC/INJECT/IMAGING  11/16/2016  . LEFT HEART CATHETERIZATION WITH CORONARY ANGIOGRAM N/A 04/13/2012   Procedure: LEFT HEART CATHETERIZATION WITH CORONARY ANGIOGRAM;  Surgeon: Burnell Blanks, MD;  Location: [facility] CATH LAB;  Service: Cardiovascular;  Laterality: N/A;  . OOPHORECTOMY     one ovary  . s/p bilat cataract  2010  . sp lumbar disc surgury     [name]     Allergies  Allergies  Allergen Reactions  . Aspirin Other (See Comments)     cns bleed risk  . Codeine Anaphylaxis and Rash  . Other Other (See Comments)    Stiolto -  severe reaction - caused inability to breath    History of Present Illness    Alexis Higgins is a 77 yo female with PMH of CAD, PAD, HTN, TIA and COPD. She had a BMS placed back in 2010 to the LAD and then a DES to the LAD in 2011 2/2 to ISR. She was admitted back in 2013 with left shoulder pain that was similar to her previous angina. She ruled out and was placed on Imdur. Known to have severe COPD and follow by pulmonary. Requires the use of home O2. Again admitted back in 2018 with chest pain in the setting of palpitations and seen by EP. No noted arrhythmias that admission and her BB was increased. She had  normal ABIs back in April this year. Echo from back in 2012 showed normal EF.  She was recently seen in the office on 03/09/18 by Dr. Angelena Form and reported being in her usual state of health. No chest pain, palpitations, or LE edema. She was continued on plavix, imdur, statin and BB. Planned to follow up in one year.   She reports feeling unwell for the past 2 days.  Developed left-sided chest and breast pain yesterday morning which is sharp in nature.  States it feels like a "lightening bolt" whenever she has the sensation.  Episodes come with both exertion and rest.  She has not tried any medication at home to help alleviate symptoms.  In comparison to her previous anginal symptoms with her stents reports this is significantly different.  With her LAD stent she experienced significant chest discomfort and pressure, along with nausea and shortness of breath.  States it is difficult to say whether she feels more short of breath than usual as she is dyspneic at her baseline with her COPD.  No palpitations, lightheadedness, nausea or vomiting associated with her chest discomfort.  Symptoms lasted off and on throughout the day yesterday, and she believes woke her up from sleep this morning.  She also complains of worsening back pain, and has a history of multiple kyphoplasty's.  Does report worsening chest discomfort with movement of her left arm, and palpation. Overall has just generally felt unwell for the past several days.  She has been trying to use her inhaler more often, without significant relief of symptoms.  She is on chronic oxygen therapy of 2 L and denies having to increase her O2 need at home. Has developed swelling in the LLE, but reports having a doppler about a week ago that was negative.   In the ED her labs showed stable electrolytes, POC troponin negative, hemoglobin 12.2.  Chest x-ray showed no COPD with a right lower lobe nodule density consistent with chronic fracture of her anterior rib.   EKG showed sinus rhythm with PVC.  She is very tearful at the time of assessment, but denies currently any chest pain.  Inpatient Medications      Family History    Family History  Problem Relation Age of Onset  . Osteoporosis Mother   . Heart disease Sister   . Emphysema Sister   . Seizures Sister        epilepsy  . Cardiomyopathy Sister   . Heart attack Sister   . Colon cancer Neg Hx   . Colon polyps Neg Hx   . Esophageal cancer Neg Hx   . Rectal cancer Neg Hx   . Stomach cancer Neg Hx     Social History    Social History   Socioeconomic  History  . Marital status: Married    Spouse name: Not on file  . Number of children: 4  . Years of education: Not on file  . Highest education level: Not on file  Occupational History  . Occupation: disabled back since 2003  Social Needs  . Financial resource strain: Not on file  . Food insecurity:    Worry: Not on file    Inability: Not on file  . Transportation needs:    Medical: Not on file    Non-medical: Not on file  Tobacco Use  . Smoking status: Former Smoker    Packs/day: 1.50    Years: 51.00    Pack years: 76.50    Types: Cigarettes    Last attempt to quit: 07/24/2005    Years since quitting: 12.6  . Smokeless tobacco: Never Used  Substance and Sexual Activity  . Alcohol use: No    Alcohol/week: 0.0 standard drinks  . Drug use: No  . Sexual activity: Not on file  Lifestyle  . Physical activity:    Days per week: Not on file    Minutes per session: Not on file  . Stress: Not on file  Relationships  . Social connections:    Talks on phone: Not on file    Gets together: Not on file    Attends religious service: Not on file    Active member of club or organization: Not on file    Attends meetings of clubs or organizations: Not on file    Relationship status: Not on file  . Intimate partner violence:    Fear of current or ex partner: Not on file    Emotionally abused: Not on file    Physically abused: Not  on file    Forced sexual activity: Not on file  Other Topics Concern  . Not on file  Social History Narrative   Married   Disabled   Former smoker, no current tobacco EtOH or drugs     Review of Systems    See HPI  All other systems reviewed and are otherwise negative except as noted above.  Physical Exam    Blood pressure 133/89, pulse 76, temperature 98.5 F (36.9 C), temperature source Oral, resp. rate 20, height 5\' 6"  (1.676 m), weight 55.3 kg, SpO2 100 %.  General: Pleasant, frail older WF, NAD. Wearing [place] without respiratory distress.  Psych: Normal affect. Neuro: Alert and oriented X 3. Moves all extremities spontaneously. HEENT: Normal  Neck: Supple without bruits or JVD. Lungs:  Resp regular and unlabored, CTA. Heart: RRR no s3, s4, or murmurs. Abdomen: Soft, non-tender, non-distended, BS + x 4.  Extremities: LLE 1+ chronic edema, 2+ pedal pulses.  Labs    Troponin (Point of Care Test) Recent Labs    03/21/18 1017  TROPIPOC 0.01   No results for input(s): CKTOTAL, CKMB, TROPONINI in the last 72 hours. Lab Results  Component Value Date   WBC 9.1 03/21/2018   HGB 12.2 03/21/2018   HCT 41.1 03/21/2018   MCV 92.6 03/21/2018   PLT 211 03/21/2018    Recent Labs  Lab 03/21/18 0946  NA 143  K 3.6  CL 102  CO2 35*  BUN 10  CREATININE 0.84  CALCIUM 8.9  GLUCOSE 89   Lab Results  Component Value Date   CHOL 164 02/10/2018   HDL 87.70 02/10/2018   LDLCALC 42 02/10/2018   TRIG 173.0 (H) 02/10/2018   Lab Results  Component Value Date  DDIMER 0.79 (H) 12/24/2016     Radiology Studies    Dg Chest 2 View  Result Date: 03/21/2018 CLINICAL DATA:  Left chest pain EXAM: CHEST - 2 VIEW COMPARISON:  12/21/2017 FINDINGS: COPD with hyperinflation and prominent lung markings. Ill-defined nodular density right upper lobe corresponds to a healing third rib fracture anteriorly on prior CT of 12/25/2016. Negative for heart failure. Negative for pneumonia. Small  left pleural thickening posteriorly unchanged from prior CT. Calcified breast implants. Chronic rib fractures bilaterally. Multiple thoracic compression fractures. Vertebroplasty cement in multiple fractures including T5, T6, T7-T10, T11. IMPRESSION: COPD without acute abnormality. Right upper lobe nodular density corresponds to chronic fracture of the right anterior third rib. Electronically Signed   By: Franchot Gallo M.D.   On: 03/21/2018 10:45   Xr Hip Unilat W Or W/o Pelvis 2-3 Views Left  Result Date: 03/14/2018 Stable left hip hemiarthroplasty without any evidence of complication.   ECG & Cardiac Imaging    EKG:  The EKG was personally reviewed and demonstrates SR with PVC  Echo: 01/13/11  Study Conclusions  - Left ventricle: The cavity size was normal. Wall thickness was normal. Systolic function was normal. The estimated ejection fraction was in the range of 55% to 65%. Wall motion was normal; there were no regional wall motion abnormalities. Left ventricular diastolic function parameters were normal. - Atrial septum: No defect or patent foramen ovale was identified. - Pulmonary arteries: PA peak pressure: 35mm Hg (S).  Assessment & Plan    77 yo female with PMH of CAD, PAD, HTN, TIA and COPD who presented with chest pain.  1. Atypical chest pain: reports her symptoms are very different than what she experienced with her previous stents. Pain is sharp in nature and worse with movement. Presents with rest and exertion. EKG without ischemia. Initial troponin neg. At this time, low suspicion for ACS.  -- continue plavix, statin, Imdur and BB -- cycle troponins. If troponins elevate, would consider further ischemic work up. Suspect she would not tolerate a lexiscan given her lung disease.  -- check echo  2. COPD: pretty severe, followed by pulmonary. Uses chronic O2 but has not had to increase her requirement. CXR with edema. Reports being seen by her pulmonologist back  in July without changes to her medications.  Remains on inhalers and daily steroids.   3. HTN: stable with current therapy  4. HLD: on statin therapy  Signed, Reino Bellis, NP-C Pager [phone] 03/21/2018, 12:48 PM   I have personally seen and examined this patient with Reino Bellis, NP. I agree with the assessment and plan as outlined above. I know Ms. Haith well. She has severe COPD, CAD, HTN and PAD. She has been having on/off left sided chest pain for the last 24 hours. The pain lasts from minutes to several hours. Her dyspnea has not worsened. She is on continuous supplemental O2. Recent negative lower ext venous doppler. Her first troponin is negative. Her EKG does not have ischemic changes. Last cath in 2013 with patent LAD stent and mild plaque in the RCA.  My exam:  General: Well developed, well nourished, NAD  HEENT: OP clear, mucus membranes moist  SKIN: warm, dry. No rashes. Neuro: No focal deficits  Musculoskeletal: Muscle strength 5/5 all ext  Psychiatric: Mood and affect normal  Neck: No JVD, no carotid bruits, no thyromegaly, no lymphadenopathy.  Lungs: Poor breath sounds bilaterally without wheezes or crackles.  Cardiovascular: Regular rate and rhythm. No murmurs, gallops or rubs.  Abdomen:Soft. Bowel sounds present. Non-tender.  Extremities: No lower extremity edema. Pulses are 2 + in the bilateral DP/PT. Plan:  She is being admitted with atypical chest pain. She does have CAD. I cannot exclude ischemia as the cause of her pain but there is currently no objective evidence of ischemia. I agree with admitting her to telemetry and cycle troponin. We will arrange an echo to assess LVEF and exclude a pericardial effusion. Will make a decision regarding the need for a cardiac cath tomorrow based on the findings overnight.   Lauree Chandler 03/21/2018 2:25 PM

## 2018-03-22 ENCOUNTER — Observation Stay (HOSPITAL_BASED_OUTPATIENT_CLINIC_OR_DEPARTMENT_OTHER): Payer: Medicare Other

## 2018-03-22 ENCOUNTER — Observation Stay (HOSPITAL_COMMUNITY): Payer: Medicare Other

## 2018-03-22 DIAGNOSIS — R072 Precordial pain: Secondary | ICD-10-CM | POA: Diagnosis not present

## 2018-03-22 DIAGNOSIS — I5030 Unspecified diastolic (congestive) heart failure: Secondary | ICD-10-CM | POA: Diagnosis not present

## 2018-03-22 DIAGNOSIS — J439 Emphysema, unspecified: Secondary | ICD-10-CM | POA: Diagnosis not present

## 2018-03-22 DIAGNOSIS — R079 Chest pain, unspecified: Secondary | ICD-10-CM | POA: Diagnosis not present

## 2018-03-22 DIAGNOSIS — M25512 Pain in left shoulder: Secondary | ICD-10-CM | POA: Diagnosis not present

## 2018-03-22 DIAGNOSIS — I200 Unstable angina: Secondary | ICD-10-CM | POA: Diagnosis not present

## 2018-03-22 DIAGNOSIS — J9610 Chronic respiratory failure, unspecified whether with hypoxia or hypercapnia: Secondary | ICD-10-CM | POA: Diagnosis not present

## 2018-03-22 DIAGNOSIS — M546 Pain in thoracic spine: Secondary | ICD-10-CM | POA: Diagnosis not present

## 2018-03-22 DIAGNOSIS — Z9981 Dependence on supplemental oxygen: Secondary | ICD-10-CM | POA: Diagnosis not present

## 2018-03-22 DIAGNOSIS — M79602 Pain in left arm: Secondary | ICD-10-CM | POA: Diagnosis not present

## 2018-03-22 DIAGNOSIS — Z87891 Personal history of nicotine dependence: Secondary | ICD-10-CM | POA: Diagnosis not present

## 2018-03-22 LAB — ECHOCARDIOGRAM COMPLETE
HEIGHTINCHES: 66 in
Weight: 1952 oz

## 2018-03-22 LAB — TROPONIN I: Troponin I: <0.03 ng/mL (ref <0.03)

## 2018-03-22 NOTE — Progress Notes (Signed)
2D Echocardiogram has been performed.  Alexis Higgins 03/22/2018, 9:39 AM

## 2018-03-22 NOTE — Progress Notes (Signed)
Progress Note  Patient Name: Alexis Higgins Date of Encounter: 03/22/2018  Primary Cardiologist: Lauree Chandler, MD   Subjective   No chest pain this am. She had continued chest pain last night when moving her left arm. The pain only occurs when she moves around. No change in breathing.   Inpatient Medications    Scheduled Meds: . clopidogrel  75 mg Oral Daily  . enoxaparin (LOVENOX) injection  40 mg Subcutaneous QHS  . hydrOXYzine  25 mg Oral TID  . isosorbide mononitrate  15 mg Oral Daily  . metoprolol tartrate  25 mg Oral BID  . pravastatin  20 mg Oral q1800  . predniSONE  15 mg Oral Q breakfast  . traMADol  100 mg Oral BID  . traZODone  50 mg Oral QHS  . triamcinolone 0.1 % cream : eucerin   Topical BID  . umeclidinium bromide  1 puff Inhalation Daily   Continuous Infusions:  PRN Meds: acetaminophen, albuterol, docusate sodium, nitroGLYCERIN, ondansetron (ZOFRAN) IV, polyethylene glycol   Vital Signs    Vitals:   03/21/18 1041 03/21/18 1222 03/21/18 1600 03/21/18 1635  BP:  133/89 133/74 (!) 146/82  Pulse:  76 72 75  Resp:  20 (!) 9 16  Temp:    98 F (36.7 C)  TempSrc:    Oral  SpO2: 96% 100% 97% 97%  Weight:      Height:       No intake or output data in the 24 hours ending 03/22/18 0735 Filed Weights   03/21/18 0951  Weight: 55.3 kg    Telemetry    Sinus - Personally Reviewed  ECG     - Personally Reviewed  Physical Exam   GEN: No acute distress.   Neck: No JVD Cardiac: RRR, no murmurs, rubs, or gallops.  Respiratory: decreased BS bilaterally.  GI: Soft, nontender, non-distended  MS: No edema; No deformity. Neuro:  Nonfocal  Psych: Normal affect   Labs    Chemistry Recent Labs  Lab 03/21/18 0946  NA 143  K 3.6  CL 102  CO2 35*  GLUCOSE 89  BUN 10  CREATININE 0.84  CALCIUM 8.9  GFRNONAA >60  GFRAA >60  ANIONGAP 6     Hematology Recent Labs  Lab 03/21/18 0946  WBC 9.1  RBC 4.44  HGB 12.2  HCT 41.1  MCV  92.6  MCH 27.5  MCHC 29.7*  RDW 13.2  PLT 211    Cardiac Enzymes Recent Labs  Lab 03/22/18 0125  TROPONINI <0.03    Recent Labs  Lab 03/21/18 1017  TROPIPOC 0.01     BNPNo results for input(s): BNP, PROBNP in the last 168 hours.   DDimer No results for input(s): DDIMER in the last 168 hours.   Radiology    Dg Chest 2 View  Result Date: 03/21/2018 CLINICAL DATA:  Left chest pain EXAM: CHEST - 2 VIEW COMPARISON:  12/21/2017 FINDINGS: COPD with hyperinflation and prominent lung markings. Ill-defined nodular density right upper lobe corresponds to a healing third rib fracture anteriorly on prior CT of 12/25/2016. Negative for heart failure. Negative for pneumonia. Small left pleural thickening posteriorly unchanged from prior CT. Calcified breast implants. Chronic rib fractures bilaterally. Multiple thoracic compression fractures. Vertebroplasty cement in multiple fractures including T5, T6, T7-T10, T11. IMPRESSION: COPD without acute abnormality. Right upper lobe nodular density corresponds to chronic fracture of the right anterior third rib. Electronically Signed   By: Franchot Gallo M.D.   On: 03/21/2018 10:45  Cardiac Studies     Patient Profile     77 y.o. female with CAD, COPD, PAD, HTN admitted with atypical chest pain.   Assessment & Plan    1. Atypical chest pain: Her pain occurs when she moves her left arm. This does not sound cardiac. Troponin has been negative. Her EKG shows no ischemic changes. I do not think a cardiac cath is indicated. Will review echo when completed this am. Will ask primary team to explore other causes of chest pain. This is most likely musculoskeletal pain  2. HTN: BP has been controlled overnight.   We will follow up later today after he echo is complete.    For questions or updates, please contact [place] Please consult [email or web address] for contact info under   Signed, Lauree Chandler, MD  03/22/2018, 7:35 AM

## 2018-03-22 NOTE — Discharge Summary (Addendum)
Physician Discharge Summary  Alexis Higgins MRN: [id number] DOB/AGE: [birth year] 77 y.o.  PCP: Biagio Borg, MD   Admit date: 03/21/2018 Discharge date: 03/22/2018  Discharge Diagnoses:    Principal Problem:   CHEST PAIN-PRECORDIAL Active Problems:   Hyperlipidemia   Essential hypertension   COPD (chronic obstructive pulmonary disease) with emphysema (HCC)   Chronic respiratory failure (HCC)   Peripheral vascular disease (HCC)   Lymphedema    Follow-up recommendations Follow-up with PCP in 3-5 days , including all  additional recommended appointments as below Follow-up CBC, CMP in 3-5 days Patient would benefit from MRI of the left shoulder due to suspected shoulder pathology, likely rotator cuff tendinitis, versus frozen shoulder Patient would also need further evaluation of her breast implant by a plastic surgeon -reason , left-sided chest wall pain Also recommend breast USG and if possible a breast MRI to evaluate her left chest wall pain      Allergies as of 03/22/2018      Reactions   Aspirin Other (See Comments)    cns bleed risk   Codeine Anaphylaxis, Rash   Other Other (See Comments)   Stiolto - severe reaction - caused inability to breath      Medication List    TAKE these medications   acetaminophen 500 MG tablet Commonly known as:  TYLENOL Take 1,000 mg by mouth every 6 (six) hours as needed for moderate pain or headache.   clopidogrel 75 MG tablet Commonly known as:  PLAVIX TAKE 1 TABLET BY MOUTH EVERY DAY   diclofenac sodium 1 % Gel Commonly known as:  VOLTAREN Apply 2 g topically as needed (for pain).   docusate sodium 100 MG capsule Commonly known as:  COLACE Take 100 mg by mouth daily as needed for mild constipation.   furosemide 20 MG tablet Commonly known as:  LASIX Take 1 tablet (20 mg total) by mouth 2 (two) times daily as needed.   hydrOXYzine 25 MG tablet Commonly known as:  ATARAX/VISTARIL TAKE 1 OR 2 TABLETS BY MOUTH EVERY  8 HOURS AS NEEDED FOR ANXIETY What changed:    how much to take  how to take this  when to take this  additional instructions   isosorbide mononitrate 30 MG 24 hr tablet Commonly known as:  IMDUR TAKE 0.5 TABLETS (15 MG TOTAL) BY MOUTH DAILY. What changed:  See the new instructions.   lovastatin 20 MG tablet Commonly known as:  MEVACOR TAKE 20 MG BY MOUTH AT BEDTIME   metoprolol tartrate 25 MG tablet Commonly known as:  LOPRESSOR TAKE 1 TABLET BY MOUTH TWICE A DAY NEEDS APPT What changed:  See the new instructions.   nitroGLYCERIN 0.4 MG SL tablet Commonly known as:  NITROSTAT Place 1 tablet (0.4 mg total) under the tongue every 5 (five) minutes as needed for chest pain (up to 3 doses).   ondansetron 4 MG tablet Commonly known as:  ZOFRAN Take 1 tablet (4 mg total) by mouth every 8 (eight) hours as needed for nausea or vomiting.   OXYGEN Inhale 2 L into the lungs continuous.   polyethylene glycol packet Commonly known as:  MIRALAX / GLYCOLAX Take 17 g by mouth daily as needed for mild constipation.   polyvinyl alcohol 1.4 % ophthalmic solution Commonly known as:  LIQUIFILM TEARS Place 1 drop into both eyes daily as needed for dry eyes.   potassium chloride 10 MEQ tablet Commonly known as:  K-DUR Take 1 tablet (10 mEq total) by mouth daily.  predniSONE 10 MG tablet Commonly known as:  DELTASONE TAKE 1 1/2 TABLET BY MOUTH DAILY WITH BREAKFAST What changed:  See the new instructions.   PROAIR HFA 108 (90 Base) MCG/ACT inhaler Generic drug:  albuterol INHALE 2 PUFFS INTO THE LUNGS EVERY 6 (SIX) HOURS AS NEEDED FOR WHEEZING OR SHORTNESS OF BREATH.   tiotropium 18 MCG inhalation capsule Commonly known as:  SPIRIVA Place 1 capsule (18 mcg total) into inhaler and inhale daily at 6 (six) AM.   traMADol 50 MG tablet Commonly known as:  ULTRAM Take 100 mg by mouth 2 (two) times daily.   traZODone 50 MG tablet Commonly known as:  DESYREL Take 50 mg by mouth at  bedtime.   vitamin B-12 1000 MCG tablet Commonly known as:  CYANOCOBALAMIN Take 1,000 mcg by mouth daily.   vitamin C 500 MG tablet Commonly known as:  ASCORBIC ACID Take 500 mg by mouth 2 (two) times daily.   Vitamin D 1000 units capsule Take 1,000 Units by mouth every evening.        Discharge Condition:   Discharge Instructions Get Medicines reviewed and adjusted: Please take all your medications with you for your next visit with your Primary MD  Please request your Primary MD to go over all hospital tests and procedure/radiological results at the follow up, please ask your Primary MD to get all Hospital records sent to his/her office.  If you experience worsening of your admission symptoms, develop shortness of breath, life threatening emergency, suicidal or homicidal thoughts you must seek medical attention immediately by calling 911 or calling your MD immediately  if symptoms less severe.  You must read complete instructions/literature along with all the possible adverse reactions/side effects for all the Medicines you take and that have been prescribed to you. Take any new Medicines after you have completely understood and accpet all the possible adverse reactions/side effects.   Do not drive when taking Pain medications.   Do not take more than prescribed Pain, Sleep and Anxiety Medications  Special Instructions: If you have smoked or chewed Tobacco  in the last 2 yrs please stop smoking, stop any regular Alcohol  and or any Recreational drug use.  Wear Seat belts while driving.  Please note  You were cared for by a hospitalist during your hospital stay. Once you are discharged, your primary care physician will handle any further medical issues. Please note that NO REFILLS for any discharge medications will be authorized once you are discharged, as it is imperative that you return to your primary care physician (or establish a relationship with a primary care physician  if you do not have one) for your aftercare needs so that they can reassess your need for medications and monitor your lab values.     Allergies  Allergen Reactions  . Aspirin Other (See Comments)     cns bleed risk  . Codeine Anaphylaxis and Rash  . Other Other (See Comments)    Stiolto - severe reaction - caused inability to breath      Disposition: Discharge disposition: 01-Home or Self Care        Consults:  Cardiology    Significant Diagnostic Studies:  Dg Chest 2 View  Result Date: 03/21/2018 CLINICAL DATA:  Left chest pain EXAM: CHEST - 2 VIEW COMPARISON:  12/21/2017 FINDINGS: COPD with hyperinflation and prominent lung markings. Ill-defined nodular density right upper lobe corresponds to a healing third rib fracture anteriorly on prior CT of 12/25/2016. Negative for heart  failure. Negative for pneumonia. Small left pleural thickening posteriorly unchanged from prior CT. Calcified breast implants. Chronic rib fractures bilaterally. Multiple thoracic compression fractures. Vertebroplasty cement in multiple fractures including T5, T6, T7-T10, T11. IMPRESSION: COPD without acute abnormality. Right upper lobe nodular density corresponds to chronic fracture of the right anterior third rib. Electronically Signed   By: Franchot Gallo M.D.   On: 03/21/2018 10:45   Dg Thoracic Spine 2 View  Result Date: 03/22/2018 CLINICAL DATA:  Back pain EXAM: THORACIC SPINE 2 VIEWS COMPARISON:  03/21/2018 FINDINGS: Prior multilevel vertebroplasty changes in the mid and lower thoracic spine. No acute fracture or subluxation. Diffuse osteopenia. IMPRESSION: No acute bony abnormality.  Multilevel vertebroplasty changes. Electronically Signed   By: Rolm Baptise M.D.   On: 03/22/2018 10:29   Dg Shoulder Left  Result Date: 03/22/2018 CLINICAL DATA:  Chronic left shoulder pain. EXAM: LEFT SHOULDER - 2+ VIEW COMPARISON:  None. FINDINGS: Slight joint space narrowing in the left AC and glenohumeral  joints. No acute bony abnormality. Specifically, no fracture, subluxation, or dislocation. IMPRESSION: No acute bony abnormality. Electronically Signed   By: Rolm Baptise M.D.   On: 03/22/2018 10:28   Dg Humerus Left  Result Date: 03/22/2018 CLINICAL DATA:  Chronic left shoulder and arm pain. EXAM: LEFT HUMERUS - 2+ VIEW COMPARISON:  None. FINDINGS: No acute bony abnormality. Specifically, no fracture, subluxation, or dislocation. Soft tissues are intact. IMPRESSION: No acute bony abnormality. Electronically Signed   By: Rolm Baptise M.D.   On: 03/22/2018 10:29   Xr Hip Unilat W Or W/o Pelvis 2-3 Views Left  Result Date: 03/14/2018 Stable left hip hemiarthroplasty without any evidence of complication.  Vas [place] Lower Extremity Venous (dvt)  Result Date: 03/16/2018  Lower Venous Study Indications: Swelling.  Performing Technologist: Maudry Mayhew MHA, RDMS, RVT, RDCS  Examination Guidelines: A complete evaluation includes B-mode imaging, spectral Doppler, color Doppler, and power Doppler as needed of all accessible portions of each vessel. Bilateral testing is considered an integral part of a complete examination. Limited examinations for reoccurring indications may be performed as noted.  Right Venous Findings: +---------+---------------+---------+-----------+----------+-------------------+          CompressibilityPhasicitySpontaneityPropertiesSummary             +---------+---------------+---------+-----------+----------+-------------------+ CFV      Full           Yes      Yes                                      +---------+---------------+---------+-----------+----------+-------------------+ SFJ      Full                                                             +---------+---------------+---------+-----------+----------+-------------------+ FV Prox  Full                                                              +---------+---------------+---------+-----------+----------+-------------------+ FV Mid   Full                                                             +---------+---------------+---------+-----------+----------+-------------------+  FV DistalFull                                                             +---------+---------------+---------+-----------+----------+-------------------+ PFV      Full                                                             +---------+---------------+---------+-----------+----------+-------------------+ POP      Full                                                             +---------+---------------+---------+-----------+----------+-------------------+ PTV                                                   Unable to                                                                 adequately                                                                visualize           +---------+---------------+---------+-----------+----------+-------------------+ PERO                                                  Unable to                                                                 adequately                                                                visualize           +---------+---------------+---------+-----------+----------+-------------------+  Left Venous Findings: +---------+---------------+---------+-----------+----------+-------------------+          CompressibilityPhasicitySpontaneityPropertiesSummary             +---------+---------------+---------+-----------+----------+-------------------+  CFV      Full           Yes      Yes                                      +---------+---------------+---------+-----------+----------+-------------------+ SFJ      Full                                                              +---------+---------------+---------+-----------+----------+-------------------+ FV Prox  Full                                                             +---------+---------------+---------+-----------+----------+-------------------+ FV Mid   Full                                                             +---------+---------------+---------+-----------+----------+-------------------+ FV DistalFull                                                             +---------+---------------+---------+-----------+----------+-------------------+ PFV      Full                                                             +---------+---------------+---------+-----------+----------+-------------------+ POP      Full           Yes      Yes                                      +---------+---------------+---------+-----------+----------+-------------------+ PTV                                                   Unable to                                                                 adequately  visualize           +---------+---------------+---------+-----------+----------+-------------------+ PERO                                                  Unable to                                                                 adequately                                                                visualize           +---------+---------------+---------+-----------+----------+-------------------+    Summary: Right: There is no evidence of deep vein thrombosis in the lower extremity. However, portions of this examination were limited- see technologist comments above. No cystic structure found in the popliteal fossa. Left: There is no evidence of deep vein thrombosis in the lower extremity. However, portions of this examination were limited- see technologist comments above. No cystic structure found  in the popliteal fossa.  *See table(s) above for measurements and observations. Electronically signed by Servando Snare MD on 03/16/2018 at 3:56:08 PM.    Final     echocardiogram   LV EF: 60% -   65%  ------------------------------------------------------------------- Indications:      Chest pain 786.51.  ------------------------------------------------------------------- History:   PMH:  Breast implants.  Coronary artery disease. Chronic obstructive pulmonary disease.  Risk factors: Hypertension. Dyslipidemia.  ------------------------------------------------------------------- Study Conclusions  - Left ventricle: The cavity size was normal. Wall thickness was   normal. Systolic function was normal. The estimated ejection   fraction was in the range of 60% to 65%. Wall motion was normal;   there were no regional wall motion abnormalities. Features are   consistent with a pseudonormal left ventricular filling pattern,   with concomitant abnormal relaxation and increased filling   pressure (grade 2 diastolic dysfunction). - Pulmonary arteries: Systolic pressure was mildly increased. PA   peak pressure: 38 mm Hg (S).     Filed Weights   03/21/18 0951  Weight: 55.3 kg     Microbiology: No results found for this or any previous visit (from the past 240 hour(s)).     Blood Culture    Component Value Date/Time   SDES URINE, CLEAN CATCH 11/22/2013 2000   SPECREQUEST NONE 11/22/2013 2000   CULT NO GROWTH Performed at [facility] 11/22/2013 2000   REPTSTATUS 11/24/2013 FINAL 11/22/2013 2000      Labs: Results for orders placed or performed during the hospital encounter of 03/21/18 (from the past 48 hour(s))  Basic metabolic panel     Status: Abnormal   Collection Time: 03/21/18  9:46 AM  Result Value Ref Range   Sodium 143 135 - 145 mmol/L   Potassium 3.6 3.5 - 5.1 mmol/L   Chloride 102 98 - 111 mmol/L   CO2 35 (H) 22 - 32 mmol/L   Glucose, Bld 89  70 -  99 mg/dL   BUN 10 8 - 23 mg/dL   Creatinine, Ser 0.84 0.44 - 1.00 mg/dL   Calcium 8.9 8.9 - 10.3 mg/dL   GFR calc non Af Amer >60 >60 mL/min   GFR calc Af Amer >60 >60 mL/min    Comment: (NOTE) The eGFR has been calculated using the CKD EPI equation. This calculation has not been validated in all clinical situations. eGFR's persistently <60 mL/min signify possible Chronic Kidney Disease.    Anion gap 6 5 - 15    Comment: Performed at [facility] [address], [place], [place] [address]  CBC     Status: Abnormal   Collection Time: 03/21/18  9:46 AM  Result Value Ref Range   WBC 9.1 4.0 - 10.5 K/uL   RBC 4.44 3.87 - 5.11 MIL/uL   Hemoglobin 12.2 12.0 - 15.0 g/dL   HCT 41.1 36.0 - 46.0 %   MCV 92.6 80.0 - 100.0 fL   MCH 27.5 26.0 - 34.0 pg   MCHC 29.7 (L) 30.0 - 36.0 g/dL   RDW 13.2 11.5 - 15.5 %   Platelets 211 150 - 400 K/uL   nRBC 0.0 0.0 - 0.2 %    Comment: Performed at [facility], [place] [address], [place], [place] [address]  I-stat troponin, ED     Status: None   Collection Time: 03/21/18 10:17 AM  Result Value Ref Range   Troponin i, poc 0.01 0.00 - 0.08 ng/mL   Comment 3            Comment: Due to the release kinetics of cTnI, a negative result within the first hours of the onset of symptoms does not rule out myocardial infarction with certainty. If myocardial infarction is still suspected, repeat the test at appropriate intervals.   Troponin I-serum (0, 3, 6 hours)     Status: None   Collection Time: 03/22/18  1:25 AM  Result Value Ref Range   Troponin I <0.03 <0.03 ng/mL    Comment: Performed at [facility] [address], [place], [place] [address]     Lipid Panel     Component Value Date/Time   CHOL 164 02/10/2018 0945   TRIG 173.0 (H) 02/10/2018 0945   HDL 87.70 02/10/2018 0945   CHOLHDL 2 02/10/2018 0945   VLDL 34.6 02/10/2018 0945   LDLCALC 42 02/10/2018 0945   LDLDIRECT 49.0 02/05/2016 0856     Lab Results  Component  Value Date   HGBA1C 5.7 02/10/2018   HGBA1C 5.6 08/12/2017   HGBA1C 5.7 03/08/2017     Lab Results  Component Value Date   LDLCALC 42 02/10/2018   CREATININE 0.84 03/21/2018     HPI :   77 yo female with PMH of CAD, PAD, HTN, TIA and COPD. She had a BMS placed back in 2010 to the LAD and then a DES to the LAD in 2011 2/2 to ISR. She was admitted back in 2013 with left shoulder pain that was similar to her previous angina. She ruled out and was placed on Imdur. Known to have severe COPD and follow by pulmonary. Requires the use of home O2. Again admitted back in 2018 with chest pain in the setting of palpitations and seen by EP. No noted arrhythmias that admission and her BB was increased. She had normal ABIs back in April this year. Echo from back in 2012 showed normal EF. She developed left-sided chest pain  under  Left breast pain , yesterday described as sharp in nature. She also reported chronic left shoulder pain, and multiple thoracic compression fractures with multiple kyphoplasty in the past.   In the ED her labs showed stable electrolytes, POC troponin negative, hemoglobin 12.2.  Chest x-ray showed no COPD with a right lower lobe nodule density consistent with chronic fracture of her anterior rib.  EKG showed sinus rhythm with PVC.    HOSPITAL COURSE:   Atypical chest pain  The pain lasts from minutes to several hours. Her dyspnea has not worsened. She is on continuous supplemental O2. Recent negative lower ext venous doppler 10/24 . Her  troponins  Have been negative. Her EKG does not have ischemic changes. Last cath in 2013 with patent LAD stent and mild plaque in the RCA.  Cardiology was consulted and today patient with movement of the left arm. Cardiology stated that the patient's chest pain does not sound cardiac. EKG has no ischemic changes. Troponin negative. Cardiac cath is not indicated. 2-D echo was completed that showed.no wall motion abnormalities .Moreover patient's chest  pain was thought to be musculoskeletal in nature. Continue aspirin and Plavix. Chest pain was thought to be musculoskeletal in nature, elicited by movement of the left shoulder. Range of motion of left shoulder fairly restricted. Patient also noted to have pain around her left breast implant. She has not had a mammography in a long time because of fear of pain. Her implant was placed 20 years ago. She has not had a follow-up. She would benefit from a breast ultrasound if unable to perform a mammography and follow-up with the plastic surgeon to ensure stability of the implant.      Chronic respiratory failure associated with severe COPD -Patient is on chronic home O2  2 L and does not appear to have worsening symptoms at this time.  -She does not have fever or leukocytosis. Chest x-ray is not consistent with pneumonia -Continue prn albuterol -Continue Spiriva -Continue home prednisone 15 mg daily  Lymphedema with h/o PVD -Patient with LLE edema that is subacute -She has a h/o hip surgery on that side -Suspect underlying lymphedema, possibly with superimposed venous stasis (although somewhat unusual to be unilateral) -She has mild erythema but no current concern for cellulitis, suspect mild stasis dermatitis - continue LLE compression stocking and Eucerin:TAC -Recent DVT US negative (10/24) -Generally stable circulation on vascular US ABI on 4/5  HTN -Continue Lopressor  HLD -Continue Lipitor -Lipids were checked in 9/19 (TC 164, HDL 88, LDL 42, TG 173) so will not repeat at this time   Discharge Exam:  Blood pressure (!) 161/94, pulse 77, temperature 98 F (36.7 C), temperature source Oral, resp. rate 16, height 5' 6"  (1.676 m), weight 55.3 kg, SpO2 98 %. General: Pleasant, frail older WF, NAD. Wearing [place] without respiratory distress.  Psych: Normal affect. Neuro: Alert and oriented X 3. Moves all extremities spontaneously. HEENT: Normal           Neck: Supple without bruits or  JVD. Lungs:  Resp regular and unlabored, CTA. Heart: RRR no s3, s4, or murmurs. Abdomen: Soft, non-tender, non-distended, BS + x 4.  Extremities: LLE 1+ chronic edema, 2+ pedal pulses.       SignedReyne Dumas 03/22/2018, 12:31 PM      Time needed to  prepare  discharge, discussed with the patient and family 35 minutes

## 2018-03-22 NOTE — Discharge Instructions (Signed)
Follow-up recommendations Follow-up with PCP in 3-5 days , including all  additional recommended appointments as below Follow-up CBC, CMP in 3-5 days Patient would benefit from MRI of the left shoulder due to suspected shoulder pathology, likely rotator cuff tendinitis, versus frozen shoulder Patient would also need further evaluation of her breast implant by a plastic surgeon -reason , left-sided chest wall pain Also recommend breast USG and if possible a breast MRI to evaluate her left chest wall pain

## 2018-03-22 NOTE — Care Management Obs Status (Signed)
[facility] NOTIFICATION   Patient Details  Name: EVLYN AMASON MRN: [id number] Date of Birth: [birth year]   Medicare Observation Status Notification Given:  Yes    Midge Minium RN, BSN, NCM-BC, ACM-RN [phone] 03/22/2018, 12:23 PM

## 2018-03-22 NOTE — Progress Notes (Signed)
RT note-Patient does take spiriva at home. Incruse substituted. MD in room and confirmed substitution.

## 2018-03-23 ENCOUNTER — Other Ambulatory Visit: Payer: Self-pay | Admitting: Internal Medicine

## 2018-03-23 ENCOUNTER — Telehealth: Payer: Self-pay | Admitting: Cardiovascular Disease

## 2018-03-23 NOTE — Telephone Encounter (Signed)
New Message:       Pt's spouse is calling to see if the pt needs to come back in the office to see the dr since he saw her last in the hospital.

## 2018-03-23 NOTE — Telephone Encounter (Signed)
Chart reviewed and pain was felt not to be cardiac related. I spoke with pt's husband and he confirms this.  Discharge instructions state to follow up with primary care in next few days and husband reports this appointment has been scheduled.  Instructions also state to follow up with Dr. Angelena Form but no time frame indicated.  I told pt's husband since pain was determined to not be heart related pt could follow up with Dr. Angelena Form as previously planned at recent office visit.  I told pt if primary care felt pt needed to see Dr. Angelena Form sooner to let us know and we would schedule an appointment in our office.  Husband is agreeable with this plan.

## 2018-04-04 ENCOUNTER — Other Ambulatory Visit: Payer: Self-pay | Admitting: Internal Medicine

## 2018-04-10 DIAGNOSIS — M961 Postlaminectomy syndrome, not elsewhere classified: Secondary | ICD-10-CM | POA: Diagnosis not present

## 2018-04-10 DIAGNOSIS — G4700 Insomnia, unspecified: Secondary | ICD-10-CM | POA: Diagnosis not present

## 2018-04-10 DIAGNOSIS — G894 Chronic pain syndrome: Secondary | ICD-10-CM | POA: Diagnosis not present

## 2018-04-10 DIAGNOSIS — M47817 Spondylosis without myelopathy or radiculopathy, lumbosacral region: Secondary | ICD-10-CM | POA: Diagnosis not present

## 2018-04-11 ENCOUNTER — Other Ambulatory Visit: Payer: Self-pay | Admitting: Cardiovascular Disease

## 2018-04-11 ENCOUNTER — Ambulatory Visit (INDEPENDENT_AMBULATORY_CARE_PROVIDER_SITE_OTHER): Payer: Medicare Other | Admitting: Internal Medicine

## 2018-04-11 ENCOUNTER — Encounter: Payer: Self-pay | Admitting: Internal Medicine

## 2018-04-11 VITALS — BP 142/88 | HR 88 | Temp 97.8°F | Ht 66.0 in | Wt 122.0 lb

## 2018-04-11 DIAGNOSIS — I10 Essential (primary) hypertension: Secondary | ICD-10-CM | POA: Diagnosis not present

## 2018-04-11 DIAGNOSIS — E785 Hyperlipidemia, unspecified: Secondary | ICD-10-CM

## 2018-04-11 DIAGNOSIS — J449 Chronic obstructive pulmonary disease, unspecified: Secondary | ICD-10-CM | POA: Diagnosis not present

## 2018-04-11 DIAGNOSIS — J439 Emphysema, unspecified: Secondary | ICD-10-CM

## 2018-04-11 DIAGNOSIS — R7302 Impaired glucose tolerance (oral): Secondary | ICD-10-CM

## 2018-04-11 MED ORDER — TIOTROPIUM BROMIDE MONOHYDRATE 18 MCG IN CAPS: 18.0000 ug | ORAL_CAPSULE | Freq: Every day | RESPIRATORY_TRACT | 3 refills | Status: DC

## 2018-04-11 NOTE — Assessment & Plan Note (Signed)
stable overall by history and exam, recent data reviewed with pt, and pt to continue medical treatment as before,  to f/u any worsening symptoms or concerns  

## 2018-04-11 NOTE — Patient Instructions (Addendum)
Please continue all other medications as before, and refills have been done if requested.  Please have the pharmacy call with any other refills you may need.  Please continue your efforts at being more active, low cholesterol diet, and weight control.  You are otherwise up to date with prevention measures today.  Please keep your appointments with your specialists as you may have planned   

## 2018-04-11 NOTE — Progress Notes (Signed)
Subjective:    Patient ID: Alexis Higgins, female    DOB: October 28, 1940, 77 y.o.   MRN: [id number]  HPI  Here to f/u with husband after recent hospn with CP, now resolved.  Left shoulder pain improved with voltaren gel as well.  Pt denies chest pain, increased sob or doe, wheezing, orthopnea, PND, increased LE swelling, palpitations, dizziness or syncope.  Pt denies new neurological symptoms such as new headache, or facial or extremity weakness or numbness   Pt denies polydipsia, polyuria, or low sugar symptoms such as weakness or confusion improved with po intake.  Pt states overall good compliance with meds, trying to follow lower cholesterol, diabetic diet, wt overall stable.  Did have a fall with left elbow tearing requiring EMS to get her back up from the position she fell.  No other significant in jury and denies worsening back pain Past Medical History:  Diagnosis Date  . ANXIETY 01/01/2007  . Blood transfusion without reported diagnosis 2015  . BURSITIS, RIGHT HIP 06/04/2009  . CAD (coronary artery disease)    a. BMS to LAD 2010. b. NSTEMI with DES to LAD for ISR 2011. c. Patent stent 03/2012/Imdur added.  . Cataract   . Colon polyps    H/o tubular adenoma of colon  . COPD 01/01/2007   a. Chronic resp failure on home O2.  Marland Kitchen DEPRESSION 01/01/2007  . Eczema 01/08/2011  . GROIN PAIN 06/20/2008  . Headache(784.0) 01/01/2007  . Hemorrhoid   . HYPERTENSION 01/01/2007  . Impaired glucose tolerance 01/07/2011  . LOW BACK PAIN 01/01/2007  . Muscle weakness (generalized) 06/04/2009  . On home oxygen therapy    "2L; 24/7" (03/21/2018)  . OSTEOARTHRITIS, HIP 07/01/2008  . OSTEOPOROSIS 01/01/2007  . Pneumonia 1998  . Rosacea 01/08/2011  . SYNCOPE 01/01/2007  . Thoracic compression fracture ([place])   . TRANSIENT ISCHEMIC ATTACK, HX OF 01/01/2007   Past Surgical History:  Procedure Laterality Date  . ABDOMINAL HYSTERECTOMY    . APPENDECTOMY    . BACK SURGERY    . BREAST ENHANCEMENT SURGERY    .  COLONOSCOPY  01/25/2002   tubular adenoma,hemorrhoids, hyperplastic  colon polyps  . COLONOSCOPY  02/17/2005   hemorrhoids  . CORONARY ANGIOPLASTY    . CORONARY STENT PLACEMENT    . ESOPHAGOGASTRODUODENOSCOPY (EGD) WITH PROPOFOL N/A 08/10/2017   Procedure: ESOPHAGOGASTRODUODENOSCOPY (EGD) WITH PROPOFOL;  Surgeon: Gatha Mayer, MD;  Location: [facility];  Service: Endoscopy;  Laterality: N/A;  . HIP ARTHROPLASTY Left 10/01/2013   Procedure: ARTHROPLASTY UNIPOLAR   HIP;  Surgeon: Marianna Payment, MD;  Location: [facility];  Service: Orthopedics;  Laterality: Left;  . HIP SURGERY Left    DR XU     PROXIMAL NECK   . IR GENERIC HISTORICAL  02/19/2016   IR VERTEBROPLASTY CERV/THOR BX INC UNI/BIL INC/INJECT/IMAGING 02/19/2016 Luanne Bras, MD [facility]  . IR GENERIC HISTORICAL  07/15/2016   IR KYPHO THORACIC WITH BONE BIOPSY 07/15/2016 Luanne Bras, MD [facility]  . IR KYPHO EA ADDL LEVEL THORACIC OR LUMBAR  09/14/2016  . IR RADIOLOGIST EVAL & MGMT  09/22/2016  . IR VERTEBROPLASTY CERV/THOR BX INC UNI/BIL INC/INJECT/IMAGING  10/08/2016  . IR VERTEBROPLASTY CERV/THOR BX INC UNI/BIL INC/INJECT/IMAGING  11/16/2016  . KYPHOPLASTY  12/2015-10/2016 X 5   "?2lumbar & 3 thoracic"  . LEFT HEART CATHETERIZATION WITH CORONARY ANGIOGRAM N/A 04/13/2012   Procedure: LEFT HEART CATHETERIZATION WITH CORONARY ANGIOGRAM;  Surgeon: Burnell Blanks, MD;  Location: [facility] CATH LAB;  Service: Cardiovascular;  Laterality: N/A;  . OOPHORECTOMY     one ovary  . s/p bilat cataract  2010  . sp lumbar disc surgury     [name]    reports that she quit smoking about 12 years ago. Her smoking use included cigarettes. She has a 76.50 pack-year smoking history. She has never used smokeless tobacco. She reports that she does not drink alcohol or use drugs. family history includes Cardiomyopathy in her sister; Emphysema in her sister; Heart attack in her sister; Heart disease in her sister; Osteoporosis in her  mother; Seizures in her sister. Allergies  Allergen Reactions  . Aspirin Other (See Comments)     cns bleed risk  . Codeine Anaphylaxis and Rash  . Other Other (See Comments)    Stiolto - severe reaction - caused inability to breath   Current Outpatient Medications on File Prior to Visit  Medication Sig Dispense Refill  . acetaminophen (TYLENOL) 500 MG tablet Take 1,000 mg by mouth every 6 (six) hours as needed for moderate pain or headache.     . Cholecalciferol (VITAMIN D) 1000 UNITS capsule Take 1,000 Units by mouth every evening.     . clopidogrel (PLAVIX) 75 MG tablet TAKE 1 TABLET BY MOUTH EVERY DAY 90 tablet 0  . diclofenac sodium (VOLTAREN) 1 % GEL Apply 2 g topically as needed (for pain).     Marland Kitchen diclofenac sodium (VOLTAREN) 1 % GEL APPLY 4 GRAMS TOPICALLY 4 TIMES DAILY 100 g 0  . docusate sodium (COLACE) 100 MG capsule Take 100 mg by mouth daily as needed for mild constipation.     . furosemide (LASIX) 20 MG tablet Take 1 tablet (20 mg total) by mouth 2 (two) times daily as needed. 180 tablet 3  . hydrOXYzine (ATARAX/VISTARIL) 25 MG tablet TAKE 1 OR 2 TABLETS BY MOUTH EVERY 8 HOURS AS NEEDED FOR ANXIETY (Patient taking differently: Take 25 mg by mouth 3 (three) times daily. Taking one tablet daily) 540 tablet 2  . lovastatin (MEVACOR) 20 MG tablet TAKE 20 MG BY MOUTH AT BEDTIME 90 tablet 1  . metoprolol tartrate (LOPRESSOR) 25 MG tablet TAKE 1 TABLET BY MOUTH TWICE A DAY NEEDS APPT (Patient taking differently: Take 25 mg by mouth 2 (two) times daily. ) 180 tablet 3  . nitroGLYCERIN (NITROSTAT) 0.4 MG SL tablet Place 1 tablet (0.4 mg total) under the tongue every 5 (five) minutes as needed for chest pain (up to 3 doses). 25 tablet 4  . ondansetron (ZOFRAN) 4 MG tablet Take 1 tablet (4 mg total) by mouth every 8 (eight) hours as needed for nausea or vomiting. 11 tablet 0  . OXYGEN Inhale 2 L into the lungs continuous.     . polyethylene glycol (MIRALAX / GLYCOLAX) packet Take 17 g by  mouth daily as needed for mild constipation.    . polyvinyl alcohol (LIQUIFILM TEARS) 1.4 % ophthalmic solution Place 1 drop into both eyes daily as needed for dry eyes.     . potassium chloride (K-DUR) 10 MEQ tablet Take 1 tablet (10 mEq total) by mouth daily. 90 tablet 3  . predniSONE (DELTASONE) 10 MG tablet TAKE 1 1/2 TABLET BY MOUTH DAILY WITH BREAKFAST (Patient taking differently: TAKE 15 MG BY MOUTH DAILY WITH BREAKFAST) 45 tablet 5  . PROAIR HFA 108 (90 Base) MCG/ACT inhaler INHALE 2 PUFFS INTO THE LUNGS EVERY 6 (SIX) HOURS AS NEEDED FOR WHEEZING OR SHORTNESS OF BREATH. 25.5 Inhaler 2  . tiotropium (SPIRIVA  HANDIHALER) 18 MCG inhalation capsule Place 1 capsule (18 mcg total) into inhaler and inhale daily at 6 (six) AM. 90 capsule 3  . traMADol (ULTRAM) 50 MG tablet Take 100 mg by mouth 2 (two) times daily.     . traZODone (DESYREL) 50 MG tablet Take 50 mg by mouth at bedtime.    . vitamin B-12 (CYANOCOBALAMIN) 1000 MCG tablet Take 1,000 mcg by mouth daily.    . vitamin C (ASCORBIC ACID) 500 MG tablet Take 500 mg by mouth 2 (two) times daily.     No current facility-administered medications on file prior to visit.    Review of Systems  Constitutional: Negative for other unusual diaphoresis or sweats HENT: Negative for ear discharge or swelling Eyes: Negative for other worsening visual disturbances Respiratory: Negative for stridor or other swelling  Gastrointestinal: Negative for worsening distension or other blood Genitourinary: Negative for retention or other urinary change Musculoskeletal: Negative for other MSK pain or swelling Skin: Negative for color change or other new lesions Neurological: Negative for worsening tremors and other numbness  Psychiatric/Behavioral: Negative for worsening agitation or other fatigue All other system neg per pt    Objective:   Physical Exam BP (!) 142/88   Pulse 88   Temp 97.8 F (36.6 C) (Oral)   Ht 5\' 6"  (1.676 m)   Wt 122 lb (55.3 kg)    SpO2 (!) 89%   BMI 19.69 kg/m  VS noted,  Constitutional: Pt appears in NAD HENT: Head: NCAT.  Right Ear: External ear normal.  Left Ear: External ear normal.  Eyes: . Pupils are equal, round, and reactive to light. Conjunctivae and EOM are normal Nose: without d/c or deformity Neck: Neck supple. Gross normal ROM Cardiovascular: Normal rate and regular rhythm.   Pulmonary/Chest: Effort normal and breath sounds decreased without rales or wheezing.  Neurological: Pt is alert. At baseline orientation, motor grossly intact Skin: Skin is warm. No rashes, other new lesions, no LE edema Psychiatric: Pt behavior is normal without agitation  No other exam findings Lab Results  Component Value Date   WBC 9.1 03/21/2018   HGB 12.2 03/21/2018   HCT 41.1 03/21/2018   PLT 211 03/21/2018   GLUCOSE 89 03/21/2018   CHOL 164 02/10/2018   TRIG 173.0 (H) 02/10/2018   HDL 87.70 02/10/2018   LDLDIRECT 49.0 02/05/2016   LDLCALC 42 02/10/2018   ALT 6 02/10/2018   AST 10 02/10/2018   NA 143 03/21/2018   K 3.6 03/21/2018   CL 102 03/21/2018   CREATININE 0.84 03/21/2018   BUN 10 03/21/2018   CO2 35 (H) 03/21/2018   TSH 2.54 02/10/2018   INR 0.87 11/15/2016   HGBA1C 5.7 02/10/2018   Declines further labs today      Assessment & Plan:

## 2018-04-11 NOTE — Progress Notes (Signed)
   Subjective:    Patient ID: Alexis Higgins, female    DOB: 1941/01/12, 77 y.o.   MRN: [id number]  HPI    Review of Systems     Objective:   Physical Exam        Assessment & Plan:

## 2018-04-12 ENCOUNTER — Telehealth: Payer: Self-pay | Admitting: Internal Medicine

## 2018-04-12 NOTE — Telephone Encounter (Signed)
Patient Assistance application has been completed & signed, Faxed to [phone], Copy sent to scan.   LVM to inform patient original is ready to be picked up.

## 2018-04-15 DIAGNOSIS — I2510 Atherosclerotic heart disease of native coronary artery without angina pectoris: Secondary | ICD-10-CM | POA: Diagnosis not present

## 2018-04-15 DIAGNOSIS — J9610 Chronic respiratory failure, unspecified whether with hypoxia or hypercapnia: Secondary | ICD-10-CM | POA: Diagnosis not present

## 2018-05-09 ENCOUNTER — Other Ambulatory Visit: Payer: Self-pay | Admitting: Internal Medicine

## 2018-05-11 DIAGNOSIS — J449 Chronic obstructive pulmonary disease, unspecified: Secondary | ICD-10-CM | POA: Diagnosis not present

## 2018-05-12 ENCOUNTER — Encounter: Payer: Self-pay | Admitting: Emergency Medicine

## 2018-05-12 ENCOUNTER — Ambulatory Visit: Payer: Medicare Other | Admitting: Emergency Medicine

## 2018-05-12 DIAGNOSIS — J439 Emphysema, unspecified: Secondary | ICD-10-CM

## 2018-05-12 MED ORDER — PREDNISONE 20 MG PO TABS: 20.0000 mg | ORAL_TABLET | Freq: Every day | ORAL | 2 refills | Status: DC

## 2018-05-12 NOTE — Assessment & Plan Note (Signed)
She has end-stage COPD, is having symptoms despite our best bronchodilator therapy.  I would like to try and get her on LABA but she has not tolerated thus far.  I talked her today about chronic disease progression, the fact that at some point we will be able to help her airflow and we will have to concentrate instead on symptom management.  In the meantime we will do our best to try and maximize her therapy  Please increase your prednisone to 30 mg daily for the next week.  Then decrease to 20 mg daily and stay at that dose until you follow-up. Continue Spiriva 1 inhalation daily. Keep your albuterol available use 2 puffs up to every 4 hours if needed for shortness of breath, chest tightness, wheezing. Continue your oxygen at 2 L/min at all times.  Please pay attention to your oxygen saturations.  Our goal is to keep him above 88%.  If you notice that they are dropping below 88% then call our office so that we can change your oxygen dosing We talked today about the severity of her COPD, the ramifications of progressive chronic disease.  We are going to do our best to try to improve your airflow and your breathing.  If you continue to have symptoms we will add other medications to relieve shortness of breath. Follow with Dr Lamonte Sakai in 1 month or next available

## 2018-05-12 NOTE — Patient Instructions (Signed)
Please increase your prednisone to 30 mg daily for the next week.  Then decrease to 20 mg daily and stay at that dose until you follow-up. Continue Spiriva 1 inhalation daily. Keep your albuterol available use 2 puffs up to every 4 hours if needed for shortness of breath, chest tightness, wheezing. Continue your oxygen at 2 L/min at all times.  Please pay attention to your oxygen saturations.  Our goal is to keep him above 88%.  If you notice that they are dropping below 88% then call our office so that we can change your oxygen dosing We talked today about the severity of her COPD, the ramifications of progressive chronic disease.  We are going to do our best to try to improve your airflow and your breathing.  If you continue to have symptoms we will add other medications to relieve shortness of breath. Follow with Dr Lamonte Sakai in 1 month or next available

## 2018-05-12 NOTE — Progress Notes (Signed)
   Subjective:    Patient ID: Alexis Higgins, female    DOB: [birth year], 77 y.o.   MRN: [id number]  HPI  ROV 05/12/18 --this is a follow-up visit for 77 year old woman with severe obstructive lung disease, hypertension, history of stroke, chronic hypoxemic respiratory failure.  She has some mild dysphasia, deemed to be a mild aspiration risk.  We have treated her with chronic prednisone 15 mg daily.  She is on Spiriva. Uses albuterol about 3-4x a day.  We have tried alternative bronchodilators but she has never been able to tolerate. She is on 2L/min at all times.    Review of Systems  As per HPI.   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  I would like to try and get her on LABA but she has not tolerated thus far.  I talked her today about chronic disease progression, the fact that at some point we will be able to help her airflow and we will have to concentrate instead on symptom management.  In the meantime we will do our best to try and maximize her therapy  Please increase your prednisone to 30 mg daily for the next week.  Then decrease to 20 mg daily and stay at that dose until you follow-up. Continue Spiriva 1 inhalation daily. Keep your albuterol available use 2 puffs up to every 4 hours if needed for  shortness of breath, chest tightness, wheezing. Continue your oxygen at 2 L/min at all times.  Please pay attention to your oxygen saturations.  Our goal is to keep him above 88%.  If you notice that they are dropping below 88% then call our office so that we can change your oxygen dosing We talked today about the severity of her COPD, the ramifications of progressive chronic disease.  We are going to do our best to try to improve your airflow and your breathing.  If you continue to have symptoms we will add other medications to relieve shortness of breath. Follow with Dr Lamonte Sakai in 1 month or next available   Baltazar Apo, MD, PhD 05/12/2018, 4:35 PM [facility] [phone] or if no answer [phone]

## 2018-05-15 ENCOUNTER — Telehealth: Payer: Self-pay | Admitting: Emergency Medicine

## 2018-05-15 DIAGNOSIS — J9610 Chronic respiratory failure, unspecified whether with hypoxia or hypercapnia: Secondary | ICD-10-CM | POA: Diagnosis not present

## 2018-05-15 DIAGNOSIS — I2510 Atherosclerotic heart disease of native coronary artery without angina pectoris: Secondary | ICD-10-CM | POA: Diagnosis not present

## 2018-05-15 NOTE — Telephone Encounter (Signed)
Called and spoke with Patient.  Alexis Higgins's recommendations given.  Understanding stated.  She had me hold until her Husband, Percell Miller, could come to the phone.  Gave him  Alexis Higgins's recommendations. He stated that they were just in the office Friday, and they did not need another appointment this week. Explained the need and reason Judson Roch wanted a follow up appointment.  Expressed the importance of emergency care if she gets worse. He stated understanding.  Nothing further at this time.

## 2018-05-15 NOTE — Telephone Encounter (Signed)
Have him decrease to 20 mg daily. Have him follow up later this week with an office visit. If she gets more short of breath seek emergency care.

## 2018-05-15 NOTE — Telephone Encounter (Signed)
Called and spoke with pt's husband Alexis Higgins who stated on the 30mg  prednisone, pt has been having mood issues.  Per Alexis Higgins, this all began in 2017 when pt was originally on 40mg  prednisone. Alexis Higgins stated before the prednisone was decreased down to 15mg  at this time, pt had 5 compression fractures.  Alexis Higgins stated pt had 4 more compression fractures in 2018. Alexis Higgins stated at [facility] 05/12/18, they had discussed this with RB at the [place] in regards to the pred dose. Per Alexis Higgins, pt has been having a lot of problems doing things without having to use the rescue inhaler right after she does very minimal activities.  Per Alexis Higgins, pt has been on 15mg  prednisone for about 6 months now. Alexis Higgins stated that RB said to keep pt from having to use rescue inhaler is to increase prednisone.  Pt took 30mg  prednisone Saturday and Sunday and also took 30mg  today, and pt is beginning to worry in regards to the increase of prednisone thinking she is going to have problems with pt relapsing and having the compression fractures happening again.  Alexis Higgins stated to me he was planning on dropping pt's prednisone back down to the 15mg  as she was taking before.  When speaking with Alexis Higgins, he is wanting a provider to call him directly to discuss this with him.  Sarah,  Please advise on this for pt and husband Alexis Higgins. Thanks!

## 2018-05-22 ENCOUNTER — Other Ambulatory Visit: Payer: Self-pay

## 2018-05-22 DIAGNOSIS — R600 Localized edema: Secondary | ICD-10-CM

## 2018-05-25 ENCOUNTER — Telehealth: Payer: Self-pay | Admitting: Emergency Medicine

## 2018-05-25 NOTE — Telephone Encounter (Signed)
Spoke with pt's husband. He is aware of RB's response. He became very angry with the mention of Palliative Care. Stated, "No, no we DO NOT want any kind of palliative care." Nothing further was needed.

## 2018-05-25 NOTE — Telephone Encounter (Signed)
Spoke with pt's husband. States that the pt is still having issues with Prednisone. Pt has decreased back to 15mg , she could not tolerate 30mg  daily. Her husband did call last week in regards to this and Judson Roch recommended decreasing to 20mg  daily. Husband states that we never called him back and advised him of this but there is documentation that we did. Since she is back on the 15mg  daily, she is having to use her Albuterol HFA more often. They would like RB's recommendations on what to do at this time.  RB - please advise. Thanks.

## 2018-05-25 NOTE — Telephone Encounter (Signed)
I would recommend that they stay on 15mg  pred for now as this seems to be the highest dose she can tolerate. I also believe that if she continues to have SOB that is not helped by her albuterol that we should consider symptom-based management and palliative care input, possibly nebulized or low dose PO morphine. I talked to Ms and Mr Seaborn about this at her last visit. Please ask them if they would like for me to involve Palliative Care and/or Hospice to help [place] manage her shortness of breath. If they agree then make the referral.

## 2018-05-28 ENCOUNTER — Emergency Department (HOSPITAL_COMMUNITY): Payer: Medicare Other

## 2018-05-28 ENCOUNTER — Inpatient Hospital Stay (HOSPITAL_COMMUNITY)
Admission: EM | Admit: 2018-05-28 | Discharge: 2018-05-30 | DRG: 190 | Disposition: A | Discharge status: Home / Self Care | Payer: Medicare Other | Attending: Internal Medicine | Admitting: Internal Medicine

## 2018-05-28 ENCOUNTER — Encounter (HOSPITAL_COMMUNITY): Payer: Self-pay | Admitting: *Deleted

## 2018-05-28 ENCOUNTER — Inpatient Hospital Stay (HOSPITAL_COMMUNITY): Payer: Medicare Other

## 2018-05-28 ENCOUNTER — Other Ambulatory Visit: Payer: Self-pay

## 2018-05-28 DIAGNOSIS — I2510 Atherosclerotic heart disease of native coronary artery without angina pectoris: Secondary | ICD-10-CM | POA: Diagnosis not present

## 2018-05-28 DIAGNOSIS — I2721 Secondary pulmonary arterial hypertension: Secondary | ICD-10-CM | POA: Diagnosis not present

## 2018-05-28 DIAGNOSIS — M810 Age-related osteoporosis without current pathological fracture: Secondary | ICD-10-CM | POA: Diagnosis not present

## 2018-05-28 DIAGNOSIS — F329 Major depressive disorder, single episode, unspecified: Secondary | ICD-10-CM | POA: Diagnosis present

## 2018-05-28 DIAGNOSIS — R531 Weakness: Secondary | ICD-10-CM | POA: Diagnosis not present

## 2018-05-28 DIAGNOSIS — Z7902 Long term (current) use of antithrombotics/antiplatelets: Secondary | ICD-10-CM

## 2018-05-28 DIAGNOSIS — I493 Ventricular premature depolarization: Secondary | ICD-10-CM | POA: Diagnosis present

## 2018-05-28 DIAGNOSIS — M549 Dorsalgia, unspecified: Secondary | ICD-10-CM | POA: Diagnosis not present

## 2018-05-28 DIAGNOSIS — I10 Essential (primary) hypertension: Secondary | ICD-10-CM | POA: Diagnosis not present

## 2018-05-28 DIAGNOSIS — J069 Acute upper respiratory infection, unspecified: Secondary | ICD-10-CM | POA: Diagnosis present

## 2018-05-28 DIAGNOSIS — B349 Viral infection, unspecified: Secondary | ICD-10-CM | POA: Diagnosis not present

## 2018-05-28 DIAGNOSIS — Z87891 Personal history of nicotine dependence: Secondary | ICD-10-CM

## 2018-05-28 DIAGNOSIS — R000 Tachycardia, unspecified: Secondary | ICD-10-CM | POA: Diagnosis not present

## 2018-05-28 DIAGNOSIS — I739 Peripheral vascular disease, unspecified: Secondary | ICD-10-CM | POA: Diagnosis present

## 2018-05-28 DIAGNOSIS — J9611 Chronic respiratory failure with hypoxia: Secondary | ICD-10-CM

## 2018-05-28 DIAGNOSIS — Z7952 Long term (current) use of systemic steroids: Secondary | ICD-10-CM

## 2018-05-28 DIAGNOSIS — I252 Old myocardial infarction: Secondary | ICD-10-CM

## 2018-05-28 DIAGNOSIS — J441 Chronic obstructive pulmonary disease with (acute) exacerbation: Secondary | ICD-10-CM | POA: Diagnosis present

## 2018-05-28 DIAGNOSIS — R791 Abnormal coagulation profile: Secondary | ICD-10-CM | POA: Diagnosis present

## 2018-05-28 DIAGNOSIS — R059 Cough, unspecified: Secondary | ICD-10-CM

## 2018-05-28 DIAGNOSIS — Z9981 Dependence on supplemental oxygen: Secondary | ICD-10-CM | POA: Diagnosis not present

## 2018-05-28 DIAGNOSIS — G8929 Other chronic pain: Secondary | ICD-10-CM | POA: Diagnosis present

## 2018-05-28 DIAGNOSIS — J9621 Acute and chronic respiratory failure with hypoxia: Secondary | ICD-10-CM | POA: Diagnosis not present

## 2018-05-28 DIAGNOSIS — Z8673 Personal history of transient ischemic attack (TIA), and cerebral infarction without residual deficits: Secondary | ICD-10-CM

## 2018-05-28 DIAGNOSIS — R7989 Other specified abnormal findings of blood chemistry: Secondary | ICD-10-CM | POA: Diagnosis not present

## 2018-05-28 DIAGNOSIS — J9610 Chronic respiratory failure, unspecified whether with hypoxia or hypercapnia: Secondary | ICD-10-CM | POA: Diagnosis present

## 2018-05-28 DIAGNOSIS — F419 Anxiety disorder, unspecified: Secondary | ICD-10-CM | POA: Diagnosis present

## 2018-05-28 DIAGNOSIS — R0902 Hypoxemia: Secondary | ICD-10-CM | POA: Diagnosis not present

## 2018-05-28 DIAGNOSIS — M40209 Unspecified kyphosis, site unspecified: Secondary | ICD-10-CM | POA: Diagnosis not present

## 2018-05-28 DIAGNOSIS — Z886 Allergy status to analgesic agent status: Secondary | ICD-10-CM | POA: Diagnosis not present

## 2018-05-28 DIAGNOSIS — R05 Cough: Secondary | ICD-10-CM

## 2018-05-28 DIAGNOSIS — J9622 Acute and chronic respiratory failure with hypercapnia: Secondary | ICD-10-CM | POA: Diagnosis present

## 2018-05-28 DIAGNOSIS — G9341 Metabolic encephalopathy: Secondary | ICD-10-CM | POA: Diagnosis not present

## 2018-05-28 DIAGNOSIS — Z79899 Other long term (current) drug therapy: Secondary | ICD-10-CM

## 2018-05-28 DIAGNOSIS — Z96642 Presence of left artificial hip joint: Secondary | ICD-10-CM | POA: Diagnosis present

## 2018-05-28 DIAGNOSIS — J9612 Chronic respiratory failure with hypercapnia: Secondary | ICD-10-CM | POA: Diagnosis not present

## 2018-05-28 DIAGNOSIS — R0602 Shortness of breath: Secondary | ICD-10-CM | POA: Diagnosis not present

## 2018-05-28 DIAGNOSIS — J439 Emphysema, unspecified: Secondary | ICD-10-CM | POA: Diagnosis not present

## 2018-05-28 DIAGNOSIS — Z9109 Other allergy status, other than to drugs and biological substances: Secondary | ICD-10-CM | POA: Diagnosis not present

## 2018-05-28 DIAGNOSIS — J449 Chronic obstructive pulmonary disease, unspecified: Secondary | ICD-10-CM | POA: Diagnosis not present

## 2018-05-28 DIAGNOSIS — Z885 Allergy status to narcotic agent status: Secondary | ICD-10-CM

## 2018-05-28 DIAGNOSIS — Z86010 Personal history of colonic polyps: Secondary | ICD-10-CM

## 2018-05-28 DIAGNOSIS — Z9882 Breast implant status: Secondary | ICD-10-CM

## 2018-05-28 DIAGNOSIS — R0682 Tachypnea, not elsewhere classified: Secondary | ICD-10-CM | POA: Diagnosis not present

## 2018-05-28 DIAGNOSIS — Z888 Allergy status to other drugs, medicaments and biological substances status: Secondary | ICD-10-CM

## 2018-05-28 DIAGNOSIS — Z955 Presence of coronary angioplasty implant and graft: Secondary | ICD-10-CM

## 2018-05-28 LAB — CBC WITH DIFFERENTIAL/PLATELET
Abs Immature Granulocytes: 0.08 10*3/uL — ABNORMAL HIGH (ref 0.00–0.07)
Basophils Absolute: 0 10*3/uL (ref 0.0–0.1)
Basophils Relative: 0 %
Eosinophils Absolute: 0 10*3/uL (ref 0.0–0.5)
Eosinophils Relative: 0 %
HCT: 43.1 % (ref 36.0–46.0)
Hemoglobin: 13.2 g/dL (ref 12.0–15.0)
Immature Granulocytes: 1 %
Lymphocytes Relative: 13 %
Lymphs Abs: 1.8 10*3/uL (ref 0.7–4.0)
MCH: 28 pg (ref 26.0–34.0)
MCHC: 30.6 g/dL (ref 30.0–36.0)
MCV: 91.5 fL (ref 80.0–100.0)
Monocytes Absolute: 1.2 10*3/uL — ABNORMAL HIGH (ref 0.1–1.0)
Monocytes Relative: 8 %
Neutro Abs: 11 10*3/uL — ABNORMAL HIGH (ref 1.7–7.7)
Neutrophils Relative %: 78 %
Platelets: 205 10*3/uL (ref 150–400)
RBC: 4.71 MIL/uL (ref 3.87–5.11)
RDW: 12.5 % (ref 11.5–15.5)
WBC: 14.1 10*3/uL — AB (ref 4.0–10.5)
nRBC: 0 % (ref 0.0–0.2)

## 2018-05-28 LAB — COMPREHENSIVE METABOLIC PANEL
ALK PHOS: 62 U/L (ref 38–126)
ALT: 10 U/L (ref 0–44)
ANION GAP: 9 (ref 5–15)
AST: 14 U/L — ABNORMAL LOW (ref 15–41)
Albumin: 3.3 g/dL — ABNORMAL LOW (ref 3.5–5.0)
BUN: 15 mg/dL (ref 8–23)
CO2: 37 mmol/L — ABNORMAL HIGH (ref 22–32)
Calcium: 9 mg/dL (ref 8.9–10.3)
Chloride: 96 mmol/L — ABNORMAL LOW (ref 98–111)
Creatinine, Ser: 0.89 mg/dL (ref 0.44–1.00)
GFR calc Af Amer: >60 mL/min (ref >60)
GFR calc non Af Amer: >60 mL/min (ref >60)
Glucose, Bld: 86 mg/dL (ref 70–99)
Potassium: 3.8 mmol/L (ref 3.5–5.1)
Sodium: 142 mmol/L (ref 135–145)
Total Bilirubin: 1.2 mg/dL (ref 0.3–1.2)
Total Protein: 5.9 g/dL — ABNORMAL LOW (ref 6.5–8.1)

## 2018-05-28 LAB — BLOOD GAS, VENOUS
Acid-Base Excess: 9.8 mmol/L — ABNORMAL HIGH (ref 0.0–2.0)
Bicarbonate: 39.3 mmol/L — ABNORMAL HIGH (ref 20.0–28.0)
O2 Saturation: 21.8 %
Patient temperature: 98.6
pCO2, Ven: 73.7 mmHg (ref 44.0–60.0)
pH, Ven: 7.346 (ref 7.250–7.430)

## 2018-05-28 LAB — INFLUENZA PANEL BY PCR (TYPE A & B)
Influenza A By PCR: NEGATIVE
Influenza B By PCR: NEGATIVE

## 2018-05-28 LAB — PROTIME-INR
INR: 0.87
Prothrombin Time: 11.8 seconds (ref 11.4–15.2)

## 2018-05-28 LAB — PROCALCITONIN

## 2018-05-28 LAB — I-STAT CG4 LACTIC ACID, ED
Lactic Acid, Venous: 1.21 mmol/L (ref 0.5–1.9)
Lactic Acid, Venous: 1.2 mmol/L (ref 0.5–1.9)

## 2018-05-28 LAB — D-DIMER, QUANTITATIVE (NOT AT ARMC): D DIMER QUANT: 1.34 ug{FEU}/mL — AB (ref 0.00–0.50)

## 2018-05-28 MED ORDER — CLOPIDOGREL BISULFATE 75 MG PO TABS
75.0000 mg | ORAL_TABLET | Freq: Every day | ORAL | Status: DC
  Administered 2018-05-28 – 2018-05-30 (×3): 75 mg via ORAL
  Filled 2018-05-28 (×3): qty 1

## 2018-05-28 MED ORDER — METOPROLOL TARTRATE 25 MG PO TABS
25.0000 mg | ORAL_TABLET | Freq: Two times a day (BID) | ORAL | Status: DC
  Administered 2018-05-28 – 2018-05-30 (×5): 25 mg via ORAL
  Filled 2018-05-28 (×5): qty 1

## 2018-05-28 MED ORDER — ISOSORBIDE MONONITRATE ER 30 MG PO TB24
15.0000 mg | ORAL_TABLET | Freq: Every day | ORAL | Status: DC
  Administered 2018-05-28 – 2018-05-30 (×4): 15 mg via ORAL
  Filled 2018-05-28 (×4): qty 1

## 2018-05-28 MED ORDER — ALBUTEROL SULFATE (2.5 MG/3ML) 0.083% IN NEBU
5.0000 mg | INHALATION_SOLUTION | Freq: Once | RESPIRATORY_TRACT | Status: AC
  Administered 2018-05-28: 5 mg via RESPIRATORY_TRACT

## 2018-05-28 MED ORDER — ONDANSETRON HCL 4 MG PO TABS
4.0000 mg | ORAL_TABLET | Freq: Three times a day (TID) | ORAL | Status: DC | PRN
  Administered 2018-05-28 – 2018-05-29 (×2): 4 mg via ORAL
  Filled 2018-05-28 (×2): qty 1

## 2018-05-28 MED ORDER — TRAMADOL HCL 50 MG PO TABS
100.0000 mg | ORAL_TABLET | Freq: Two times a day (BID) | ORAL | Status: DC
  Administered 2018-05-28 – 2018-05-30 (×4): 100 mg via ORAL
  Filled 2018-05-28 (×4): qty 2

## 2018-05-28 MED ORDER — ENOXAPARIN SODIUM 40 MG/0.4ML ~~LOC~~ SOLN
40.0000 mg | SUBCUTANEOUS | Status: DC
  Administered 2018-05-28: 40 mg via SUBCUTANEOUS
  Filled 2018-05-28: qty 0.4

## 2018-05-28 MED ORDER — PREDNISONE 20 MG PO TABS: 40.0000 mg | ORAL_TABLET | Freq: Every day | ORAL | Status: DC

## 2018-05-28 MED ORDER — HYDROXYZINE HCL 25 MG PO TABS
25.0000 mg | ORAL_TABLET | Freq: Three times a day (TID) | ORAL | Status: DC
  Administered 2018-05-28 – 2018-05-30 (×5): 25 mg via ORAL
  Filled 2018-05-28 (×6): qty 1

## 2018-05-28 MED ORDER — SODIUM CHLORIDE 0.9 % IV SOLN
1.0000 g | Freq: Once | INTRAVENOUS | Status: AC
  Administered 2018-05-28: 1 g via INTRAVENOUS
  Filled 2018-05-28: qty 10

## 2018-05-28 MED ORDER — PRAVASTATIN SODIUM 20 MG PO TABS
20.0000 mg | ORAL_TABLET | Freq: Every day | ORAL | Status: DC
  Administered 2018-05-28 – 2018-05-29 (×2): 20 mg via ORAL
  Filled 2018-05-28 (×3): qty 1

## 2018-05-28 MED ORDER — TRAZODONE HCL 50 MG PO TABS
50.0000 mg | ORAL_TABLET | Freq: Every day | ORAL | Status: DC
  Administered 2018-05-28 – 2018-05-29 (×2): 50 mg via ORAL
  Filled 2018-05-28 (×2): qty 1

## 2018-05-28 MED ORDER — SODIUM CHLORIDE 0.9 % IV SOLN
1.0000 g | INTRAVENOUS | Status: DC
  Administered 2018-05-29: 1 g via INTRAVENOUS
  Filled 2018-05-28 (×2): qty 10

## 2018-05-28 MED ORDER — DICLOFENAC SODIUM 1 % TD GEL
4.0000 g | Freq: Four times a day (QID) | TRANSDERMAL | Status: DC
  Filled 2018-05-28: qty 100

## 2018-05-28 MED ORDER — TIOTROPIUM BROMIDE MONOHYDRATE 18 MCG IN CAPS
18.0000 ug | ORAL_CAPSULE | Freq: Every day | RESPIRATORY_TRACT | Status: DC
  Administered 2018-05-29: 18 ug via RESPIRATORY_TRACT
  Filled 2018-05-28: qty 5

## 2018-05-28 MED ORDER — ALBUTEROL SULFATE (2.5 MG/3ML) 0.083% IN NEBU: 2.5000 mg | INHALATION_SOLUTION | RESPIRATORY_TRACT | Status: DC | PRN

## 2018-05-28 MED ORDER — POLYETHYLENE GLYCOL 3350 17 G PO PACK: 17.0000 g | PACK | Freq: Every day | ORAL | Status: DC | PRN

## 2018-05-28 MED ORDER — ALBUTEROL SULFATE (2.5 MG/3ML) 0.083% IN NEBU
5.0000 mg | INHALATION_SOLUTION | Freq: Once | RESPIRATORY_TRACT | Status: DC
  Filled 2018-05-28: qty 6

## 2018-05-28 MED ORDER — DOCUSATE SODIUM 100 MG PO CAPS: 100.0000 mg | ORAL_CAPSULE | Freq: Every day | ORAL | Status: DC | PRN

## 2018-05-28 MED ORDER — IPRATROPIUM-ALBUTEROL 0.5-2.5 (3) MG/3ML IN SOLN
3.0000 mL | Freq: Four times a day (QID) | RESPIRATORY_TRACT | Status: DC
  Administered 2018-05-28: 3 mL via RESPIRATORY_TRACT
  Filled 2018-05-28: qty 3

## 2018-05-28 MED ORDER — ACETAMINOPHEN 500 MG PO TABS: 1000.0000 mg | ORAL_TABLET | Freq: Four times a day (QID) | ORAL | Status: DC | PRN

## 2018-05-28 MED ORDER — SODIUM CHLORIDE 0.9 % IV SOLN
INTRAVENOUS | Status: AC
  Administered 2018-05-28: 17:00:00 via INTRAVENOUS

## 2018-05-28 MED ORDER — METHYLPREDNISOLONE SODIUM SUCC 125 MG IJ SOLR
125.0000 mg | Freq: Once | INTRAMUSCULAR | Status: AC
  Administered 2018-05-28: 125 mg via INTRAVENOUS
  Filled 2018-05-28: qty 2

## 2018-05-28 MED ORDER — METHYLPREDNISOLONE SODIUM SUCC 40 MG IJ SOLR
40.0000 mg | Freq: Four times a day (QID) | INTRAMUSCULAR | Status: DC
  Administered 2018-05-28 – 2018-05-29 (×3): 40 mg via INTRAVENOUS
  Filled 2018-05-28 (×3): qty 1

## 2018-05-28 MED ORDER — MORPHINE SULFATE 10 MG/5ML PO SOLN: 2.5000 mg | ORAL | Status: DC | PRN

## 2018-05-28 MED ORDER — POLYVINYL ALCOHOL 1.4 % OP SOLN
1.0000 [drp] | Freq: Every day | OPHTHALMIC | Status: DC | PRN
  Filled 2018-05-28: qty 15

## 2018-05-28 MED ORDER — ALBUTEROL SULFATE (2.5 MG/3ML) 0.083% IN NEBU
2.5000 mg | INHALATION_SOLUTION | Freq: Three times a day (TID) | RESPIRATORY_TRACT | Status: DC
  Administered 2018-05-29: 2.5 mg via RESPIRATORY_TRACT
  Filled 2018-05-28 (×3): qty 3

## 2018-05-28 MED ORDER — ACETAMINOPHEN 325 MG PO TABS
650.0000 mg | ORAL_TABLET | Freq: Once | ORAL | Status: AC
  Administered 2018-05-28: 650 mg via ORAL
  Filled 2018-05-28: qty 2

## 2018-05-28 NOTE — ED Triage Notes (Signed)
EMS states pt is from home, she has COPD and in having increased congestion and unable to cough sputum up. Feels pressure as she is not able to get secretions out. 148/77-96% 2L 99-12 LD looks ok, pt is on home 02 at 2 liters.

## 2018-05-28 NOTE — ED Provider Notes (Signed)
[facility] DEPT Provider Note   CSN: [id number] Arrival date & time: 05/28/18  1032     History   Chief Complaint Chief Complaint  Patient presents with  . Shortness of Breath    HPI IZOLA TEAGUE is a 78 y.o. female.  The history is provided by the patient, the spouse and medical records. No language interpreter was used.  Shortness of Breath    ARTRICE KRAKER is a 78 y.o. female who presents to the Emergency Department complaining of sob. Level V caveat due to confusion.  Hx is provided by husband and patient.  She had sob that started last night.  Today sob significantly worsened with associated fever, rattling in chest.  She is on home oxygen 2L [place] as well as prednisone 15mg  daily - did not take today.  She is coming from home, minimally ambulatory at baseline and requires significant assistance from her husband.  She was around ill family members on Christmas and her husband recently became ill with coughing and sneezing.  She did receive a flu vaccine.   Past Medical History:  Diagnosis Date  . ANXIETY 01/01/2007  . Blood transfusion without reported diagnosis 2015  . BURSITIS, RIGHT HIP 06/04/2009  . CAD (coronary artery disease)    a. BMS to LAD 2010. b. NSTEMI with DES to LAD for ISR 2011. c. Patent stent 03/2012/Imdur added.  . Cataract   . Colon polyps    H/o tubular adenoma of colon  . COPD 01/01/2007   a. Chronic resp failure on home O2.  Marland Kitchen DEPRESSION 01/01/2007  . Eczema 01/08/2011  . GROIN PAIN 06/20/2008  . Headache(784.0) 01/01/2007  . Hemorrhoid   . HYPERTENSION 01/01/2007  . Impaired glucose tolerance 01/07/2011  . LOW BACK PAIN 01/01/2007  . Muscle weakness (generalized) 06/04/2009  . On home oxygen therapy    "2L; 24/7" (03/21/2018)  . OSTEOARTHRITIS, HIP 07/01/2008  . OSTEOPOROSIS 01/01/2007  . Pneumonia 1998  . Rosacea 01/08/2011  . SYNCOPE 01/01/2007  . Thoracic compression fracture ([place])   . TRANSIENT ISCHEMIC ATTACK,  HX OF 01/01/2007    Patient Active Problem List   Diagnosis Date Noted  . COPD exacerbation ([place]) 05/28/2018  . Lymphedema 03/21/2018  . Left upper quadrant pain 12/21/2017  . 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 Insect bite, sequela 09/21/2017  . Hypokalemia 08/15/2017  . On home O2 07/27/2017  . Itching 07/08/2017  . Skin lesion 07/08/2017  . Ingrown nail 07/08/2017  . Back pain 10/06/2016  . Fracture of seventh thoracic vertebra ([place]) 10/06/2016  . Non-traumatic compression fracture of T7 thoracic vertebra   . Thoracic compression fracture ([place])   . Intractable back pain 09/10/2016  . Closed compression fracture of thoracic vertebra ([place])   . 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 Peripheral edema 06/15/2016  . Smoker 08/02/2014  . Peripheral vascular disease ([place]) 08/02/2014  . Abdominal pain, epigastric 11/22/2013  . Black stools 11/22/2013  . Displaced fracture of left femoral neck ([place]) 09/30/2013  . Fracture of femoral neck, left, closed ([place]) 09/30/2013  . Fall at home 09/30/2013  . Head contusion 09/30/2013  . Leg weakness, bilateral 07/21/2012  . 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 Unstable angina pectoris ([place]) 04/12/2012  . Chronic respiratory failure ([place]) 02/13/2012  . Localized swelling, mass and lump, neck 01/06/2012  . CAD (coronary artery disease) 12/14/2011  . Dyspnea 11/22/2011  . Personal history of colonic polyps 09/06/2011  . Eczema 01/08/2011  .  Rosacea 01/08/2011  . URI (upper respiratory infection) 01/08/2011  . Dizziness - light-headed 01/08/2011  . Encounter for long-term (current) use of high-risk medication 01/08/2011  . Impaired glucose tolerance 01/07/2011  . Preventative health care 01/07/2011  . BURSITIS, RIGHT HIP 06/04/2009  . CAD, NATIVE VESSEL 01/15/2009  . Other symptoms involving cardiovascular system 01/15/2009  . CHEST PAIN-PRECORDIAL 01/15/2009  . OSTEOARTHRITIS, HIP 07/01/2008  . Hyperlipidemia  01/01/2007  . Anxiety state 01/01/2007  . 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 Essential hypertension 01/01/2007  . 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 LOW BACK PAIN 01/01/2007  . Osteoporosis 01/01/2007  . SYNCOPE 01/01/2007  . Headache(784.0) 01/01/2007  . TRANSIENT ISCHEMIC ATTACK, HX OF 01/01/2007    Past Surgical History:  Procedure Laterality Date  . ABDOMINAL HYSTERECTOMY    . APPENDECTOMY    . BACK SURGERY    . BREAST ENHANCEMENT SURGERY    . COLONOSCOPY  01/25/2002   tubular adenoma,hemorrhoids, hyperplastic  colon polyps  . COLONOSCOPY  02/17/2005   hemorrhoids  . CORONARY ANGIOPLASTY    . CORONARY STENT PLACEMENT    . ESOPHAGOGASTRODUODENOSCOPY (EGD) WITH PROPOFOL N/A 08/10/2017   Procedure: ESOPHAGOGASTRODUODENOSCOPY (EGD) WITH PROPOFOL;  Surgeon: Gatha Mayer, MD;  Location: [facility];  Service: Endoscopy;  Laterality: N/A;  . HIP ARTHROPLASTY Left 10/01/2013   Procedure: ARTHROPLASTY UNIPOLAR   HIP;  Surgeon: Marianna Payment, MD;  Location: [facility];  Service: Orthopedics;  Laterality: Left;  . HIP SURGERY Left    DR XU     PROXIMAL NECK   . IR GENERIC HISTORICAL  02/19/2016   IR VERTEBROPLASTY CERV/THOR BX INC UNI/BIL INC/INJECT/IMAGING 02/19/2016 Luanne Bras, MD [facility]  . IR GENERIC HISTORICAL  07/15/2016   IR KYPHO THORACIC WITH BONE BIOPSY 07/15/2016 Luanne Bras, MD [facility]  . IR KYPHO EA ADDL LEVEL THORACIC OR LUMBAR  09/14/2016  . IR RADIOLOGIST EVAL & MGMT  09/22/2016  . IR VERTEBROPLASTY CERV/THOR BX INC UNI/BIL INC/INJECT/IMAGING  10/08/2016  . IR VERTEBROPLASTY CERV/THOR BX INC UNI/BIL INC/INJECT/IMAGING  11/16/2016  . KYPHOPLASTY  12/2015-10/2016 X 5   "?2lumbar & 3 thoracic"  . LEFT HEART CATHETERIZATION WITH CORONARY ANGIOGRAM N/A 04/13/2012   Procedure: LEFT HEART CATHETERIZATION WITH CORONARY ANGIOGRAM;  Surgeon: Burnell Blanks, MD;  Location: [facility] CATH LAB;  Service:  Cardiovascular;  Laterality: N/A;  . OOPHORECTOMY     one ovary  . s/p bilat cataract  2010  . sp lumbar disc surgury     [name]     OB History   No obstetric history on file.      Home Medications    Prior to Admission medications   Medication Sig Start Date End Date Taking? Authorizing Provider  acetaminophen (TYLENOL) 500 MG tablet Take 1,000 mg by mouth every 6 (six) hours as needed for moderate pain or headache.    Yes [provider]  Cholecalciferol (VITAMIN D) 1000 UNITS capsule Take 1,000 Units by mouth every evening.    Yes [provider]  clopidogrel (PLAVIX) 75 MG tablet TAKE 1 TABLET BY MOUTH EVERY DAY Patient taking differently: Take 75 mg by mouth daily.  01/12/18  Yes Burnell Blanks, MD  docusate sodium (COLACE) 100 MG capsule Take 100 mg by mouth daily as needed for mild constipation.    Yes [provider]  furosemide (LASIX) 20 MG tablet Take 1 tablet (20 mg total) by mouth 2 (two) times daily as needed. 03/11/17  Yes Biagio Borg, MD  hydrOXYzine (ATARAX/VISTARIL) 25 MG tablet TAKE 1 OR 2 TABLETS BY MOUTH EVERY 8 HOURS AS NEEDED FOR ANXIETY Patient taking differently: Take 25 mg by mouth 3 (three) times daily. Taking one tablet daily 02/28/18  Yes Biagio Borg, MD  isosorbide mononitrate (IMDUR) 30 MG 24 hr tablet TAKE 0.5 TABLETS (15 MG TOTAL) BY MOUTH DAILY. Patient taking differently: Take 15 mg by mouth daily. TAKE 0.5 TABLETS (15 MG TOTAL) BY MOUTH DAILY. 04/11/18  Yes Burnell Blanks, MD  lovastatin (MEVACOR) 20 MG tablet TAKE 20 MG BY MOUTH AT BEDTIME Patient taking differently: Take 20 mg by mouth at bedtime.  02/15/18  Yes Biagio Borg, MD  metoprolol tartrate (LOPRESSOR) 25 MG tablet TAKE 1 TABLET BY MOUTH TWICE A DAY NEEDS APPT Patient taking differently: Take 25 mg by mouth 2 (two) times daily.  03/16/18  Yes Burnell Blanks, MD  nitroGLYCERIN (NITROSTAT) 0.4 MG SL tablet Place 1 tablet (0.4  mg total) under the tongue every 5 (five) minutes as needed for chest pain (up to 3 doses). 04/13/12  Yes Dunn, Dayna N, PA-C  ondansetron (ZOFRAN) 4 MG tablet Take 1 tablet (4 mg total) by mouth every 8 (eight) hours as needed for nausea or vomiting. 01/06/16  Yes Mackuen, Courteney Lyn, MD  OXYGEN Inhale 2 L into the lungs continuous.    Yes [provider]  polyethylene glycol (MIRALAX / GLYCOLAX) packet Take 17 g by mouth daily as needed for mild constipation.   Yes [provider]  polyvinyl alcohol (LIQUIFILM TEARS) 1.4 % ophthalmic solution Place 1 drop into both eyes daily as needed for dry eyes.    Yes [provider]  potassium chloride (K-DUR) 10 MEQ tablet Take 1 tablet (10 mEq total) by mouth daily. 08/15/17  Yes Biagio Borg, MD  predniSONE (DELTASONE) 10 MG tablet TAKE 1 1/2 TABLET BY MOUTH DAILY WITH BREAKFAST Patient taking differently: Take 15 mg by mouth daily with breakfast.  06/07/17  Yes Byrum, Rose Fillers, MD  PROAIR HFA 108 [phone] Base) MCG/ACT inhaler INHALE 2 PUFFS INTO THE LUNGS EVERY 6 (SIX) HOURS AS NEEDED FOR WHEEZING OR SHORTNESS OF BREATH. 05/09/18  Yes Biagio Borg, MD  tiotropium (SPIRIVA HANDIHALER) 18 MCG inhalation capsule Place 1 capsule (18 mcg total) into inhaler and inhale daily at 6 (six) AM. 04/11/18  Yes Biagio Borg, MD  traMADol (ULTRAM) 50 MG tablet Take 100 mg by mouth 2 (two) times daily.    Yes [provider]  traZODone (DESYREL) 50 MG tablet Take 50 mg by mouth at bedtime.   Yes [provider]  vitamin B-12 (CYANOCOBALAMIN) 1000 MCG tablet Take 1,000 mcg by mouth daily.   Yes [provider]  vitamin C (ASCORBIC ACID) 500 MG tablet Take 500 mg by mouth 2 (two) times daily.   Yes [provider]  diclofenac sodium (VOLTAREN) 1 % GEL APPLY 4 GRAMS TOPICALLY 4 TIMES DAILY Patient taking differently: Apply 4 g topically 4 (four) times daily.  04/04/18   Biagio Borg, MD  predniSONE (DELTASONE)  20 MG tablet Take 1 tablet (20 mg total) by mouth daily with breakfast. 05/12/18   Collene Gobble, MD    Family History Family History  Problem Relation Age of Onset  . Osteoporosis Mother   . Heart disease Sister   . Emphysema Sister   . Seizures Sister        epilepsy  . Cardiomyopathy  Sister   . Heart attack Sister   . Colon cancer Neg Hx   . Colon polyps Neg Hx   . Esophageal cancer Neg Hx   . Rectal cancer Neg Hx   . Stomach cancer Neg Hx     Social History Social History   Tobacco Use  . Smoking status: Former Smoker    Packs/day: 1.50    Years: 51.00    Pack years: 76.50    Types: Cigarettes    Last attempt to quit: 07/24/2005    Years since quitting: 12.8  . Smokeless tobacco: Never Used  Substance Use Topics  . Alcohol use: No    Alcohol/week: 0.0 standard drinks  . Drug use: No     Allergies   Aspirin; Codeine; and Other   Review of Systems Review of Systems  Respiratory: Positive for shortness of breath.   All other systems reviewed and are negative.    Physical Exam Updated Vital Signs BP (!) 131/92 (BP Location: Right Arm)   Pulse 99   Temp 99.8 F (37.7 C) (Oral)   Resp 20   Ht 5\' 7"  (1.702 m)   Wt 55.3 kg   SpO2 99%   BMI 19.11 kg/m   Physical Exam Vitals signs and nursing note reviewed.  Constitutional:      Appearance: She is well-developed.     Comments: Chronically ill appearing  HENT:     Head: Normocephalic and atraumatic.  Cardiovascular:     Rate and Rhythm: Normal rate and regular rhythm.     Heart sounds: No murmur.  Pulmonary:     Effort: No respiratory distress.     Comments: Tachypnea, decreased air movement bilaterally Abdominal:     Palpations: Abdomen is soft.     Tenderness: There is no abdominal tenderness. There is no guarding or rebound.  Musculoskeletal:        General: No tenderness.     Comments: Erythema and pitting edema to LLE (husband states this is chronic)  Skin:    General: Skin is warm and  dry.  Neurological:     Mental Status: She is alert.     Comments: Alert, mildly confused.  Oriented to hospital, disoriented to time.  Generalized weakness.    Psychiatric:        Behavior: Behavior normal.      ED Treatments / Results  Labs (all labs ordered are listed, but only abnormal results are displayed) Labs Reviewed  COMPREHENSIVE METABOLIC PANEL - Abnormal; Notable for the following components:      Result Value   Chloride 96 (*)    CO2 37 (*)    Total Protein 5.9 (*)    Albumin 3.3 (*)    AST 14 (*)    All other components within normal limits  CBC WITH DIFFERENTIAL/PLATELET - Abnormal; Notable for the following components:   WBC 14.1 (*)    Neutro Abs 11.0 (*)    Monocytes Absolute 1.2 (*)    Abs Immature Granulocytes 0.08 (*)    All other components within normal limits  BLOOD GAS, VENOUS - Abnormal; Notable for the following components:   pCO2, Ven 73.7 (*)    Bicarbonate 39.3 (*)    Acid-Base Excess 9.8 (*)    All other components within normal limits  D-DIMER, QUANTITATIVE (NOT AT [facility]) - Abnormal; Notable for the following components:   D-Dimer, Quant 1.34 (*)    All other components within normal limits  CULTURE, BLOOD (ROUTINE X 2)  CULTURE, BLOOD (ROUTINE X 2)  PROTIME-INR  INFLUENZA PANEL BY PCR (TYPE A & B)  PROCALCITONIN  I-STAT CG4 LACTIC ACID, ED  I-STAT CG4 LACTIC ACID, ED    EKG EKG Interpretation  Date/Time:  Sunday May 28 2018 10:50:28 EST Ventricular Rate:  101 PR Interval:    QRS Duration: 82 QT Interval:  347 QTC Calculation: 450 R Axis:   55 Text Interpretation:  Sinus tachycardia Ventricular premature complex Anteroseptal infarct, old Nonspecific repol abnormality, diffuse leads Confirmed by Quintella Reichert [phone]) on 05/28/2018 10:54:53 AM   Radiology Dg Chest 2 View  Result Date: 05/28/2018 CLINICAL DATA:  Shortness of breath. EXAM: CHEST - 2 VIEW COMPARISON:  03/22/2018 FINDINGS: Normal heart size. No pleural effusion  or edema identified. No airspace opacities identified. Bilateral breast implants with capsular calcification noted. The bones are diffusely osteopenic. Multiple thoracic vertebral compression deformities are identified which have been treated with bone cement. IMPRESSION: No active cardiopulmonary disease. Electronically Signed   By: Kerby Moors M.D.   On: 05/28/2018 11:43    Procedures Procedures (including critical care time)  Medications Ordered in ED Medications  diclofenac sodium (VOLTAREN) 1 % transdermal gel 4 g (has no administration in time range)  acetaminophen (TYLENOL) tablet 1,000 mg (has no administration in time range)  traMADol (ULTRAM) tablet 100 mg (has no administration in time range)  isosorbide mononitrate (IMDUR) 24 hr tablet 15 mg (has no administration in time range)  pravastatin (PRAVACHOL) tablet 20 mg (has no administration in time range)  metoprolol tartrate (LOPRESSOR) tablet 25 mg (has no administration in time range)  hydrOXYzine (ATARAX/VISTARIL) tablet 25 mg (has no administration in time range)  traZODone (DESYREL) tablet 50 mg (has no administration in time range)  docusate sodium (COLACE) capsule 100 mg (has no administration in time range)  ondansetron (ZOFRAN) tablet 4 mg (has no administration in time range)  polyethylene glycol (MIRALAX / GLYCOLAX) packet 17 g (has no administration in time range)  clopidogrel (PLAVIX) tablet 75 mg (has no administration in time range)  tiotropium (SPIRIVA) inhalation capsule ([facility] use ONLY) 18 mcg (18 mcg Inhalation Not Given 05/28/18 1502)  polyvinyl alcohol (LIQUIFILM TEARS) 1.4 % ophthalmic solution 1 drop (has no administration in time range)  enoxaparin (LOVENOX) injection 40 mg (has no administration in time range)  0.9 %  sodium chloride infusion (has no administration in time range)  cefTRIAXone (ROCEPHIN) 1 g in sodium chloride 0.9 % 100 mL IVPB (has no administration in time range)  methylPREDNISolone  sodium succinate (SOLU-MEDROL) 40 mg/mL injection 40 mg (has no administration in time range)    Followed by  predniSONE (DELTASONE) tablet 40 mg (has no administration in time range)  ipratropium-albuterol (DUONEB) 0.5-2.5 (3) MG/3ML nebulizer solution 3 mL (3 mLs Nebulization Not Given 05/28/18 1450)  albuterol (PROVENTIL) (2.5 MG/3ML) 0.083% nebulizer solution 2.5 mg (has no administration in time range)  morphine 10 MG/5ML solution 2.5 mg (has no administration in time range)  methylPREDNISolone sodium succinate (SOLU-MEDROL) 125 mg/2 mL injection 125 mg (125 mg Intravenous Given 05/28/18 1249)  albuterol (PROVENTIL) (2.5 MG/3ML) 0.083% nebulizer solution 5 mg (5 mg Nebulization Given 05/28/18 1241)  cefTRIAXone (ROCEPHIN) 1 g in sodium chloride 0.9 % 100 mL IVPB (0 g Intravenous Stopped 05/28/18 1320)  acetaminophen (TYLENOL) tablet 650 mg (650 mg Oral Given 05/28/18 1240)     Initial Impression / Assessment and Plan / ED Course  I have reviewed the triage vital signs and the nursing notes.  Pertinent labs &  imaging results that were available during my care of the patient were reviewed by me and considered in my medical decision making (see chart for details).     Patient with history of COPD here for evaluation of cough and rattling in her chest since yesterday. She is chronically ill appearing. Chest x-ray without infiltrate but in setting of pneumonia will treat with antibiotics. She was treated for possible COPD exacerbation with steroids, albuterol. Plan to admit for further evaluation and treatment. [facility] consulted for admission.  Final Clinical Impressions(s) / ED Diagnoses   Final diagnoses:  Cough    ED Discharge Orders    None       Quintella Reichert, MD 05/28/18 1606

## 2018-05-28 NOTE — ED Notes (Signed)
ED TO INPATIENT HANDOFF REPORT  Name/Age/Gender Alexis Higgins 78 y.o. female  Code Status Code Status History    Date Active Date Inactive Code Status Order ID Comments User Context   03/21/2018 1334 03/22/2018 1621 Full Code [id number]  Karmen Bongo, MD ED   12/24/2016 1947 12/26/2016 1822 Full Code [id number]  Vianne Bulls, MD ED   11/13/2016 1156 11/16/2016 1925 Full Code [id number]  Radene Gunning, NP ED   10/06/2016 0518 10/09/2016 2007 Full Code [id number]  Ivor Costa, MD ED   09/10/2016 1524 09/15/2016 1630 Partial Code [id number]  Brenton Grills, PA-C ED   10/01/2013 1856 10/04/2013 1836 Full Code [id number]  Marianna Payment, MD Inpatient   09/30/2013 2102 10/01/2013 1856 Full Code [id number]  Phillips Grout, MD Inpatient   04/12/2012 1950 04/13/2012 2000 Full Code [id number]  Hardie Pulley, RN ED      Home/SNF/Other Home  Chief Complaint respiratory problems  Level of Care/Admitting Diagnosis ED Disposition    ED Disposition Condition [facility] Area: [facility] [[id number]]  Level of Care: Med-Surg [16]  Diagnosis: COPD exacerbation [facility]) [[id number]]  Admitting Physician: Edwin Dada [[id number]]  Attending Physician: Edwin Dada [[id number]]  Estimated length of stay: past midnight tomorrow  Certification:: I certify this patient will need inpatient services for at least 2 midnights  PT Class (Do Not Modify): Inpatient [101]  PT Acc Code (Do Not Modify): Private [1]       Medical History Past Medical History:  Diagnosis Date  . ANXIETY 01/01/2007  . Blood transfusion without reported diagnosis 2015  . BURSITIS, RIGHT HIP 06/04/2009  . CAD (coronary artery disease)    a. BMS to LAD 2010. b. NSTEMI with DES to LAD for ISR 2011. c. Patent stent 03/2012/Imdur added.  . Cataract   . Colon polyps    H/o tubular adenoma of colon  . COPD 01/01/2007   a. Chronic resp failure on home O2.  Marland Kitchen DEPRESSION 01/01/2007  .  Eczema 01/08/2011  . GROIN PAIN 06/20/2008  . Headache(784.0) 01/01/2007  . Hemorrhoid   . HYPERTENSION 01/01/2007  . Impaired glucose tolerance 01/07/2011  . LOW BACK PAIN 01/01/2007  . Muscle weakness (generalized) 06/04/2009  . On home oxygen therapy    "2L; 24/7" (03/21/2018)  . OSTEOARTHRITIS, HIP 07/01/2008  . OSTEOPOROSIS 01/01/2007  . Pneumonia 1998  . Rosacea 01/08/2011  . SYNCOPE 01/01/2007  . Thoracic compression fracture ([place])   . TRANSIENT ISCHEMIC ATTACK, HX OF 01/01/2007    Allergies Allergies  Allergen Reactions  . Aspirin Other (See Comments)     cns bleed risk  . Codeine Anaphylaxis and Rash  . Other Other (See Comments)    Stiolto - severe reaction - caused inability to breath    IV Location/Drains/Wounds Patient Lines/Drains/Airways Status   Active Line/Drains/Airways    Name:   Placement date:   Placement time:   Site:   Days:   Peripheral IV 05/28/18 Antecubital   05/28/18    1118    Antecubital   less than 1   External Urinary Catheter   05/28/18    1057    -   less than 1          Labs/Imaging Results for orders placed or performed during the hospital encounter of 05/28/18 (from the past 48 hour(s))  Comprehensive metabolic panel     Status: Abnormal   Collection Time: 05/28/18 11:18 AM  Result Value  Ref Range   Sodium 142 135 - 145 mmol/L   Potassium 3.8 3.5 - 5.1 mmol/L   Chloride 96 (L) 98 - 111 mmol/L   CO2 37 (H) 22 - 32 mmol/L   Glucose, Bld 86 70 - 99 mg/dL   BUN 15 8 - 23 mg/dL   Creatinine, Ser 0.89 0.44 - 1.00 mg/dL   Calcium 9.0 8.9 - 10.3 mg/dL   Total Protein 5.9 (L) 6.5 - 8.1 g/dL   Albumin 3.3 (L) 3.5 - 5.0 g/dL   AST 14 (L) 15 - 41 U/L   ALT 10 0 - 44 U/L   Alkaline Phosphatase 62 38 - 126 U/L   Total Bilirubin 1.2 0.3 - 1.2 mg/dL   GFR calc non Af Amer >60 >60 mL/min   GFR calc Af Amer >60 >60 mL/min   Anion gap 9 5 - 15    Comment: Performed at [facility], [place] [address] [place], [place] [address]  CBC  with Differential     Status: Abnormal   Collection Time: 05/28/18 11:18 AM  Result Value Ref Range   WBC 14.1 (H) 4.0 - 10.5 K/uL   RBC 4.71 3.87 - 5.11 MIL/uL   Hemoglobin 13.2 12.0 - 15.0 g/dL   HCT 43.1 36.0 - 46.0 %   MCV 91.5 80.0 - 100.0 fL   MCH 28.0 26.0 - 34.0 pg   MCHC 30.6 30.0 - 36.0 g/dL   RDW 12.5 11.5 - 15.5 %   Platelets 205 150 - 400 K/uL   nRBC 0.0 0.0 - 0.2 %   Neutrophils Relative % 78 %   Neutro Abs 11.0 (H) 1.7 - 7.7 K/uL   Lymphocytes Relative 13 %   Lymphs Abs 1.8 0.7 - 4.0 K/uL   Monocytes Relative 8 %   Monocytes Absolute 1.2 (H) 0.1 - 1.0 K/uL   Eosinophils Relative 0 %   Eosinophils Absolute 0.0 0.0 - 0.5 K/uL   Basophils Relative 0 %   Basophils Absolute 0.0 0.0 - 0.1 K/uL   Immature Granulocytes 1 %   Abs Immature Granulocytes 0.08 (H) 0.00 - 0.07 K/uL    Comment: Performed at [facility], [place] [address], [place], [place] [address]  Protime-INR     Status: None   Collection Time: 05/28/18 11:18 AM  Result Value Ref Range   Prothrombin Time 11.8 11.4 - 15.2 seconds   INR 0.87     Comment: Performed at [facility], [place] [address], [place], [place] [address]  I-Stat CG4 Lactic Acid, ED     Status: None   Collection Time: 05/28/18 11:26 AM  Result Value Ref Range   Lactic Acid, Venous 1.21 0.5 - 1.9 mmol/L  Influenza panel by PCR (type A & B)     Status: None   Collection Time: 05/28/18 12:49 PM  Result Value Ref Range   Influenza A By PCR NEGATIVE NEGATIVE   Influenza B By PCR NEGATIVE NEGATIVE    Comment: (NOTE) The Xpert Xpress Flu assay is intended as an aid in the diagnosis of  influenza and should not be used as a sole basis for treatment.  This  assay is FDA approved for nasopharyngeal swab specimens only. Nasal  washings and aspirates are unacceptable for Xpert Xpress Flu testing. Performed at [facility], [place] [address], [place], [place] [address]   Blood gas, venous      Status: Abnormal   Collection Time: 05/28/18 12:49 PM  Result Value Ref Range   pH, Ven 7.346 7.250 - 7.430   pCO2, Ven 73.7 (HH) 44.0 - 60.0 mmHg    Comment: CRITICAL RESULT CALLED TO, READ BACK BY AND VERIFIED WITH: C.DANFORD, MD AT 1315 BY M.JESTER, RRT, RCP ON 05/28/2018    pO2, Ven BELOW REPORTABLE RANGE 32.0 - 45.0 mmHg    Comment: CRITICAL RESULT CALLED TO, READ BACK BY AND VERIFIED WITH: C.DANFORD, MD AT 1315 BY M.JESTER, RRT, RCP ON 05/28/2018    Bicarbonate 39.3 (H) 20.0 - 28.0 mmol/L   Acid-Base Excess 9.8 (H) 0.0 - 2.0 mmol/L   O2 Saturation 21.8 %   Patient temperature 98.6    Collection site VENOUS    Drawn by DRAWN BY RN    Sample type VENOUS     Comment: Performed at [place] [address] [place], [place] [address]  I-Stat CG4 Lactic Acid, ED     Status: None   Collection Time: 05/28/18  1:55 PM  Result Value Ref Range   Lactic Acid, Venous 1.20 0.5 - 1.9 mmol/L   Dg Chest 2 View  Result Date: 05/28/2018 CLINICAL DATA:  Shortness of breath. EXAM: CHEST - 2 VIEW COMPARISON:  03/22/2018 FINDINGS: Normal heart size. No pleural effusion or edema identified. No airspace opacities identified. Bilateral breast implants with capsular calcification noted. The bones are diffusely osteopenic. Multiple thoracic vertebral compression deformities are identified which have been treated with bone cement. IMPRESSION: No active cardiopulmonary disease. Electronically Signed   By: Kerby Moors M.D.   On: 05/28/2018 11:43   EKG Interpretation  Date/Time:  Sunday May 28 2018 10:50:28 EST Ventricular Rate:  101 PR Interval:    QRS Duration: 82 QT Interval:  347 QTC Calculation: 450 R Axis:   55 Text Interpretation:  Sinus tachycardia Ventricular premature complex Anteroseptal infarct, old Nonspecific repol abnormality, diffuse leads Confirmed by Rees, Elizabeth ([id number]) on 05/28/2018 10:54:53 AM   Pending Labs Unresulted Labs (From admission, onward)     Start     Ordered   05/28/18 1251  D-dimer, quantitative  Once,   STAT     05/28/18 1250   05/28/18 1118  Culture, blood (Routine x 2)  BLOOD CULTURE X 2,   STAT     05/28/18 1117   05/28/18 1118  Urinalysis, Routine w reflex microscopic  ONCE - STAT,   STAT     01 /05/20 1117          Vitals/Pain Today's Vitals   05/28/18 1300 05/28/18 1307 05/28/18 1317 05/28/18 1401  BP: (!) 146/84 (!) 143/80 (!) 143/80 (!) 182/83  Pulse:  (!) 101 (!) 104 (!) 102  Resp: (!) 26 (!) 21 16 15   Temp:      TempSrc:      SpO2:  100% 97% 94%  Weight:      Height:      PainSc:        Isolation Precautions Droplet precaution  Medications Medications  methylPREDNISolone sodium succinate (SOLU-MEDROL) 125 mg/2 mL injection 125 mg (125 mg Intravenous Given 05/28/18 1249)  albuterol (PROVENTIL) (2.5 MG/3ML) 0.083% nebulizer solution 5 mg (5 mg Nebulization Given 05/28/18 1241)  cefTRIAXone (ROCEPHIN) 1 g in sodium chloride 0.9 % 100 mL IVPB (0 g Intravenous Stopped 05/28/18 1320)  acetaminophen (TYLENOL) tablet 650 mg (650 mg Oral Given 05/28/18 1240)    Mobility non-ambulatory

## 2018-05-28 NOTE — H&P (Signed)
History and Physical  Patient Name: Alexis Higgins     XKG:[id number]    DOB: 1940/12/23    DOA: 05/28/2018 PCP: Biagio Borg, MD  Patient coming from: Home  Chief Complaint: Cough, chest congestion      HPI: Alexis Higgins is a 78 y.o. 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  At baseline, patient is able to participate in transfers, cannot walk more than a few steps due to old back disability.  She has severe COPD, with FEV1 30% as long ago as 2014.  Lately, she has had dyspnea at rest, saw her Pulmonologist in late December who recommended increasing prednisone and/or considering palliative options.    She did not tolerate the higher dose of prednisone, but was stable until the last few days, when she has had worsening of her dyspnea at rest, worsening of her cough that now sounds productive.  Finally today she had fever and so husband brought her to the ER.  ED course: - Temp 101.79F, heart rate 106, respirations 22-26, blood pressure 154/83, pulse ox 92% on home oxygen -Na 142, K 3.8, Cr 0.9, WBC 14.1K, Hgb 13.2 - INR normal - Lactic acid normal - ECG showed sinus tachycardia - Chest x-ray showed kyphosis, emphysema, no focal airspace disease - Blood cultures were obtained she was given Solu-Medrol and ceftriaxone and albuterol, and the hospital service were asked to evaluate for COPD exacerbation     ROS: Review of Systems  Constitutional: Positive for fever and malaise/fatigue.  Respiratory: Positive for cough, sputum production, shortness of breath and wheezing. Negative for hemoptysis.   Cardiovascular: Positive for leg swelling. Negative for chest pain.  All other systems reviewed and are negative.         Past Medical History:  Diagnosis Date  . ANXIETY 01/01/2007  . Blood transfusion without reported diagnosis  2015  . BURSITIS, RIGHT HIP 06/04/2009  . CAD (coronary artery disease)    a. BMS to LAD 2010. b. NSTEMI with DES to LAD for ISR 2011. c. Patent stent 03/2012/Imdur added.  . Cataract   . Colon polyps    H/o tubular adenoma of colon  . COPD 01/01/2007   a. Chronic resp failure on home O2.  Marland Kitchen DEPRESSION 01/01/2007  . Eczema 01/08/2011  . GROIN PAIN 06/20/2008  . Headache(784.0) 01/01/2007  . Hemorrhoid   . HYPERTENSION 01/01/2007  . Impaired glucose tolerance 01/07/2011  . LOW BACK PAIN 01/01/2007  . Muscle weakness (generalized) 06/04/2009  . On home oxygen therapy    "2L; 24/7" (03/21/2018)  . OSTEOARTHRITIS, HIP 07/01/2008  . OSTEOPOROSIS 01/01/2007  . Pneumonia 1998  . Rosacea 01/08/2011  . SYNCOPE 01/01/2007  . Thoracic compression fracture ([place])   . TRANSIENT ISCHEMIC ATTACK, HX OF 01/01/2007    Past Surgical History:  Procedure Laterality Date  . ABDOMINAL HYSTERECTOMY    . APPENDECTOMY    . BACK SURGERY    . BREAST ENHANCEMENT SURGERY    . COLONOSCOPY  01/25/2002   tubular adenoma,hemorrhoids, hyperplastic  colon polyps  . COLONOSCOPY  02/17/2005   hemorrhoids  . CORONARY ANGIOPLASTY    . CORONARY STENT PLACEMENT    . ESOPHAGOGASTRODUODENOSCOPY (EGD) WITH PROPOFOL N/A 08/10/2017   Procedure: ESOPHAGOGASTRODUODENOSCOPY (EGD) WITH PROPOFOL;  Surgeon: Gatha Mayer, MD;  Location: [facility];  Service: Endoscopy;  Laterality: N/A;  . HIP ARTHROPLASTY Left 10/01/2013   Procedure: ARTHROPLASTY UNIPOLAR   HIP;  Surgeon: Marianna Payment, MD;  Location: [facility];  Service: Orthopedics;  Laterality: Left;  . HIP SURGERY Left    DR XU     PROXIMAL NECK   . IR GENERIC HISTORICAL  02/19/2016   IR VERTEBROPLASTY CERV/THOR BX INC UNI/BIL INC/INJECT/IMAGING 02/19/2016 Luanne Bras, MD [facility]-INTER[facility] RAD  . IR GENERIC HISTORICAL  07/15/2016   IR KYPHO THORACIC WITH BONE BIOPSY 07/15/2016 Luanne Bras, MD [facility]  . IR KYPHO EA ADDL LEVEL THORACIC OR LUMBAR  09/14/2016  . IR  RADIOLOGIST EVAL & MGMT  09/22/2016  . IR VERTEBROPLASTY CERV/THOR BX INC UNI/BIL INC/INJECT/IMAGING  10/08/2016  . IR VERTEBROPLASTY CERV/THOR BX INC UNI/BIL INC/INJECT/IMAGING  11/16/2016  . KYPHOPLASTY  12/2015-10/2016 X 5   "?2lumbar & 3 thoracic"  . LEFT HEART CATHETERIZATION WITH CORONARY ANGIOGRAM N/A 04/13/2012   Procedure: LEFT HEART CATHETERIZATION WITH CORONARY ANGIOGRAM;  Surgeon: Burnell Blanks, MD;  Location: [facility] CATH LAB;  Service: Cardiovascular;  Laterality: N/A;  . OOPHORECTOMY     one ovary  . s/p bilat cataract  2010  . sp lumbar disc surgury     [name]    Social History: Patient lives with her husband.  The patient is mostly nonambulatory.  Remote former smoker.  Allergies  Allergen Reactions  . Aspirin Other (See Comments)     cns bleed risk  . Codeine Anaphylaxis and Rash  . Other Other (See Comments)    Stiolto - severe reaction - caused inability to breath    Family history: family history includes Cardiomyopathy in her sister; Emphysema in her sister; Heart attack in her sister; Heart disease in her sister; Osteoporosis in her mother; Seizures in her sister.  Prior to Admission medications   Medication Sig Start Date End Date Taking? Authorizing Provider  acetaminophen (TYLENOL) 500 MG tablet Take 1,000 mg by mouth every 6 (six) hours as needed for moderate pain or headache.     [provider]  Cholecalciferol (VITAMIN D) 1000 UNITS capsule Take 1,000 Units by mouth every evening.     [provider]  clopidogrel (PLAVIX) 75 MG tablet TAKE 1 TABLET BY MOUTH EVERY DAY Patient taking differently: Take 75 mg by mouth daily.  01/12/18   Burnell Blanks, MD  diclofenac sodium (VOLTAREN) 1 % GEL APPLY 4 GRAMS TOPICALLY 4 TIMES DAILY Patient taking differently: Apply 4 g topically 4 (four) times daily.  04/04/18   Biagio Borg, MD  docusate sodium (COLACE) 100 MG capsule Take 100 mg by mouth daily as needed for mild  constipation.     [provider]  furosemide (LASIX) 20 MG tablet Take 1 tablet (20 mg total) by mouth 2 (two) times daily as needed. 03/11/17   Biagio Borg, MD  hydrOXYzine (ATARAX/VISTARIL) 25 MG tablet TAKE 1 OR 2 TABLETS BY MOUTH EVERY 8 HOURS AS NEEDED FOR ANXIETY Patient taking differently: Take 25 mg by mouth 3 (three) times daily. Taking one tablet daily 02/28/18   Biagio Borg, MD  isosorbide mononitrate (IMDUR) 30 MG 24 hr tablet TAKE 0.5 TABLETS (15 MG TOTAL) BY MOUTH DAILY. 04/11/18   Burnell Blanks, MD  lovastatin (MEVACOR) 20 MG tablet TAKE 20 MG BY MOUTH AT BEDTIME Patient taking differently: Take 20 mg by mouth at bedtime.  02/15/18   Biagio Borg, MD  metoprolol tartrate (LOPRESSOR) 25 MG tablet TAKE 1 TABLET  BY MOUTH TWICE A DAY NEEDS APPT Patient taking differently: Take 25 mg by mouth 2 (two) times daily.  03/16/18   Burnell Blanks, MD  nitroGLYCERIN (NITROSTAT) 0.4 MG SL tablet Place 1 tablet (0.4 mg total) under the tongue every 5 (five) minutes as needed for chest pain (up to 3 doses). 04/13/12   Dunn, Nedra Hai, PA-C  ondansetron (ZOFRAN) 4 MG tablet Take 1 tablet (4 mg total) by mouth every 8 (eight) hours as needed for nausea or vomiting. 01/06/16   Mackuen, Courteney Lyn, MD  OXYGEN Inhale 2 L into the lungs continuous.     [provider]  polyethylene glycol (MIRALAX / GLYCOLAX) packet Take 17 g by mouth daily as needed for mild constipation.    [provider]  polyvinyl alcohol (LIQUIFILM TEARS) 1.4 % ophthalmic solution Place 1 drop into both eyes daily as needed for dry eyes.     [provider]  potassium chloride (K-DUR) 10 MEQ tablet Take 1 tablet (10 mEq total) by mouth daily. 08/15/17   Biagio Borg, MD  predniSONE (DELTASONE) 10 MG tablet TAKE 1 1/2 TABLET BY MOUTH DAILY WITH BREAKFAST Patient taking differently: Take 15 mg by mouth daily with breakfast.  06/07/17   Collene Gobble, MD  predniSONE (DELTASONE)  20 MG tablet Take 1 tablet (20 mg total) by mouth daily with breakfast. 05/12/18   Collene Gobble, MD  PROAIR HFA 108 [phone] Base) MCG/ACT inhaler INHALE 2 PUFFS INTO THE LUNGS EVERY 6 (SIX) HOURS AS NEEDED FOR WHEEZING OR SHORTNESS OF BREATH. 05/09/18   Biagio Borg, MD  tiotropium (SPIRIVA HANDIHALER) 18 MCG inhalation capsule Place 1 capsule (18 mcg total) into inhaler and inhale daily at 6 (six) AM. 04/11/18   Biagio Borg, MD  traMADol (ULTRAM) 50 MG tablet Take 100 mg by mouth 2 (two) times daily.     [provider]  traZODone (DESYREL) 50 MG tablet Take 50 mg by mouth at bedtime.    [provider]  vitamin B-12 (CYANOCOBALAMIN) 1000 MCG tablet Take 1,000 mcg by mouth daily.    [provider]  vitamin C (ASCORBIC ACID) 500 MG tablet Take 500 mg by mouth 2 (two) times daily.    [provider]       Physical Exam: BP (!) 143/80   Pulse (!) 104   Temp (!) 101.4 F (38.6 C) (Rectal)   Resp 16   Ht 5\' 7"  (1.702 m)   Wt 55.3 kg   SpO2 97%   BMI 19.11 kg/m  General appearance: Thin elderly ill-appearing adult female, awake but inattentive, moderate respiratory distress.   Eyes: Anicteric, conjunctiva pink, lids and lashes normal. PERRL.    ENT: No nasal deformity, discharge, epistaxis.  Hearing normal. OP moist without lesions.  Dentures in place, lips normal. Neck: No neck masses.  Trachea midline.  No thyromegaly/tenderness. Lymph: No cervical or supraclavicular lymphadenopathy. Skin: Warm and dry.  No jaundice.  No suspicious rashes or lesions.  Venous stasis changes in lower legs Cardiac: Tachycardic, regular, nl S1-S2, no murmurs appreciated.  Capillary refill is brisk.  JVP not visible.  1+ bilateral LE edema.  Radial pulses 2+ and symmetric. Respiratory: Tachypneic, weak accessory muscle use, wheezing bilaterally, diminished throughout, no rales.   Abdomen: Abdomen soft.  No TTP, no guarding. No ascites, distension, hepatosplenomegaly.     MSK: No deformities or effusions of the large joints of the upper or lower extremities bilaterally.  No cyanosis  or clubbing. Loss of subcutaneous muscle mass and fat Neuro: Cranial nerves 3 through 12 intact. Speech is fluent.  Muscle strength 4/5 but symmetrically weak.    Psych: Sensorium intact and responding to questions, attention diminished, oriented to self, husband, [place] long, not oriented to month or year, required prompting.  Behavior appropriate.  Affect blunted.  Judgment and insight appear mildly impaired.     Labs on Admission:  I have personally reviewed following labs and imaging studies: CBC: Recent Labs  Lab 05/28/18 1118  WBC 14.1*  NEUTROABS 11.0*  HGB 13.2  HCT 43.1  MCV 91.5  PLT 778   Basic Metabolic Panel: Recent Labs  Lab 05/28/18 1118  NA 142  K 3.8  CL 96*  CO2 37*  GLUCOSE 86  BUN 15  CREATININE 0.89  CALCIUM 9.0   GFR: Estimated Creatinine Clearance: 46.9 mL/min (by C-G formula based on SCr of 0.89 mg/dL).  Liver Function Tests: Recent Labs  Lab 05/28/18 1118  AST 14*  ALT 10  ALKPHOS 62  BILITOT 1.2  PROT 5.9*  ALBUMIN 3.3*   Coagulation Profile: Recent Labs  Lab 05/28/18 1118  INR 0.87   Sepsis Labs: Lactic acid 1.4         Radiological Exams on Admission: Personally reviewed CXR shows kyphosis, flat diaphragm, emphysema, no focal airspace disease or effusions or edema: Dg Chest 2 View  Result Date: 05/28/2018 CLINICAL DATA:  Shortness of breath. EXAM: CHEST - 2 VIEW COMPARISON:  03/22/2018 FINDINGS: Normal heart size. No pleural effusion or edema identified. No airspace opacities identified. Bilateral breast implants with capsular calcification noted. The bones are diffusely osteopenic. Multiple thoracic vertebral compression deformities are identified which have been treated with bone cement. IMPRESSION: No active cardiopulmonary disease. Electronically Signed   By: Kerby Moors M.D.   On: 05/28/2018 11:43     EKG: Independently reviewed.  Rate 101, QTc 450, no ST changes.       Assessment/Plan  Acute on chronic respiratory failure with hypercarbia and hypoxia  COPD exacerbation Recent URI seems to have provoked COPD exacerbation.  FEV1 30% in 2014.    -Solu-Medrol IV -Scheduled bronchodilators -PRN bronchodilators  -Continue home Spiriva -Flutter valve -We will start low-dose oral morphine for dyspnea or cough, discussed with husband  -We will continue ceftriaxone for now -Check procalcitonin -Repeat chest x-ray in the morning after IV fluids -Low threshold to de-escalate antibiotics    History of coronary disease, secondary prevention Hypertension -Continue Imdur, lovastatin, metoprolol -Hold home as needed Lasix  Acute metabolic encephalopathy Husband is unclear if patient is sometimes confused about month or not.  To me she appears inattentive and encephalopathic, I suspect from fever, mild dementia.  Her pH normal on VBG, and BMP and Pulm notes suggest she is chronically hypercarbic, so I do not think this is acute hypercarbia. -IV fluids -Bronchodilators -Acetaminophen for fever          DVT prophylaxis: Lovenox  Code Status: FULL  Family Communication: Husband at bedside  Disposition Plan: Anticipate IV fluids, IV steroids, frequent nebulized bronchodilators.  De-escalate antibiotics if able. Consults called: None Admission status: INAPTIENT   At the time of admission, it appears that the appropriate admission status for this patient is INPATIENT. This is judged to be reasonable and necessary in order to provide the required intensity of service to ensure the patient's safety given: -presenting symptoms of worsening dyspnea, worsening cough, new sputum production fever -physical exam findings of fever, tachypnea, tachycardia, encpehalopathy/altered  mentation -in the context of their chronic comorbidities COPD on chronic oxygen and chronic prednisone  with FEV1 30% 6 years ago    Together, these circumstances are felt to place her at high risk for further clinical deterioration threatening life, limb, or organ requiring a high intensity of service due to this severe exacerbation of their chronic organ failure  I certify that at the point of admission it is my clinical judgment that the patient will require inpatient hospital care spanning beyond 2 midnights from the point of admission and that early discharge would result in unnecessary risk of decompensation and readmission or threat to life, limb or bodily function.   Medical decision making: Patient seen at 1:43 PM on 05/28/2018.  The patient was discussed with Dr. Ralene Bathe.  What exists of the patient's chart was reviewed in depth and summarized above.  Clinical condition: Hemodynamically and respiratory stable.        [facility] Pager: please page via [email or web address], enter password "[email or web address]", and identify attending under [facility]

## 2018-05-28 NOTE — Progress Notes (Addendum)
Patient states she has note voided since 0900. Bladder scan done showed 702 ml in bladder. Patient assisted to [facility] with assist x 2 and voided 625 ml

## 2018-05-28 NOTE — ED Notes (Signed)
Bed: WA20 Expected date:  Expected time:  Means of arrival:  Comments: Resp problems

## 2018-05-29 ENCOUNTER — Inpatient Hospital Stay (HOSPITAL_COMMUNITY): Payer: Medicare Other

## 2018-05-29 DIAGNOSIS — R7989 Other specified abnormal findings of blood chemistry: Secondary | ICD-10-CM

## 2018-05-29 DIAGNOSIS — J441 Chronic obstructive pulmonary disease with (acute) exacerbation: Secondary | ICD-10-CM

## 2018-05-29 LAB — CBC WITH DIFFERENTIAL/PLATELET
Abs Immature Granulocytes: 0.06 10*3/uL (ref 0.00–0.07)
Basophils Absolute: 0 10*3/uL (ref 0.0–0.1)
Basophils Relative: 0 %
EOS PCT: 0 %
Eosinophils Absolute: 0 10*3/uL (ref 0.0–0.5)
HCT: 36.3 % (ref 36.0–46.0)
Hemoglobin: 11.1 g/dL — ABNORMAL LOW (ref 12.0–15.0)
Immature Granulocytes: 1 %
Lymphocytes Relative: 8 %
Lymphs Abs: 0.8 10*3/uL (ref 0.7–4.0)
MCH: 27.4 pg (ref 26.0–34.0)
MCHC: 30.6 g/dL (ref 30.0–36.0)
MCV: 89.6 fL (ref 80.0–100.0)
MONO ABS: 0.2 10*3/uL (ref 0.1–1.0)
Monocytes Relative: 2 %
Neutro Abs: 8.5 10*3/uL — ABNORMAL HIGH (ref 1.7–7.7)
Neutrophils Relative %: 89 %
PLATELETS: 188 10*3/uL (ref 150–400)
RBC: 4.05 MIL/uL (ref 3.87–5.11)
RDW: 12.4 % (ref 11.5–15.5)
WBC: 9.5 10*3/uL (ref 4.0–10.5)
nRBC: 0 % (ref 0.0–0.2)

## 2018-05-29 LAB — BASIC METABOLIC PANEL
Anion gap: 9 (ref 5–15)
BUN: 19 mg/dL (ref 8–23)
CO2: 30 mmol/L (ref 22–32)
Calcium: 8 mg/dL — ABNORMAL LOW (ref 8.9–10.3)
Chloride: 101 mmol/L (ref 98–111)
Creatinine, Ser: 0.73 mg/dL (ref 0.44–1.00)
GFR calc Af Amer: >60 mL/min (ref >60)
GFR calc non Af Amer: >60 mL/min (ref >60)
Glucose, Bld: 113 mg/dL — ABNORMAL HIGH (ref 70–99)
Potassium: 4.2 mmol/L (ref 3.5–5.1)
Sodium: 140 mmol/L (ref 135–145)

## 2018-05-29 LAB — PROCALCITONIN: Procalcitonin: 0.39 ng/mL

## 2018-05-29 MED ORDER — IOPAMIDOL (ISOVUE-370) INJECTION 76%
INTRAVENOUS | Status: AC
  Administered 2018-05-29: 14:00:00
  Filled 2018-05-29: qty 100

## 2018-05-29 MED ORDER — IOPAMIDOL (ISOVUE-370) INJECTION 76%
100.0000 mL | Freq: Once | INTRAVENOUS | Status: AC | PRN
  Administered 2018-05-29: 100 mL via INTRAVENOUS

## 2018-05-29 MED ORDER — SODIUM CHLORIDE (PF) 0.9 % IJ SOLN
INTRAMUSCULAR | Status: AC
  Administered 2018-05-29: 14:00:00
  Filled 2018-05-29: qty 50

## 2018-05-29 MED ORDER — METHYLPREDNISOLONE SODIUM SUCC 40 MG IJ SOLR
40.0000 mg | Freq: Two times a day (BID) | INTRAMUSCULAR | Status: DC
  Administered 2018-05-29 – 2018-05-30 (×2): 40 mg via INTRAVENOUS
  Filled 2018-05-29 (×2): qty 1

## 2018-05-29 MED ORDER — GUAIFENESIN-DM 100-10 MG/5ML PO SYRP
5.0000 mL | ORAL_SOLUTION | ORAL | Status: DC | PRN
  Administered 2018-05-29: 5 mL via ORAL
  Filled 2018-05-29: qty 10

## 2018-05-29 NOTE — Progress Notes (Signed)
Nutrition Brief Note  RD consulted via COPD protocol.   Patient with stable weight. Pt is currently eating 100% of meals.  Wt Readings from Last 15 Encounters:  05/28/18 55.3 kg  04/11/18 55.3 kg  03/21/18 55.3 kg  03/09/18 55.3 kg  02/16/18 55.8 kg  12/21/17 54.4 kg  11/21/17 54.5 kg  10/10/17 53.1 kg  09/21/17 54 kg  09/05/17 54.5 kg  08/15/17 55.3 kg  08/10/17 54.4 kg  08/02/17 54.4 kg  07/19/17 54.8 kg  07/08/17 54.4 kg    Body mass index is 19.11 kg/m. Patient meets criteria for normal based on current BMI.   Current diet order is regular, patient is consuming approximately 100% of meals at this time. Labs and medications reviewed.   No nutrition interventions warranted at this time. If nutrition issues arise, please consult RD.   Clayton Bibles, MS, RD, [place] Dietitian Pager: [phone] After Hours Pager: [phone]

## 2018-05-29 NOTE — Progress Notes (Signed)
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  Evaluation in the ED patient had a temperature of 101, respiration 26, white blood cell 14, chest x-ray no focal airspace disease.  Patient received a dose of IV Solu-Medrol and ceftriaxone and albuterol.  Assessment & Plan:   Principal Problem:   COPD exacerbation ([place]) Active Problems:   Essential hypertension   COPD (chronic obstructive pulmonary disease) with emphysema (HCC)   URI (upper respiratory infection)   Chronic respiratory failure (HCC)   Peripheral vascular disease (HCC)   Secondary pulmonary arterial hypertension (HCC)  Acute on chronic COPD exacerbation: Suspect related to viral versus bacterial illness. Continue with IV ceftriaxone, IV Solu-Medrol.  Albuterol.  Ipratropium. She noticed some improvement but is not at baseline. D-dimer elevated at 1.4, Dopplers of the lower extremity negative for DVT.  Will check a CT angios to rule out PE. Low-dose morphine for dyspnea PRN.  History of coronary artery disease, hypertension: Continue with indoor, lovastatin, metoprolol. Also risk Lasix tomorrow.  Acute metabolic encephalopathy Related to fever. pH compensated for CO2. Appears improved.    Estimated body mass index is 19.11 kg/m as calculated from the following:   Height as of this encounter: 5\' 7"  (1.702 m).   Weight as of this encounter: 55.3 kg.   DVT prophylaxis:scd Code Status: full code Family Communication: care discussed with husband Disposition Plan: remain in the hospital for IV antibiotics, management of COPD exacerbation     Consultants:   none  Procedures:  Doppler; no DVT   Antimicrobials:   Ceftriaxone.    Subjective: She report breathing better, not at baseline  Objective: Vitals:   05/28/18 2015 05/28/18 2021 05/29/18 0646 05/29/18 0804  BP:  (!) 131/94 (!) 144/68   Pulse:  78 67   Resp:  18 19   Temp:  97.7 F (36.5 C) 98.1 F (36.7 C)   TempSrc:  Oral Oral   SpO2: 98% 98% 98% 98%  Weight:      Height:        Intake/Output Summary (Last 24 hours) at 05/29/2018 1307 Last data filed at 05/29/2018 0900 Gross per 24 hour  Intake 159.1 ml  Output 625 ml  Net -465.9 ml   Filed Weights   05/28/18 1047  Weight: 55.3 kg    Examination:  General exam: Appears calm and comfortable  Respiratory system: kyphosis, bilateral ronchus Cardiovascular system: S1 & S2 heard, RRR. No JVD, murmurs, rubs, gallops or clicks. No pedal edema. Gastrointestinal system: Abdomen is nondistended, soft and nontender. No organomegaly or masses felt. Normal bowel sounds heard. Central nervous system: Alert and oriented. No focal neurological deficits. Extremities: Symmetric 5 x 5 power. Skin: multiples ecchymosis.     Data Reviewed: I have personally reviewed following labs and imaging studies  CBC: Recent Labs  Lab 05/28/18 1118 05/29/18 0557  WBC 14.1* 9.5  NEUTROABS 11.0* 8.5*  HGB 13.2 11.1*  HCT 43.1 36.3  MCV 91.5 89.6  PLT 205 867   Basic Metabolic Panel: Recent Labs  Lab 05/28/18 1118 05/29/18 0557  NA 142 140  K 3.8 4.2  CL 96* 101  CO2 37* 30  GLUCOSE 86 113*  BUN 15 19  CREATININE 0.89 0.73  CALCIUM 9.0 8.0*   GFR: Estimated Creatinine Clearance: 52.2 mL/min (by C-G formula based on SCr of 0.73 mg/dL). Liver Function Tests: Recent Labs  Lab 05/28/18 1118  AST 14*  ALT 10  ALKPHOS 62  BILITOT 1.2  PROT 5.9*  ALBUMIN 3.3*   No results for input(s): LIPASE, AMYLASE in the last 168 hours. No results for input(s): AMMONIA in the last 168 hours. Coagulation  Profile: Recent Labs  Lab 05/28/18 1118  INR 0.87   Cardiac Enzymes: No results for input(s): CKTOTAL, CKMB, CKMBINDEX, TROPONINI in the last 168 hours. BNP (last 3 results) No results for input(s): PROBNP in the last 8760 hours. HbA1C: No results for input(s): HGBA1C in the last 72 hours. CBG: No results for input(s): GLUCAP in the last 168 hours. Lipid Profile: No results for input(s): CHOL, HDL, LDLCALC, TRIG, CHOLHDL, LDLDIRECT in the last 72 hours. Thyroid Function Tests: No results for input(s): TSH, T4TOTAL, FREET4, T3FREE, THYROIDAB in the last 72 hours. Anemia Panel: No results for input(s): VITAMINB12, FOLATE, FERRITIN, TIBC, IRON, RETICCTPCT in the last 72 hours. Sepsis Labs: Recent Labs  Lab 05/28/18 1118 05/28/18 1126 05/28/18 1355 05/29/18 0557  PROCALCITON <0.10  --   --  0.39  LATICACIDVEN  --  1.21 1.20  --     No results found for this or any previous visit (from the past 240 hour(s)).       Radiology Studies: Dg Chest 2 View  Result Date: 05/29/2018 CLINICAL DATA:  Cough EXAM: CHEST - 2 VIEW COMPARISON:  05/28/2018 FINDINGS: Artifact from calcified breast implants. Normal heart size.  Coronary stent is noted. Chronic hyperinflation and interstitial coarsening. There is chronic blunting of posterior costophrenic sulci. There is no edema, consolidation, effusion, or pneumothorax. Remote left rib fractures. Remote compression fractures and cement augmentation. IMPRESSION: COPD without acute superimposed finding. Electronically Signed   By: Monte Fantasia M.D.   On: 05/29/2018 09:22   Dg Chest 2 View  Result Date: 05/28/2018 CLINICAL DATA:  Shortness of breath. EXAM: CHEST - 2 VIEW COMPARISON:  03/22/2018 FINDINGS: Normal heart size. No pleural effusion or edema identified. No airspace opacities identified. Bilateral breast implants with capsular calcification noted. The bones are diffusely osteopenic. Multiple thoracic vertebral compression deformities are  identified which have been treated with bone cement. IMPRESSION: No active cardiopulmonary disease. Electronically Signed   By: Kerby Moors M.D.   On: 05/28/2018 11:43   Vas [place] Lower Extremity Venous (dvt)  Result Date: 05/29/2018  Lower Venous Study Indications: Edema, and elevated D-dimer.  Limitations: Body habitus and immobility. Comparison Study: Lower extremity venous duplex exam on 03/16/2018 Performing Technologist: Rudell Cobb  Examination Guidelines: A complete evaluation includes B-mode imaging, spectral Doppler, color Doppler, and power Doppler as needed of all accessible portions of each vessel. Bilateral testing is considered an integral part of a complete examination. Limited examinations for reoccurring indications may be performed as noted.  Right Venous Findings: +---------+---------------+---------+-----------+----------+-------------------+          CompressibilityPhasicitySpontaneityPropertiesSummary             +---------+---------------+---------+-----------+----------+-------------------+ CFV      Full           Yes      Yes                                      +---------+---------------+---------+-----------+----------+-------------------+  SFJ      Full                                                             +---------+---------------+---------+-----------+----------+-------------------+ FV Prox  Full                                                             +---------+---------------+---------+-----------+----------+-------------------+ FV Mid   Full                                                             +---------+---------------+---------+-----------+----------+-------------------+ FV DistalFull                                         dilated segment     +---------+---------------+---------+-----------+----------+-------------------+ PFV      Full                                                              +---------+---------------+---------+-----------+----------+-------------------+ POP      Full           Yes      Yes                                      +---------+---------------+---------+-----------+----------+-------------------+ PTV      Full                    Yes                  patent, some                                                              segments are not                                                          well visualized  with compression    +---------+---------------+---------+-----------+----------+-------------------+ PERO     Full                                         some segments are                                                         not well visualized                                                       with compression    +---------+---------------+---------+-----------+----------+-------------------+  Left Venous Findings: +---------+---------------+---------+-----------+----------+-------------------+          CompressibilityPhasicitySpontaneityPropertiesSummary             +---------+---------------+---------+-----------+----------+-------------------+ CFV      Full           Yes      Yes                                      +---------+---------------+---------+-----------+----------+-------------------+ SFJ      Full                                                             +---------+---------------+---------+-----------+----------+-------------------+ FV Prox  Full                                                             +---------+---------------+---------+-----------+----------+-------------------+ FV Mid   Full                                                             +---------+---------------+---------+-----------+----------+-------------------+ FV DistalFull                                          poor flow           +---------+---------------+---------+-----------+----------+-------------------+ PFV      Full                                                             +---------+---------------+---------+-----------+----------+-------------------+ POP      Full  Yes      Yes                                      +---------+---------------+---------+-----------+----------+-------------------+ PTV                              Yes                  patent, some                                                              segments are not                                                          well visualized                                                           with compression    +---------+---------------+---------+-----------+----------+-------------------+ PERO                                                  some segments are                                                         not well visualized                                                       with compression    +---------+---------------+---------+-----------+----------+-------------------+    Summary: Right: There is no evidence of deep vein thrombosis in the lower extremity. However, portions of this examination were limited- see technologist comments above. No cystic structure found in the popliteal fossa. Left: There is no evidence of deep vein thrombosis in the lower extremity. However, portions of this examination were limited- see technologist comments above. No cystic structure found in the popliteal fossa.  *See table(s) above for measurements and observations.    Preliminary         Scheduled Meds: . albuterol  2.5 mg Nebulization TID  . clopidogrel  75 mg Oral Q breakfast  . diclofenac sodium  4 g Topical QID  . hydrOXYzine  25 mg Oral TID  . isosorbide mononitrate  15 mg Oral Daily  . methylPREDNISolone (SOLU-MEDROL) injection  40 mg  Intravenous Q6H   Followed  by  . Derrill Memo ON 05/30/2018] predniSONE  40 mg Oral Q breakfast  . metoprolol tartrate  25 mg Oral BID  . pravastatin  20 mg Oral q1800  . tiotropium  18 mcg Inhalation Daily  . traMADol  100 mg Oral BID  . traZODone  50 mg Oral QHS   Continuous Infusions: . cefTRIAXone (ROCEPHIN)  IV 1 g (05/29/18 1213)     LOS: 1 day    Time spent: 35 minutes.     Elmarie Shiley, MD [facility] Pager [phone]  If 7PM-7AM, please contact night-coverage [email or web address] Password [email or web address] 05/29/2018, 1:07 PM

## 2018-05-29 NOTE — Progress Notes (Signed)
PT Cancellation Note  Patient Details Name: Alexis Higgins MRN: [id number] DOB: Dec 03, 1940   Cancelled Treatment:    Reason Eval/Treat Not Completed: Other (comment)Initiated evaluation, Radiology came to take patient to procedure. Patient's spouse informed therapist that patient can not get OOB even though patient transfers at home with his assist. Continue attempts for eval. Spouse adamantly declined SNF.   Claretha Cooper 05/29/2018, 9:25 AM [place] Pager [phone] Office [phone]

## 2018-05-29 NOTE — Progress Notes (Signed)
PT Cancellation Note  Patient Details Name: Alexis Higgins MRN: [id number] DOB: 1940/09/29   Cancelled Treatment:    Reason Eval/Treat Not Completed: Medical issues which prohibited therapy, now to R/O PE and DVT. Will check back another time.   Claretha Cooper 05/29/2018, 10:22 AM [place] Pager [phone] Office [phone]

## 2018-05-29 NOTE — Progress Notes (Signed)
Bilateral lower extremities venous duplex exam completed. Please see preliminary notes on CV PROC under chart review. Elzy Tomasello H Dezaree Tracey(RDMS RVT) 05/29/18 11:31 AM

## 2018-05-29 NOTE — Progress Notes (Signed)
OT Cancellation Note  Patient Details Name: Alexis Higgins MRN: [id number] DOB: 16-Oct-1940   Cancelled Treatment:    Reason Eval/Treat Not Completed: Medical issues which prohibited therapy  Noted ruling out DVT - will check on pt later in day or next day Kari Baars, [place] [phone] Office- [place], Edwena Felty D 05/29/2018, 12:12 PM

## 2018-05-30 MED ORDER — PREDNISONE 10 MG PO TABS: ORAL_TABLET | ORAL | 0 refills | Status: DC

## 2018-05-30 MED ORDER — AMOXICILLIN-POT CLAVULANATE 875-125 MG PO TABS: 1.0000 | ORAL_TABLET | Freq: Two times a day (BID) | ORAL | 0 refills | Status: AC

## 2018-05-30 NOTE — Discharge Summary (Signed)
Physician Discharge Summary  Alexis Higgins WJX:[id number] DOB: May 22, 1941 DOA: 05/28/2018  PCP: Biagio Borg, MD  Admit date: 05/28/2018 Discharge date: 05/30/2018  Admitted From: Home  Disposition:  Home   Recommendations for Outpatient Follow-up:  1. Follow up with PCP in 1-2 weeks 2. Please obtain BMP/CBC in one week 3. Consider referral to palliative care, need to continue to have conversation with husband about palliative care.     Discharge Condition: stable.  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  Evaluation in the ED patient had a temperature of 101, respiration 26, white blood cell 14, chest x-ray no focal airspace disease.  Patient received a dose of IV Solu-Medrol and ceftriaxone and albuterol.  Assessment & Plan:   Principal Problem:   COPD exacerbation ([place]) Active Problems:   Essential hypertension   COPD (chronic obstructive pulmonary disease) with emphysema (HCC)   URI (upper respiratory infection)   Chronic respiratory failure (HCC)   Peripheral vascular disease (HCC)   Secondary pulmonary arterial hypertension (HCC)  Acute on chronic COPD exacerbation: Suspect related to viral versus bacterial illness. Treated with  with IV ceftriaxone, IV Solu-Medrol,  Albuterol,  Ipratropium while inpatient.  She noticed some improvement but is not at baseline. D-dimer elevated at 1.4, Dopplers of the lower extremity negative for DVT. CT angio negative for PE.  Low-dose morphine for dyspnea PRN. Patient never received morphine. Decline.  Patient remain stable. She will be discharge on prednisone taper, risk benefit  discussed with husband due to prior history of compression fracture.  She will be discharge on Augmentin also to complete 5 days.  I offer palliative care referral, patient and husband decline.  Needs to follow up with Dr Lamonte Sakai regarding pulmonary Hypertension.   History of coronary artery disease, hypertension: Continue with indoor, lovastatin, metoprolol. Resume lasix at discharge  Acute metabolic encephalopathy Related to fever. pH compensated for CO2. Resolved.    Discharge Diagnoses:  Principal Problem:   COPD exacerbation ([place]) Active Problems:   Essential hypertension   COPD (chronic obstructive pulmonary disease) with emphysema (HCC)   URI (upper respiratory infection)   Chronic respiratory failure (HCC)   Peripheral vascular disease (HCC)   Secondary pulmonary arterial hypertension ([place])    Discharge Instructions  Discharge Instructions    Diet - low sodium heart healthy   Complete by:  As directed    Increase activity slowly   Complete by:  As directed      Allergies as of 05/30/2018      Reactions   Aspirin Other (See Comments)    cns bleed risk   Codeine Anaphylaxis, Rash   Other Other (See Comments)   Stiolto - severe reaction - caused inability to breath      Medication List    TAKE these medications   acetaminophen 500 MG tablet Commonly known as:  TYLENOL Take 1,000 mg by mouth every 6 (six) hours as needed for moderate pain or headache.   amoxicillin-clavulanate 875-125 MG tablet Commonly known as:  AUGMENTIN Take 1 tablet by mouth 2 (two) times daily for 5 days.   clopidogrel 75 MG tablet Commonly known as:  PLAVIX TAKE 1 TABLET BY MOUTH EVERY DAY  diclofenac sodium 1 % Gel Commonly known as:  VOLTAREN APPLY 4 GRAMS TOPICALLY 4 TIMES DAILY What changed:  See the new instructions.   docusate sodium 100 MG capsule Commonly known as:  COLACE Take 100 mg by mouth daily as needed for mild constipation.   furosemide 20 MG  tablet Commonly known as:  LASIX Take 1 tablet (20 mg total) by mouth 2 (two) times daily as needed.   hydrOXYzine 25 MG tablet Commonly known as:  ATARAX/VISTARIL TAKE 1 OR 2 TABLETS BY MOUTH EVERY 8 HOURS AS NEEDED FOR ANXIETY What changed:    how much to take  how to take this  when to take this  additional instructions   isosorbide mononitrate 30 MG 24 hr tablet Commonly known as:  IMDUR TAKE 0.5 TABLETS (15 MG TOTAL) BY MOUTH DAILY. What changed:  See the new instructions.   lovastatin 20 MG tablet Commonly known as:  MEVACOR TAKE 20 MG BY MOUTH AT BEDTIME What changed:    how much to take  how to take this  when to take this  additional instructions   metoprolol tartrate 25 MG tablet Commonly known as:  LOPRESSOR TAKE 1 TABLET BY MOUTH TWICE A DAY NEEDS APPT What changed:  See the new instructions.   nitroGLYCERIN 0.4 MG SL tablet Commonly known as:  NITROSTAT Place 1 tablet (0.4 mg total) under the tongue every 5 (five) minutes as needed for chest pain (up to 3 doses).   ondansetron 4 MG tablet Commonly known as:  ZOFRAN Take 1 tablet (4 mg total) by mouth every 8 (eight) hours as needed for nausea or vomiting.   OXYGEN Inhale 2 L into the lungs continuous.   polyethylene glycol packet Commonly known as:  MIRALAX / GLYCOLAX Take 17 g by mouth daily as needed for mild constipation.   polyvinyl alcohol 1.4 % ophthalmic solution Commonly known as:  LIQUIFILM TEARS Place 1 drop into both eyes daily as needed for dry eyes.   potassium chloride 10 MEQ tablet Commonly known as:  K-DUR Take 1 tablet (10 mEq total) by mouth daily.   predniSONE 10 MG tablet Commonly known as:  DELTASONE Take 4 tablets for 2 days then 3 tablets for 3 days then resume prior home dose of 15 mg daily. What changed:    See the new instructions.  Another medication with the same name was removed. Continue taking this medication, and follow the directions you see here.    PROAIR HFA 108 (90 Base) MCG/ACT inhaler Generic drug:  albuterol INHALE 2 PUFFS INTO THE LUNGS EVERY 6 (SIX) HOURS AS NEEDED FOR WHEEZING OR SHORTNESS OF BREATH.   tiotropium 18 MCG inhalation capsule Commonly known as:  SPIRIVA HANDIHALER Place 1 capsule (18 mcg total) into inhaler and inhale daily at 6 (six) AM.   traMADol 50 MG tablet Commonly known as:  ULTRAM Take 100 mg by mouth 2 (two) times daily.   traZODone 50 MG tablet Commonly known as:  DESYREL Take 50 mg by mouth at bedtime.   vitamin B-12 1000 MCG tablet Commonly known as:  CYANOCOBALAMIN Take 1,000 mcg by mouth daily.   vitamin C 500 MG tablet Commonly known as:  ASCORBIC ACID Take 500 mg by mouth 2 (two) times daily.   Vitamin D 1000 units capsule Take 1,000 Units by mouth every evening.       Allergies  Allergen Reactions  . Aspirin Other (See Comments)     cns bleed risk  .  Codeine Anaphylaxis and Rash  . Other Other (See Comments)    Stiolto - severe reaction - caused inability to breath    Consultations:  None   Procedures/Studies: Dg Chest 2 View  Result Date: 05/29/2018 CLINICAL DATA:  Cough EXAM: CHEST - 2 VIEW COMPARISON:  05/28/2018 FINDINGS: Artifact from calcified breast implants. Normal heart size.  Coronary stent is noted. Chronic hyperinflation and interstitial coarsening. There is chronic blunting of posterior costophrenic sulci. There is no edema, consolidation, effusion, or pneumothorax. Remote left rib fractures. Remote compression fractures and cement augmentation. IMPRESSION: COPD without acute superimposed finding. Electronically Signed   By: Monte Fantasia M.D.   On: 05/29/2018 09:22   Dg Chest 2 View  Result Date: 05/28/2018 CLINICAL DATA:  Shortness of breath. EXAM: CHEST - 2 VIEW COMPARISON:  03/22/2018 FINDINGS: Normal heart size. No pleural effusion or edema identified. No airspace opacities identified. Bilateral breast implants with capsular calcification noted. The  bones are diffusely osteopenic. Multiple thoracic vertebral compression deformities are identified which have been treated with bone cement. IMPRESSION: No active cardiopulmonary disease. Electronically Signed   By: Kerby Moors M.D.   On: 05/28/2018 11:43   Ct Angio Chest Pe W Or Wo Contrast  Result Date: 05/29/2018 CLINICAL DATA:  Shortness of breath and fever.  Chronic COPD EXAM: CT ANGIOGRAPHY CHEST WITH CONTRAST TECHNIQUE: Multidetector CT imaging of the chest was performed using the standard protocol during bolus administration of intravenous contrast. Multiplanar CT image reconstructions and MIPs were obtained to evaluate the vascular anatomy. CONTRAST:  125mL ISOVUE-370 IOPAMIDOL (ISOVUE-370) INJECTION 76% COMPARISON:  CT angiogram chest December 25, 2016; chest radiograph May 29, 2018 FINDINGS: Cardiovascular: There is no demonstrable pulmonary embolus. There is no thoracic aortic aneurysm or dissection. The visualized great vessels appear unremarkable. There are foci of aortic atherosclerosis. There are foci of coronary artery calcification. There is no pericardial effusion or pericardial thickening. There is stable appearing prominence of the main pulmonary arteries with rather rapid peripheral tapering. Mediastinum/Nodes: There are stable benign-appearing calcifications in the thyroid. No dominant thyroid mass evident. There is no appreciable thoracic adenopathy. No esophageal lesions are demonstrable. Lungs/Pleura: There is diffuse centrilobular type emphysematous change with scattered upper lobe bullous changes. There is atelectatic change in the lung bases, more pronounced on the left than on the right. There is no frank edema or consolidation. There is a degree of lower lobe bronchiectatic change, slightly more pronounced than on prior study. No pleural effusion evident. There is mild posterior basilar benign appearing pleural thickening. Upper Abdomen: There is aortic atherosclerosis in the  upper abdominal region. Visualized upper abdominal structures otherwise appear unremarkable. Musculoskeletal: There are breast implants bilaterally, with peripheral calcification bilaterally. Patient is undergone multiple kyphoplasty procedures throughout the thoracic spine. The bones are osteoporotic. There are no destructive appearing bone lesions. No chest wall lesions are evident. Review of the MIP images confirms the above findings. IMPRESSION: 1. No demonstrable pulmonary embolus. No thoracic aortic aneurysm or dissection. There is aortic atherosclerosis as well as foci of coronary artery calcification. 2. Central pulmonary arterial prominence may indicate a degree of pulmonary arterial hypertension. This finding is similar to prior CT examination. 3. Underlying emphysematous change. Atelectatic change in the bases, more on the left than on the right. Benign bibasilar pleural thickening. There is a degree of lower lobe bronchiectatic change. No frank consolidation or edema. 4.  No appreciable thoracic adenopathy. 5. Bones osteoporotic. Status post multiple kyphoplasty procedures, also noted previously. Aortic Atherosclerosis (ICD10-I70.0) and  Emphysema (ICD10-J43.9). Electronically Signed   By: Lowella Grip III M.D.   On: 05/29/2018 14:53   Vas [place] Lower Extremity Venous (dvt)  Result Date: 05/29/2018  Lower Venous Study Indications: Edema, and elevated D-dimer.  Limitations: Body habitus and immobility. Comparison Study: Lower extremity venous duplex exam on 03/16/2018 Performing Technologist: Rudell Cobb  Examination Guidelines: A complete evaluation includes B-mode imaging, spectral Doppler, color Doppler, and power Doppler as needed of all accessible portions of each vessel. Bilateral testing is considered an integral part of a complete examination. Limited examinations for reoccurring indications may be performed as noted.  Right Venous Findings:  +---------+---------------+---------+-----------+----------+-------------------+          CompressibilityPhasicitySpontaneityPropertiesSummary             +---------+---------------+---------+-----------+----------+-------------------+ CFV      Full           Yes      Yes                                      +---------+---------------+---------+-----------+----------+-------------------+ SFJ      Full                                                             +---------+---------------+---------+-----------+----------+-------------------+ FV Prox  Full                                                             +---------+---------------+---------+-----------+----------+-------------------+ FV Mid   Full                                                             +---------+---------------+---------+-----------+----------+-------------------+ FV DistalFull                                         dilated segment     +---------+---------------+---------+-----------+----------+-------------------+ PFV      Full                                                             +---------+---------------+---------+-----------+----------+-------------------+ POP      Full           Yes      Yes                                      +---------+---------------+---------+-----------+----------+-------------------+ PTV      Full                    Yes  patent, some                                                              segments are not                                                          well visualized                                                           with compression    +---------+---------------+---------+-----------+----------+-------------------+ PERO     Full                                         some segments are                                                         not well visualized                                                        with compression    +---------+---------------+---------+-----------+----------+-------------------+  Left Venous Findings: +---------+---------------+---------+-----------+----------+-------------------+          CompressibilityPhasicitySpontaneityPropertiesSummary             +---------+---------------+---------+-----------+----------+-------------------+ CFV      Full           Yes      Yes                                      +---------+---------------+---------+-----------+----------+-------------------+ SFJ      Full                                                             +---------+---------------+---------+-----------+----------+-------------------+ FV Prox  Full                                                             +---------+---------------+---------+-----------+----------+-------------------+ FV Mid   Full                                                             +---------+---------------+---------+-----------+----------+-------------------+  FV DistalFull                                         poor flow           +---------+---------------+---------+-----------+----------+-------------------+ PFV      Full                                                             +---------+---------------+---------+-----------+----------+-------------------+ POP      Full           Yes      Yes                                      +---------+---------------+---------+-----------+----------+-------------------+ PTV                              Yes                  patent, some                                                              segments are not                                                          well visualized                                                           with compression     +---------+---------------+---------+-----------+----------+-------------------+ PERO                                                  some segments are                                                         not well visualized                                                       with compression    +---------+---------------+---------+-----------+----------+-------------------+    Summary: Right:  There is no evidence of deep vein thrombosis in the lower extremity. However, portions of this examination were limited- see technologist comments above. No cystic structure found in the popliteal fossa. Left: There is no evidence of deep vein thrombosis in the lower extremity. However, portions of this examination were limited- see technologist comments above. No cystic structure found in the popliteal fossa.  *See table(s) above for measurements and observations. Electronically signed by Monica Martinez MD on 05/29/2018 at 2:05:37 PM.    Final       Subjective: Breathing better. Ready to go home. Complaining about breakfast, muffin.   Discharge Exam: Vitals:   05/29/18 1900 05/30/18 0558  BP: 126/67 (!) 153/87  Pulse: 88 70  Resp: 12 12  Temp: 98 F (36.7 C) 98.2 F (36.8 C)  SpO2: 98% 100%   Vitals:   05/29/18 0804 05/29/18 1519 05/29/18 1900 05/30/18 0558  BP:  118/75 126/67 (!) 153/87  Pulse:  84 88 70  Resp:  14 12 12   Temp:  97.9 F (36.6 C) 98 F (36.7 C) 98.2 F (36.8 C)  TempSrc:  Oral Oral Oral  SpO2: 98% 99% 98% 100%  Weight:      Height:        General: NAD, 2 L oxygen Cardiovascular: RRR, S1/S2 +, no rubs, no gallops Respiratory: CTA bilaterally, no wheezing, no rhonchi Abdominal: Soft, NT, ND, bowel sounds + Extremities: no edema, no cyanosis    The results of significant diagnostics from this hospitalization (including imaging, microbiology, ancillary and laboratory) are listed below for reference.     Microbiology: Recent Results (from  the past 240 hour(s))  Culture, blood (Routine x 2)     Status: None (Preliminary result)   Collection Time: 05/28/18 11:18 AM  Result Value Ref Range Status   Specimen Description   Final    BLOOD RIGHT ANTECUBITAL Performed at [facility] [address], [place], [place] [address]    Special Requests   Final    BOTTLES DRAWN AEROBIC AND ANAEROBIC Blood Culture adequate volume Performed at [facility] [address] [place], [place] [address]    Culture   Final    NO GROWTH 2 DAYS Performed at [facility] [address], [place], [place] [address]    Report Status PENDING  Incomplete  Culture, blood (Routine x 2)     Status: None (Preliminary result)   Collection Time: 05/28/18 11:23 AM  Result Value Ref Range Status   Specimen Description   Final    BLOOD RIGHT HAND Performed at [facility] [address], [place], [place] [address]    Special Requests   Final    BOTTLES DRAWN AEROBIC AND ANAEROBIC Blood Culture adequate volume Performed at [facility] [address] [place], [place] [address]    Culture   Final    NO GROWTH 2 DAYS Performed at [facility] [address], [place],  [address]    Report Status PENDING  Incomplete     Labs: BNP (last 3 results) No results for input(s): BNP in the last 8760 hours. Basic Metabolic Panel: Recent Labs  Lab 05/28/18 1118 05/29/18 0557  NA 142 140  K 3.8 4.2  CL 96* 101  CO2 37* 30  GLUCOSE 86 113*  BUN 15 19  CREATININE 0.89 0.73  CALCIUM 9.0 8.0*   Liver Function Tests: Recent Labs  Lab 05/28/18 1118  AST 14*  ALT 10  ALKPHOS 62  BILITOT 1.2  PROT 5.9*  ALBUMIN 3.3*   No results for input(s): LIPASE, AMYLASE in the last 168 hours. No results for input(s): AMMONIA in the last 168 hours. CBC: Recent Labs  Lab 05/28/18 1118 05/29/18 0557  WBC 14.1* 9.5  NEUTROABS 11.0* 8.5*  HGB 13.2 11.1*  HCT  43.1 36.3  MCV 91.5 89.6  PLT 205 188   Cardiac Enzymes: No results for input(s): CKTOTAL, CKMB, CKMBINDEX, TROPONINI in the last 168 hours. BNP: Invalid input(s): POCBNP CBG: No results for input(s): GLUCAP in the last 168 hours. D-Dimer Recent Labs    05/28/18 1251  DDIMER 1.34*   Hgb A1c No results for input(s): HGBA1C in the last 72 hours. Lipid Profile No results for input(s): CHOL, HDL, LDLCALC, TRIG, CHOLHDL, LDLDIRECT in the last 72 hours. Thyroid function studies No results for input(s): TSH, T4TOTAL, T3FREE, THYROIDAB in the last 72 hours.  Invalid input(s): FREET3 Anemia work up No results for input(s): VITAMINB12, FOLATE, FERRITIN, TIBC, IRON, RETICCTPCT in the last 72 hours. Urinalysis    Component Value Date/Time   COLORURINE YELLOW 02/13/2018 1408   APPEARANCEUR CLEAR 02/13/2018 1408   LABSPEC 1.010 02/13/2018 1408   PHURINE 6.5 02/13/2018 1408   GLUCOSEU NEGATIVE 02/13/2018 1408   HGBUR NEGATIVE 02/13/2018 1408   BILIRUBINUR NEGATIVE 02/13/2018 1408   KETONESUR NEGATIVE 02/13/2018 1408   PROTEINUR NEGATIVE 11/13/2016 1120   UROBILINOGEN 0.2 02/13/2018 1408   NITRITE NEGATIVE 02/13/2018 1408   LEUKOCYTESUR NEGATIVE 02/13/2018 1408   Sepsis Labs Invalid input(s): PROCALCITONIN,  WBC,  LACTICIDVEN Microbiology Recent Results (from the past 240 hour(s))  Culture, blood (Routine x 2)     Status: None (Preliminary result)   Collection Time: 05/28/18 11:18 AM  Result Value Ref Range Status   Specimen Description   Final    BLOOD RIGHT ANTECUBITAL Performed at [facility], [place] [address], [place], [place] [address]    Special Requests   Final    BOTTLES DRAWN AEROBIC AND ANAEROBIC Blood Culture adequate volume Performed at [facility] [address], [place], [place] [address]    Culture   Final    NO GROWTH 2 DAYS Performed at [facility] [address], [place], [place] [address]    Report Status  PENDING  Incomplete  Culture, blood (Routine x 2)     Status: None (Preliminary result)   Collection Time: 05/28/18 11:23 AM  Result Value Ref Range Status   Specimen Description   Final    BLOOD RIGHT HAND Performed at [facility] [address], [place], [place] [address]    Special Requests   Final    BOTTLES DRAWN AEROBIC AND ANAEROBIC Blood Culture adequate volume Performed at [facility] [address], [place], [place] [address]    Culture   Final    NO GROWTH 2 DAYS Performed at [facility] [address], [place], [place] [address]    Report Status PENDING  Incomplete     Time coordinating discharge: 35 minutes  SIGNED:   Elmarie Shiley, MD  [facility] 05/30/2018, 9:57 AM Pager   If 7PM-7AM, please contact night-coverage [email or web address] Password [email or web address]

## 2018-05-30 NOTE — Progress Notes (Signed)
Patient given discharge instructions, and verbalized an understanding of all discharge instructions.  Patient agrees with discharge plan, and is being discharged in stable medical condition.  Patient given transportation via wheelchair. 

## 2018-06-01 ENCOUNTER — Telehealth: Payer: Self-pay | Admitting: *Deleted

## 2018-06-01 NOTE — Telephone Encounter (Signed)
Transition Care Management Follow-up Telephone Call   Date discharged? 05/30/18  How have you been since you were released from the hospital? Spoke w/husband he states wife is doing alright. Currently taking prednisone pack that was rx'd...seem to be better. Husband declined f/u w/PCP. He states hospital inform them to see pulmonology since she was admitted for her COPD. Pt has appt w/Dr. Lamonte Sakai 06/13/18...[name]

## 2018-06-02 LAB — CULTURE, BLOOD (ROUTINE X 2)
CULTURE: NO GROWTH
Culture: NO GROWTH
SPECIAL REQUESTS: ADEQUATE
Special Requests: ADEQUATE

## 2018-06-05 ENCOUNTER — Encounter: Payer: Self-pay | Admitting: Surgery

## 2018-06-05 ENCOUNTER — Encounter: Payer: Medicare Other | Admitting: Surgery

## 2018-06-05 ENCOUNTER — Inpatient Hospital Stay (HOSPITAL_COMMUNITY): Admission: RE | Admit: 2018-06-05 | Payer: Medicare Other | Source: Ambulatory Visit

## 2018-06-06 ENCOUNTER — Other Ambulatory Visit: Payer: Self-pay | Admitting: Cardiovascular Disease

## 2018-06-06 ENCOUNTER — Ambulatory Visit: Payer: Medicare Other | Admitting: Podiatry

## 2018-06-07 ENCOUNTER — Emergency Department (HOSPITAL_COMMUNITY): Payer: Medicare Other

## 2018-06-07 ENCOUNTER — Telehealth: Payer: Self-pay | Admitting: Emergency Medicine

## 2018-06-07 ENCOUNTER — Encounter (HOSPITAL_COMMUNITY): Payer: Self-pay | Admitting: Emergency Medicine

## 2018-06-07 ENCOUNTER — Other Ambulatory Visit: Payer: Self-pay

## 2018-06-07 ENCOUNTER — Inpatient Hospital Stay (HOSPITAL_COMMUNITY)
Admission: EM | Admit: 2018-06-07 | Discharge: 2018-06-13 | DRG: 190 | Disposition: A | Discharge status: Skilled Nursing Facility | Payer: Medicare Other | Attending: Family Medicine | Admitting: Family Medicine

## 2018-06-07 DIAGNOSIS — Z7951 Long term (current) use of inhaled steroids: Secondary | ICD-10-CM

## 2018-06-07 DIAGNOSIS — H04123 Dry eye syndrome of bilateral lacrimal glands: Secondary | ICD-10-CM | POA: Diagnosis not present

## 2018-06-07 DIAGNOSIS — R7989 Other specified abnormal findings of blood chemistry: Secondary | ICD-10-CM

## 2018-06-07 DIAGNOSIS — I872 Venous insufficiency (chronic) (peripheral): Secondary | ICD-10-CM | POA: Diagnosis not present

## 2018-06-07 DIAGNOSIS — E785 Hyperlipidemia, unspecified: Secondary | ICD-10-CM | POA: Diagnosis not present

## 2018-06-07 DIAGNOSIS — Z886 Allergy status to analgesic agent status: Secondary | ICD-10-CM | POA: Diagnosis not present

## 2018-06-07 DIAGNOSIS — J439 Emphysema, unspecified: Secondary | ICD-10-CM | POA: Diagnosis present

## 2018-06-07 DIAGNOSIS — Z7952 Long term (current) use of systemic steroids: Secondary | ICD-10-CM | POA: Diagnosis not present

## 2018-06-07 DIAGNOSIS — J449 Chronic obstructive pulmonary disease, unspecified: Secondary | ICD-10-CM | POA: Diagnosis not present

## 2018-06-07 DIAGNOSIS — R0902 Hypoxemia: Secondary | ICD-10-CM | POA: Diagnosis not present

## 2018-06-07 DIAGNOSIS — J441 Chronic obstructive pulmonary disease with (acute) exacerbation: Secondary | ICD-10-CM

## 2018-06-07 DIAGNOSIS — Z79899 Other long term (current) drug therapy: Secondary | ICD-10-CM

## 2018-06-07 DIAGNOSIS — M810 Age-related osteoporosis without current pathological fracture: Secondary | ICD-10-CM | POA: Diagnosis not present

## 2018-06-07 DIAGNOSIS — Z8262 Family history of osteoporosis: Secondary | ICD-10-CM | POA: Diagnosis not present

## 2018-06-07 DIAGNOSIS — Z8719 Personal history of other diseases of the digestive system: Secondary | ICD-10-CM

## 2018-06-07 DIAGNOSIS — M25572 Pain in left ankle and joints of left foot: Secondary | ICD-10-CM | POA: Diagnosis not present

## 2018-06-07 DIAGNOSIS — Z9981 Dependence on supplemental oxygen: Secondary | ICD-10-CM

## 2018-06-07 DIAGNOSIS — M21371 Foot drop, right foot: Secondary | ICD-10-CM | POA: Diagnosis not present

## 2018-06-07 DIAGNOSIS — I10 Essential (primary) hypertension: Secondary | ICD-10-CM | POA: Diagnosis present

## 2018-06-07 DIAGNOSIS — F329 Major depressive disorder, single episode, unspecified: Secondary | ICD-10-CM | POA: Diagnosis present

## 2018-06-07 DIAGNOSIS — Z955 Presence of coronary angioplasty implant and graft: Secondary | ICD-10-CM

## 2018-06-07 DIAGNOSIS — K5900 Constipation, unspecified: Secondary | ICD-10-CM | POA: Diagnosis not present

## 2018-06-07 DIAGNOSIS — R52 Pain, unspecified: Secondary | ICD-10-CM | POA: Diagnosis not present

## 2018-06-07 DIAGNOSIS — X58XXXA Exposure to other specified factors, initial encounter: Secondary | ICD-10-CM | POA: Diagnosis present

## 2018-06-07 DIAGNOSIS — I2510 Atherosclerotic heart disease of native coronary artery without angina pectoris: Secondary | ICD-10-CM | POA: Diagnosis present

## 2018-06-07 DIAGNOSIS — F419 Anxiety disorder, unspecified: Secondary | ICD-10-CM | POA: Diagnosis present

## 2018-06-07 DIAGNOSIS — I110 Hypertensive heart disease with heart failure: Secondary | ICD-10-CM | POA: Diagnosis present

## 2018-06-07 DIAGNOSIS — J9691 Respiratory failure, unspecified with hypoxia: Secondary | ICD-10-CM | POA: Diagnosis not present

## 2018-06-07 DIAGNOSIS — J9621 Acute and chronic respiratory failure with hypoxia: Secondary | ICD-10-CM | POA: Diagnosis present

## 2018-06-07 DIAGNOSIS — M4848XA Fatigue fracture of vertebra, sacral and sacrococcygeal region, initial encounter for fracture: Secondary | ICD-10-CM | POA: Diagnosis not present

## 2018-06-07 DIAGNOSIS — Z8673 Personal history of transient ischemic attack (TIA), and cerebral infarction without residual deficits: Secondary | ICD-10-CM | POA: Diagnosis not present

## 2018-06-07 DIAGNOSIS — Z7902 Long term (current) use of antithrombotics/antiplatelets: Secondary | ICD-10-CM

## 2018-06-07 DIAGNOSIS — R079 Chest pain, unspecified: Secondary | ICD-10-CM | POA: Diagnosis not present

## 2018-06-07 DIAGNOSIS — I5033 Acute on chronic diastolic (congestive) heart failure: Secondary | ICD-10-CM | POA: Diagnosis not present

## 2018-06-07 DIAGNOSIS — R279 Unspecified lack of coordination: Secondary | ICD-10-CM | POA: Diagnosis not present

## 2018-06-07 DIAGNOSIS — I248 Other forms of acute ischemic heart disease: Secondary | ICD-10-CM | POA: Diagnosis not present

## 2018-06-07 DIAGNOSIS — R112 Nausea with vomiting, unspecified: Secondary | ICD-10-CM | POA: Diagnosis not present

## 2018-06-07 DIAGNOSIS — R062 Wheezing: Secondary | ICD-10-CM | POA: Diagnosis not present

## 2018-06-07 DIAGNOSIS — M25551 Pain in right hip: Secondary | ICD-10-CM | POA: Diagnosis present

## 2018-06-07 DIAGNOSIS — Z8249 Family history of ischemic heart disease and other diseases of the circulatory system: Secondary | ICD-10-CM

## 2018-06-07 DIAGNOSIS — Z87891 Personal history of nicotine dependence: Secondary | ICD-10-CM

## 2018-06-07 DIAGNOSIS — M84359A Stress fracture, hip, unspecified, initial encounter for fracture: Secondary | ICD-10-CM | POA: Diagnosis not present

## 2018-06-07 DIAGNOSIS — I5031 Acute diastolic (congestive) heart failure: Secondary | ICD-10-CM | POA: Diagnosis not present

## 2018-06-07 DIAGNOSIS — Z743 Need for continuous supervision: Secondary | ICD-10-CM | POA: Diagnosis not present

## 2018-06-07 DIAGNOSIS — J9601 Acute respiratory failure with hypoxia: Secondary | ICD-10-CM

## 2018-06-07 DIAGNOSIS — R600 Localized edema: Secondary | ICD-10-CM | POA: Diagnosis not present

## 2018-06-07 DIAGNOSIS — Z885 Allergy status to narcotic agent status: Secondary | ICD-10-CM | POA: Diagnosis not present

## 2018-06-07 DIAGNOSIS — R0602 Shortness of breath: Secondary | ICD-10-CM | POA: Diagnosis not present

## 2018-06-07 DIAGNOSIS — I509 Heart failure, unspecified: Secondary | ICD-10-CM | POA: Diagnosis not present

## 2018-06-07 DIAGNOSIS — R778 Other specified abnormalities of plasma proteins: Secondary | ICD-10-CM

## 2018-06-07 LAB — COMPREHENSIVE METABOLIC PANEL
ALT: 16 U/L (ref 0–44)
AST: 17 U/L (ref 15–41)
Albumin: 3.7 g/dL (ref 3.5–5.0)
Alkaline Phosphatase: 58 U/L (ref 38–126)
Anion gap: 7 (ref 5–15)
BUN: 14 mg/dL (ref 8–23)
CO2: 36 mmol/L — ABNORMAL HIGH (ref 22–32)
Calcium: 9 mg/dL (ref 8.9–10.3)
Chloride: 99 mmol/L (ref 98–111)
Creatinine, Ser: 0.95 mg/dL (ref 0.44–1.00)
GFR calc Af Amer: >60 mL/min (ref >60)
GFR calc non Af Amer: 58 mL/min — ABNORMAL LOW (ref >60)
Glucose, Bld: 114 mg/dL — ABNORMAL HIGH (ref 70–99)
Potassium: 4.2 mmol/L (ref 3.5–5.1)
Sodium: 142 mmol/L (ref 135–145)
Total Bilirubin: 1 mg/dL (ref 0.3–1.2)
Total Protein: 6.1 g/dL — ABNORMAL LOW (ref 6.5–8.1)

## 2018-06-07 LAB — CBC
HCT: 44.7 % (ref 36.0–46.0)
Hemoglobin: 13.6 g/dL (ref 12.0–15.0)
MCH: 27.9 pg (ref 26.0–34.0)
MCHC: 30.4 g/dL (ref 30.0–36.0)
MCV: 91.8 fL (ref 80.0–100.0)
Platelets: 244 10*3/uL (ref 150–400)
RBC: 4.87 MIL/uL (ref 3.87–5.11)
RDW: 13.1 % (ref 11.5–15.5)
WBC: 18 10*3/uL — ABNORMAL HIGH (ref 4.0–10.5)
nRBC: 0 % (ref 0.0–0.2)

## 2018-06-07 LAB — TROPONIN I
Troponin I: 0.17 ng/mL (ref <0.03)
Troponin I: 0.21 ng/mL (ref <0.03)

## 2018-06-07 LAB — BRAIN NATRIURETIC PEPTIDE: B NATRIURETIC PEPTIDE 5: 118.6 pg/mL — AB (ref 0.0–100.0)

## 2018-06-07 MED ORDER — ALBUTEROL SULFATE (2.5 MG/3ML) 0.083% IN NEBU: 2.5000 mg | INHALATION_SOLUTION | RESPIRATORY_TRACT | Status: DC | PRN

## 2018-06-07 MED ORDER — CLOPIDOGREL BISULFATE 75 MG PO TABS
75.0000 mg | ORAL_TABLET | Freq: Every day | ORAL | Status: DC
  Administered 2018-06-08 – 2018-06-13 (×6): 75 mg via ORAL
  Filled 2018-06-07 (×7): qty 1

## 2018-06-07 MED ORDER — ACETAMINOPHEN 325 MG PO TABS
650.0000 mg | ORAL_TABLET | Freq: Once | ORAL | Status: AC
  Administered 2018-06-07: 650 mg via ORAL
  Filled 2018-06-07: qty 2

## 2018-06-07 MED ORDER — POTASSIUM CHLORIDE CRYS ER 10 MEQ PO TBCR
10.0000 meq | EXTENDED_RELEASE_TABLET | Freq: Every day | ORAL | Status: DC
  Administered 2018-06-08 – 2018-06-13 (×6): 10 meq via ORAL
  Filled 2018-06-07 (×6): qty 1

## 2018-06-07 MED ORDER — ACETAMINOPHEN 325 MG PO TABS: 650.0000 mg | ORAL_TABLET | Freq: Four times a day (QID) | ORAL | Status: DC | PRN

## 2018-06-07 MED ORDER — ISOSORBIDE MONONITRATE ER 30 MG PO TB24
15.0000 mg | ORAL_TABLET | Freq: Every day | ORAL | Status: DC
  Administered 2018-06-08 – 2018-06-13 (×7): 15 mg via ORAL
  Filled 2018-06-07 (×7): qty 1

## 2018-06-07 MED ORDER — METHYLPREDNISOLONE SODIUM SUCC 40 MG IJ SOLR
40.0000 mg | INTRAMUSCULAR | Status: DC
  Administered 2018-06-08: 40 mg via INTRAVENOUS
  Filled 2018-06-07: qty 1

## 2018-06-07 MED ORDER — TIOTROPIUM BROMIDE MONOHYDRATE 18 MCG IN CAPS: 18.0000 ug | ORAL_CAPSULE | Freq: Every day | RESPIRATORY_TRACT | Status: DC

## 2018-06-07 MED ORDER — PRAVASTATIN SODIUM 20 MG PO TABS
20.0000 mg | ORAL_TABLET | Freq: Every day | ORAL | Status: DC
  Administered 2018-06-07 – 2018-06-12 (×5): 20 mg via ORAL
  Filled 2018-06-07 (×5): qty 1

## 2018-06-07 MED ORDER — DOCUSATE SODIUM 100 MG PO CAPS
100.0000 mg | ORAL_CAPSULE | Freq: Every evening | ORAL | Status: DC
  Administered 2018-06-08 – 2018-06-12 (×5): 100 mg via ORAL
  Filled 2018-06-07 (×6): qty 1

## 2018-06-07 MED ORDER — HYDROXYZINE HCL 25 MG PO TABS
25.0000 mg | ORAL_TABLET | Freq: Two times a day (BID) | ORAL | Status: DC
  Administered 2018-06-07 – 2018-06-13 (×9): 25 mg via ORAL
  Filled 2018-06-07 (×11): qty 1

## 2018-06-07 MED ORDER — ACETAMINOPHEN 650 MG RE SUPP: 650.0000 mg | Freq: Four times a day (QID) | RECTAL | Status: DC | PRN

## 2018-06-07 MED ORDER — VITAMIN B-12 1000 MCG PO TABS
1000.0000 ug | ORAL_TABLET | Freq: Every evening | ORAL | Status: DC
  Administered 2018-06-08 – 2018-06-12 (×5): 1000 ug via ORAL
  Filled 2018-06-07 (×6): qty 1

## 2018-06-07 MED ORDER — ONDANSETRON HCL 4 MG/2ML IJ SOLN
4.0000 mg | Freq: Four times a day (QID) | INTRAMUSCULAR | Status: DC | PRN
  Filled 2018-06-07: qty 2

## 2018-06-07 MED ORDER — ENOXAPARIN SODIUM 40 MG/0.4ML ~~LOC~~ SOLN: 40.0000 mg | SUBCUTANEOUS | Status: DC

## 2018-06-07 MED ORDER — NITROGLYCERIN 0.4 MG SL SUBL: 0.4000 mg | SUBLINGUAL_TABLET | SUBLINGUAL | Status: DC | PRN

## 2018-06-07 MED ORDER — ALBUTEROL SULFATE (2.5 MG/3ML) 0.083% IN NEBU
2.5000 mg | INHALATION_SOLUTION | RESPIRATORY_TRACT | Status: DC
  Administered 2018-06-07: 2.5 mg via RESPIRATORY_TRACT
  Filled 2018-06-07: qty 3

## 2018-06-07 MED ORDER — ONDANSETRON HCL 4 MG PO TABS: 4.0000 mg | ORAL_TABLET | Freq: Four times a day (QID) | ORAL | Status: DC | PRN

## 2018-06-07 MED ORDER — POLYETHYLENE GLYCOL 3350 17 G PO PACK: 17.0000 g | PACK | Freq: Every day | ORAL | Status: DC | PRN

## 2018-06-07 MED ORDER — METOPROLOL TARTRATE 25 MG PO TABS
25.0000 mg | ORAL_TABLET | Freq: Two times a day (BID) | ORAL | Status: DC
  Administered 2018-06-07 – 2018-06-13 (×12): 25 mg via ORAL
  Filled 2018-06-07 (×12): qty 1

## 2018-06-07 MED ORDER — IPRATROPIUM-ALBUTEROL 0.5-2.5 (3) MG/3ML IN SOLN
3.0000 mL | Freq: Four times a day (QID) | RESPIRATORY_TRACT | Status: DC
  Administered 2018-06-08 (×2): 3 mL via RESPIRATORY_TRACT
  Filled 2018-06-07 (×2): qty 3

## 2018-06-07 MED ORDER — VITAMIN D 25 MCG (1000 UNIT) PO TABS
1000.0000 [IU] | ORAL_TABLET | Freq: Every evening | ORAL | Status: DC
  Administered 2018-06-08 – 2018-06-12 (×5): 1000 [IU] via ORAL
  Filled 2018-06-07 (×6): qty 1

## 2018-06-07 MED ORDER — TRAZODONE HCL 50 MG PO TABS
50.0000 mg | ORAL_TABLET | Freq: Every day | ORAL | Status: DC
  Administered 2018-06-07 – 2018-06-12 (×6): 50 mg via ORAL
  Filled 2018-06-07 (×6): qty 1

## 2018-06-07 MED ORDER — BUDESONIDE 0.25 MG/2ML IN SUSP
0.2500 mg | Freq: Two times a day (BID) | RESPIRATORY_TRACT | Status: DC
  Administered 2018-06-08 – 2018-06-13 (×10): 0.25 mg via RESPIRATORY_TRACT
  Filled 2018-06-07 (×12): qty 2

## 2018-06-07 MED ORDER — METHYLPREDNISOLONE SODIUM SUCC 125 MG IJ SOLR
125.0000 mg | Freq: Once | INTRAMUSCULAR | Status: AC
  Administered 2018-06-07: 125 mg via INTRAVENOUS
  Filled 2018-06-07: qty 2

## 2018-06-07 MED ORDER — IPRATROPIUM-ALBUTEROL 0.5-2.5 (3) MG/3ML IN SOLN
3.0000 mL | Freq: Once | RESPIRATORY_TRACT | Status: AC
  Administered 2018-06-07: 3 mL via RESPIRATORY_TRACT
  Filled 2018-06-07: qty 3

## 2018-06-07 NOTE — ED Notes (Signed)
Date and time results received: 06/07/18 1815 (use smartphrase ".now" to insert current time)  Test: troponin Critical Value: 0.17  Name of Provider Notified: Sophia Caccavale  Orders Received? Or Actions Taken?: Actions Taken: notified Sophia via phone of troponin 0.17.

## 2018-06-07 NOTE — ED Provider Notes (Addendum)
[facility] DEPT Provider Note   CSN: [id number] Arrival date & time: 06/07/18  1425     History   Chief Complaint Chief Complaint  Patient presents with  . Shortness of Breath  . Hip Pain    HPI Alexis Higgins is a 78 y.o. female presenting for evaluation of shortness of breath and right hip pain.  Patient states her right hip began hurting 2 days ago.  It began when she woke up.  She denies fall, trauma, or injury.  She denies history of problems with her right hip.  She denies pain elsewhere.  She used lidocaine patches and Tylenol with complete resolution of her pain, but returned again this morning.  Pain is worse with movement of her leg and palpation.  She denies radiation to her leg.  She denies numbness or tingling.  Patient uses a wheelchair at baseline, standing only for transfers.  She reports increasing weakness over the past several months. Additionally, patient reports worsening shortness of breath.  She was recently admitted for COPD exacerbation, had been improving until her hip pain.  Patient states she was feeling very anxious about her hip pain, which made it harder for her to breathe. She denies recent fevers, chills, cough, chest pain, nausea, vomiting, abdominal pain, urinary symptoms, abnormal bowel movements.  Additional history obtained from chart review, patient with recent COPD admission.  She follows with Dr. Lamonte Sakai from pulmonology.  Additional history of anxiety, bursitis of the right hip, CAD followed by Dr. Angelena Form, hypertension, COPD on 2 L of oxygen.   HPI  Past Medical History:  Diagnosis Date  . ANXIETY 01/01/2007  . Blood transfusion without reported diagnosis 2015  . BURSITIS, RIGHT HIP 06/04/2009  . CAD (coronary artery disease)    a. BMS to LAD 2010. b. NSTEMI with DES to LAD for ISR 2011. c. Patent stent 03/2012/Imdur added.  . Cataract   . Colon polyps    H/o tubular adenoma of colon  . COPD 01/01/2007   a. Chronic resp failure on home O2.  Marland Kitchen DEPRESSION 01/01/2007  . Eczema 01/08/2011  . GROIN PAIN 06/20/2008  . Headache(784.0) 01/01/2007  . Hemorrhoid   . HYPERTENSION 01/01/2007  . Impaired glucose tolerance 01/07/2011  . LOW BACK PAIN 01/01/2007  . Muscle weakness (generalized) 06/04/2009  . On home oxygen therapy    "2L; 24/7" (03/21/2018)  . OSTEOARTHRITIS, HIP 07/01/2008  . OSTEOPOROSIS 01/01/2007  . Pneumonia 1998  . Rosacea 01/08/2011  . SYNCOPE 01/01/2007  . Thoracic compression fracture ([place])   . TRANSIENT ISCHEMIC ATTACK, HX OF 01/01/2007    Patient Active Problem List   Diagnosis Date Noted  . COPD exacerbation ([place]) 05/28/2018  . Lymphedema 03/21/2018  . Left upper quadrant pain 12/21/2017  . 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 Insect bite, sequela 09/21/2017  . Hypokalemia 08/15/2017  . On home O2 07/27/2017  . Itching 07/08/2017  . Skin lesion 07/08/2017  . Ingrown nail 07/08/2017  . Back pain 10/06/2016  . Fracture of seventh thoracic vertebra ([place]) 10/06/2016  . Non-traumatic compression fracture of T7 thoracic vertebra   . Thoracic compression fracture ([place])   . Intractable back pain 09/10/2016  . Closed compression fracture of thoracic vertebra ([place])   . 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 Peripheral edema 06/15/2016  . Smoker 08/02/2014  . Peripheral vascular disease ([place]) 08/02/2014  . Abdominal pain, epigastric 11/22/2013  . Black stools 11/22/2013  . Displaced fracture of left femoral neck (  [place]) 09/30/2013  . Fracture of femoral neck, left, closed ([place]) 09/30/2013  . Fall at home 09/30/2013  . Head contusion 09/30/2013  . Leg weakness, bilateral 07/21/2012  . 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 Unstable angina pectoris ([place]) 04/12/2012  . Chronic respiratory failure ([place]) 02/13/2012  . Localized swelling, mass and lump, neck 01/06/2012  . CAD (coronary artery disease) 12/14/2011  . Dyspnea 11/22/2011  . Personal history of  colonic polyps 09/06/2011  . Eczema 01/08/2011  . Rosacea 01/08/2011  . URI (upper respiratory infection) 01/08/2011  . Dizziness - light-headed 01/08/2011  . Encounter for long-term (current) use of high-risk medication 01/08/2011  . Impaired glucose tolerance 01/07/2011  . Preventative health care 01/07/2011  . BURSITIS, RIGHT HIP 06/04/2009  . CAD, NATIVE VESSEL 01/15/2009  . Other symptoms involving cardiovascular system 01/15/2009  . CHEST PAIN-PRECORDIAL 01/15/2009  . OSTEOARTHRITIS, HIP 07/01/2008  . Hyperlipidemia 01/01/2007  . Anxiety state 01/01/2007  . 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 Essential hypertension 01/01/2007  . 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 LOW BACK PAIN 01/01/2007  . Osteoporosis 01/01/2007  . SYNCOPE 01/01/2007  . Headache(784.0) 01/01/2007  . TRANSIENT ISCHEMIC ATTACK, HX OF 01/01/2007    Past Surgical History:  Procedure Laterality Date  . ABDOMINAL HYSTERECTOMY    . APPENDECTOMY    . BACK SURGERY    . BREAST ENHANCEMENT SURGERY    . COLONOSCOPY  01/25/2002   tubular adenoma,hemorrhoids, hyperplastic  colon polyps  . COLONOSCOPY  02/17/2005   hemorrhoids  . CORONARY ANGIOPLASTY    . CORONARY STENT PLACEMENT    . ESOPHAGOGASTRODUODENOSCOPY (EGD) WITH PROPOFOL N/A 08/10/2017   Procedure: ESOPHAGOGASTRODUODENOSCOPY (EGD) WITH PROPOFOL;  Surgeon: Gatha Mayer, MD;  Location: [facility];  Service: Endoscopy;  Laterality: N/A;  . HIP ARTHROPLASTY Left 10/01/2013   Procedure: ARTHROPLASTY UNIPOLAR   HIP;  Surgeon: Marianna Payment, MD;  Location: [facility];  Service: Orthopedics;  Laterality: Left;  . HIP SURGERY Left    DR XU     PROXIMAL NECK   . IR GENERIC HISTORICAL  02/19/2016   IR VERTEBROPLASTY CERV/THOR BX INC UNI/BIL INC/INJECT/IMAGING 02/19/2016 Luanne Bras, MD [facility]  . IR GENERIC HISTORICAL  07/15/2016   IR KYPHO THORACIC WITH BONE BIOPSY 07/15/2016 Luanne Bras, MD [facility]  . IR KYPHO EA ADDL LEVEL THORACIC OR LUMBAR  09/14/2016  . IR RADIOLOGIST EVAL & MGMT  09/22/2016  . IR VERTEBROPLASTY CERV/THOR BX INC UNI/BIL INC/INJECT/IMAGING  10/08/2016  . IR VERTEBROPLASTY CERV/THOR BX INC UNI/BIL INC/INJECT/IMAGING  11/16/2016  . KYPHOPLASTY  12/2015-10/2016 X 5   "?2lumbar & 3 thoracic"  . LEFT HEART CATHETERIZATION WITH CORONARY ANGIOGRAM N/A 04/13/2012   Procedure: LEFT HEART CATHETERIZATION WITH CORONARY ANGIOGRAM;  Surgeon: Burnell Blanks, MD;  Location: [facility] CATH LAB;  Service: Cardiovascular;  Laterality: N/A;  . OOPHORECTOMY     one ovary  . s/p bilat cataract  2010  . sp lumbar disc surgury     [name]     OB History   No obstetric history on file.      Home Medications    Prior to Admission medications   Medication Sig Start Date End Date Taking? Authorizing Provider  acetaminophen (TYLENOL) 500 MG tablet Take 1,000 mg by mouth every 6 (six) hours as needed for moderate pain or headache.     [provider]  Cholecalciferol (VITAMIN D) 1000 UNITS capsule Take 1,000 Units by mouth every evening.  [provider]  clopidogrel (PLAVIX) 75 MG tablet TAKE 1 TABLET BY MOUTH EVERY DAY 06/06/18   Burnell Blanks, MD  diclofenac sodium (VOLTAREN) 1 % GEL APPLY 4 GRAMS TOPICALLY 4 TIMES DAILY Patient taking differently: Apply 4 g topically 4 (four) times daily.  04/04/18   Biagio Borg, MD  docusate sodium (COLACE) 100 MG capsule Take 100 mg by mouth daily as needed for mild constipation.     [provider]  furosemide (LASIX) 20 MG tablet Take 1 tablet (20 mg total) by mouth 2 (two) times daily as needed. 03/11/17   Biagio Borg, MD  hydrOXYzine (ATARAX/VISTARIL) 25 MG tablet TAKE 1 OR 2 TABLETS BY MOUTH EVERY 8 HOURS AS NEEDED FOR ANXIETY Patient taking differently: Take 25 mg by mouth 3 (three) times daily. Taking one tablet daily 02/28/18   Biagio Borg, MD  isosorbide mononitrate (IMDUR) 30 MG 24 hr  tablet TAKE 0.5 TABLETS (15 MG TOTAL) BY MOUTH DAILY. Patient taking differently: Take 15 mg by mouth daily. TAKE 0.5 TABLETS (15 MG TOTAL) BY MOUTH DAILY. 04/11/18   Burnell Blanks, MD  lovastatin (MEVACOR) 20 MG tablet TAKE 20 MG BY MOUTH AT BEDTIME Patient taking differently: Take 20 mg by mouth at bedtime.  02/15/18   Biagio Borg, MD  metoprolol tartrate (LOPRESSOR) 25 MG tablet TAKE 1 TABLET BY MOUTH TWICE A DAY NEEDS APPT Patient taking differently: Take 25 mg by mouth 2 (two) times daily.  03/16/18   Burnell Blanks, MD  nitroGLYCERIN (NITROSTAT) 0.4 MG SL tablet Place 1 tablet (0.4 mg total) under the tongue every 5 (five) minutes as needed for chest pain (up to 3 doses). 04/13/12   Dunn, Nedra Hai, PA-C  ondansetron (ZOFRAN) 4 MG tablet Take 1 tablet (4 mg total) by mouth every 8 (eight) hours as needed for nausea or vomiting. 01/06/16   Mackuen, Courteney Lyn, MD  OXYGEN Inhale 2 L into the lungs continuous.     [provider]  polyethylene glycol (MIRALAX / GLYCOLAX) packet Take 17 g by mouth daily as needed for mild constipation.    [provider]  polyvinyl alcohol (LIQUIFILM TEARS) 1.4 % ophthalmic solution Place 1 drop into both eyes daily as needed for dry eyes.     [provider]  potassium chloride (K-DUR) 10 MEQ tablet Take 1 tablet (10 mEq total) by mouth daily. 08/15/17   Biagio Borg, MD  predniSONE (DELTASONE) 10 MG tablet Take 4 tablets for 2 days then 3 tablets for 3 days then resume prior home dose of 15 mg daily. 05/30/18   Regalado, Belkys A, MD  PROAIR HFA 108 (90 Base) MCG/ACT inhaler INHALE 2 PUFFS INTO THE LUNGS EVERY 6 (SIX) HOURS AS NEEDED FOR WHEEZING OR SHORTNESS OF BREATH. 05/09/18   Biagio Borg, MD  tiotropium (SPIRIVA HANDIHALER) 18 MCG inhalation capsule Place 1 capsule (18 mcg total) into inhaler and inhale daily at 6 (six) AM. 04/11/18   Biagio Borg, MD  traMADol (ULTRAM) 50 MG tablet Take 100 mg by mouth 2 (two)  times daily.     [provider]  traZODone (DESYREL) 50 MG tablet Take 50 mg by mouth at bedtime.    [provider]  vitamin B-12 (CYANOCOBALAMIN) 1000 MCG tablet Take 1,000 mcg by mouth daily.    [provider]  vitamin C (ASCORBIC ACID) 500 MG tablet Take 500 mg by mouth 2 (two) times daily.    [provider]    Family History Family History  Problem Relation Age of Onset  . Osteoporosis Mother   . Heart disease Sister   . Emphysema Sister   . Seizures Sister        epilepsy  . Cardiomyopathy Sister   . Heart attack Sister   . Colon cancer Neg Hx   . Colon polyps Neg Hx   . Esophageal cancer Neg Hx   . Rectal cancer Neg Hx   . Stomach cancer Neg Hx     Social History Social History   Tobacco Use  . Smoking status: Former Smoker    Packs/day: 1.50    Years: 51.00    Pack years: 76.50    Types: Cigarettes    Last attempt to quit: 07/24/2005    Years since quitting: 12.8  . Smokeless tobacco: Never Used  Substance Use Topics  . Alcohol use: No    Alcohol/week: 0.0 standard drinks  . Drug use: No     Allergies   Aspirin; Codeine; and Other   Review of Systems Review of Systems  Respiratory: Positive for shortness of breath.   Musculoskeletal: Positive for arthralgias.  Psychiatric/Behavioral: The patient is nervous/anxious.   All other systems reviewed and are negative.    Physical Exam Updated Vital Signs BP (!) 150/94 (BP Location: Left Arm)   Pulse 79   Temp 99.1 F (37.3 C) (Oral)   Resp 14   Ht 5\' 7"  (1.702 m)   Wt 55.3 kg   SpO2 100%   BMI 19.11 kg/m   Physical Exam Vitals signs and nursing note reviewed.  Constitutional:      General: She is not in acute distress.    Appearance: She is well-developed.     Comments: Chronically ill-appearing female in no acute distress  HENT:     Head: Normocephalic and atraumatic.  Eyes:     Conjunctiva/sclera: Conjunctivae normal.     Pupils: Pupils are equal,  round, and reactive to light.  Neck:     Musculoskeletal: Normal range of motion and neck supple.  Cardiovascular:     Rate and Rhythm: Normal rate and regular rhythm.     Pulses: Normal pulses.  Pulmonary:     Effort: Pulmonary effort is normal. No respiratory distress.     Breath sounds: Normal breath sounds. No wheezing.     Comments: On O2 via [place]. In no respiratory distress. Minimal air movement in all fields. Speaking in full sentences.  Abdominal:     General: Bowel sounds are normal. There is no distension.     Palpations: Abdomen is soft.     Tenderness: There is no abdominal tenderness.  Musculoskeletal: Normal range of motion.        General: Tenderness present.     Left lower leg: Edema present.     Comments: Left leg swollen with 1+ pitting edema.  Pedal pulses intact bilaterally. Tenderness palpation of right posterior, lateral, and anterior hip.  No obvious deformity, erythema, or lesions.  No contusions or signs of injury.  Increased pain with range of motion.  Skin:    General: Skin is warm and dry.     Capillary Refill: Capillary refill takes less than 2 seconds.  Neurological:     Mental Status: She is alert and oriented to person, place, and time.      ED Treatments / Results  Labs (all labs ordered are listed, but only abnormal results are displayed) Labs Reviewed  CBC  COMPREHENSIVE METABOLIC PANEL  TROPONIN I  BRAIN NATRIURETIC PEPTIDE    EKG None  Radiology No results found.  Procedures .Critical Care Performed by: Franchot Heidelberg, PA-C Authorized by: Franchot Heidelberg, PA-C   Critical care provider statement:    Critical care time (minutes):  30   Critical care time was exclusive of:  Separately billable procedures and treating other patients and teaching time   Critical care was necessary to treat or prevent imminent or life-threatening deterioration of the following conditions:  Respiratory failure   Critical care was time spent  personally by me on the following activities:  Blood draw for specimens, development of treatment plan with patient or surrogate, discussions with consultants, evaluation of patient's response to treatment, examination of patient, ordering and performing treatments and interventions, ordering and review of radiographic studies, ordering and review of laboratory studies, pulse oximetry, re-evaluation of patient's condition, review of old charts and obtaining history from patient or surrogate   I assumed direction of critical care for this patient from another provider in my specialty: no   Comments:     Pt with COPD exacerbation requiring admission and causing elevation in troponin.    (including critical care time)  Medications Ordered in ED Medications  ipratropium-albuterol (DUONEB) 0.5-2.5 (3) MG/3ML nebulizer solution 3 mL (has no administration in time range)  methylPREDNISolone sodium succinate (SOLU-MEDROL) 125 mg/2 mL injection 125 mg (has no administration in time range)     Initial Impression / Assessment and Plan / ED Course  I have reviewed the triage vital signs and the nursing notes.  Pertinent labs & imaging results that were available during my care of the patient were reviewed by me and considered in my medical decision making (see chart for details).  Clinical Course as of Jun 07 1526  Wed Jun 07, 2018  1513 EKGNormal sinus rhythm rate 77Nonspecific T wave abnormality, baseline artifact, no prior EKG for comparison   [JK]    Clinical Course User Index [JK] Dorie Rank, MD    Patient presenting for evaluation of right hip pain and shortness of breath.  Physical exam shows patient who appears chronically ill, but no acute distress.  Pulmonary exam with minimal air movement in all fields, but no respiratory distress.  Breathing treatment given, will start Solu-Medrol, obtain chest x-ray for further evaluation. Patient's right hip exam is reassuring, as pain is reproducible  with palpation of the musculature.  Likely MSK/arthritis.  Low suspicion for fracture dislocation, as patient had no injury.  However, she does have a history of compression fractures and other pathologic fractures, will obtain x-ray.  Patient with mild swelling of the left leg, however this is been evaluated multiple times with an ultrasound, was negative for DVT.  No swelling of the right leg, with the right hip pain is.  Low suspicion for DVT at this time.  Discussed with attending, Dr. Tomi Bamberger evaluated the patient.  Chest x-ray viewed interpreted by me, no pneumonia, no source, effusion.  Hip x-ray without fracture dislocation.  Shows arthritis and osteoporosis.  Discussed findings with patient and husband.  Discussed likely arthritic pain.  On reassessment, patient reports no significant change in her breathing status after breathing treatment.  Labs pending.  Labs with elevated troponin at 0.17.  Upon chart review, patient has had elevated troponins in the past, but never this high, and her most recent troponin was negative. I believe this is likely due to COPD exacerbation, but considering pt's age and elevated troponin, will  admit for trending. EKG not crossing over in the EMR, but when compared to previous, is not changed. Pt remains without CP.   Discussed with Dr. Hal Hope from [facility], pt to be admitted.    Final Clinical Impressions(s) / ED Diagnoses   Final diagnoses:  Elevated troponin  COPD exacerbation [facility])    ED Discharge Orders    None       Erick Alley 06/07/18 2007    Dorie Rank, MD 06/08/18 [place], Azari Janssens, PA-C 06/25/18 1515    Dorie Rank, MD 06/28/18 1606

## 2018-06-07 NOTE — H&P (Signed)
History and Physical    Alexis Higgins MAU:[id number] DOB: 04/20/1941 DOA: 06/07/2018  PCP: Biagio Borg, MD  Patient coming from: Home.  Chief Complaint: Shortness of breath.  HPI: Alexis Higgins is a 78 y.o. female with history of COPD, diastolic dysfunction.  2D echo done in October 2019 showed EF 60 to 65% with grade 2 diastolic dysfunction presents to the ER with complaint of right increasing shortness of breath.  Patient was just discharged 10 days ago after being admitted for COPD exacerbation at that time patient also had viral infection.  Patient states over the last 2 days patient has been getting more short of breath on minimal exertion but denies any productive cough fever chills or chest pain.  2 days ago patient also started developing right hip pain which has become progressively worse.  Is noticed some lower extremity edema which is chronic and during last admissions patient had Dopplers done which were negative for PE also at that time also CT angios was done which was negative for PE.  ED Course: In the ER patient was hypoxic mildly wheezing was placed on steroids and nebulizer treatment.  Chest x-ray was unremarkable.  Lab work showed leukocytosis BNP of 118 troponin mildly elevated at 0.17.  EKG is pending.  And x-ray of the right hip did not show any fracture but did show osteoarthritic changes.  Patient states she has significant pain on moving her right hip.  Denies any trauma or fall.  Patient admitted for acute respiratory failure with hypoxia.  Review of Systems: As per HPI, rest all negative.   Past Medical History:  Diagnosis Date  . ANXIETY 01/01/2007  . Blood transfusion without reported diagnosis 2015  . BURSITIS, RIGHT HIP 06/04/2009  . CAD (coronary artery disease)    a. BMS to LAD 2010. b. NSTEMI with DES to LAD for ISR 2011. c. Patent stent 03/2012/Imdur added.  . Cataract   . Colon polyps    H/o tubular adenoma of colon  . COPD 01/01/2007   a.  Chronic resp failure on home O2.  Marland Kitchen DEPRESSION 01/01/2007  . Eczema 01/08/2011  . GROIN PAIN 06/20/2008  . Headache(784.0) 01/01/2007  . Hemorrhoid   . HYPERTENSION 01/01/2007  . Impaired glucose tolerance 01/07/2011  . LOW BACK PAIN 01/01/2007  . Muscle weakness (generalized) 06/04/2009  . On home oxygen therapy    "2L; 24/7" (03/21/2018)  . OSTEOARTHRITIS, HIP 07/01/2008  . OSTEOPOROSIS 01/01/2007  . Pneumonia 1998  . Rosacea 01/08/2011  . SYNCOPE 01/01/2007  . Thoracic compression fracture ([place])   . TRANSIENT ISCHEMIC ATTACK, HX OF 01/01/2007    Past Surgical History:  Procedure Laterality Date  . ABDOMINAL HYSTERECTOMY    . APPENDECTOMY    . BACK SURGERY    . BREAST ENHANCEMENT SURGERY    . COLONOSCOPY  01/25/2002   tubular adenoma,hemorrhoids, hyperplastic  colon polyps  . COLONOSCOPY  02/17/2005   hemorrhoids  . CORONARY ANGIOPLASTY    . CORONARY STENT PLACEMENT    . ESOPHAGOGASTRODUODENOSCOPY (EGD) WITH PROPOFOL N/A 08/10/2017   Procedure: ESOPHAGOGASTRODUODENOSCOPY (EGD) WITH PROPOFOL;  Surgeon: Gatha Mayer, MD;  Location: [facility];  Service: Endoscopy;  Laterality: N/A;  . HIP ARTHROPLASTY Left 10/01/2013   Procedure: ARTHROPLASTY UNIPOLAR   HIP;  Surgeon: Marianna Payment, MD;  Location: [facility];  Service: Orthopedics;  Laterality: Left;  . HIP SURGERY Left    DR XU     PROXIMAL NECK   . IR GENERIC  HISTORICAL  02/19/2016   IR VERTEBROPLASTY CERV/THOR BX INC UNI/BIL INC/INJECT/IMAGING 02/19/2016 Luanne Bras, MD [facility]  . IR GENERIC HISTORICAL  07/15/2016   IR KYPHO THORACIC WITH BONE BIOPSY 07/15/2016 Luanne Bras, MD [facility]  . IR KYPHO EA ADDL LEVEL THORACIC OR LUMBAR  09/14/2016  . IR RADIOLOGIST EVAL & MGMT  09/22/2016  . IR VERTEBROPLASTY CERV/THOR BX INC UNI/BIL INC/INJECT/IMAGING  10/08/2016  . IR VERTEBROPLASTY CERV/THOR BX INC UNI/BIL INC/INJECT/IMAGING  11/16/2016  . KYPHOPLASTY  12/2015-10/2016 X 5   "?2lumbar & 3 thoracic"  . LEFT HEART  CATHETERIZATION WITH CORONARY ANGIOGRAM N/A 04/13/2012   Procedure: LEFT HEART CATHETERIZATION WITH CORONARY ANGIOGRAM;  Surgeon: Burnell Blanks, MD;  Location: [facility] CATH LAB;  Service: Cardiovascular;  Laterality: N/A;  . OOPHORECTOMY     one ovary  . s/p bilat cataract  2010  . sp lumbar disc surgury     [name]     reports that she quit smoking about 12 years ago. Her smoking use included cigarettes. She has a 76.50 pack-year smoking history. She has never used smokeless tobacco. She reports that she does not drink alcohol or use drugs.  Allergies  Allergen Reactions  . Aspirin Other (See Comments)     cns bleed risk  . Codeine Anaphylaxis and Rash  . Other Other (See Comments)    Stiolto - severe reaction - caused inability to breath    Family History  Problem Relation Age of Onset  . Osteoporosis Mother   . Heart disease Sister   . Emphysema Sister   . Seizures Sister        epilepsy  . Cardiomyopathy Sister   . Heart attack Sister   . Colon cancer Neg Hx   . Colon polyps Neg Hx   . Esophageal cancer Neg Hx   . Rectal cancer Neg Hx   . Stomach cancer Neg Hx     Prior to Admission medications   Medication Sig Start Date End Date Taking? Authorizing Provider  acetaminophen (TYLENOL) 500 MG tablet Take 1,000 mg by mouth every 6 (six) hours as needed for moderate pain or headache.    Yes [provider]  Cholecalciferol (VITAMIN D) 1000 UNITS capsule Take 1,000 Units by mouth every evening.    Yes [provider]  clopidogrel (PLAVIX) 75 MG tablet TAKE 1 TABLET BY MOUTH EVERY DAY 06/06/18  Yes Burnell Blanks, MD  docusate sodium (COLACE) 100 MG capsule Take 100 mg by mouth every evening.    Yes [provider]  hydrOXYzine (ATARAX/VISTARIL) 25 MG tablet TAKE 1 OR 2 TABLETS BY MOUTH EVERY 8 HOURS AS NEEDED FOR ANXIETY Patient taking differently: Take 25 mg by mouth 2 (two) times daily.  02/28/18  Yes Biagio Borg, MD    isosorbide mononitrate (IMDUR) 30 MG 24 hr tablet TAKE 0.5 TABLETS (15 MG TOTAL) BY MOUTH DAILY. Patient taking differently: Take 15 mg by mouth daily. TAKE 0.5 TABLETS (15 MG TOTAL) BY MOUTH DAILY. 04/11/18  Yes Burnell Blanks, MD  lovastatin (MEVACOR) 20 MG tablet TAKE 20 MG BY MOUTH AT BEDTIME Patient taking differently: Take 20 mg by mouth at bedtime.  02/15/18  Yes Biagio Borg, MD  metoprolol tartrate (LOPRESSOR) 25 MG tablet TAKE 1 TABLET BY MOUTH TWICE A DAY NEEDS APPT Patient taking differently: Take 25 mg by mouth 2 (two) times daily.  03/16/18  Yes Burnell Blanks, MD  ondansetron (ZOFRAN) 4 MG tablet Take 1 tablet (  4 mg total) by mouth every 8 (eight) hours as needed for nausea or vomiting. Patient taking differently: Take 4 mg by mouth 2 (two) times daily.  01/06/16  Yes Mackuen, Courteney Lyn, MD  OXYGEN Inhale 2 L into the lungs continuous.    Yes [provider]  polyethylene glycol (MIRALAX / GLYCOLAX) packet Take 17 g by mouth daily as needed for mild constipation.   Yes [provider]  potassium chloride (K-DUR) 10 MEQ tablet Take 1 tablet (10 mEq total) by mouth daily. 08/15/17  Yes Biagio Borg, MD  predniSONE (DELTASONE) 10 MG tablet Take 4 tablets for 2 days then 3 tablets for 3 days then resume prior home dose of 15 mg daily. Patient taking differently: Take 20 mg by mouth daily with breakfast.  05/30/18  Yes Regalado, Belkys A, MD  PROAIR HFA 108 (90 Base) MCG/ACT inhaler INHALE 2 PUFFS INTO THE LUNGS EVERY 6 (SIX) HOURS AS NEEDED FOR WHEEZING OR SHORTNESS OF BREATH. 05/09/18  Yes Biagio Borg, MD  tiotropium (SPIRIVA HANDIHALER) 18 MCG inhalation capsule Place 1 capsule (18 mcg total) into inhaler and inhale daily at 6 (six) AM. 04/11/18  Yes Biagio Borg, MD  traMADol (ULTRAM) 50 MG tablet Take 100 mg by mouth 2 (two) times daily.    Yes [provider]  traZODone (DESYREL) 50 MG tablet Take 50 mg by mouth at bedtime.   Yes  [provider]  vitamin B-12 (CYANOCOBALAMIN) 1000 MCG tablet Take 1,000 mcg by mouth every evening.    Yes [provider]  vitamin C (ASCORBIC ACID) 500 MG tablet Take 500 mg by mouth every evening.    Yes [provider]  diclofenac sodium (VOLTAREN) 1 % GEL APPLY 4 GRAMS TOPICALLY 4 TIMES DAILY Patient taking differently: Apply 4 g topically 4 (four) times daily as needed (pain).  04/04/18   Biagio Borg, MD  furosemide (LASIX) 20 MG tablet Take 1 tablet (20 mg total) by mouth 2 (two) times daily as needed. 03/11/17   Biagio Borg, MD  nitroGLYCERIN (NITROSTAT) 0.4 MG SL tablet Place 1 tablet (0.4 mg total) under the tongue every 5 (five) minutes as needed for chest pain (up to 3 doses). 04/13/12   Dunn, Nedra Hai, PA-C  polyvinyl alcohol (LIQUIFILM TEARS) 1.4 % ophthalmic solution Place 1 drop into both eyes daily as needed for dry eyes.     [provider]    Physical Exam: Vitals:   06/07/18 1830 06/07/18 1900 06/07/18 1930 06/07/18 2030  BP: 136/85 (!) 158/85 (!) 137/93 (!) 153/83  Pulse: 76 77 76 80  Resp: 15 14 11 15   Temp:      TempSrc:      SpO2:  97% 98% 97%  Weight:      Height:          Constitutional: Moderately built and nourished. Vitals:   06/07/18 1830 06/07/18 1900 06/07/18 1930 06/07/18 2030  BP: 136/85 (!) 158/85 (!) 137/93 (!) 153/83  Pulse: 76 77 76 80  Resp: 15 14 11 15   Temp:      TempSrc:      SpO2:  97% 98% 97%  Weight:      Height:       Eyes: Anicteric no pallor. ENMT: No discharge from the ears eyes nose or mouth. Neck: No mass felt.  No neck rigidity.  No JVD appreciated. Respiratory: Mild expiratory wheeze and no crepitations. Cardiovascular: S1-S2 heard. Abdomen: Soft nontender bowel  sounds present. Musculoskeletal: Mild edema.  Periphery is mildly cold but pulses are felt. Skin: No rash. Neurologic: Alert awake oriented to time place and person.  Moves all extremities. Psychiatric: Appears normal.   Normal affect.   Labs on Admission: I have personally reviewed following labs and imaging studies  CBC: Recent Labs  Lab 06/07/18 1450  WBC 18.0*  HGB 13.6  HCT 44.7  MCV 91.8  PLT 494   Basic Metabolic Panel: Recent Labs  Lab 06/07/18 1450  NA 142  K 4.2  CL 99  CO2 36*  GLUCOSE 114*  BUN 14  CREATININE 0.95  CALCIUM 9.0   GFR: Estimated Creatinine Clearance: 44 mL/min (by C-G formula based on SCr of 0.95 mg/dL). Liver Function Tests: Recent Labs  Lab 06/07/18 1450  AST 17  ALT 16  ALKPHOS 58  BILITOT 1.0  PROT 6.1*  ALBUMIN 3.7   No results for input(s): LIPASE, AMYLASE in the last 168 hours. No results for input(s): AMMONIA in the last 168 hours. Coagulation Profile: No results for input(s): INR, PROTIME in the last 168 hours. Cardiac Enzymes: Recent Labs  Lab 06/07/18 1450  TROPONINI 0.17*   BNP (last 3 results) No results for input(s): PROBNP in the last 8760 hours. HbA1C: No results for input(s): HGBA1C in the last 72 hours. CBG: No results for input(s): GLUCAP in the last 168 hours. Lipid Profile: No results for input(s): CHOL, HDL, LDLCALC, TRIG, CHOLHDL, LDLDIRECT in the last 72 hours. Thyroid Function Tests: No results for input(s): TSH, T4TOTAL, FREET4, T3FREE, THYROIDAB in the last 72 hours. Anemia Panel: No results for input(s): VITAMINB12, FOLATE, FERRITIN, TIBC, IRON, RETICCTPCT in the last 72 hours. Urine analysis:    Component Value Date/Time   COLORURINE YELLOW 02/13/2018 [place] 02/13/2018 1408   LABSPEC 1.010 02/13/2018 1408   PHURINE 6.5 02/13/2018 1408   GLUCOSEU NEGATIVE 02/13/2018 1408   HGBUR NEGATIVE 02/13/2018 1408   BILIRUBINUR NEGATIVE 02/13/2018 1408   KETONESUR NEGATIVE 02/13/2018 1408   PROTEINUR NEGATIVE 11/13/2016 1120   UROBILINOGEN 0.2 02/13/2018 1408   NITRITE NEGATIVE 02/13/2018 1408   LEUKOCYTESUR NEGATIVE 02/13/2018 1408   Sepsis  Labs: @LABRCNTIP (procalcitonin:4,lacticidven:4) )No results found for this or any previous visit (from the past 240 hour(s)).   Radiological Exams on Admission: Dg Chest Port 1 View  Result Date: 06/07/2018 CLINICAL DATA:  Shortness of breath and right hip pain. No known injury. History of COPD. EXAM: PORTABLE CHEST 1 VIEW COMPARISON:  CT and radiographs 05/29/2018. FINDINGS: 1657 hours. The heart size and mediastinal contours are stable. The lungs are hyperinflated with stable diffuse emphysematous changes. No superimposed airspace disease, pneumothorax or significant pleural effusion. Old rib fractures on the left, previous multilevel spinal augmentation and bilateral breast implants are noted. IMPRESSION: Stable radiographic appearance of the chest without acute findings. Chronic obstructive pulmonary disease. Electronically Signed   By: Richardean Sale M.D.   On: 06/07/2018 17:14   Dg Hip Unilat W Or Wo Pelvis 2-3 Views Right  Result Date: 06/07/2018 CLINICAL DATA:  78 year old female with right hip pain for the past 3 days. No acute injury. EXAM: DG HIP (WITH OR WITHOUT PELVIS) 2-3V RIGHT COMPARISON:  None. FINDINGS: Prior left hip arthroplasty without evidence of hardware complication. Moderate degenerative changes are present at the right hip including narrowing of the medial joint space, subchondral sclerosis and osteophyte formation. The bones appear diffusely demineralized. No evidence of acute fracture or malalignment. IMPRESSION: 1. Moderate right hip joint osteoarthritis. 2.  Left hip arthroplasty without evidence of hardware complication. 3. The bones appear generally demineralized concerning for osteoporosis. Electronically Signed   By: Jacqulynn Cadet M.D.   On: 06/07/2018 17:08     Assessment/Plan Principal Problem:   Acute respiratory failure with hypoxia (HCC) Active Problems:   Hyperlipidemia   Essential hypertension   CAD, NATIVE VESSEL   COPD (chronic obstructive  pulmonary disease) with emphysema (HCC)   Right hip pain    1. Acute respiratory failure with hypoxia suspect likely from COPD for which patient is placed on nebulizer IV steroids and Pulmicort.  If symptoms persist may consider IV Lasix. 2. Right hip pain -denies any trauma or fall.  X-ray does not show any fracture but patient is complaining of significant pain for which I have ordered MRI. 3. Elevated troponin with history of CAD -denies any chest pain we will trend troponins.  EKG has been ordered.  Patient is on Plavix beta-blocker statin and Imdur. 4. Hyperlipidemia on statins. 5. Chronic diastolic CHF 2D echo done and October 2019 was showing EF of 60 to 65% with grade 2 diastolic dysfunction.  Consider Lasix if patient's symptoms persist.   DVT prophylaxis: Lovenox. Code Status: Full code. Family Communication: Discussed with patient husband. Disposition Plan: Home. Consults called: None. Admission status: Inpatient.   [name] MD [facility] Pager [phone].  If 7PM-7AM, please contact night-coverage [email or web address] Password [facility]  06/07/2018, 8:47 PM

## 2018-06-07 NOTE — ED Notes (Signed)
Patient transported to X-ray 

## 2018-06-07 NOTE — ED Notes (Signed)
Patient placed on bedpan to urinate, patient was reminded to keep pulse ox on her finger, husband states "it is too tight on her finger."

## 2018-06-07 NOTE — ED Provider Notes (Signed)
MSE was initiated and I personally evaluated the patient and placed orders (if any) at  2:53 PM on June 07, 2018.  Patient presents to the emergency room with complaints of shortness of breath.  She has a history of COPD.  She denies any chest pain or fevers.  Patient also complains of pain in her right hip.  She denies any recent falls or injuries.    On exam patient does have decreased breath sounds and wheezing.  She is speaking in full sentences with supplemental oxygen.  No signs of severe distress.  Patient also has pain with range of motion of her right hip.  Mild bilateral pedal edema.  Laboratory tests x-rays have been ordered. The patient appears stable so that the remainder of the MSE may be completed by another provider.    Dorie Rank, MD 06/07/18 [phone]

## 2018-06-07 NOTE — ED Triage Notes (Signed)
Patient arrived by EMS from home. Pt c/o SOB and RT hip pain. Patient states that the RT hip pain increased her SOB. EMS reported diminished in all lung lobes. Pt RT hip pain is not due to any fall or trauma.   Pt has hx of COPD.   SpO2 95% on 2L [place]

## 2018-06-07 NOTE — Telephone Encounter (Signed)
Thank you for letting me know

## 2018-06-07 NOTE — Telephone Encounter (Signed)
Called and spoke with patients husband, he stated that patient is on her way to the hospital due to having increased SOB. Patients husband wanted RB to know as an FYI. Will route to RB.

## 2018-06-08 ENCOUNTER — Inpatient Hospital Stay (HOSPITAL_COMMUNITY): Payer: Medicare Other

## 2018-06-08 DIAGNOSIS — M4848XA Fatigue fracture of vertebra, sacral and sacrococcygeal region, initial encounter for fracture: Secondary | ICD-10-CM

## 2018-06-08 DIAGNOSIS — I5033 Acute on chronic diastolic (congestive) heart failure: Secondary | ICD-10-CM

## 2018-06-08 DIAGNOSIS — J439 Emphysema, unspecified: Secondary | ICD-10-CM

## 2018-06-08 LAB — BASIC METABOLIC PANEL WITH GFR
Anion gap: 8 (ref 5–15)
BUN: 15 mg/dL (ref 8–23)
CO2: 35 mmol/L — ABNORMAL HIGH (ref 22–32)
Calcium: 8.4 mg/dL — ABNORMAL LOW (ref 8.9–10.3)
Chloride: 98 mmol/L (ref 98–111)
Creatinine, Ser: 0.87 mg/dL (ref 0.44–1.00)
GFR calc Af Amer: >60 mL/min
GFR calc non Af Amer: >60 mL/min
Glucose, Bld: 119 mg/dL — ABNORMAL HIGH (ref 70–99)
Potassium: 4.6 mmol/L (ref 3.5–5.1)
Sodium: 141 mmol/L (ref 135–145)

## 2018-06-08 LAB — CBC
HCT: 39.5 % (ref 36.0–46.0)
Hemoglobin: 12.3 g/dL (ref 12.0–15.0)
MCH: 28.4 pg (ref 26.0–34.0)
MCHC: 31.1 g/dL (ref 30.0–36.0)
MCV: 91.2 fL (ref 80.0–100.0)
Platelets: 237 10*3/uL (ref 150–400)
RBC: 4.33 MIL/uL (ref 3.87–5.11)
RDW: 12.9 % (ref 11.5–15.5)
WBC: 14.6 10*3/uL — ABNORMAL HIGH (ref 4.0–10.5)
nRBC: 0 % (ref 0.0–0.2)

## 2018-06-08 LAB — TROPONIN I
Troponin I: 0.2 ng/mL (ref <0.03)
Troponin I: 0.17 ng/mL

## 2018-06-08 MED ORDER — IPRATROPIUM-ALBUTEROL 0.5-2.5 (3) MG/3ML IN SOLN
3.0000 mL | Freq: Three times a day (TID) | RESPIRATORY_TRACT | Status: DC
  Administered 2018-06-08 – 2018-06-13 (×13): 3 mL via RESPIRATORY_TRACT
  Filled 2018-06-08 (×14): qty 3

## 2018-06-08 MED ORDER — ONDANSETRON HCL 4 MG PO TABS
4.0000 mg | ORAL_TABLET | Freq: Two times a day (BID) | ORAL | Status: DC
  Administered 2018-06-08 – 2018-06-13 (×12): 4 mg via ORAL
  Filled 2018-06-08 (×12): qty 1

## 2018-06-08 MED ORDER — ORAL CARE MOUTH RINSE
15.0000 mL | Freq: Two times a day (BID) | OROMUCOSAL | Status: DC
  Administered 2018-06-08 – 2018-06-12 (×5): 15 mL via OROMUCOSAL

## 2018-06-08 MED ORDER — FUROSEMIDE 10 MG/ML IJ SOLN
40.0000 mg | Freq: Once | INTRAMUSCULAR | Status: AC
  Administered 2018-06-09: 40 mg via INTRAVENOUS
  Filled 2018-06-08: qty 4

## 2018-06-08 MED ORDER — METHYLPREDNISOLONE SODIUM SUCC 40 MG IJ SOLR
40.0000 mg | Freq: Two times a day (BID) | INTRAMUSCULAR | Status: DC
  Administered 2018-06-08 – 2018-06-09 (×2): 40 mg via INTRAVENOUS
  Filled 2018-06-08 (×2): qty 1

## 2018-06-08 MED ORDER — ENOXAPARIN SODIUM 40 MG/0.4ML ~~LOC~~ SOLN
40.0000 mg | SUBCUTANEOUS | Status: DC
  Administered 2018-06-08 – 2018-06-12 (×5): 40 mg via SUBCUTANEOUS
  Filled 2018-06-08 (×5): qty 0.4

## 2018-06-08 MED ORDER — FUROSEMIDE 10 MG/ML IJ SOLN
40.0000 mg | Freq: Once | INTRAMUSCULAR | Status: AC
  Administered 2018-06-08: 40 mg via INTRAVENOUS
  Filled 2018-06-08: qty 4

## 2018-06-08 MED ORDER — TRAMADOL HCL 50 MG PO TABS
100.0000 mg | ORAL_TABLET | Freq: Two times a day (BID) | ORAL | Status: DC
  Administered 2018-06-08 – 2018-06-13 (×12): 100 mg via ORAL
  Filled 2018-06-08 (×12): qty 2

## 2018-06-08 NOTE — ED Notes (Signed)
MRI called, they are sending someone down shortly for hip MRI.

## 2018-06-08 NOTE — ED Notes (Signed)
Patient back from MRI.

## 2018-06-08 NOTE — ED Notes (Addendum)
Introduced self to patient and husband. Patient's husband very upset the patient is still in the ER. This RN explained the hospital bed status and noted the patient was receiving the same care in the ER as she would upstairs. Patient in hospital bed. Patient husband also upset the patient and himself have not eaten yet. Patient husband states he is the patient's 24 hour caregiver and he does not intend to leave her side and would like a tray with the patient. This RN explained that is not usual practice in the ED, but that I would attempt to get him a guest tray, since he has been holding in the ED. Patient VSS.

## 2018-06-08 NOTE — Progress Notes (Signed)
Patient arrived on unit from ED.  Family at bedside. Telemetry placed per MD order and CMT notified.

## 2018-06-08 NOTE — ED Notes (Signed)
ED TO INPATIENT HANDOFF REPORT  Name/Age/Gender Alexis Higgins 78 y.o. female  Code Status    Code Status Orders  (From admission, onward)         Start     Ordered   06/07/18 2044  Full code  Continuous     06/07/18 2045        Code Status History    Date Active Date Inactive Code Status Order ID Comments User Context   05/28/2018 1418 05/30/2018 1359 Full Code [id number]  Edwin Dada, MD ED   03/21/2018 1334 03/22/2018 1621 Full Code [id number]  Karmen Bongo, MD ED   12/24/2016 1947 12/26/2016 1822 Full Code [id number]  Vianne Bulls, MD ED   11/13/2016 1156 11/16/2016 1925 Full Code [id number]  Radene Gunning, NP ED   10/06/2016 0518 10/09/2016 2007 Full Code [id number]  Ivor Costa, MD ED   09/10/2016 1524 09/15/2016 1630 Partial Code [id number]  Brenton Grills, PA-C ED   10/01/2013 1856 10/04/2013 1836 Full Code [id number]  Marianna Payment, MD Inpatient   09/30/2013 2102 10/01/2013 1856 Full Code [id number]  Phillips Grout, MD Inpatient   04/12/2012 1950 04/13/2012 2000 Full Code [id number]  Hardie Pulley, RN ED      Home/SNF/Other Home  Chief Complaint shob;right hip pain  Level of Care/Admitting Diagnosis ED Disposition    ED Disposition Condition [facility] Area: [facility] [[id number]]  Level of Care: Telemetry [5]  Admit to tele based on following criteria: Monitor for Ischemic changes  Diagnosis: Acute respiratory failure with hypoxia [facility]) [[id number]]  Admitting Physician: [name] [phone]  Attending Physician: [name] [phone]  Estimated length of stay: past midnight tomorrow  Certification:: I certify this patient will need inpatient services for at least 2 midnights  PT Class (Do Not Modify): Inpatient [101]  PT Acc Code (Do Not Modify): Private [1]       Medical History Past Medical History:  Diagnosis Date  . ANXIETY 01/01/2007  . Blood transfusion without reported diagnosis 2015  .  BURSITIS, RIGHT HIP 06/04/2009  . CAD (coronary artery disease)    a. BMS to LAD 2010. b. NSTEMI with DES to LAD for ISR 2011. c. Patent stent 03/2012/Imdur added.  . Cataract   . Colon polyps    H/o tubular adenoma of colon  . COPD 01/01/2007   a. Chronic resp failure on home O2.  Marland Kitchen DEPRESSION 01/01/2007  . Eczema 01/08/2011  . GROIN PAIN 06/20/2008  . Headache(784.0) 01/01/2007  . Hemorrhoid   . HYPERTENSION 01/01/2007  . Impaired glucose tolerance 01/07/2011  . LOW BACK PAIN 01/01/2007  . Muscle weakness (generalized) 06/04/2009  . On home oxygen therapy    "2L; 24/7" (03/21/2018)  . OSTEOARTHRITIS, HIP 07/01/2008  . OSTEOPOROSIS 01/01/2007  . Pneumonia 1998  . Rosacea 01/08/2011  . SYNCOPE 01/01/2007  . Thoracic compression fracture ([place])   . TRANSIENT ISCHEMIC ATTACK, HX OF 01/01/2007    Allergies Allergies  Allergen Reactions  . Aspirin Other (See Comments)     cns bleed risk  . Codeine Anaphylaxis and Rash  . Other Other (See Comments)    Stiolto - severe reaction - caused inability to breath    IV Location/Drains/Wounds Patient Lines/Drains/Airways Status   Active Line/Drains/Airways    Name:   Placement date:   Placement time:   Site:   Days:   Peripheral IV 06/07/18 Right Antecubital   06/07/18  1733    Antecubital   1   External Urinary Catheter   05/28/18    1057    -   11          Labs/Imaging Results for orders placed or performed during the hospital encounter of 06/07/18 (from the past 48 hour(s))  CBC     Status: Abnormal   Collection Time: 06/07/18  2:50 PM  Result Value Ref Range   WBC 18.0 (H) 4.0 - 10.5 K/uL   RBC 4.87 3.87 - 5.11 MIL/uL   Hemoglobin 13.6 12.0 - 15.0 g/dL   HCT 44.7 36.0 - 46.0 %   MCV 91.8 80.0 - 100.0 fL   MCH 27.9 26.0 - 34.0 pg   MCHC 30.4 30.0 - 36.0 g/dL   RDW 13.1 11.5 - 15.5 %   Platelets 244 150 - 400 K/uL   nRBC 0.0 0.0 - 0.2 %    Comment: Performed at [facility], [place] [address] [place], [place]  [address]  Comprehensive metabolic panel     Status: Abnormal   Collection Time: 06/07/18  2:50 PM  Result Value Ref Range   Sodium 142 135 - 145 mmol/L   Potassium 4.2 3.5 - 5.1 mmol/L   Chloride 99 98 - 111 mmol/L   CO2 36 (H) 22 - 32 mmol/L   Glucose, Bld 114 (H) 70 - 99 mg/dL   BUN 14 8 - 23 mg/dL   Creatinine, Ser 0.95 0.44 - 1.00 mg/dL   Calcium 9.0 8.9 - 10.3 mg/dL   Total Protein 6.1 (L) 6.5 - 8.1 g/dL   Albumin 3.7 3.5 - 5.0 g/dL   AST 17 15 - 41 U/L   ALT 16 0 - 44 U/L   Alkaline Phosphatase 58 38 - 126 U/L   Total Bilirubin 1.0 0.3 - 1.2 mg/dL   GFR calc non Af Amer 58 (L) >60 mL/min   GFR calc Af Amer >60 >60 mL/min   Anion gap 7 5 - 15    Comment: Performed at [facility], [place] [address], [place], [place] [address]  Troponin I - ONCE - STAT     Status: Abnormal   Collection Time: 06/07/18  2:50 PM  Result Value Ref Range   Troponin I 0.17 (HH) <0.03 ng/mL    Comment: CRITICAL RESULT CALLED TO, READ BACK BY AND VERIFIED WITH: Ouida Sills [id number] @ [id number] BY J SCOTTON Performed at [place] [address] [place], [place] [address]   Brain natriuretic peptide     Status: Abnormal   Collection Time: 06/07/18  2:50 PM  Result Value Ref Range   B Natriuretic Peptide 118.6 (H) 0.0 - 100.0 pg/mL    Comment: Performed at [facility], [place] [address], [place], [place] [address]  Troponin I - ONCE - STAT     Status: Abnormal   Collection Time: 06/07/18  8:33 PM  Result Value Ref Range   Troponin I 0.21 (HH) <0.03 ng/mL    Comment: CRITICAL VALUE NOTED.  VALUE IS CONSISTENT WITH PREVIOUSLY REPORTED AND CALLED VALUE. Performed at [facility], [place] [address] [place],  [address]   Troponin I - Now Then Q6H     Status: Abnormal   Collection Time: 06/08/18  1:01 AM  Result Value Ref Range   Troponin I 0.20 (HH) <0.03 ng/mL    Comment: CRITICAL VALUE NOTED.  VALUE IS CONSISTENT WITH  PREVIOUSLY REPORTED AND CALLED VALUE.  Performed at [facility], [place] [address] [place], [place] [address]   Basic metabolic panel     Status: Abnormal   Collection Time: 06/08/18  4:59 AM  Result Value Ref Range   Sodium 141 135 - 145 mmol/L   Potassium 4.6 3.5 - 5.1 mmol/L   Chloride 98 98 - 111 mmol/L   CO2 35 (H) 22 - 32 mmol/L   Glucose, Bld 119 (H) 70 - 99 mg/dL   BUN 15 8 - 23 mg/dL   Creatinine, Ser 0.87 0.44 - 1.00 mg/dL   Calcium 8.4 (L) 8.9 - 10.3 mg/dL   GFR calc non Af Amer >60 >60 mL/min   GFR calc Af Amer >60 >60 mL/min   Anion gap 8 5 - 15    Comment: Performed at [facility], [place] [address], [place], [place] [address]  CBC     Status: Abnormal   Collection Time: 06/08/18  4:59 AM  Result Value Ref Range   WBC 14.6 (H) 4.0 - 10.5 K/uL   RBC 4.33 3.87 - 5.11 MIL/uL   Hemoglobin 12.3 12.0 - 15.0 g/dL   HCT 39.5 36.0 - 46.0 %   MCV 91.2 80.0 - 100.0 fL   MCH 28.4 26.0 - 34.0 pg   MCHC 31.1 30.0 - 36.0 g/dL   RDW 12.9 11.5 - 15.5 %   Platelets 237 150 - 400 K/uL   nRBC 0.0 0.0 - 0.2 %    Comment: Performed at [facility], [place] [address] [place], [place] [address]  Troponin I - Now Then Q6H     Status: Abnormal   Collection Time: 06/08/18  7:15 AM  Result Value Ref Range   Troponin I 0.17 (HH) <0.03 ng/mL    Comment: CRITICAL VALUE NOTED.  VALUE IS CONSISTENT WITH PREVIOUSLY REPORTED AND CALLED VALUE. Performed at [facility], [place] [address] [place], [place] [address]    Mr Hip Right Wo Contrast  Result Date: 06/08/2018 CLINICAL DATA:  Right hip pain starting 2 days prior to imaging. EXAM: MR OF THE RIGHT HIP WITHOUT CONTRAST TECHNIQUE: Multiplanar, multisequence MR imaging was performed. No intravenous contrast was administered. COMPARISON:  Multiple exams, including 06/07/2018 FINDINGS: Metal artifact from the patient's left hip prosthesis causes field heterogeneity. Bones:  Vertically oriented stress fracture in the right sacral ala compatible with insufficiency fracture. Similar findings are not well appreciated in the left sacral ala although the field heterogeneity from the left hip prosthesis may reduce sensitivity. A do not see definite fractures of the pubic bones. Lower lumbar spondylosis and degenerative disc disease. No fracture of the right proximal femur is identified. Articular cartilage and labrum Articular cartilage: Mild axial narrowing of the articular cartilage thickness. Labrum:  No paralabral cyst, otherwise indeterminate. Joint or bursal effusion Joint effusion:  Small right hip joint effusion is present. Bursae: No regional bursitis Muscles and tendons Muscles and tendons: Low-level edema tracks within along the right hip adductor musculature example on image 17/5. There is also edema signal tracking around the right iliotibial band. Other findings Miscellaneous: Subcutaneous edema superficial to the tensor fascia lata muscle on the right. IMPRESSION: 1. Stress fracture oriented vertically in the right sacral ala compatible with insufficiency fracture. 2. Lower lumbar spondylosis and degenerative disc disease. Mild degenerative chondral thinning in the right hip joint. Small right hip joint effusion. 3. Low-level edema tracks in the subcutaneous tissues superficial to the tensor fascia lata muscle, and also along the  right iliotibial band. 4. Mild edema in the right hip adductor musculature, etiology uncertain. 5. There are some reduction in sensitivity and specificity due to field heterogeneity related to the left hip prosthesis. Electronically Signed   By: Van Clines M.D.   On: 06/08/2018 08:10   Dg Chest Port 1 View  Result Date: 06/07/2018 CLINICAL DATA:  Shortness of breath and right hip pain. No known injury. History of COPD. EXAM: PORTABLE CHEST 1 VIEW COMPARISON:  CT and radiographs 05/29/2018. FINDINGS: 1657 hours. The heart size and  mediastinal contours are stable. The lungs are hyperinflated with stable diffuse emphysematous changes. No superimposed airspace disease, pneumothorax or significant pleural effusion. Old rib fractures on the left, previous multilevel spinal augmentation and bilateral breast implants are noted. IMPRESSION: Stable radiographic appearance of the chest without acute findings. Chronic obstructive pulmonary disease. Electronically Signed   By: Richardean Sale M.D.   On: 06/07/2018 17:14   Dg Hip Unilat W Or Wo Pelvis 2-3 Views Right  Result Date: 06/07/2018 CLINICAL DATA:  78 year old female with right hip pain for the past 3 days. No acute injury. EXAM: DG HIP (WITH OR WITHOUT PELVIS) 2-3V RIGHT COMPARISON:  None. FINDINGS: Prior left hip arthroplasty without evidence of hardware complication. Moderate degenerative changes are present at the right hip including narrowing of the medial joint space, subchondral sclerosis and osteophyte formation. The bones appear diffusely demineralized. No evidence of acute fracture or malalignment. IMPRESSION: 1. Moderate right hip joint osteoarthritis. 2. Left hip arthroplasty without evidence of hardware complication. 3. The bones appear generally demineralized concerning for osteoporosis. Electronically Signed   By: Jacqulynn Cadet M.D.   On: 06/07/2018 17:08   EKG Interpretation  Date/Time:  Thursday June 08 2018 06:42:19 EST Ventricular Rate:  61 PR Interval:    QRS Duration: 90 QT Interval:  472 QTC Calculation: 476 R Axis:   85 Text Interpretation:  Sinus rhythm Borderline right axis deviation Probable anteroseptal infarct, old Since last tracing rate slower Confirmed by Dorie Rank [id number]) on 06/08/2018 7:53:22 AM   Pending Labs Unresulted Labs (From admission, onward)   None      Vitals/Pain Today's Vitals   06/08/18 1002 06/08/18 1040 06/08/18 1100 06/08/18 1200  BP:  (!) 161/86 (!) 150/79 (!) 145/82  Pulse: 66 65 71 61  Resp:  18 14 17   Temp:   98.5 F (36.9 C)    TempSrc:  Oral    SpO2: 98% 99% 96% 95%  Weight:      Height:      PainSc:   3      Isolation Precautions No active isolations  Medications Medications  isosorbide mononitrate (IMDUR) 24 hr tablet 15 mg (15 mg Oral Given 06/08/18 1007)  pravastatin (PRAVACHOL) tablet 20 mg (20 mg Oral Given 06/07/18 2348)  metoprolol tartrate (LOPRESSOR) tablet 25 mg (25 mg Oral Given 06/08/18 1007)  nitroGLYCERIN (NITROSTAT) SL tablet 0.4 mg (has no administration in time range)  hydrOXYzine (ATARAX/VISTARIL) tablet 25 mg (25 mg Oral Given 06/08/18 1005)  traZODone (DESYREL) tablet 50 mg (50 mg Oral Given 06/07/18 2347)  docusate sodium (COLACE) capsule 100 mg (100 mg Oral Refused 06/07/18 2352)  polyethylene glycol (MIRALAX / GLYCOLAX) packet 17 g (has no administration in time range)  clopidogrel (PLAVIX) tablet 75 mg (75 mg Oral Given 06/08/18 0959)  vitamin B-12 (CYANOCOBALAMIN) tablet 1,000 mcg (1,000 mcg Oral Not Given 06/07/18 2352)  cholecalciferol (VITAMIN D3) tablet 1,000 Units (1,000 Units Oral Refused 06/07/18 2353)  potassium chloride (  K-DUR,KLOR-CON) CR tablet 10 mEq (has no administration in time range)  acetaminophen (TYLENOL) tablet 650 mg (has no administration in time range)    Or  acetaminophen (TYLENOL) suppository 650 mg (has no administration in time range)  ondansetron (ZOFRAN) tablet 4 mg (has no administration in time range)    Or  ondansetron (ZOFRAN) injection 4 mg (has no administration in time range)  albuterol (PROVENTIL) (2.5 MG/3ML) 0.083% nebulizer solution 2.5 mg (has no administration in time range)  budesonide (PULMICORT) nebulizer solution 0.25 mg (0.25 mg Nebulization Given 06/08/18 1008)  methylPREDNISolone sodium succinate (SOLU-MEDROL) 40 mg/mL injection 40 mg (40 mg Intravenous Given 06/08/18 1008)  ipratropium-albuterol (DUONEB) 0.5-2.5 (3) MG/3ML nebulizer solution 3 mL (3 mLs Nebulization Given 06/08/18 1008)  traMADol (ULTRAM) tablet 100 mg  (100 mg Oral Given 06/08/18 1006)  ondansetron (ZOFRAN) tablet 4 mg (4 mg Oral Given 06/08/18 1006)  ipratropium-albuterol (DUONEB) 0.5-2.5 (3) MG/3ML nebulizer solution 3 mL (3 mLs Nebulization Given 06/07/18 1548)  methylPREDNISolone sodium succinate (SOLU-MEDROL) 125 mg/2 mL injection 125 mg (125 mg Intravenous Given 06/07/18 1736)  acetaminophen (TYLENOL) tablet 650 mg (650 mg Oral Given 06/07/18 1952)  furosemide (LASIX) injection 40 mg (40 mg Intravenous Given 06/08/18 1008)    Mobility non-ambulatory

## 2018-06-08 NOTE — ED Notes (Signed)
Patient transported to MRI 

## 2018-06-08 NOTE — Progress Notes (Signed)
Unable to give neb due to pt being in MRI, Family called for neb when arrived back to floor. Came to see pt for nebulizer. Family stated they were meeting with their pastor and would like me to come back. I spoke to the nurse about administering nebulizer is she is available. If she is unable I will give prn when pt is ready for it.

## 2018-06-08 NOTE — Progress Notes (Signed)
PROGRESS NOTE  Alexis Higgins:[id number] DOB: Oct 04, 1940 DOA: 06/07/2018 PCP: Biagio Borg, MD   LOS: 1 day   HPI: Alexis Higgins is a 78 y.o. female with history of COPD, diastolic dysfunction.  2D echo done in October 2019 showed EF 60 to 65% with grade 2 diastolic dysfunction presents to the ER with complaint of right increasing shortness of breath.  Patient was just discharged 10 days ago after being admitted for COPD exacerbation at that time patient also had viral infection.  Patient states over the last 2 days patient has been getting more short of breath on minimal exertion but denies any productive cough fever chills or chest pain.  2 days ago patient also started developing right hip pain which has become progressively worse.  Is noticed some lower extremity edema which is chronic and during last admissions patient had Dopplers done which were negative for PE also at that time also CT angios was done which was negative for PE.  ED Course: In the ER patient was hypoxic mildly wheezing was placed on steroids and nebulizer treatment.  Chest x-ray was unremarkable.  Lab work showed leukocytosis BNP of 118 troponin mildly elevated at 0.17.  EKG is pending.  And x-ray of the right hip did not show any fracture but did show osteoarthritic changes.  Patient states she has significant pain on moving her right hip.  Denies any trauma or fall.  Patient admitted for acute respiratory failure with hypoxia.  Brief Narrative / Interim history: 78 year old female with history of COPD, diastolic CHF, HTN, CAD status post stent, HLD, venous insufficiency and PVD admitted with worsening exertional dyspnea and leg edema.  Started on steroid and breathing treatment out of concern for COPD exacerbation.  BNP and troponin elevated suggestive for demand ischemia in the setting of CHF exacerbation.  Also had right hip pain.  MRI shows stress fracture oriented vertically in the right sacral ala, compatible  with insufficiency fracture.    Subjective: Continues to endorse dyspnea, orthopnea and right hip pain.  Denies chest pain or palpitation.  Denies abdominal pain, nausea, vomiting or diarrhea.  Denies injury or fall.  Husband at bedside.  Upset about being in ED for long. Also upset when he heard about CHF exacerbation and elevated troponin. He says, "her cardiologist told us her heart is okay about two months ago".   Assessment & Plan: Principal Problem:   Acute respiratory failure with hypoxia (HCC) Active Problems:   Hyperlipidemia   Essential hypertension   CAD, NATIVE VESSEL   COPD (chronic obstructive pulmonary disease) with emphysema (HCC)   Right hip pain  Acute respiratory failure with hypoxia: Likely a combination of COPD and CHF exacerbation.  Continues to endorse dyspnea and orthopnea.  Exam with bibasilar crackles and 1+ edema, right greater than left.  Patient also have history of venous insufficiency.  BNP and troponin elevated. -Continue IV Solu-Medrol, budesonide and DuoNebs. -She has no increased sputum production.  So we will hold off antibiotic for now. -Start IV Lasix 40 mg daily and adjust as appropriate.  Acute exacerbation of chronic COPD: Stable with dyspnea and diminished air movement.   -Steroid and breathing treatments as above. -Wean oxygen as able to home level.  Acute exacerbation of chronic diastolic CHF: Echo with EF of 60 to 65%, G2 DD on 03/22/2018.  Still with dyspnea and bibasilar crackles. BNP and troponin elevated suggesting demand ischemia in the setting of CHF. -IV Lasix 40 mg daily and adjust  as appropriate. -Daily weight, intake output and renal function.  Elevated troponin/history of CAD: Likely demand ischemia.  Pattern not consistent with ACS.  EKG without acute ischemic finding.  Patient without chest pain. -Continue home metoprolol, Imdur, Plavix, statin, nitro.  Right hip pain/stress fracture on the right sacral ala: Noted on MRI of  right hip.  -Discussed with orthopedic surgeon, Dr. Sharol Given who recommended WBAT, physical therapy and possible rehab placement -Continue tramadol -And PRN oxycodone for breakthrough pain. -PT/OT eval  Hypertension: Normotensive -Continue cardiac meds as above.  scheduled Meds: . budesonide (PULMICORT) nebulizer solution  0.25 mg Nebulization BID  . cholecalciferol  1,000 Units Oral QPM  . clopidogrel  75 mg Oral Daily  . docusate sodium  100 mg Oral QPM  . hydrOXYzine  25 mg Oral BID  . ipratropium-albuterol  3 mL Nebulization TID  . isosorbide mononitrate  15 mg Oral Daily  . mouth rinse  15 mL Mouth Rinse BID  . methylPREDNISolone (SOLU-MEDROL) injection  40 mg Intravenous Q24H  . metoprolol tartrate  25 mg Oral BID  . ondansetron  4 mg Oral Q12H  . potassium chloride  10 mEq Oral Daily  . pravastatin  20 mg Oral q1800  . traMADol  100 mg Oral Q12H  . traZODone  50 mg Oral QHS  . vitamin B-12  1,000 mcg Oral QPM   Continuous Infusions: PRN Meds:.acetaminophen **OR** acetaminophen, albuterol, nitroGLYCERIN, ondansetron **OR** ondansetron (ZOFRAN) IV, polyethylene glycol  DVT prophylaxis: Start subcu Lovenox Code Status: Full code Family Communication: Husband at bedside. Disposition Plan: Remains inpatient for IV diuretics and IV Solumedrol due to ongoing dyspnea, bibasilar crackles and poor aeration. MRI with sacral stress fracture. Need PT/OT evaluation and treatment. Final disposition likely SNF.  Patient has significant comorbidities including history of CAD, CHF and COPD.  High risk for deconditioning.  Consultants:   Orthopedic surgery, Dr. Sharol Given over the phone  Procedures:   none  Antimicrobials:  None  Objective: Vitals:   06/08/18 1100 06/08/18 1200 06/08/18 1327 06/08/18 1504  BP: (!) 150/79 (!) 145/82 (!) 162/77   Pulse: 71 61 62   Resp: 14 17 16    Temp:   98.2 F (36.8 C)   TempSrc:      SpO2: 96% 95% 97% 94%  Weight:      Height:         Intake/Output Summary (Last 24 hours) at 06/08/2018 1627 Last data filed at 06/08/2018 1240 Gross per 24 hour  Intake 600 ml  Output 650 ml  Net -50 ml   Filed Weights   06/07/18 1501  Weight: 55.3 kg    Examination:  GENERAL: Appears well. No acute distress.  HEENT: MMM.  Vision and Hearing grossly intact.  NECK: Supple.  No JVD.  LUNGS: No significant increased work of breathing.  Diminished air movement bilaterally.  Bibasilar crackles. HEART:  RRR. Heart sounds normal.  ABD: Bowel sounds present. Soft. Non tender.  MSK/EXT:   1+ edema, left greater than right (chronic).  No leg length discrepancy. Differed hip exam due to MRI finding but no focal tenderness over her right thigh laterally and medially SKIN: no apparent skin lesion.  NEURO: Awake, alert and oriented appropriately.  No gross deficit.  PSYCH: Calm. Normal affect.  Data Reviewed: I have independently reviewed following labs and imaging studies   CBC: Recent Labs  Lab 06/07/18 1450 06/08/18 0459  WBC 18.0* 14.6*  HGB 13.6 12.3  HCT 44.7 39.5  MCV 91.8 91.2  PLT 244 706   Basic Metabolic Panel: Recent Labs  Lab 06/07/18 1450 06/08/18 0459  NA 142 141  K 4.2 4.6  CL 99 98  CO2 36* 35*  GLUCOSE 114* 119*  BUN 14 15  CREATININE 0.95 0.87  CALCIUM 9.0 8.4*   GFR: Estimated Creatinine Clearance: 48 mL/min (by C-G formula based on SCr of 0.87 mg/dL). Liver Function Tests: Recent Labs  Lab 06/07/18 1450  AST 17  ALT 16  ALKPHOS 58  BILITOT 1.0  PROT 6.1*  ALBUMIN 3.7   No results for input(s): LIPASE, AMYLASE in the last 168 hours. No results for input(s): AMMONIA in the last 168 hours. Coagulation Profile: No results for input(s): INR, PROTIME in the last 168 hours. Cardiac Enzymes: Recent Labs  Lab 06/07/18 1450 06/07/18 2033 06/08/18 0101 06/08/18 0715  TROPONINI 0.17* 0.21* 0.20* 0.17*   BNP (last 3 results) No results for input(s): PROBNP in the last 8760  hours. HbA1C: No results for input(s): HGBA1C in the last 72 hours. CBG: No results for input(s): GLUCAP in the last 168 hours. Lipid Profile: No results for input(s): CHOL, HDL, LDLCALC, TRIG, CHOLHDL, LDLDIRECT in the last 72 hours. Thyroid Function Tests: No results for input(s): TSH, T4TOTAL, FREET4, T3FREE, THYROIDAB in the last 72 hours. Anemia Panel: No results for input(s): VITAMINB12, FOLATE, FERRITIN, TIBC, IRON, RETICCTPCT in the last 72 hours. Urine analysis:    Component Value Date/Time   COLORURINE YELLOW 02/13/2018 [place] 02/13/2018 1408   LABSPEC 1.010 02/13/2018 1408   PHURINE 6.5 02/13/2018 1408   GLUCOSEU NEGATIVE 02/13/2018 1408   HGBUR NEGATIVE 02/13/2018 1408   BILIRUBINUR NEGATIVE 02/13/2018 1408   KETONESUR NEGATIVE 02/13/2018 1408   PROTEINUR NEGATIVE 11/13/2016 1120   UROBILINOGEN 0.2 02/13/2018 1408   NITRITE NEGATIVE 02/13/2018 1408   LEUKOCYTESUR NEGATIVE 02/13/2018 1408   Sepsis Labs: Invalid input(s): PROCALCITONIN, LACTICIDVEN  No results found for this or any previous visit (from the past 240 hour(s)).    Radiology Studies: Mr Hip Right Wo Contrast  Result Date: 06/08/2018 CLINICAL DATA:  Right hip pain starting 2 days prior to imaging. EXAM: MR OF THE RIGHT HIP WITHOUT CONTRAST TECHNIQUE: Multiplanar, multisequence MR imaging was performed. No intravenous contrast was administered. COMPARISON:  Multiple exams, including 06/07/2018 FINDINGS: Metal artifact from the patient's left hip prosthesis causes field heterogeneity. Bones: Vertically oriented stress fracture in the right sacral ala compatible with insufficiency fracture. Similar findings are not well appreciated in the left sacral ala although the field heterogeneity from the left hip prosthesis may reduce sensitivity. A do not see definite fractures of the pubic bones. Lower lumbar spondylosis and degenerative disc disease. No fracture of the right proximal femur is  identified. Articular cartilage and labrum Articular cartilage: Mild axial narrowing of the articular cartilage thickness. Labrum:  No paralabral cyst, otherwise indeterminate. Joint or bursal effusion Joint effusion:  Small right hip joint effusion is present. Bursae: No regional bursitis Muscles and tendons Muscles and tendons: Low-level edema tracks within along the right hip adductor musculature example on image 17/5. There is also edema signal tracking around the right iliotibial band. Other findings Miscellaneous: Subcutaneous edema superficial to the tensor fascia lata muscle on the right. IMPRESSION: 1. Stress fracture oriented vertically in the right sacral ala compatible with insufficiency fracture. 2. Lower lumbar spondylosis and degenerative disc disease. Mild degenerative chondral thinning in the right hip joint. Small right hip joint effusion. 3. Low-level edema tracks in the subcutaneous tissues superficial  to the tensor fascia lata muscle, and also along the right iliotibial band. 4. Mild edema in the right hip adductor musculature, etiology uncertain. 5. There are some reduction in sensitivity and specificity due to field heterogeneity related to the left hip prosthesis. Electronically Signed   By: Van Clines M.D.   On: 06/08/2018 08:10   Dg Chest Port 1 View  Result Date: 06/07/2018 CLINICAL DATA:  Shortness of breath and right hip pain. No known injury. History of COPD. EXAM: PORTABLE CHEST 1 VIEW COMPARISON:  CT and radiographs 05/29/2018. FINDINGS: 1657 hours. The heart size and mediastinal contours are stable. The lungs are hyperinflated with stable diffuse emphysematous changes. No superimposed airspace disease, pneumothorax or significant pleural effusion. Old rib fractures on the left, previous multilevel spinal augmentation and bilateral breast implants are noted. IMPRESSION: Stable radiographic appearance of the chest without acute findings. Chronic obstructive pulmonary  disease. Electronically Signed   By: Richardean Sale M.D.   On: 06/07/2018 17:14   Dg Hip Unilat W Or Wo Pelvis 2-3 Views Right  Result Date: 06/07/2018 CLINICAL DATA:  78 year old female with right hip pain for the past 3 days. No acute injury. EXAM: DG HIP (WITH OR WITHOUT PELVIS) 2-3V RIGHT COMPARISON:  None. FINDINGS: Prior left hip arthroplasty without evidence of hardware complication. Moderate degenerative changes are present at the right hip including narrowing of the medial joint space, subchondral sclerosis and osteophyte formation. The bones appear diffusely demineralized. No evidence of acute fracture or malalignment. IMPRESSION: 1. Moderate right hip joint osteoarthritis. 2. Left hip arthroplasty without evidence of hardware complication. 3. The bones appear generally demineralized concerning for osteoporosis. Electronically Signed   By: Jacqulynn Cadet M.D.   On: 06/07/2018 17:08   [facility] Pager [phone]  If 7PM-7AM, please contact night-coverage [email or web address] Password [facility] 06/08/2018, 4:27 PM

## 2018-06-09 LAB — BASIC METABOLIC PANEL
ANION GAP: 11 (ref 5–15)
BUN: 23 mg/dL (ref 8–23)
CO2: 34 mmol/L — ABNORMAL HIGH (ref 22–32)
Calcium: 8.7 mg/dL — ABNORMAL LOW (ref 8.9–10.3)
Chloride: 96 mmol/L — ABNORMAL LOW (ref 98–111)
Creatinine, Ser: 1.04 mg/dL — ABNORMAL HIGH (ref 0.44–1.00)
GFR calc non Af Amer: 52 mL/min — ABNORMAL LOW (ref >60)
Glucose, Bld: 83 mg/dL (ref 70–99)
Potassium: 3.6 mmol/L (ref 3.5–5.1)
Sodium: 141 mmol/L (ref 135–145)

## 2018-06-09 LAB — MAGNESIUM: MAGNESIUM: 2.3 mg/dL (ref 1.7–2.4)

## 2018-06-09 MED ORDER — PREDNISONE 20 MG PO TABS
40.0000 mg | ORAL_TABLET | Freq: Every day | ORAL | Status: DC
  Administered 2018-06-10 – 2018-06-12 (×3): 40 mg via ORAL
  Filled 2018-06-09 (×3): qty 2

## 2018-06-09 MED ORDER — VITAMIN C 500 MG PO TABS
500.0000 mg | ORAL_TABLET | Freq: Every evening | ORAL | Status: DC
  Administered 2018-06-09 – 2018-06-12 (×4): 500 mg via ORAL
  Filled 2018-06-09 (×4): qty 1

## 2018-06-09 NOTE — Evaluation (Signed)
Physical Therapy Evaluation Patient Details Name: Alexis Higgins MRN: [id number] DOB: 1940/08/12 Today's Date: 06/09/2018   History of Present Illness  78 year old female with history of COPD, diastolic CHF, HTN, kyphoplasty x 4,  CAD status post stent, HLD, venous insufficiency and PVD admitted with worsening exertional dyspnea and leg edema. Imaging showed R sacral insufficiency fracture.   Clinical Impression  Pt admitted with above diagnosis. Pt currently with functional limitations due to the deficits listed below (see PT Problem List).  Pt reports she cannot tolerate mobility due to sacral pain. She agreed to bed level LE exercises. Instructed spouse in exercises to be done BID to minimize deconditioning. Spouse reports pt has been requiring increased assistance for WC transfers.  Pt will benefit from skilled PT to increase their independence and safety with mobility to allow discharge to the venue listed below.       Follow Up Recommendations SNF;Supervision for mobility/OOB;Supervision/Assistance - 24 hour    Equipment Recommendations  None recommended by PT    Recommendations for Other Services       Precautions / Restrictions Precautions Precautions: Fall Precaution Comments: most recent fall was in November 2019, pt loses balance frequently during transfers but spouse catches her Restrictions Weight Bearing Restrictions: No      Mobility  Bed Mobility               General bed mobility comments: deferred 2* painful sacral fracture, spouse reports pt had increased pain with rolling for bedpan earlier this morning  Transfers                    Ambulation/Gait                Stairs            Wheelchair Mobility    Modified Rankin (Stroke Patients Only)       Balance                                             Pertinent Vitals/Pain Pain Assessment: 0-10 Pain Score: 3  Pain Location: sacrum with LE  exercises Pain Descriptors / Indicators: Sore Pain Intervention(s): Limited activity within patient's tolerance;Monitored during session;Premedicated before session    Home Living Family/patient expects to be discharged to:: Private residence Living Arrangements: Spouse/significant other Available Help at Discharge: Family;Available 24 hours/day Type of Home: House Home Access: Stairs to enter   [employer] of Steps: 1 Home Layout: One level Home Equipment: Wheelchair - Visual merchandiser - standard Additional Comments: husband assists with WC transfers and WC management up the step; recently pt has required more assist for sit to stand    Prior Function Level of Independence: Needs assistance   Gait / Transfers Assistance Needed: WC transfers only, husband assists with transfers  ADL's / Homemaking Assistance Needed: husband assists wtih all ADLs        Hand Dominance        Extremity/Trunk Assessment   Upper Extremity Assessment Upper Extremity Assessment: Generalized weakness    Lower Extremity Assessment Lower Extremity Assessment: Generalized weakness(assessed at bed level, so limited. PMH of R foot drop 2* back surgery. R ankle DF AROM -10*, AAROM 0*; hip flexion and knee ext ~ +2/5 )    Cervical / Trunk Assessment Cervical / Trunk Assessment: (NT)  Communication   Communication: No difficulties  Cognition  Arousal/Alertness: Awake/alert Behavior During Therapy: WFL for tasks assessed/performed Overall Cognitive Status: Within Functional Limits for tasks assessed                                        General Comments      Exercises General Exercises - Lower Extremity Ankle Circles/Pumps: AROM;AAROM;Both;15 reps;Supine Quad Sets: AROM;Both;5 reps;Supine Gluteal Sets: AROM;Both;5 reps;Supine Heel Slides: AAROM;Both;10 reps;Supine Hip ABduction/ADduction: AAROM;10 reps;Both;Supine   Assessment/Plan    PT Assessment  Patient needs continued PT services  PT Problem List Decreased strength;Decreased range of motion;Decreased activity tolerance;Decreased mobility;Pain       PT Treatment Interventions DME instruction;Therapeutic exercise;Therapeutic activities;Patient/family education    PT Goals (Current goals can be found in the Care Plan section)  Acute Rehab PT Goals Patient Stated Goal: to be able to assist more with sit to stand transfers PT Goal Formulation: With patient/family Time For Goal Achievement: 06/23/18 Potential to Achieve Goals: Fair    Frequency Min 2X/week   Barriers to discharge        Co-evaluation               AM-PAC PT "6 Clicks" Mobility  Outcome Measure Help needed turning from your back to your side while in a flat bed without using bedrails?: A Lot Help needed moving from lying on your back to sitting on the side of a flat bed without using bedrails?: A Lot Help needed moving to and from a bed to a chair (including a wheelchair)?: A Lot Help needed standing up from a chair using your arms (e.g., wheelchair or bedside chair)?: A Lot Help needed to walk in hospital room?: Total Help needed climbing 3-5 steps with a railing? : Total 6 Click Score: 10    End of Session Equipment Utilized During Treatment: Gait belt Activity Tolerance: Patient limited by pain;Patient tolerated treatment well Patient left: in bed;with call bell/phone within reach;with family/visitor present Nurse Communication: Mobility status PT Visit Diagnosis: Pain;History of falling (Z91.81);Other abnormalities of gait and mobility (R26.89) Pain - Right/Left: Right Pain - part of body: (sacrum)    Time: 0102-7253 PT Time Calculation (min) (ACUTE ONLY): 40 min   Charges:   PT Evaluation $PT Eval Moderate Complexity: 1 Mod PT Treatments $Therapeutic Exercise: 23-37 mins        Blondell Reveal Kistler PT 06/09/2018  Acute Rehabilitation Services Pager [phone] Office  [phone]

## 2018-06-09 NOTE — Progress Notes (Signed)
PROGRESS NOTE    Alexis Higgins  IOE:[id number] DOB: 31-Jan-1941 DOA: 06/07/2018 PCP: Biagio Borg, MD   Brief Narrative:  78 year old with history of COPD diastolic dysfunction-grade 2, essential hypertension, COPD, CAD status post stent, peripheral vascular disease, hyperlipidemia came to the hospital with complaints of increasing shortness of breath since her discharge about 10 days ago.  Upon admission she was diagnosed with both COPD and CHF exacerbation.   Assessment & Plan:   Principal Problem:   Acute respiratory failure with hypoxia (HCC) Active Problems:   Hyperlipidemia   Essential hypertension   CAD, NATIVE VESSEL   COPD (chronic obstructive pulmonary disease) with emphysema (HCC)   Right hip pain  Acute respiratory distress with hypoxia, multifactorial, improving - Speech issues discussed separately below.  Acute exacerbation of mild persistent COPD -Continue scheduled and as needed nebulizers, budesonide -Patient refusing IV Solu-Medrol, will give prednisone 40 mg daily - Incentive spirometry.  Flutter valve as needed -Supplemental oxygen.  At home she is on 3-6 L nasal cannula depending on her activity  Acute exacerbation of diastolic congestive heart failure, ejection fraction 18-29, grade 2 diastolic dysfunction, class III - Slowly improving.  We will give 1 more dose of 40 mg IV Lasix.  Monitor urine output.  History of coronary artery disease - Slightly elevated troponin but not consistent with ischemia.  Continue home metoprolol, Imdur, Plavix, statin and nitro  Right hip pain due to stress fracture of right sacral alla - Previous provider, Dr. Cyndia Skeeters discussed it with Dr. Sharol Given from orthopedic who recommended weightbearing as tolerated in physical therapy.  Pain control.  Rehab placement if necessary.  Essential hypertension -Continue metoprolol  DVT prophylaxis: Lovenox Code Status: Full code Family Communication: Husband at bedside Disposition Plan:  Maintain inpatient stay until respiratory status is better.  Consultants:   Curbside orthopedic  Procedures:   None   Antimicrobials:   None    Subjective: Still has quite a bit of exertional sob, Doesn't ambulate much.   Review of Systems Otherwise negative except as per HPI, including: General: Denies fever, chills, night sweats or unintended weight loss. Resp: Denies  wheezing Cardiac: Denies chest pain, palpitations, orthopnea, paroxysmal nocturnal dyspnea. GI: Denies abdominal pain, nausea, vomiting, diarrhea or constipation GU: Denies dysuria, frequency, hesitancy or incontinence MS: Denies muscle aches, joint pain or swelling Neuro: Denies headache, neurologic deficits (focal weakness, numbness, tingling), abnormal gait Psych: Denies anxiety, depression, SI/HI/AVH Skin: Denies new rashes or lesions ID: Denies sick contacts, exotic exposures, travel  Objective: Vitals:   06/09/18 0539 06/09/18 0540 06/09/18 0829 06/09/18 1326  BP: (!) 150/71   (!) 146/76  Pulse: (!) 59 60 69 70  Resp: 16  17   Temp: 98.5 F (36.9 C)   98.6 F (37 C)  TempSrc: Oral   Oral  SpO2: 100% 100% 99% 96%  Weight: 53.5 kg     Height:        Intake/Output Summary (Last 24 hours) at 06/09/2018 1336 Last data filed at 06/09/2018 1318 Gross per 24 hour  Intake 480 ml  Output -  Net 480 ml   Filed Weights   06/07/18 1501 06/09/18 0539  Weight: 55.3 kg 53.5 kg    Examination:  General exam: Appears calm and comfortable on 2 L nasal cannula Respiratory system: Diffuse diminished breath sounds Cardiovascular system: S1 & S2 heard, RRR. No JVD, murmurs, rubs, gallops or clicks. No pedal edema. Gastrointestinal system: Abdomen is nondistended, soft and nontender. No organomegaly or masses  felt. Normal bowel sounds heard. Central nervous system: Alert and oriented. No focal neurological deficits. Extremities: Symmetric 4 x 5 power. Skin: No rashes, lesions or ulcers Psychiatry:  Judgement and insight appear normal. Mood & affect appropriate.     Data Reviewed:   CBC: Recent Labs  Lab 06/07/18 1450 06/08/18 0459  WBC 18.0* 14.6*  HGB 13.6 12.3  HCT 44.7 39.5  MCV 91.8 91.2  PLT 244 299   Basic Metabolic Panel: Recent Labs  Lab 06/07/18 1450 06/08/18 0459 06/09/18 0547  NA 142 141 141  K 4.2 4.6 3.6  CL 99 98 96*  CO2 36* 35* 34*  GLUCOSE 114* 119* 83  BUN 14 15 23   CREATININE 0.95 0.87 1.04*  CALCIUM 9.0 8.4* 8.7*  MG  --   --  2.3   GFR: Estimated Creatinine Clearance: 38.9 mL/min (A) (by C-G formula based on SCr of 1.04 mg/dL (H)). Liver Function Tests: Recent Labs  Lab 06/07/18 1450  AST 17  ALT 16  ALKPHOS 58  BILITOT 1.0  PROT 6.1*  ALBUMIN 3.7   No results for input(s): LIPASE, AMYLASE in the last 168 hours. No results for input(s): AMMONIA in the last 168 hours. Coagulation Profile: No results for input(s): INR, PROTIME in the last 168 hours. Cardiac Enzymes: Recent Labs  Lab 06/07/18 1450 06/07/18 2033 06/08/18 0101 06/08/18 0715  TROPONINI 0.17* 0.21* 0.20* 0.17*   BNP (last 3 results) No results for input(s): PROBNP in the last 8760 hours. HbA1C: No results for input(s): HGBA1C in the last 72 hours. CBG: No results for input(s): GLUCAP in the last 168 hours. Lipid Profile: No results for input(s): CHOL, HDL, LDLCALC, TRIG, CHOLHDL, LDLDIRECT in the last 72 hours. Thyroid Function Tests: No results for input(s): TSH, T4TOTAL, FREET4, T3FREE, THYROIDAB in the last 72 hours. Anemia Panel: No results for input(s): VITAMINB12, FOLATE, FERRITIN, TIBC, IRON, RETICCTPCT in the last 72 hours. Sepsis Labs: No results for input(s): PROCALCITON, LATICACIDVEN in the last 168 hours.  No results found for this or any previous visit (from the past 240 hour(s)).       Radiology Studies: Mr Hip Right Wo Contrast  Result Date: 06/08/2018 CLINICAL DATA:  Right hip pain starting 2 days prior to imaging. EXAM: MR OF THE  RIGHT HIP WITHOUT CONTRAST TECHNIQUE: Multiplanar, multisequence MR imaging was performed. No intravenous contrast was administered. COMPARISON:  Multiple exams, including 06/07/2018 FINDINGS: Metal artifact from the patient's left hip prosthesis causes field heterogeneity. Bones: Vertically oriented stress fracture in the right sacral ala compatible with insufficiency fracture. Similar findings are not well appreciated in the left sacral ala although the field heterogeneity from the left hip prosthesis may reduce sensitivity. A do not see definite fractures of the pubic bones. Lower lumbar spondylosis and degenerative disc disease. No fracture of the right proximal femur is identified. Articular cartilage and labrum Articular cartilage: Mild axial narrowing of the articular cartilage thickness. Labrum:  No paralabral cyst, otherwise indeterminate. Joint or bursal effusion Joint effusion:  Small right hip joint effusion is present. Bursae: No regional bursitis Muscles and tendons Muscles and tendons: Low-level edema tracks within along the right hip adductor musculature example on image 17/5. There is also edema signal tracking around the right iliotibial band. Other findings Miscellaneous: Subcutaneous edema superficial to the tensor fascia lata muscle on the right. IMPRESSION: 1. Stress fracture oriented vertically in the right sacral ala compatible with insufficiency fracture. 2. Lower lumbar spondylosis and degenerative disc disease. Mild degenerative chondral  thinning in the right hip joint. Small right hip joint effusion. 3. Low-level edema tracks in the subcutaneous tissues superficial to the tensor fascia lata muscle, and also along the right iliotibial band. 4. Mild edema in the right hip adductor musculature, etiology uncertain. 5. There are some reduction in sensitivity and specificity due to field heterogeneity related to the left hip prosthesis. Electronically Signed   By: Van Clines M.D.   On:  06/08/2018 08:10   Dg Chest Port 1 View  Result Date: 06/07/2018 CLINICAL DATA:  Shortness of breath and right hip pain. No known injury. History of COPD. EXAM: PORTABLE CHEST 1 VIEW COMPARISON:  CT and radiographs 05/29/2018. FINDINGS: 1657 hours. The heart size and mediastinal contours are stable. The lungs are hyperinflated with stable diffuse emphysematous changes. No superimposed airspace disease, pneumothorax or significant pleural effusion. Old rib fractures on the left, previous multilevel spinal augmentation and bilateral breast implants are noted. IMPRESSION: Stable radiographic appearance of the chest without acute findings. Chronic obstructive pulmonary disease. Electronically Signed   By: Richardean Sale M.D.   On: 06/07/2018 17:14   Dg Hip Unilat W Or Wo Pelvis 2-3 Views Right  Result Date: 06/07/2018 CLINICAL DATA:  78 year old female with right hip pain for the past 3 days. No acute injury. EXAM: DG HIP (WITH OR WITHOUT PELVIS) 2-3V RIGHT COMPARISON:  None. FINDINGS: Prior left hip arthroplasty without evidence of hardware complication. Moderate degenerative changes are present at the right hip including narrowing of the medial joint space, subchondral sclerosis and osteophyte formation. The bones appear diffusely demineralized. No evidence of acute fracture or malalignment. IMPRESSION: 1. Moderate right hip joint osteoarthritis. 2. Left hip arthroplasty without evidence of hardware complication. 3. The bones appear generally demineralized concerning for osteoporosis. Electronically Signed   By: Jacqulynn Cadet M.D.   On: 06/07/2018 17:08        Scheduled Meds: . budesonide (PULMICORT) nebulizer solution  0.25 mg Nebulization BID  . cholecalciferol  1,000 Units Oral QPM  . clopidogrel  75 mg Oral Daily  . docusate sodium  100 mg Oral QPM  . enoxaparin (LOVENOX) injection  40 mg Subcutaneous Q24H  . hydrOXYzine  25 mg Oral BID  . ipratropium-albuterol  3 mL Nebulization TID  .  isosorbide mononitrate  15 mg Oral Daily  . mouth rinse  15 mL Mouth Rinse BID  . metoprolol tartrate  25 mg Oral BID  . ondansetron  4 mg Oral Q12H  . potassium chloride  10 mEq Oral Daily  . pravastatin  20 mg Oral q1800  . [START ON 06/10/2018] predniSONE  40 mg Oral Q breakfast  . traMADol  100 mg Oral Q12H  . traZODone  50 mg Oral QHS  . vitamin B-12  1,000 mcg Oral QPM   Continuous Infusions:   LOS: 2 days   Time spent= 35 mins    Ankit Arsenio Loader, MD [facility]  If 7PM-7AM, please contact night-coverage [email or web address] 06/09/2018, 1:36 PM

## 2018-06-09 NOTE — Care Management Important Message (Signed)
Important Message  Patient Details  Name: Alexis Higgins MRN: [id number] Date of Birth: 26-May-1940   Medicare Important Message Given:  Yes    Edrei Norgaard 06/09/2018, 9:07 AM

## 2018-06-09 NOTE — Telephone Encounter (Signed)
Called to check on her status. Left a message for Mr Glore. Will try him again - or he may call to update me via Triage

## 2018-06-09 NOTE — Progress Notes (Signed)
OT Cancellation Note  Patient Details Name: Alexis Higgins MRN: [id number] DOB: 1941/01/31   Cancelled Treatment:    Reason Eval/Treat Not Completed: Other (comment) Met with pt in bed, husband at bedside and very frustrated about hospital stay and seeing more than one doctor. Listened with husband and pt, still refusing to engage in therapy. Pt and husband request going to [facility] for SNF level of care. Educated pt and husband that pt will need to engage in therapy for referral, still refusing at this time. Will continue to follow as pt available and appropriate.   Zenovia Jarred, MSOT, OTR/L Behavioral Health OT/ Acute Relief OT [facility] Office: [phone]  Zenovia Jarred 06/09/2018, 5:06 PM

## 2018-06-10 MED ORDER — FUROSEMIDE 20 MG PO TABS
20.0000 mg | ORAL_TABLET | Freq: Every day | ORAL | Status: DC
  Filled 2018-06-10 (×3): qty 1

## 2018-06-10 NOTE — NC FL2 (Signed)
[place] MEDICAID FL2 LEVEL OF CARE SCREENING TOOL     IDENTIFICATION  Patient Name: Alexis Higgins Birthdate: January 18, 1941 Sex: female Admission Date (Current Location): 06/07/2018  [facility] and [place] Number:  Herbalist and Address:  [facility],  [place], [place]      Provider Number: [id number]  Attending Physician Name and Address:  Elodia Florence., *  Relative Name and Phone Number:  Makena Murdock: [phone]    Current Level of Care: Hospital Recommended Level of Care: [place] Prior Approval Number:    Date Approved/Denied:   PASRR Number: [id number]  Discharge Plan: SNF    Current Diagnoses: Patient Active Problem List   Diagnosis Date Noted  . Acute respiratory failure with hypoxia ([place]) 06/07/2018  . Right hip pain 06/07/2018  . Elevated troponin   . COPD exacerbation ([place]) 05/28/2018  . Lymphedema 03/21/2018  . Left upper quadrant pain 12/21/2017  . 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 Insect bite, sequela 09/21/2017  . Hypokalemia 08/15/2017  . On home O2 07/27/2017  . Itching 07/08/2017  . Skin lesion 07/08/2017  . Ingrown nail 07/08/2017  . Back pain 10/06/2016  . Fracture of seventh thoracic vertebra ([place]) 10/06/2016  . Non-traumatic compression fracture of T7 thoracic vertebra   . Thoracic compression fracture ([place])   . Intractable back pain 09/10/2016  . Closed compression fracture of thoracic vertebra ([place])   . 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 Peripheral edema 06/15/2016  . Smoker 08/02/2014  . Peripheral vascular disease ([place]) 08/02/2014  . Abdominal pain, epigastric 11/22/2013  . Black stools 11/22/2013  . Displaced fracture of left femoral neck ([place]) 09/30/2013  . Fracture of femoral neck, left, closed ([place]) 09/30/2013  . Fall at home 09/30/2013  . Head contusion 09/30/2013  . Leg weakness, bilateral 07/21/2012  . Right foot drop  07/21/2012  . Unstable angina pectoris ([place]) 04/12/2012  . Chronic respiratory failure ([place]) 02/13/2012  . Localized swelling, mass and lump, neck 01/06/2012  . CAD (coronary artery disease) 12/14/2011  . Dyspnea 11/22/2011  . Personal history of colonic polyps 09/06/2011  . Eczema 01/08/2011  . Rosacea 01/08/2011  . URI (upper respiratory infection) 01/08/2011  . Dizziness - light-headed 01/08/2011  . Encounter for long-term (current) use of high-risk medication 01/08/2011  . Impaired glucose tolerance 01/07/2011  . Preventative health care 01/07/2011  . BURSITIS, RIGHT HIP 06/04/2009  . CAD, NATIVE VESSEL 01/15/2009  . Other symptoms involving cardiovascular system 01/15/2009  . CHEST PAIN-PRECORDIAL 01/15/2009  . OSTEOARTHRITIS, HIP 07/01/2008  . Hyperlipidemia 01/01/2007  . Anxiety state 01/01/2007  . 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 Essential hypertension 01/01/2007  . COPD (chronic obstructive pulmonary disease) with emphysema ([place]) 01/01/2007  . LOW BACK PAIN 01/01/2007  . Osteoporosis 01/01/2007  . SYNCOPE 01/01/2007  . Headache(784.0) 01/01/2007  . TRANSIENT ISCHEMIC ATTACK, HX OF 01/01/2007    Orientation RESPIRATION BLADDER Height & Weight     Self, Time, Situation, Place  O2(2L) Indwelling catheter Weight: 117 lb 15.1 oz (53.5 kg) Height:  5\' 7"  (170.2 cm)  BEHAVIORAL SYMPTOMS/MOOD NEUROLOGICAL BOWEL NUTRITION STATUS      Continent Diet(Regular)  AMBULATORY STATUS COMMUNICATION OF NEEDS Skin   Extensive Assist Verbally Normal                       Personal Care Assistance Level of Assistance  Bathing, Dressing, Feeding Bathing Assistance: Maximum assistance Feeding assistance: Limited  assistance Dressing Assistance: Maximum assistance     Functional Limitations Info  Speech, Hearing, Sight Sight Info: Adequate Hearing Info: Adequate Speech Info: Adequate    SPECIAL CARE FACTORS FREQUENCY  PT (By licensed PT), OT (By licensed OT)     PT  Frequency: 5x/week OT Frequency: 5x/week            Contractures Contractures Info: Not present    Additional Factors Info  Code Status, Allergies Code Status Info: Full Allergies Info: ASPIRIN, CODEINE, OTHER            Current Medications (06/10/2018):  This is the current hospital active medication list Current Facility-Administered Medications  Medication Dose Route Frequency Provider Last Rate Last Dose  . acetaminophen (TYLENOL) tablet 650 mg  650 mg Oral Q6H PRN Amin, Ankit Chirag, MD       Or  . acetaminophen (TYLENOL) suppository 650 mg  650 mg Rectal Q6H PRN Amin, Ankit Chirag, MD      . albuterol (PROVENTIL) (2.5 MG/3ML) 0.083% nebulizer solution 2.5 mg  2.5 mg Nebulization Q2H PRN Amin, Ankit Chirag, MD      . budesonide (PULMICORT) nebulizer solution 0.25 mg  0.25 mg Nebulization BID Amin, Ankit Chirag, MD   0.25 mg at 06/10/18 0941  . cholecalciferol (VITAMIN D3) tablet 1,000 Units  1,000 Units Oral QPM Amin, Jeanella Flattery, MD   1,000 Units at 06/09/18 1758  . clopidogrel (PLAVIX) tablet 75 mg  75 mg Oral Daily Amin, Ankit Chirag, MD   75 mg at 06/10/18 1004  . docusate sodium (COLACE) capsule 100 mg  100 mg Oral QPM Amin, Ankit Chirag, MD   100 mg at 06/09/18 1758  . enoxaparin (LOVENOX) injection 40 mg  40 mg Subcutaneous Q24H Amin, Ankit Chirag, MD   40 mg at 06/09/18 2109  . hydrOXYzine (ATARAX/VISTARIL) tablet 25 mg  25 mg Oral BID Amin, Ankit Chirag, MD   25 mg at 06/10/18 1004  . ipratropium-albuterol (DUONEB) 0.5-2.5 (3) MG/3ML nebulizer solution 3 mL  3 mL Nebulization TID Amin, Ankit Chirag, MD   3 mL at 06/10/18 1419  . isosorbide mononitrate (IMDUR) 24 hr tablet 15 mg  15 mg Oral Daily Amin, Ankit Chirag, MD   15 mg at 06/10/18 1004  . MEDLINE mouth rinse  15 mL Mouth Rinse BID Amin, Ankit Chirag, MD   15 mL at 06/10/18 1007  . metoprolol tartrate (LOPRESSOR) tablet 25 mg  25 mg Oral BID Amin, Ankit Chirag, MD   25 mg at 06/10/18 1004  . nitroGLYCERIN  (NITROSTAT) SL tablet 0.4 mg  0.4 mg Sublingual Q5 min PRN Amin, Ankit Chirag, MD      . ondansetron (ZOFRAN) tablet 4 mg  4 mg Oral Q6H PRN Amin, Ankit Chirag, MD       Or  . ondansetron (ZOFRAN) injection 4 mg  4 mg Intravenous Q6H PRN Amin, Ankit Chirag, MD      . ondansetron (ZOFRAN) tablet 4 mg  4 mg Oral Q12H Amin, Ankit Chirag, MD   4 mg at 06/10/18 1004  . polyethylene glycol (MIRALAX / GLYCOLAX) packet 17 g  17 g Oral Daily PRN Amin, Ankit Chirag, MD      . potassium chloride (K-DUR,KLOR-CON) CR tablet 10 mEq  10 mEq Oral Daily Amin, Ankit Chirag, MD   10 mEq at 06/10/18 1004  . pravastatin (PRAVACHOL) tablet 20 mg  20 mg Oral q1800 Amin, Ankit Chirag, MD   20 mg at 06/09/18 1758  . predniSONE (  DELTASONE) tablet 40 mg  40 mg Oral Q breakfast Amin, Ankit Chirag, MD   40 mg at 06/10/18 1004  . traMADol (ULTRAM) tablet 100 mg  100 mg Oral Q12H Amin, Ankit Chirag, MD   100 mg at 06/10/18 1004  . traZODone (DESYREL) tablet 50 mg  50 mg Oral QHS Amin, Ankit Chirag, MD   50 mg at 06/09/18 2110  . vitamin B-12 (CYANOCOBALAMIN) tablet 1,000 mcg  1,000 mcg Oral QPM Amin, Ankit Chirag, MD   1,000 mcg at 06/09/18 1758  . vitamin C (ASCORBIC ACID) tablet 500 mg  500 mg Oral QPM Amin, Ankit Chirag, MD   500 mg at 06/09/18 1758     Discharge Medications: Please see discharge summary for a list of discharge medications.  Relevant Imaging Results:  Relevant Lab Results:   Additional Information SSN: [id number]  Pricilla Holm, [place]

## 2018-06-10 NOTE — Clinical Social Work Note (Signed)
Clinical Social Work Assessment  Patient Details  Name: Alexis Higgins MRN: [id number] Date of Birth: 17-Dec-1940  Date of referral:  06/10/18               Reason for consult:  Facility Placement                Permission sought to share information with:  Facility Art therapist granted to share information::  Yes, Verbal Permission Granted  Name::     Makahla Kiser  Agency::  SNF  Relationship::  Spouse  Contact Information:  [phone]  Housing/Transportation Living arrangements for the past 2 months:  [place] of Information:  Patient, Spouse Patient Interpreter Needed:  None Criminal Activity/Legal Involvement Pertinent to Current Situation/Hospitalization:  No - Comment as needed Significant Relationships:  Spouse Lives with:  Spouse Do you feel safe going back to the place where you live?  Yes Need for family participation in patient care:  No (Coment)  Care giving concerns:  Patient lives at home with spouse. Admitted with worsening exertional dyspnea and leg edema. Imaging showed R sacral insufficiency fracture. PT recommending short term therapy at SNF before returning home.    Social Worker assessment / plan:  CSW spoke with patient and spouse, Percell Miller, at bedside to discuss discharge plan. Patient states she has recently been to [facility] and prefers to return there. Patient and spouse are eager to discharge and were disappointed when CSW explained that [facility] insurance will not approve until at least Monday.  They had questions regarding ability to discharge home and then entering SNF. CSW unsure of exact procedure for this scenario and will f/u with information.   Patient's spouse expressed frustration at amount of providers patient has seen since admission. He was frustrated when CSW explained there would be a different Education officer, museum on Monday.   CSW will complete FL2 and send referral to [facility]. Will f/u with patient and spouse as  soon as response is given from [facility] on ability to admit.  [facility] insurance auth required prior to discharge.  Employment status:  Retired Nurse, adult PT Recommendations:  [place] / Referral to community resources:  [place]  Patient/Family's Response to care:  Frustrated at amount of providers. Overall pleasant and appreciative of CSW involvement.  Patient/Family's Understanding of and Emotional Response to Diagnosis, Current Treatment, and Prognosis:  Patient and spouse understand SNF process and have been to SNF before.   Emotional Assessment Appearance:  Appears stated age Attitude/Demeanor/Rapport:  Engaged Affect (typically observed):  Appropriate, Pleasant Orientation:  Oriented to Self, Oriented to Place, Oriented to  Time, Oriented to Situation Alcohol / Substance use:  Not Applicable Psych involvement (Current and /or in the community):  No (Comment)  Discharge Needs  Concerns to be addressed:  Care Coordination Readmission within the last 30 days:  Yes Current discharge risk:  Physical Impairment Barriers to Discharge:  Ship broker, Continued Medical Work up   [employer], [place] 06/10/2018, 1:55 PM

## 2018-06-10 NOTE — Progress Notes (Signed)
PROGRESS NOTE    Alexis Higgins  TZG:[id number] DOB: [birth year] DOA: 06/07/2018 PCP: Biagio Borg, MD  Brief Narrative:  78 year old with history of COPD diastolic dysfunction-grade 2, essential hypertension, COPD on 2 L chronically, CAD status post stent, peripheral vascular disease, hyperlipidemia came to the hospital with complaints of increasing shortness of breath since her discharge about 10 days ago.  Upon admission she was diagnosed with both COPD and CHF exacerbation.   Assessment & Plan:   Principal Problem:   Acute respiratory failure with hypoxia (HCC) Active Problems:   Hyperlipidemia   Essential hypertension   CAD, NATIVE VESSEL   COPD (chronic obstructive pulmonary disease) with emphysema (HCC)   Right hip pain   Acute respiratory distress with hypoxia, multifactorial, improving - improving, pt currently on her home O2  Acute exacerbation of mild persistent COPD - Continue scheduled and as needed nebulizers, budesonide - No wheezing appreciated on exam, will complete course of steroids and then taper to her previous dose  - Incentive spirometry.  Flutter valve as needed -Supplemental oxygen.  At home she is on 2 L nasal cannula  Acute exacerbation of diastolic congestive heart failure, ejection fraction 75-91, grade 2 diastolic dysfunction, class III - She's improved on lasix, will continue PO daily 20 mg here until d/c then resume home prn dose  History of coronary artery disease - Slightly elevated troponin but not consistent with ACS.  Suspect demand ischemia in setting of above.  Continue home metoprolol, Imdur, Plavix, statin and nitro  Right hip pain due to stress fracture of right sacral alla - Previous provider, Dr. Cyndia Skeeters discussed it with Dr. Sharol Given from orthopedic who recommended weightbearing as tolerated in physical therapy.  Pain control.  Rehab placement if necessary.  Essential hypertension -Continue metoprolol   DVT prophylaxis:  lovenox Code Status: full  Family Communication: husband at bedside Disposition Plan: pending SNF placement   Consultants:   none  Procedures:   none  Antimicrobials:  Anti-infectives (From admission, onward)   None     Subjective: Pt feeling better today. Husband asking if they can go home. PT/OT recommending SNF, discussed recommendation for SNF and 24 hour care. Agreeable to hearing from social worker and agreeable to SNF after discussion  Objective: Vitals:   06/10/18 0449 06/10/18 0935 06/10/18 1419 06/10/18 1438  BP: (!) 156/79   (!) 144/85  Pulse: 61   64  Resp: 16   16  Temp: 98 F (36.7 C)   98.4 F (36.9 C)  TempSrc:    Oral  SpO2: 100% 96% 96% 97%  Weight:      Height:        Intake/Output Summary (Last 24 hours) at 06/10/2018 1831 Last data filed at 06/09/2018 1835 Gross per 24 hour  Intake 240 ml  Output -  Net 240 ml   Filed Weights   06/07/18 1501 06/09/18 0539  Weight: 55.3 kg 53.5 kg    Examination:  General exam: Appears calm and comfortable  Respiratory system: Decreased breath sounds, no wheezing appreciated  Cardiovascular system: S1 & S2 heard, RRR.  Gastrointestinal system: Abdomen is nondistended, soft and nontender Central nervous system: Alert and oriented. No focal neurological deficits. Extremities: chronic lee Skin: No rashes, lesions or ulcers Psychiatry: Judgement and insight appear normal. Mood & affect appropriate.     Data Reviewed: I have personally reviewed following labs and imaging studies  CBC: Recent Labs  Lab 06/07/18 1450 06/08/18 0459  WBC 18.0* 14.6*  HGB  13.6 12.3  HCT 44.7 39.5  MCV 91.8 91.2  PLT 244 267   Basic Metabolic Panel: Recent Labs  Lab 06/07/18 1450 06/08/18 0459 06/09/18 0547  NA 142 141 141  K 4.2 4.6 3.6  CL 99 98 96*  CO2 36* 35* 34*  GLUCOSE 114* 119* 83  BUN 14 15 23   CREATININE 0.95 0.87 1.04*  CALCIUM 9.0 8.4* 8.7*  MG  --   --  2.3   GFR: Estimated Creatinine  Clearance: 38.9 mL/min (Stephan Draughn) (by C-G formula based on SCr of 1.04 mg/dL (H)). Liver Function Tests: Recent Labs  Lab 06/07/18 1450  AST 17  ALT 16  ALKPHOS 58  BILITOT 1.0  PROT 6.1*  ALBUMIN 3.7   No results for input(s): LIPASE, AMYLASE in the last 168 hours. No results for input(s): AMMONIA in the last 168 hours. Coagulation Profile: No results for input(s): INR, PROTIME in the last 168 hours. Cardiac Enzymes: Recent Labs  Lab 06/07/18 1450 06/07/18 2033 06/08/18 0101 06/08/18 0715  TROPONINI 0.17* 0.21* 0.20* 0.17*   BNP (last 3 results) No results for input(s): PROBNP in the last 8760 hours. HbA1C: No results for input(s): HGBA1C in the last 72 hours. CBG: No results for input(s): GLUCAP in the last 168 hours. Lipid Profile: No results for input(s): CHOL, HDL, LDLCALC, TRIG, CHOLHDL, LDLDIRECT in the last 72 hours. Thyroid Function Tests: No results for input(s): TSH, T4TOTAL, FREET4, T3FREE, THYROIDAB in the last 72 hours. Anemia Panel: No results for input(s): VITAMINB12, FOLATE, FERRITIN, TIBC, IRON, RETICCTPCT in the last 72 hours. Sepsis Labs: No results for input(s): PROCALCITON, LATICACIDVEN in the last 168 hours.  No results found for this or any previous visit (from the past 240 hour(s)).       Radiology Studies: No results found.      Scheduled Meds: . budesonide (PULMICORT) nebulizer solution  0.25 mg Nebulization BID  . cholecalciferol  1,000 Units Oral QPM  . clopidogrel  75 mg Oral Daily  . docusate sodium  100 mg Oral QPM  . enoxaparin (LOVENOX) injection  40 mg Subcutaneous Q24H  . hydrOXYzine  25 mg Oral BID  . ipratropium-albuterol  3 mL Nebulization TID  . isosorbide mononitrate  15 mg Oral Daily  . mouth rinse  15 mL Mouth Rinse BID  . metoprolol tartrate  25 mg Oral BID  . ondansetron  4 mg Oral Q12H  . potassium chloride  10 mEq Oral Daily  . pravastatin  20 mg Oral q1800  . predniSONE  40 mg Oral Q breakfast  . traMADol   100 mg Oral Q12H  . traZODone  50 mg Oral QHS  . vitamin B-12  1,000 mcg Oral QPM  . vitamin C  500 mg Oral QPM   Continuous Infusions:   LOS: 3 days    Time spent: over 30 min    Fayrene Helper, MD [facility] Pager [phone]   If 7PM-7AM, please contact night-coverage [email or web address] Password [facility] 06/10/2018, 6:31 PM

## 2018-06-10 NOTE — Progress Notes (Signed)
OT Cancellation Note  Patient Details Name: Alexis Higgins MRN: [id number] DOB: 1940-06-14   Cancelled Treatment:    Reason Eval/Treat Not Completed: Pain limiting ability to participate. Pt did not want to participate in OT this afternoon. States pain is still bad.  If pt remains here, we will check back Monday  Jahmad Petrich 06/10/2018, 2:42 PM  Lesle Chris, OTR/L Acute Rehabilitation Services [phone] [facility] pager [phone] office 06/10/2018

## 2018-06-11 LAB — COMPREHENSIVE METABOLIC PANEL
ALBUMIN: 2.8 g/dL — AB (ref 3.5–5.0)
ALT: 10 U/L (ref 0–44)
AST: 11 U/L — ABNORMAL LOW (ref 15–41)
Alkaline Phosphatase: 46 U/L (ref 38–126)
Anion gap: 10 (ref 5–15)
BUN: 22 mg/dL (ref 8–23)
CO2: 33 mmol/L — ABNORMAL HIGH (ref 22–32)
Calcium: 8.6 mg/dL — ABNORMAL LOW (ref 8.9–10.3)
Chloride: 99 mmol/L (ref 98–111)
Creatinine, Ser: 0.74 mg/dL (ref 0.44–1.00)
GFR calc Af Amer: >60 mL/min (ref >60)
GFR calc non Af Amer: >60 mL/min (ref >60)
Glucose, Bld: 89 mg/dL (ref 70–99)
POTASSIUM: 3.9 mmol/L (ref 3.5–5.1)
Sodium: 142 mmol/L (ref 135–145)
Total Bilirubin: 0.9 mg/dL (ref 0.3–1.2)
Total Protein: 4.9 g/dL — ABNORMAL LOW (ref 6.5–8.1)

## 2018-06-11 LAB — CBC
HCT: 39.7 % (ref 36.0–46.0)
Hemoglobin: 12.1 g/dL (ref 12.0–15.0)
MCH: 27.9 pg (ref 26.0–34.0)
MCHC: 30.5 g/dL (ref 30.0–36.0)
MCV: 91.5 fL (ref 80.0–100.0)
NRBC: 0 % (ref 0.0–0.2)
Platelets: 204 10*3/uL (ref 150–400)
RBC: 4.34 MIL/uL (ref 3.87–5.11)
RDW: 12.8 % (ref 11.5–15.5)
WBC: 12.7 10*3/uL — AB (ref 4.0–10.5)

## 2018-06-11 LAB — MAGNESIUM: Magnesium: 2.4 mg/dL (ref 1.7–2.4)

## 2018-06-11 MED ORDER — DICLOFENAC SODIUM 1 % TD GEL
2.0000 g | Freq: Four times a day (QID) | TRANSDERMAL | Status: DC
  Administered 2018-06-11 – 2018-06-13 (×8): 2 g via TOPICAL
  Filled 2018-06-11: qty 100

## 2018-06-11 MED ORDER — LIDOCAINE 5 % EX PTCH
1.0000 | MEDICATED_PATCH | CUTANEOUS | Status: DC
  Administered 2018-06-11 – 2018-06-13 (×3): 1 via TRANSDERMAL
  Filled 2018-06-11 (×3): qty 1

## 2018-06-11 MED ORDER — ACETAMINOPHEN 500 MG PO TABS
1000.0000 mg | ORAL_TABLET | Freq: Three times a day (TID) | ORAL | Status: DC
  Administered 2018-06-11 – 2018-06-13 (×6): 1000 mg via ORAL
  Filled 2018-06-11 (×7): qty 2

## 2018-06-11 NOTE — Progress Notes (Signed)
PROGRESS NOTE    Alexis Higgins  JGG:[id number] DOB: 10-24-1940 DOA: 06/07/2018 PCP: Biagio Borg, MD  Brief Narrative:  78 year old with history of COPD diastolic dysfunction-grade 2, essential hypertension, COPD on 2 L chronically, CAD status post stent, peripheral vascular disease, hyperlipidemia came to the hospital with complaints of increasing shortness of breath since her discharge about 10 days ago.  Upon admission she was diagnosed with both COPD and CHF exacerbation.  Assessment & Plan:   Principal Problem:   Acute respiratory failure with hypoxia (HCC) Active Problems:   Hyperlipidemia   Essential hypertension   CAD, NATIVE VESSEL   COPD (chronic obstructive pulmonary disease) with emphysema (HCC)   Right hip pain   Acute respiratory distress with hypoxia, multifactorial, improving - improving, pt currently on her home O2  Acute exacerbation of mild persistent COPD - Continue scheduled and as needed nebulizers, budesonide - No wheezing appreciated on exam, will complete course of steroids and then taper to her previous dose - as outpatient will need to consider further taper especially in setting of her fracture below - Incentive spirometry.  Flutter valve as needed -Supplemental oxygen.  At home she is on 2 L nasal cannula all the time.  Acute exacerbation of diastolic congestive heart failure, ejection fraction 54-65, grade 2 diastolic dysfunction, class III - She's improved on lasix, will continue PO daily 20 mg here until d/c then resume home prn dose (family refused today)  History of coronary artery disease - Slightly elevated troponin but not consistent with ACS.  Suspect demand ischemia in setting of above.  Continue home metoprolol, Imdur, Plavix, statin and nitro  Right hip pain due to stress fracture of right sacral alla - Previous provider, Dr. Cyndia Skeeters discussed it with Dr. Sharol Given from orthopedic who recommended weightbearing as tolerated in physical  therapy.  Pain control.  Rehab placement if necessary. - Continue home tramadol, schedule apap, lidocaine patch, voltaren gel  Essential hypertension -Continue metoprolol   DVT prophylaxis: lovenox Code Status: full  Family Communication: husband at bedside Disposition Plan: pending SNF placement   Consultants:   none  Procedures:   none  Antimicrobials:  Anti-infectives (From admission, onward)   None     Subjective: Breathing is ok. Still with significant pain when moving.  Doesn't want to use oxycodone. Asking about topical things.  Objective: Vitals:   06/10/18 2219 06/11/18 0449 06/11/18 0853 06/11/18 1451  BP: (!) 149/90 (!) 167/83    Pulse: 66 (!) 59    Resp: 20 16    Temp:  97.7 F (36.5 C)    TempSrc:  Oral    SpO2: 97% 100% 98% 99%  Weight:  53 kg    Height:       No intake or output data in the 24 hours ending 06/11/18 1459 Filed Weights   06/07/18 1501 06/09/18 0539 06/11/18 0449  Weight: 55.3 kg 53.5 kg 53 kg    Examination:  General: No acute distress. Cardiovascular: Heart sounds show Josiane Labine regular rate, and rhythm.  Lungs: Clear to auscultation bilaterally  Abdomen: Soft, nontender, nondistended  Neurological: Alert and oriented 3. Moves all extremities 4. Cranial nerves II through XII grossly intact. Skin: Warm and dry. No rashes or lesions. Psychiatric: Mood and affect are normal. Insight and judgment are appropriate.   Data Reviewed: I have personally reviewed following labs and imaging studies  CBC: Recent Labs  Lab 06/07/18 1450 06/08/18 0459 06/11/18 0552  WBC 18.0* 14.6* 12.7*  HGB 13.6 12.3  12.1  HCT 44.7 39.5 39.7  MCV 91.8 91.2 91.5  PLT 244 237 440   Basic Metabolic Panel: Recent Labs  Lab 06/07/18 1450 06/08/18 0459 06/09/18 0547 06/11/18 0552  NA 142 141 141 142  K 4.2 4.6 3.6 3.9  CL 99 98 96* 99  CO2 36* 35* 34* 33*  GLUCOSE 114* 119* 83 89  BUN 14 15 23 22   CREATININE 0.95 0.87 1.04* 0.74  CALCIUM  9.0 8.4* 8.7* 8.6*  MG  --   --  2.3 2.4   GFR: Estimated Creatinine Clearance: 50.1 mL/min (by C-G formula based on SCr of 0.74 mg/dL). Liver Function Tests: Recent Labs  Lab 06/07/18 1450 06/11/18 0552  AST 17 11*  ALT 16 10  ALKPHOS 58 46  BILITOT 1.0 0.9  PROT 6.1* 4.9*  ALBUMIN 3.7 2.8*   No results for input(s): LIPASE, AMYLASE in the last 168 hours. No results for input(s): AMMONIA in the last 168 hours. Coagulation Profile: No results for input(s): INR, PROTIME in the last 168 hours. Cardiac Enzymes: Recent Labs  Lab 06/07/18 1450 06/07/18 2033 06/08/18 0101 06/08/18 0715  TROPONINI 0.17* 0.21* 0.20* 0.17*   BNP (last 3 results) No results for input(s): PROBNP in the last 8760 hours. HbA1C: No results for input(s): HGBA1C in the last 72 hours. CBG: No results for input(s): GLUCAP in the last 168 hours. Lipid Profile: No results for input(s): CHOL, HDL, LDLCALC, TRIG, CHOLHDL, LDLDIRECT in the last 72 hours. Thyroid Function Tests: No results for input(s): TSH, T4TOTAL, FREET4, T3FREE, THYROIDAB in the last 72 hours. Anemia Panel: No results for input(s): VITAMINB12, FOLATE, FERRITIN, TIBC, IRON, RETICCTPCT in the last 72 hours. Sepsis Labs: No results for input(s): PROCALCITON, LATICACIDVEN in the last 168 hours.  No results found for this or any previous visit (from the past 240 hour(s)).       Radiology Studies: No results found.      Scheduled Meds: . acetaminophen  1,000 mg Oral Q8H  . budesonide (PULMICORT) nebulizer solution  0.25 mg Nebulization BID  . cholecalciferol  1,000 Units Oral QPM  . clopidogrel  75 mg Oral Daily  . diclofenac sodium  2 g Topical QID  . docusate sodium  100 mg Oral QPM  . enoxaparin (LOVENOX) injection  40 mg Subcutaneous Q24H  . furosemide  20 mg Oral Daily  . hydrOXYzine  25 mg Oral BID  . ipratropium-albuterol  3 mL Nebulization TID  . isosorbide mononitrate  15 mg Oral Daily  . lidocaine  1 patch  Transdermal Q24H  . mouth rinse  15 mL Mouth Rinse BID  . metoprolol tartrate  25 mg Oral BID  . ondansetron  4 mg Oral Q12H  . potassium chloride  10 mEq Oral Daily  . pravastatin  20 mg Oral q1800  . predniSONE  40 mg Oral Q breakfast  . traMADol  100 mg Oral Q12H  . traZODone  50 mg Oral QHS  . vitamin B-12  1,000 mcg Oral QPM  . vitamin C  500 mg Oral QPM   Continuous Infusions:   LOS: 4 days    Time spent: over 30 min    Fayrene Helper, MD [facility] Pager [phone]   If 7PM-7AM, please contact night-coverage [email or web address] Password [facility] 06/11/2018, 2:59 PM

## 2018-06-12 ENCOUNTER — Telehealth: Payer: Self-pay | Admitting: Emergency Medicine

## 2018-06-12 LAB — CBC
HEMATOCRIT: 37.3 % (ref 36.0–46.0)
Hemoglobin: 11.4 g/dL — ABNORMAL LOW (ref 12.0–15.0)
MCH: 27.9 pg (ref 26.0–34.0)
MCHC: 30.6 g/dL (ref 30.0–36.0)
MCV: 91.4 fL (ref 80.0–100.0)
Platelets: 199 10*3/uL (ref 150–400)
RBC: 4.08 MIL/uL (ref 3.87–5.11)
RDW: 12.7 % (ref 11.5–15.5)
WBC: 11.9 10*3/uL — ABNORMAL HIGH (ref 4.0–10.5)
nRBC: 0 % (ref 0.0–0.2)

## 2018-06-12 LAB — COMPREHENSIVE METABOLIC PANEL
ALT: 13 U/L (ref 0–44)
AST: 12 U/L — ABNORMAL LOW (ref 15–41)
Albumin: 2.9 g/dL — ABNORMAL LOW (ref 3.5–5.0)
Alkaline Phosphatase: 44 U/L (ref 38–126)
Anion gap: 5 (ref 5–15)
BILIRUBIN TOTAL: 0.6 mg/dL (ref 0.3–1.2)
BUN: 24 mg/dL — ABNORMAL HIGH (ref 8–23)
CO2: 36 mmol/L — ABNORMAL HIGH (ref 22–32)
CREATININE: 0.88 mg/dL (ref 0.44–1.00)
Calcium: 8.5 mg/dL — ABNORMAL LOW (ref 8.9–10.3)
Chloride: 98 mmol/L (ref 98–111)
GFR calc Af Amer: >60 mL/min (ref >60)
GFR calc non Af Amer: >60 mL/min (ref >60)
Glucose, Bld: 103 mg/dL — ABNORMAL HIGH (ref 70–99)
Potassium: 4 mmol/L (ref 3.5–5.1)
Sodium: 139 mmol/L (ref 135–145)
Total Protein: 4.7 g/dL — ABNORMAL LOW (ref 6.5–8.1)

## 2018-06-12 LAB — MAGNESIUM: Magnesium: 2.2 mg/dL (ref 1.7–2.4)

## 2018-06-12 MED ORDER — PREDNISONE 20 MG PO TABS
30.0000 mg | ORAL_TABLET | Freq: Every day | ORAL | Status: DC
  Administered 2018-06-13: 30 mg via ORAL
  Filled 2018-06-12: qty 1

## 2018-06-12 NOTE — Care Management Important Message (Signed)
Important Message  Patient Details  Name: BRANDI TOMLINSON MRN: [id number] Date of Birth: 08-06-1940   Medicare Important Message Given:  Yes    Kerin Salen 06/12/2018, 11:51 AMImportant Message  Patient Details  Name: NICOLET GRIFFY MRN: [id number] Date of Birth: 1940-10-11   Medicare Important Message Given:  Yes    Kerin Salen 06/12/2018, 11:51 AM

## 2018-06-12 NOTE — Consult Note (Signed)
   [facility] CM Inpatient Consult   06/12/2018  Alexis Higgins 06-07-1940 [id number]     Patient screened for potential [facility] Care Management services due to unplanned readmission risk score of 28% (high) and multiple hospitalizations.  Chart reviewed. Noted discharge plans are for SNF. No identifiable [facility] Care Management needs at this time.   If disposition plans change, please consider [facility] Care Management consult.   Marthenia Rolling, MSN-Ed, RN,BSN [facility] Liaison [phone]

## 2018-06-12 NOTE — Telephone Encounter (Signed)
Spoke with the pt's spouse  He states that pt was having pain and SOB from her stress fracture  Spouse called EMS and had to her transported for hospital admission  She is improving and they hope she will get to go to [facility] for rehab  Will forward to [place] as FYI

## 2018-06-12 NOTE — Evaluation (Signed)
Occupational Therapy Evaluation Patient Details Name: Alexis Higgins MRN: [id number] DOB: [birth year] Today's Date: 06/12/2018    History of Present Illness 78 year old female with history of COPD, diastolic CHF, HTN, CAD status post stent, HLD, venous insufficiency and PVD admitted with worsening exertional dyspnea and leg edema.  Imaging showed R sacral insufficiency fracture.   Clinical Impression   This 78 yo female admitted with above presents to acute OT with increased pain with movement (pt prefers not to take narcotics, so is on tramadol, tylenol, pain patch, and pain cream), decreased mobility due to pain, decreased ability to sit and stand due to pain---all affecting her ability to A with basic ADLs. She will continue to benefit from OT here and at SNF to get to a point where she can move better and go home with husband.  Husband and pt asking about transferring to rehab--I called and left a message for SW [facility])    Follow Up Recommendations  SNF;Supervision/Assistance - 24 hour    Equipment Recommendations  Other (comment)(TBD post rehab stay)       Precautions / Restrictions Precautions Precautions: Fall Precaution Comments: most recent fall was in November 2019, pt loses balance frequently during transfers but spouse catches her Restrictions Weight Bearing Restrictions: No Other Position/Activity Restrictions: WBAT      Mobility Bed Mobility Overal bed mobility: Needs Assistance Bed Mobility: Supine to Sit;Sit to Supine     Supine to sit: Min guard;HOB elevated(use of rail) Sit to supine: Mod assist(A for legs back on bed )      Transfers Overall transfer level: Needs assistance Equipment used: Rolling walker (2 wheeled) Transfers: Sit to/from Stand Sit to Stand: Min assist              Balance Overall balance assessment: Needs assistance Sitting-balance support: No upper extremity supported;Feet supported;Bilateral upper extremity supported    Sitting balance - Comments: fair before standing (on UE A), poor once stood and sat back down (due to pain--Bil UE A)                                   ADL either performed or assessed with clinical judgement   ADL Overall ADL's : Needs assistance/impaired Eating/Feeding: Independent;Bed level Eating/Feeding Details (indicate cue type and reason): can't eat sitting due to pain Grooming: Supervision/safety;Set up;Bed level Grooming Details (indicate cue type and reason): can't tolerate sitting Upper Body Bathing: Supervision/ safety;Set up;Bed level Upper Body Bathing Details (indicate cue type and reason): can't tolerate sitting Lower Body Bathing: Maximal assistance Lower Body Bathing Details (indicate cue type and reason): min A sit<>stand but cannot stand for long due to pain Upper Body Dressing : Maximal assistance;Bed level Upper Body Dressing Details (indicate cue type and reason): can't tolerate sitting Lower Body Dressing: Total assistance Lower Body Dressing Details (indicate cue type and reason): min A sit<>stand but cannot stand for long due to pain                     Vision Patient Visual Report: No change from baseline              Pertinent Vitals/Pain Pain Score: 10-Worst pain ever Pain Location: sacrum, RLE Pain Descriptors / Indicators: Aching;Grimacing;Guarding;Sore Pain Intervention(s): Limited activity within patient's tolerance;Monitored during session;Premedicated before session;Repositioned     Hand Dominance Right   Extremity/Trunk Assessment Upper Extremity Assessment Upper Extremity Assessment: Generalized weakness  Communication Communication Communication: No difficulties   Cognition Arousal/Alertness: Awake/alert Behavior During Therapy: WFL for tasks assessed/performed Overall Cognitive Status: Within Functional Limits for tasks assessed                                                 Home Living Family/patient expects to be discharged to:: Private residence Living Arrangements: Spouse/significant other Available Help at Discharge: Family;Available 24 hours/day Type of Home: House Home Access: Stairs to enter [employer] of Steps: 1   Home Layout: One level     Bathroom Shower/Tub: Teacher, early years/pre: Handicapped height     Home Equipment: Wheelchair - Visual merchandiser - standard   Additional Comments: husband assists with WC transfers and WC management up the step; recently pt has required more assist for sit to stand; home O2 2 liters      Prior Functioning/Environment Level of Independence: Needs assistance  Gait / Transfers Assistance Needed: WC transfers only, husband assists with transfers ADL's / Homemaking Assistance Needed: husband assists wtih all ADLs            OT Problem List: Decreased strength;Decreased range of motion;Decreased activity tolerance;Impaired balance (sitting and/or standing);Pain;Decreased knowledge of use of DME or AE      OT Treatment/Interventions: Self-care/ADL training;Balance training;DME and/or AE instruction;Patient/family education    OT Goals(Current goals can be found in the care plan section) Acute Rehab OT Goals Patient Stated Goal: to not be in so much pain with getting up OT Goal Formulation: With patient/family Time For Goal Achievement: 06/26/18 Potential to Achieve Goals: Good  OT Frequency: Min 2X/week              AM-PAC OT "6 Clicks" Daily Activity     Outcome Measure Help from another person eating meals?: None Help from another person taking care of personal grooming?: A Little Help from another person toileting, which includes using toliet, bedpan, or urinal?: Total Help from another person bathing (including washing, rinsing, drying)?: A Lot Help from another person to put on and taking off regular upper body clothing?: A Lot Help from another  person to put on and taking off regular lower body clothing?: Total 6 Click Score: 13   End of Session Equipment Utilized During Treatment: Gait belt;Rolling walker Nurse Communication: (I called SW and left message due to pt and husband would like to talk with her)  Activity Tolerance: Patient limited by pain Patient left: in bed;with call bell/phone within reach;with family/visitor present  OT Visit Diagnosis: Other abnormalities of gait and mobility (R26.89);Unsteadiness on feet (R26.81);Pain Pain - part of body: (right LE and pelvis)                Time: 9485-4627 OT Time Calculation (min): 21 min Charges:  OT General Charges $OT Visit: 1 Visit  [place] [address], OTR/L Acute Rehab Services Pager [phone] Office [phone]     Almon Register 06/12/2018, 3:52 PM

## 2018-06-12 NOTE — Progress Notes (Signed)
Physical Therapy Treatment Patient Details Name: Alexis Higgins MRN: [id number] DOB: [birth year] Today's Date: 06/12/2018    History of Present Illness 78 year old female with history of COPD, diastolic CHF, HTN, CAD status post stent, HLD, venous insufficiency and PVD admitted with worsening exertional dyspnea and leg edema.  Imaging showed R sacral insufficiency fracture.    PT Comments    Pt progressing today,pain better controlled; pt able to participate with bed mobility and standing; continue to recommend SNF   Follow Up Recommendations  SNF;Supervision for mobility/OOB;Supervision/Assistance - 24 hour     Equipment Recommendations  None recommended by PT    Recommendations for Other Services       Precautions / Restrictions Precautions Precautions: Fall Precaution Comments: most recent fall was in November 2019, pt loses balance frequently during transfers but spouse catches her Restrictions Weight Bearing Restrictions: No Other Position/Activity Restrictions: WBAT    Mobility  Bed Mobility Overal bed mobility: Needs Assistance Bed Mobility: Supine to Sit;Sit to Supine     Supine to sit: Min assist Sit to supine: Max assist   General bed mobility comments: assist with LEs off bed, assist to control descent of trunk and LEs on return to supine  Transfers Overall transfer level: Needs assistance Equipment used: Rolling walker (2 wheeled) Transfers: Sit to/from Stand Sit to Stand: Min assist         General transfer comment: assist to rise and stabilize, pt very fearful of falling  Ambulation/Gait             General Gait Details: lateral steps along EOB with RW adn min/mod assist   Stairs             Wheelchair Mobility    Modified Rankin (Stroke Patients Only)       Balance                                            Cognition Arousal/Alertness: Awake/alert Behavior During Therapy: WFL for tasks  assessed/performed Overall Cognitive Status: Within Functional Limits for tasks assessed                                        Exercises General Exercises - Lower Extremity Ankle Circles/Pumps: AROM;Both;15 reps Quad Sets: AROM;Both;10 reps Heel Slides: AAROM;Both;10 reps;Supine Hip ABduction/ADduction: AAROM;10 reps;Both;Supine    General Comments        Pertinent Vitals/Pain Pain Assessment: 0-10 Pain Score: 3  Pain Location: sacrum, RLE Pain Descriptors / Indicators: Sore Pain Intervention(s): Limited activity within patient's tolerance;Monitored during session    Home Living                      Prior Function            PT Goals (current goals can now be found in the care plan section) Acute Rehab PT Goals Patient Stated Goal: to be able to assist more with sit to stand transfers PT Goal Formulation: With patient/family Time For Goal Achievement: 06/23/18 Potential to Achieve Goals: Fair Progress towards PT goals: Progressing toward goals    Frequency    Min 2X/week      PT Plan Current plan remains appropriate    Co-evaluation  AM-PAC PT "6 Clicks" Mobility   Outcome Measure  Help needed turning from your back to your side while in a flat bed without using bedrails?: A Lot Help needed moving from lying on your back to sitting on the side of a flat bed without using bedrails?: A Lot Help needed moving to and from a bed to a chair (including a wheelchair)?: A Lot Help needed standing up from a chair using your arms (e.g., wheelchair or bedside chair)?: A Lot Help needed to walk in hospital room?: Total Help needed climbing 3-5 steps with a railing? : Total 6 Click Score: 10    End of Session Equipment Utilized During Treatment: Gait belt Activity Tolerance: Patient tolerated treatment well;Patient limited by fatigue Patient left: in bed;with call bell/phone within reach;with family/visitor present   PT  Visit Diagnosis: Pain;History of falling (Z91.81);Other abnormalities of gait and mobility (R26.89) Pain - Right/Left: Right Pain - part of body: Hip     Time: 7096-4383 PT Time Calculation (min) (ACUTE ONLY): 15 min  Charges:  $Therapeutic Activity: 8-22 mins                     Kenyon Ana, PT  Pager: [phone] Acute Rehab Dept [facility]): [phone]   06/12/2018    [facility] 06/12/2018, 11:43 AM

## 2018-06-12 NOTE — Progress Notes (Signed)
PROGRESS NOTE    Alexis Higgins  WNI:[id number] DOB: 11-08-1940 DOA: 06/07/2018 PCP: Biagio Borg, MD  Brief Narrative:  78 year old with history of COPD diastolic dysfunction-grade 2, essential hypertension, COPD on 2 L chronically, CAD status post stent, peripheral vascular disease, hyperlipidemia came to the hospital with complaints of increasing shortness of breath since her discharge about 10 days ago.  Upon admission she was diagnosed with both COPD and CHF exacerbation.  Assessment & Plan:   Principal Problem:   Acute respiratory failure with hypoxia (HCC) Active Problems:   Hyperlipidemia   Essential hypertension   CAD, NATIVE VESSEL   COPD (chronic obstructive pulmonary disease) with emphysema (HCC)   Right hip pain   Acute respiratory distress with hypoxia, multifactorial, improving - improving, pt currently on her home O2  Acute exacerbation of mild persistent COPD - Continue scheduled and as needed nebulizers, budesonide - No wheezing appreciated on exam, will complete course of steroids (start to taper here) and then taper to her previous dose - as outpatient will need to consider further taper especially in setting of her fracture below - Incentive spirometry.  Flutter valve as needed -Supplemental oxygen.  At home she is on 2 L nasal cannula all the time.  Acute exacerbation of diastolic congestive heart failure, ejection fraction 38-18, grade 2 diastolic dysfunction, class III - She's improved on lasix, will continue PO daily 20 mg here until d/c then resume home prn dose  History of coronary artery disease - Slightly elevated troponin but not consistent with ACS.  Suspect demand ischemia in setting of above.  Continue home metoprolol, Imdur, Plavix, statin and nitro  Right hip pain due to stress fracture of right sacral alla - Previous provider, Dr. Cyndia Skeeters discussed it with Dr. Sharol Given from orthopedic who recommended weightbearing as tolerated in physical  therapy.  Pain control.  Rehab placement if necessary. - Continue home tramadol, schedule apap, lidocaine patch, voltaren gel  Essential hypertension -Continue metoprolol   DVT prophylaxis: lovenox Code Status: full  Family Communication: husband at bedside Disposition Plan: pending SNF placement. Awaiting auth.   Consultants:   none  Procedures:   none  Antimicrobials:  Anti-infectives (From admission, onward)   None     Subjective: Pain better SOB better  Objective: Vitals:   06/12/18 0448 06/12/18 0749 06/12/18 1438 06/12/18 1439  BP: (!) 162/67  (!) 164/71   Pulse: 61 65 70   Resp: 18 18 14    Temp: 98.2 F (36.8 C)  98.7 F (37.1 C)   TempSrc: Oral  Oral   SpO2: (!) 84% 95% 99% 99%  Weight:      Height:        Intake/Output Summary (Last 24 hours) at 06/12/2018 1758 Last data filed at 06/12/2018 0900 Gross per 24 hour  Intake 240 ml  Output -  Net 240 ml   Filed Weights   06/07/18 1501 06/09/18 0539 06/11/18 0449  Weight: 55.3 kg 53.5 kg 53 kg    Examination:  General: No acute distress. Cardiovascular: Heart sounds show Estevan Kersh regular rate, and rhythm Lungs: Clear to auscultation bilaterally  Abdomen: Soft, nontender, nondistended . Neurological: Alert and oriented 3. Moves all extremities 4. Cranial nerves II through XII grossly intact. Skin: Warm and dry. No rashes or lesions. Extremities: No clubbing or cyanosis. No edema. Psychiatric: Mood and affect are normal. Insight and judgment are appropriate.   Data Reviewed: I have personally reviewed following labs and imaging studies  CBC: Recent Labs  Lab 06/07/18 1450 06/08/18 0459 06/11/18 0552 06/12/18 0513  WBC 18.0* 14.6* 12.7* 11.9*  HGB 13.6 12.3 12.1 11.4*  HCT 44.7 39.5 39.7 37.3  MCV 91.8 91.2 91.5 91.4  PLT 244 237 204 604   Basic Metabolic Panel: Recent Labs  Lab 06/07/18 1450 06/08/18 0459 06/09/18 0547 06/11/18 0552 06/12/18 0513  NA 142 141 141 142 139  K 4.2  4.6 3.6 3.9 4.0  CL 99 98 96* 99 98  CO2 36* 35* 34* 33* 36*  GLUCOSE 114* 119* 83 89 103*  BUN 14 15 23 22  24*  CREATININE 0.95 0.87 1.04* 0.74 0.88  CALCIUM 9.0 8.4* 8.7* 8.6* 8.5*  MG  --   --  2.3 2.4 2.2   GFR: Estimated Creatinine Clearance: 45.5 mL/min (by C-G formula based on SCr of 0.88 mg/dL). Liver Function Tests: Recent Labs  Lab 06/07/18 1450 06/11/18 0552 06/12/18 0513  AST 17 11* 12*  ALT 16 10 13   ALKPHOS 58 46 44  BILITOT 1.0 0.9 0.6  PROT 6.1* 4.9* 4.7*  ALBUMIN 3.7 2.8* 2.9*   No results for input(s): LIPASE, AMYLASE in the last 168 hours. No results for input(s): AMMONIA in the last 168 hours. Coagulation Profile: No results for input(s): INR, PROTIME in the last 168 hours. Cardiac Enzymes: Recent Labs  Lab 06/07/18 1450 06/07/18 2033 06/08/18 0101 06/08/18 0715  TROPONINI 0.17* 0.21* 0.20* 0.17*   BNP (last 3 results) No results for input(s): PROBNP in the last 8760 hours. HbA1C: No results for input(s): HGBA1C in the last 72 hours. CBG: No results for input(s): GLUCAP in the last 168 hours. Lipid Profile: No results for input(s): CHOL, HDL, LDLCALC, TRIG, CHOLHDL, LDLDIRECT in the last 72 hours. Thyroid Function Tests: No results for input(s): TSH, T4TOTAL, FREET4, T3FREE, THYROIDAB in the last 72 hours. Anemia Panel: No results for input(s): VITAMINB12, FOLATE, FERRITIN, TIBC, IRON, RETICCTPCT in the last 72 hours. Sepsis Labs: No results for input(s): PROCALCITON, LATICACIDVEN in the last 168 hours.  No results found for this or any previous visit (from the past 240 hour(s)).       Radiology Studies: No results found.      Scheduled Meds: . acetaminophen  1,000 mg Oral Q8H  . budesonide (PULMICORT) nebulizer solution  0.25 mg Nebulization BID  . cholecalciferol  1,000 Units Oral QPM  . clopidogrel  75 mg Oral Daily  . diclofenac sodium  2 g Topical QID  . docusate sodium  100 mg Oral QPM  . enoxaparin (LOVENOX) injection   40 mg Subcutaneous Q24H  . furosemide  20 mg Oral Daily  . hydrOXYzine  25 mg Oral BID  . ipratropium-albuterol  3 mL Nebulization TID  . isosorbide mononitrate  15 mg Oral Daily  . lidocaine  1 patch Transdermal Q24H  . mouth rinse  15 mL Mouth Rinse BID  . metoprolol tartrate  25 mg Oral BID  . ondansetron  4 mg Oral Q12H  . potassium chloride  10 mEq Oral Daily  . pravastatin  20 mg Oral q1800  . predniSONE  40 mg Oral Q breakfast  . traMADol  100 mg Oral Q12H  . traZODone  50 mg Oral QHS  . vitamin B-12  1,000 mcg Oral QPM  . vitamin C  500 mg Oral QPM   Continuous Infusions:   LOS: 5 days    Time spent: over 30 min    Fayrene Helper, MD [facility] Pager [phone]   If 7PM-7AM, please  contact night-coverage [email or web address] Password [facility] 06/12/2018, 5:58 PM

## 2018-06-13 ENCOUNTER — Ambulatory Visit: Payer: Medicare Other | Admitting: Emergency Medicine

## 2018-06-13 DIAGNOSIS — M545 Low back pain: Secondary | ICD-10-CM | POA: Diagnosis not present

## 2018-06-13 DIAGNOSIS — J9600 Acute respiratory failure, unspecified whether with hypoxia or hypercapnia: Secondary | ICD-10-CM | POA: Diagnosis not present

## 2018-06-13 DIAGNOSIS — H04123 Dry eye syndrome of bilateral lacrimal glands: Secondary | ICD-10-CM | POA: Diagnosis not present

## 2018-06-13 DIAGNOSIS — R52 Pain, unspecified: Secondary | ICD-10-CM | POA: Diagnosis not present

## 2018-06-13 DIAGNOSIS — J441 Chronic obstructive pulmonary disease with (acute) exacerbation: Secondary | ICD-10-CM | POA: Diagnosis not present

## 2018-06-13 DIAGNOSIS — I5031 Acute diastolic (congestive) heart failure: Secondary | ICD-10-CM | POA: Diagnosis not present

## 2018-06-13 DIAGNOSIS — M5441 Lumbago with sciatica, right side: Secondary | ICD-10-CM | POA: Diagnosis not present

## 2018-06-13 DIAGNOSIS — K5900 Constipation, unspecified: Secondary | ICD-10-CM | POA: Diagnosis not present

## 2018-06-13 DIAGNOSIS — J449 Chronic obstructive pulmonary disease, unspecified: Secondary | ICD-10-CM | POA: Diagnosis not present

## 2018-06-13 DIAGNOSIS — R062 Wheezing: Secondary | ICD-10-CM | POA: Diagnosis not present

## 2018-06-13 DIAGNOSIS — Z9981 Dependence on supplemental oxygen: Secondary | ICD-10-CM | POA: Diagnosis not present

## 2018-06-13 DIAGNOSIS — R279 Unspecified lack of coordination: Secondary | ICD-10-CM | POA: Diagnosis not present

## 2018-06-13 DIAGNOSIS — M719 Bursopathy, unspecified: Secondary | ICD-10-CM | POA: Diagnosis not present

## 2018-06-13 DIAGNOSIS — S3210XA Unspecified fracture of sacrum, initial encounter for closed fracture: Secondary | ICD-10-CM | POA: Diagnosis not present

## 2018-06-13 DIAGNOSIS — Z87891 Personal history of nicotine dependence: Secondary | ICD-10-CM | POA: Diagnosis not present

## 2018-06-13 DIAGNOSIS — Z955 Presence of coronary angioplasty implant and graft: Secondary | ICD-10-CM | POA: Diagnosis not present

## 2018-06-13 DIAGNOSIS — I10 Essential (primary) hypertension: Secondary | ICD-10-CM | POA: Diagnosis not present

## 2018-06-13 DIAGNOSIS — M21371 Foot drop, right foot: Secondary | ICD-10-CM | POA: Diagnosis not present

## 2018-06-13 DIAGNOSIS — E785 Hyperlipidemia, unspecified: Secondary | ICD-10-CM | POA: Diagnosis not present

## 2018-06-13 DIAGNOSIS — I509 Heart failure, unspecified: Secondary | ICD-10-CM | POA: Diagnosis not present

## 2018-06-13 DIAGNOSIS — M25551 Pain in right hip: Secondary | ICD-10-CM | POA: Diagnosis not present

## 2018-06-13 DIAGNOSIS — J9610 Chronic respiratory failure, unspecified whether with hypoxia or hypercapnia: Secondary | ICD-10-CM | POA: Diagnosis not present

## 2018-06-13 DIAGNOSIS — R0689 Other abnormalities of breathing: Secondary | ICD-10-CM | POA: Diagnosis not present

## 2018-06-13 DIAGNOSIS — Z96642 Presence of left artificial hip joint: Secondary | ICD-10-CM | POA: Diagnosis not present

## 2018-06-13 DIAGNOSIS — Z7902 Long term (current) use of antithrombotics/antiplatelets: Secondary | ICD-10-CM | POA: Diagnosis not present

## 2018-06-13 DIAGNOSIS — R0602 Shortness of breath: Secondary | ICD-10-CM | POA: Diagnosis not present

## 2018-06-13 DIAGNOSIS — M5489 Other dorsalgia: Secondary | ICD-10-CM | POA: Diagnosis not present

## 2018-06-13 DIAGNOSIS — I2510 Atherosclerotic heart disease of native coronary artery without angina pectoris: Secondary | ICD-10-CM | POA: Diagnosis not present

## 2018-06-13 DIAGNOSIS — J9601 Acute respiratory failure with hypoxia: Secondary | ICD-10-CM | POA: Diagnosis not present

## 2018-06-13 DIAGNOSIS — M5431 Sciatica, right side: Secondary | ICD-10-CM | POA: Diagnosis not present

## 2018-06-13 DIAGNOSIS — J9691 Respiratory failure, unspecified with hypoxia: Secondary | ICD-10-CM | POA: Diagnosis not present

## 2018-06-13 DIAGNOSIS — R112 Nausea with vomiting, unspecified: Secondary | ICD-10-CM | POA: Diagnosis not present

## 2018-06-13 DIAGNOSIS — Z79899 Other long term (current) drug therapy: Secondary | ICD-10-CM | POA: Diagnosis not present

## 2018-06-13 DIAGNOSIS — R079 Chest pain, unspecified: Secondary | ICD-10-CM | POA: Diagnosis not present

## 2018-06-13 DIAGNOSIS — Z743 Need for continuous supervision: Secondary | ICD-10-CM | POA: Diagnosis not present

## 2018-06-13 LAB — CBC
HCT: 35.2 % — ABNORMAL LOW (ref 36.0–46.0)
Hemoglobin: 11.2 g/dL — ABNORMAL LOW (ref 12.0–15.0)
MCH: 28.1 pg (ref 26.0–34.0)
MCHC: 31.8 g/dL (ref 30.0–36.0)
MCV: 88.4 fL (ref 80.0–100.0)
Platelets: 176 10*3/uL (ref 150–400)
RBC: 3.98 MIL/uL (ref 3.87–5.11)
RDW: 12.8 % (ref 11.5–15.5)
WBC: 11.1 10*3/uL — ABNORMAL HIGH (ref 4.0–10.5)
nRBC: 0 % (ref 0.0–0.2)

## 2018-06-13 LAB — BASIC METABOLIC PANEL
Anion gap: 7 (ref 5–15)
BUN: 21 mg/dL (ref 8–23)
CALCIUM: 8.3 mg/dL — AB (ref 8.9–10.3)
CO2: 32 mmol/L (ref 22–32)
Chloride: 99 mmol/L (ref 98–111)
Creatinine, Ser: 0.82 mg/dL (ref 0.44–1.00)
GFR calc Af Amer: >60 mL/min (ref >60)
GFR calc non Af Amer: >60 mL/min (ref >60)
Glucose, Bld: 100 mg/dL — ABNORMAL HIGH (ref 70–99)
Potassium: 4.7 mmol/L (ref 3.5–5.1)
SODIUM: 138 mmol/L (ref 135–145)

## 2018-06-13 LAB — MAGNESIUM: Magnesium: 2.1 mg/dL (ref 1.7–2.4)

## 2018-06-13 MED ORDER — PREDNISONE 10 MG PO TABS: 20.0000 mg | ORAL_TABLET | Freq: Every day | ORAL | 0 refills | Status: DC

## 2018-06-13 MED ORDER — LIDOCAINE 5 % EX PTCH: 1.0000 | MEDICATED_PATCH | CUTANEOUS | 0 refills | Status: DC

## 2018-06-13 MED ORDER — TRAMADOL HCL 50 MG PO TABS: 100.0000 mg | ORAL_TABLET | Freq: Two times a day (BID) | ORAL | 0 refills | Status: DC

## 2018-06-13 MED ORDER — PREDNISONE 10 MG PO TABS: ORAL_TABLET | ORAL | 0 refills | Status: AC

## 2018-06-13 NOTE — Clinical Social Work Placement (Addendum)
    Patient and family chose bed at [facility]. Room 101A  LCSW confirmed bed with facility.   LCSW faxed dc docs to facility.   RN report #: [id number]  [facility]  CLINICAL SOCIAL WORK PLACEMENT  NOTE  Date:  06/13/2018  Patient Details  Name: Alexis Higgins MRN: [id number] Date of Birth: Jun 08, 1940  Clinical Social Work is seeking post-discharge placement for this patient at the [place] level of care (*CSW will initial, date and re-position this form in  chart as items are completed):  Yes   Patient/family provided with [place] Work Department's list of facilities offering this level of care within the geographic area requested by the patient (or if unable, by the patient's family).  Yes   Patient/family informed of their freedom to choose among providers that offer the needed level of care, that participate in Medicare, Medicaid or managed care program needed by the patient, have an available bed and are willing to accept the patient.  Yes   Patient/family informed of [place]'s ownership interest in [facility] and [facility], as well as of the fact that they are under no obligation to receive care at these facilities.  PASRR submitted to EDS on       PASRR number received on 06/10/18     Existing PASRR number confirmed on       FL2 transmitted to all facilities in geographic area requested by pt/family on 06/10/18     FL2 transmitted to all facilities within larger geographic area on 06/10/18     Patient informed that his/her managed care company has contracts with or will negotiate with certain facilities, including the following:        Yes   Patient/family informed of bed offers received.  Patient chooses bed at [place], [place]     Physician recommends and patient chooses bed at      Patient to be transferred to [facility] on 06/13/18.  Patient to be transferred to facility by EMS      Patient family notified on 06/13/18 of transfer.  Name of family member notified:  Percell Miller     PHYSICIAN       Additional Comment:    _______________________________________________ Servando Snare, LCSW 06/13/2018, 1:38 PM

## 2018-06-13 NOTE — Care Management Note (Signed)
Case Management Note  Patient Details  Name: TENEA SENS MRN: [id number] Date of Birth: [birth year]  Subjective/Objective:                  discharged  Action/Plan: Patient and family chose bed at [facility]. Room 101A  Expected Discharge Date:  06/13/18               Expected Discharge Plan:  [place]  In-House Referral:  Clinical Social Work  Discharge planning Services  CM Consult  Post Acute Care Choice:    Choice offered to:     DME Arranged:    DME Agency:     HH Arranged:    [place] Agency:     Status of Service:     If discussed at [employer], dates discussed:    Additional Comments:  Leeroy Cha, RN 06/13/2018, 3:18 PM

## 2018-06-13 NOTE — Progress Notes (Signed)
Called report to [facility]. Gave report to RN

## 2018-06-13 NOTE — Discharge Summary (Signed)
Physician Discharge Summary  Alexis Higgins PJA:[id number] DOB: 04-Dec-1940 DOA: 06/07/2018  PCP: Alexis Borg, MD  Admit date: 06/07/2018 Discharge date: 06/13/2018  Time spent: 45 minutes  Recommendations for Outpatient Follow-up:  1. Follow up outpatient CBC/CMP 2. Follow up with orthopedics as an outpatient 3. Follow up as outpatient with pulm for weaning of chronic prednisone 4. Follow up as outpatient for osteoporosis treatment (bisphosphonate?) 5. Follow volume status and dose lasix as needed 6. Follow up with cardiology as outpatient    Discharge Diagnoses:  Principal Problem:   Acute respiratory failure with hypoxia ([place]) Active Problems:   Hyperlipidemia   Essential hypertension   CAD, NATIVE VESSEL   COPD (chronic obstructive pulmonary disease) with emphysema ([facility])   Right hip pain   Discharge Condition: stable  Diet recommendation: heart healthy  Filed Weights   06/09/18 0539 06/11/18 0449 06/13/18 0500  Weight: 53.5 kg 53 kg 52.8 kg    History of present illness:  78 year old with history of COPD diastolic dysfunction-grade 2, essential hypertension, COPD on 2 L chronically, CAD status post stent, peripheral vascular disease, hyperlipidemia came to the hospital with complaints of increasing shortness of breath since her discharge about 10 days ago. Upon admission she was diagnosed with both COPD and CHF exacerbation.  Patient was admitted for shortness of breath.  She was thought to have Alexis Higgins COPD and heart failure exacerbation.  This was preceded by severe right hip pain.  She had imaging notable for Alexis Higgins stress fracture in the right sacral ala.  She's improved with nebs, steroids, lasix as well as pain control.  She's being discharged to SNF for rehab.  Hospital Course:  Acute respiratory distress with hypoxia, multifactorial, improving - improving, pt currently on her home O2  Acute exacerbation of mild persistent COPD - Continue scheduled and as needed  nebulizers, budesonide - No wheezing appreciated on exam, will taper steroids (start to taper here) and then taper to her previous dose - as outpatient will need to consider further taper especially in setting of her fracture and osteoporosis below - Incentive spirometry. Flutter valve as needed -Supplemental oxygen. At home she is on 2 L nasal cannula all the time.  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  Suspect demand ischemia in setting of above. Continue home metoprolol, Imdur, Plavix, statin and nitro - follow up with cardiology as outpatient   Right hip pain due to stress fracture of right sacral alla - Previous provider, Alexis Higgins discussed it with Dr. Sharol Higgins from orthopedic who recommended weightbearing as tolerated in physical therapy. Pain control.  - Pt to be discharged to SNF - Continue home tramadol, schedule apap, lidocaine patch, voltaren gel - would recommend treatment for osteoporosis as outpatient, consider bisphosphonate therapy?  Essential hypertension -Continue metoprolol  Procedures:  none  Consultations:  Orthopedics over phone  Discharge Exam: Vitals:   06/13/18 1005 06/13/18 1045  BP:    Pulse: 79 73  Resp:  18  Temp:    SpO2:  99%   Feeling well.  Ready for discharge today. Husband at bedside. Discussed follow up plan.  General: No acute distress. Cardiovascular: Heart sounds show Alexis Higgins regular rate, and rhythm.  Lungs: Clear to auscultation bilaterally Abdomen: Soft, nontender, nondistended  TTP in region of R buttocks Neurological: Alert and oriented 3. Moves all  extremities 4. Cranial nerves II through XII grossly intact. Skin: Warm and dry. No rashes or lesions. Extremities: No clubbing or cyanosis. No edema. Pedal pulses  2+. Psychiatric: Mood and affect are normal. Insight and judgment are appropriate.  Discharge Instructions   Discharge Instructions    Call MD for:  difficulty breathing, headache or visual disturbances   Complete by:  As directed    Call MD for:  extreme fatigue   Complete by:  As directed    Call MD for:  hives   Complete by:  As directed    Call MD for:  persistant dizziness or light-headedness   Complete by:  As directed    Call MD for:  persistant nausea and vomiting   Complete by:  As directed    Call MD for:  redness, tenderness, or signs of infection (pain, swelling, redness, odor or green/yellow discharge around incision site)   Complete by:  As directed    Call MD for:  severe uncontrolled pain   Complete by:  As directed    Call MD for:  temperature >100.4   Complete by:  As directed    Diet - low sodium heart healthy   Complete by:  As directed    Diet - low sodium heart healthy   Complete by:  As directed    Discharge instructions   Complete by:  As directed    You were seen for pain from Alexis Higgins stress fracture of the right sacral ala.  You also were treated for Alexis Higgins COPD and heart failure exacerbation.  For the sacral ala fracture, continue physical therapy and tylenol, voltaren, lidocaine patch for pain control.  Follow up with orthopedics as an outpatient.  Please discuss treatment for osteoporosis with your PCP.  You've improved and are back to your baseline from Alexis Higgins breathing standpoint.  We'll send you home on Alexis Higgins steroid taper.  Please continue to discuss tapering your steroids with your primary doctor.  You should probably also be on treatment for your osteoporosis.  Please follow this up with your PCP.  Return for new, recurrent, or worsening symptoms.  Please ask your PCP to request records from this hospitalization so they know what was done and what the next steps will be.   Increase activity slowly   Complete by:  As directed    Increase activity slowly    Complete by:  As directed      Allergies as of 06/13/2018      Reactions   Aspirin Other (See Comments)    cns bleed risk   Codeine Anaphylaxis, Rash   Other Other (See Comments)   Stiolto - severe reaction - caused inability to breath      Medication List    TAKE these medications   acetaminophen 500 MG tablet Commonly known as:  TYLENOL Take 1,000 mg by mouth every 6 (six) hours as needed for moderate pain or headache.   clopidogrel 75 MG tablet Commonly known as:  PLAVIX TAKE 1 TABLET BY MOUTH EVERY DAY   diclofenac sodium 1 % Gel Commonly known as:  VOLTAREN APPLY 4 GRAMS TOPICALLY 4 TIMES DAILY What changed:  See the new instructions.   docusate sodium 100 MG capsule Commonly known as:  COLACE Take 100 mg by mouth every evening.   furosemide 20 MG tablet Commonly known as:  LASIX Take 1 tablet (20 mg total) by mouth 2 (two) times daily as needed.   hydrOXYzine 25 MG tablet Commonly known as:  ATARAX/VISTARIL TAKE 1 OR 2 TABLETS BY MOUTH EVERY 8 HOURS AS NEEDED FOR ANXIETY What changed:    how much to take  how to take this  when to take this  additional instructions   isosorbide mononitrate 30 MG 24 hr tablet Commonly known as:  IMDUR TAKE 0.5 TABLETS (15 MG TOTAL) BY MOUTH DAILY. What changed:  See the new instructions.   lidocaine 5 % Commonly known as:  LIDODERM Place 1 patch onto the skin daily. Remove & Discard patch within 12 hours or as directed by MD   lovastatin 20 MG tablet Commonly known as:  MEVACOR TAKE 20 MG BY MOUTH AT BEDTIME What changed:    how much to take  how to take this  when to take this  additional instructions   metoprolol tartrate 25 MG tablet Commonly known as:  LOPRESSOR TAKE 1 TABLET BY MOUTH TWICE Brietta Manso DAY NEEDS APPT What changed:  See the new instructions.   nitroGLYCERIN 0.4 MG SL tablet Commonly known as:  NITROSTAT Place 1 tablet (0.4 mg total) under the tongue every 5 (five) minutes as needed for chest  pain (up to 3 doses).   ondansetron 4 MG tablet Commonly known as:  ZOFRAN Take 1 tablet (4 mg total) by mouth every 8 (eight) hours as needed for nausea or vomiting. What changed:  when to take this   OXYGEN Inhale 2 L into the lungs continuous.   polyethylene glycol packet Commonly known as:  MIRALAX / GLYCOLAX Take 17 g by mouth daily as needed for mild constipation.   polyvinyl alcohol 1.4 % ophthalmic solution Commonly known as:  LIQUIFILM TEARS Place 1 drop into both eyes daily as needed for dry eyes.   potassium chloride 10 MEQ tablet Commonly known as:  K-DUR Take 1 tablet (10 mEq total) by mouth daily.   predniSONE 10 MG tablet Commonly known as:  DELTASONE Take 2 tablets (20 mg total) by mouth daily with breakfast for 30 days. (resume after taper complete) What changed:    how much to take  how to take this  when to take this  additional instructions   predniSONE 10 MG tablet Commonly known as:  DELTASONE Take 3 tablets (30 mg total) by mouth daily with breakfast for 2 days, THEN 2 tablets (20 mg total) daily with breakfast for 2 days. And resume prior to admission dose of 20 mg daily (please continue to taper this with your pulmonologist and PCP). Start taking on:  June 14, 2018 What changed:  You were already taking Odus Clasby medication with the same name, and this prescription was added. Make sure you understand how and when to take each.   PROAIR HFA 108 (90 Base) MCG/ACT inhaler Generic drug:  albuterol INHALE 2 PUFFS INTO THE LUNGS EVERY 6 (SIX) HOURS AS NEEDED FOR WHEEZING OR SHORTNESS OF BREATH.   tiotropium 18 MCG inhalation capsule Commonly known as:  SPIRIVA HANDIHALER Place 1 capsule (18 mcg total) into inhaler and inhale daily at 6 (six) AM.   traMADol 50 MG tablet Commonly known as:  ULTRAM Take 2 tablets (100 mg total) by mouth 2 (two) times daily.   traZODone 50 MG tablet Commonly known as:  DESYREL Take 50 mg by mouth at bedtime.    vitamin B-12 1000 MCG tablet Commonly known as:  CYANOCOBALAMIN Take 1,000 mcg by mouth every evening.   vitamin C 500 MG tablet Commonly known as:  ASCORBIC ACID Take 500 mg by mouth every evening.  Vitamin D 1000 units capsule Take 1,000 Units by mouth every evening.      Allergies  Allergen Reactions  . Aspirin Other (See Comments)     cns bleed risk  . Codeine Anaphylaxis and Rash  . Other Other (See Comments)    Stiolto - severe reaction - caused inability to breath   Follow-up Information    Gardiner Barefoot, DPM Follow up.   Specialty:  Podiatry Contact information: [address] [place] [address] [phone]        Collene Gobble, MD .   Specialty:  Pulmonary Disease Contact information: [place] [address] [phone]            The results of significant diagnostics from this hospitalization (including imaging, microbiology, ancillary and laboratory) are listed below for reference.    Significant Diagnostic Studies: Dg Chest 2 View  Result Date: 05/29/2018 CLINICAL DATA:  Cough EXAM: CHEST - 2 VIEW COMPARISON:  05/28/2018 FINDINGS: Artifact from calcified breast implants. Normal heart size.  Coronary stent is noted. Chronic hyperinflation and interstitial coarsening. There is chronic blunting of posterior costophrenic sulci. There is no edema, consolidation, effusion, or pneumothorax. Remote left rib fractures. Remote compression fractures and cement augmentation. IMPRESSION: COPD without acute superimposed finding. Electronically Signed   By: Monte Fantasia M.D.   On: 05/29/2018 09:22   Dg Chest 2 View  Result Date: 05/28/2018 CLINICAL DATA:  Shortness of breath. EXAM: CHEST - 2 VIEW COMPARISON:  03/22/2018 FINDINGS: Normal heart size. No pleural effusion or edema identified. No airspace opacities identified. Bilateral breast implants with capsular calcification noted. The bones are diffusely osteopenic.  Multiple thoracic vertebral compression deformities are identified which have been treated with bone cement. IMPRESSION: No active cardiopulmonary disease. Electronically Signed   By: Kerby Moors M.D.   On: 05/28/2018 11:43   Ct Angio Chest Pe W Or Wo Contrast  Result Date: 05/29/2018 CLINICAL DATA:  Shortness of breath and fever.  Chronic COPD EXAM: CT ANGIOGRAPHY CHEST WITH CONTRAST TECHNIQUE: Multidetector CT imaging of the chest was performed using the standard protocol during bolus administration of intravenous contrast. Multiplanar CT image reconstructions and MIPs were obtained to evaluate the vascular anatomy. CONTRAST:  157mL ISOVUE-370 IOPAMIDOL (ISOVUE-370) INJECTION 76% COMPARISON:  CT angiogram chest December 25, 2016; chest radiograph May 29, 2018 FINDINGS: Cardiovascular: There is no demonstrable pulmonary embolus. There is no thoracic aortic aneurysm or dissection. The visualized great vessels appear unremarkable. There are foci of aortic atherosclerosis. There are foci of coronary artery calcification. There is no pericardial effusion or pericardial thickening. There is stable appearing prominence of the main pulmonary arteries with rather rapid peripheral tapering. Mediastinum/Nodes: There are stable benign-appearing calcifications in the thyroid. No dominant thyroid mass evident. There is no appreciable thoracic adenopathy. No esophageal lesions are demonstrable. Lungs/Pleura: There is diffuse centrilobular type emphysematous change with scattered upper lobe bullous changes. There is atelectatic change in the lung bases, more pronounced on the left than on the right. There is no frank edema or consolidation. There is Bernal Luhman degree of lower lobe bronchiectatic change, slightly more pronounced than on prior study. No pleural effusion evident. There is mild posterior basilar benign appearing pleural thickening. Upper Abdomen: There is aortic atherosclerosis in the upper abdominal region. Visualized  upper abdominal structures otherwise appear unremarkable. Musculoskeletal: There are breast implants bilaterally, with peripheral calcification bilaterally. Patient is undergone multiple kyphoplasty procedures throughout the thoracic spine. The bones are osteoporotic. There  are no destructive appearing bone lesions. No chest wall lesions are evident. Review of the MIP images confirms the above findings. IMPRESSION: 1. No demonstrable pulmonary embolus. No thoracic aortic aneurysm or dissection. There is aortic atherosclerosis as well as foci of coronary artery calcification. 2. Central pulmonary arterial prominence may indicate Roiza Wiedel degree of pulmonary arterial hypertension. This finding is similar to prior CT examination. 3. Underlying emphysematous change. Atelectatic change in the bases, more on the left than on the right. Benign bibasilar pleural thickening. There is Angeline Trick degree of lower lobe bronchiectatic change. No frank consolidation or edema. 4.  No appreciable thoracic adenopathy. 5. Bones osteoporotic. Status post multiple kyphoplasty procedures, also noted previously. Aortic Atherosclerosis (ICD10-I70.0) and Emphysema (ICD10-J43.9). Electronically Signed   By: Lowella Grip III M.D.   On: 05/29/2018 14:53   Mr Hip Right Wo Contrast  Result Date: 06/08/2018 CLINICAL DATA:  Right hip pain starting 2 days prior to imaging. EXAM: MR OF THE RIGHT HIP WITHOUT CONTRAST TECHNIQUE: Multiplanar, multisequence MR imaging was performed. No intravenous contrast was administered. COMPARISON:  Multiple exams, including 06/07/2018 FINDINGS: Metal artifact from the patient's left hip prosthesis causes field heterogeneity. Bones: Vertically oriented stress fracture in the right sacral ala compatible with insufficiency fracture. Similar findings are not well appreciated in the left sacral ala although the field heterogeneity from the left hip prosthesis may reduce sensitivity. Eston Heslin do not see definite fractures of the pubic  bones. Lower lumbar spondylosis and degenerative disc disease. No fracture of the right proximal femur is identified. Articular cartilage and labrum Articular cartilage: Mild axial narrowing of the articular cartilage thickness. Labrum:  No paralabral cyst, otherwise indeterminate. Joint or bursal effusion Joint effusion:  Small right hip joint effusion is present. Bursae: No regional bursitis Muscles and tendons Muscles and tendons: Low-level edema tracks within along the right hip adductor musculature example on image 17/5. There is also edema signal tracking around the right iliotibial band. Other findings Miscellaneous: Subcutaneous edema superficial to the tensor fascia lata muscle on the right. IMPRESSION: 1. Stress fracture oriented vertically in the right sacral ala compatible with insufficiency fracture. 2. Lower lumbar spondylosis and degenerative disc disease. Mild degenerative chondral thinning in the right hip joint. Small right hip joint effusion. 3. Low-level edema tracks in the subcutaneous tissues superficial to the tensor fascia lata muscle, and also along the right iliotibial band. 4. Mild edema in the right hip adductor musculature, etiology uncertain. 5. There are some reduction in sensitivity and specificity due to field heterogeneity related to the left hip prosthesis. Electronically Signed   By: Van Clines M.D.   On: 06/08/2018 08:10   Dg Chest Port 1 View  Result Date: 06/07/2018 CLINICAL DATA:  Shortness of breath and right hip pain. No known injury. History of COPD. EXAM: PORTABLE CHEST 1 VIEW COMPARISON:  CT and radiographs 05/29/2018. FINDINGS: 1657 hours. The heart size and mediastinal contours are stable. The lungs are hyperinflated with stable diffuse emphysematous changes. No superimposed airspace disease, pneumothorax or significant pleural effusion. Old rib fractures on the left, previous multilevel spinal augmentation and bilateral breast implants are noted.  IMPRESSION: Stable radiographic appearance of the chest without acute findings. Chronic obstructive pulmonary disease. Electronically Signed   By: Richardean Sale M.D.   On: 06/07/2018 17:14   Dg Hip Unilat W Or Wo Pelvis 2-3 Views Right  Result Date: 06/07/2018 CLINICAL DATA:  78 year old female with right hip pain for the past 3 days. No acute injury. EXAM: DG HIP (  WITH OR WITHOUT PELVIS) 2-3V RIGHT COMPARISON:  None. FINDINGS: Prior left hip arthroplasty without evidence of hardware complication. Moderate degenerative changes are present at the right hip including narrowing of the medial joint space, subchondral sclerosis and osteophyte formation. The bones appear diffusely demineralized. No evidence of acute fracture or malalignment. IMPRESSION: 1. Moderate right hip joint osteoarthritis. 2. Left hip arthroplasty without evidence of hardware complication. 3. The bones appear generally demineralized concerning for osteoporosis. Electronically Signed   By: Jacqulynn Cadet M.D.   On: 06/07/2018 17:08   Vas [place] Lower Extremity Venous (dvt)  Result Date: 05/29/2018  Lower Venous Study Indications: Edema, and elevated D-dimer.  Limitations: Body habitus and immobility. Comparison Study: Lower extremity venous duplex exam on 03/16/2018 Performing Technologist: Rudell Cobb  Examination Guidelines: Lilymarie Scroggins complete evaluation includes B-mode imaging, spectral Doppler, color Doppler, and power Doppler as needed of all accessible portions of each vessel. Bilateral testing is considered an integral part of Radie Berges complete examination. Limited examinations for reoccurring indications may be performed as noted.  Right Venous Findings: +---------+---------------+---------+-----------+----------+-------------------+          CompressibilityPhasicitySpontaneityPropertiesSummary             +---------+---------------+---------+-----------+----------+-------------------+ CFV      Full           Yes      Yes                                       +---------+---------------+---------+-----------+----------+-------------------+ SFJ      Full                                                             +---------+---------------+---------+-----------+----------+-------------------+ FV Prox  Full                                                             +---------+---------------+---------+-----------+----------+-------------------+ FV Mid   Full                                                             +---------+---------------+---------+-----------+----------+-------------------+ FV DistalFull                                         dilated segment     +---------+---------------+---------+-----------+----------+-------------------+ PFV      Full                                                             +---------+---------------+---------+-----------+----------+-------------------+ POP      Full  Yes      Yes                                      +---------+---------------+---------+-----------+----------+-------------------+ PTV      Full                    Yes                  patent, some                                                              segments are not                                                          well visualized                                                           with compression    +---------+---------------+---------+-----------+----------+-------------------+ PERO     Full                                         some segments are                                                         not well visualized                                                       with compression    +---------+---------------+---------+-----------+----------+-------------------+  Left Venous Findings: +---------+---------------+---------+-----------+----------+-------------------+           CompressibilityPhasicitySpontaneityPropertiesSummary             +---------+---------------+---------+-----------+----------+-------------------+ CFV      Full           Yes      Yes                                      +---------+---------------+---------+-----------+----------+-------------------+ SFJ      Full                                                             +---------+---------------+---------+-----------+----------+-------------------+  FV Prox  Full                                                             +---------+---------------+---------+-----------+----------+-------------------+ FV Mid   Full                                                             +---------+---------------+---------+-----------+----------+-------------------+ FV DistalFull                                         poor flow           +---------+---------------+---------+-----------+----------+-------------------+ PFV      Full                                                             +---------+---------------+---------+-----------+----------+-------------------+ POP      Full           Yes      Yes                                      +---------+---------------+---------+-----------+----------+-------------------+ PTV                              Yes                  patent, some                                                              segments are not                                                          well visualized                                                           with compression    +---------+---------------+---------+-----------+----------+-------------------+ PERO  some segments are                                                         not well visualized                                                       with compression     +---------+---------------+---------+-----------+----------+-------------------+    Summary: Right: There is no evidence of deep vein thrombosis in the lower extremity. However, portions of this examination were limited- see technologist comments above. No cystic structure found in the popliteal fossa. Left: There is no evidence of deep vein thrombosis in the lower extremity. However, portions of this examination were limited- see technologist comments above. No cystic structure found in the popliteal fossa.  *See table(s) above for measurements and observations. Electronically signed by Monica Martinez MD on 05/29/2018 at 2:05:37 PM.    Final     Microbiology: No results found for this or any previous visit (from the past 240 hour(s)).   Labs: Basic Metabolic Panel: Recent Labs  Lab 06/08/18 0459 06/09/18 0547 06/11/18 0552 06/12/18 0513 06/13/18 0538  NA 141 141 142 139 138  K 4.6 3.6 3.9 4.0 4.7  CL 98 96* 99 98 99  CO2 35* 34* 33* 36* 32  GLUCOSE 119* 83 89 103* 100*  BUN 15 23 22  24* 21  CREATININE 0.87 1.04* 0.74 0.88 0.82  CALCIUM 8.4* 8.7* 8.6* 8.5* 8.3*  MG  --  2.3 2.4 2.2 2.1   Liver Function Tests: Recent Labs  Lab 06/07/18 1450 06/11/18 0552 06/12/18 0513  AST 17 11* 12*  ALT 16 10 13   ALKPHOS 58 46 44  BILITOT 1.0 0.9 0.6  PROT 6.1* 4.9* 4.7*  ALBUMIN 3.7 2.8* 2.9*   No results for input(s): LIPASE, AMYLASE in the last 168 hours. No results for input(s): AMMONIA in the last 168 hours. CBC: Recent Labs  Lab 06/07/18 1450 06/08/18 0459 06/11/18 0552 06/12/18 0513 06/13/18 0715  WBC 18.0* 14.6* 12.7* 11.9* 11.1*  HGB 13.6 12.3 12.1 11.4* 11.2*  HCT 44.7 39.5 39.7 37.3 35.2*  MCV 91.8 91.2 91.5 91.4 88.4  PLT 244 237 204 199 176   Cardiac Enzymes: Recent Labs  Lab 06/07/18 1450 06/07/18 2033 06/08/18 0101 06/08/18 0715  TROPONINI 0.17* 0.21* 0.20* 0.17*   BNP: BNP (last 3 results) Recent Labs    06/07/18 1450  BNP 118.6*    ProBNP (last  3 results) No results for input(s): PROBNP in the last 8760 hours.  CBG: No results for input(s): GLUCAP in the last 168 hours.     Signed:  Fayrene Helper MD.  [facility] 06/13/2018, 1:21 PM

## 2018-06-13 NOTE — Telephone Encounter (Signed)
Thank you very much for letting me know ?

## 2018-06-14 ENCOUNTER — Telehealth: Payer: Self-pay | Admitting: *Deleted

## 2018-06-14 ENCOUNTER — Ambulatory Visit: Payer: Medicare Other | Admitting: Podiatry

## 2018-06-14 NOTE — Telephone Encounter (Signed)
Pt was on TCM report admitted 06/07/18 for shortness of breath.  She was thought to have a COPD and heart failure exacerbation.  This was preceded by severe right hip pain.  She had imaging notable for a stress fracture in the right sacral ala. Pt D/C 06/13/18 to SNF. Per summary will need to follow-up w/pulmomologist after discharge from SNF,,,/lmb

## 2018-06-15 ENCOUNTER — Telehealth: Payer: Self-pay | Admitting: Emergency Medicine

## 2018-06-15 NOTE — Telephone Encounter (Signed)
Called and spoke with Alexis Higgins, Husband. He is requesting a appointment with RB.  Hospital follow up scheduled for 06/28/18, at 3:45pm.

## 2018-06-24 ENCOUNTER — Encounter (HOSPITAL_COMMUNITY): Payer: Self-pay

## 2018-06-24 ENCOUNTER — Emergency Department (HOSPITAL_COMMUNITY): Payer: Medicare Other

## 2018-06-24 ENCOUNTER — Emergency Department (HOSPITAL_COMMUNITY)
Admission: EM | Admit: 2018-06-24 | Discharge: 2018-06-24 | Payer: Medicare Other | Attending: Emergency Medicine | Admitting: Emergency Medicine

## 2018-06-24 DIAGNOSIS — M5431 Sciatica, right side: Secondary | ICD-10-CM | POA: Diagnosis not present

## 2018-06-24 DIAGNOSIS — Z96642 Presence of left artificial hip joint: Secondary | ICD-10-CM | POA: Insufficient documentation

## 2018-06-24 DIAGNOSIS — Z79899 Other long term (current) drug therapy: Secondary | ICD-10-CM | POA: Insufficient documentation

## 2018-06-24 DIAGNOSIS — I2510 Atherosclerotic heart disease of native coronary artery without angina pectoris: Secondary | ICD-10-CM | POA: Diagnosis not present

## 2018-06-24 DIAGNOSIS — Z7902 Long term (current) use of antithrombotics/antiplatelets: Secondary | ICD-10-CM | POA: Diagnosis not present

## 2018-06-24 DIAGNOSIS — Z955 Presence of coronary angioplasty implant and graft: Secondary | ICD-10-CM | POA: Diagnosis not present

## 2018-06-24 DIAGNOSIS — R079 Chest pain, unspecified: Secondary | ICD-10-CM | POA: Diagnosis not present

## 2018-06-24 DIAGNOSIS — Z87891 Personal history of nicotine dependence: Secondary | ICD-10-CM | POA: Diagnosis not present

## 2018-06-24 DIAGNOSIS — J449 Chronic obstructive pulmonary disease, unspecified: Secondary | ICD-10-CM | POA: Diagnosis not present

## 2018-06-24 DIAGNOSIS — R0602 Shortness of breath: Secondary | ICD-10-CM | POA: Diagnosis not present

## 2018-06-24 DIAGNOSIS — I10 Essential (primary) hypertension: Secondary | ICD-10-CM | POA: Diagnosis not present

## 2018-06-24 DIAGNOSIS — M5441 Lumbago with sciatica, right side: Secondary | ICD-10-CM

## 2018-06-24 LAB — CBC WITH DIFFERENTIAL/PLATELET
Abs Immature Granulocytes: 0.12 10*3/uL — ABNORMAL HIGH (ref 0.00–0.07)
Basophils Absolute: 0 10*3/uL (ref 0.0–0.1)
Basophils Relative: 0 %
Eosinophils Absolute: 0 10*3/uL (ref 0.0–0.5)
Eosinophils Relative: 0 %
HCT: 38.3 % (ref 36.0–46.0)
Hemoglobin: 11.6 g/dL — ABNORMAL LOW (ref 12.0–15.0)
Immature Granulocytes: 1 %
LYMPHS PCT: 8 %
Lymphs Abs: 0.8 10*3/uL (ref 0.7–4.0)
MCH: 27 pg (ref 26.0–34.0)
MCHC: 30.3 g/dL (ref 30.0–36.0)
MCV: 89.3 fL (ref 80.0–100.0)
Monocytes Absolute: 0.5 10*3/uL (ref 0.1–1.0)
Monocytes Relative: 5 %
Neutro Abs: 9.4 10*3/uL — ABNORMAL HIGH (ref 1.7–7.7)
Neutrophils Relative %: 86 %
Platelets: 191 10*3/uL (ref 150–400)
RBC: 4.29 MIL/uL (ref 3.87–5.11)
RDW: 13.5 % (ref 11.5–15.5)
WBC: 10.9 10*3/uL — ABNORMAL HIGH (ref 4.0–10.5)
nRBC: 0 % (ref 0.0–0.2)

## 2018-06-24 LAB — BASIC METABOLIC PANEL
Anion gap: 9 (ref 5–15)
BUN: 15 mg/dL (ref 8–23)
CO2: 35 mmol/L — ABNORMAL HIGH (ref 22–32)
Calcium: 8.6 mg/dL — ABNORMAL LOW (ref 8.9–10.3)
Chloride: 98 mmol/L (ref 98–111)
Creatinine, Ser: 0.97 mg/dL (ref 0.44–1.00)
GFR calc Af Amer: >60 mL/min (ref >60)
GFR calc non Af Amer: 57 mL/min — ABNORMAL LOW (ref >60)
Glucose, Bld: 119 mg/dL — ABNORMAL HIGH (ref 70–99)
Potassium: 4.2 mmol/L (ref 3.5–5.1)
Sodium: 142 mmol/L (ref 135–145)

## 2018-06-24 MED ORDER — OXYCODONE HCL 5 MG PO TABS
5.0000 mg | ORAL_TABLET | Freq: Once | ORAL | Status: AC
  Administered 2018-06-24: 5 mg via ORAL
  Filled 2018-06-24: qty 1

## 2018-06-24 MED ORDER — LIDOCAINE 5 % EX PTCH
1.0000 | MEDICATED_PATCH | CUTANEOUS | Status: DC
  Administered 2018-06-24: 1 via TRANSDERMAL
  Filled 2018-06-24: qty 1

## 2018-06-24 NOTE — Discharge Instructions (Signed)
You were seen today for acute pain related to recent diagnosis of sacral ala fracture.  Your oxygen levels have been normal in the emergency department, and you have had no shortness of breath.  The episode of difficulty breathing you are describing was in association with severe pain.  Your chest XR is normal and you have no signs of pneumonia, current COPD exacerbation or collapsed lung.   I recommend that your medications for pain be scheduled over the next week, rather than as needed, in order for you to stay on top of your acute pain from this fracture.  If you are not having sedation with your pain medications and having severe pain, it is ok for you to take the tramadol and 5-325 vicodin at the same time.   Please follow up closely with your pain management physician and PCP. If you develop other shortness of breath or concerns, please return to the ED.

## 2018-06-24 NOTE — ED Triage Notes (Addendum)
Pt coming from [place] home via ems; pt has lumbar spinal stress fx right side per husband that was discovered last month ia MRI; c/o lower back pain 8/10; lives on 2 L o2 all the time; increased from 2 to 4 L within last day d/t pt not maintaining O2 sats; 136/70, HR 70, 16 RR; per husband; pt A and O x 4; pt's husband she took 2 Vicodin around 0840 this am

## 2018-06-24 NOTE — ED Provider Notes (Signed)
[facility] EMERGENCY DEPARTMENT Provider Note   CSN: [id number] Arrival date & time: 06/24/18  1303     History   Chief Complaint Chief Complaint  Patient presents with  . Back Pain    HPI Alexis Higgins is a 78 y.o. female.  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  Patient has had increasing difficulty controlling pain related to her fracture over the past week.  Current regimen includes Tylenol, topical diclofenac, Lidoderm patches, tramadol.  Patient has been lying in bed for the past 2 days.  Her facility Dr. ordered Vicodin which she took at approximately 840am today.  A couple hours later she was found to have low oxygen levels requiring her O2 to be increased to 4 to 5 L.  This slowly helped, patient stabilized to 3 L.  Upon arrival to the emergency department she is satting in the mid 90s on 2 L she complains of pain in her sacral area with movement and palpation.  She was sent because of her history of COPD that flares up.  Patient is maintained on chronic prednisone.  She denies any worsening cough or sputum production.  No fevers or URI symptoms.  No vomiting or diarrhea.  The onset of this condition was acute. The course is constant. Aggravating factors: none.       Past Medical History:  Diagnosis Date  . ANXIETY 01/01/2007  . Blood transfusion without reported diagnosis 2015  . BURSITIS, RIGHT HIP 06/04/2009  . CAD (coronary artery disease)    a. BMS to LAD 2010. b. NSTEMI with DES to LAD for ISR 2011. c. Patent stent 03/2012/Imdur added.  . Cataract   . Colon polyps    H/o tubular adenoma of colon  . COPD 01/01/2007   a. Chronic resp failure on home O2.  Marland Kitchen DEPRESSION 01/01/2007  . Eczema 01/08/2011  . GROIN PAIN 06/20/2008  . Headache(784.0)  01/01/2007  . Hemorrhoid   . HYPERTENSION 01/01/2007  . Impaired glucose tolerance 01/07/2011  . LOW BACK PAIN 01/01/2007  . Muscle weakness (generalized) 06/04/2009  . On home oxygen therapy    "2L; 24/7" (03/21/2018)  . OSTEOARTHRITIS, HIP 07/01/2008  . OSTEOPOROSIS 01/01/2007  . Pneumonia 1998  . Rosacea 01/08/2011  . SYNCOPE 01/01/2007  . Thoracic compression fracture ([place])   . TRANSIENT ISCHEMIC ATTACK, HX OF 01/01/2007    Patient Active Problem List   Diagnosis Date Noted  . Acute respiratory failure with hypoxia ([place]) 06/07/2018  . Right hip pain 06/07/2018  . Elevated troponin   . COPD exacerbation ([place]) 05/28/2018  . Lymphedema 03/21/2018  . Left upper quadrant pain 12/21/2017  . 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 Insect bite, sequela 09/21/2017  . Hypokalemia 08/15/2017  . On home O2 07/27/2017  . Itching 07/08/2017  . Skin lesion 07/08/2017  . Ingrown nail 07/08/2017  . Back pain 10/06/2016  . Fracture of seventh thoracic vertebra ([place]) 10/06/2016  . Non-traumatic compression fracture of T7 thoracic vertebra   . Thoracic compression fracture ([place])   . Intractable back pain 09/10/2016  . Closed compression fracture of thoracic vertebra ([place])   . 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 Peripheral edema 06/15/2016  . Smoker 08/02/2014  . Peripheral vascular disease ([place]) 08/02/2014  . Abdominal pain, epigastric 11/22/2013  . Black stools 11/22/2013  .  Displaced fracture of left femoral neck ([place]) 09/30/2013  . Fracture of femoral neck, left, closed ([place]) 09/30/2013  . Fall at home 09/30/2013  . Head contusion 09/30/2013  . Leg weakness, bilateral 07/21/2012  . 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 Unstable angina pectoris ([place]) 04/12/2012  . Chronic respiratory failure ([place]) 02/13/2012  . Localized swelling, mass and lump, neck 01/06/2012  . CAD (coronary artery disease) 12/14/2011  . Dyspnea 11/22/2011  . Personal history of colonic polyps  09/06/2011  . Eczema 01/08/2011  . Rosacea 01/08/2011  . URI (upper respiratory infection) 01/08/2011  . Dizziness - light-headed 01/08/2011  . Encounter for long-term (current) use of high-risk medication 01/08/2011  . Impaired glucose tolerance 01/07/2011  . Preventative health care 01/07/2011  . BURSITIS, RIGHT HIP 06/04/2009  . CAD, NATIVE VESSEL 01/15/2009  . Other symptoms involving cardiovascular system 01/15/2009  . CHEST PAIN-PRECORDIAL 01/15/2009  . OSTEOARTHRITIS, HIP 07/01/2008  . Hyperlipidemia 01/01/2007  . Anxiety state 01/01/2007  . 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 Essential hypertension 01/01/2007  . 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 LOW BACK PAIN 01/01/2007  . Osteoporosis 01/01/2007  . SYNCOPE 01/01/2007  . Headache(784.0) 01/01/2007  . TRANSIENT ISCHEMIC ATTACK, HX OF 01/01/2007    Past Surgical History:  Procedure Laterality Date  . ABDOMINAL HYSTERECTOMY    . APPENDECTOMY    . BACK SURGERY    . BREAST ENHANCEMENT SURGERY    . COLONOSCOPY  01/25/2002   tubular adenoma,hemorrhoids, hyperplastic  colon polyps  . COLONOSCOPY  02/17/2005   hemorrhoids  . CORONARY ANGIOPLASTY    . CORONARY STENT PLACEMENT    . ESOPHAGOGASTRODUODENOSCOPY (EGD) WITH PROPOFOL N/A 08/10/2017   Procedure: ESOPHAGOGASTRODUODENOSCOPY (EGD) WITH PROPOFOL;  Surgeon: Gatha Mayer, MD;  Location: [facility];  Service: Endoscopy;  Laterality: N/A;  . HIP ARTHROPLASTY Left 10/01/2013   Procedure: ARTHROPLASTY UNIPOLAR   HIP;  Surgeon: Marianna Payment, MD;  Location: [facility];  Service: Orthopedics;  Laterality: Left;  . HIP SURGERY Left    DR XU     PROXIMAL NECK   . IR GENERIC HISTORICAL  02/19/2016   IR VERTEBROPLASTY CERV/THOR BX INC UNI/BIL INC/INJECT/IMAGING 02/19/2016 Luanne Bras, MD [facility]  . IR GENERIC HISTORICAL  07/15/2016   IR KYPHO THORACIC WITH BONE BIOPSY 07/15/2016 Luanne Bras, MD [facility]  . IR  KYPHO EA ADDL LEVEL THORACIC OR LUMBAR  09/14/2016  . IR RADIOLOGIST EVAL & MGMT  09/22/2016  . IR VERTEBROPLASTY CERV/THOR BX INC UNI/BIL INC/INJECT/IMAGING  10/08/2016  . IR VERTEBROPLASTY CERV/THOR BX INC UNI/BIL INC/INJECT/IMAGING  11/16/2016  . KYPHOPLASTY  12/2015-10/2016 X 5   "?2lumbar & 3 thoracic"  . LEFT HEART CATHETERIZATION WITH CORONARY ANGIOGRAM N/A 04/13/2012   Procedure: LEFT HEART CATHETERIZATION WITH CORONARY ANGIOGRAM;  Surgeon: Burnell Blanks, MD;  Location: [facility] CATH LAB;  Service: Cardiovascular;  Laterality: N/A;  . OOPHORECTOMY     one ovary  . s/p bilat cataract  2010  . sp lumbar disc surgury     [name]     OB History   No obstetric history on file.      Home Medications    Prior to Admission medications   Medication Sig Start Date End Date Taking? Authorizing Provider  acetaminophen (TYLENOL) 500 MG tablet Take 1,000 mg by mouth every 6 (six) hours as needed for moderate pain or headache.     [provider]  Cholecalciferol (VITAMIN D) 1000 UNITS capsule Take  1,000 Units by mouth every evening.     [provider]  clopidogrel (PLAVIX) 75 MG tablet TAKE 1 TABLET BY MOUTH EVERY DAY 06/06/18   Burnell Blanks, MD  diclofenac sodium (VOLTAREN) 1 % GEL APPLY 4 GRAMS TOPICALLY 4 TIMES DAILY Patient taking differently: Apply 4 g topically 4 (four) times daily as needed (pain).  04/04/18   Biagio Borg, MD  docusate sodium (COLACE) 100 MG capsule Take 100 mg by mouth every evening.     [provider]  furosemide (LASIX) 20 MG tablet Take 1 tablet (20 mg total) by mouth 2 (two) times daily as needed. 03/11/17   Biagio Borg, MD  hydrOXYzine (ATARAX/VISTARIL) 25 MG tablet TAKE 1 OR 2 TABLETS BY MOUTH EVERY 8 HOURS AS NEEDED FOR ANXIETY Patient taking differently: Take 25 mg by mouth 2 (two) times daily.  02/28/18   Biagio Borg, MD  isosorbide mononitrate (IMDUR) 30 MG 24 hr tablet TAKE 0.5 TABLETS (15 MG TOTAL) BY  MOUTH DAILY. Patient taking differently: Take 15 mg by mouth daily. TAKE 0.5 TABLETS (15 MG TOTAL) BY MOUTH DAILY. 04/11/18   Burnell Blanks, MD  lidocaine (LIDODERM) 5 % Place 1 patch onto the skin daily. Remove & Discard patch within 12 hours or as directed by MD 06/13/18   Elodia Florence., MD  lovastatin (MEVACOR) 20 MG tablet TAKE 20 MG BY MOUTH AT BEDTIME Patient taking differently: Take 20 mg by mouth at bedtime.  02/15/18   Biagio Borg, MD  metoprolol tartrate (LOPRESSOR) 25 MG tablet TAKE 1 TABLET BY MOUTH TWICE A DAY NEEDS APPT Patient taking differently: Take 25 mg by mouth 2 (two) times daily.  03/16/18   Burnell Blanks, MD  nitroGLYCERIN (NITROSTAT) 0.4 MG SL tablet Place 1 tablet (0.4 mg total) under the tongue every 5 (five) minutes as needed for chest pain (up to 3 doses). 04/13/12   Dunn, Dayna N, PA-C  ondansetron (ZOFRAN) 4 MG tablet Take 1 tablet (4 mg total) by mouth every 8 (eight) hours as needed for nausea or vomiting. Patient taking differently: Take 4 mg by mouth 2 (two) times daily.  01/06/16   Mackuen, Courteney Lyn, MD  OXYGEN Inhale 2 L into the lungs continuous.     [provider]  polyethylene glycol (MIRALAX / GLYCOLAX) packet Take 17 g by mouth daily as needed for mild constipation.    [provider]  polyvinyl alcohol (LIQUIFILM TEARS) 1.4 % ophthalmic solution Place 1 drop into both eyes daily as needed for dry eyes.     [provider]  potassium chloride (K-DUR) 10 MEQ tablet Take 1 tablet (10 mEq total) by mouth daily. 08/15/17   Biagio Borg, MD  predniSONE (DELTASONE) 10 MG tablet Take 2 tablets (20 mg total) by mouth daily with breakfast for 30 days. (resume after taper complete) 06/13/18 07/13/18  Elodia Florence., MD  PROAIR HFA 108 [phone] Base) MCG/ACT inhaler INHALE 2 PUFFS INTO THE LUNGS EVERY 6 (SIX) HOURS AS NEEDED FOR WHEEZING OR SHORTNESS OF BREATH. 05/09/18   Biagio Borg, MD  tiotropium (SPIRIVA  HANDIHALER) 18 MCG inhalation capsule Place 1 capsule (18 mcg total) into inhaler and inhale daily at 6 (six) AM. 04/11/18   Biagio Borg, MD  traMADol (ULTRAM) 50 MG tablet Take 2 tablets (100 mg total) by mouth 2 (two) times daily. 06/13/18   Elodia Florence., MD  traZODone (DESYREL) 50 MG tablet  Take 50 mg by mouth at bedtime.    [provider]  vitamin B-12 (CYANOCOBALAMIN) 1000 MCG tablet Take 1,000 mcg by mouth every evening.     [provider]  vitamin C (ASCORBIC ACID) 500 MG tablet Take 500 mg by mouth every evening.     [provider]    Family History Family History  Problem Relation Age of Onset  . Osteoporosis Mother   . Heart disease Sister   . Emphysema Sister   . Seizures Sister        epilepsy  . Cardiomyopathy Sister   . Heart attack Sister   . Colon cancer Neg Hx   . Colon polyps Neg Hx   . Esophageal cancer Neg Hx   . Rectal cancer Neg Hx   . Stomach cancer Neg Hx     Social History Social History   Tobacco Use  . Smoking status: Former Smoker    Packs/day: 1.50    Years: 51.00    Pack years: 76.50    Types: Cigarettes    Last attempt to quit: 07/24/2005    Years since quitting: 12.9  . Smokeless tobacco: Never Used  Substance Use Topics  . Alcohol use: No    Alcohol/week: 0.0 standard drinks  . Drug use: No     Allergies   Aspirin; Codeine; and Other   Review of Systems Review of Systems  Constitutional: Negative for fever.  HENT: Negative for rhinorrhea and sore throat.   Eyes: Negative for redness.  Respiratory: Negative for cough and shortness of breath.   Cardiovascular: Negative for chest pain.  Gastrointestinal: Negative for abdominal pain, diarrhea, nausea and vomiting.  Genitourinary: Negative for dysuria.  Musculoskeletal: Positive for back pain. Negative for myalgias.  Skin: Negative for rash.  Neurological: Negative for headaches.     Physical Exam Updated Vital Signs BP 126/75   Pulse  78   Temp 98.8 F (37.1 C) (Oral)   Resp 20   Ht 5\' 7"  (1.702 m)   Wt 51.7 kg   SpO2 97%   BMI 17.85 kg/m   Physical Exam Vitals signs and nursing note reviewed.  Constitutional:      Appearance: She is well-developed.     Comments: Patient appears comfortable while lying flat.  HENT:     Head: Normocephalic and atraumatic.  Eyes:     General:        Right eye: No discharge.        Left eye: No discharge.     Conjunctiva/sclera: Conjunctivae normal.  Neck:     Musculoskeletal: Normal range of motion and neck supple.  Cardiovascular:     Rate and Rhythm: Normal rate and regular rhythm.     Heart sounds: Normal heart sounds.  Pulmonary:     Effort: Pulmonary effort is normal.     Breath sounds: Normal breath sounds. No stridor. No wheezing or rhonchi.  Abdominal:     Palpations: Abdomen is soft.     Tenderness: There is no abdominal tenderness. There is no guarding or rebound.  Musculoskeletal:        General: Tenderness present.     Comments: Tenderness R paraspinous and sacral area.   Skin:    General: Skin is warm and dry.  Neurological:     Mental Status: She is alert.      ED Treatments / Results  Labs (all labs ordered are listed, but only abnormal results are displayed) Labs Reviewed  CBC WITH  DIFFERENTIAL/PLATELET - Abnormal; Notable for the following components:      Result Value   WBC 10.9 (*)    Hemoglobin 11.6 (*)    Neutro Abs 9.4 (*)    Abs Immature Granulocytes 0.12 (*)    All other components within normal limits  BASIC METABOLIC PANEL - Abnormal; Notable for the following components:   CO2 35 (*)    Glucose, Bld 119 (*)    Calcium 8.6 (*)    GFR calc non Af Amer 57 (*)    All other components within normal limits    EKG None  Radiology Dg Chest Portable 1 View  Result Date: 06/24/2018 CLINICAL DATA:  Chest pain and shortness of breath. EXAM: PORTABLE CHEST 1 VIEW COMPARISON:  06/07/2018; 05/28/2018; chest CT-05/29/2018 FINDINGS:  Grossly unchanged cardiac silhouette and mediastinal contours. The lungs remain hyperexpanded with mild diffuse slightly nodular thickening of the pulmonary interstitium. No discrete focal airspace opacities. No pleural effusion or pneumothorax. Old left-sided posterolateral rib fractures. Sequela of prior thoracic vertebral body cement augmentation. Peripherally calcified bilateral breast prostheses. IMPRESSION: Similar findings of lung hyperexpansion and chronic bronchitic change without superimposed acute cardiopulmonary disease. Electronically Signed   By: Sandi Mariscal M.D.   On: 06/24/2018 14:09    Procedures Procedures (including critical care time)  Medications Ordered in ED Medications  oxyCODONE (Oxy IR/ROXICODONE) immediate release tablet 5 mg (has no administration in time range)  lidocaine (LIDODERM) 5 % 1 patch (has no administration in time range)     Initial Impression / Assessment and Plan / ED Course  I have reviewed the triage vital signs and the nursing notes.  Pertinent labs & imaging results that were available during my care of the patient were reviewed by me and considered in my medical decision making (see chart for details).     Patient seen and examined. Work-up initiated. Discussed patient with Dr. Billy Fischer who will see.  Vital signs reviewed and are as follows: BP 126/75   Pulse 78   Temp 98.8 F (37.1 C) (Oral)   Resp 20   Ht 5\' 7"  (1.702 m)   Wt 51.7 kg   SpO2 97%   BMI 17.85 kg/m   3:43 PM Pt seen by Dr. Billy Fischer. Will give 5mg  oxycodone and monitor. Doubt COPD exacerbation, PE.    4:12 PM Patient cleared for d/c after discussion with Dr. Billy Fischer. She has discussed pain medication regimen with patient and family. She will need close monitoring.   Final Clinical Impressions(s) / ED Diagnoses   Final diagnoses:  Acute right-sided low back pain with right-sided sciatica   Patient with ongoing back pain and sacral pain, worsening over the  past week.  Patient has sciatica type symptoms.  No signs of infection.  Difficulty with mobility.  Unfortunately she had a low oxygen sat today in the setting of pain and narcotics use.  She is at a rehab facility currently.  She is using several modalities for pain.  Discussion with attending visit patient to continue pain medications under close management and follow-up with her pain management team.   ED Discharge Orders    None       Carlisle Cater, PA-C 06/24/18 1615    Gareth Morgan, MD 06/26/18 [phone]

## 2018-06-25 DIAGNOSIS — M719 Bursopathy, unspecified: Secondary | ICD-10-CM | POA: Diagnosis not present

## 2018-06-25 DIAGNOSIS — S3210XA Unspecified fracture of sacrum, initial encounter for closed fracture: Secondary | ICD-10-CM | POA: Diagnosis not present

## 2018-06-27 ENCOUNTER — Encounter: Payer: Self-pay | Admitting: Vascular Surgery

## 2018-06-28 ENCOUNTER — Inpatient Hospital Stay: Payer: Medicare Other | Admitting: Emergency Medicine

## 2018-06-28 DIAGNOSIS — M719 Bursopathy, unspecified: Secondary | ICD-10-CM | POA: Diagnosis not present

## 2018-06-29 ENCOUNTER — Encounter: Payer: Medicare Other | Admitting: Vascular Surgery

## 2018-06-29 ENCOUNTER — Encounter (HOSPITAL_COMMUNITY): Payer: Medicare Other

## 2018-07-02 ENCOUNTER — Other Ambulatory Visit: Payer: Self-pay | Admitting: Emergency Medicine

## 2018-07-04 ENCOUNTER — Telehealth: Payer: Self-pay | Admitting: *Deleted

## 2018-07-04 DIAGNOSIS — J439 Emphysema, unspecified: Secondary | ICD-10-CM | POA: Diagnosis not present

## 2018-07-04 DIAGNOSIS — M8008XD Age-related osteoporosis with current pathological fracture, vertebra(e), subsequent encounter for fracture with routine healing: Secondary | ICD-10-CM | POA: Diagnosis not present

## 2018-07-04 DIAGNOSIS — J9621 Acute and chronic respiratory failure with hypoxia: Secondary | ICD-10-CM | POA: Diagnosis not present

## 2018-07-04 DIAGNOSIS — M80051D Age-related osteoporosis with current pathological fracture, right femur, subsequent encounter for fracture with routine healing: Secondary | ICD-10-CM | POA: Diagnosis not present

## 2018-07-04 DIAGNOSIS — I110 Hypertensive heart disease with heart failure: Secondary | ICD-10-CM | POA: Diagnosis not present

## 2018-07-04 NOTE — Telephone Encounter (Signed)
Rec'd msg on Patient Ping.. Pt was admitted to [facility] from [facility] on 06/13/18. Pt was D/C to home on 07/01/18 with [place]: [facility]. Will call pt to follow-up and make f/u appt w/PCP.Marland KitchenJohny Chess

## 2018-07-04 NOTE — Telephone Encounter (Signed)
Called pt spoke w/husband verified d/c from [facility]. Husband states wife came home on Sat, and supposed to see Dr. Jenny Reichmann on 07/07/18. He states he is having problems with transferring her due to pt being so weak in the needs. She can not stand. Completed TCM call below.[name]  Transition Care Management Follow-up Telephone Call   Date discharged? 07/01/18   How have you been since you were released from the hospital? Spoke w/husband he states she is doing ok   Do you understand why you were in the hospital? YES   Do you understand the discharge instructions? YES   Where were you discharged to? Home   Items Reviewed:  Medications reviewed: YES, he states Dr. Jenny Reichmann may have to re-write some of her prescriptions   Allergies reviewed: YES  Dietary changes reviewed: YES, he states she's not eating a lot   Referrals reviewed: YES, he states [facility] home [facility] has contacted them to get PT & OT services for later this week   Functional Questionnaire:   Activities of Daily Living (ADLs):   She states he are independent in the following: feeding, grooming and toileting States she require assistance with the following: ambulation, bathing and hygiene, continence and dressing   Any transportation issues/concerns?: YES, due to pt not able to stand husband states he can not transport her in the car without help   Any patient concerns? NO   Confirmed importance and date/time of follow-up visits scheduled YES, 07/07/18  Provider Appointment booked with Dr. Jenny Reichmann   Confirmed with patient if condition begins to worsen call PCP or go to the ER.  Patient was given the office number and encouraged to call back with question or concerns.  : YES

## 2018-07-06 ENCOUNTER — Telehealth: Payer: Self-pay | Admitting: Internal Medicine

## 2018-07-06 DIAGNOSIS — M80051D Age-related osteoporosis with current pathological fracture, right femur, subsequent encounter for fracture with routine healing: Secondary | ICD-10-CM | POA: Diagnosis not present

## 2018-07-06 DIAGNOSIS — M8008XD Age-related osteoporosis with current pathological fracture, vertebra(e), subsequent encounter for fracture with routine healing: Secondary | ICD-10-CM | POA: Diagnosis not present

## 2018-07-06 DIAGNOSIS — J9621 Acute and chronic respiratory failure with hypoxia: Secondary | ICD-10-CM | POA: Diagnosis not present

## 2018-07-06 DIAGNOSIS — I110 Hypertensive heart disease with heart failure: Secondary | ICD-10-CM | POA: Diagnosis not present

## 2018-07-06 DIAGNOSIS — J439 Emphysema, unspecified: Secondary | ICD-10-CM | POA: Diagnosis not present

## 2018-07-06 NOTE — Telephone Encounter (Signed)
Ok for PT/OT verbals  OK to continue all medications as prescribed

## 2018-07-06 NOTE — Telephone Encounter (Signed)
Copied from [place] [id number]. Topic: Quick Communication - Home Health Verbal Orders >> Jul 06, 2018 12:49 PM Conception Chancy, NT wrote: Caller/Agency: [facility] Number: [phone] Requesting OT/PT/Skilled Nursing/Social Work: medical social worker Frequency: evaluation.  escitalopram (LEXAPRO) 5 MG tablet has drug interactions with the following  hydrOXYzine (ATARAX/VISTARIL) 25 MG tablet metoprolol tartrate (LOPRESSOR) 25 MG tablet escitalopram (LEXAPRO) 5 MG tablet traMADol (ULTRAM) 50 MG tablet ondansetron (ZOFRAN) 4 MG tablet Plavix 75 MG Trazodone 50MG   Tramadol  50 MG Trazodone 50 MG  traMADol (ULTRAM) 50 MG tablet ondansetron (ZOFRAN) 4 MG tablet  Trazodone 50MG  tiotropium (SPIRIVA HANDIHALER) 18 MCG inhalation capsule potassium chloride (K-DUR) 10 MEQ tablet

## 2018-07-07 ENCOUNTER — Inpatient Hospital Stay: Payer: Medicare Other | Admitting: Internal Medicine

## 2018-07-07 ENCOUNTER — Emergency Department (HOSPITAL_COMMUNITY): Payer: Medicare Other

## 2018-07-07 ENCOUNTER — Inpatient Hospital Stay (HOSPITAL_COMMUNITY)
Admission: EM | Admit: 2018-07-07 | Discharge: 2018-07-12 | DRG: 556 | Disposition: A | Discharge status: Home Health | Payer: Medicare Other | Attending: Family Medicine | Admitting: Family Medicine

## 2018-07-07 DIAGNOSIS — Z515 Encounter for palliative care: Secondary | ICD-10-CM | POA: Diagnosis present

## 2018-07-07 DIAGNOSIS — I5032 Chronic diastolic (congestive) heart failure: Secondary | ICD-10-CM | POA: Diagnosis present

## 2018-07-07 DIAGNOSIS — Z8262 Family history of osteoporosis: Secondary | ICD-10-CM

## 2018-07-07 DIAGNOSIS — H269 Unspecified cataract: Secondary | ICD-10-CM | POA: Diagnosis present

## 2018-07-07 DIAGNOSIS — R1084 Generalized abdominal pain: Secondary | ICD-10-CM | POA: Diagnosis not present

## 2018-07-07 DIAGNOSIS — I110 Hypertensive heart disease with heart failure: Secondary | ICD-10-CM | POA: Diagnosis present

## 2018-07-07 DIAGNOSIS — Z955 Presence of coronary angioplasty implant and graft: Secondary | ICD-10-CM

## 2018-07-07 DIAGNOSIS — M810 Age-related osteoporosis without current pathological fracture: Secondary | ICD-10-CM | POA: Diagnosis present

## 2018-07-07 DIAGNOSIS — E785 Hyperlipidemia, unspecified: Secondary | ICD-10-CM | POA: Diagnosis present

## 2018-07-07 DIAGNOSIS — Z820 Family history of epilepsy and other diseases of the nervous system: Secondary | ICD-10-CM

## 2018-07-07 DIAGNOSIS — Z79891 Long term (current) use of opiate analgesic: Secondary | ICD-10-CM

## 2018-07-07 DIAGNOSIS — Z7401 Bed confinement status: Secondary | ICD-10-CM

## 2018-07-07 DIAGNOSIS — F419 Anxiety disorder, unspecified: Secondary | ICD-10-CM | POA: Diagnosis present

## 2018-07-07 DIAGNOSIS — Z87891 Personal history of nicotine dependence: Secondary | ICD-10-CM

## 2018-07-07 DIAGNOSIS — Z885 Allergy status to narcotic agent status: Secondary | ICD-10-CM

## 2018-07-07 DIAGNOSIS — Z7952 Long term (current) use of systemic steroids: Secondary | ICD-10-CM

## 2018-07-07 DIAGNOSIS — Z8673 Personal history of transient ischemic attack (TIA), and cerebral infarction without residual deficits: Secondary | ICD-10-CM

## 2018-07-07 DIAGNOSIS — F329 Major depressive disorder, single episode, unspecified: Secondary | ICD-10-CM | POA: Diagnosis present

## 2018-07-07 DIAGNOSIS — M7918 Myalgia, other site: Principal | ICD-10-CM | POA: Diagnosis present

## 2018-07-07 DIAGNOSIS — Z7902 Long term (current) use of antithrombotics/antiplatelets: Secondary | ICD-10-CM

## 2018-07-07 DIAGNOSIS — K5900 Constipation, unspecified: Secondary | ICD-10-CM | POA: Diagnosis present

## 2018-07-07 DIAGNOSIS — R109 Unspecified abdominal pain: Secondary | ICD-10-CM | POA: Diagnosis present

## 2018-07-07 DIAGNOSIS — I2510 Atherosclerotic heart disease of native coronary artery without angina pectoris: Secondary | ICD-10-CM | POA: Diagnosis present

## 2018-07-07 DIAGNOSIS — R52 Pain, unspecified: Secondary | ICD-10-CM | POA: Diagnosis present

## 2018-07-07 DIAGNOSIS — Z79899 Other long term (current) drug therapy: Secondary | ICD-10-CM

## 2018-07-07 DIAGNOSIS — F40240 Claustrophobia: Secondary | ICD-10-CM | POA: Diagnosis present

## 2018-07-07 DIAGNOSIS — J9610 Chronic respiratory failure, unspecified whether with hypoxia or hypercapnia: Secondary | ICD-10-CM | POA: Diagnosis present

## 2018-07-07 DIAGNOSIS — Z825 Family history of asthma and other chronic lower respiratory diseases: Secondary | ICD-10-CM

## 2018-07-07 DIAGNOSIS — Z8719 Personal history of other diseases of the digestive system: Secondary | ICD-10-CM

## 2018-07-07 DIAGNOSIS — R1031 Right lower quadrant pain: Secondary | ICD-10-CM | POA: Diagnosis present

## 2018-07-07 DIAGNOSIS — Z9981 Dependence on supplemental oxygen: Secondary | ICD-10-CM

## 2018-07-07 DIAGNOSIS — Z90710 Acquired absence of both cervix and uterus: Secondary | ICD-10-CM

## 2018-07-07 DIAGNOSIS — M545 Low back pain: Secondary | ICD-10-CM | POA: Diagnosis not present

## 2018-07-07 DIAGNOSIS — I10 Essential (primary) hypertension: Secondary | ICD-10-CM | POA: Diagnosis present

## 2018-07-07 DIAGNOSIS — D649 Anemia, unspecified: Secondary | ICD-10-CM | POA: Diagnosis present

## 2018-07-07 DIAGNOSIS — S299XXA Unspecified injury of thorax, initial encounter: Secondary | ICD-10-CM | POA: Diagnosis not present

## 2018-07-07 DIAGNOSIS — Z888 Allergy status to other drugs, medicaments and biological substances status: Secondary | ICD-10-CM

## 2018-07-07 DIAGNOSIS — Z7951 Long term (current) use of inhaled steroids: Secondary | ICD-10-CM

## 2018-07-07 DIAGNOSIS — I252 Old myocardial infarction: Secondary | ICD-10-CM

## 2018-07-07 DIAGNOSIS — S2232XA Fracture of one rib, left side, initial encounter for closed fracture: Secondary | ICD-10-CM

## 2018-07-07 DIAGNOSIS — Z8249 Family history of ischemic heart disease and other diseases of the circulatory system: Secondary | ICD-10-CM

## 2018-07-07 DIAGNOSIS — J439 Emphysema, unspecified: Secondary | ICD-10-CM | POA: Diagnosis present

## 2018-07-07 DIAGNOSIS — M1610 Unilateral primary osteoarthritis, unspecified hip: Secondary | ICD-10-CM | POA: Diagnosis present

## 2018-07-07 LAB — CBC
HCT: 37.4 % (ref 36.0–46.0)
Hemoglobin: 11.9 g/dL — ABNORMAL LOW (ref 12.0–15.0)
MCH: 28.5 pg (ref 26.0–34.0)
MCHC: 31.8 g/dL (ref 30.0–36.0)
MCV: 89.7 fL (ref 80.0–100.0)
Platelets: 290 10*3/uL (ref 150–400)
RBC: 4.17 MIL/uL (ref 3.87–5.11)
RDW: 15.1 % (ref 11.5–15.5)
WBC: 17.2 10*3/uL — AB (ref 4.0–10.5)
nRBC: 0 % (ref 0.0–0.2)

## 2018-07-07 LAB — COMPREHENSIVE METABOLIC PANEL
ALT: 15 U/L (ref 0–44)
AST: 18 U/L (ref 15–41)
Albumin: 3 g/dL — ABNORMAL LOW (ref 3.5–5.0)
Alkaline Phosphatase: 171 U/L — ABNORMAL HIGH (ref 38–126)
Anion gap: 12 (ref 5–15)
BUN: 13 mg/dL (ref 8–23)
CO2: 27 mmol/L (ref 22–32)
Calcium: 8.7 mg/dL — ABNORMAL LOW (ref 8.9–10.3)
Chloride: 95 mmol/L — ABNORMAL LOW (ref 98–111)
Creatinine, Ser: 0.85 mg/dL (ref 0.44–1.00)
GFR calc Af Amer: >60 mL/min (ref >60)
Glucose, Bld: 130 mg/dL — ABNORMAL HIGH (ref 70–99)
Potassium: 5 mmol/L (ref 3.5–5.1)
Sodium: 134 mmol/L — ABNORMAL LOW (ref 135–145)
Total Bilirubin: 0.9 mg/dL (ref 0.3–1.2)
Total Protein: 5.3 g/dL — ABNORMAL LOW (ref 6.5–8.1)

## 2018-07-07 LAB — URINALYSIS, ROUTINE W REFLEX MICROSCOPIC
Bilirubin Urine: NEGATIVE
Glucose, UA: NEGATIVE mg/dL
Hgb urine dipstick: NEGATIVE
KETONES UR: NEGATIVE mg/dL
Leukocytes,Ua: NEGATIVE
Nitrite: NEGATIVE
PROTEIN: NEGATIVE mg/dL
Specific Gravity, Urine: 1.012 (ref 1.005–1.030)
pH: 6 (ref 5.0–8.0)

## 2018-07-07 LAB — LIPASE, BLOOD: Lipase: 19 U/L (ref 11–51)

## 2018-07-07 MED ORDER — SODIUM CHLORIDE 0.9% FLUSH: 3.0000 mL | Freq: Once | INTRAVENOUS | Status: DC

## 2018-07-07 MED ORDER — FENTANYL CITRATE (PF) 100 MCG/2ML IJ SOLN
25.0000 ug | Freq: Once | INTRAMUSCULAR | Status: AC
  Administered 2018-07-07: 25 ug via INTRAVENOUS
  Filled 2018-07-07: qty 2

## 2018-07-07 NOTE — ED Notes (Signed)
IV team at bedside 

## 2018-07-07 NOTE — ED Triage Notes (Signed)
Per EMS pt from home. Bed bound. Multiple compression fx's in lumbar and sacral area.  Abdominal pain 3-5 days. No n/v/d but some constipation. Lives with husband.  Complaints today are focused on the abdominal pain.

## 2018-07-07 NOTE — Care Management (Signed)
ED CM consulted concerning transitional care needs. ED CM met with patient and spouse at bedside.  Husband reports that patient has recently discharged from [facility], with [facility] services, RN,PT, OT and SW.  Husband share concerns regarding patient's ongoing pain, and also share that patient is active with [facility]. Patient has chronic pain,inquired  if husband has  contacted the pain management clinic he states he had not because she was in the hospital and SNF.  CM explained that patient does not meet criteria for inpatient admission at this time. CM encouraged him  to do so to assist with managing her pain. Informed the patient and husband that the ED  CM will contact [facility] to follow up on ED discharge.  Updated Dr. Jeanell Sparrow, on conversation with Husband.

## 2018-07-07 NOTE — ED Provider Notes (Signed)
[facility]ERG[facility] DEPARTMENT Provider Note   CSN: [id number] Arrival date & time: 07/07/18  1953     History   Chief Complaint Chief Complaint  Patient presents with  . Abdominal Pain    HPI Alexis Higgins is a 78 y.o. female.  HPI  78 year old female with history of COPD, CAD, compression fractures, hypertension, oxygen dependent, former smoker presents today complaining of left-sided abdominal pain.  Patient is present here with her husband who gives much of the history.  He reports that [facility] was [facility] after [facility].  He reports that she has been getting gradually weaker over the past several months.  He is having difficulty transferring her.  He states he has had to use a strap most recently otherwise she is unable to get up at all.  He states that she is having severe pain in the left side of her abdomen.  He states that this has been present for  Past Medical History:  Diagnosis Date  . ANXIETY 01/01/2007  . Blood transfusion without reported diagnosis 2015  . BURSITIS, RIGHT HIP 06/04/2009  . CAD (coronary artery disease)    a. BMS to LAD 2010. b. NSTEMI with DES to LAD for ISR 2011. c. Patent stent 03/2012/Imdur added.  . Cataract   . Colon polyps    H/o tubular adenoma of colon  . COPD 01/01/2007   a. Chronic resp failure on home O2.  Marland Kitchen DEPRESSION 01/01/2007  . Eczema 01/08/2011  . GROIN PAIN 06/20/2008  . Headache(784.0) 01/01/2007  . Hemorrhoid   . HYPERTENSION 01/01/2007  . Impaired glucose tolerance 01/07/2011  . LOW BACK PAIN 01/01/2007  . Muscle weakness (generalized) 06/04/2009  . On home oxygen therapy    "2L; 24/7" (03/21/2018)  . OSTEOARTHRITIS, HIP 07/01/2008  . OSTEOPOROSIS 01/01/2007  . Pneumonia 1998  . Rosacea 01/08/2011  . SYNCOPE 01/01/2007  . Thoracic compression fracture ([place])   . TRANSIENT ISCHEMIC ATTACK, HX OF 01/01/2007    Patient Active Problem List   Diagnosis Date Noted  .  Acute respiratory failure with hypoxia ([place]) 06/07/2018  . Right hip pain 06/07/2018  . Elevated troponin   . COPD exacerbation ([place]) 05/28/2018  . Lymphedema 03/21/2018  . Left upper quadrant pain 12/21/2017  . 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 Insect bite, sequela 09/21/2017  . Hypokalemia 08/15/2017  . On home O2 07/27/2017  . Itching 07/08/2017  . Skin lesion 07/08/2017  . Ingrown nail 07/08/2017  . Back pain 10/06/2016  . Fracture of seventh thoracic vertebra ([place]) 10/06/2016  . Non-traumatic compression fracture of T7 thoracic vertebra   . Thoracic compression fracture ([place])   . Intractable back pain 09/10/2016  . Closed compression fracture of thoracic vertebra ([place])   . 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 Peripheral edema 06/15/2016  . Smoker 08/02/2014  . Peripheral vascular disease ([place]) 08/02/2014  . Abdominal pain, epigastric 11/22/2013  . Black stools 11/22/2013  . Displaced fracture of left femoral neck ([place]) 09/30/2013  . Fracture of femoral neck, left, closed ([place]) 09/30/2013  . Fall at home 09/30/2013  . Head contusion 09/30/2013  . Leg weakness, bilateral 07/21/2012  . 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 Unstable angina pectoris ([place]) 04/12/2012  . Chronic respiratory failure ([place]) 02/13/2012  . Localized swelling, mass and lump, neck 01/06/2012  . CAD (coronary artery disease) 12/14/2011  . Dyspnea 11/22/2011  . Personal history of colonic polyps 09/06/2011  . Eczema  01/08/2011  . Rosacea 01/08/2011  . URI (upper respiratory infection) 01/08/2011  . Dizziness - light-headed 01/08/2011  . Encounter for long-term (current) use of high-risk medication 01/08/2011  . Impaired glucose tolerance 01/07/2011  . Preventative health care 01/07/2011  . BURSITIS, RIGHT HIP 06/04/2009  . CAD, NATIVE VESSEL 01/15/2009  . Other symptoms involving cardiovascular system 01/15/2009  . CHEST PAIN-PRECORDIAL 01/15/2009  . OSTEOARTHRITIS, HIP  07/01/2008  . Hyperlipidemia 01/01/2007  . Anxiety state 01/01/2007  . 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 Essential hypertension 01/01/2007  . COPD (chronic obstructive pulmonary disease) with emphysema ([place]) 01/01/2007  . LOW BACK PAIN 01/01/2007  . Osteoporosis 01/01/2007  . SYNCOPE 01/01/2007  . Headache(784.0) 01/01/2007  . TRANSIENT ISCHEMIC ATTACK, HX OF 01/01/2007    Past Surgical History:  Procedure Laterality Date  . ABDOMINAL HYSTERECTOMY    . APPENDECTOMY    . BACK SURGERY    . BREAST ENHANCEMENT SURGERY    . COLONOSCOPY  01/25/2002   tubular adenoma,hemorrhoids, hyperplastic  colon polyps  . COLONOSCOPY  02/17/2005   hemorrhoids  . CORONARY ANGIOPLASTY    . CORONARY STENT PLACEMENT    . ESOPHAGOGASTRODUODENOSCOPY (EGD) WITH PROPOFOL N/A 08/10/2017   Procedure: ESOPHAGOGASTRODUODENOSCOPY (EGD) WITH PROPOFOL;  Surgeon: Gatha Mayer, MD;  Location: [facility];  Service: Endoscopy;  Laterality: N/A;  . HIP ARTHROPLASTY Left 10/01/2013   Procedure: ARTHROPLASTY UNIPOLAR   HIP;  Surgeon: Marianna Payment, MD;  Location: [facility];  Service: Orthopedics;  Laterality: Left;  . HIP SURGERY Left    DR XU     PROXIMAL NECK   . IR GENERIC HISTORICAL  02/19/2016   IR VERTEBROPLASTY CERV/THOR BX INC UNI/BIL INC/INJECT/IMAGING 02/19/2016 Luanne Bras, MD [facility]  . IR GENERIC HISTORICAL  07/15/2016   IR KYPHO THORACIC WITH BONE BIOPSY 07/15/2016 Luanne Bras, MD [facility]  . IR KYPHO EA ADDL LEVEL THORACIC OR LUMBAR  09/14/2016  . IR RADIOLOGIST EVAL & MGMT  09/22/2016  . IR VERTEBROPLASTY CERV/THOR BX INC UNI/BIL INC/INJECT/IMAGING  10/08/2016  . IR VERTEBROPLASTY CERV/THOR BX INC UNI/BIL INC/INJECT/IMAGING  11/16/2016  . KYPHOPLASTY  12/2015-10/2016 X 5   "?2lumbar & 3 thoracic"  . LEFT HEART CATHETERIZATION WITH CORONARY ANGIOGRAM N/A 04/13/2012   Procedure: LEFT HEART CATHETERIZATION WITH CORONARY ANGIOGRAM;  Surgeon: Burnell Blanks, MD;  Location: [facility] CATH  LAB;  Service: Cardiovascular;  Laterality: N/A;  . OOPHORECTOMY     one ovary  . s/p bilat cataract  2010  . sp lumbar disc surgury     [name]     OB History   No obstetric history on file.      Home Medications    Prior to Admission medications   Medication Sig Start Date End Date Taking? Authorizing Provider  acetaminophen (TYLENOL) 500 MG tablet Take 1,000 mg by mouth every 6 (six) hours as needed (pain).     [provider]  Cholecalciferol (VITAMIN D) 1000 UNITS capsule Take 1,000 Units by mouth every evening.     [provider]  clopidogrel (PLAVIX) 75 MG tablet TAKE 1 TABLET BY MOUTH EVERY DAY Patient taking differently: Take 75 mg by mouth daily.  06/06/18   Burnell Blanks, MD  diclofenac sodium (VOLTAREN) 1 % GEL APPLY 4 GRAMS TOPICALLY 4 TIMES DAILY Patient taking differently: Apply 1 g topically See admin instructions. For chronic back pain - apply with lidocaine patch to lower back twice daily 04/04/18   Biagio Borg, MD  docusate  sodium (COLACE) 100 MG capsule Take 100 mg by mouth every evening.     [provider]  escitalopram (LEXAPRO) 5 MG tablet Take 5 mg by mouth daily.    [provider]  furosemide (LASIX) 20 MG tablet Take 1 tablet (20 mg total) by mouth 2 (two) times daily as needed. Patient not taking: Reported on 06/24/2018 03/11/17   Biagio Borg, MD  HYDROcodone-acetaminophen (NORCO/VICODIN) 5-325 MG tablet Take 1-2 tablets by mouth every 6 (six) hours as needed (1 tablet - mild to moderate pain, 2 tablets moderate to severe pain).    [provider]  hydrocortisone (ANUSOL-HC) 2.5 % rectal cream Place 1 application rectally 2 (two) times daily.    [provider]  hydrOXYzine (ATARAX/VISTARIL) 25 MG tablet TAKE 1 OR 2 TABLETS BY MOUTH EVERY 8 HOURS AS NEEDED FOR ANXIETY Patient taking differently: Take 25 mg by mouth 2 (two) times daily. For itching 02/28/18   Biagio Borg, MD    isosorbide mononitrate (IMDUR) 30 MG 24 hr tablet TAKE 0.5 TABLETS (15 MG TOTAL) BY MOUTH DAILY. Patient taking differently: Take 15 mg by mouth daily.  04/11/18   Burnell Blanks, MD  lidocaine (LIDODERM) 5 % Place 1 patch onto the skin daily. Remove & Discard patch within 12 hours or as directed by MD 06/13/18   Elodia Florence., MD  lovastatin (MEVACOR) 20 MG tablet TAKE 20 MG BY MOUTH AT BEDTIME Patient taking differently: Take 20 mg by mouth at bedtime.  02/15/18   Biagio Borg, MD  methocarbamol (ROBAXIN) 500 MG tablet Take 500 mg by mouth every 8 (eight) hours as needed for muscle spasms.    [provider]  metoprolol tartrate (LOPRESSOR) 25 MG tablet TAKE 1 TABLET BY MOUTH TWICE A DAY NEEDS APPT Patient taking differently: Take 25 mg by mouth 2 (two) times daily.  03/16/18   Burnell Blanks, MD  nitroGLYCERIN (NITROSTAT) 0.4 MG SL tablet Place 1 tablet (0.4 mg total) under the tongue every 5 (five) minutes as needed for chest pain (up to 3 doses). 04/13/12   Dunn, Dayna N, PA-C  ondansetron (ZOFRAN) 4 MG tablet Take 1 tablet (4 mg total) by mouth every 8 (eight) hours as needed for nausea or vomiting. Patient taking differently: Take 4 mg by mouth 2 (two) times daily.  01/06/16   Mackuen, Courteney Lyn, MD  OXYGEN Inhale 2 L into the lungs continuous.     [provider]  polyethylene glycol (MIRALAX / GLYCOLAX) packet Take 17 g by mouth daily.     [provider]  polyvinyl alcohol (LIQUIFILM TEARS) 1.4 % ophthalmic solution Place 1 drop into both eyes daily as needed for dry eyes.     [provider]  potassium chloride (K-DUR) 10 MEQ tablet Take 1 tablet (10 mEq total) by mouth daily. 08/15/17   Biagio Borg, MD  predniSONE (DELTASONE) 10 MG tablet TAKE 1 & 1/2 TABLET(S) BY MOUTH DAILY WITH BREAKFAST 07/03/18   Collene Gobble, MD  predniSONE (DELTASONE) 20 MG tablet Take 20 mg by mouth daily.    [provider]  PROAIR  HFA 108 (90 Base) MCG/ACT inhaler INHALE 2 PUFFS INTO THE LUNGS EVERY 6 (SIX) HOURS AS NEEDED FOR WHEEZING OR SHORTNESS OF BREATH. 05/09/18   Biagio Borg, MD  tiotropium (SPIRIVA HANDIHALER) 18 MCG inhalation capsule Place 1 capsule (18 mcg total) into inhaler and inhale daily at 6 (six) AM. Patient taking differently: Place  18 mcg into inhaler and inhale daily.  04/11/18   Biagio Borg, MD  traMADol (ULTRAM) 50 MG tablet Take 2 tablets (100 mg total) by mouth 2 (two) times daily. 06/13/18   Elodia Florence., MD  traZODone (DESYREL) 50 MG tablet Take 50 mg by mouth at bedtime.    [provider]  vitamin B-12 (CYANOCOBALAMIN) 1000 MCG tablet Take 1,000 mcg by mouth every evening.     [provider]  vitamin C (ASCORBIC ACID) 500 MG tablet Take 500 mg by mouth every evening.     [provider]    Family History Family History  Problem Relation Age of Onset  . Osteoporosis Mother   . Heart disease Sister   . Emphysema Sister   . Seizures Sister        epilepsy  . Cardiomyopathy Sister   . Heart attack Sister   . Colon cancer Neg Hx   . Colon polyps Neg Hx   . Esophageal cancer Neg Hx   . Rectal cancer Neg Hx   . Stomach cancer Neg Hx     Social History Social History   Tobacco Use  . Smoking status: Former Smoker    Packs/day: 1.50    Years: 51.00    Pack years: 76.50    Types: Cigarettes    Last attempt to quit: 07/24/2005    Years since quitting: 12.9  . Smokeless tobacco: Never Used  Substance Use Topics  . Alcohol use: No    Alcohol/week: 0.0 standard drinks  . Drug use: No     Allergies   Aspirin; Codeine; and Stiolto respimat [tiotropium bromide-olodaterol]   Review of Systems Review of Systems   Physical Exam Updated Vital Signs BP (!) 176/90 (BP Location: Right Leg)   Pulse 86   Temp 99 F (37.2 C) (Rectal)   Resp 16   SpO2 95%   Physical Exam Vitals signs and nursing note reviewed.  Constitutional:       Appearance: She is well-developed. She is ill-appearing.  HENT:     Head: Normocephalic and atraumatic.     Mouth/Throat:     Mouth: Mucous membranes are moist.  Eyes:     Extraocular Movements: Extraocular movements intact.  Cardiovascular:     Rate and Rhythm: Normal rate and regular rhythm.  Pulmonary:     Effort: Pulmonary effort is normal.     Breath sounds: Normal breath sounds.     Comments: Patient on oxygen Abdominal:     General: Abdomen is flat. Bowel sounds are normal.     Tenderness: There is abdominal tenderness.     Comments: Some tenderness to left side of abdomen but does not really reproduce pain There is a mild contusion of the left side the abdomen  Skin:    General: Skin is warm.     Comments: Bilateral lower extremities are cool Skin is extremely frail and she has multiple contusions of her bilateral upper extremities with skin tears  Neurological:     Mental Status: She is alert.      ED Treatments / Results  Labs (all labs ordered are listed, but only abnormal results are displayed) Labs Reviewed  CBC - Abnormal; Notable for the following components:      Result Value   WBC 17.2 (*)    Hemoglobin 11.9 (*)    All other components within normal limits  URINALYSIS, ROUTINE W REFLEX MICROSCOPIC  LIPASE, BLOOD  COMPREHENSIVE METABOLIC PANEL  EKG None  Radiology Ct Renal Stone Study  Result Date: 07/07/2018 CLINICAL DATA:  Flank pain EXAM: CT ABDOMEN AND PELVIS WITHOUT CONTRAST TECHNIQUE: Multidetector CT imaging of the abdomen and pelvis was performed following the standard protocol without IV contrast. COMPARISON:  06/21/2017 FINDINGS: LOWER CHEST: Moderate emphysema. HEPATOBILIARY: The hepatic contours and density are normal. There is no intra- or extrahepatic biliary dilatation. The gallbladder is normal. PANCREAS: The pancreatic parenchymal contours are normal and there is no ductal dilatation. There is no peripancreatic fluid collection.  SPLEEN: Normal. ADRENALS/URINARY TRACT: --Adrenal glands: Normal. --Right kidney/ureter: No hydronephrosis, nephroureterolithiasis, perinephric stranding or solid renal mass. --Left kidney/ureter: No hydronephrosis, nephroureterolithiasis, perinephric stranding or solid renal mass. --Urinary bladder: Normal for degree of distention STOMACH/BOWEL: --Stomach/Duodenum: There is no hiatal hernia or other gastric abnormality. The duodenal course and caliber are normal. --Small bowel: No dilatation or inflammation. --Colon: No focal abnormality. --Appendix: Not visualized. No right lower quadrant inflammation or free fluid. VASCULAR/LYMPHATIC: There is aortic atherosclerosis without hemodynamically significant stenosis. No abdominal or pelvic lymphadenopathy. REPRODUCTIVE: Status post hysterectomy. No adnexal mass. MUSCULOSKELETAL. No bony spinal canal stenosis or focal osseous abnormality. Left hip arthroplasty. OTHER: None. IMPRESSION: No acute abdominal or pelvic abnormality Aortic Atherosclerosis (ICD10-I70.0) and Emphysema (ICD10-J43.9). Electronically Signed   By: Ulyses Jarred M.D.   On: 07/07/2018 22:07    Procedures Procedures (including critical care time)  Medications Ordered in ED Medications  sodium chloride flush (NS) 0.9 % injection 3 mL (has no administration in time range)     Initial Impression / Assessment and Plan / ED Course  I have reviewed the triage vital signs and the nursing notes.  Pertinent labs & imaging results that were available during my care of the patient were reviewed by me and considered in my medical decision making (see chart for details).   This is a frail 78 year old female who presents today with chief complaint, per her husband, that she is having left-sided abdominal pain.  He dates that she is not having any change in her respiratory status.  She endorses this.  However, she has had increased pain in the left side of her abdomen for some period of time up to  several weeks.  Husband does state that he is concerned that he may have injured her when he had to move her.  She has been coming increasingly weak and he is having to use this to help her move.  My initial evaluation here found no acute abnormalities.  Husband is very insistent that he needs her to have pain control.  She received fentanyl here and is on Percocet and tramadol at home.  We discussed pain control.  He states that she sees Dr. Jenny Reichmann but sees a pain specialist.  She has not been in to see her pain specialist since the middle of January.  Husband states that the tramadol works only for the pain in her right leg.  He states that the other medicine also only works for this pain.  Husband feels that she needs to be admitted for pain control. Case management was consulted please see full note  Discussed with radiologist and most rib fxs look old but reviewed and suggests one may be subacute at rib 8 in comparison to 05/29/2018  Discussed with Dr. Hal Hope and will admit for pain control  Final Clinical Impressions(s) / ED Diagnoses   Final diagnoses:  Left sided abdominal pain  Closed fracture of one rib of left side, initial encounter  ED Discharge Orders    None       Pattricia Boss, MD 07/07/18 [id number]

## 2018-07-07 NOTE — Telephone Encounter (Signed)
Notified Jeanna w/[facility] response.Marland KitchenJohny Chess

## 2018-07-08 ENCOUNTER — Observation Stay (HOSPITAL_COMMUNITY): Payer: Medicare Other

## 2018-07-08 ENCOUNTER — Encounter (HOSPITAL_COMMUNITY): Payer: Self-pay | Admitting: Internal Medicine

## 2018-07-08 DIAGNOSIS — R109 Unspecified abdominal pain: Secondary | ICD-10-CM | POA: Diagnosis not present

## 2018-07-08 DIAGNOSIS — Z515 Encounter for palliative care: Secondary | ICD-10-CM

## 2018-07-08 DIAGNOSIS — I10 Essential (primary) hypertension: Secondary | ICD-10-CM

## 2018-07-08 DIAGNOSIS — R0781 Pleurodynia: Secondary | ICD-10-CM | POA: Diagnosis not present

## 2018-07-08 DIAGNOSIS — R0602 Shortness of breath: Secondary | ICD-10-CM | POA: Diagnosis not present

## 2018-07-08 LAB — MAGNESIUM: Magnesium: 1.9 mg/dL (ref 1.7–2.4)

## 2018-07-08 LAB — CBC WITH DIFFERENTIAL/PLATELET
Abs Immature Granulocytes: 0.57 10*3/uL — ABNORMAL HIGH (ref 0.00–0.07)
Basophils Absolute: 0 10*3/uL (ref 0.0–0.1)
Basophils Relative: 0 %
Eosinophils Absolute: 0 10*3/uL (ref 0.0–0.5)
Eosinophils Relative: 0 %
HCT: 36.6 % (ref 36.0–46.0)
Hemoglobin: 11.3 g/dL — ABNORMAL LOW (ref 12.0–15.0)
Immature Granulocytes: 2 %
Lymphocytes Relative: 4 %
Lymphs Abs: 0.9 10*3/uL (ref 0.7–4.0)
MCH: 27.4 pg (ref 26.0–34.0)
MCHC: 30.9 g/dL (ref 30.0–36.0)
MCV: 88.6 fL (ref 80.0–100.0)
Monocytes Absolute: 2 10*3/uL — ABNORMAL HIGH (ref 0.1–1.0)
Monocytes Relative: 8 %
Neutro Abs: 21 10*3/uL — ABNORMAL HIGH (ref 1.7–7.7)
Neutrophils Relative %: 86 %
Platelets: 295 10*3/uL (ref 150–400)
RBC: 4.13 MIL/uL (ref 3.87–5.11)
RDW: 15.3 % (ref 11.5–15.5)
WBC: 24.5 10*3/uL — ABNORMAL HIGH (ref 4.0–10.5)
nRBC: 0 % (ref 0.0–0.2)

## 2018-07-08 LAB — RENAL FUNCTION PANEL
Albumin: 2.8 g/dL — ABNORMAL LOW (ref 3.5–5.0)
Anion gap: 6 (ref 5–15)
BUN: 12 mg/dL (ref 8–23)
CO2: 31 mmol/L (ref 22–32)
Calcium: 8.8 mg/dL — ABNORMAL LOW (ref 8.9–10.3)
Chloride: 99 mmol/L (ref 98–111)
Creatinine, Ser: 0.89 mg/dL (ref 0.44–1.00)
GFR calc Af Amer: >60 mL/min (ref >60)
GFR calc non Af Amer: >60 mL/min (ref >60)
Glucose, Bld: 92 mg/dL (ref 70–99)
Phosphorus: 3.2 mg/dL (ref 2.5–4.6)
Potassium: 4.5 mmol/L (ref 3.5–5.1)
Sodium: 136 mmol/L (ref 135–145)

## 2018-07-08 LAB — PROCALCITONIN: Procalcitonin: 3.53 ng/mL

## 2018-07-08 MED ORDER — OXYCODONE HCL ER 10 MG PO T12A
10.0000 mg | EXTENDED_RELEASE_TABLET | Freq: Two times a day (BID) | ORAL | Status: DC
  Administered 2018-07-08 – 2018-07-12 (×8): 10 mg via ORAL
  Filled 2018-07-08 (×8): qty 1

## 2018-07-08 MED ORDER — POLYVINYL ALCOHOL 1.4 % OP SOLN
1.0000 [drp] | Freq: Every day | OPHTHALMIC | Status: DC | PRN
  Filled 2018-07-08: qty 15

## 2018-07-08 MED ORDER — ONDANSETRON HCL 4 MG PO TABS
4.0000 mg | ORAL_TABLET | Freq: Two times a day (BID) | ORAL | Status: DC
  Administered 2018-07-08 – 2018-07-12 (×9): 4 mg via ORAL
  Filled 2018-07-08 (×9): qty 1

## 2018-07-08 MED ORDER — POLYETHYLENE GLYCOL 3350 17 G PO PACK: 17.0000 g | PACK | Freq: Every day | ORAL | Status: DC | PRN

## 2018-07-08 MED ORDER — PRAVASTATIN SODIUM 10 MG PO TABS
20.0000 mg | ORAL_TABLET | Freq: Every day | ORAL | Status: DC
  Administered 2018-07-08 – 2018-07-11 (×4): 20 mg via ORAL
  Filled 2018-07-08 (×4): qty 2

## 2018-07-08 MED ORDER — NITROGLYCERIN 0.4 MG SL SUBL: 0.4000 mg | SUBLINGUAL_TABLET | SUBLINGUAL | Status: DC | PRN

## 2018-07-08 MED ORDER — ALBUTEROL SULFATE (2.5 MG/3ML) 0.083% IN NEBU: 3.0000 mL | INHALATION_SOLUTION | Freq: Four times a day (QID) | RESPIRATORY_TRACT | Status: DC | PRN

## 2018-07-08 MED ORDER — ESCITALOPRAM OXALATE 10 MG PO TABS
5.0000 mg | ORAL_TABLET | Freq: Every day | ORAL | Status: DC
  Administered 2018-07-08 – 2018-07-12 (×5): 5 mg via ORAL
  Filled 2018-07-08 (×5): qty 1

## 2018-07-08 MED ORDER — ACETAMINOPHEN 325 MG PO TABS
650.0000 mg | ORAL_TABLET | Freq: Four times a day (QID) | ORAL | Status: DC | PRN
  Administered 2018-07-09: 650 mg via ORAL
  Filled 2018-07-08: qty 2

## 2018-07-08 MED ORDER — CLOPIDOGREL BISULFATE 75 MG PO TABS
75.0000 mg | ORAL_TABLET | Freq: Every day | ORAL | Status: DC
  Administered 2018-07-08 – 2018-07-12 (×5): 75 mg via ORAL
  Filled 2018-07-08 (×5): qty 1

## 2018-07-08 MED ORDER — VITAMIN C 500 MG PO TABS
500.0000 mg | ORAL_TABLET | Freq: Every evening | ORAL | Status: DC
  Administered 2018-07-08 – 2018-07-11 (×4): 500 mg via ORAL
  Filled 2018-07-08 (×4): qty 1

## 2018-07-08 MED ORDER — TRAZODONE HCL 50 MG PO TABS
50.0000 mg | ORAL_TABLET | Freq: Every day | ORAL | Status: DC
  Administered 2018-07-08 – 2018-07-11 (×4): 50 mg via ORAL
  Filled 2018-07-08 (×4): qty 1

## 2018-07-08 MED ORDER — VITAMIN B-12 1000 MCG PO TABS
1000.0000 ug | ORAL_TABLET | Freq: Every evening | ORAL | Status: DC
  Administered 2018-07-08 – 2018-07-11 (×4): 1000 ug via ORAL
  Filled 2018-07-08 (×4): qty 1

## 2018-07-08 MED ORDER — ACETAMINOPHEN 650 MG RE SUPP: 650.0000 mg | Freq: Four times a day (QID) | RECTAL | Status: DC | PRN

## 2018-07-08 MED ORDER — VITAMIN D 25 MCG (1000 UNIT) PO TABS
1000.0000 [IU] | ORAL_TABLET | Freq: Every evening | ORAL | Status: DC
  Administered 2018-07-08 – 2018-07-11 (×4): 1000 [IU] via ORAL
  Filled 2018-07-08 (×4): qty 1

## 2018-07-08 MED ORDER — ENOXAPARIN SODIUM 40 MG/0.4ML ~~LOC~~ SOLN
40.0000 mg | SUBCUTANEOUS | Status: DC
  Administered 2018-07-08: 40 mg via SUBCUTANEOUS
  Filled 2018-07-08: qty 0.4

## 2018-07-08 MED ORDER — LIDOCAINE 5 % EX PTCH
1.0000 | MEDICATED_PATCH | CUTANEOUS | Status: DC
  Administered 2018-07-08: 1 via TRANSDERMAL
  Filled 2018-07-08 (×2): qty 1

## 2018-07-08 MED ORDER — ISOSORBIDE MONONITRATE ER 30 MG PO TB24
15.0000 mg | ORAL_TABLET | Freq: Every day | ORAL | Status: DC
  Administered 2018-07-08 – 2018-07-12 (×5): 15 mg via ORAL
  Filled 2018-07-08 (×5): qty 1

## 2018-07-08 MED ORDER — ONDANSETRON HCL 4 MG PO TABS
4.0000 mg | ORAL_TABLET | Freq: Four times a day (QID) | ORAL | Status: DC | PRN
  Administered 2018-07-10: 4 mg via ORAL
  Filled 2018-07-08: qty 1

## 2018-07-08 MED ORDER — FENTANYL CITRATE (PF) 100 MCG/2ML IJ SOLN
25.0000 ug | INTRAMUSCULAR | Status: DC | PRN
  Administered 2018-07-08 – 2018-07-10 (×9): 25 ug via INTRAVENOUS
  Filled 2018-07-08 (×9): qty 2

## 2018-07-08 MED ORDER — ONDANSETRON HCL 4 MG/2ML IJ SOLN
4.0000 mg | Freq: Four times a day (QID) | INTRAMUSCULAR | Status: DC | PRN
  Administered 2018-07-08: 4 mg via INTRAVENOUS
  Filled 2018-07-08: qty 2

## 2018-07-08 MED ORDER — UMECLIDINIUM BROMIDE 62.5 MCG/INH IN AEPB
1.0000 | INHALATION_SPRAY | Freq: Every day | RESPIRATORY_TRACT | Status: DC
  Filled 2018-07-08: qty 7

## 2018-07-08 MED ORDER — METOPROLOL TARTRATE 25 MG PO TABS
25.0000 mg | ORAL_TABLET | Freq: Two times a day (BID) | ORAL | Status: DC
  Administered 2018-07-08 – 2018-07-12 (×9): 25 mg via ORAL
  Filled 2018-07-08 (×9): qty 1

## 2018-07-08 MED ORDER — HYDROXYZINE HCL 25 MG PO TABS
25.0000 mg | ORAL_TABLET | Freq: Two times a day (BID) | ORAL | Status: DC
  Administered 2018-07-08 – 2018-07-12 (×9): 25 mg via ORAL
  Filled 2018-07-08 (×9): qty 1

## 2018-07-08 MED ORDER — DICLOFENAC SODIUM 1 % TD GEL
2.0000 g | Freq: Two times a day (BID) | TRANSDERMAL | Status: DC
  Administered 2018-07-08 – 2018-07-12 (×9): 2 g via TOPICAL
  Filled 2018-07-08: qty 100

## 2018-07-08 MED ORDER — PREDNISONE 5 MG PO TABS
15.0000 mg | ORAL_TABLET | Freq: Every day | ORAL | Status: DC
  Administered 2018-07-08 – 2018-07-12 (×5): 15 mg via ORAL
  Filled 2018-07-08 (×5): qty 1

## 2018-07-08 MED ORDER — NALOXONE HCL 0.4 MG/ML IJ SOLN: 0.4000 mg | INTRAMUSCULAR | Status: DC | PRN

## 2018-07-08 MED ORDER — OXYCODONE-ACETAMINOPHEN 5-325 MG PO TABS
1.0000 | ORAL_TABLET | Freq: Once | ORAL | Status: AC
  Administered 2018-07-08: 1 via ORAL
  Filled 2018-07-08: qty 1

## 2018-07-08 MED ORDER — CALCITONIN (SALMON) 200 UNIT/ACT NA SOLN
1.0000 | Freq: Every day | NASAL | Status: DC
  Administered 2018-07-08 – 2018-07-12 (×5): 1 via NASAL
  Filled 2018-07-08: qty 3.7

## 2018-07-08 MED ORDER — DOCUSATE SODIUM 100 MG PO CAPS: 100.0000 mg | ORAL_CAPSULE | Freq: Every evening | ORAL | Status: DC | PRN

## 2018-07-08 NOTE — Progress Notes (Signed)
PT Cancellation Note  Patient Details Name: Alexis Higgins MRN: [id number] DOB: 1941-03-15   Cancelled Treatment:    Reason Eval/Treat Not Completed: Pain limiting ability to participate. Husband and pt refuse PT due to pt's pain. Spoke to husband and pt about possible equipment needs at home. Until late December pt was able to perform stand pivot transfer with min assist from husband. Since that time she has had incr pain and has become very weak and immobile. Since return from SNF 1 week ago pt has essentially remained in recliner at home. Asked husband about hoyer lift for home use and he says they can't afford it. Husband says medicare only covers 80%. I do not know the specifics of renting/buying a hoyer lift at home so I do not know if there are other payment options. Pt's husband requested assistance at home. Pt and husband have been reluctant for therapy to assist in the past. My best recommendation at this time is to look into the option for a hoyer lift and options for payment. Will continue attempts.   [place] Pager [phone] Office [place] 07/08/2018, 10:49 AM

## 2018-07-08 NOTE — Progress Notes (Signed)
  Received consult for "chronic pain".  Staffed with supervising physician, Dr. Micheline Rough chronic pain referral.  Per chart review, patient is currently a client of [facility].  Imaging reveals old healed left rib fractures.  Per MAR, patient is on pain medication as well as steroids.  Spoke to attending and explained that chronic pain management was not under the purview of palliative medicine and would recommend upon discharge to go back to [facility]  Thank you, Romona Curls, NP

## 2018-07-08 NOTE — Plan of Care (Signed)
  Problem: Education: Goal: Knowledge of General Education information will improve Description Including pain rating scale, medication(s)/side effects and non-pharmacologic comfort measures Outcome: Progressing   Problem: Clinical Measurements: Goal: Ability to maintain clinical measurements within normal limits will improve Outcome: Progressing Goal: Respiratory complications will improve Outcome: Progressing Goal: Cardiovascular complication will be avoided Outcome: Progressing   Problem: Coping: Goal: Level of anxiety will decrease Outcome: Progressing   Problem: Elimination: Goal: Will not experience complications related to urinary retention Outcome: Progressing

## 2018-07-08 NOTE — Progress Notes (Signed)
Patient was admitted earlier today.   Patient has osteoporosis, chronic pain and pathological fractures. Patient presents with left-sided rib cage pain. Work-up is in progress. Patient's pain is been optimized. Patient remains on prednisone. Patient is also on opiates. Patient and the patient's husband understand that opiates can cause respiratory decompensation. Patient and patient's husband are willing to proceed with opiate treatment. Patient could not lay flat for MRI due to pain and discomfort.  As per H&P done earlier today "Alexis Higgins is a 78 y.o. female with history of COPD on chronic prednisone therapy, CAD, diastolic dysfunction, chronic anemia was brought to the ER after patient has been having increasing pain in the left side mostly in the lower left ribs for the last 2 to 3 days.  Patient was recently admitted for COPD exacerbation and discharged to rehab.  Patient was discharged from rehab last week.  Has been having increasing episodes of pain for which patient's Norco was made as scheduled dose.  Last 2 days patient's pain worsened and came to the ER.  Has not had any fall or trauma.  Since recent admission patient has become more bedbound.  During recent admission patient also had complained of right groin pain and MRI had shown right sacroiliac stress fracture.  ED Course: In the ER CT abdomen does not show anything acute.  X-ray of the chest does not show anything acute.  ER physician discussed with radiologist who felt that patient had a possible left eighth rib fracture.  Dedicated x-rays are pending.  Since patient's pain also radiates to the back and has had previous history of thoracic spine fractures will get MRI T-spine.  Due to intractable pain patient admitted for further pain management".  Goal is to optimize pain control. Further management will depend on hospital course.

## 2018-07-08 NOTE — H&P (Signed)
History and Physical    Alexis Higgins YSA:[id number] DOB: 05-16-1941 DOA: 07/07/2018  PCP: Biagio Borg, MD  Patient coming from: Home.  Chief Complaint: Left-sided pain.  HPI: Alexis Higgins is a 78 y.o. female with history of COPD on chronic prednisone therapy, CAD, diastolic dysfunction, chronic anemia was brought to the ER after patient has been having increasing pain in the left side mostly in the lower left ribs for the last 2 to 3 days.  Patient was recently admitted for COPD exacerbation and discharged to rehab.  Patient was discharged from rehab last week.  Has been having increasing episodes of pain for which patient's Norco was made as scheduled dose.  Last 2 days patient's pain worsened and came to the ER.  Has not had any fall or trauma.  Since recent admission patient has become more bedbound.  During recent admission patient also had complained of right groin pain and MRI had shown right sacroiliac stress fracture.  ED Course: In the ER CT abdomen does not show anything acute.  X-ray of the chest does not show anything acute.  ER physician discussed with radiologist who felt that patient had a possible left eighth rib fracture.  Dedicated x-rays are pending.  Since patient's pain also radiates to the back and has had previous history of thoracic spine fractures will get MRI T-spine.  Due to intractable pain patient admitted for further pain management.  Review of Systems: As per HPI, rest all negative.   Past Medical History:  Diagnosis Date  . ANXIETY 01/01/2007  . Blood transfusion without reported diagnosis 2015  . BURSITIS, RIGHT HIP 06/04/2009  . CAD (coronary artery disease)    a. BMS to LAD 2010. b. NSTEMI with DES to LAD for ISR 2011. c. Patent stent 03/2012/Imdur added.  . Cataract   . Colon polyps    H/o tubular adenoma of colon  . COPD 01/01/2007   a. Chronic resp failure on home O2.  Marland Kitchen DEPRESSION 01/01/2007  . Eczema 01/08/2011  . GROIN PAIN 06/20/2008    . Headache(784.0) 01/01/2007  . Hemorrhoid   . HYPERTENSION 01/01/2007  . Impaired glucose tolerance 01/07/2011  . LOW BACK PAIN 01/01/2007  . Muscle weakness (generalized) 06/04/2009  . On home oxygen therapy    "2L; 24/7" (03/21/2018)  . OSTEOARTHRITIS, HIP 07/01/2008  . OSTEOPOROSIS 01/01/2007  . Pneumonia 1998  . Rosacea 01/08/2011  . SYNCOPE 01/01/2007  . Thoracic compression fracture ([place])   . TRANSIENT ISCHEMIC ATTACK, HX OF 01/01/2007    Past Surgical History:  Procedure Laterality Date  . ABDOMINAL HYSTERECTOMY    . APPENDECTOMY    . BACK SURGERY    . BREAST ENHANCEMENT SURGERY    . COLONOSCOPY  01/25/2002   tubular adenoma,hemorrhoids, hyperplastic  colon polyps  . COLONOSCOPY  02/17/2005   hemorrhoids  . CORONARY ANGIOPLASTY    . CORONARY STENT PLACEMENT    . ESOPHAGOGASTRODUODENOSCOPY (EGD) WITH PROPOFOL N/A 08/10/2017   Procedure: ESOPHAGOGASTRODUODENOSCOPY (EGD) WITH PROPOFOL;  Surgeon: Gatha Mayer, MD;  Location: [facility];  Service: Endoscopy;  Laterality: N/A;  . HIP ARTHROPLASTY Left 10/01/2013   Procedure: ARTHROPLASTY UNIPOLAR   HIP;  Surgeon: Marianna Payment, MD;  Location: [place];  Service: Orthopedics;  Laterality: Left;  . HIP SURGERY Left    DR XU     PROXIMAL NECK   . IR GENERIC HISTORICAL  02/19/2016   IR VERTEBROPLASTY CERV/THOR BX INC UNI/BIL INC/INJECT/IMAGING 02/19/2016 Luanne Bras, MD [facility]  . IR GENERIC HISTORICAL  07/15/2016   IR KYPHO THORACIC WITH BONE BIOPSY 07/15/2016 Luanne Bras, MD [facility]  . IR KYPHO EA ADDL LEVEL THORACIC OR LUMBAR  09/14/2016  . IR RADIOLOGIST EVAL & MGMT  09/22/2016  . IR VERTEBROPLASTY CERV/THOR BX INC UNI/BIL INC/INJECT/IMAGING  10/08/2016  . IR VERTEBROPLASTY CERV/THOR BX INC UNI/BIL INC/INJECT/IMAGING  11/16/2016  . KYPHOPLASTY  12/2015-10/2016 X 5   "?2lumbar & 3 thoracic"  . LEFT HEART CATHETERIZATION WITH CORONARY ANGIOGRAM N/A 04/13/2012   Procedure: LEFT HEART CATHETERIZATION WITH CORONARY  ANGIOGRAM;  Surgeon: Burnell Blanks, MD;  Location: [facility] CATH LAB;  Service: Cardiovascular;  Laterality: N/A;  . OOPHORECTOMY     one ovary  . s/p bilat cataract  2010  . sp lumbar disc surgury     [name]     reports that she quit smoking about 12 years ago. Her smoking use included cigarettes. She has a 76.50 pack-year smoking history. She has never used smokeless tobacco. She reports that she does not drink alcohol or use drugs.  Allergies  Allergen Reactions  . Aspirin Other (See Comments)     cns bleed risk  . Codeine Anaphylaxis and Rash  . Stiolto Respimat [Tiotropium Bromide-Olodaterol] Other (See Comments)    Caused inability to breath/ severe reaction - reported in 2015    Family History  Problem Relation Age of Onset  . Osteoporosis Mother   . Heart disease Sister   . Emphysema Sister   . Seizures Sister        epilepsy  . Cardiomyopathy Sister   . Heart attack Sister   . Colon cancer Neg Hx   . Colon polyps Neg Hx   . Esophageal cancer Neg Hx   . Rectal cancer Neg Hx   . Stomach cancer Neg Hx     Prior to Admission medications   Medication Sig Start Date End Date Taking? Authorizing Provider  acetaminophen (TYLENOL) 500 MG tablet Take 1,000 mg by mouth every 6 (six) hours.    Yes [provider]  calcitonin, salmon, (MIACALCIN/FORTICAL) 200 UNIT/ACT nasal spray Place 1 spray into alternate nostrils daily. 06/25/18  Yes [provider]  Cholecalciferol (VITAMIN D) 1000 UNITS capsule Take 1,000 Units by mouth every evening.    Yes [provider]  clopidogrel (PLAVIX) 75 MG tablet TAKE 1 TABLET BY MOUTH EVERY DAY Patient taking differently: Take 75 mg by mouth daily.  06/06/18  Yes Burnell Blanks, MD  diclofenac sodium (VOLTAREN) 1 % GEL APPLY 4 GRAMS TOPICALLY 4 TIMES DAILY Patient taking differently: Apply 1 g topically See admin instructions. For chronic back pain - apply with lidocaine patch to lower back twice  daily 04/04/18  Yes Biagio Borg, MD  docusate sodium (COLACE) 100 MG capsule Take 100 mg by mouth at bedtime as needed for mild constipation.    Yes [provider]  escitalopram (LEXAPRO) 5 MG tablet Take 5 mg by mouth daily.   Yes [provider]  furosemide (LASIX) 20 MG tablet Take 1 tablet (20 mg total) by mouth 2 (two) times daily as needed. Patient taking differently: Take 20 mg by mouth 2 (two) times daily as needed. fluid 03/11/17  Yes Biagio Borg, MD  HYDROcodone-acetaminophen (NORCO/VICODIN) 5-325 MG tablet Take 1-2 tablets by mouth every 6 (six) hours as needed (1 tablet - mild to moderate pain, 2 tablets moderate to severe pain).   Yes [provider]  hydrOXYzine (ATARAX/VISTARIL) 25  MG tablet TAKE 1 OR 2 TABLETS BY MOUTH EVERY 8 HOURS AS NEEDED FOR ANXIETY Patient taking differently: Take 25 mg by mouth 2 (two) times daily. For itching 02/28/18  Yes Biagio Borg, MD  isosorbide mononitrate (IMDUR) 30 MG 24 hr tablet TAKE 0.5 TABLETS (15 MG TOTAL) BY MOUTH DAILY. Patient taking differently: Take 15 mg by mouth daily.  04/11/18  Yes Burnell Blanks, MD  lidocaine (LIDODERM) 5 % Place 1 patch onto the skin daily. Remove & Discard patch within 12 hours or as directed by MD 06/13/18  Yes Elodia Florence., MD  lovastatin (MEVACOR) 20 MG tablet TAKE 20 MG BY MOUTH AT BEDTIME Patient taking differently: Take 20 mg by mouth at bedtime.  02/15/18  Yes Biagio Borg, MD  metoprolol tartrate (LOPRESSOR) 25 MG tablet TAKE 1 TABLET BY MOUTH TWICE A DAY NEEDS APPT Patient taking differently: Take 25 mg by mouth 2 (two) times daily.  03/16/18  Yes Burnell Blanks, MD  nitroGLYCERIN (NITROSTAT) 0.4 MG SL tablet Place 1 tablet (0.4 mg total) under the tongue every 5 (five) minutes as needed for chest pain (up to 3 doses). 04/13/12  Yes Dunn, Dayna N, PA-C  ondansetron (ZOFRAN) 4 MG tablet Take 1 tablet (4 mg total) by mouth every 8 (eight) hours as  needed for nausea or vomiting. Patient taking differently: Take 4 mg by mouth 2 (two) times daily.  01/06/16  Yes Mackuen, Courteney Lyn, MD  OXYGEN Inhale 2 L into the lungs continuous.    Yes [provider]  polyethylene glycol (MIRALAX / GLYCOLAX) packet Take 17 g by mouth daily as needed for mild constipation.    Yes [provider]  polyvinyl alcohol (LIQUIFILM TEARS) 1.4 % ophthalmic solution Place 1 drop into both eyes daily as needed for dry eyes.    Yes [provider]  potassium chloride (K-DUR) 10 MEQ tablet Take 1 tablet (10 mEq total) by mouth daily. 08/15/17  Yes Biagio Borg, MD  predniSONE (DELTASONE) 10 MG tablet TAKE 1 & 1/2 TABLET(S) BY MOUTH DAILY WITH BREAKFAST Patient taking differently: Take 15 mg by mouth daily with breakfast.  07/03/18  Yes Byrum, Rose Fillers, MD  PROAIR HFA [phone] Base) MCG/ACT inhaler INHALE 2 PUFFS INTO THE LUNGS EVERY 6 (SIX) HOURS AS NEEDED FOR WHEEZING OR SHORTNESS OF BREATH. 05/09/18  Yes Biagio Borg, MD  tiotropium (SPIRIVA HANDIHALER) 18 MCG inhalation capsule Place 1 capsule (18 mcg total) into inhaler and inhale daily at 6 (six) AM. Patient taking differently: Place 18 mcg into inhaler and inhale daily.  04/11/18  Yes Biagio Borg, MD  traMADol (ULTRAM) 50 MG tablet Take 2 tablets (100 mg total) by mouth 2 (two) times daily. 06/13/18  Yes Elodia Florence., MD  traZODone (DESYREL) 50 MG tablet Take 50 mg by mouth at bedtime.   Yes [provider]  vitamin B-12 (CYANOCOBALAMIN) 1000 MCG tablet Take 1,000 mcg by mouth every evening.    Yes [provider]  vitamin C (ASCORBIC ACID) 500 MG tablet Take 500 mg by mouth every evening.    Yes [provider]    Physical Exam: Vitals:   07/07/18 2300 07/08/18 0000 07/08/18 0030 07/08/18 0139  BP: 124/90 (!) 149/78 (!) 168/88 (!) 118/104  Pulse: 80   89  Resp: 12 13 15    Temp:    98.3 F (36.8 C)  TempSrc:    Oral  SpO2: 98%  99%       Constitutional: Moderately built and nourished. Vitals:   07/07/18 2300 07/08/18 0000 07/08/18 0030 07/08/18 0139  BP: 124/90 (!) 149/78 (!) 168/88 (!) 118/104  Pulse: 80   89  Resp: 12 13 15    Temp:    98.3 F (36.8 C)  TempSrc:    Oral  SpO2: 98%   99%   Eyes: Anicteric no pallor. ENMT: No discharge from the ears eyes nose and mouth. Neck: No mass felt.  No neck rigidity. Respiratory: No rhonchi or crepitations. Cardiovascular: S1-S2 heard. Abdomen: Soft nontender bowel sounds present. Musculoskeletal: No edema. Skin: Ecchymotic areas on the left chest. Neurologic: Alert awake oriented to time place and person.  Moves all extremities. Psychiatric: Appears normal.   Labs on Admission: I have personally reviewed following labs and imaging studies  CBC: Recent Labs  Lab 07/07/18 1956  WBC 17.2*  HGB 11.9*  HCT 37.4  MCV 89.7  PLT 161   Basic Metabolic Panel: Recent Labs  Lab 07/07/18 1956  NA 134*  K 5.0  CL 95*  CO2 27  GLUCOSE 130*  BUN 13  CREATININE 0.85  CALCIUM 8.7*   GFR: Estimated Creatinine Clearance: 46 mL/min (by C-G formula based on SCr of 0.85 mg/dL). Liver Function Tests: Recent Labs  Lab 07/07/18 1956  AST 18  ALT 15  ALKPHOS 171*  BILITOT 0.9  PROT 5.3*  ALBUMIN 3.0*   Recent Labs  Lab 07/07/18 1956  LIPASE 19   No results for input(s): AMMONIA in the last 168 hours. Coagulation Profile: No results for input(s): INR, PROTIME in the last 168 hours. Cardiac Enzymes: No results for input(s): CKTOTAL, CKMB, CKMBINDEX, TROPONINI in the last 168 hours. BNP (last 3 results) No results for input(s): PROBNP in the last 8760 hours. HbA1C: No results for input(s): HGBA1C in the last 72 hours. CBG: No results for input(s): GLUCAP in the last 168 hours. Lipid Profile: No results for input(s): CHOL, HDL, LDLCALC, TRIG, CHOLHDL, LDLDIRECT in the last 72 hours. Thyroid Function Tests: No results for input(s): TSH, T4TOTAL,  FREET4, T3FREE, THYROIDAB in the last 72 hours. Anemia Panel: No results for input(s): VITAMINB12, FOLATE, FERRITIN, TIBC, IRON, RETICCTPCT in the last 72 hours. Urine analysis:    Component Value Date/Time   COLORURINE YELLOW 07/07/2018 2030   [place] 07/07/2018 2030   [place] 1.012 07/07/2018 2030   [place] 6.0 07/07/2018 2030   GLUCOSEU NEGATIVE 07/07/2018 2030   GLUCOSEU NEGATIVE 02/13/2018 1408   HGBUR NEGATIVE 07/07/2018 2030   [place] NEGATIVE 07/07/2018 2030   [place] 07/07/2018 2030   PROTEINUR NEGATIVE 07/07/2018 2030   UROBILINOGEN 0.2 02/13/2018 1408   NITRITE NEGATIVE 07/07/2018 2030   LEUKOCYTESUR NEGATIVE 07/07/2018 2030   Sepsis Labs: @LABRCNTIP (procalcitonin:4,lacticidven:4) )No results found for this or any previous visit (from the past 240 hour(s)).   Radiological Exams on Admission: Dg Chest Port 1 View  Result Date: 07/08/2018 CLINICAL DATA:  Shortness of breath. EXAM: PORTABLE CHEST 1 VIEW COMPARISON:  June 24, 2018 FINDINGS: The heart size and mediastinal contours are within normal limits. Both lungs are clear. Chronic deformity of several left upper ribs are identified. Thoracic vertebral plasties are identified. IMPRESSION: No active cardiopulmonary disease. Electronically Signed   By: Abelardo Diesel M.D.   On: 07/08/2018 01:05   Ct Renal Stone Study  Result Date: 07/07/2018 CLINICAL DATA:  Flank pain EXAM: CT ABDOMEN AND PELVIS WITHOUT CONTRAST TECHNIQUE: Multidetector CT imaging of the abdomen and pelvis was  performed following the standard protocol without IV contrast. COMPARISON:  06/21/2017 FINDINGS: LOWER CHEST: Moderate emphysema. HEPATOBILIARY: The hepatic contours and density are normal. There is no intra- or extrahepatic biliary dilatation. The gallbladder is normal. PANCREAS: The pancreatic parenchymal contours are normal and there is no ductal dilatation. There is no peripancreatic fluid collection. SPLEEN: Normal.  ADRENALS/URINARY TRACT: --Adrenal glands: Normal. --Right kidney/ureter: No hydronephrosis, nephroureterolithiasis, perinephric stranding or solid renal mass. --Left kidney/ureter: No hydronephrosis, nephroureterolithiasis, perinephric stranding or solid renal mass. --Urinary bladder: Normal for degree of distention STOMACH/BOWEL: --Stomach/Duodenum: There is no hiatal hernia or other gastric abnormality. The duodenal course and caliber are normal. --Small bowel: No dilatation or inflammation. --Colon: No focal abnormality. --Appendix: Not visualized. No right lower quadrant inflammation or free fluid. VASCULAR/LYMPHATIC: There is aortic atherosclerosis without hemodynamically significant stenosis. No abdominal or pelvic lymphadenopathy. REPRODUCTIVE: Status post hysterectomy. No adnexal mass. MUSCULOSKELETAL. No bony spinal canal stenosis or focal osseous abnormality. Left hip arthroplasty. OTHER: None. IMPRESSION: No acute abdominal or pelvic abnormality Aortic Atherosclerosis (ICD10-I70.0) and Emphysema (ICD10-J43.9). Electronically Signed   By: Ulyses Jarred M.D.   On: 07/07/2018 22:07     Assessment/Plan Principal Problem:   Left flank pain Active Problems:   Hyperlipidemia   Essential hypertension   CAD, NATIVE VESSEL   COPD (chronic obstructive pulmonary disease) with emphysema (HCC)   Pain    1. Left-sided lower rib pain -Per ER physician will discuss with radiologist pain could be from the left eighth rib fracture.  Dedicated x-ray of ribs are pending.  MRI T-spine has been ordered.  For now on IV fentanyl.  I will consult palliative care for pain management.  Patient probably may need Duragesic patch.  Bowel regimen. 2. COPD not actively wheezing. 3. CAD on Plavix statins beta-blockers Imdur. 4. Chronic anemia follow CBC. 5. Hypertension on metoprolol. 6. Recent stress fracture of right sacroiliac.   DVT prophylaxis: Lovenox. Code Status: Full code. Family Communication: Patient's  husband. Disposition Plan: To be determined. Consults called: Physical therapy.  Palliative care. Admission status: Observation.   [name] MD [facility] Pager [phone].  If 7PM-7AM, please contact night-coverage [email or web address] Password [facility]  07/08/2018, 4:03 AM

## 2018-07-08 NOTE — Progress Notes (Signed)
0130 received patient from ED A&O x4. Skin tears bilateral arms evaluated and redressed. Patient complained of pain 7/10, had pain med admin 30 min before in ED. Husband upset that he was asked to stay in family waiting area while patient was assessed, explained was only to assess and care for patient with several staff in the room.  Spouse expressed concern that patient was continuing to have pain that he was not able to manage at home. Will continue to monitor.

## 2018-07-09 DIAGNOSIS — I252 Old myocardial infarction: Secondary | ICD-10-CM | POA: Diagnosis not present

## 2018-07-09 DIAGNOSIS — E785 Hyperlipidemia, unspecified: Secondary | ICD-10-CM | POA: Diagnosis not present

## 2018-07-09 DIAGNOSIS — F419 Anxiety disorder, unspecified: Secondary | ICD-10-CM | POA: Diagnosis present

## 2018-07-09 DIAGNOSIS — J439 Emphysema, unspecified: Secondary | ICD-10-CM | POA: Diagnosis not present

## 2018-07-09 DIAGNOSIS — R1031 Right lower quadrant pain: Secondary | ICD-10-CM | POA: Diagnosis not present

## 2018-07-09 DIAGNOSIS — M5489 Other dorsalgia: Secondary | ICD-10-CM | POA: Diagnosis not present

## 2018-07-09 DIAGNOSIS — K5900 Constipation, unspecified: Secondary | ICD-10-CM | POA: Diagnosis not present

## 2018-07-09 DIAGNOSIS — Z955 Presence of coronary angioplasty implant and graft: Secondary | ICD-10-CM | POA: Diagnosis not present

## 2018-07-09 DIAGNOSIS — Z515 Encounter for palliative care: Secondary | ICD-10-CM | POA: Diagnosis not present

## 2018-07-09 DIAGNOSIS — F329 Major depressive disorder, single episode, unspecified: Secondary | ICD-10-CM | POA: Diagnosis present

## 2018-07-09 DIAGNOSIS — J9610 Chronic respiratory failure, unspecified whether with hypoxia or hypercapnia: Secondary | ICD-10-CM | POA: Diagnosis not present

## 2018-07-09 DIAGNOSIS — Z8673 Personal history of transient ischemic attack (TIA), and cerebral infarction without residual deficits: Secondary | ICD-10-CM | POA: Diagnosis not present

## 2018-07-09 DIAGNOSIS — F40240 Claustrophobia: Secondary | ICD-10-CM | POA: Diagnosis present

## 2018-07-09 DIAGNOSIS — Z7401 Bed confinement status: Secondary | ICD-10-CM | POA: Diagnosis not present

## 2018-07-09 DIAGNOSIS — I10 Essential (primary) hypertension: Secondary | ICD-10-CM | POA: Diagnosis not present

## 2018-07-09 DIAGNOSIS — M545 Low back pain: Secondary | ICD-10-CM | POA: Diagnosis not present

## 2018-07-09 DIAGNOSIS — M7918 Myalgia, other site: Secondary | ICD-10-CM | POA: Diagnosis not present

## 2018-07-09 DIAGNOSIS — H269 Unspecified cataract: Secondary | ICD-10-CM | POA: Diagnosis not present

## 2018-07-09 DIAGNOSIS — M2550 Pain in unspecified joint: Secondary | ICD-10-CM | POA: Diagnosis not present

## 2018-07-09 DIAGNOSIS — M1610 Unilateral primary osteoarthritis, unspecified hip: Secondary | ICD-10-CM | POA: Diagnosis not present

## 2018-07-09 DIAGNOSIS — J449 Chronic obstructive pulmonary disease, unspecified: Secondary | ICD-10-CM | POA: Diagnosis not present

## 2018-07-09 DIAGNOSIS — M810 Age-related osteoporosis without current pathological fracture: Secondary | ICD-10-CM | POA: Diagnosis not present

## 2018-07-09 DIAGNOSIS — D649 Anemia, unspecified: Secondary | ICD-10-CM | POA: Diagnosis not present

## 2018-07-09 DIAGNOSIS — J9600 Acute respiratory failure, unspecified whether with hypoxia or hypercapnia: Secondary | ICD-10-CM | POA: Diagnosis not present

## 2018-07-09 DIAGNOSIS — I110 Hypertensive heart disease with heart failure: Secondary | ICD-10-CM | POA: Diagnosis not present

## 2018-07-09 DIAGNOSIS — Z87891 Personal history of nicotine dependence: Secondary | ICD-10-CM | POA: Diagnosis not present

## 2018-07-09 DIAGNOSIS — I2510 Atherosclerotic heart disease of native coronary artery without angina pectoris: Secondary | ICD-10-CM | POA: Diagnosis not present

## 2018-07-09 DIAGNOSIS — R109 Unspecified abdominal pain: Secondary | ICD-10-CM | POA: Diagnosis not present

## 2018-07-09 DIAGNOSIS — I201 Angina pectoris with documented spasm: Secondary | ICD-10-CM | POA: Diagnosis not present

## 2018-07-09 DIAGNOSIS — I5032 Chronic diastolic (congestive) heart failure: Secondary | ICD-10-CM | POA: Diagnosis not present

## 2018-07-09 LAB — CBC WITH DIFFERENTIAL/PLATELET
Abs Immature Granulocytes: 0.24 10*3/uL — ABNORMAL HIGH (ref 0.00–0.07)
Basophils Absolute: 0 10*3/uL (ref 0.0–0.1)
Basophils Relative: 0 %
Eosinophils Absolute: 0 10*3/uL (ref 0.0–0.5)
Eosinophils Relative: 0 %
HCT: 37.3 % (ref 36.0–46.0)
Hemoglobin: 11.6 g/dL — ABNORMAL LOW (ref 12.0–15.0)
Immature Granulocytes: 1 %
Lymphocytes Relative: 7 %
Lymphs Abs: 1.2 10*3/uL (ref 0.7–4.0)
MCH: 27.8 pg (ref 26.0–34.0)
MCHC: 31.1 g/dL (ref 30.0–36.0)
MCV: 89.4 fL (ref 80.0–100.0)
Monocytes Absolute: 1.2 10*3/uL — ABNORMAL HIGH (ref 0.1–1.0)
Monocytes Relative: 7 %
Neutro Abs: 14.1 10*3/uL — ABNORMAL HIGH (ref 1.7–7.7)
Neutrophils Relative %: 85 %
Platelets: 289 10*3/uL (ref 150–400)
RBC: 4.17 MIL/uL (ref 3.87–5.11)
RDW: 15.4 % (ref 11.5–15.5)
WBC: 16.8 10*3/uL — ABNORMAL HIGH (ref 4.0–10.5)
nRBC: 0 % (ref 0.0–0.2)

## 2018-07-09 MED ORDER — TIOTROPIUM BROMIDE MONOHYDRATE 18 MCG IN CAPS
18.0000 ug | ORAL_CAPSULE | Freq: Every day | RESPIRATORY_TRACT | Status: DC
  Administered 2018-07-10 – 2018-07-12 (×3): 18 ug via RESPIRATORY_TRACT
  Filled 2018-07-09: qty 5

## 2018-07-09 MED ORDER — LIDOCAINE 5 % EX PTCH
1.0000 | MEDICATED_PATCH | CUTANEOUS | Status: DC
  Administered 2018-07-09 – 2018-07-11 (×3): 1 via TRANSDERMAL
  Filled 2018-07-09 (×3): qty 1

## 2018-07-09 NOTE — Plan of Care (Signed)
  Problem: Nutrition: Goal: Adequate nutrition will be maintained Outcome: Progressing   Problem: Coping: Goal: Level of anxiety will decrease Outcome: Progressing   Problem: Elimination: Goal: Will not experience complications related to bowel motility Outcome: Progressing Goal: Will not experience complications related to urinary retention Outcome: Progressing   
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
## 2018-07-10 MED ORDER — SENNOSIDES-DOCUSATE SODIUM 8.6-50 MG PO TABS: 2.0000 | ORAL_TABLET | Freq: Every evening | ORAL | Status: DC | PRN

## 2018-07-10 MED ORDER — ENOXAPARIN SODIUM 40 MG/0.4ML ~~LOC~~ SOLN
40.0000 mg | SUBCUTANEOUS | Status: DC
  Filled 2018-07-10: qty 0.4

## 2018-07-10 MED ORDER — POLYETHYLENE GLYCOL 3350 17 G PO PACK: 17.0000 g | PACK | Freq: Every day | ORAL | Status: DC | PRN

## 2018-07-10 MED ORDER — MORPHINE SULFATE (PF) 2 MG/ML IV SOLN: 1.0000 mg | INTRAVENOUS | Status: DC | PRN

## 2018-07-10 MED ORDER — OXYCODONE HCL 5 MG PO TABS
5.0000 mg | ORAL_TABLET | ORAL | Status: DC | PRN
  Administered 2018-07-10: 5 mg via ORAL
  Filled 2018-07-10: qty 1

## 2018-07-10 MED ORDER — TRAMADOL HCL 50 MG PO TABS
50.0000 mg | ORAL_TABLET | Freq: Four times a day (QID) | ORAL | Status: DC | PRN
  Administered 2018-07-10 – 2018-07-12 (×4): 50 mg via ORAL
  Filled 2018-07-10 (×4): qty 1

## 2018-07-10 NOTE — Plan of Care (Signed)
  Problem: Education: Goal: Knowledge of General Education information will improve Description Including pain rating scale, medication(s)/side effects and non-pharmacologic comfort measures Outcome: Progressing   Problem: Clinical Measurements: Goal: Respiratory complications will improve Outcome: Progressing Goal: Cardiovascular complication will be avoided Outcome: Progressing   Problem: Nutrition: Goal: Adequate nutrition will be maintained Outcome: Progressing   Problem: Coping: Goal: Level of anxiety will decrease Outcome: Progressing   Problem: Elimination: Goal: Will not experience complications related to bowel motility Outcome: Progressing Goal: Will not experience complications related to urinary retention Outcome: Progressing   Problem: Chronic Pain Goal: Knowledge of Patient Safety will improve Outcome: Progressing
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
## 2018-07-10 NOTE — Progress Notes (Addendum)
Pt. refused MRI for the morning. Educated pt. and husband about reason for MRI. Pt. and husband feel they would like to speak with the doctor first before getting a MRI.

## 2018-07-10 NOTE — Social Work (Signed)
CSW acknowledging consult for SNF placement. Will follow for therapy recommendations. Need PT/OT eval.    Westley Hummer, MSW, [place] Work [phone]

## 2018-07-10 NOTE — Progress Notes (Signed)
PROGRESS NOTE    Alexis Higgins  BWG:[id number] DOB: [birth year] DOA: 07/07/2018 PCP: Biagio Borg, MD   Brief Narrative:  78 year old with history of COPD on chronic prednisone, CAD, diastolic congestive heart failure, chronic anemia came to the ER with complaints of increasing pain in the left-sided of the chest.  Chest x-ray showed severe osteoporosis in the area and old rib fracture.  Patient was unable to get MRI due to her claustrophobia.  She was seen by physical therapy.  It was recommended by is that we continue with pain management at this time.   Assessment & Plan:   Principal Problem:   Left flank pain Active Problems:   Hyperlipidemia   Essential hypertension   CAD, NATIVE VESSEL   COPD (chronic obstructive pulmonary disease) with emphysema (HCC)   Palliative care encounter   Pain  Left-sided musculoskeletal pain, rib pain Osteoarthritis in this area along with old left rib fracture -Continue pain control.  Unable to get advanced imaging such as MRI at this time due to patient's claustrophobia.  I doubt this will have any additional information as we would still proceed with nonsurgical approach with pain control. - Add tramadol p.o. as needed, oxycodone 5 mg IR PRN, IV morphine PRN.  Discontinue IV fentanyl -Continue OxyContin -PT/OT -Aggressive bowel regimen.  History of COPD -Appears to be stable.  Continue home bronchodilators.  On chronic prednisone.  This further puts her at risk of osteoarthritic/bone disease.  Coronary artery disease - Not having any active chest pain.  Resume home medications.  History of chronic anemia -Hemoglobin appears to be stable at the moment.  Essential hypertension -Continue home routine medications  Recent sacroiliac stress fracture on the right side -Followed outpatient with Dr. Sharol Given.  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  DVT prophylaxis:  Lovenox Code Status: Full code Family Communication: Husband at bedside Disposition Plan: Maintain inpatient stay until we have better pain control and we have a safe disposition plan.  Social worker following.  Consultants:   None  Procedures:   None  Antimicrobials:   None   Subjective: Husband at bedside concerned about her pain and her pain regimen.  Also shows concern about how much he is not able to take care of her at home as she requires quite a bit of assistance.  Review of Systems Otherwise negative except as per HPI, including: General: Denies fever, chills, night sweats or unintended weight loss. Resp: Denies cough, wheezing, shortness of breath. Cardiac: Denies chest pain, palpitations, orthopnea, paroxysmal nocturnal dyspnea. GI: Denies abdominal pain, nausea, vomiting, diarrhea or constipation GU: Denies dysuria, frequency, hesitancy or incontinence MS: Denies muscle aches, joint pain or swelling Neuro: Denies headache, neurologic deficits (focal weakness, numbness, tingling), abnormal gait Psych: Denies anxiety, depression, SI/HI/AVH Skin: Denies new rashes or lesions ID: Denies sick contacts, exotic exposures, travel  Objective: Vitals:   07/09/18 1021 07/09/18 1353 07/09/18 2110 07/10/18 0630  BP: (!) 167/99 (!) 160/92 (!) 167/93 (!) 179/75  Pulse: (!) 103 89 99 87  Resp: 18 18 20 16   Temp: 97.7 F (36.5 C) 98.1 F (36.7 C) 98.4 F (36.9 C) 98.4 F (36.9 C)  TempSrc:  Axillary Oral Oral  SpO2:  99% 100% 100%    Intake/Output Summary (Last 24 hours) at 07/10/2018 1204 Last data filed at 07/10/2018 1133 Gross per 24 hour  Intake 400 ml  Output 1350 ml  Net -950 ml  There were no vitals filed for this visit.  Examination:  General exam: Appears calm and comfortable ; chronically ill and frail; 2L [place] Respiratory system: Clear to auscultation. Respiratory effort normal. Cardiovascular system: S1 & S2 heard, RRR. No JVD, murmurs, rubs, gallops  or clicks. No pedal edema. Gastrointestinal system: Abdomen is nondistended, soft and nontender. No organomegaly or masses felt. Normal bowel sounds heard. Central nervous system: Alert and oriented. No focal neurological deficits. Extremities: Symmetric 3 x 5 power. Skin: No rashes, lesions or ulcers Psychiatry: Judgement and insight appear normal. Mood & affect appropriate.     Data Reviewed:   CBC: Recent Labs  Lab 07/07/18 1956 07/08/18 1058 07/09/18 1053  WBC 17.2* 24.5* 16.8*  NEUTROABS  --  21.0* 14.1*  HGB 11.9* 11.3* 11.6*  HCT 37.4 36.6 37.3  MCV 89.7 88.6 89.4  PLT 290 295 101   Basic Metabolic Panel: Recent Labs  Lab 07/07/18 1956 07/08/18 1058  NA 134* 136  K 5.0 4.5  CL 95* 99  CO2 27 31  GLUCOSE 130* 92  BUN 13 12  CREATININE 0.85 0.89  CALCIUM 8.7* 8.8*  MG  --  1.9  PHOS  --  3.2   GFR: CrCl cannot be calculated (Unknown ideal weight.). Liver Function Tests: Recent Labs  Lab 07/07/18 1956 07/08/18 1058  AST 18  --   ALT 15  --   ALKPHOS 171*  --   BILITOT 0.9  --   PROT 5.3*  --   ALBUMIN 3.0* 2.8*   Recent Labs  Lab 07/07/18 1956  LIPASE 19   No results for input(s): AMMONIA in the last 168 hours. Coagulation Profile: No results for input(s): INR, PROTIME in the last 168 hours. Cardiac Enzymes: No results for input(s): CKTOTAL, CKMB, CKMBINDEX, TROPONINI in the last 168 hours. BNP (last 3 results) No results for input(s): PROBNP in the last 8760 hours. HbA1C: No results for input(s): HGBA1C in the last 72 hours. CBG: No results for input(s): GLUCAP in the last 168 hours. Lipid Profile: No results for input(s): CHOL, HDL, LDLCALC, TRIG, CHOLHDL, LDLDIRECT in the last 72 hours. Thyroid Function Tests: No results for input(s): TSH, T4TOTAL, FREET4, T3FREE, THYROIDAB in the last 72 hours. Anemia Panel: No results for input(s): VITAMINB12, FOLATE, FERRITIN, TIBC, IRON, RETICCTPCT in the last 72 hours. Sepsis Labs: Recent Labs    Lab 07/08/18 1058  PROCALCITON 3.53    No results found for this or any previous visit (from the past 240 hour(s)).       Radiology Studies: No results found.      Scheduled Meds: . calcitonin (salmon)  1 spray Alternating Nares Daily  . cholecalciferol  1,000 Units Oral QPM  . clopidogrel  75 mg Oral Daily  . diclofenac sodium  2 g Topical BID  . escitalopram  5 mg Oral Daily  . hydrOXYzine  25 mg Oral BID  . isosorbide mononitrate  15 mg Oral Daily  . lidocaine  1 patch Transdermal Q24H  . metoprolol tartrate  25 mg Oral BID  . ondansetron  4 mg Oral BID  . oxyCODONE  10 mg Oral Q12H  . pravastatin  20 mg Oral q1800  . predniSONE  15 mg Oral Q breakfast  . sodium chloride flush  3 mL Intravenous Once  . tiotropium  18 mcg Inhalation Daily  . traZODone  50 mg Oral QHS  . vitamin B-12  1,000 mcg Oral QPM  . vitamin C  500 mg Oral  QPM   Continuous Infusions:   LOS: 1 day   Time spent= 35 mins    Jackline Castilla Arsenio Loader, MD [facility]  If 7PM-7AM, please contact night-coverage [email or web address] 07/10/2018, 12:04 PM

## 2018-07-11 LAB — BASIC METABOLIC PANEL
Anion gap: 8 (ref 5–15)
BUN: 13 mg/dL (ref 8–23)
CO2: 33 mmol/L — ABNORMAL HIGH (ref 22–32)
CREATININE: 0.81 mg/dL (ref 0.44–1.00)
Calcium: 8.5 mg/dL — ABNORMAL LOW (ref 8.9–10.3)
Chloride: 97 mmol/L — ABNORMAL LOW (ref 98–111)
GFR calc non Af Amer: >60 mL/min (ref >60)
Glucose, Bld: 112 mg/dL — ABNORMAL HIGH (ref 70–99)
Potassium: 4.1 mmol/L (ref 3.5–5.1)
Sodium: 138 mmol/L (ref 135–145)

## 2018-07-11 LAB — MAGNESIUM: Magnesium: 2.1 mg/dL (ref 1.7–2.4)

## 2018-07-11 LAB — CBC
HCT: 33.3 % — ABNORMAL LOW (ref 36.0–46.0)
Hemoglobin: 10.2 g/dL — ABNORMAL LOW (ref 12.0–15.0)
MCH: 27.6 pg (ref 26.0–34.0)
MCHC: 30.6 g/dL (ref 30.0–36.0)
MCV: 90.2 fL (ref 80.0–100.0)
Platelets: 230 10*3/uL (ref 150–400)
RBC: 3.69 MIL/uL — ABNORMAL LOW (ref 3.87–5.11)
RDW: 15.1 % (ref 11.5–15.5)
WBC: 9.2 10*3/uL (ref 4.0–10.5)
nRBC: 0 % (ref 0.0–0.2)

## 2018-07-11 NOTE — Care Management (Cosign Needed)
    Durable Medical Equipment  (From admission, onward)         Start     Ordered   07/11/18 0952  For home use only DME Hospital bed  Once    Comments:  Call patient's husband Mozel Burdett [phone] to discuss cost and delivery THANKS    Left-sided musculoskeletal pain, rib pain Osteoarthritis in this area along with old left rib fracture  Question Answer Comment  Patient has (list medical condition): Left-sided musculoskeletal pain, rib pain   The above medical condition requires: Patient requires the ability to reposition frequently   Head must be elevated greater than: 45 degrees   Bed type Semi-electric   Hoyer Lift Yes      07/11/18 [phone]

## 2018-07-11 NOTE — Care Management Note (Addendum)
Case Management Note  Patient Details  Name: ARLOA PRAK MRN: [id number] Date of Birth: 16-Apr-1941  Subjective/Objective:                    Action/Plan:  Discussed discharge planning at bedside with  Patient and husband Percell Miller.   Discussed PT recommendation SNF, husband states understanding but wants to take his wife home with Home health through [facility]. Percell Miller states patient is already active with [facility]. NCM will ask MD for resumption of care orders.   Confirmed face sheet information.   Patient already has home oxygen through [facility], portable concentrator  at bedside.PAtient has wheelchair at home also.    Percell Miller is interested in cost of hoyer lift and hospital bed. NCM will ask MD for orders and than ask [facility] to speak to [place] regarding cost, at that point Percell Miller will decide if he wants DME or not .    Edward voiced understanding.   Cory with [facility] aware of patient admission.  Patient will need [facility] transportation home at discharge. Confirmed face sheet address with Percell Miller. Edward cell phone is [phone]. Expected Discharge Date:                  Expected Discharge Plan:  [place]  In-House Referral:  Clinical Social Work  Discharge planning Services  CM Consult  Post Acute Care Choice:  Durable Medical Equipment, Home Health Choice offered to:  Spouse, Patient  DME Arranged:  Hospital bed DME Agency:  [place]:   PT, OT, RN, SW aide [place] Agency:  [place]  Status of Service:  In process, will continue to follow  If discussed at Long Length of Stay Meetings, dates discussed:    Additional Comments:  Marilu Favre, RN 07/11/2018, 9:45 AM

## 2018-07-11 NOTE — Progress Notes (Signed)
PROGRESS NOTE    Alexis Higgins  ZWC:[id number] DOB: 05-Jan-1941 DOA: 07/07/2018 PCP: Biagio Borg, MD   Brief Narrative:  78 year old with history of COPD on chronic prednisone, CAD, diastolic congestive heart failure, chronic anemia came to the ER with complaints of increasing pain in the left-sided of the chest.  Chest x-ray showed severe osteoporosis in the area and old rib fracture.  Patient was unable to get MRI due to her claustrophobia.  She was seen by physical therapy.  It was recommended by is that we continue with pain management at this time.  Currently her pain is well controlled on OxyContin, oxycodone, as needed tramadol and Tylenol.  She has not been requiring much of IV morphine.  Home health arrangements are being made.   Assessment & Plan:   Principal Problem:   Left flank pain Active Problems:   Hyperlipidemia   Essential hypertension   CAD, NATIVE VESSEL   COPD (chronic obstructive pulmonary disease) with emphysema (HCC)   Palliative care encounter   Pain  Left-sided musculoskeletal pain, rib pain Osteoarthritis in this area along with old left rib fracture -Continue pain control.  Unable to get advanced imaging such as MRI at this time due to patient's claustrophobia.  I doubt this will have any additional information as we would still proceed with nonsurgical approach with pain control. - Pain is better controlled on OxyContin 10 mg every 12 hours, PRN oral tramadol and as needed oral oxycodone IR and PRN oral Tylenol which she can also use intermittently as necessary.  Advised to use bowel regimen. -PT-Home health arrangements are being made by the case manager.  Will resume outpatient by our home health.  Does not want occupational therapy at this time. -Aggressive bowel regimen.  History of COPD -Appears to be stable.  Continue home bronchodilators.  On chronic prednisone.  This further puts her at risk of osteoarthritic/bone disease.  Coronary artery  disease - Not having any active chest pain.  Resume home medications.  History of chronic anemia -Hemoglobin appears to be stable at the moment.  Essential hypertension -Continue home routine medications  Recent sacroiliac stress fracture on the right side -Followed outpatient with Dr. Sharol Given.  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  DVT prophylaxis: Lovenox Code Status: Full code Family Communication: Husband at bedside Disposition Plan: We will focus on ensuring her pain control today.  Also arrangements for her home equipments are being made today by the case manager.  Hopefully we can discharge her next 24 hours once his arrangements are made.  Home health can be resumed at home.  Consultants:   None  Procedures:   None  Antimicrobials:   None   Subjective: Reports the pain is better controlled on the above regimen.  Still experiences pain with movement as expected but nothing compared to what she used to.  Review of Systems Otherwise negative except as per HPI, including: General = no fevers, chills, dizziness, malaise, fatigue HEENT/EYES = negative for pain, redness, loss of vision, double vision, blurred vision, loss of hearing, sore throat, hoarseness, dysphagia Cardiovascular= negative for chest pain, palpitation, murmurs, lower extremity swelling Respiratory/lungs= negative for shortness of breath, cough, hemoptysis, wheezing, mucus production Gastrointestinal= negative for nausea, vomiting,, abdominal pain, melena, hematemesis Genitourinary= negative for Dysuria, Hematuria, Change in Urinary Frequency MSK = Negative for arthralgia, myalgias, Back Pain, Joint swelling  Neurology= Negative for headache, seizures, numbness, tingling  Psychiatry= Negative  for anxiety, depression, suicidal and homocidal ideation Allergy/Immunology= Medication/Food allergy as listed  Skin= Negative for Rash,  lesions, ulcers, itching   Objective: Vitals:   07/10/18 1442 07/10/18 2216 07/11/18 0640 07/11/18 0831  BP: 138/87 (!) 153/92 140/76   Pulse: 89 100 86   Resp: 18 18 18    Temp: 99.1 F (37.3 C) 97.8 F (36.6 C) 98.2 F (36.8 C)   TempSrc: Oral Oral Oral   SpO2: 99% 100% 100% 99%    Intake/Output Summary (Last 24 hours) at 07/11/2018 1118 Last data filed at 07/11/2018 1047 Gross per 24 hour  Intake 360 ml  Output 450 ml  Net -90 ml   There were no vitals filed for this visit.  Examination:  Constitutional: NAD, calm, comfortable, chronically ill and frail appearing.  On 3 L nasal cannula Eyes: PERRL, lids and conjunctivae normal ENMT: Mucous membranes are moist. Posterior pharynx clear of any exudate or lesions.Normal dentition.  Neck: normal, supple, no masses, no thyromegaly Respiratory: clear to auscultation bilaterally, no wheezing, no crackles. Normal respiratory effort. No accessory muscle use.  Cardiovascular: Regular rate and rhythm, no murmurs / rubs / gallops. No extremity edema. 2+ pedal pulses. No carotid bruits.  Abdomen: no tenderness, no masses palpated. No hepatosplenomegaly. Bowel sounds positive.  Musculoskeletal: no clubbing / cyanosis. No joint deformity upper and lower extremities. Good ROM, no contractures. Normal muscle tone.  Skin: no rashes, lesions, ulcers. No induration Neurologic: CN 2-12 grossly intact. Sensation intact, DTR normal. Strength 3/5 in all 4.  Psychiatric: Normal judgment and insight. Alert and oriented x 3. Normal mood.    Data Reviewed:   CBC: Recent Labs  Lab 07/07/18 1956 07/08/18 1058 07/09/18 1053 07/11/18 0136  WBC 17.2* 24.5* 16.8* 9.2  NEUTROABS  --  21.0* 14.1*  --   HGB 11.9* 11.3* 11.6* 10.2*  HCT 37.4 36.6 37.3 33.3*  MCV 89.7 88.6 89.4 90.2  PLT 290 295 289 629   Basic Metabolic Panel: Recent Labs  Lab 07/07/18 1956 07/08/18 1058 07/11/18 0136  NA 134* 136 138  K 5.0 4.5 4.1  CL 95* 99 97*  CO2 27  31 33*  GLUCOSE 130* 92 112*  BUN 13 12 13   CREATININE 0.85 0.89 0.81  CALCIUM 8.7* 8.8* 8.5*  MG  --  1.9 2.1  PHOS  --  3.2  --    GFR: CrCl cannot be calculated (Unknown ideal weight.). Liver Function Tests: Recent Labs  Lab 07/07/18 1956 07/08/18 1058  AST 18  --   ALT 15  --   ALKPHOS 171*  --   BILITOT 0.9  --   PROT 5.3*  --   ALBUMIN 3.0* 2.8*   Recent Labs  Lab 07/07/18 1956  LIPASE 19   No results for input(s): AMMONIA in the last 168 hours. Coagulation Profile: No results for input(s): INR, PROTIME in the last 168 hours. Cardiac Enzymes: No results for input(s): CKTOTAL, CKMB, CKMBINDEX, TROPONINI in the last 168 hours. BNP (last 3 results) No results for input(s): PROBNP in the last 8760 hours. HbA1C: No results for input(s): HGBA1C in the last 72 hours. CBG: No results for input(s): GLUCAP in the last 168 hours. Lipid Profile: No results for input(s): CHOL, HDL, LDLCALC, TRIG, CHOLHDL, LDLDIRECT in the last 72 hours. Thyroid Function Tests: No results for input(s): TSH, T4TOTAL, FREET4, T3FREE, THYROIDAB in the last 72 hours. Anemia Panel: No results for input(s): VITAMINB12, FOLATE, FERRITIN, TIBC, IRON, RETICCTPCT in the last 72 hours. Sepsis  Labs: Recent Labs  Lab 07/08/18 1058  PROCALCITON 3.53    No results found for this or any previous visit (from the past 240 hour(s)).       Radiology Studies: No results found.      Scheduled Meds: . calcitonin (salmon)  1 spray Alternating Nares Daily  . cholecalciferol  1,000 Units Oral QPM  . clopidogrel  75 mg Oral Daily  . diclofenac sodium  2 g Topical BID  . enoxaparin (LOVENOX) injection  40 mg Subcutaneous Q24H  . escitalopram  5 mg Oral Daily  . hydrOXYzine  25 mg Oral BID  . isosorbide mononitrate  15 mg Oral Daily  . lidocaine  1 patch Transdermal Q24H  . metoprolol tartrate  25 mg Oral BID  . ondansetron  4 mg Oral BID  . oxyCODONE  10 mg Oral Q12H  . pravastatin  20 mg  Oral q1800  . predniSONE  15 mg Oral Q breakfast  . sodium chloride flush  3 mL Intravenous Once  . tiotropium  18 mcg Inhalation Daily  . traZODone  50 mg Oral QHS  . vitamin B-12  1,000 mcg Oral QPM  . vitamin C  500 mg Oral QPM   Continuous Infusions:   LOS: 2 days   Time spent= 25 mins    Gilberto Streck Arsenio Loader, MD [facility]  If 7PM-7AM, please contact night-coverage [email or web address] 07/11/2018, 11:18 AM

## 2018-07-12 DIAGNOSIS — E785 Hyperlipidemia, unspecified: Secondary | ICD-10-CM

## 2018-07-12 LAB — BASIC METABOLIC PANEL
Anion gap: 8 (ref 5–15)
BUN: 17 mg/dL (ref 8–23)
CO2: 34 mmol/L — ABNORMAL HIGH (ref 22–32)
CREATININE: 0.82 mg/dL (ref 0.44–1.00)
Calcium: 8.8 mg/dL — ABNORMAL LOW (ref 8.9–10.3)
Chloride: 96 mmol/L — ABNORMAL LOW (ref 98–111)
GFR calc Af Amer: >60 mL/min (ref >60)
Glucose, Bld: 103 mg/dL — ABNORMAL HIGH (ref 70–99)
Potassium: 4.4 mmol/L (ref 3.5–5.1)
Sodium: 138 mmol/L (ref 135–145)

## 2018-07-12 LAB — CBC
HCT: 34.3 % — ABNORMAL LOW (ref 36.0–46.0)
Hemoglobin: 10.5 g/dL — ABNORMAL LOW (ref 12.0–15.0)
MCH: 28 pg (ref 26.0–34.0)
MCHC: 30.6 g/dL (ref 30.0–36.0)
MCV: 91.5 fL (ref 80.0–100.0)
Platelets: 249 10*3/uL (ref 150–400)
RBC: 3.75 MIL/uL — ABNORMAL LOW (ref 3.87–5.11)
RDW: 15.3 % (ref 11.5–15.5)
WBC: 9.1 10*3/uL (ref 4.0–10.5)
nRBC: 0 % (ref 0.0–0.2)

## 2018-07-12 LAB — MAGNESIUM: Magnesium: 2 mg/dL (ref 1.7–2.4)

## 2018-07-12 MED ORDER — SENNOSIDES-DOCUSATE SODIUM 8.6-50 MG PO TABS: 2.0000 | ORAL_TABLET | Freq: Every day | ORAL | 0 refills | Status: DC

## 2018-07-12 MED ORDER — OXYCODONE ER 9 MG PO C12A: 9.0000 mg | EXTENDED_RELEASE_CAPSULE | Freq: Two times a day (BID) | ORAL | 0 refills | Status: DC

## 2018-07-12 MED ORDER — OXYCODONE HCL ER 10 MG PO T12A: 10.0000 mg | EXTENDED_RELEASE_TABLET | Freq: Two times a day (BID) | ORAL | 0 refills | Status: DC

## 2018-07-12 NOTE — Progress Notes (Signed)
Discharge instructions reviewed with pt/husband.   Copy of instructions given to pt/husband, scripts were sent to pt's pharmacy electronically by MD.   Pt's husband upset, states her pharmacy had called and her insurance would not cover the oxy script that was sent in.  Dr Lonny Prude was contacted and he called the pt's pharmacy, and was able to resolve the situation and insurance would then cover the medication. Pt/husband informed of this prior to leaving.  [facility] transport here to transport pt to her home (as she is not able to walk or transfer in and out of a car.)  Pt d/c'd with her belongings as taken by husband and PTAR.

## 2018-07-12 NOTE — Care Management Important Message (Signed)
Important Message  Patient Details  Name: Alexis Higgins MRN: [id number] Date of Birth: Oct 03, 1940   Medicare Important Message Given:  Yes    Orbie Pyo 07/12/2018, 1:38 PM

## 2018-07-12 NOTE — Progress Notes (Signed)
PT Cancellation Note  Patient Details Name: Alexis Higgins MRN: [id number] DOB: [birth year]   Cancelled Treatment:    Reason Eval/Treat Not Completed: (P) Other (comment)(Pt awaiting d/c. ) Pt doesn't want to increase pt pain before going home.   Lenell Mcconnell B. Migdalia Dk PT, DPT Acute Rehabilitation Services Pager [phone] Office [phone]    [place] 07/12/2018, 3:24 PM

## 2018-07-12 NOTE — Consult Note (Signed)
   [facility] CM Inpatient Consult   07/12/2018  Alexis Higgins 05-May-1941 [id number]  Patient screened for potential [facility] Care Management services due to unplanned readmission risk score of 31%, extreme, and multiple hospitalizations.   Went to bedside to speak with patient and spouse and explain [facility] Care Management Services. Chaplain was in room at time. Spouse stated that patient will go home with home health services and stated that they don't need any other services. Offered a [facility] magnet and explained that they may call the toll free number to accept services at a later time should they desire. Patient declines services at this time.  Netta Cedars, MSN, [facility] Liaison Nurse Mobile Phone [phone]  Toll free office [phone]

## 2018-07-12 NOTE — Care Management Note (Signed)
Case Management Note  Patient Details  Name: Alexis Higgins MRN: [id number] Date of Birth: 05/04/1941  Subjective/Objective:                    Action/Plan:  Nurse patient and husband ready for discharge. Husband states [facility] has spoke with him regarding cost of hospital bed and hoyer lift and he has not decided if he wants the DME or not but has [facility] phone number if he decides he does want DME. Expected Discharge Date:  07/12/18               Expected Discharge Plan:  [place]  In-House Referral:  Clinical Social Work  Discharge planning Services  CM Consult  Post Acute Care Choice:  Durable Medical Equipment, Home Health Choice offered to:  Spouse, Patient  DME Arranged:  Hospital bed DME Agency:  [place]:  PT, RN, OT, Nurse's Aide, Social Work [place] Agency:  [place]  Status of Service:  Completed, signed off  If discussed at [employer] of Stay Meetings, dates discussed:    Additional Comments:  Marilu Favre, RN 07/12/2018, 2:44 PM
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
## 2018-07-13 ENCOUNTER — Telehealth: Payer: Self-pay | Admitting: *Deleted

## 2018-07-13 NOTE — Telephone Encounter (Signed)
Called pt spoke w/husband verified appt that's made for 07/17/18. Husband states they had to reschedule appt that was supposed to been on 2/14. Took wife back to ED for increase pain. She was admitted and came home yesterday 07/12/18. Complete TCM call below.[name]  Transition Care Management Follow-up Telephone Call   Date discharged? 07/12/18   How have you been since you were released from the hospital? Husband states wife is about the same. She keep saying that she is hurting   Do you understand why you were in the hospital? YES   Do you understand the discharge instructions? YES   Where were you discharged to? Home   Items Reviewed:  Medications reviewed: YES  Allergies reviewed: YES  Dietary changes reviewed: YES, heart healthy  Referrals reviewed: No referral needed   Functional Questionnaire:   Activities of Daily Living (ADLs):   He states she are independent in the following: feeding States she require assistance with the following: ambulation, bathing and hygiene, continence, grooming, toileting and dressing   Any transportation issues/concerns?: NO   Any patient concerns? YES, husband states its hard getting wife up. She is not able to walk, but will try his best to get her to the appt that's schedule for 07/17/18   Confirmed importance and date/time of follow-up visits scheduled YES, 07/17/18  Provider Appointment booked with Dr. Jenny Reichmann   Confirmed with patient if condition begins to worsen call PCP or go to the ER.  Patient was given the office number and encouraged to call back with question or concerns.  : YES

## 2018-07-14 DIAGNOSIS — J439 Emphysema, unspecified: Secondary | ICD-10-CM | POA: Diagnosis not present

## 2018-07-14 DIAGNOSIS — I110 Hypertensive heart disease with heart failure: Secondary | ICD-10-CM | POA: Diagnosis not present

## 2018-07-14 DIAGNOSIS — I2510 Atherosclerotic heart disease of native coronary artery without angina pectoris: Secondary | ICD-10-CM | POA: Diagnosis not present

## 2018-07-14 DIAGNOSIS — M80051D Age-related osteoporosis with current pathological fracture, right femur, subsequent encounter for fracture with routine healing: Secondary | ICD-10-CM | POA: Diagnosis not present

## 2018-07-14 DIAGNOSIS — R0689 Other abnormalities of breathing: Secondary | ICD-10-CM | POA: Diagnosis not present

## 2018-07-14 DIAGNOSIS — R109 Unspecified abdominal pain: Secondary | ICD-10-CM | POA: Diagnosis not present

## 2018-07-14 DIAGNOSIS — R0902 Hypoxemia: Secondary | ICD-10-CM | POA: Diagnosis not present

## 2018-07-14 DIAGNOSIS — J9621 Acute and chronic respiratory failure with hypoxia: Secondary | ICD-10-CM | POA: Diagnosis not present

## 2018-07-14 DIAGNOSIS — M8008XD Age-related osteoporosis with current pathological fracture, vertebra(e), subsequent encounter for fracture with routine healing: Secondary | ICD-10-CM | POA: Diagnosis not present

## 2018-07-14 DIAGNOSIS — J449 Chronic obstructive pulmonary disease, unspecified: Secondary | ICD-10-CM | POA: Diagnosis not present

## 2018-07-16 DIAGNOSIS — I2510 Atherosclerotic heart disease of native coronary artery without angina pectoris: Secondary | ICD-10-CM | POA: Diagnosis not present

## 2018-07-16 DIAGNOSIS — J9610 Chronic respiratory failure, unspecified whether with hypoxia or hypercapnia: Secondary | ICD-10-CM | POA: Diagnosis not present

## 2018-07-17 ENCOUNTER — Ambulatory Visit (INDEPENDENT_AMBULATORY_CARE_PROVIDER_SITE_OTHER): Payer: Medicare Other | Admitting: Internal Medicine

## 2018-07-17 ENCOUNTER — Telehealth: Payer: Self-pay | Admitting: Internal Medicine

## 2018-07-17 ENCOUNTER — Encounter: Payer: Self-pay | Admitting: Internal Medicine

## 2018-07-17 VITALS — BP 152/86 | HR 53 | Temp 98.2°F | Ht 67.0 in

## 2018-07-17 DIAGNOSIS — I2510 Atherosclerotic heart disease of native coronary artery without angina pectoris: Secondary | ICD-10-CM

## 2018-07-17 DIAGNOSIS — G8929 Other chronic pain: Secondary | ICD-10-CM

## 2018-07-17 DIAGNOSIS — I10 Essential (primary) hypertension: Secondary | ICD-10-CM | POA: Diagnosis not present

## 2018-07-17 DIAGNOSIS — I110 Hypertensive heart disease with heart failure: Secondary | ICD-10-CM | POA: Diagnosis not present

## 2018-07-17 DIAGNOSIS — Z8673 Personal history of transient ischemic attack (TIA), and cerebral infarction without residual deficits: Secondary | ICD-10-CM

## 2018-07-17 DIAGNOSIS — H04123 Dry eye syndrome of bilateral lacrimal glands: Secondary | ICD-10-CM

## 2018-07-17 DIAGNOSIS — R5381 Other malaise: Secondary | ICD-10-CM

## 2018-07-17 DIAGNOSIS — M80051D Age-related osteoporosis with current pathological fracture, right femur, subsequent encounter for fracture with routine healing: Secondary | ICD-10-CM | POA: Diagnosis not present

## 2018-07-17 DIAGNOSIS — I272 Pulmonary hypertension, unspecified: Secondary | ICD-10-CM

## 2018-07-17 DIAGNOSIS — E785 Hyperlipidemia, unspecified: Secondary | ICD-10-CM

## 2018-07-17 DIAGNOSIS — F419 Anxiety disorder, unspecified: Secondary | ICD-10-CM

## 2018-07-17 DIAGNOSIS — R1312 Dysphagia, oropharyngeal phase: Secondary | ICD-10-CM

## 2018-07-17 DIAGNOSIS — F329 Major depressive disorder, single episode, unspecified: Secondary | ICD-10-CM

## 2018-07-17 DIAGNOSIS — Z9981 Dependence on supplemental oxygen: Secondary | ICD-10-CM

## 2018-07-17 DIAGNOSIS — M810 Age-related osteoporosis without current pathological fracture: Secondary | ICD-10-CM

## 2018-07-17 DIAGNOSIS — M21371 Foot drop, right foot: Secondary | ICD-10-CM

## 2018-07-17 DIAGNOSIS — K5900 Constipation, unspecified: Secondary | ICD-10-CM

## 2018-07-17 DIAGNOSIS — Z87891 Personal history of nicotine dependence: Secondary | ICD-10-CM

## 2018-07-17 DIAGNOSIS — J9621 Acute and chronic respiratory failure with hypoxia: Secondary | ICD-10-CM | POA: Diagnosis not present

## 2018-07-17 DIAGNOSIS — M8008XD Age-related osteoporosis with current pathological fracture, vertebra(e), subsequent encounter for fracture with routine healing: Secondary | ICD-10-CM | POA: Diagnosis not present

## 2018-07-17 DIAGNOSIS — Z87440 Personal history of urinary (tract) infections: Secondary | ICD-10-CM

## 2018-07-17 DIAGNOSIS — J439 Emphysema, unspecified: Secondary | ICD-10-CM | POA: Diagnosis not present

## 2018-07-17 DIAGNOSIS — I5031 Acute diastolic (congestive) heart failure: Secondary | ICD-10-CM

## 2018-07-17 DIAGNOSIS — I739 Peripheral vascular disease, unspecified: Secondary | ICD-10-CM

## 2018-07-17 DIAGNOSIS — R7302 Impaired glucose tolerance (oral): Secondary | ICD-10-CM

## 2018-07-17 DIAGNOSIS — R32 Unspecified urinary incontinence: Secondary | ICD-10-CM

## 2018-07-17 DIAGNOSIS — Z86010 Personal history of colonic polyps: Secondary | ICD-10-CM

## 2018-07-17 DIAGNOSIS — F649 Gender identity disorder, unspecified: Secondary | ICD-10-CM

## 2018-07-17 MED ORDER — OXYCODONE ER 9 MG PO C12A: 9.0000 mg | EXTENDED_RELEASE_CAPSULE | Freq: Two times a day (BID) | ORAL | 0 refills | Status: DC

## 2018-07-17 NOTE — Assessment & Plan Note (Signed)
stable overall by history and exam, recent data reviewed with pt, and pt to continue medical treatment as before,  to f/u any worsening symptoms or concerns  

## 2018-07-17 NOTE — Progress Notes (Signed)
Subjective:    Patient ID: Alexis Higgins, female    DOB: 03-23-1941, 78 y.o.   MRN: [id number]  HPI  Here after most recent hospn 2/14 - 2/19 for uncontrolled pain; has been hospd x 5 since oct 2019, now wheelchair bound, can no longer stand, has hoyer lift at home no recent falls.  Has PT at home and husband just wants her to be able to assist with standing up, as this is the most difficult aspect of her home life, and depends on him for ADLs.  Had fire dept to put her in the car today to get here.  Current pain med working well, husband asks for refill.  Pt denies chest pain, increased sob or doe, wheezing, orthopnea, PND, increased LE swelling, palpitations, dizziness or syncope, except for the recent left sided CP improving.  Pt denies new neurological symptoms such as new headache, or facial or extremity weakness or numbness   Pt denies polydipsia, polyuria Past Medical History:  Diagnosis Date  . ANXIETY 01/01/2007  . Blood transfusion without reported diagnosis 2015  . BURSITIS, RIGHT HIP 06/04/2009  . CAD (coronary artery disease)    a. BMS to LAD 2010. b. NSTEMI with DES to LAD for ISR 2011. c. Patent stent 03/2012/Imdur added.  . Cataract   . Colon polyps    H/o tubular adenoma of colon  . COPD 01/01/2007   a. Chronic resp failure on home O2.  Marland Kitchen DEPRESSION 01/01/2007  . Eczema 01/08/2011  . GROIN PAIN 06/20/2008  . Headache(784.0) 01/01/2007  . Hemorrhoid   . HYPERTENSION 01/01/2007  . Impaired glucose tolerance 01/07/2011  . LOW BACK PAIN 01/01/2007  . Muscle weakness (generalized) 06/04/2009  . On home oxygen therapy    "2L; 24/7" (03/21/2018)  . OSTEOARTHRITIS, HIP 07/01/2008  . OSTEOPOROSIS 01/01/2007  . Pneumonia 1998  . Rosacea 01/08/2011  . SYNCOPE 01/01/2007  . Thoracic compression fracture ([place])   . TRANSIENT ISCHEMIC ATTACK, HX OF 01/01/2007   Past Surgical History:  Procedure Laterality Date  . ABDOMINAL HYSTERECTOMY    . APPENDECTOMY    . BACK SURGERY    . BREAST  ENHANCEMENT SURGERY    . COLONOSCOPY  01/25/2002   tubular adenoma,hemorrhoids, hyperplastic  colon polyps  . COLONOSCOPY  02/17/2005   hemorrhoids  . CORONARY ANGIOPLASTY    . CORONARY STENT PLACEMENT    . ESOPHAGOGASTRODUODENOSCOPY (EGD) WITH PROPOFOL N/A 08/10/2017   Procedure: ESOPHAGOGASTRODUODENOSCOPY (EGD) WITH PROPOFOL;  Surgeon: Gatha Mayer, MD;  Location: [facility];  Service: Endoscopy;  Laterality: N/A;  . HIP ARTHROPLASTY Left 10/01/2013   Procedure: ARTHROPLASTY UNIPOLAR   HIP;  Surgeon: Marianna Payment, MD;  Location: [place];  Service: Orthopedics;  Laterality: Left;  . HIP SURGERY Left    DR XU     PROXIMAL NECK   . IR GENERIC HISTORICAL  02/19/2016   IR VERTEBROPLASTY CERV/THOR BX INC UNI/BIL INC/INJECT/IMAGING 02/19/2016 Luanne Bras, MD [facility]  . IR GENERIC HISTORICAL  07/15/2016   IR KYPHO THORACIC WITH BONE BIOPSY 07/15/2016 Luanne Bras, MD [facility]  . IR KYPHO EA ADDL LEVEL THORACIC OR LUMBAR  09/14/2016  . IR RADIOLOGIST EVAL & MGMT  09/22/2016  . IR VERTEBROPLASTY CERV/THOR BX INC UNI/BIL INC/INJECT/IMAGING  10/08/2016  . IR VERTEBROPLASTY CERV/THOR BX INC UNI/BIL INC/INJECT/IMAGING  11/16/2016  . KYPHOPLASTY  12/2015-10/2016 X 5   "?2lumbar & 3 thoracic"  . LEFT HEART CATHETERIZATION WITH CORONARY ANGIOGRAM N/A 04/13/2012   Procedure: LEFT HEART  CATHETERIZATION WITH CORONARY ANGIOGRAM;  Surgeon: Burnell Blanks, MD;  Location: [facility] CATH LAB;  Service: Cardiovascular;  Laterality: N/A;  . OOPHORECTOMY     one ovary  . s/p bilat cataract  2010  . sp lumbar disc surgury     [name]    reports that she quit smoking about 12 years ago. Her smoking use included cigarettes. She has a 76.50 pack-year smoking history. She has never used smokeless tobacco. She reports that she does not drink alcohol or use drugs. family history includes Cardiomyopathy in her sister; Emphysema in her sister; Heart attack in her sister; Heart disease in her  sister; Osteoporosis in her mother; Seizures in her sister. Allergies  Allergen Reactions  . Aspirin Other (See Comments)     cns bleed risk  . Codeine Anaphylaxis and Rash  . Stiolto Respimat [Tiotropium Bromide-Olodaterol] Other (See Comments)    Caused inability to breath/ severe reaction - reported in 2015   Current Outpatient Medications on File Prior to Visit  Medication Sig Dispense Refill  . acetaminophen (TYLENOL) 500 MG tablet Take 1,000 mg by mouth every 6 (six) hours.     . calcitonin, salmon, (MIACALCIN/FORTICAL) 200 UNIT/ACT nasal spray Place 1 spray into alternate nostrils daily.    . Cholecalciferol (VITAMIN D) 1000 UNITS capsule Take 1,000 Units by mouth every evening.     . clopidogrel (PLAVIX) 75 MG tablet TAKE 1 TABLET BY MOUTH EVERY DAY (Patient taking differently: Take 75 mg by mouth daily. ) 90 tablet 2  . diclofenac sodium (VOLTAREN) 1 % GEL APPLY 4 GRAMS TOPICALLY 4 TIMES DAILY (Patient taking differently: Apply 1 g topically See admin instructions. For chronic back pain - apply with lidocaine patch to lower back twice daily) 100 g 0  . docusate sodium (COLACE) 100 MG capsule Take 100 mg by mouth at bedtime as needed for mild constipation.     Marland Kitchen escitalopram (LEXAPRO) 5 MG tablet Take 5 mg by mouth daily.    . furosemide (LASIX) 20 MG tablet Take 1 tablet (20 mg total) by mouth 2 (two) times daily as needed. (Patient taking differently: Take 20 mg by mouth 2 (two) times daily as needed. fluid) 180 tablet 3  . hydrOXYzine (ATARAX/VISTARIL) 25 MG tablet TAKE 1 OR 2 TABLETS BY MOUTH EVERY 8 HOURS AS NEEDED FOR ANXIETY (Patient taking differently: Take 25 mg by mouth 2 (two) times daily. For itching) 540 tablet 2  . isosorbide mononitrate (IMDUR) 30 MG 24 hr tablet TAKE 0.5 TABLETS (15 MG TOTAL) BY MOUTH DAILY. (Patient taking differently: Take 15 mg by mouth daily. ) 45 tablet 3  . lidocaine (LIDODERM) 5 % Place 1 patch onto the skin daily. Remove & Discard patch within 12  hours or as directed by MD 30 patch 0  . lovastatin (MEVACOR) 20 MG tablet TAKE 20 MG BY MOUTH AT BEDTIME (Patient taking differently: Take 20 mg by mouth at bedtime. ) 90 tablet 1  . metoprolol tartrate (LOPRESSOR) 25 MG tablet TAKE 1 TABLET BY MOUTH TWICE A DAY NEEDS APPT (Patient taking differently: Take 25 mg by mouth 2 (two) times daily. ) 180 tablet 3  . nitroGLYCERIN (NITROSTAT) 0.4 MG SL tablet Place 1 tablet (0.4 mg total) under the tongue every 5 (five) minutes as needed for chest pain (up to 3 doses). 25 tablet 4  . ondansetron (ZOFRAN) 4 MG tablet Take 1 tablet (4 mg total) by mouth every 8 (eight) hours as needed for nausea or vomiting. (  Patient taking differently: Take 4 mg by mouth 2 (two) times daily. ) 11 tablet 0  . OXYGEN Inhale 2 L into the lungs continuous.     . polyethylene glycol (MIRALAX / GLYCOLAX) packet Take 17 g by mouth daily as needed for mild constipation.     . polyvinyl alcohol (LIQUIFILM TEARS) 1.4 % ophthalmic solution Place 1 drop into both eyes daily as needed for dry eyes.     . potassium chloride (K-DUR) 10 MEQ tablet Take 1 tablet (10 mEq total) by mouth daily. 90 tablet 3  . predniSONE (DELTASONE) 10 MG tablet TAKE 1 & 1/2 TABLET(S) BY MOUTH DAILY WITH BREAKFAST (Patient taking differently: Take 15 mg by mouth daily with breakfast. ) 45 tablet 5  . PROAIR HFA 108 (90 Base) MCG/ACT inhaler INHALE 2 PUFFS INTO THE LUNGS EVERY 6 (SIX) HOURS AS NEEDED FOR WHEEZING OR SHORTNESS OF BREATH. 18 Inhaler 4  . tiotropium (SPIRIVA HANDIHALER) 18 MCG inhalation capsule Place 1 capsule (18 mcg total) into inhaler and inhale daily at 6 (six) AM. (Patient taking differently: Place 18 mcg into inhaler and inhale daily. ) 90 capsule 3  . traMADol (ULTRAM) 50 MG tablet Take 2 tablets (100 mg total) by mouth 2 (two) times daily. 12 tablet 0  . traZODone (DESYREL) 50 MG tablet Take 50 mg by mouth at bedtime.    . vitamin B-12 (CYANOCOBALAMIN) 1000 MCG tablet Take 1,000 mcg by mouth  every evening.     . vitamin C (ASCORBIC ACID) 500 MG tablet Take 500 mg by mouth every evening.      No current facility-administered medications on file prior to visit.    Review of Systems  Constitutional: Negative for other unusual diaphoresis or sweats HENT: Negative for ear discharge or swelling Eyes: Negative for other worsening visual disturbances Respiratory: Negative for stridor or other swelling  Gastrointestinal: Negative for worsening distension or other blood Genitourinary: Negative for retention or other urinary change Musculoskeletal: Negative for other MSK pain or swelling Skin: Negative for color change or other new lesions Neurological: Negative for worsening tremors and other numbness  Psychiatric/Behavioral: Negative for worsening agitation or other fatigue All other system neg per pt    Objective:   Physical Exam BP (!) 152/86   Pulse (!) 53   Temp 98.2 F (36.8 C) (Oral)   Ht 5\' 7"  (1.702 m)   SpO2 91%   BMI 17.85 kg/m  VS noted, on home o2 in wheelchair Constitutional: Pt appears in NAD HENT: Head: NCAT.  Right Ear: External ear normal.  Left Ear: External ear normal.  Eyes: . Pupils are equal, round, and reactive to light. Conjunctivae and EOM are normal Nose: without d/c or deformity Neck: Neck supple. Gross normal ROM Cardiovascular: Normal rate and regular rhythm.   Pulmonary/Chest: Effort normal and breath sounds decreased without rales or wheezing.  Abd:  Soft, NT, ND, + BS, no organomegaly Neurological: Pt is alert. At baseline orientation, motor grossly intact with general weakness Skin: Skin is warm. No rashes, other new lesions, no LE edema Psychiatric: Pt behavior is normal without agitation  No other exam findings Lab Results  Component Value Date   WBC 9.1 07/12/2018   HGB 10.5 (L) 07/12/2018   HCT 34.3 (L) 07/12/2018   PLT 249 07/12/2018   GLUCOSE 103 (H) 07/12/2018   CHOL 164 02/10/2018   TRIG 173.0 (H) 02/10/2018   HDL 87.70  02/10/2018   LDLDIRECT 49.0 02/05/2016   LDLCALC 42 02/10/2018  ALT 15 07/07/2018   AST 18 07/07/2018   NA 138 07/12/2018   K 4.4 07/12/2018   CL 96 (L) 07/12/2018   CREATININE 0.82 07/12/2018   BUN 17 07/12/2018   CO2 34 (H) 07/12/2018   TSH 2.54 02/10/2018   INR 0.87 05/28/2018   HGBA1C 5.7 02/10/2018       Assessment & Plan:

## 2018-07-17 NOTE — Patient Instructions (Signed)
Please continue all other medications as before, and refills have been done if requested - the pain medication  Please have the pharmacy call with any other refills you may need.  Please keep your appointments with your specialists as you may have planned  Please continue your PT at home to work on standing  No further labs needed today

## 2018-07-17 NOTE — Assessment & Plan Note (Signed)
[place] for refill narcotic, for bowel regimen

## 2018-07-17 NOTE — Assessment & Plan Note (Signed)
To continue home PT 

## 2018-07-17 NOTE — Telephone Encounter (Signed)
Copied from [place] [id number]. Topic: Quick Communication - See Telephone Encounter >> Jul 17, 2018  9:14 AM Bea Graff, NT wrote: CRM for notification. See Telephone encounter for: 07/17/18. Danae Chen, OT with [facility] calling to let the office know that the family would like to hold OT at this time and continue with just PT until the pt gets her strength back. CB: [phone]

## 2018-07-17 NOTE — Telephone Encounter (Signed)
Noted  

## 2018-07-17 NOTE — Assessment & Plan Note (Signed)
Mild increased today, declines change in tx today, cont to monitor at home

## 2018-07-18 ENCOUNTER — Other Ambulatory Visit: Payer: Self-pay

## 2018-07-18 ENCOUNTER — Telehealth: Payer: Self-pay | Admitting: Internal Medicine

## 2018-07-18 DIAGNOSIS — M80051D Age-related osteoporosis with current pathological fracture, right femur, subsequent encounter for fracture with routine healing: Secondary | ICD-10-CM | POA: Diagnosis not present

## 2018-07-18 DIAGNOSIS — M8008XD Age-related osteoporosis with current pathological fracture, vertebra(e), subsequent encounter for fracture with routine healing: Secondary | ICD-10-CM | POA: Diagnosis not present

## 2018-07-18 DIAGNOSIS — J439 Emphysema, unspecified: Secondary | ICD-10-CM | POA: Diagnosis not present

## 2018-07-18 DIAGNOSIS — J9621 Acute and chronic respiratory failure with hypoxia: Secondary | ICD-10-CM | POA: Diagnosis not present

## 2018-07-18 DIAGNOSIS — I110 Hypertensive heart disease with heart failure: Secondary | ICD-10-CM | POA: Diagnosis not present

## 2018-07-18 NOTE — Telephone Encounter (Signed)
Copied from [place] [id number]. Topic: Quick Communication - See Telephone Encounter >> Jul 18, 2018  4:12 PM Vernona Rieger wrote: CRM for notification. See Telephone encounter for: 07/18/18.  Claire Shown, social worker from [facility] called to let Dr Jenny Reichmann know that her husband declined her services at this time.  Call back @ [phone]

## 2018-07-18 NOTE — Patient Outreach (Signed)
[facility]) Care Management  07/18/2018  SHARAINE DELANGE [birth year] [id number]   Referral Date: 07/18/2018 Referral Source: MD referral Referral Reason: TIA/stroke-Assessment of needs  Return call to spouse.  He is able to verify HIPAA. He states that he knows that Dr. Jenny Reichmann made the referral but if there is nothing that [facility] could do that was free he did not think that we could help him.  Discussed with him [facility] services and that it was free.  However, any resources for personal care provided would cost, he declined the services and how we could help and support.  He states that [facility] is involved with nurse and social work.  He states that PT is also supposed to be involved.  He states that the nurse from [facility] is supposed to be coming this afternoon.  Husband states he is looking for some hands on help that is free as he cannot afford to pay someone.   He states that he uses the hoyer lift to get patient up as he can no longer lift her but she does not like it.  He expressed some frustration that they make just over the limit income wise to qualify for help but cannot afford to pay anyone.  Reiterated how [facility] could assist to keep patient in the home but he declined again.  Advised him the CM would send letter and brochure if he wanted to review and call back.  He verbalized understanding.   Plan: RN CM will close case.  Jone Baseman, RN, MSN [facility] Care Management Coordinator Direct Line [phone] Cell [phone] Toll Free: [phone]  Fax: [phone]

## 2018-07-18 NOTE — Telephone Encounter (Signed)
Noted  

## 2018-07-18 NOTE — Patient Outreach (Signed)
[facility]) Care Management  07/18/2018  Alexis Higgins 23-Feb-1941 [id number]   Referral Date: 07/18/2018 Referral Source: MD referral Referral Reason: TIA/stroke-Assessment of needs   Outreach Attempt: No answer.  HIPAA compliant voice message left.     Plan: RN CM will attempt again within 4 business days and send letter.    Jone Baseman, RN, MSN [facility] Care Management Care Management Coordinator Direct Line [phone] Toll Free: [phone]  Fax: [phone]

## 2018-07-20 ENCOUNTER — Ambulatory Visit: Payer: Medicare Other

## 2018-07-21 DIAGNOSIS — M80051D Age-related osteoporosis with current pathological fracture, right femur, subsequent encounter for fracture with routine healing: Secondary | ICD-10-CM | POA: Diagnosis not present

## 2018-07-21 DIAGNOSIS — M8008XD Age-related osteoporosis with current pathological fracture, vertebra(e), subsequent encounter for fracture with routine healing: Secondary | ICD-10-CM | POA: Diagnosis not present

## 2018-07-21 DIAGNOSIS — J9621 Acute and chronic respiratory failure with hypoxia: Secondary | ICD-10-CM | POA: Diagnosis not present

## 2018-07-21 DIAGNOSIS — J439 Emphysema, unspecified: Secondary | ICD-10-CM | POA: Diagnosis not present

## 2018-07-21 DIAGNOSIS — I110 Hypertensive heart disease with heart failure: Secondary | ICD-10-CM | POA: Diagnosis not present

## 2018-07-24 DIAGNOSIS — M8008XD Age-related osteoporosis with current pathological fracture, vertebra(e), subsequent encounter for fracture with routine healing: Secondary | ICD-10-CM | POA: Diagnosis not present

## 2018-07-24 DIAGNOSIS — J9621 Acute and chronic respiratory failure with hypoxia: Secondary | ICD-10-CM | POA: Diagnosis not present

## 2018-07-24 DIAGNOSIS — S41102A Unspecified open wound of left upper arm, initial encounter: Secondary | ICD-10-CM | POA: Diagnosis not present

## 2018-07-24 DIAGNOSIS — J439 Emphysema, unspecified: Secondary | ICD-10-CM | POA: Diagnosis not present

## 2018-07-24 DIAGNOSIS — M80051D Age-related osteoporosis with current pathological fracture, right femur, subsequent encounter for fracture with routine healing: Secondary | ICD-10-CM | POA: Diagnosis not present

## 2018-07-24 DIAGNOSIS — S51801A Unspecified open wound of right forearm, initial encounter: Secondary | ICD-10-CM | POA: Diagnosis not present

## 2018-07-24 DIAGNOSIS — I110 Hypertensive heart disease with heart failure: Secondary | ICD-10-CM | POA: Diagnosis not present

## 2018-07-27 DIAGNOSIS — J9621 Acute and chronic respiratory failure with hypoxia: Secondary | ICD-10-CM | POA: Diagnosis not present

## 2018-07-27 DIAGNOSIS — M8008XD Age-related osteoporosis with current pathological fracture, vertebra(e), subsequent encounter for fracture with routine healing: Secondary | ICD-10-CM | POA: Diagnosis not present

## 2018-07-27 DIAGNOSIS — J439 Emphysema, unspecified: Secondary | ICD-10-CM | POA: Diagnosis not present

## 2018-07-27 DIAGNOSIS — M80051D Age-related osteoporosis with current pathological fracture, right femur, subsequent encounter for fracture with routine healing: Secondary | ICD-10-CM | POA: Diagnosis not present

## 2018-07-27 DIAGNOSIS — I110 Hypertensive heart disease with heart failure: Secondary | ICD-10-CM | POA: Diagnosis not present

## 2018-07-31 ENCOUNTER — Telehealth: Payer: Self-pay | Admitting: Internal Medicine

## 2018-07-31 NOTE — Telephone Encounter (Signed)
Noted.  Dr. John FYI 

## 2018-07-31 NOTE — Telephone Encounter (Signed)
Copied from [place] [id number]. Topic: Quick Communication - See Telephone Encounter >> Jul 31, 2018  2:24 PM Sheran Luz wrote: CRM for notification. See Telephone encounter for: 07/31/18.  Claiborne Billings, with [facility], states that patient and husband would like to discontinue home health services at this time. Claiborne Billings states that she was advised by patient that she is able to manage herself. Claiborne Billings states that she can be contacted with any additional questions/concerns.   Claiborne Billings cb# [phone]

## 2018-08-02 ENCOUNTER — Encounter: Payer: Medicare Other | Admitting: Internal Medicine

## 2018-08-10 DIAGNOSIS — R092 Respiratory arrest: Secondary | ICD-10-CM | POA: Diagnosis not present

## 2018-08-10 DIAGNOSIS — R0689 Other abnormalities of breathing: Secondary | ICD-10-CM | POA: Diagnosis not present

## 2018-08-10 DIAGNOSIS — I2510 Atherosclerotic heart disease of native coronary artery without angina pectoris: Secondary | ICD-10-CM | POA: Diagnosis not present

## 2018-08-10 DIAGNOSIS — J449 Chronic obstructive pulmonary disease, unspecified: Secondary | ICD-10-CM | POA: Diagnosis not present

## 2018-08-12 DIAGNOSIS — R0689 Other abnormalities of breathing: Secondary | ICD-10-CM | POA: Diagnosis not present

## 2018-08-12 DIAGNOSIS — J449 Chronic obstructive pulmonary disease, unspecified: Secondary | ICD-10-CM | POA: Diagnosis not present

## 2018-08-12 DIAGNOSIS — R109 Unspecified abdominal pain: Secondary | ICD-10-CM | POA: Diagnosis not present

## 2018-08-12 DIAGNOSIS — R0902 Hypoxemia: Secondary | ICD-10-CM | POA: Diagnosis not present

## 2018-08-14 ENCOUNTER — Encounter: Payer: Self-pay | Admitting: Internal Medicine

## 2018-08-14 ENCOUNTER — Other Ambulatory Visit: Payer: Self-pay | Admitting: Internal Medicine

## 2018-08-14 DIAGNOSIS — R0689 Other abnormalities of breathing: Secondary | ICD-10-CM | POA: Diagnosis not present

## 2018-08-14 DIAGNOSIS — J9610 Chronic respiratory failure, unspecified whether with hypoxia or hypercapnia: Secondary | ICD-10-CM | POA: Diagnosis not present

## 2018-08-14 DIAGNOSIS — R092 Respiratory arrest: Secondary | ICD-10-CM | POA: Diagnosis not present

## 2018-08-14 DIAGNOSIS — J449 Chronic obstructive pulmonary disease, unspecified: Secondary | ICD-10-CM | POA: Diagnosis not present

## 2018-08-14 NOTE — Telephone Encounter (Signed)
Done erx 

## 2018-08-15 ENCOUNTER — Emergency Department (HOSPITAL_COMMUNITY): Payer: Medicare Other

## 2018-08-15 ENCOUNTER — Inpatient Hospital Stay (HOSPITAL_COMMUNITY)
Admission: EM | Admit: 2018-08-15 | Discharge: 2018-08-24 | DRG: 190 | Disposition: A | Discharge status: Hospice | Payer: Medicare Other | Attending: Internal Medicine | Admitting: Internal Medicine

## 2018-08-15 ENCOUNTER — Other Ambulatory Visit: Payer: Self-pay

## 2018-08-15 ENCOUNTER — Encounter (HOSPITAL_COMMUNITY): Payer: Self-pay | Admitting: Emergency Medicine

## 2018-08-15 DIAGNOSIS — J069 Acute upper respiratory infection, unspecified: Secondary | ICD-10-CM | POA: Diagnosis not present

## 2018-08-15 DIAGNOSIS — E876 Hypokalemia: Secondary | ICD-10-CM | POA: Diagnosis not present

## 2018-08-15 DIAGNOSIS — Z885 Allergy status to narcotic agent status: Secondary | ICD-10-CM

## 2018-08-15 DIAGNOSIS — Z9981 Dependence on supplemental oxygen: Secondary | ICD-10-CM

## 2018-08-15 DIAGNOSIS — Z7902 Long term (current) use of antithrombotics/antiplatelets: Secondary | ICD-10-CM

## 2018-08-15 DIAGNOSIS — M545 Low back pain: Secondary | ICD-10-CM | POA: Diagnosis not present

## 2018-08-15 DIAGNOSIS — Z515 Encounter for palliative care: Secondary | ICD-10-CM | POA: Diagnosis not present

## 2018-08-15 DIAGNOSIS — J189 Pneumonia, unspecified organism: Secondary | ICD-10-CM | POA: Diagnosis present

## 2018-08-15 DIAGNOSIS — F419 Anxiety disorder, unspecified: Secondary | ICD-10-CM | POA: Diagnosis present

## 2018-08-15 DIAGNOSIS — J9811 Atelectasis: Secondary | ICD-10-CM | POA: Diagnosis present

## 2018-08-15 DIAGNOSIS — M4854XA Collapsed vertebra, not elsewhere classified, thoracic region, initial encounter for fracture: Secondary | ICD-10-CM | POA: Diagnosis not present

## 2018-08-15 DIAGNOSIS — Z886 Allergy status to analgesic agent status: Secondary | ICD-10-CM

## 2018-08-15 DIAGNOSIS — S22000A Wedge compression fracture of unspecified thoracic vertebra, initial encounter for closed fracture: Secondary | ICD-10-CM | POA: Diagnosis present

## 2018-08-15 DIAGNOSIS — J440 Chronic obstructive pulmonary disease with acute lower respiratory infection: Secondary | ICD-10-CM | POA: Diagnosis not present

## 2018-08-15 DIAGNOSIS — I890 Lymphedema, not elsewhere classified: Secondary | ICD-10-CM | POA: Diagnosis present

## 2018-08-15 DIAGNOSIS — R627 Adult failure to thrive: Secondary | ICD-10-CM | POA: Diagnosis present

## 2018-08-15 DIAGNOSIS — F22 Delusional disorders: Secondary | ICD-10-CM | POA: Diagnosis present

## 2018-08-15 DIAGNOSIS — Z79891 Long term (current) use of opiate analgesic: Secondary | ICD-10-CM

## 2018-08-15 DIAGNOSIS — L89152 Pressure ulcer of sacral region, stage 2: Secondary | ICD-10-CM | POA: Diagnosis present

## 2018-08-15 DIAGNOSIS — J9601 Acute respiratory failure with hypoxia: Secondary | ICD-10-CM | POA: Diagnosis not present

## 2018-08-15 DIAGNOSIS — J9621 Acute and chronic respiratory failure with hypoxia: Secondary | ICD-10-CM | POA: Diagnosis present

## 2018-08-15 DIAGNOSIS — Z87891 Personal history of nicotine dependence: Secondary | ICD-10-CM

## 2018-08-15 DIAGNOSIS — E860 Dehydration: Secondary | ICD-10-CM | POA: Diagnosis not present

## 2018-08-15 DIAGNOSIS — I110 Hypertensive heart disease with heart failure: Secondary | ICD-10-CM | POA: Diagnosis not present

## 2018-08-15 DIAGNOSIS — Z8701 Personal history of pneumonia (recurrent): Secondary | ICD-10-CM

## 2018-08-15 DIAGNOSIS — Z79899 Other long term (current) drug therapy: Secondary | ICD-10-CM

## 2018-08-15 DIAGNOSIS — S20229A Contusion of unspecified back wall of thorax, initial encounter: Secondary | ICD-10-CM | POA: Diagnosis not present

## 2018-08-15 DIAGNOSIS — G8921 Chronic pain due to trauma: Secondary | ICD-10-CM | POA: Diagnosis not present

## 2018-08-15 DIAGNOSIS — Z7189 Other specified counseling: Secondary | ICD-10-CM | POA: Diagnosis not present

## 2018-08-15 DIAGNOSIS — F319 Bipolar disorder, unspecified: Secondary | ICD-10-CM | POA: Diagnosis present

## 2018-08-15 DIAGNOSIS — N179 Acute kidney failure, unspecified: Secondary | ICD-10-CM | POA: Diagnosis not present

## 2018-08-15 DIAGNOSIS — G8929 Other chronic pain: Secondary | ICD-10-CM | POA: Diagnosis present

## 2018-08-15 DIAGNOSIS — R0902 Hypoxemia: Secondary | ICD-10-CM | POA: Diagnosis not present

## 2018-08-15 DIAGNOSIS — R54 Age-related physical debility: Secondary | ICD-10-CM | POA: Diagnosis present

## 2018-08-15 DIAGNOSIS — Z6821 Body mass index (BMI) 21.0-21.9, adult: Secondary | ICD-10-CM

## 2018-08-15 DIAGNOSIS — Z96642 Presence of left artificial hip joint: Secondary | ICD-10-CM | POA: Diagnosis present

## 2018-08-15 DIAGNOSIS — M546 Pain in thoracic spine: Secondary | ICD-10-CM | POA: Diagnosis not present

## 2018-08-15 DIAGNOSIS — S0990XA Unspecified injury of head, initial encounter: Secondary | ICD-10-CM | POA: Diagnosis not present

## 2018-08-15 DIAGNOSIS — M810 Age-related osteoporosis without current pathological fracture: Secondary | ICD-10-CM | POA: Diagnosis present

## 2018-08-15 DIAGNOSIS — Z955 Presence of coronary angioplasty implant and graft: Secondary | ICD-10-CM

## 2018-08-15 DIAGNOSIS — R5381 Other malaise: Secondary | ICD-10-CM | POA: Diagnosis present

## 2018-08-15 DIAGNOSIS — Z8262 Family history of osteoporosis: Secondary | ICD-10-CM

## 2018-08-15 DIAGNOSIS — Z90710 Acquired absence of both cervix and uterus: Secondary | ICD-10-CM

## 2018-08-15 DIAGNOSIS — I252 Old myocardial infarction: Secondary | ICD-10-CM

## 2018-08-15 DIAGNOSIS — S22000D Wedge compression fracture of unspecified thoracic vertebra, subsequent encounter for fracture with routine healing: Secondary | ICD-10-CM | POA: Diagnosis not present

## 2018-08-15 DIAGNOSIS — R9082 White matter disease, unspecified: Secondary | ICD-10-CM | POA: Diagnosis present

## 2018-08-15 DIAGNOSIS — I700 Atherosclerosis of aorta: Secondary | ICD-10-CM | POA: Diagnosis not present

## 2018-08-15 DIAGNOSIS — T07XXXA Unspecified multiple injuries, initial encounter: Secondary | ICD-10-CM

## 2018-08-15 DIAGNOSIS — J449 Chronic obstructive pulmonary disease, unspecified: Secondary | ICD-10-CM | POA: Diagnosis present

## 2018-08-15 DIAGNOSIS — R1084 Generalized abdominal pain: Secondary | ICD-10-CM | POA: Diagnosis not present

## 2018-08-15 DIAGNOSIS — R64 Cachexia: Secondary | ICD-10-CM | POA: Diagnosis present

## 2018-08-15 DIAGNOSIS — Z888 Allergy status to other drugs, medicaments and biological substances status: Secondary | ICD-10-CM

## 2018-08-15 DIAGNOSIS — I10 Essential (primary) hypertension: Secondary | ICD-10-CM | POA: Diagnosis present

## 2018-08-15 DIAGNOSIS — J439 Emphysema, unspecified: Secondary | ICD-10-CM | POA: Diagnosis not present

## 2018-08-15 DIAGNOSIS — I2510 Atherosclerotic heart disease of native coronary artery without angina pectoris: Secondary | ICD-10-CM | POA: Diagnosis not present

## 2018-08-15 DIAGNOSIS — Z66 Do not resuscitate: Secondary | ICD-10-CM | POA: Diagnosis not present

## 2018-08-15 DIAGNOSIS — Z8781 Personal history of (healed) traumatic fracture: Secondary | ICD-10-CM

## 2018-08-15 DIAGNOSIS — T17908A Unspecified foreign body in respiratory tract, part unspecified causing other injury, initial encounter: Secondary | ICD-10-CM | POA: Diagnosis present

## 2018-08-15 DIAGNOSIS — W19XXXA Unspecified fall, initial encounter: Secondary | ICD-10-CM | POA: Diagnosis not present

## 2018-08-15 DIAGNOSIS — E43 Unspecified severe protein-calorie malnutrition: Secondary | ICD-10-CM | POA: Diagnosis not present

## 2018-08-15 DIAGNOSIS — Z8673 Personal history of transient ischemic attack (TIA), and cerebral infarction without residual deficits: Secondary | ICD-10-CM

## 2018-08-15 DIAGNOSIS — R0602 Shortness of breath: Secondary | ICD-10-CM

## 2018-08-15 DIAGNOSIS — Z86010 Personal history of colonic polyps: Secondary | ICD-10-CM

## 2018-08-15 DIAGNOSIS — S40029A Contusion of unspecified upper arm, initial encounter: Secondary | ICD-10-CM | POA: Diagnosis not present

## 2018-08-15 DIAGNOSIS — L8990 Pressure ulcer of unspecified site, unspecified stage: Secondary | ICD-10-CM

## 2018-08-15 DIAGNOSIS — S8010XA Contusion of unspecified lower leg, initial encounter: Secondary | ICD-10-CM | POA: Diagnosis not present

## 2018-08-15 DIAGNOSIS — I5032 Chronic diastolic (congestive) heart failure: Secondary | ICD-10-CM | POA: Diagnosis present

## 2018-08-15 DIAGNOSIS — I739 Peripheral vascular disease, unspecified: Secondary | ICD-10-CM | POA: Diagnosis present

## 2018-08-15 DIAGNOSIS — M5489 Other dorsalgia: Secondary | ICD-10-CM | POA: Diagnosis not present

## 2018-08-15 DIAGNOSIS — Z23 Encounter for immunization: Secondary | ICD-10-CM | POA: Diagnosis not present

## 2018-08-15 DIAGNOSIS — Z825 Family history of asthma and other chronic lower respiratory diseases: Secondary | ICD-10-CM

## 2018-08-15 DIAGNOSIS — Z8249 Family history of ischemic heart disease and other diseases of the circulatory system: Secondary | ICD-10-CM

## 2018-08-15 DIAGNOSIS — G894 Chronic pain syndrome: Secondary | ICD-10-CM | POA: Diagnosis not present

## 2018-08-15 DIAGNOSIS — Z7952 Long term (current) use of systemic steroids: Secondary | ICD-10-CM

## 2018-08-15 LAB — BASIC METABOLIC PANEL
Anion gap: 6 (ref 5–15)
BUN: 17 mg/dL (ref 8–23)
CO2: 31 mmol/L (ref 22–32)
Calcium: 8.5 mg/dL — ABNORMAL LOW (ref 8.9–10.3)
Chloride: 104 mmol/L (ref 98–111)
Creatinine, Ser: 0.94 mg/dL (ref 0.44–1.00)
GFR calc Af Amer: >60 mL/min (ref >60)
GFR calc non Af Amer: 59 mL/min — ABNORMAL LOW (ref >60)
Glucose, Bld: 77 mg/dL (ref 70–99)
POTASSIUM: 3.8 mmol/L (ref 3.5–5.1)
Sodium: 141 mmol/L (ref 135–145)

## 2018-08-15 LAB — URINALYSIS, ROUTINE W REFLEX MICROSCOPIC
Bilirubin Urine: NEGATIVE
Glucose, UA: NEGATIVE mg/dL
Hgb urine dipstick: NEGATIVE
Ketones, ur: 20 mg/dL — AB
Leukocytes,Ua: NEGATIVE
Nitrite: NEGATIVE
Protein, ur: NEGATIVE mg/dL
Specific Gravity, Urine: 1.021 (ref 1.005–1.030)
pH: 5 (ref 5.0–8.0)

## 2018-08-15 LAB — CBC WITH DIFFERENTIAL/PLATELET
Abs Immature Granulocytes: 0.47 10*3/uL — ABNORMAL HIGH (ref 0.00–0.07)
Basophils Absolute: 0 10*3/uL (ref 0.0–0.1)
Basophils Relative: 0 %
Eosinophils Absolute: 0 10*3/uL (ref 0.0–0.5)
Eosinophils Relative: 0 %
HEMATOCRIT: 38.8 % (ref 36.0–46.0)
Hemoglobin: 11.6 g/dL — ABNORMAL LOW (ref 12.0–15.0)
Immature Granulocytes: 4 %
Lymphocytes Relative: 7 %
Lymphs Abs: 0.9 10*3/uL (ref 0.7–4.0)
MCH: 28.2 pg (ref 26.0–34.0)
MCHC: 29.9 g/dL — ABNORMAL LOW (ref 30.0–36.0)
MCV: 94.2 fL (ref 80.0–100.0)
Monocytes Absolute: 1.1 10*3/uL — ABNORMAL HIGH (ref 0.1–1.0)
Monocytes Relative: 9 %
NEUTROS ABS: 10 10*3/uL — AB (ref 1.7–7.7)
Neutrophils Relative %: 80 %
Platelets: 209 10*3/uL (ref 150–400)
RBC: 4.12 MIL/uL (ref 3.87–5.11)
RDW: 15.2 % (ref 11.5–15.5)
WBC: 12.5 10*3/uL — ABNORMAL HIGH (ref 4.0–10.5)
nRBC: 0 % (ref 0.0–0.2)

## 2018-08-15 MED ORDER — TETANUS-DIPHTH-ACELL PERTUSSIS 5-2.5-18.5 LF-MCG/0.5 IM SUSP
0.5000 mL | Freq: Once | INTRAMUSCULAR | Status: AC
  Administered 2018-08-15: 0.5 mL via INTRAMUSCULAR
  Filled 2018-08-15: qty 0.5

## 2018-08-15 MED ORDER — SODIUM CHLORIDE 0.9 % IV BOLUS (SEPSIS)
250.0000 mL | Freq: Once | INTRAVENOUS | Status: AC
  Administered 2018-08-15: 250 mL via INTRAVENOUS

## 2018-08-15 MED ORDER — HYDROCODONE-ACETAMINOPHEN 5-325 MG PO TABS
1.0000 | ORAL_TABLET | Freq: Once | ORAL | Status: AC
  Administered 2018-08-15: 1 via ORAL
  Filled 2018-08-15: qty 1

## 2018-08-15 MED ORDER — HYDROXYZINE HCL 25 MG PO TABS
25.0000 mg | ORAL_TABLET | Freq: Three times a day (TID) | ORAL | Status: DC | PRN
  Administered 2018-08-15 – 2018-08-16 (×2): 25 mg via ORAL
  Filled 2018-08-15 (×2): qty 1

## 2018-08-15 NOTE — ED Notes (Signed)
Patient stated she would like to use bedpan when she has to void.

## 2018-08-15 NOTE — ED Notes (Signed)
Patient transported to X-ray 

## 2018-08-15 NOTE — NC FL2 (Signed)
[place] MEDICAID FL2 LEVEL OF CARE SCREENING TOOL     IDENTIFICATION  Patient Name: Alexis Higgins Birthdate: [birth year] Sex: female Admission Date (Current Location): 08/15/2018  [facility] and [place] Number:  Herbalist and Address:  [facility] [place], [place] [address] [place], [place] [address]      Provider Number: [id number]  Attending Physician Name and Address:  [name], MD  Relative Name and Phone Number:       Current Level of Care: Hospital Recommended Level of Care: [place] Prior Approval Number:    Date Approved/Denied:   PASRR Number: [id number]  Discharge Plan: SNF    Current Diagnoses: Patient Active Problem List   Diagnosis Date Noted  . Chronic pain 07/17/2018  . Debility 07/17/2018  . Left flank pain 07/08/2018  . Pain 07/07/2018  . Acute respiratory failure with hypoxia ([place]) 06/07/2018  . Right hip pain 06/07/2018  . Elevated troponin   . COPD exacerbation ([place]) 05/28/2018  . Lymphedema 03/21/2018  . Left upper quadrant pain 12/21/2017  . 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 Insect bite, sequela 09/21/2017  . Hypokalemia 08/15/2017  . On home O2 07/27/2017  . Itching 07/08/2017  . Skin lesion 07/08/2017  . Ingrown nail 07/08/2017  . Back pain 10/06/2016  . Fracture of seventh thoracic vertebra ([place]) 10/06/2016  . Non-traumatic compression fracture of T7 thoracic vertebra   . Thoracic compression fracture ([place])   . Intractable back pain 09/10/2016  . Closed compression fracture of thoracic vertebra ([place])   . 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 Peripheral edema 06/15/2016  . Smoker 08/02/2014  . Peripheral vascular disease ([place]) 08/02/2014  . Abdominal pain, epigastric 11/22/2013  . Black stools 11/22/2013  . Displaced fracture of left femoral neck ([place]) 09/30/2013  . Fracture of femoral neck, left, closed ([place]) 09/30/2013  . Fall at home 09/30/2013  .  Head contusion 09/30/2013  . Leg weakness, bilateral 07/21/2012  . 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 Unstable angina pectoris ([place]) 04/12/2012  . Chronic respiratory failure ([place]) 02/13/2012  . Localized swelling, mass and lump, neck 01/06/2012  . CAD (coronary artery disease) 12/14/2011  . Dyspnea 11/22/2011  . Personal history of colonic polyps 09/06/2011  . Eczema 01/08/2011  . Rosacea 01/08/2011  . URI (upper respiratory infection) 01/08/2011  . Dizziness - light-headed 01/08/2011  . Palliative care encounter 01/08/2011  . Impaired glucose tolerance 01/07/2011  . Preventative health care 01/07/2011  . BURSITIS, RIGHT HIP 06/04/2009  . CAD, NATIVE VESSEL 01/15/2009  . Other symptoms involving cardiovascular system 01/15/2009  . CHEST PAIN-PRECORDIAL 01/15/2009  . OSTEOARTHRITIS, HIP 07/01/2008  . Hyperlipidemia 01/01/2007  . Anxiety state 01/01/2007  . 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 Essential hypertension 01/01/2007  . 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 LOW BACK PAIN 01/01/2007  . Osteoporosis 01/01/2007  . SYNCOPE 01/01/2007  . Headache(784.0) 01/01/2007  . TRANSIENT ISCHEMIC ATTACK, HX OF 01/01/2007    Orientation RESPIRATION BLADDER Height & Weight     Self, Time, Situation, Place  O2(nasal cannula) Continent Weight: 130 lb (59 kg) Height:  5\' 5"  (165.1 cm)  BEHAVIORAL SYMPTOMS/MOOD NEUROLOGICAL BOWEL NUTRITION STATUS      Continent Diet(Regular)  AMBULATORY STATUS COMMUNICATION OF NEEDS Skin   Extensive Assist Verbally (Past and present skin tears ans well as bruising all over ; stage 1 or 2 pressure sore on her bottom.)  Personal Care Assistance Level of Assistance  Bathing, Dressing Bathing Assistance: Maximum assistance   Dressing Assistance: Maximum assistance     Functional Limitations Info             SPECIAL CARE FACTORS FREQUENCY  PT (By licensed PT), OT (By licensed OT)      PT Frequency: 5 OT Frequency: 5            Contractures Contractures Info: Not present    Additional Factors Info  Code Status, Allergies Code Status Info: FULL Allergies Info: Aspirin, Codeine, Stiolto Respimat Tiotropium Bromide-olodaterol           Current Medications (08/15/2018):  This is the current hospital active medication list No current facility-administered medications for this encounter.    Current Outpatient Medications  Medication Sig Dispense Refill  . acetaminophen (TYLENOL) 500 MG tablet Take 1,000 mg by mouth every 6 (six) hours.     . calcitonin, salmon, (MIACALCIN/FORTICAL) 200 UNIT/ACT nasal spray Place 1 spray into alternate nostrils daily.    . Cholecalciferol (VITAMIN D) 1000 UNITS capsule Take 1,000 Units by mouth every evening.     . clopidogrel (PLAVIX) 75 MG tablet TAKE 1 TABLET BY MOUTH EVERY DAY (Patient taking differently: Take 75 mg by mouth daily. ) 90 tablet 2  . diclofenac sodium (VOLTAREN) 1 % GEL APPLY 4 GRAMS TOPICALLY 4 TIMES DAILY (Patient taking differently: Apply 1 g topically See admin instructions. For chronic back pain - apply with lidocaine patch to lower back twice daily) 100 g 0  . docusate sodium (COLACE) 100 MG capsule Take 100 mg by mouth at bedtime as needed for mild constipation.     Marland Kitchen escitalopram (LEXAPRO) 5 MG tablet TAKE 1 TABLET BY MOUTH EVERY DAY 90 tablet 3  . furosemide (LASIX) 20 MG tablet Take 1 tablet (20 mg total) by mouth 2 (two) times daily as needed. (Patient taking differently: Take 20 mg by mouth 2 (two) times daily as needed. fluid) 180 tablet 3  . hydrOXYzine (ATARAX/VISTARIL) 25 MG tablet TAKE 1 OR 2 TABLETS BY MOUTH EVERY 8 HOURS AS NEEDED FOR ANXIETY (Patient taking differently: Take 25 mg by mouth 2 (two) times daily. For itching) 540 tablet 2  . isosorbide mononitrate (IMDUR) 30 MG 24 hr tablet TAKE 0.5 TABLETS (15 MG TOTAL) BY MOUTH DAILY. (Patient taking differently: Take 15 mg by mouth daily. ) 45  tablet 3  . lidocaine (LIDODERM) 5 % Place 1 patch onto the skin daily. Remove & Discard patch within 12 hours or as directed by MD 30 patch 0  . lovastatin (MEVACOR) 20 MG tablet TAKE 20 MG BY MOUTH AT BEDTIME (Patient taking differently: Take 20 mg by mouth at bedtime. ) 90 tablet 1  . metoprolol tartrate (LOPRESSOR) 25 MG tablet TAKE 1 TABLET BY MOUTH TWICE A DAY NEEDS APPT (Patient taking differently: Take 25 mg by mouth 2 (two) times daily. ) 180 tablet 3  . nitroGLYCERIN (NITROSTAT) 0.4 MG SL tablet Place 1 tablet (0.4 mg total) under the tongue every 5 (five) minutes as needed for chest pain (up to 3 doses). 25 tablet 4  . ondansetron (ZOFRAN) 4 MG tablet Take 1 tablet (4 mg total) by mouth every 8 (eight) hours as needed for nausea or vomiting. (Patient taking differently: Take 4 mg by mouth 2 (two) times daily. ) 11 tablet 0  . oxyCODONE ER (XTAMPZA ER) 9 MG C12A Take 9 mg by mouth 2 (two) times daily. 60 each 0  .  OXYGEN Inhale 2 L into the lungs continuous.     . polyethylene glycol (MIRALAX / GLYCOLAX) packet Take 17 g by mouth daily as needed for mild constipation.     . polyvinyl alcohol (LIQUIFILM TEARS) 1.4 % ophthalmic solution Place 1 drop into both eyes daily as needed for dry eyes.     . potassium chloride (K-DUR) 10 MEQ tablet Take 1 tablet (10 mEq total) by mouth daily. 90 tablet 3  . predniSONE (DELTASONE) 10 MG tablet TAKE 1 & 1/2 TABLET(S) BY MOUTH DAILY WITH BREAKFAST (Patient taking differently: Take 15 mg by mouth daily with breakfast. ) 45 tablet 5  . PROAIR HFA 108 (90 Base) MCG/ACT inhaler INHALE 2 PUFFS INTO THE LUNGS EVERY 6 (SIX) HOURS AS NEEDED FOR WHEEZING OR SHORTNESS OF BREATH. 18 Inhaler 4  . tiotropium (SPIRIVA HANDIHALER) 18 MCG inhalation capsule Place 1 capsule (18 mcg total) into inhaler and inhale daily at 6 (six) AM. (Patient taking differently: Place 18 mcg into inhaler and inhale daily. ) 90 capsule 3  . traMADol (ULTRAM) 50 MG tablet Take 2 tablets (100  mg total) by mouth 2 (two) times daily. 12 tablet 0  . traZODone (DESYREL) 50 MG tablet Take 50 mg by mouth at bedtime.    . vitamin B-12 (CYANOCOBALAMIN) 1000 MCG tablet Take 1,000 mcg by mouth every evening.     . vitamin C (ASCORBIC ACID) 500 MG tablet Take 500 mg by mouth every evening.        Discharge Medications: Please see discharge summary for a list of discharge medications.  Relevant Imaging Results:  Relevant Lab Results:   Additional Information [id number].  Pt has HTN, COPD and CAD  Alphonse Guild [place]ford Staheli, LCSW

## 2018-08-15 NOTE — Progress Notes (Addendum)
CSW attempted via ED switchboard 5 times to contact pt either through pt's RN, pt's unit secretary or through pt's room phone and have been unsuccessful.  CSW needs to speak to pt directly.  10:11 PM CSW attempted via ED switchboard 2 times to contact pt either through pt's RN, pt's unit secretary or through pt's room phone and have been unsuccessful.  CSW will continue to follow for D/C needs.  Alphonse Guild. Mackey Varricchio, LCSW, LCAS, CSI Transitions of Care Clinical Social Worker Care Coordination Department Ph: [phone]     ]
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
## 2018-08-15 NOTE — Progress Notes (Addendum)
CSW spoke to CSW Asst Director who confirmed that with the pt having D/C'd from [place] [facility] on approx Feb 10th and the pt having less than the 60 days required by the insurance company [facility]) to received more authorized SNF days the pt is even less likely to receive more SNF days.    Per notes, on pt's last visit to [facility] pt's husband had stated that "Palliative" was to come see them on 2/17 and also that pt's family was unable at that time to pay "co-pay days" at a SNF and thus was unable to return to a SNF.  CSW Asst Director recommended CSW receive verbal permission to speak to pt's family with permission from the pt and receive collateral used to confirm can qualify for and will be amenable to Medicaid and long-term placement.  CSW will continue to follow for D/C needs.  Alphonse Guild. Kelli Egolf, LCSW, LCAS, CSI Transitions of Care Clinical Social Worker Care Coordination Department Ph: [phone]

## 2018-08-15 NOTE — Progress Notes (Signed)
CSW spoke with EDP who states EDP has made decision pt needs to remain in ED for placement and that pt would be unsafe for D/C at this time and that process for placement needs to begin. CSW will begin placement process and is aware that PT has just left pt's room.  CSW awaiting results of PT consult.  CSW will continue to follow for D/C needs.  Alphonse Guild. Sherial Ebrahim, LCSW, LCAS, CSI Transitions of Care Clinical Social Worker Care Coordination Department Ph: [phone]
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
## 2018-08-15 NOTE — ED Notes (Signed)
Pt husband Justin Buechner [phone]
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
## 2018-08-15 NOTE — Progress Notes (Signed)
2nd shift ED CSW received a handoff from the 1st shift [facility] ED CSW.   CSW will continue to follow for D/C needs.  Alphonse Guild. Ailis Rigaud, LCSW, LCAS, CSI Transitions of Care Clinical Social Worker Care Coordination Department Ph: [phone]

## 2018-08-15 NOTE — ED Notes (Signed)
Patient not in room at this time, Having testing done.
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
## 2018-08-16 ENCOUNTER — Emergency Department (HOSPITAL_COMMUNITY): Payer: Medicare Other

## 2018-08-16 DIAGNOSIS — T17908A Unspecified foreign body in respiratory tract, part unspecified causing other injury, initial encounter: Secondary | ICD-10-CM | POA: Diagnosis present

## 2018-08-16 DIAGNOSIS — J189 Pneumonia, unspecified organism: Secondary | ICD-10-CM | POA: Diagnosis present

## 2018-08-16 LAB — COMPREHENSIVE METABOLIC PANEL
ALT: 15 U/L (ref 0–44)
AST: 24 U/L (ref 15–41)
Albumin: 2.8 g/dL — ABNORMAL LOW (ref 3.5–5.0)
Alkaline Phosphatase: 108 U/L (ref 38–126)
Anion gap: 14 (ref 5–15)
BUN: 12 mg/dL (ref 8–23)
CO2: 28 mmol/L (ref 22–32)
Calcium: 8.5 mg/dL — ABNORMAL LOW (ref 8.9–10.3)
Chloride: 100 mmol/L (ref 98–111)
Creatinine, Ser: 0.73 mg/dL (ref 0.44–1.00)
GFR calc Af Amer: >60 mL/min (ref >60)
GFR calc non Af Amer: >60 mL/min (ref >60)
Glucose, Bld: 57 mg/dL — ABNORMAL LOW (ref 70–99)
Potassium: 3.8 mmol/L (ref 3.5–5.1)
SODIUM: 142 mmol/L (ref 135–145)
Total Bilirubin: 1.8 mg/dL — ABNORMAL HIGH (ref 0.3–1.2)
Total Protein: 5 g/dL — ABNORMAL LOW (ref 6.5–8.1)

## 2018-08-16 LAB — POCT I-STAT EG7
ACID-BASE EXCESS: 9 mmol/L — AB (ref 0.0–2.0)
Bicarbonate: 34.5 mmol/L — ABNORMAL HIGH (ref 20.0–28.0)
Calcium, Ion: 0.99 mmol/L — ABNORMAL LOW (ref 1.15–1.40)
HCT: 34 % — ABNORMAL LOW (ref 36.0–46.0)
Hemoglobin: 11.6 g/dL — ABNORMAL LOW (ref 12.0–15.0)
O2 Saturation: 98 %
PH VEN: 7.423 (ref 7.250–7.430)
Potassium: 4.9 mmol/L (ref 3.5–5.1)
Sodium: 137 mmol/L (ref 135–145)
TCO2: 36 mmol/L — ABNORMAL HIGH (ref 22–32)
pCO2, Ven: 52.8 mmHg (ref 44.0–60.0)
pO2, Ven: 113 mmHg — ABNORMAL HIGH (ref 32.0–45.0)

## 2018-08-16 LAB — CBC
HCT: 36.4 % (ref 36.0–46.0)
HEMOGLOBIN: 11.6 g/dL — AB (ref 12.0–15.0)
MCH: 29 pg (ref 26.0–34.0)
MCHC: 31.9 g/dL (ref 30.0–36.0)
MCV: 91 fL (ref 80.0–100.0)
Platelets: 219 10*3/uL (ref 150–400)
RBC: 4 MIL/uL (ref 3.87–5.11)
RDW: 14.9 % (ref 11.5–15.5)
WBC: 13.7 10*3/uL — ABNORMAL HIGH (ref 4.0–10.5)
nRBC: 0 % (ref 0.0–0.2)

## 2018-08-16 LAB — CBC WITH DIFFERENTIAL/PLATELET
Abs Immature Granulocytes: 0.29 10*3/uL — ABNORMAL HIGH (ref 0.00–0.07)
Basophils Absolute: 0.1 10*3/uL (ref 0.0–0.1)
Basophils Relative: 0 %
Eosinophils Absolute: 0.1 10*3/uL (ref 0.0–0.5)
Eosinophils Relative: 1 %
HCT: 38.7 % (ref 36.0–46.0)
Hemoglobin: 11.8 g/dL — ABNORMAL LOW (ref 12.0–15.0)
Immature Granulocytes: 2 %
Lymphocytes Relative: 9 %
Lymphs Abs: 1.3 10*3/uL (ref 0.7–4.0)
MCH: 29.1 pg (ref 26.0–34.0)
MCHC: 30.5 g/dL (ref 30.0–36.0)
MCV: 95.6 fL (ref 80.0–100.0)
Monocytes Absolute: 1.3 10*3/uL — ABNORMAL HIGH (ref 0.1–1.0)
Monocytes Relative: 9 %
Neutro Abs: 11 10*3/uL — ABNORMAL HIGH (ref 1.7–7.7)
Neutrophils Relative %: 79 %
Platelets: 210 10*3/uL (ref 150–400)
RBC: 4.05 MIL/uL (ref 3.87–5.11)
RDW: 14.8 % (ref 11.5–15.5)
WBC: 14 10*3/uL — ABNORMAL HIGH (ref 4.0–10.5)
nRBC: 0.1 % (ref 0.0–0.2)

## 2018-08-16 LAB — CREATININE, SERUM
Creatinine, Ser: 0.85 mg/dL (ref 0.44–1.00)
GFR calc Af Amer: >60 mL/min (ref >60)
GFR calc non Af Amer: >60 mL/min (ref >60)

## 2018-08-16 LAB — PROCALCITONIN: Procalcitonin: 0.25 ng/mL

## 2018-08-16 LAB — MRSA PCR SCREENING: MRSA BY PCR: NEGATIVE

## 2018-08-16 LAB — CBG MONITORING, ED: Glucose-Capillary: 57 mg/dL — ABNORMAL LOW (ref 70–99)

## 2018-08-16 MED ORDER — ENOXAPARIN SODIUM 30 MG/0.3ML ~~LOC~~ SOLN
30.0000 mg | SUBCUTANEOUS | Status: DC
  Filled 2018-08-16: qty 0.3

## 2018-08-16 MED ORDER — CALCITONIN (SALMON) 200 UNIT/ACT NA SOLN
1.0000 | Freq: Every day | NASAL | Status: DC
  Administered 2018-08-19 – 2018-08-24 (×6): 1 via NASAL
  Filled 2018-08-16: qty 3.7

## 2018-08-16 MED ORDER — UMECLIDINIUM BROMIDE 62.5 MCG/INH IN AEPB: 1.0000 | INHALATION_SPRAY | Freq: Every day | RESPIRATORY_TRACT | Status: DC

## 2018-08-16 MED ORDER — TRAMADOL HCL 50 MG PO TABS: 100.0000 mg | ORAL_TABLET | Freq: Two times a day (BID) | ORAL | Status: DC

## 2018-08-16 MED ORDER — ORAL CARE MOUTH RINSE
15.0000 mL | Freq: Two times a day (BID) | OROMUCOSAL | Status: DC
  Administered 2018-08-17 – 2018-08-24 (×10): 15 mL via OROMUCOSAL

## 2018-08-16 MED ORDER — ACETAMINOPHEN 500 MG PO TABS
1000.0000 mg | ORAL_TABLET | Freq: Four times a day (QID) | ORAL | Status: DC
  Administered 2018-08-16 – 2018-08-23 (×22): 1000 mg via ORAL
  Filled 2018-08-16 (×25): qty 2

## 2018-08-16 MED ORDER — VITAMIN D 25 MCG (1000 UNIT) PO TABS
1000.0000 [IU] | ORAL_TABLET | Freq: Every evening | ORAL | Status: DC
  Administered 2018-08-16 – 2018-08-22 (×7): 1000 [IU] via ORAL
  Filled 2018-08-16 (×8): qty 1

## 2018-08-16 MED ORDER — LORAZEPAM 1 MG PO TABS
0.5000 mg | ORAL_TABLET | Freq: Once | ORAL | Status: AC
  Administered 2018-08-16: 0.5 mg via ORAL
  Filled 2018-08-16: qty 1

## 2018-08-16 MED ORDER — FUROSEMIDE 20 MG PO TABS: 20.0000 mg | ORAL_TABLET | Freq: Two times a day (BID) | ORAL | Status: DC | PRN

## 2018-08-16 MED ORDER — POLYETHYLENE GLYCOL 3350 17 G PO PACK: 17.0000 g | PACK | Freq: Every day | ORAL | Status: DC | PRN

## 2018-08-16 MED ORDER — ISOSORBIDE MONONITRATE ER 30 MG PO TB24
15.0000 mg | ORAL_TABLET | Freq: Every day | ORAL | Status: DC
  Administered 2018-08-16 – 2018-08-23 (×8): 15 mg via ORAL
  Filled 2018-08-16 (×9): qty 1

## 2018-08-16 MED ORDER — PIPERACILLIN-TAZOBACTAM 3.375 G IVPB
3.3750 g | Freq: Three times a day (TID) | INTRAVENOUS | Status: DC
  Filled 2018-08-16: qty 50

## 2018-08-16 MED ORDER — ESCITALOPRAM OXALATE 10 MG PO TABS
5.0000 mg | ORAL_TABLET | Freq: Every day | ORAL | Status: DC
  Administered 2018-08-16 – 2018-08-23 (×8): 5 mg via ORAL
  Filled 2018-08-16 (×9): qty 1

## 2018-08-16 MED ORDER — PRAVASTATIN SODIUM 10 MG PO TABS
20.0000 mg | ORAL_TABLET | Freq: Every day | ORAL | Status: DC
  Administered 2018-08-16 – 2018-08-22 (×7): 20 mg via ORAL
  Filled 2018-08-16 (×8): qty 2

## 2018-08-16 MED ORDER — VITAMIN B-12 1000 MCG PO TABS
1000.0000 ug | ORAL_TABLET | Freq: Every evening | ORAL | Status: DC
  Administered 2018-08-16 – 2018-08-22 (×7): 1000 ug via ORAL
  Filled 2018-08-16 (×8): qty 1

## 2018-08-16 MED ORDER — METOPROLOL TARTRATE 5 MG/5ML IV SOLN: 2.5000 mg | Freq: Two times a day (BID) | INTRAVENOUS | Status: DC

## 2018-08-16 MED ORDER — LIDOCAINE 5 % EX PTCH: 1.0000 | MEDICATED_PATCH | CUTANEOUS | Status: DC | PRN

## 2018-08-16 MED ORDER — TRAMADOL HCL 50 MG PO TABS
50.0000 mg | ORAL_TABLET | Freq: Two times a day (BID) | ORAL | Status: DC
  Administered 2018-08-16: 50 mg via ORAL
  Filled 2018-08-16: qty 1

## 2018-08-16 MED ORDER — ONDANSETRON HCL 4 MG PO TABS: 4.0000 mg | ORAL_TABLET | Freq: Three times a day (TID) | ORAL | Status: DC | PRN

## 2018-08-16 MED ORDER — VITAMIN C 500 MG PO TABS
500.0000 mg | ORAL_TABLET | Freq: Every evening | ORAL | Status: DC
  Administered 2018-08-17 – 2018-08-22 (×6): 500 mg via ORAL
  Filled 2018-08-16 (×7): qty 1

## 2018-08-16 MED ORDER — LIDOCAINE 4 % EX PTCH: 1.0000 "application " | MEDICATED_PATCH | CUTANEOUS | Status: DC | PRN

## 2018-08-16 MED ORDER — ALBUTEROL SULFATE HFA 108 (90 BASE) MCG/ACT IN AERS: 2.0000 | INHALATION_SPRAY | Freq: Four times a day (QID) | RESPIRATORY_TRACT | Status: DC | PRN

## 2018-08-16 MED ORDER — PIPERACILLIN-TAZOBACTAM 3.375 G IVPB 30 MIN: 3.3750 g | Freq: Three times a day (TID) | INTRAVENOUS | Status: DC

## 2018-08-16 MED ORDER — PREDNISONE 5 MG PO TABS
15.0000 mg | ORAL_TABLET | Freq: Every day | ORAL | Status: DC
  Administered 2018-08-16 – 2018-08-23 (×8): 15 mg via ORAL
  Filled 2018-08-16 (×10): qty 1

## 2018-08-16 MED ORDER — SODIUM CHLORIDE 0.9 % IV SOLN
1.0000 g | Freq: Once | INTRAVENOUS | Status: AC
  Administered 2018-08-16: 1 g via INTRAVENOUS
  Filled 2018-08-16: qty 1

## 2018-08-16 MED ORDER — POLYVINYL ALCOHOL 1.4 % OP SOLN: 1.0000 [drp] | Freq: Every day | OPHTHALMIC | Status: DC | PRN

## 2018-08-16 MED ORDER — PIPERACILLIN-TAZOBACTAM 3.375 G IVPB 30 MIN: 3.3750 g | Freq: Once | INTRAVENOUS | Status: DC

## 2018-08-16 MED ORDER — OXYCODONE HCL ER 10 MG PO T12A: 10.0000 mg | EXTENDED_RELEASE_TABLET | Freq: Two times a day (BID) | ORAL | Status: DC

## 2018-08-16 MED ORDER — CLOPIDOGREL BISULFATE 75 MG PO TABS
75.0000 mg | ORAL_TABLET | Freq: Every day | ORAL | Status: DC
  Administered 2018-08-16 – 2018-08-23 (×8): 75 mg via ORAL
  Filled 2018-08-16 (×9): qty 1

## 2018-08-16 MED ORDER — HYDROXYZINE HCL 25 MG PO TABS: 25.0000 mg | ORAL_TABLET | Freq: Two times a day (BID) | ORAL | Status: DC

## 2018-08-16 MED ORDER — ALBUTEROL SULFATE (2.5 MG/3ML) 0.083% IN NEBU
5.0000 mg | INHALATION_SOLUTION | Freq: Once | RESPIRATORY_TRACT | Status: AC
  Administered 2018-08-16: 5 mg via RESPIRATORY_TRACT

## 2018-08-16 MED ORDER — ENOXAPARIN SODIUM 40 MG/0.4ML ~~LOC~~ SOLN
40.0000 mg | SUBCUTANEOUS | Status: DC
  Administered 2018-08-18 – 2018-08-23 (×5): 40 mg via SUBCUTANEOUS
  Filled 2018-08-16 (×6): qty 0.4

## 2018-08-16 MED ORDER — DOCUSATE SODIUM 100 MG PO CAPS: 100.0000 mg | ORAL_CAPSULE | Freq: Every evening | ORAL | Status: DC | PRN

## 2018-08-16 MED ORDER — TRAZODONE HCL 50 MG PO TABS
50.0000 mg | ORAL_TABLET | Freq: Every day | ORAL | Status: DC
  Administered 2018-08-16: 50 mg via ORAL
  Filled 2018-08-16: qty 1

## 2018-08-16 MED ORDER — FUROSEMIDE 20 MG PO TABS
20.0000 mg | ORAL_TABLET | Freq: Every day | ORAL | Status: DC
  Administered 2018-08-16 – 2018-08-22 (×7): 20 mg via ORAL
  Filled 2018-08-16 (×8): qty 1

## 2018-08-16 MED ORDER — HYDROCODONE-ACETAMINOPHEN 5-325 MG PO TABS: 0.5000 | ORAL_TABLET | Freq: Four times a day (QID) | ORAL | Status: DC | PRN

## 2018-08-16 MED ORDER — METOPROLOL TARTRATE 25 MG PO TABS
25.0000 mg | ORAL_TABLET | Freq: Two times a day (BID) | ORAL | Status: DC
  Administered 2018-08-16 – 2018-08-19 (×8): 25 mg via ORAL
  Filled 2018-08-16 (×8): qty 1

## 2018-08-16 MED ORDER — ALBUTEROL SULFATE (2.5 MG/3ML) 0.083% IN NEBU: 2.5000 mg | INHALATION_SOLUTION | Freq: Four times a day (QID) | RESPIRATORY_TRACT | Status: DC | PRN

## 2018-08-16 NOTE — Progress Notes (Addendum)
8:54am-CSW spoke [employer] with APS and she expressed that she would be to see pt around noon today   8:18am-CSW spoke with [facility] APS to confirm that report was made on pt. CSW was advised that case was assigned to [employer] [phone]. CSW left message asking that Crystal call CSW back.   CSW went to speak with pt at bedside. Pt lying in bed shaking. CSW confirmed with pt that pt was okay. CSW spoke with pt about placement. Pt expressed that she doesn't want to return home and doesn't wish for CSW to call anyone in her family. Pt expressed "I dont wanna be around that". CSW informed pt that C SW on lat night faxed pt out however at thsi time pt has no offers. CSW doesn't feel that pt is medically appropriate for a facility as pt is fragile and does need further care.    CSW sought details from pt on applying for Medicaid and pt expressed "idk". CSW did inform pt on how to apply for this. Pt shook head in understanding CSW to follow up with facilities for acceptances.    Virgie Dad Evola Hollis, MSW, [facility] Clinical Social Worker [phone]

## 2018-08-16 NOTE — Progress Notes (Signed)
Patient seen and reviewed and examined Full note to follow if admitted Will dispo based on Ct chest and Procalcitonin.   Verneita Griffes, MD [facility] 10:23 AM

## 2018-08-16 NOTE — ED Notes (Signed)
MD aware of BP. Will continue to monitor.
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
## 2018-08-16 NOTE — Progress Notes (Signed)
Received report from ED nurse Vicente Males and asked about her giving pt her scheduled meds, RN stated "I've had other things to do besides giving her scheduled meds." When asked if she would give them before bringing the patient up and what's being done for the patient's elevated bp was told "pt is already on the way to the floor".

## 2018-08-16 NOTE — Progress Notes (Signed)
9:50am - CSW spoke with MD and was advised that pt may be admitted due to labs. MD expressed that pt may not be appropriate for SNF at this time. CSW will follow up for further admission plans at this time.   [place], [place]

## 2018-08-16 NOTE — Progress Notes (Signed)
ER nurse didn't give pt any of her scheduled medication.

## 2018-08-16 NOTE — Progress Notes (Signed)
Pt arrived to floor via stretcher. Pt was oriented to room & call bell system. PT confused. VS have been taken. Pt in no apparent distress at this time. CCMD has been notified, Pt has been placed on tele & skin has been assessed. Spoke with family multiple times regarding pt care.

## 2018-08-16 NOTE — ED Provider Notes (Signed)
Care assumed at 0800. Patient presents to the emergency department after a fall, history of recurrent falls. Her plain films have been negative for fracture. On assessment she is tachycardic, tachypnea. She is complaining of shortness of breath and abdominal pain. She does have generalized tenderness on examination with decreased air movement on lung exam. Will check repeat labs and provide albuterol and reassess.  CXR concerning for possible pna, CBC with increasing leukocytosis - will treat with abx, hospitalist consulted for admission.     Quintella Reichert, MD 08/16/18 [phone]

## 2018-08-16 NOTE — ED Notes (Signed)
ED TO INPATIENT HANDOFF REPORT  ED Nurse Name and Phone #: [phone]  S Name/Age/Gender Alexis Higgins 78 y.o. female Room/Bed: 018C/018C  Code Status   Code Status: DNR  Home/SNF/Other Skilled nursing facility Patient oriented to: self Is this baseline? No   Triage Complete: Triage complete  Chief Complaint Fall; Lower Back Pain  Triage Note Pt BIB [facility] coming from home. Pt complaining of right sided back pain. Patient states that her husband dropped her when moving her from her wheelchair to bedside commode.   Allergies Allergies  Allergen Reactions  . Aspirin Other (See Comments)     cns bleed risk  . Codeine Anaphylaxis and Rash  . Stiolto Respimat [Tiotropium Bromide-Olodaterol] Other (See Comments)    Caused inability to breath/ severe reaction - reported in 2015    Level of Care/Admitting Diagnosis ED Disposition    ED Disposition Condition [place]: [facility] [[id number]]  Level of Care: Progressive [102]  Diagnosis: Aspiration into airway [3154008]  Admitting Physician: Nita Sells [phone]  Attending Physician: Nita Sells [phone]  Estimated length of stay: 3 - 4 days  Certification:: I certify this patient will need inpatient services for at least 2 midnights  PT Class (Do Not Modify): Inpatient [101]  PT Acc Code (Do Not Modify): Private [1]       B Medical/Surgery History Past Medical History:  Diagnosis Date  . ANXIETY 01/01/2007  . Blood transfusion without reported diagnosis 2015  . BURSITIS, RIGHT HIP 06/04/2009  . CAD (coronary artery disease)    a. BMS to LAD 2010. b. NSTEMI with DES to LAD for ISR 2011. c. Patent stent 03/2012/Imdur added.  . Cataract   . Colon polyps    H/o tubular adenoma of colon  . COPD 01/01/2007   a. Chronic resp failure on home O2.  Marland Kitchen DEPRESSION 01/01/2007  . Eczema 01/08/2011  . GROIN PAIN 06/20/2008  . Headache(784.0) 01/01/2007  . Hemorrhoid   .  HYPERTENSION 01/01/2007  . Impaired glucose tolerance 01/07/2011  . LOW BACK PAIN 01/01/2007  . Muscle weakness (generalized) 06/04/2009  . On home oxygen therapy    "2L; 24/7" (03/21/2018)  . OSTEOARTHRITIS, HIP 07/01/2008  . OSTEOPOROSIS 01/01/2007  . Pneumonia 1998  . Rosacea 01/08/2011  . SYNCOPE 01/01/2007  . Thoracic compression fracture ([place])   . TRANSIENT ISCHEMIC ATTACK, HX OF 01/01/2007   Past Surgical History:  Procedure Laterality Date  . ABDOMINAL HYSTERECTOMY    . APPENDECTOMY    . BACK SURGERY    . BREAST ENHANCEMENT SURGERY    . COLONOSCOPY  01/25/2002   tubular adenoma,hemorrhoids, hyperplastic  colon polyps  . COLONOSCOPY  02/17/2005   hemorrhoids  . CORONARY ANGIOPLASTY    . CORONARY STENT PLACEMENT    . ESOPHAGOGASTRODUODENOSCOPY (EGD) WITH PROPOFOL N/A 08/10/2017   Procedure: ESOPHAGOGASTRODUODENOSCOPY (EGD) WITH PROPOFOL;  Surgeon: Gatha Mayer, MD;  Location: [facility];  Service: Endoscopy;  Laterality: N/A;  . HIP ARTHROPLASTY Left 10/01/2013   Procedure: ARTHROPLASTY UNIPOLAR   HIP;  Surgeon: Marianna Payment, MD;  Location: [facility];  Service: Orthopedics;  Laterality: Left;  . HIP SURGERY Left    DR XU     PROXIMAL NECK   . IR GENERIC HISTORICAL  02/19/2016   IR VERTEBROPLASTY CERV/THOR BX INC UNI/BIL INC/INJECT/IMAGING 02/19/2016 Luanne Bras, MD [facility]  . IR GENERIC HISTORICAL  07/15/2016   IR KYPHO THORACIC WITH BONE BIOPSY 07/15/2016 Luanne Bras, MD [facility]  .  IR KYPHO EA ADDL LEVEL THORACIC OR LUMBAR  09/14/2016  . IR RADIOLOGIST EVAL & MGMT  09/22/2016  . IR VERTEBROPLASTY CERV/THOR BX INC UNI/BIL INC/INJECT/IMAGING  10/08/2016  . IR VERTEBROPLASTY CERV/THOR BX INC UNI/BIL INC/INJECT/IMAGING  11/16/2016  . KYPHOPLASTY  12/2015-10/2016 X 5   "?2lumbar & 3 thoracic"  . LEFT HEART CATHETERIZATION WITH CORONARY ANGIOGRAM N/A 04/13/2012   Procedure: LEFT HEART CATHETERIZATION WITH CORONARY ANGIOGRAM;  Surgeon: Burnell Blanks, MD;   Location: [facility] CATH LAB;  Service: Cardiovascular;  Laterality: N/A;  . OOPHORECTOMY     one ovary  . s/p bilat cataract  2010  . sp lumbar disc surgury     [name]     A IV Location/Drains/Wounds Patient Lines/Drains/Airways Status   Active Line/Drains/Airways    Name:   Placement date:   Placement time:   Site:   Days:   Peripheral IV 08/15/18 Right Wrist   08/15/18    1151    Wrist   1          Intake/Output Last 24 hours  Intake/Output Summary (Last 24 hours) at 08/16/2018 1210 Last data filed at 08/15/2018 1409 Gross per 24 hour  Intake 500 ml  Output -  Net 500 ml    Labs/Imaging Results for orders placed or performed during the hospital encounter of 08/15/18 (from the past 48 hour(s))  Basic metabolic panel     Status: Abnormal   Collection Time: 08/15/18 11:41 AM  Result Value Ref Range   Sodium 141 135 - 145 mmol/L   Potassium 3.8 3.5 - 5.1 mmol/L   Chloride 104 98 - 111 mmol/L   CO2 31 22 - 32 mmol/L   Glucose, Bld 77 70 - 99 mg/dL   BUN 17 8 - 23 mg/dL   Creatinine, Ser 0.94 0.44 - 1.00 mg/dL   Calcium 8.5 (L) 8.9 - 10.3 mg/dL   GFR calc non Af Amer 59 (L) >60 mL/min   GFR calc Af Amer >60 >60 mL/min   Anion gap 6 5 - 15    Comment: Performed at [facility], [address] [place], [place] [address]  CBC with Differential     Status: Abnormal   Collection Time: 08/15/18 11:41 AM  Result Value Ref Range   WBC 12.5 (H) 4.0 - 10.5 K/uL   RBC 4.12 3.87 - 5.11 MIL/uL   Hemoglobin 11.6 (L) 12.0 - 15.0 g/dL   HCT 38.8 36.0 - 46.0 %   MCV 94.2 80.0 - 100.0 fL   MCH 28.2 26.0 - 34.0 pg   MCHC 29.9 (L) 30.0 - 36.0 g/dL   RDW 15.2 11.5 - 15.5 %   Platelets 209 150 - 400 K/uL   nRBC 0.0 0.0 - 0.2 %   Neutrophils Relative % 80 %   Neutro Abs 10.0 (H) 1.7 - 7.7 K/uL   Lymphocytes Relative 7 %   Lymphs Abs 0.9 0.7 - 4.0 K/uL   Monocytes Relative 9 %   Monocytes Absolute 1.1 (H) 0.1 - 1.0 K/uL   Eosinophils Relative 0 %   Eosinophils Absolute  0.0 0.0 - 0.5 K/uL   Basophils Relative 0 %   Basophils Absolute 0.0 0.0 - 0.1 K/uL   Immature Granulocytes 4 %   Abs Immature Granulocytes 0.47 (H) 0.00 - 0.07 K/uL    Comment: Performed at [facility], [address], [place], [place] [address]  Urinalysis, Routine w reflex microscopic     Status: Abnormal  Collection Time: 08/15/18  9:00 PM  Result Value Ref Range   Color, Urine YELLOW YELLOW   APPearance CLEAR CLEAR   Specific Gravity, Urine 1.021 1.005 - 1.030   pH 5.0 5.0 - 8.0   Glucose, UA NEGATIVE NEGATIVE mg/dL   Hgb urine dipstick NEGATIVE NEGATIVE   Bilirubin Urine NEGATIVE NEGATIVE   Ketones, ur 20 (A) NEGATIVE mg/dL   Protein, ur NEGATIVE NEGATIVE mg/dL   Nitrite NEGATIVE NEGATIVE   Leukocytes,Ua NEGATIVE NEGATIVE    Comment: Performed at [facility] [address], [place], [place] [address]  CBC with Differential     Status: Abnormal   Collection Time: 08/16/18  8:35 AM  Result Value Ref Range   WBC 14.0 (H) 4.0 - 10.5 K/uL   RBC 4.05 3.87 - 5.11 MIL/uL   Hemoglobin 11.8 (L) 12.0 - 15.0 g/dL   HCT 38.7 36.0 - 46.0 %   MCV 95.6 80.0 - 100.0 fL   MCH 29.1 26.0 - 34.0 pg   MCHC 30.5 30.0 - 36.0 g/dL   RDW 14.8 11.5 - 15.5 %   Platelets 210 150 - 400 K/uL   nRBC 0.1 0.0 - 0.2 %   Neutrophils Relative % 79 %   Neutro Abs 11.0 (H) 1.7 - 7.7 K/uL   Lymphocytes Relative 9 %   Lymphs Abs 1.3 0.7 - 4.0 K/uL   Monocytes Relative 9 %   Monocytes Absolute 1.3 (H) 0.1 - 1.0 K/uL   Eosinophils Relative 1 %   Eosinophils Absolute 0.1 0.0 - 0.5 K/uL   Basophils Relative 0 %   Basophils Absolute 0.1 0.0 - 0.1 K/uL   Immature Granulocytes 2 %   Abs Immature Granulocytes 0.29 (H) 0.00 - 0.07 K/uL    Comment: Performed at [facility] [address], [place], [place] [address]  POCT I-Stat EG7     Status: Abnormal   Collection Time: 08/16/18  8:42 AM  Result Value Ref Range   pH, Ven 7.423 7.250 - 7.430   pCO2, Ven 52.8 44.0 - 60.0 mmHg   pO2, Ven  113.0 (H) 32.0 - 45.0 mmHg   Bicarbonate 34.5 (H) 20.0 - 28.0 mmol/L   TCO2 36 (H) 22 - 32 mmol/L   O2 Saturation 98.0 %   Acid-Base Excess 9.0 (H) 0.0 - 2.0 mmol/L   Sodium 137 135 - 145 mmol/L   Potassium 4.9 3.5 - 5.1 mmol/L   Calcium, Ion 0.99 (L) 1.15 - 1.40 mmol/L   HCT 34.0 (L) 36.0 - 46.0 %   Hemoglobin 11.6 (L) 12.0 - 15.0 g/dL   Patient temperature HIDE    Sample type VENOUS   Procalcitonin - Baseline     Status: None   Collection Time: 08/16/18 10:02 AM  Result Value Ref Range   Procalcitonin 0.25 ng/mL    Comment:        Interpretation: PCT (Procalcitonin) <= 0.5 ng/mL: Systemic infection (sepsis) is not likely. Local bacterial infection is possible. (NOTE)       Sepsis PCT Algorithm           Lower Respiratory Tract                                      Infection PCT Algorithm    ----------------------------     ----------------------------         PCT < 0.25 ng/mL  PCT < 0.10 ng/mL         Strongly encourage             Strongly discourage   discontinuation of antibiotics    initiation of antibiotics    ----------------------------     -----------------------------       PCT 0.25 - 0.50 ng/mL            PCT 0.10 - 0.25 ng/mL               OR       >80% decrease in PCT            Discourage initiation of                                            antibiotics      Encourage discontinuation           of antibiotics    ----------------------------     -----------------------------         PCT >= 0.50 ng/mL              PCT 0.26 - 0.50 ng/mL               AND        <80% decrease in PCT             Encourage initiation of                                             antibiotics       Encourage continuation           of antibiotics    ----------------------------     -----------------------------        PCT >= 0.50 ng/mL                  PCT > 0.50 ng/mL               AND         increase in PCT                  Strongly encourage                                       initiation of antibiotics    Strongly encourage escalation           of antibiotics                                     -----------------------------                                           PCT <= 0.25 ng/mL                                                 OR                                        >  80% decrease in PCT                                     Discontinue / Do not initiate                                             antibiotics Performed at [facility], [place] [address], [place], [place] [address]   Comprehensive metabolic panel     Status: Abnormal   Collection Time: 08/16/18 10:31 AM  Result Value Ref Range   Sodium 142 135 - 145 mmol/L   Potassium 3.8 3.5 - 5.1 mmol/L   Chloride 100 98 - 111 mmol/L   CO2 28 22 - 32 mmol/L   Glucose, Bld 57 (L) 70 - 99 mg/dL   BUN 12 8 - 23 mg/dL   Creatinine, Ser 0.73 0.44 - 1.00 mg/dL   Calcium 8.5 (L) 8.9 - 10.3 mg/dL   Total Protein 5.0 (L) 6.5 - 8.1 g/dL   Albumin 2.8 (L) 3.5 - 5.0 g/dL   AST 24 15 - 41 U/L   ALT 15 0 - 44 U/L   Alkaline Phosphatase 108 38 - 126 U/L   Total Bilirubin 1.8 (H) 0.3 - 1.2 mg/dL   GFR calc non Af Amer >60 >60 mL/min   GFR calc Af Amer >60 >60 mL/min   Anion gap 14 5 - 15    Comment: Performed at [facility], [place] [address], [place], [place] [address]  CBG monitoring, ED     Status: Abnormal   Collection Time: 08/16/18 11:30 AM  Result Value Ref Range   Glucose-Capillary 57 (L) 70 - 99 mg/dL   Dg Chest 1 View  Result Date: 08/15/2018 CLINICAL DATA:  78 year old with acute mental status changes, difficulty speaking, and back pain. Patient's husband dropped her inadvertently while transferring her from bedside to her commode earlier today. Initial encounter. EXAM: CHEST  1 VIEW COMPARISON:  07/07/2018 and earlier. FINDINGS: Cardiac silhouette normal in size, unchanged. Coronary artery stent. Thoracic aorta mildly atherosclerotic, unchanged. Hilar and mediastinal contours  otherwise unremarkable. Prominent bronchovascular markings diffusely and mild central peribronchial thickening, unchanged. Emphysematous changes in the UPPER lobes, unchanged. No new pulmonary parenchymal abnormalities. BILATERAL breast implants with capsular calcifications. IMPRESSION: 1.  No acute cardiopulmonary disease. 2. Aortic Atherosclerosis (ICD10-I70.0) and Emphysema (ICD10-J43.9). Electronically Signed   By: Evangeline Dakin M.D.   On: 08/15/2018 12:57   Dg Thoracic Spine 2 View  Result Date: 08/15/2018 CLINICAL DATA:  78 year old with acute mental status changes, difficulty speaking, and back pain. Patient's husband dropped her inadvertently while transferring her from bedside to her commode earlier today. Initial encounter. EXAM: THORACIC SPINE 2 VIEWS COMPARISON:  Bone window images from CTA chest 05/29/2018. Multiple prior chest x-rays. MRI thoracic spine 11/09/2016. FINDINGS: Twelve rib-bearing thoracic vertebrae. Exaggeration of the usual thoracic kyphosis. Prior augmentation of T5, T6, T7, T10 and T11. Stable mild compression fracture of T4 since the prior CT. No new/acute thoracic spine fractures. IMPRESSION: 1. No acute osseous abnormality. 2. Stable mild compression fracture of T4 since the prior CT from January, 2020. 3. Prior augmentation of T5, T6, T7, T10 and T11. Electronically Signed   By: Evangeline Dakin M.D.   On: 08/15/2018 13:04   Dg Lumbar Spine  2-3 Views  Result Date: 08/15/2018 CLINICAL DATA:  78 year old with acute mental status changes, difficulty speaking, and back pain. Patient's husband dropped her inadvertently while transferring her from bedside to her commode earlier today. Initial encounter. EXAM: LUMBAR SPINE - 2-3 VIEW COMPARISON:  Bone window images from CT abdomen and pelvis 07/07/2018, 06/21/2017. FINDINGS: Five non-rib-bearing lumbar vertebrae with anatomic alignment. No fractures. Severe osseous demineralization. Severe disc space narrowing and associated  endplate hypertrophic changes at L4-5. Moderate disc space narrowing at L3-4. Severe aortoiliac atherosclerosis without evidence of aneurysm. Prior LEFT hip arthroplasty with anatomic alignment in the AP projection. IMPRESSION: 1. No acute osseous abnormality. 2. Severe degenerative disc disease at L4-5 and moderate degenerative disc disease at L3-4. 3. Severe osseous demineralization. Electronically Signed   By: Evangeline Dakin M.D.   On: 08/15/2018 13:00   Ct Head Wo Contrast  Result Date: 08/15/2018 CLINICAL DATA:  Trauma. EXAM: CT HEAD WITHOUT CONTRAST CT CERVICAL SPINE WITHOUT CONTRAST TECHNIQUE: Multidetector CT imaging of the head and cervical spine was performed following the standard protocol without intravenous contrast. Multiplanar CT image reconstructions of the cervical spine were also generated. COMPARISON:  CT scan of Sep 30, 2013. FINDINGS: CT HEAD FINDINGS Brain: Mild diffuse cortical atrophy is noted. Mild chronic ischemic white matter disease is noted. Old right cerebellar infarction is noted. No mass effect or midline shift is noted. Ventricular size is within normal limits. There is no evidence of mass lesion, hemorrhage or acute infarction. Vascular: No hyperdense vessel or unexpected calcification. Skull: Normal. Negative for fracture or focal lesion. Sinuses/Orbits: No acute finding. Other: None. CT CERVICAL SPINE FINDINGS Alignment: Normal. Skull base and vertebrae: No acute fracture. No primary bone lesion or focal pathologic process. Soft tissues and spinal canal: No prevertebral fluid or swelling. No visible canal hematoma. Disc levels: Severe degenerative disc disease is noted at C3-4, C4-5 and C6-7. Moderate degenerative disc disease is noted at C5-6. Upper chest: Negative. Other: None. IMPRESSION: Mild diffuse cortical atrophy. Mild chronic ischemic white matter disease. Old right cerebellar infarction. No acute intracranial abnormality seen. Multilevel degenerative disc disease.  No acute abnormality seen in the cervical spine. Electronically Signed   By: Marijo Conception, M.D.   On: 08/15/2018 13:44   Ct Chest Wo Contrast  Result Date: 08/16/2018 CLINICAL DATA:  Acute respiratory illness EXAM: CT CHEST WITHOUT CONTRAST TECHNIQUE: Multidetector CT imaging of the chest was performed following the standard protocol without IV contrast. Sagittal and coronal MPR images reconstructed from axial data set. COMPARISON:  05/29/2018 Correlation: Chest radiograph 08/16/2018 FINDINGS: Cardiovascular: Atherosclerotic calcifications of aorta and coronary arteries. Aorta normal caliber. Heart unremarkable. No pericardial effusion. Mediastinum/Nodes: Esophagus normal appearance. Base of cervical region normal appearance. No thoracic adenopathy. Incidentally noted capsular calcification at BILATERAL breast prostheses. Lungs/Pleura: Small LEFT pleural effusion with minimal compressive atelectasis of posterior LEFT lower lobe. Emphysematous changes. Mild central peribronchial thickening. No acute infiltrate, RIGHT pleural effusion, or pneumothorax. Opacity at posterior aspect of LEFT upper lobe 19 x 8 x 10 mm, not seen on prior exam, question atelectasis though requiring follow-up to ensure stability and exclude mass. Upper Abdomen: Visualized upper abdomen unremarkable Musculoskeletal: Osseous demineralization with multiple healing LEFT rib fractures and multiple prior thoracic spine vertebral augmentation procedures. IMPRESSION: COPD changes with small LEFT pleural effusion and minimal compressive atelectasis of posterior LEFT lower lobe. Opacity at posterior LEFT upper lobe 19 x 8 x 10 mm question atelectasis though mass not completely excluded; short-term follow-up CT recommended in 2-3 months  to ensure resolution and exclude tumor. Osteoporosis. Aortic Atherosclerosis (ICD10-I70.0) and Emphysema (ICD10-J43.9). Electronically Signed   By: Lavonia Dana M.D.   On: 08/16/2018 11:05   Ct Cervical Spine Wo  Contrast  Result Date: 08/15/2018 CLINICAL DATA:  Trauma. EXAM: CT HEAD WITHOUT CONTRAST CT CERVICAL SPINE WITHOUT CONTRAST TECHNIQUE: Multidetector CT imaging of the head and cervical spine was performed following the standard protocol without intravenous contrast. Multiplanar CT image reconstructions of the cervical spine were also generated. COMPARISON:  CT scan of Sep 30, 2013. FINDINGS: CT HEAD FINDINGS Brain: Mild diffuse cortical atrophy is noted. Mild chronic ischemic white matter disease is noted. Old right cerebellar infarction is noted. No mass effect or midline shift is noted. Ventricular size is within normal limits. There is no evidence of mass lesion, hemorrhage or acute infarction. Vascular: No hyperdense vessel or unexpected calcification. Skull: Normal. Negative for fracture or focal lesion. Sinuses/Orbits: No acute finding. Other: None. CT CERVICAL SPINE FINDINGS Alignment: Normal. Skull base and vertebrae: No acute fracture. No primary bone lesion or focal pathologic process. Soft tissues and spinal canal: No prevertebral fluid or swelling. No visible canal hematoma. Disc levels: Severe degenerative disc disease is noted at C3-4, C4-5 and C6-7. Moderate degenerative disc disease is noted at C5-6. Upper chest: Negative. Other: None. IMPRESSION: Mild diffuse cortical atrophy. Mild chronic ischemic white matter disease. Old right cerebellar infarction. No acute intracranial abnormality seen. Multilevel degenerative disc disease. No acute abnormality seen in the cervical spine. Electronically Signed   By: Marijo Conception, M.D.   On: 08/15/2018 13:44   Dg Chest Port 1 View  Result Date: 08/16/2018 CLINICAL DATA:  Shortness of breath EXAM: PORTABLE CHEST 1 VIEW COMPARISON:  August 15, 2018 and July 08, 2018 FINDINGS: The patient is status post vertebroplasties at multiple levels. The cardiomediastinal silhouette is normal. Evaluation for pneumothoraces is limited as the patient's chin is  slumped over the upper chest. Within this limitation, no pneumothorax. Evaluation of the bases is somewhat limited due to overlapping implants. Within this limitation, no focal infiltrate is seen on the right. There is increased opacity in the left base. No other changes. IMPRESSION: Increasing opacity in the left base.  No other acute abnormalities. Electronically Signed   By: Dorise Bullion III M.D   On: 08/16/2018 08:30    Pending Labs Unresulted Labs (From admission, onward)    Start     Ordered   08/17/18 0500  Procalcitonin  Daily,   R     08/16/18 1001   Signed and Held  Culture, blood (routine x 2) Call MD if unable to obtain prior to antibiotics being given  BLOOD CULTURE X 2,   R    Comments:  If blood cultures drawn in Emergency Department - Do not draw and cancel order    Signed and Held   Signed and Held  Culture, sputum-assessment  Once,   R     Signed and Held   Signed and Held  Gram stain  Once,   R     Signed and Held   Signed and Held  HIV antibody (Routine Screening)  Once,   R     Signed and Held   Signed and Held  Strep pneumoniae urinary antigen  Once,   R     Signed and Held   Signed and Held  CBC  (enoxaparin (LOVENOX)    CrCl < 30 ml/min)  Once,   R    Comments:  Baseline  for enoxaparin therapy IF NOT ALREADY DRAWN.  Notify MD if PLT < 100 K.    Signed and Held   Signed and Held  Creatinine, serum  (enoxaparin (LOVENOX)    CrCl < 30 ml/min)  Once,   R    Comments:  Baseline for enoxaparin therapy IF NOT ALREADY DRAWN.    Signed and Held   Signed and Held  Creatinine, serum  (enoxaparin (LOVENOX)    CrCl < 30 ml/min)  Weekly,   R    Comments:  while on enoxaparin therapy.    Signed and Held   Signed and Held  Legionella Pneumophila Serogp 1 Ur Ag  Once,   R     Signed and Held          Vitals/Pain Today's Vitals   08/16/18 0847 08/16/18 0848 08/16/18 0900 08/16/18 0915  BP:  (!) 189/104  (!) 180/111  Pulse: (!) 104 (!) 125    Resp: 19 (!) 23 (!) 24  (!) 22  Temp:      TempSrc:      SpO2: (!) 89% 92% 100%   Weight:      Height:      PainSc:        Isolation Precautions No active isolations  Medications Medications  hydrOXYzine (ATARAX/VISTARIL) tablet 25 mg (25 mg Oral Given 08/15/18 2115)  calcitonin (salmon) (MIACALCIN/FORTICAL) nasal spray 1 spray (has no administration in time range)  cholecalciferol (VITAMIN D3) tablet 1,000 Units (has no administration in time range)  clopidogrel (PLAVIX) tablet 75 mg (has no administration in time range)  escitalopram (LEXAPRO) tablet 5 mg (has no administration in time range)  isosorbide mononitrate (IMDUR) 24 hr tablet 15 mg (15 mg Oral Given 08/16/18 0850)  pravastatin (PRAVACHOL) tablet 20 mg (has no administration in time range)  metoprolol tartrate (LOPRESSOR) tablet 25 mg (25 mg Oral Given 08/16/18 0850)  ondansetron (ZOFRAN) tablet 4 mg (has no administration in time range)  polyvinyl alcohol (LIQUIFILM TEARS) 1.4 % ophthalmic solution 1 drop (has no administration in time range)  predniSONE (DELTASONE) tablet 15 mg (has no administration in time range)  umeclidinium bromide (INCRUSE ELLIPTA) 62.5 MCG/INH 1 puff (has no administration in time range)  traZODone (DESYREL) tablet 50 mg (has no administration in time range)  vitamin B-12 (CYANOCOBALAMIN) tablet 1,000 mcg (has no administration in time range)  vitamin C (ASCORBIC ACID) tablet 500 mg (has no administration in time range)  metoprolol tartrate (LOPRESSOR) injection 2.5 mg (has no administration in time range)  piperacillin-tazobactam (ZOSYN) IVPB 3.375 g (has no administration in time range)  furosemide (LASIX) tablet 20 mg (has no administration in time range)  traMADol (ULTRAM) tablet 50 mg (has no administration in time range)  HYDROcodone-acetaminophen (NORCO/VICODIN) 5-325 MG per tablet 1 tablet (1 tablet Oral Given 08/15/18 1154)  Tdap (BOOSTRIX) injection 0.5 mL (0.5 mLs Intramuscular Given 08/15/18 1157)  sodium  chloride 0.9 % bolus 250 mL (0 mLs Intravenous Stopped 08/15/18 1245)  sodium chloride 0.9 % bolus 250 mL (0 mLs Intravenous Stopped 08/15/18 1409)  LORazepam (ATIVAN) tablet 0.5 mg (0.5 mg Oral Given 08/16/18 0018)  albuterol (PROVENTIL) (2.5 MG/3ML) 0.083% nebulizer solution 5 mg (5 mg Nebulization Given 08/16/18 0858)  ceFEPIme (MAXIPIME) 1 g in sodium chloride 0.9 % 100 mL IVPB (1 g Intravenous New Bag/Given 08/16/18 1009)    Mobility non-ambulatory - ambulates with assist at home - OT was refused by pt x 2 visits High fall risk   Focused Assessments Cardiac  Assessment Handoff:  Cardiac Rhythm: Sinus tachycardia Lab Results  Component Value Date   CKTOTAL 56 01/27/2010   CKMB 4.6 (H) 01/27/2010   TROPONINI 0.17 (HH) 06/08/2018   Lab Results  Component Value Date   DDIMER 1.34 (H) 05/28/2018   Does the Patient currently have chest pain? No     R Recommendations: See Admitting Provider Note  Report given to:   Additional Notes: none

## 2018-08-16 NOTE — Progress Notes (Signed)
Spoke to pt daughter Elmyra Ricks [phone] multiple times over course of shift.

## 2018-08-16 NOTE — Progress Notes (Signed)
Pharmacy notified of pt missing 1200 dose of iv abx
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
## 2018-08-16 NOTE — Progress Notes (Signed)
Pharmacy Antibiotic Note  Alexis Higgins is a 78 y.o. female admitted on 08/15/2018 with pneumonia.  Pharmacy has been consulted for Zosyn dosing.  Plan: Zosyn 3.375 gm IV q8hr Monitor renal function and C&S.  Height: 5\' 5"  (165.1 cm) Weight: 130 lb (59 kg) IBW/kg (Calculated) : 57  Temp (24hrs), Avg:98.5 F (36.9 C), Min:98.5 F (36.9 C), Max:98.5 F (36.9 C)  Recent Labs  Lab 08/15/18 1141 08/16/18 0835 08/16/18 1031  WBC 12.5* 14.0*  --   CREATININE 0.94  --  0.73    Estimated Creatinine Clearance: 53 mL/min (by C-G formula based on SCr of 0.73 mg/dL).    Allergies  Allergen Reactions  . Aspirin Other (See Comments)     cns bleed risk  . Codeine Anaphylaxis and Rash  . Stiolto Respimat [Tiotropium Bromide-Olodaterol] Other (See Comments)    Caused inability to breath/ severe reaction - reported in 2015    Antimicrobials this admission: Zosyn 3/25 >>  Thank you for allowing pharmacy to be a part of this patient's care.  Alanda Slim, PharmD, [facility] Clinical Pharmacist Please see AMION for all Pharmacists' Contact Phone Numbers 08/16/2018, 12:34 PM

## 2018-08-16 NOTE — ED Notes (Signed)
Per charge 5W, ok to send pt upstairs
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
## 2018-08-16 NOTE — Progress Notes (Signed)
Responded to PIV consult. Pt has multiple skin tears and bruising on both arms. Left arm inappropriate for PIV. Right arm with limited possible areas for PIV. Unsuccessful attempt x1. No other appropriate sites for PIV or Midline at this time. Spoke with .MD regarding above. Asked about possible route change for antibiotic. MD to review chart and call primary RN with any new decision.

## 2018-08-16 NOTE — ED Notes (Signed)
Pt gives permission for staff to talk to daughter Verita Lamb) [phone]

## 2018-08-17 DIAGNOSIS — T17908A Unspecified foreign body in respiratory tract, part unspecified causing other injury, initial encounter: Secondary | ICD-10-CM

## 2018-08-17 DIAGNOSIS — L8990 Pressure ulcer of unspecified site, unspecified stage: Secondary | ICD-10-CM

## 2018-08-17 DIAGNOSIS — S22000D Wedge compression fracture of unspecified thoracic vertebra, subsequent encounter for fracture with routine healing: Secondary | ICD-10-CM

## 2018-08-17 DIAGNOSIS — I2510 Atherosclerotic heart disease of native coronary artery without angina pectoris: Secondary | ICD-10-CM

## 2018-08-17 LAB — PROCALCITONIN: Procalcitonin: 0.4 ng/mL

## 2018-08-17 LAB — HIV ANTIBODY (ROUTINE TESTING W REFLEX): HIV Screen 4th Generation wRfx: NONREACTIVE

## 2018-08-17 MED ORDER — AMPICILLIN-SULBACTAM SODIUM 1.5 (1-0.5) G IJ SOLR
1.5000 g | Freq: Three times a day (TID) | INTRAMUSCULAR | Status: DC
  Filled 2018-08-17 (×2): qty 1.5

## 2018-08-17 MED ORDER — BOOST / RESOURCE BREEZE PO LIQD CUSTOM
1.0000 | Freq: Three times a day (TID) | ORAL | Status: DC
  Administered 2018-08-17 – 2018-08-22 (×9): 1 via ORAL

## 2018-08-17 MED ORDER — CLINDAMYCIN PALMITATE HCL 75 MG/5ML PO SOLR
150.0000 mg | Freq: Three times a day (TID) | ORAL | Status: DC
  Filled 2018-08-17: qty 10

## 2018-08-17 MED ORDER — AMPICILLIN-SULBACTAM SODIUM 1.5 (1-0.5) G IJ SOLR
1.5000 g | Freq: Once | INTRAMUSCULAR | Status: AC
  Administered 2018-08-17: 1.5 g via INTRAMUSCULAR
  Filled 2018-08-17: qty 1.5

## 2018-08-17 MED ORDER — TRAZODONE HCL 50 MG PO TABS
25.0000 mg | ORAL_TABLET | Freq: Every day | ORAL | Status: DC
  Administered 2018-08-17 – 2018-08-22 (×6): 25 mg via ORAL
  Filled 2018-08-17 (×8): qty 1

## 2018-08-17 MED ORDER — AMOXICILLIN-POT CLAVULANATE 500-125 MG PO TABS: 1.0000 | ORAL_TABLET | Freq: Three times a day (TID) | ORAL | Status: DC

## 2018-08-17 MED ORDER — AMOXICILLIN-POT CLAVULANATE 500-125 MG PO TABS
1.0000 | ORAL_TABLET | Freq: Two times a day (BID) | ORAL | Status: DC
  Administered 2018-08-17 – 2018-08-20 (×6): 500 mg via ORAL
  Filled 2018-08-17 (×6): qty 1
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
## 2018-08-17 NOTE — Progress Notes (Signed)
IV access loss. Pt has multiple skin tears and very sensitive skin. Skin tear caused while trying to gently remove tegaderm dressing and IV. NP and daughter, Elmyra Ricks, notified. Safety Zone completed. Pt deemed inappropriate for IV access due to skin tears and bruising. NP notified and switched abx to IM route. IM UNASYN given. Pt tolerated well.

## 2018-08-17 NOTE — Care Management CC44 (Signed)
Condition Code 44 Documentation Completed  Patient Details  Name: Alexis Higgins MRN: [id number] Date of Birth: August 12, 1940   Condition Code 44 given:  Yes Patient signature on Condition Code 44 notice:  Yes Documentation of 2 MD's agreement:  Yes Code 44 added to claim:  Yes    Sharin Mons, RN 08/17/2018, 11:44 AM

## 2018-08-17 NOTE — Care Management Obs Status (Signed)
[place] [facility] NOTIFICATION   Patient Details  Name: Alexis Higgins MRN: [id number] Date of Birth: 1940-09-14   Medicare Observation Status Notification Given:  Yes    Sharin Mons, RN 08/17/2018, 11:44 AM
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
## 2018-08-17 NOTE — Progress Notes (Signed)
VAST RN consulted for PIV placement. Left arm inappropriate for PIV. Right arm covered in bruising and large skin tears leaving no appropriate place for PIV. Notified unit RN, [place]. She verbalized understanding.
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
## 2018-08-18 DIAGNOSIS — Z7189 Other specified counseling: Secondary | ICD-10-CM

## 2018-08-18 DIAGNOSIS — G8921 Chronic pain due to trauma: Secondary | ICD-10-CM | POA: Diagnosis not present

## 2018-08-18 DIAGNOSIS — Z515 Encounter for palliative care: Secondary | ICD-10-CM

## 2018-08-18 DIAGNOSIS — T17908A Unspecified foreign body in respiratory tract, part unspecified causing other injury, initial encounter: Secondary | ICD-10-CM | POA: Diagnosis not present

## 2018-08-18 DIAGNOSIS — G894 Chronic pain syndrome: Secondary | ICD-10-CM

## 2018-08-18 LAB — GLUCOSE, CAPILLARY
Glucose-Capillary: 122 mg/dL — ABNORMAL HIGH (ref 70–99)
Glucose-Capillary: 191 mg/dL — ABNORMAL HIGH (ref 70–99)

## 2018-08-18 MED ORDER — ACETAMINOPHEN 325 MG PO TABS: 650.0000 mg | ORAL_TABLET | Freq: Four times a day (QID) | ORAL | Status: DC | PRN

## 2018-08-18 NOTE — TOC Progression Note (Signed)
Transition of Care [facility]) - Progression Note    Patient Details  Name: Alexis Higgins MRN: [id number] Date of Birth: Sep 10, 1940  Transition of Care [facility]) CM/SW [place], LCSW Phone Number: 08/18/2018, 3:23 PM  Clinical Narrative:    CSW was hoping to place patient under Medicare waiver that would restart SNF days however, [employer] is not participating in that. CSW will alert patient's daughter that we will need to find an LOG bed since they are unable to pay privately for SNF.   Expected Discharge Plan: Skilled Nursing Facility Barriers to Discharge: SNF Pending bed offer, Insurance Authorization  Expected Discharge Plan and Services Expected Discharge Plan: [place]   Discharge Planning Services: Other - See comment(SNF stay)                           [place] Agency: [place]   Readmission Risk Interventions No flowsheet data found.
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
## 2018-08-18 NOTE — Progress Notes (Signed)
Pt had minimal output throughout shift. Pt bladde scan revealed 163 ml. Will notify day shift RN in hand off.

## 2018-08-18 NOTE — Plan of Care (Signed)
  Problem: Clinical Measurements: Goal: Respiratory complications will improve Outcome: Progressing   Problem: Activity: Goal: Risk for activity intolerance will decrease Outcome: Progressing   Problem: Coping: Goal: Level of anxiety will decrease Outcome: Progressing   Problem: Pain Managment: Goal: General experience of comfort will improve Outcome: Progressing   Problem: Safety: Goal: Ability to remain free from injury will improve Outcome: Progressing   
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
## 2018-08-18 NOTE — Plan of Care (Signed)
  Problem: Education: Goal: Knowledge of General Education information will improve Description: Including pain rating scale, medication(s)/side effects and non-pharmacologic comfort measures Outcome: Progressing   Problem: Activity: Goal: Risk for activity intolerance will decrease Outcome: Progressing   Problem: Coping: Goal: Level of anxiety will decrease Outcome: Progressing   
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
## 2018-08-18 NOTE — Consult Note (Signed)
   [facility] CM Inpatient Consult   08/18/2018  Alexis Higgins 1941-02-28 [id number]    Patient screened for potential [facility] Care Management services due to unplanned readmission and risk score of 35% (extreme) and multiple hospitalizations. Has [employer] Medicare plan. Patient had previous [facility] services but not currently active. Chart reviewed and noted that expected discharge plan per transition of care social worker is to skilled nursing facility. No identifiable [facility] Care Management needs at this point.   If disposition plans change, please consider [facility] Care Management consult.  For questions, please contact:  Edwena Felty A. Azula Zappia, BSN, RN-BC [facility] Liaison Cell: [phone]

## 2018-08-18 NOTE — Progress Notes (Addendum)
On skin assessment - patient has generalized bruising and multiple skin tears on BLE and BUE. Also, patient has a blister on right hand, closed. Left heel - DTI and right heel red and blanchable; stg 2 on sacrum and left hip. Patient is wearing prevaloon boots BLE. All dressings were changed this AM. Patient tolerated well. Will continue to monitor.

## 2018-08-19 LAB — CBC WITH DIFFERENTIAL/PLATELET
Abs Immature Granulocytes: 0.21 10*3/uL — ABNORMAL HIGH (ref 0.00–0.07)
BASOS ABS: 0 10*3/uL (ref 0.0–0.1)
Basophils Relative: 0 %
Eosinophils Absolute: 0 10*3/uL (ref 0.0–0.5)
Eosinophils Relative: 0 %
HCT: 38.4 % (ref 36.0–46.0)
Hemoglobin: 12.4 g/dL (ref 12.0–15.0)
IMMATURE GRANULOCYTES: 1 %
Lymphocytes Relative: 11 %
Lymphs Abs: 2 10*3/uL (ref 0.7–4.0)
MCH: 28.8 pg (ref 26.0–34.0)
MCHC: 32.3 g/dL (ref 30.0–36.0)
MCV: 89.3 fL (ref 80.0–100.0)
Monocytes Absolute: 1.4 10*3/uL — ABNORMAL HIGH (ref 0.1–1.0)
Monocytes Relative: 8 %
NRBC: 0.1 % (ref 0.0–0.2)
Neutro Abs: 14.7 10*3/uL — ABNORMAL HIGH (ref 1.7–7.7)
Neutrophils Relative %: 80 %
Platelets: 300 10*3/uL (ref 150–400)
RBC: 4.3 MIL/uL (ref 3.87–5.11)
RDW: 14.7 % (ref 11.5–15.5)
WBC: 18.4 10*3/uL — AB (ref 4.0–10.5)

## 2018-08-19 LAB — BASIC METABOLIC PANEL
Anion gap: 13 (ref 5–15)
BUN: 17 mg/dL (ref 8–23)
CO2: 35 mmol/L — ABNORMAL HIGH (ref 22–32)
Calcium: 8.8 mg/dL — ABNORMAL LOW (ref 8.9–10.3)
Chloride: 96 mmol/L — ABNORMAL LOW (ref 98–111)
Creatinine, Ser: 1.13 mg/dL — ABNORMAL HIGH (ref 0.44–1.00)
GFR calc Af Amer: 54 mL/min — ABNORMAL LOW (ref >60)
GFR calc non Af Amer: 47 mL/min — ABNORMAL LOW (ref >60)
Glucose, Bld: 149 mg/dL — ABNORMAL HIGH (ref 70–99)
Potassium: 3.1 mmol/L — ABNORMAL LOW (ref 3.5–5.1)
Sodium: 144 mmol/L (ref 135–145)
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
## 2018-08-20 DIAGNOSIS — J9621 Acute and chronic respiratory failure with hypoxia: Secondary | ICD-10-CM | POA: Diagnosis present

## 2018-08-20 DIAGNOSIS — E860 Dehydration: Secondary | ICD-10-CM | POA: Diagnosis present

## 2018-08-20 DIAGNOSIS — I2510 Atherosclerotic heart disease of native coronary artery without angina pectoris: Secondary | ICD-10-CM | POA: Diagnosis present

## 2018-08-20 DIAGNOSIS — J9601 Acute respiratory failure with hypoxia: Secondary | ICD-10-CM

## 2018-08-20 DIAGNOSIS — F22 Delusional disorders: Secondary | ICD-10-CM | POA: Diagnosis present

## 2018-08-20 DIAGNOSIS — Z7189 Other specified counseling: Secondary | ICD-10-CM | POA: Diagnosis not present

## 2018-08-20 DIAGNOSIS — W19XXXA Unspecified fall, initial encounter: Secondary | ICD-10-CM | POA: Diagnosis not present

## 2018-08-20 DIAGNOSIS — Z515 Encounter for palliative care: Secondary | ICD-10-CM | POA: Diagnosis not present

## 2018-08-20 DIAGNOSIS — R64 Cachexia: Secondary | ICD-10-CM | POA: Diagnosis present

## 2018-08-20 DIAGNOSIS — F319 Bipolar disorder, unspecified: Secondary | ICD-10-CM | POA: Diagnosis present

## 2018-08-20 DIAGNOSIS — L89152 Pressure ulcer of sacral region, stage 2: Secondary | ICD-10-CM | POA: Diagnosis present

## 2018-08-20 DIAGNOSIS — E43 Unspecified severe protein-calorie malnutrition: Secondary | ICD-10-CM | POA: Diagnosis present

## 2018-08-20 DIAGNOSIS — Z6821 Body mass index (BMI) 21.0-21.9, adult: Secondary | ICD-10-CM | POA: Diagnosis not present

## 2018-08-20 DIAGNOSIS — Z7401 Bed confinement status: Secondary | ICD-10-CM | POA: Diagnosis not present

## 2018-08-20 DIAGNOSIS — M4854XA Collapsed vertebra, not elsewhere classified, thoracic region, initial encounter for fracture: Secondary | ICD-10-CM | POA: Diagnosis present

## 2018-08-20 DIAGNOSIS — I10 Essential (primary) hypertension: Secondary | ICD-10-CM | POA: Diagnosis not present

## 2018-08-20 DIAGNOSIS — I110 Hypertensive heart disease with heart failure: Secondary | ICD-10-CM | POA: Diagnosis present

## 2018-08-20 DIAGNOSIS — M810 Age-related osteoporosis without current pathological fracture: Secondary | ICD-10-CM | POA: Diagnosis present

## 2018-08-20 DIAGNOSIS — I5032 Chronic diastolic (congestive) heart failure: Secondary | ICD-10-CM | POA: Diagnosis present

## 2018-08-20 DIAGNOSIS — R5381 Other malaise: Secondary | ICD-10-CM | POA: Diagnosis not present

## 2018-08-20 DIAGNOSIS — R54 Age-related physical debility: Secondary | ICD-10-CM | POA: Diagnosis present

## 2018-08-20 DIAGNOSIS — J440 Chronic obstructive pulmonary disease with acute lower respiratory infection: Secondary | ICD-10-CM | POA: Diagnosis not present

## 2018-08-20 DIAGNOSIS — J189 Pneumonia, unspecified organism: Secondary | ICD-10-CM | POA: Diagnosis not present

## 2018-08-20 DIAGNOSIS — Z23 Encounter for immunization: Secondary | ICD-10-CM | POA: Diagnosis not present

## 2018-08-20 DIAGNOSIS — E876 Hypokalemia: Secondary | ICD-10-CM | POA: Diagnosis present

## 2018-08-20 DIAGNOSIS — Z66 Do not resuscitate: Secondary | ICD-10-CM | POA: Diagnosis not present

## 2018-08-20 DIAGNOSIS — R0602 Shortness of breath: Secondary | ICD-10-CM | POA: Diagnosis not present

## 2018-08-20 DIAGNOSIS — I739 Peripheral vascular disease, unspecified: Secondary | ICD-10-CM | POA: Diagnosis present

## 2018-08-20 DIAGNOSIS — M2550 Pain in unspecified joint: Secondary | ICD-10-CM | POA: Diagnosis not present

## 2018-08-20 DIAGNOSIS — J9811 Atelectasis: Secondary | ICD-10-CM | POA: Diagnosis present

## 2018-08-20 DIAGNOSIS — J449 Chronic obstructive pulmonary disease, unspecified: Secondary | ICD-10-CM | POA: Diagnosis present

## 2018-08-20 DIAGNOSIS — N179 Acute kidney failure, unspecified: Secondary | ICD-10-CM | POA: Diagnosis present

## 2018-08-20 LAB — CBC WITH DIFFERENTIAL/PLATELET
Abs Immature Granulocytes: 0.32 10*3/uL — ABNORMAL HIGH (ref 0.00–0.07)
Basophils Absolute: 0.1 10*3/uL (ref 0.0–0.1)
Basophils Relative: 0 %
Eosinophils Absolute: 0 10*3/uL (ref 0.0–0.5)
Eosinophils Relative: 0 %
HCT: 37.7 % (ref 36.0–46.0)
Hemoglobin: 11.9 g/dL — ABNORMAL LOW (ref 12.0–15.0)
Immature Granulocytes: 2 %
LYMPHS PCT: 12 %
Lymphs Abs: 2.3 10*3/uL (ref 0.7–4.0)
MCH: 28 pg (ref 26.0–34.0)
MCHC: 31.6 g/dL (ref 30.0–36.0)
MCV: 88.7 fL (ref 80.0–100.0)
Monocytes Absolute: 1.7 10*3/uL — ABNORMAL HIGH (ref 0.1–1.0)
Monocytes Relative: 9 %
Neutro Abs: 15.4 10*3/uL — ABNORMAL HIGH (ref 1.7–7.7)
Neutrophils Relative %: 77 %
Platelets: 317 10*3/uL (ref 150–400)
RBC: 4.25 MIL/uL (ref 3.87–5.11)
RDW: 14.6 % (ref 11.5–15.5)
WBC: 19.8 10*3/uL — ABNORMAL HIGH (ref 4.0–10.5)
nRBC: 0.2 % (ref 0.0–0.2)

## 2018-08-20 LAB — MAGNESIUM: Magnesium: 2.1 mg/dL (ref 1.7–2.4)

## 2018-08-20 LAB — POTASSIUM: Potassium: 3.7 mmol/L (ref 3.5–5.1)

## 2018-08-20 LAB — BASIC METABOLIC PANEL
Anion gap: 17 — ABNORMAL HIGH (ref 5–15)
BUN: 20 mg/dL (ref 8–23)
CHLORIDE: 96 mmol/L — AB (ref 98–111)
CO2: 33 mmol/L — ABNORMAL HIGH (ref 22–32)
Calcium: 8.9 mg/dL (ref 8.9–10.3)
Creatinine, Ser: 1.03 mg/dL — ABNORMAL HIGH (ref 0.44–1.00)
GFR calc Af Amer: >60 mL/min (ref >60)
GFR calc non Af Amer: 52 mL/min — ABNORMAL LOW (ref >60)
Glucose, Bld: 118 mg/dL — ABNORMAL HIGH (ref 70–99)
Potassium: 2.4 mmol/L — CL (ref 3.5–5.1)
SODIUM: 146 mmol/L — AB (ref 135–145)

## 2018-08-20 MED ORDER — DILTIAZEM HCL 25 MG/5ML IV SOLN
10.0000 mg | Freq: Once | INTRAVENOUS | Status: AC
  Administered 2018-08-20: 10 mg via INTRAVENOUS
  Filled 2018-08-20: qty 5

## 2018-08-20 MED ORDER — MAGNESIUM SULFATE IN D5W 1-5 GM/100ML-% IV SOLN
1.0000 g | Freq: Once | INTRAVENOUS | Status: AC
  Administered 2018-08-20: 1 g via INTRAVENOUS
  Filled 2018-08-20: qty 100

## 2018-08-20 MED ORDER — POTASSIUM CHLORIDE CRYS ER 20 MEQ PO TBCR
40.0000 meq | EXTENDED_RELEASE_TABLET | Freq: Once | ORAL | Status: AC
  Administered 2018-08-20: 40 meq via ORAL
  Filled 2018-08-20: qty 2

## 2018-08-20 MED ORDER — METOPROLOL TARTRATE 5 MG/5ML IV SOLN: 5.0000 mg | Freq: Four times a day (QID) | INTRAVENOUS | Status: DC | PRN

## 2018-08-20 MED ORDER — POTASSIUM CHLORIDE 10 MEQ/100ML IV SOLN
10.0000 meq | INTRAVENOUS | Status: AC
  Administered 2018-08-20 (×4): 10 meq via INTRAVENOUS
  Filled 2018-08-20 (×4): qty 100

## 2018-08-20 MED ORDER — METOPROLOL TARTRATE 100 MG PO TABS
100.0000 mg | ORAL_TABLET | Freq: Two times a day (BID) | ORAL | Status: DC
  Administered 2018-08-20 – 2018-08-23 (×6): 100 mg via ORAL
  Filled 2018-08-20 (×9): qty 1

## 2018-08-20 MED ORDER — AMOXICILLIN-POT CLAVULANATE 500-125 MG PO TABS
1.0000 | ORAL_TABLET | Freq: Two times a day (BID) | ORAL | Status: DC
  Administered 2018-08-20 – 2018-08-23 (×6): 500 mg via ORAL
  Filled 2018-08-20 (×8): qty 1

## 2018-08-20 MED ORDER — MORPHINE SULFATE (PF) 2 MG/ML IV SOLN
2.0000 mg | Freq: Once | INTRAVENOUS | Status: AC
  Administered 2018-08-20: 2 mg via INTRAVENOUS
  Filled 2018-08-20: qty 1
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
## 2018-08-21 ENCOUNTER — Inpatient Hospital Stay (HOSPITAL_COMMUNITY): Payer: Medicare Other

## 2018-08-21 LAB — BASIC METABOLIC PANEL
Anion gap: 10 (ref 5–15)
BUN: 25 mg/dL — ABNORMAL HIGH (ref 8–23)
CALCIUM: 8.5 mg/dL — AB (ref 8.9–10.3)
CO2: 34 mmol/L — ABNORMAL HIGH (ref 22–32)
CREATININE: 0.88 mg/dL (ref 0.44–1.00)
Chloride: 100 mmol/L (ref 98–111)
GFR calc Af Amer: >60 mL/min (ref >60)
GFR calc non Af Amer: >60 mL/min (ref >60)
Glucose, Bld: 116 mg/dL — ABNORMAL HIGH (ref 70–99)
Potassium: 3.5 mmol/L (ref 3.5–5.1)
Sodium: 144 mmol/L (ref 135–145)

## 2018-08-21 LAB — CBC
HCT: 36.7 % (ref 36.0–46.0)
Hemoglobin: 11.6 g/dL — ABNORMAL LOW (ref 12.0–15.0)
MCH: 29.3 pg (ref 26.0–34.0)
MCHC: 31.6 g/dL (ref 30.0–36.0)
MCV: 92.7 fL (ref 80.0–100.0)
Platelets: 274 10*3/uL (ref 150–400)
RBC: 3.96 MIL/uL (ref 3.87–5.11)
RDW: 14.9 % (ref 11.5–15.5)
WBC: 15.2 10*3/uL — ABNORMAL HIGH (ref 4.0–10.5)
nRBC: 0.2 % (ref 0.0–0.2)

## 2018-08-21 LAB — CULTURE, BLOOD (ROUTINE X 2)
Culture: NO GROWTH
Culture: NO GROWTH
Special Requests: ADEQUATE

## 2018-08-21 MED ORDER — KCL-LACTATED RINGERS-D5W 20 MEQ/L IV SOLN
INTRAVENOUS | Status: DC
  Administered 2018-08-21: 11:00:00 via INTRAVENOUS
  Filled 2018-08-21: qty 1000

## 2018-08-21 NOTE — Progress Notes (Signed)
PT Cancellation Note  Patient Details Name: Alexis Higgins MRN: [id number] DOB: 1940/08/12   Cancelled Treatment:    Reason Eval/Treat Not Completed: (P) Patient's level of consciousness Pt worked with OT, got up to chair, nursing transferred her back to bed and pt currently sound asleep, not responding to name, or gentle pertubation. PT will follow back tomorrow for treatment.  Kortne All B. Migdalia Dk PT, DPT Acute Rehabilitation Services Pager [phone] Office [phone]  [place] 08/21/2018, 4:46 PM
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
## 2018-08-22 DIAGNOSIS — R5381 Other malaise: Secondary | ICD-10-CM

## 2018-08-22 LAB — CBC
HEMATOCRIT: 35.8 % — AB (ref 36.0–46.0)
HEMOGLOBIN: 11.1 g/dL — AB (ref 12.0–15.0)
MCH: 29.3 pg (ref 26.0–34.0)
MCHC: 31 g/dL (ref 30.0–36.0)
MCV: 94.5 fL (ref 80.0–100.0)
Platelets: 233 10*3/uL (ref 150–400)
RBC: 3.79 MIL/uL — ABNORMAL LOW (ref 3.87–5.11)
RDW: 15.3 % (ref 11.5–15.5)
WBC: 10.8 10*3/uL — ABNORMAL HIGH (ref 4.0–10.5)
nRBC: 0.2 % (ref 0.0–0.2)

## 2018-08-22 LAB — BASIC METABOLIC PANEL
Anion gap: 9 (ref 5–15)
BUN: 25 mg/dL — ABNORMAL HIGH (ref 8–23)
CHLORIDE: 101 mmol/L (ref 98–111)
CO2: 38 mmol/L — AB (ref 22–32)
Calcium: 8.8 mg/dL — ABNORMAL LOW (ref 8.9–10.3)
Creatinine, Ser: 0.71 mg/dL (ref 0.44–1.00)
GFR calc Af Amer: >60 mL/min (ref >60)
GFR calc non Af Amer: >60 mL/min (ref >60)
Glucose, Bld: 110 mg/dL — ABNORMAL HIGH (ref 70–99)
Potassium: 4 mmol/L (ref 3.5–5.1)
Sodium: 148 mmol/L — ABNORMAL HIGH (ref 135–145)

## 2018-08-22 MED ORDER — DILTIAZEM HCL 60 MG PO TABS
60.0000 mg | ORAL_TABLET | Freq: Two times a day (BID) | ORAL | Status: DC
  Administered 2018-08-22 – 2018-08-23 (×3): 60 mg via ORAL
  Filled 2018-08-22 (×5): qty 1

## 2018-08-22 MED ORDER — KCL-LACTATED RINGERS-D5W 20 MEQ/L IV SOLN: INTRAVENOUS | Status: DC

## 2018-08-22 MED ORDER — POTASSIUM CL IN DEXTROSE 5% 20 MEQ/L IV SOLN
20.0000 meq | INTRAVENOUS | Status: DC
  Administered 2018-08-22 – 2018-08-23 (×2): 20 meq via INTRAVENOUS
  Filled 2018-08-22 (×2): qty 1000

## 2018-08-22 NOTE — Progress Notes (Signed)
   08/22/18 1200  What Happened  Was fall witnessed? No  Was patient injured? No  Patient found on floor  Found by Staff-comment  Stated prior activity other (comment) (lying in bed )  Follow Up  MD notified Candiss Norse  Time MD notified 1210  Family notified Yes-comment (daughter Elmyra Ricks )  Time family notified 1251  Additional tests No  Simple treatment Other (comment) (patient needed no treatment )  Progress note created (see row info) Yes   Patient found on floor, laying on right side. Found by NT reporting to bed alarm shortly after alarming.  MD aware will continue to monitor.

## 2018-08-22 NOTE — Progress Notes (Signed)
Bladder scan completed. 384 mL noted within bladder. MD notified. RN waiting for new orders.-
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
## 2018-08-22 NOTE — Progress Notes (Signed)
Arrived @ bedside to place PIV per consult. Patient skin is very friable with numerous protective skin dressings on both extremities. Unable to locate site feasible for PIV placement.. Discussed with primary care RN. Recommend MD assessment and possible line placement exclusive of extremities.
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
## 2018-08-23 LAB — BASIC METABOLIC PANEL
Anion gap: 10 (ref 5–15)
BUN: 24 mg/dL — ABNORMAL HIGH (ref 8–23)
CO2: 35 mmol/L — AB (ref 22–32)
Calcium: 8.3 mg/dL — ABNORMAL LOW (ref 8.9–10.3)
Chloride: 97 mmol/L — ABNORMAL LOW (ref 98–111)
Creatinine, Ser: 0.83 mg/dL (ref 0.44–1.00)
GFR calc non Af Amer: >60 mL/min (ref >60)
Glucose, Bld: 138 mg/dL — ABNORMAL HIGH (ref 70–99)
Potassium: 4.3 mmol/L (ref 3.5–5.1)
Sodium: 142 mmol/L (ref 135–145)
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
## 2018-08-23 NOTE — Care Management Important Message (Signed)
Important Message  Patient Details  Name: Alexis Higgins MRN: [id number] Date of Birth: Apr 06, 1941   Medicare Important Message Given:  Yes    Orbie Pyo 08/23/2018, 1:44 PM
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
## 2018-08-23 NOTE — Progress Notes (Signed)
Dressings on right leg and BUE were changed. New foam dressing applied on right ankle. Will continue to monitor.

## 2018-08-23 NOTE — Progress Notes (Signed)
Poor po intake today. Patient has difficulty taking her medicine. Tonight she refused all her meds. She was able to take few sips of orange juice. Will continue to monitor.

## 2018-08-24 DIAGNOSIS — Z23 Encounter for immunization: Secondary | ICD-10-CM | POA: Diagnosis not present

## 2018-08-24 LAB — BASIC METABOLIC PANEL
Anion gap: 10 (ref 5–15)
BUN: 21 mg/dL (ref 8–23)
CO2: 34 mmol/L — ABNORMAL HIGH (ref 22–32)
Calcium: 8.5 mg/dL — ABNORMAL LOW (ref 8.9–10.3)
Chloride: 97 mmol/L — ABNORMAL LOW (ref 98–111)
Creatinine, Ser: 0.78 mg/dL (ref 0.44–1.00)
GFR calc Af Amer: >60 mL/min (ref >60)
GFR calc non Af Amer: >60 mL/min (ref >60)
Glucose, Bld: 89 mg/dL (ref 70–99)
Potassium: 3.9 mmol/L (ref 3.5–5.1)
Sodium: 141 mmol/L (ref 135–145)

## 2018-08-24 MED ORDER — LORAZEPAM 2 MG/ML PO CONC: 1.0000 mg | Freq: Four times a day (QID) | ORAL | 0 refills | Status: AC | PRN

## 2018-08-24 MED ORDER — MORPHINE SULFATE (CONCENTRATE) 10 MG/0.5ML PO SOLN: 10.0000 mg | ORAL | 0 refills | Status: AC | PRN

## 2018-08-24 NOTE — TOC Progression Note (Signed)
Transition of Care [facility]) - Progression Note    Patient Details  Name: CHINA DEITRICK MRN: [id number] Date of Birth: 01/21/1941  Transition of Care [facility]) CM/SW Contact  Sharin Mons, RN Phone [phone] 08/24/2018, 9:30 AM  Clinical Narrative:    NCM spoke with [facility] Mcgregor liaison regarding residential hospice placement. Audrea Muscat to f/u with daughter, facility [facility]) for placement.     Expected Discharge Plan: [place] Barriers to Discharge: SNF Pending bed offer, Insurance Authorization  Expected Discharge Plan and Services Expected Discharge Plan: [place]   Discharge Planning Services: CM Consult     Expected Discharge Date: 08/24/18                        Social Determinants of Health (SDOH) Interventions    Readmission Risk Interventions No flowsheet data found.

## 2018-08-24 NOTE — Progress Notes (Signed)
Patient was discharged to residential hospice [facility]) by MD order; discharged instructions review and sent to facility via PTAR with care notes; IV DIC; dressings were changed before D/C (BUE; right leg, right upper back and shoulder; left hip and sacrum); report was given to the nurse who is going to receive the patient; patient's daughter Elmyra Ricks aware about D/C; patient will be transported to facility via [place].
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
## 2018-08-24 NOTE — Progress Notes (Signed)
Patient more lethargic this AM. She refused medicine and her breakfast. MD made aware and also, her daughter Elmyra Ricks is aware about patient's condition. Will continue to monitor.

## 2018-08-24 NOTE — Progress Notes (Signed)
Nutrition Follow Up/Brief Note  Chart reviewed. Pt now transitioning to comfort care.  No further nutrition interventions warranted at this time.  Please consult as needed.   Katie Denis Koppel, RD, LDN Pager #: [phone] After-Hours Pager #: [phone]    

## 2018-08-24 NOTE — Progress Notes (Signed)
Manufacturing engineer [facility]) Hospital Liaison note.   Received request from Whitman Hero, RN CM for family interest in [facility] with request for transfer. Chart reviewed and eligibility confirmed. Met with family to confirm interest and explain services. Family agreeable to transfer today.RN  CM aware.  Registration paper work completed. Dr. Orpah Melter to assume care per family request.  Please fax discharge summary to [phone]. RN please call report to [phone]. Please arrange transport for patient.    Thank you,   Thank you.   Farrel Gordon, RN, [facility]   [facility] Liaison   [phone]     [place] are on AMION

## 2018-08-24 NOTE — Discharge Instructions (Signed)
Disposition.  Residential hospice °Condition.  Guarded °CODE STATUS.  DNR °Activity.  With assistance as tolerated, full fall precautions. °Diet.  Soft with feeding assistance and aspiration precautions. °Goal of care.  Comfort. ° °

## 2018-08-24 NOTE — TOC Transition Note (Addendum)
Transition of Care [facility]) - CM/SW Discharge Note   Patient Details  Name: Alexis Higgins MRN: [id number] Date of Birth: 1941-02-08  Transition of Care [facility]) CM/SW Contact:  Sharin Mons, RN Phone Number: 08/24/2018, 11:21 AM   Clinical Narrative:    D/C summary faxed to [facility] @ [phone]. Bedside nurse to call and give report to receiving nurse @ [phone]. [facility] to provide transportation to [employer].  Final next level of care: [place] Barriers to discharge: pt d/c to residential hospice   Patient Goals and CMS Choice Patient states their goals for this hospitalization and ongoing recovery are:: (Short or long-term SNF stay )   Choice offered to / list presented to : Patient  Discharge Placement                       Discharge Plan and Services   Discharge Planning Services: CM Consult                     Social Determinants of Health (SDOH) Interventions     Readmission Risk Interventions No flowsheet data found.

## 2018-08-25 ENCOUNTER — Telehealth: Payer: Self-pay | Admitting: *Deleted
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
## 2018-08-27 ENCOUNTER — Encounter: Payer: Self-pay | Admitting: Internal Medicine

## 2018-08-27 ENCOUNTER — Encounter (INDEPENDENT_AMBULATORY_CARE_PROVIDER_SITE_OTHER): Payer: Self-pay | Admitting: Orthopaedic Surgery

## 2018-08-27 ENCOUNTER — Encounter: Payer: Self-pay | Admitting: Podiatry

## 2018-08-28 NOTE — Telephone Encounter (Signed)
Condolence card given to [place] NP Will sign off

## 2018-08-28 NOTE — Telephone Encounter (Signed)
Sorry to hear that - Please send condolence card 

## 2018-08-28 NOTE — Telephone Encounter (Signed)
Message received this morning.  [name] passed away @ 2:20pm August 31, 2018.   Message routed to Dr Lamonte Sakai and Lynelle Smoke P

## 2018-08-28 NOTE — Telephone Encounter (Signed)
Sorry to hear this  Can we send condolenense card

## 2018-08-28 NOTE — Telephone Encounter (Signed)
Sorry to hear this.  Thank you for the message

## 2018-08-29 NOTE — Telephone Encounter (Signed)
Do we have cards here in the office?

## 2018-09-10 DIAGNOSIS — I2510 Atherosclerotic heart disease of native coronary artery without angina pectoris: Secondary | ICD-10-CM | POA: Diagnosis not present

## 2018-09-10 DIAGNOSIS — R0689 Other abnormalities of breathing: Secondary | ICD-10-CM | POA: Diagnosis not present

## 2018-09-10 DIAGNOSIS — R0902 Hypoxemia: Secondary | ICD-10-CM | POA: Diagnosis not present

## 2018-09-10 DIAGNOSIS — J449 Chronic obstructive pulmonary disease, unspecified: Secondary | ICD-10-CM | POA: Diagnosis not present

## 2018-09-14 DIAGNOSIS — J449 Chronic obstructive pulmonary disease, unspecified: Secondary | ICD-10-CM | POA: Diagnosis not present

## 2018-09-14 DIAGNOSIS — J9610 Chronic respiratory failure, unspecified whether with hypoxia or hypercapnia: Secondary | ICD-10-CM | POA: Diagnosis not present

## 2018-09-14 DIAGNOSIS — R0689 Other abnormalities of breathing: Secondary | ICD-10-CM | POA: Diagnosis not present

## 2018-09-14 DIAGNOSIS — R0902 Hypoxemia: Secondary | ICD-10-CM | POA: Diagnosis not present

## 2018-09-22 DEATH — deceased

## 2019-05-24 IMAGING — CT CT RENAL STONE PROTOCOL
2 of 4 series · 16 of 46 positions shown, 18 images · non-contrast
Comparison: 06/21/2017

CLINICAL DATA: Flank pain

EXAM:
CT ABDOMEN AND PELVIS WITHOUT CONTRAST
TECHNIQUE: Multidetector CT imaging of the abdomen and pelvis was performed
following the standard protocol without IV contrast.

[Series 3: ap without · axial · non-contrast · 0.78mm/px · z∈[+818,+1183]mm · 13 of 83 slices shown, 15 images]
[im 5/83  soft-tissue]
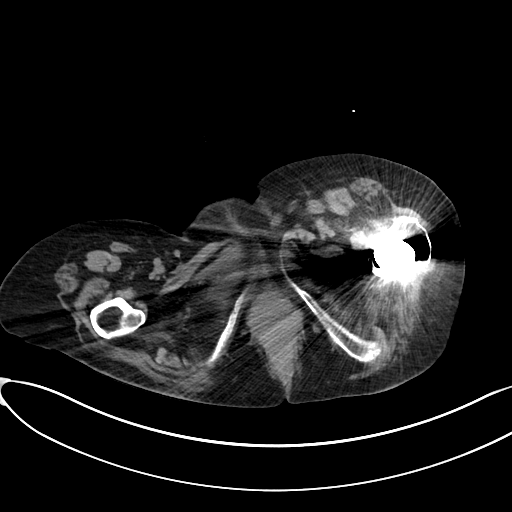
[im 5/83  bone]
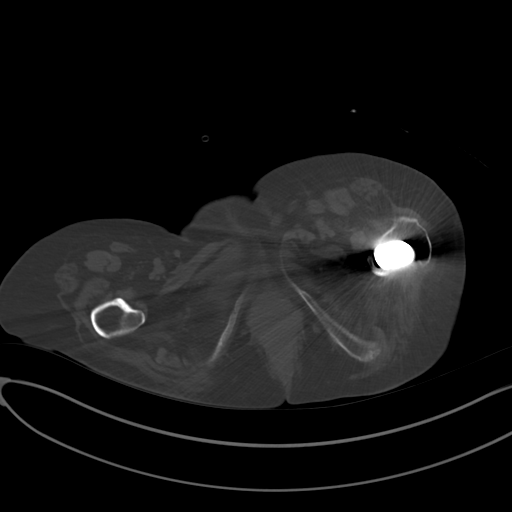
[im 14/83  soft-tissue]
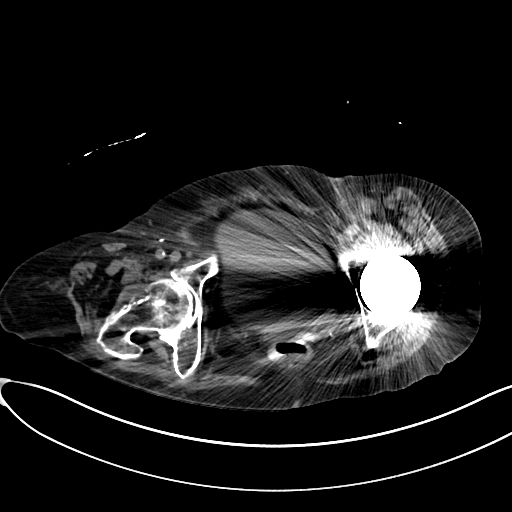
[im 23/83  soft-tissue]
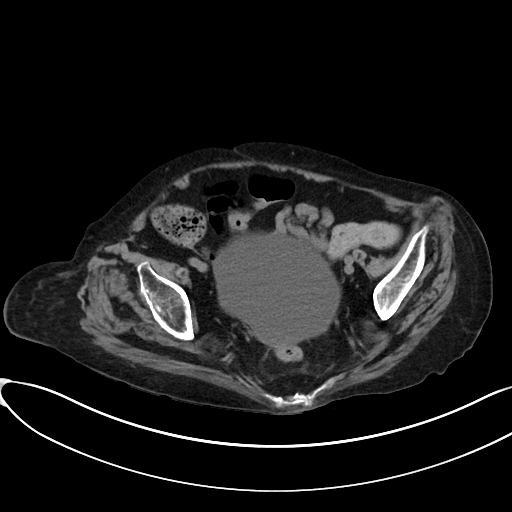
[im 28/83  soft-tissue]
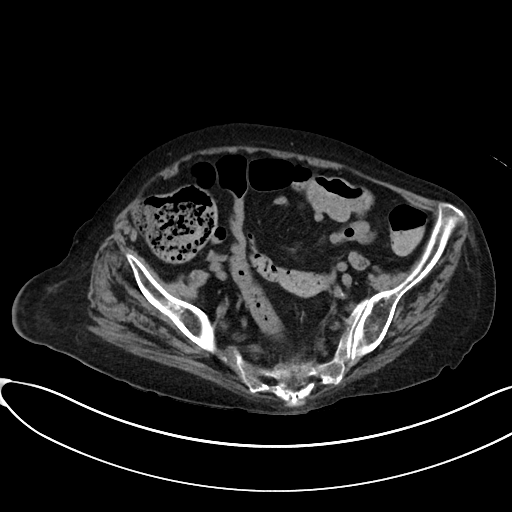
[im 32/83  soft-tissue]
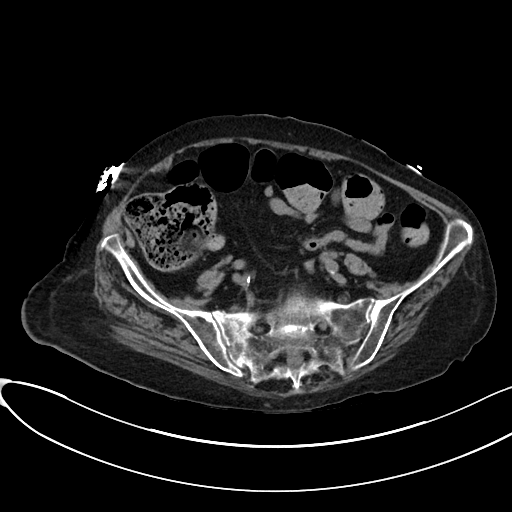
[im 37/83  soft-tissue]
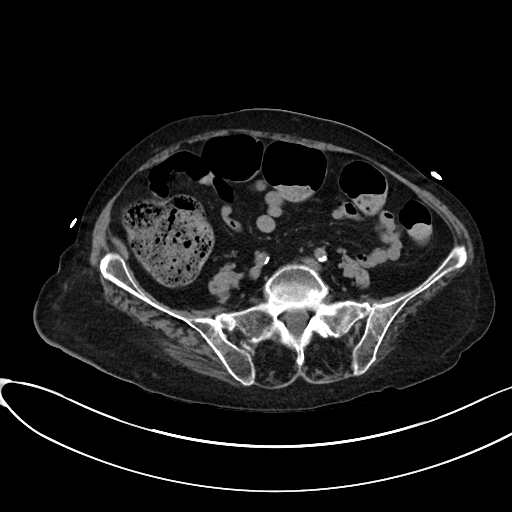
[im 46/83  soft-tissue]
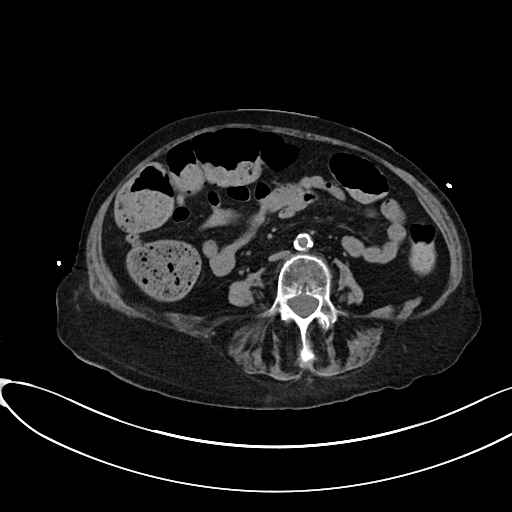
[im 51/83  soft-tissue]
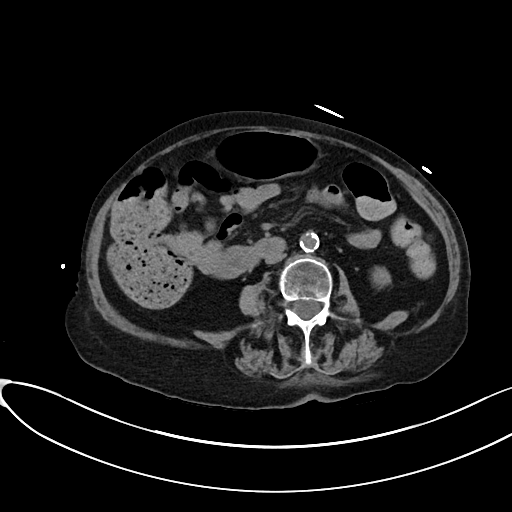
[im 55/83  soft-tissue]
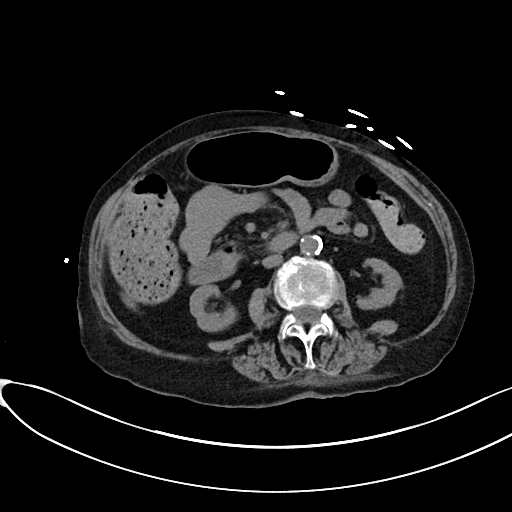
[im 55/83  bone]
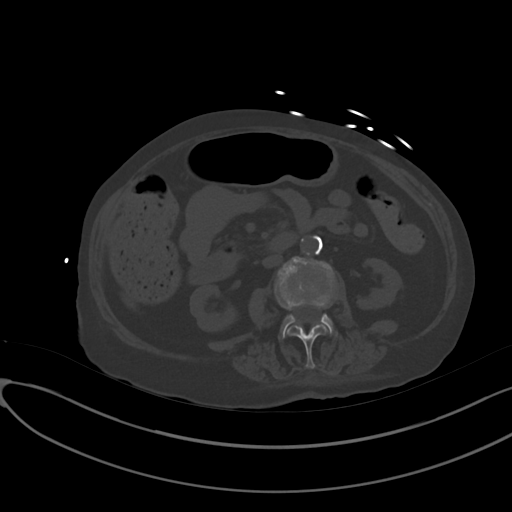
[im 60/83  soft-tissue]
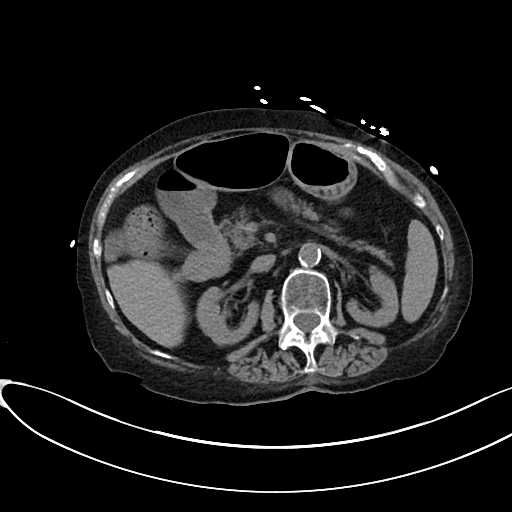
[im 64/83  soft-tissue]
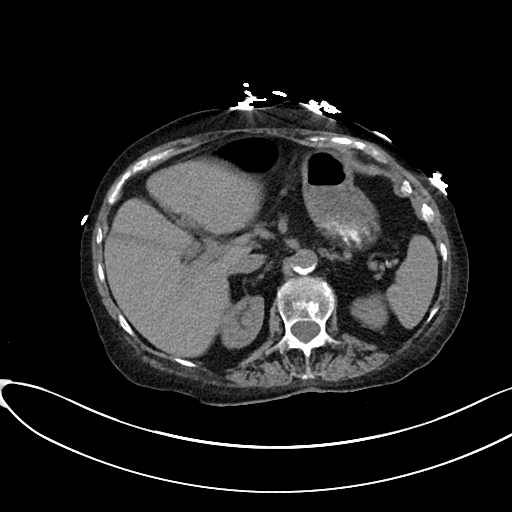
[im 73/83  soft-tissue]
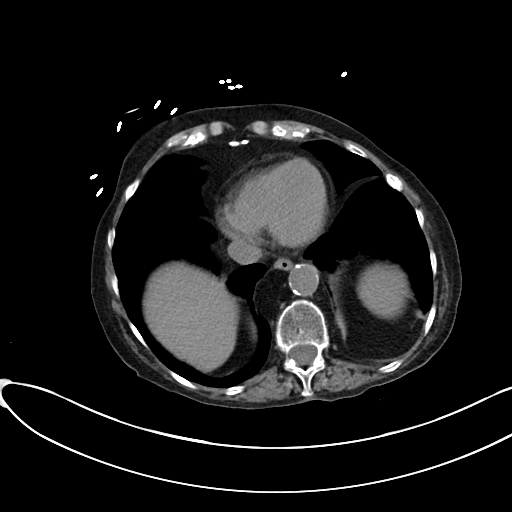
[im 78/83  soft-tissue]
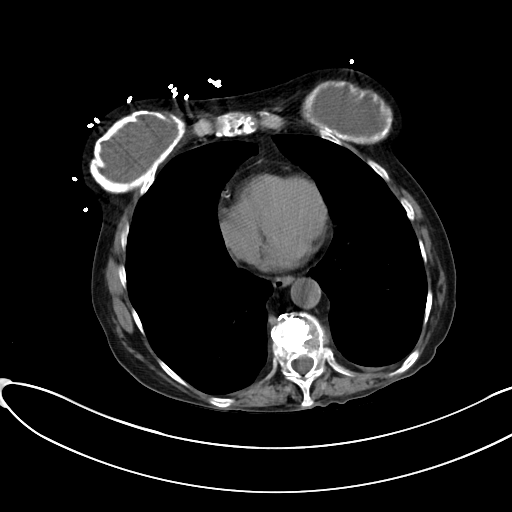

[Series 6: cor · coronal · 0.80mm/px · 3 of 101 slices shown]
[im 34/101  soft-tissue]
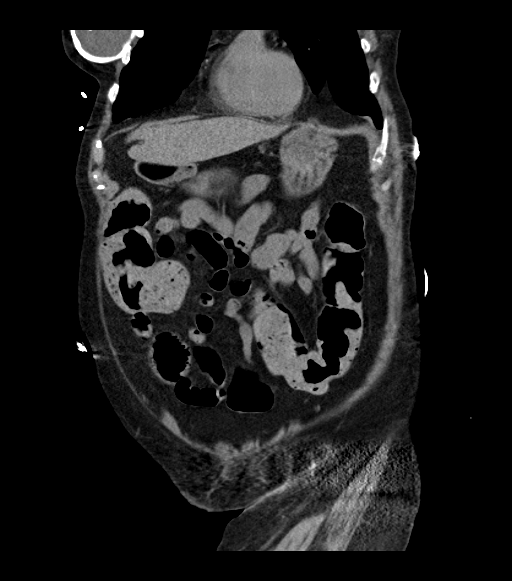
[im 45/101  soft-tissue]
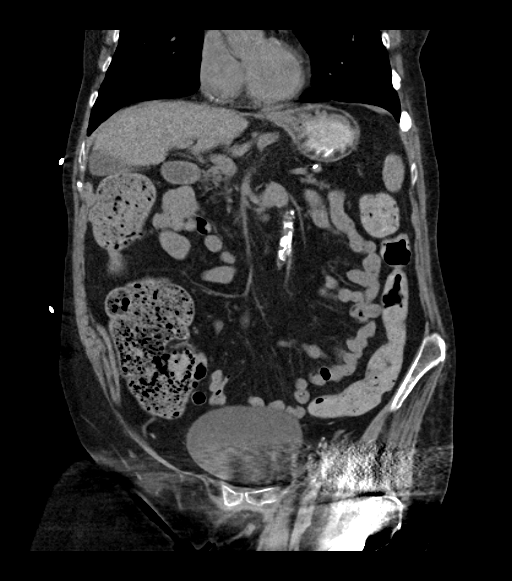
[im 56/101  soft-tissue]
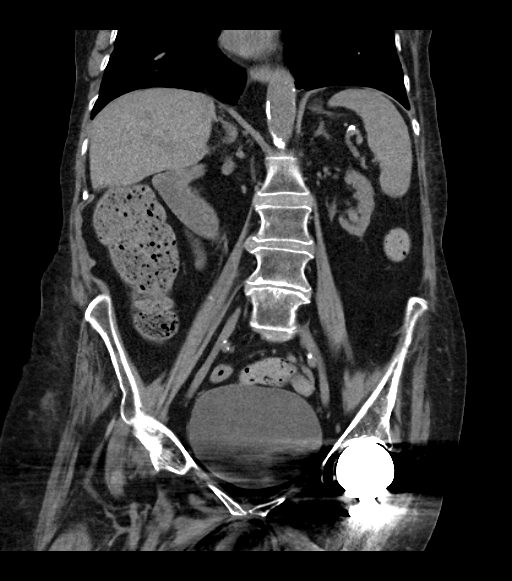

[16 of 46 positions shown; findings below may reference images not displayed]

FINDINGS: LOWER CHEST: Moderate emphysema.

HEPATOBILIARY: The hepatic contours and density are normal. There is
no intra- or extrahepatic biliary dilatation. The gallbladder is
normal.

PANCREAS: The pancreatic parenchymal contours are normal and there
is no ductal dilatation. There is no peripancreatic fluid
collection.

SPLEEN: Normal.

ADRENALS/URINARY TRACT:

--Adrenal glands: Normal.

--Right kidney/ureter: No hydronephrosis, nephroureterolithiasis,
perinephric stranding or solid renal mass.

--Left kidney/ureter: No hydronephrosis, nephroureterolithiasis,
perinephric stranding or solid renal mass.

--Urinary bladder: Normal for degree of distention

STOMACH/BOWEL:

--Stomach/Duodenum: There is no hiatal hernia or other gastric
abnormality. The duodenal course and caliber are normal.

--Small bowel: No dilatation or inflammation.

--Colon: No focal abnormality.

--Appendix: Not visualized. No right lower quadrant inflammation or
free fluid.

VASCULAR/LYMPHATIC: There is aortic atherosclerosis without
hemodynamically significant stenosis. No abdominal or pelvic
lymphadenopathy.

REPRODUCTIVE: Status post hysterectomy. No adnexal mass.

MUSCULOSKELETAL. No bony spinal canal stenosis or focal osseous
abnormality. Left hip arthroplasty.

OTHER: None.
IMPRESSION: No acute abdominal or pelvic abnormality

Aortic Atherosclerosis (PS73X-OJG.G) and Emphysema (PS73X-LSR.7).
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
